# Patient Record
Sex: Male | Born: 1945 | Race: White | Hispanic: No | Marital: Married | Smoking: Former smoker
Health system: Southern US, Community
[De-identification: ages and names within clinical notes are randomized; demographics above are authoritative.]

## PROBLEM LIST (undated history)

## (undated) DIAGNOSIS — K219 Gastro-esophageal reflux disease without esophagitis: Secondary | ICD-10-CM

## (undated) DIAGNOSIS — M1990 Unspecified osteoarthritis, unspecified site: Secondary | ICD-10-CM

## (undated) DIAGNOSIS — A419 Sepsis, unspecified organism: Secondary | ICD-10-CM

## (undated) DIAGNOSIS — IMO0002 Reserved for concepts with insufficient information to code with codable children: Secondary | ICD-10-CM

## (undated) DIAGNOSIS — Z87442 Personal history of urinary calculi: Secondary | ICD-10-CM

## (undated) DIAGNOSIS — F0280 Dementia in other diseases classified elsewhere without behavioral disturbance: Secondary | ICD-10-CM

## (undated) DIAGNOSIS — I739 Peripheral vascular disease, unspecified: Secondary | ICD-10-CM

## (undated) DIAGNOSIS — I7300 Raynaud's syndrome without gangrene: Secondary | ICD-10-CM

## (undated) DIAGNOSIS — R7302 Impaired glucose tolerance (oral): Secondary | ICD-10-CM

## (undated) DIAGNOSIS — M6289 Other specified disorders of muscle: Secondary | ICD-10-CM

## (undated) DIAGNOSIS — G309 Alzheimer's disease, unspecified: Secondary | ICD-10-CM

## (undated) DIAGNOSIS — N390 Urinary tract infection, site not specified: Secondary | ICD-10-CM

## (undated) DIAGNOSIS — I639 Cerebral infarction, unspecified: Secondary | ICD-10-CM

## (undated) DIAGNOSIS — N183 Chronic kidney disease, stage 3 (moderate): Secondary | ICD-10-CM

## (undated) DIAGNOSIS — I10 Essential (primary) hypertension: Secondary | ICD-10-CM

## (undated) DIAGNOSIS — I719 Aortic aneurysm of unspecified site, without rupture: Secondary | ICD-10-CM

## (undated) DIAGNOSIS — C801 Malignant (primary) neoplasm, unspecified: Secondary | ICD-10-CM

## (undated) DIAGNOSIS — J189 Pneumonia, unspecified organism: Secondary | ICD-10-CM

## (undated) DIAGNOSIS — E7800 Pure hypercholesterolemia, unspecified: Secondary | ICD-10-CM

## (undated) DIAGNOSIS — I82432 Acute embolism and thrombosis of left popliteal vein: Secondary | ICD-10-CM

## (undated) HISTORY — DX: Aortic aneurysm of unspecified site, without rupture: I71.9

## (undated) HISTORY — DX: Essential (primary) hypertension: I10

## (undated) HISTORY — DX: Unspecified osteoarthritis, unspecified site: M19.90

## (undated) HISTORY — PX: TONSILLECTOMY: SUR1361

## (undated) HISTORY — PX: PERIPHERALLY INSERTED CENTRAL CATHETER INSERTION: SHX2221

## (undated) HISTORY — DX: Pure hypercholesterolemia, unspecified: E78.00

## (undated) HISTORY — DX: Pneumonia, unspecified organism: J18.9

## (undated) HISTORY — DX: Reserved for concepts with insufficient information to code with codable children: IMO0002

## (undated) HISTORY — PX: UMBILICAL HERNIA REPAIR: SHX196

## (undated) HISTORY — PX: HERNIA REPAIR: SHX51

## (undated) HISTORY — DX: Malignant (primary) neoplasm, unspecified: C80.1

## (undated) HISTORY — DX: Chronic kidney disease, stage 3 (moderate): N18.3

---

## 2004-09-15 ENCOUNTER — Ambulatory Visit: Payer: Self-pay | Admitting: Family Medicine

## 2009-09-26 HISTORY — PX: ABDOMINAL AORTIC ANEURYSM REPAIR: SUR1152

## 2010-08-26 ENCOUNTER — Inpatient Hospital Stay: Payer: Self-pay | Admitting: Vascular Surgery

## 2010-09-26 DIAGNOSIS — C801 Malignant (primary) neoplasm, unspecified: Secondary | ICD-10-CM

## 2010-09-26 HISTORY — DX: Malignant (primary) neoplasm, unspecified: C80.1

## 2011-01-06 IMAGING — CT CT CHEST-ABD-PELV W/ CM
1 of 2 series · 13 of 32 positions shown, 18 images · IV contrast (APPLIED)
Comparison: none

REASON FOR EXAM: (1) hypotension abd pain; (2) hypotension abd pain
COMMENTS:

[Series 10: soft tissue · axial · 0.90mm/px · z∈[+82,+706]mm · 13 of 236 slices shown, 18 images]
[im 14/236  soft-tissue]
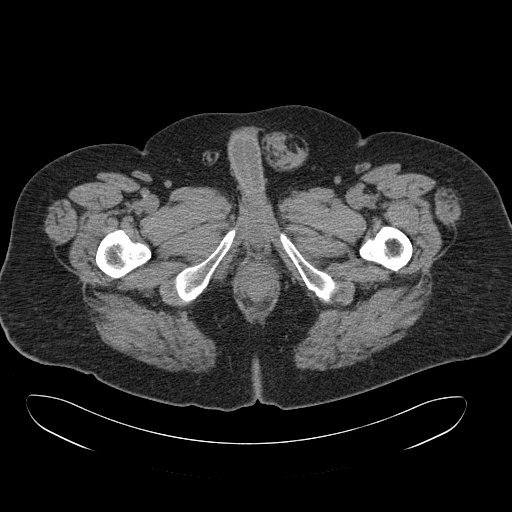
[im 14/236  bone]
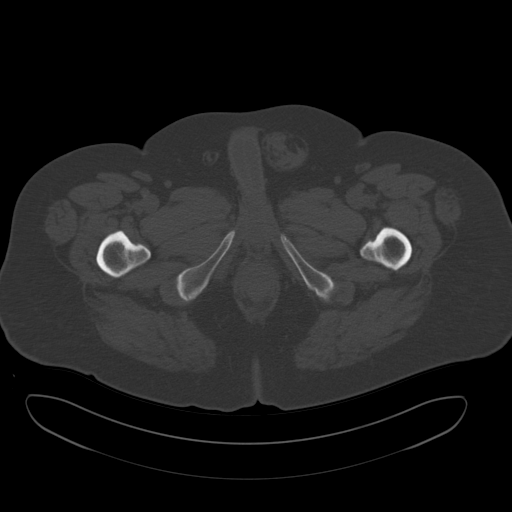
[im 40/236  soft-tissue]
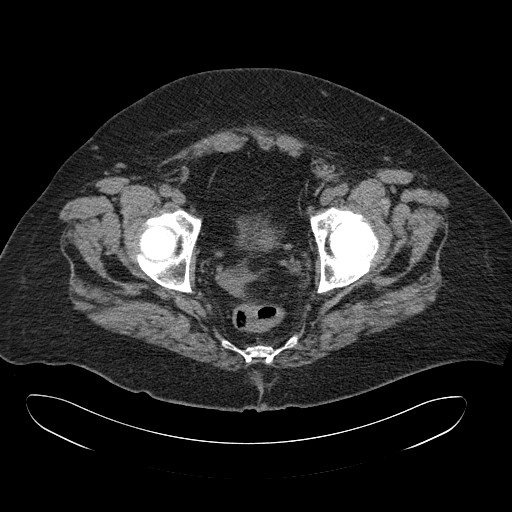
[im 53/236  soft-tissue]
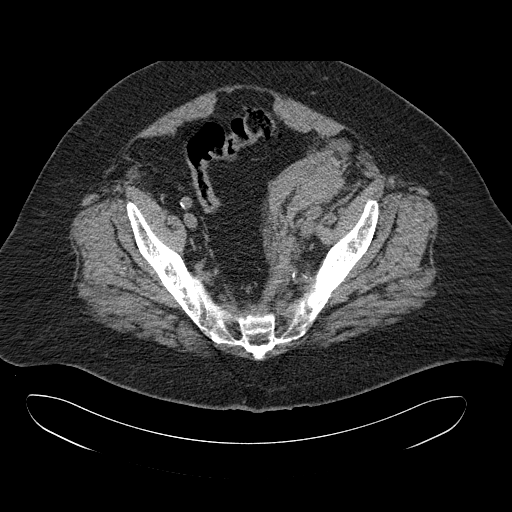
[im 66/236  soft-tissue]
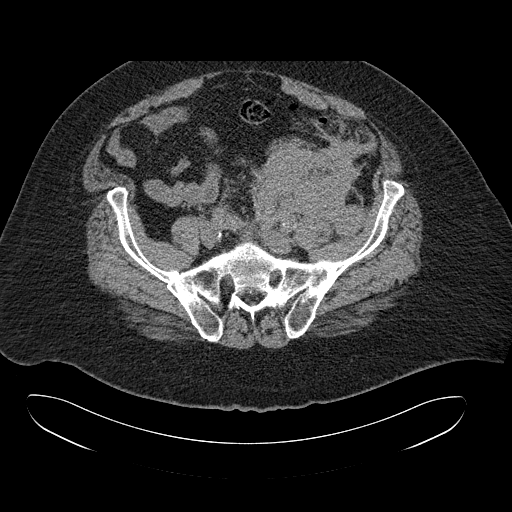
[im 92/236  soft-tissue]
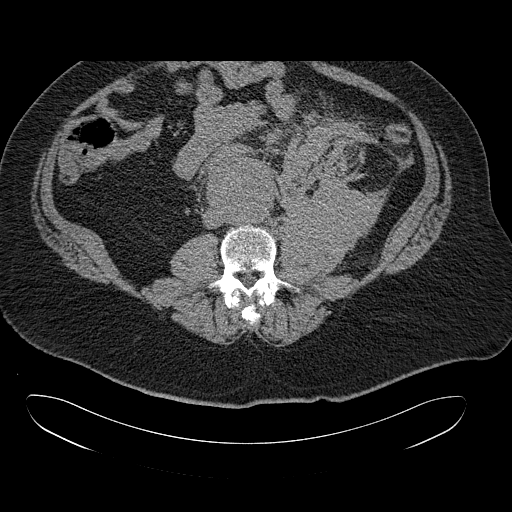
[im 105/236  soft-tissue]
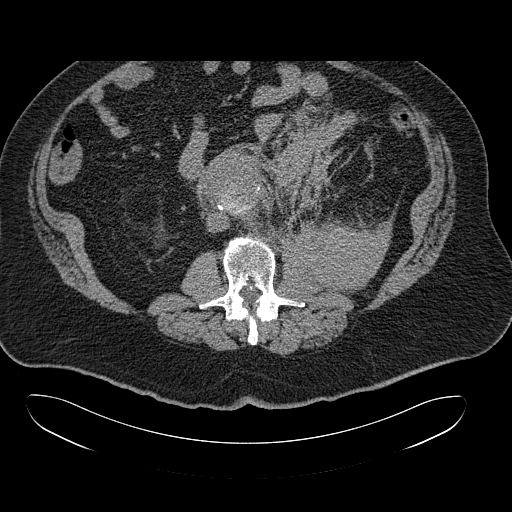
[im 131/236  soft-tissue]
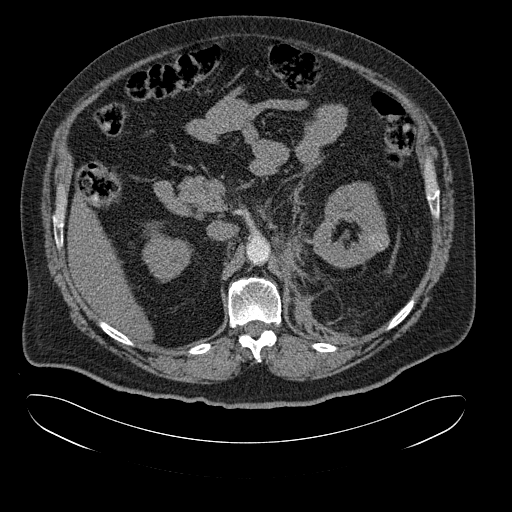
[im 144/236  soft-tissue]
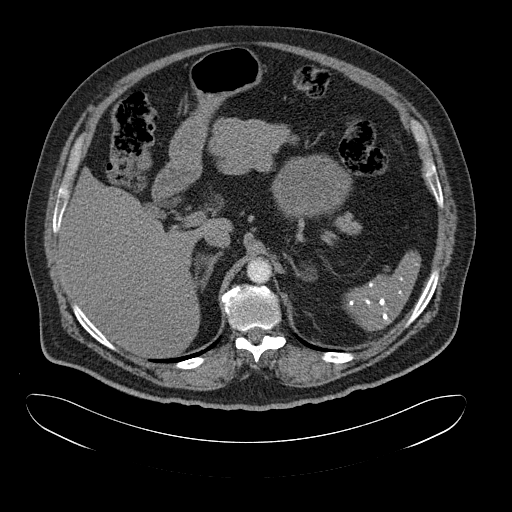
[im 170/236  soft-tissue]
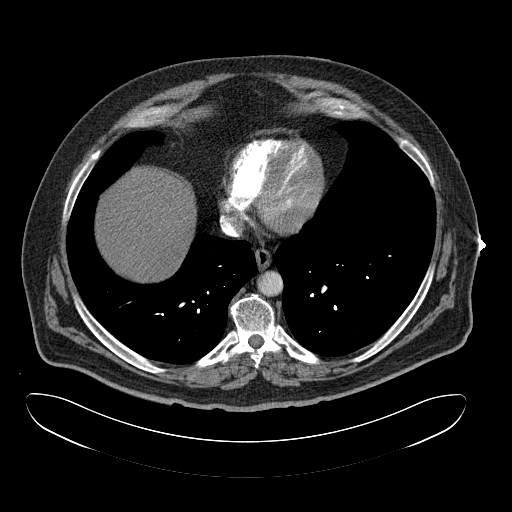
[im 170/236  bone]
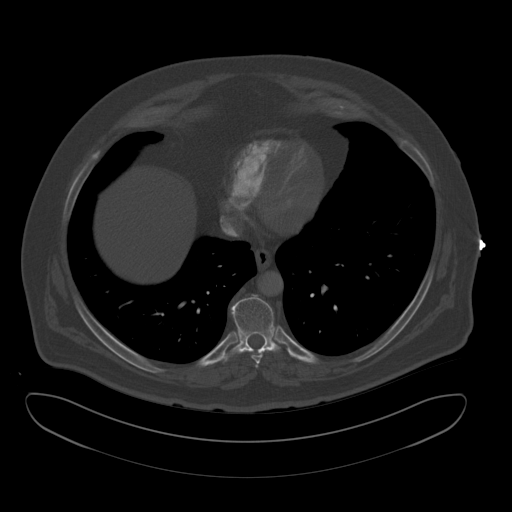
[im 183/236  soft-tissue]
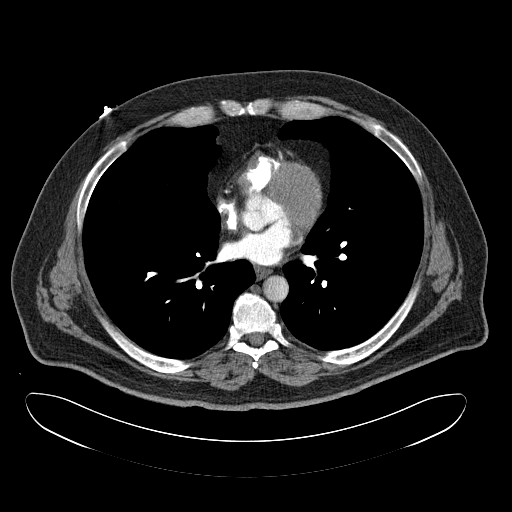
[im 183/236  lung]
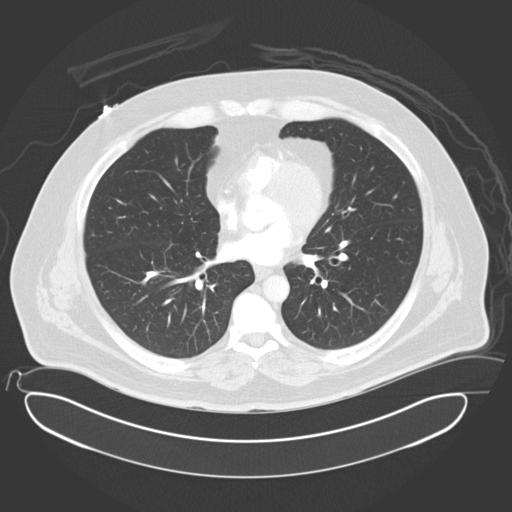
[im 196/236  soft-tissue]
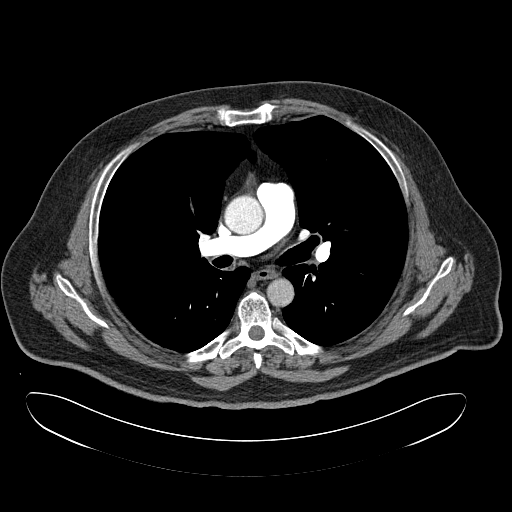
[im 196/236  lung]
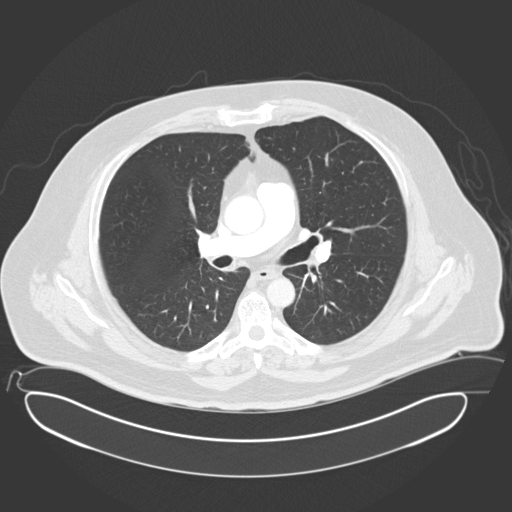
[im 209/236  lung]
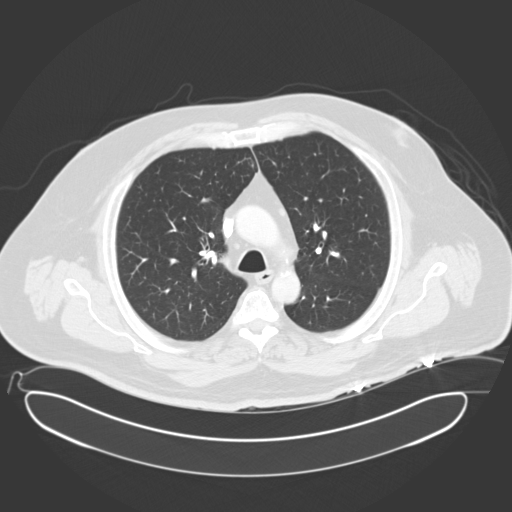
[im 222/236  soft-tissue]
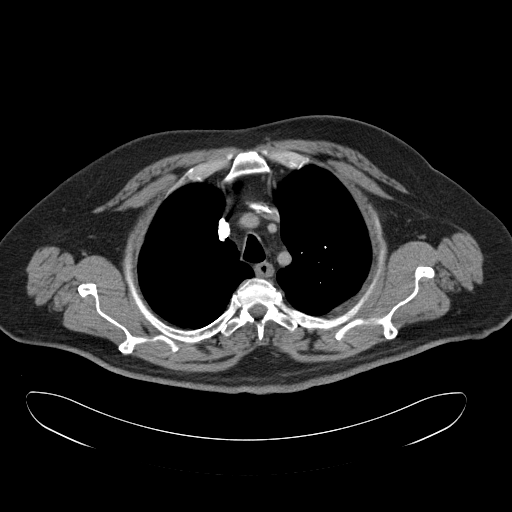
[im 222/236  lung]
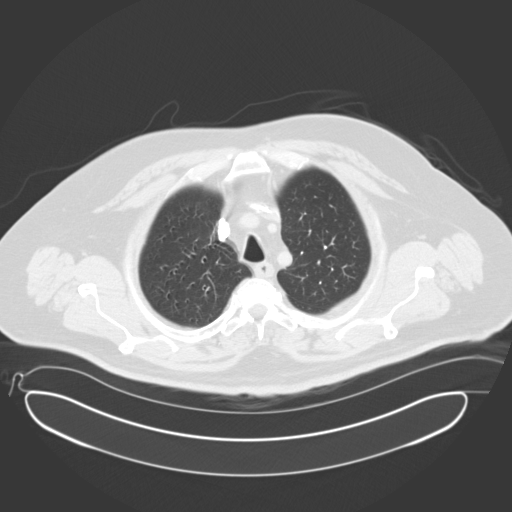

[13 of 32 positions shown; findings below may reference images not displayed]

PROCEDURE:     CT  - CT CHEST ABDOMEN AND PELVIS W  - August 26, 2010 [DATE]

RESULT:     Axial CT scanning was performed through the chest, abdomen, and
pelvis at 5 mm intervals and slice thicknesses following intravenous
administration of 100 cc of [id number]. Review of multiplanar reconstructed
images was performed separately on the VIA monitor.

CT scan of the chest: The caliber of the thoracic aorta is normal. No false
lumen is identified. The cardiac chambers are normal in size. There is no
pleural nor pericardial effusion. There is no mediastinal or hilar
lymphadenopathy. The pulmonary parenchyma exhibits no abnormal masses or
nodules or evidence of infiltrate. The thoracic vertebral bodies are
preserved in height.
CONCLUSION: I do not see acute abnormality within the thorax.

CT scan of the abdomen and pelvis: There is a 6 cm diameter leaking
infrarenal abdominal aortic aneurysm. There is a considerable amount of
noncontrasted blood within the retroperitoneum on the left. This lies
anterior to the psoas muscle and medial and posterior to the left kidney
displacing the left kidney slightly anteriorly. The blood extends toward the
right anterior to the aneurysm and upper abdominal aorta. The aneurysm
extends to the bifurcation and involves the very proximal portion of both
iliac arteries. More distally the iliac arteries are normal in caliber.

The liver exhibits no focal mass or ductal dilation. There is a tiny stone
in the gallbladder neck appear There are numerous calcifications within the
normal sized spleen. The stomach, pancreas, and adrenal glands are within
the limits of normal. No acute abnormality of the bowel is demonstrated. The
prostate gland and collapsed urinary bladder are grossly normal. There is a
small amount of fluid in the pelvis. There is a left inguinal hernia which
contains fluid which appears to have tracked from the retroperitoneum into
the hernia extending into the scrotum.

The kidneys still enhance well. The origin of the renal arteries is just
above the aneurysm. There is a hyperdense focus in the midpole of the cortex
of the left kidney with Hounsfield measurement of +58. On the right there
are hypodensities in the midpole one of which measures 2 cm in greatest
dimension and has Hounsfield measurement of +3.
IMPRESSION: 1. There is a leaking infrarenal abdominal aortic aneurysm measuring 6 cm in
diameter. Fluid is present in the retroperitoneum and extends into the
pelvis and into the right inguinal canal via a hernia.
2. The kidneys still enhance reasonably well. There is a hypodense 1.8 cm
diameter focus in the cortex of the left kidney. There are numerous
nonobstructing parenchymal or collecting system calcifications in the left
kidney. There is a tiny gallstone near the gallbladder neck.
3. I do not see acute abnormality elsewhere within the abdomen or pelvis.
4. I see no acute abnormality within the thorax.

A preliminary report was called by me directly to Dr. Jumper in the emergency
department at approximately [DATE] on 26 August, 2010.

## 2011-08-10 ENCOUNTER — Ambulatory Visit: Payer: Self-pay | Admitting: Urology

## 2011-08-24 ENCOUNTER — Ambulatory Visit: Payer: Self-pay | Admitting: Urology

## 2011-08-24 DIAGNOSIS — Z01810 Encounter for preprocedural cardiovascular examination: Secondary | ICD-10-CM

## 2011-08-29 ENCOUNTER — Ambulatory Visit (INDEPENDENT_AMBULATORY_CARE_PROVIDER_SITE_OTHER): Payer: Managed Care, Other (non HMO) | Admitting: Cardiovascular Disease

## 2011-08-29 ENCOUNTER — Encounter: Payer: Self-pay | Admitting: Cardiovascular Disease

## 2011-08-29 VITALS — BP 122/68 | HR 84 | Ht 71.0 in | Wt 254.5 lb

## 2011-08-29 DIAGNOSIS — I169 Hypertensive crisis, unspecified: Secondary | ICD-10-CM | POA: Insufficient documentation

## 2011-08-29 DIAGNOSIS — E785 Hyperlipidemia, unspecified: Secondary | ICD-10-CM

## 2011-08-29 DIAGNOSIS — Z01810 Encounter for preprocedural cardiovascular examination: Secondary | ICD-10-CM

## 2011-08-29 DIAGNOSIS — E7800 Pure hypercholesterolemia, unspecified: Secondary | ICD-10-CM | POA: Insufficient documentation

## 2011-08-29 DIAGNOSIS — R5381 Other malaise: Secondary | ICD-10-CM

## 2011-08-29 DIAGNOSIS — I739 Peripheral vascular disease, unspecified: Secondary | ICD-10-CM

## 2011-08-29 DIAGNOSIS — R9431 Abnormal electrocardiogram [ECG] [EKG]: Secondary | ICD-10-CM | POA: Insufficient documentation

## 2011-08-29 DIAGNOSIS — I10 Essential (primary) hypertension: Secondary | ICD-10-CM

## 2011-08-29 DIAGNOSIS — Z87891 Personal history of nicotine dependence: Secondary | ICD-10-CM

## 2011-08-29 NOTE — Progress Notes (Signed)
Patient ID: Nathan Rivera, male    DOB: Mar 25, 1946, 65 y.o.   MRN: [id number]  HPI Comments: Nathan Rivera is a very pleasant 65 year old gentleman with a history of peripheral vascular disease, aorto bifemoral endograft placement for AAA, history of hematuria and bladder infection, smoking for 45 years, chronic back and knee pain with no known coronary artery disease, history of renal dysfunction followed by Dr. Cherylann Ratel, who presents by referral for preoperative evaluation prior to cystoscopy.  He reports that he is very active at baseline, plays golf 3 days per week. He is able to do most of his chores. He is retired and his wife continues to work and he maintains his house, garden and mows 2 acres at a time. He denies any significant shortness of breath or chest pain. He does have a vague sense of malaise which he attributes to his kidneys, bladder, possibly a retained stone though he is unsure. He is hoping a cystoscopy we'll provide some clearance.   He reports having recent ABIs were within normal limits. This was performed by Dr. Lorretta Harp. He has not had a recent stress test.  EKG shows normal sinus rhythm with rate 84 beats per minute with no significant ST or T wave changes, poor R wave progression through the anterior precordial leads. EKG today is unchanged from prior EKGs performed in the hospital as well as last year in December 2011   Outpatient Encounter Prescriptions as of 08/29/2011  Medication Sig Dispense Refill  . amLODipine-olmesartan (AZOR) 5-40 MG per tablet Take 1 tablet by mouth daily.        . simvastatin (ZOCOR) 20 MG tablet Take 20 mg by mouth at bedtime.        Marland Kitchen DISCONTD: etodolac (LODINE) 500 MG tablet Take 500 mg by mouth 2 (two) times daily.           Review of Systems  Constitutional: Negative.   HENT: Negative.   Eyes: Negative.   Respiratory: Negative.   Cardiovascular: Negative.   Gastrointestinal: Negative.   Musculoskeletal: Positive for back pain and  arthralgias.  Skin: Negative.   Neurological: Negative.   Hematological: Negative.   Psychiatric/Behavioral: Negative.   All other systems reviewed and are negative.    BP 122/68  Pulse 84  Ht 5\' 11"  (1.803 m)  Wt 254 lb 8 oz (115.44 kg)  BMI 35.50 kg/m2   Physical Exam  Nursing note and vitals reviewed. Constitutional: He is oriented to person, place, and time. He appears well-developed and well-nourished.  HENT:  Head: Normocephalic.  Nose: Nose normal.  Mouth/Throat: Oropharynx is clear and moist.  Eyes: Conjunctivae are normal. Pupils are equal, round, and reactive to light.  Neck: Normal range of motion. Neck supple. No JVD present.  Cardiovascular: Normal rate, regular rhythm, S1 normal, S2 normal, normal heart sounds and intact distal pulses.  Exam reveals no gallop and no friction rub.   No murmur heard. Pulmonary/Chest: Effort normal and breath sounds normal. No respiratory distress. He has no wheezes. He has no rales. He exhibits no tenderness.  Abdominal: Soft. Bowel sounds are normal. He exhibits no distension. There is no tenderness.  Musculoskeletal: Normal range of motion. He exhibits no edema and no tenderness.  Lymphadenopathy:    He has no cervical adenopathy.  Neurological: He is alert and oriented to person, place, and time. Coordination normal.  Skin: Skin is warm and dry. No rash noted. No erythema.  Psychiatric: He has a normal mood and affect. His behavior  is normal. Judgment and thought content normal.           Assessment and Plan

## 2011-08-29 NOTE — Assessment & Plan Note (Signed)
EKG is relatively unchanged from prior studies. Poor R-wave progression through the precordial disease more likely than old MI.

## 2011-08-29 NOTE — Assessment & Plan Note (Signed)
History of aortobifem stenting per the patient. We have encouraged continued smoking cessation and cholesterol control.

## 2011-08-29 NOTE — Assessment & Plan Note (Signed)
Blood pressure is well controlled on today's visit. No changes made to the medications. 

## 2011-08-29 NOTE — Assessment & Plan Note (Signed)
Given his peripheral vascular disease, goal LDL should be less than 70.

## 2011-08-29 NOTE — Patient Instructions (Signed)
You are doing well. No medication changes were made.  Please call us if you have new issues that need to be addressed before your next appt.    

## 2011-08-29 NOTE — Assessment & Plan Note (Signed)
Given that he has a reasonable exercise tolerance with no symptoms, no further workup is needed at this time prior to his cystoscopy. He plays golf, maintains his property with no symptoms and would be expected to handle the stress of surgery without complications.

## 2011-09-07 ENCOUNTER — Ambulatory Visit: Payer: Self-pay | Admitting: Urology

## 2012-01-11 ENCOUNTER — Ambulatory Visit: Payer: Self-pay | Admitting: Urology

## 2012-01-18 ENCOUNTER — Ambulatory Visit: Payer: Self-pay | Admitting: Urology

## 2012-01-20 LAB — PATHOLOGY REPORT

## 2012-02-22 ENCOUNTER — Ambulatory Visit: Payer: Self-pay | Admitting: Family Medicine

## 2012-09-26 DIAGNOSIS — N183 Chronic kidney disease, stage 3 unspecified: Secondary | ICD-10-CM

## 2012-09-26 HISTORY — DX: Chronic kidney disease, stage 3 unspecified: N18.30

## 2013-02-04 ENCOUNTER — Ambulatory Visit: Payer: Self-pay | Admitting: Vascular Surgery

## 2013-07-27 DIAGNOSIS — A419 Sepsis, unspecified organism: Secondary | ICD-10-CM

## 2013-07-27 HISTORY — DX: Sepsis, unspecified organism: A41.9

## 2013-07-27 HISTORY — PX: NEPHROSTOMY: SHX1014

## 2013-08-17 ENCOUNTER — Inpatient Hospital Stay: Payer: Self-pay | Admitting: Student

## 2013-08-17 DIAGNOSIS — N390 Urinary tract infection, site not specified: Secondary | ICD-10-CM

## 2013-08-17 HISTORY — DX: Urinary tract infection, site not specified: N39.0

## 2013-08-17 LAB — COMPREHENSIVE METABOLIC PANEL
Albumin: 2 g/dL — ABNORMAL LOW (ref 3.4–5.0)
Alkaline Phosphatase: 495 U/L — ABNORMAL HIGH
Anion Gap: 13 (ref 7–16)
Calcium, Total: 8.7 mg/dL (ref 8.5–10.1)
EGFR (African American): 7 — ABNORMAL LOW
EGFR (Non-African Amer.): 6 — ABNORMAL LOW
Potassium: 5.6 mmol/L — ABNORMAL HIGH (ref 3.5–5.1)
Total Protein: 6.5 g/dL (ref 6.4–8.2)

## 2013-08-17 LAB — URINALYSIS, COMPLETE
Bilirubin,UR: NEGATIVE
Glucose,UR: NEGATIVE mg/dL (ref 0–75)
Ketone: NEGATIVE
Ph: 5 (ref 4.5–8.0)
Protein: 100
RBC,UR: 895 /HPF (ref 0–5)
Squamous Epithelial: NONE SEEN
WBC UR: 1471 /HPF (ref 0–5)

## 2013-08-17 LAB — CBC WITH DIFFERENTIAL/PLATELET
Basophil #: 0 10*3/uL (ref 0.0–0.1)
Basophil %: 0.1 %
HGB: 13 g/dL (ref 13.0–18.0)
Lymphocyte %: 4 %
Monocyte #: 0.1 x10 3/mm — ABNORMAL LOW (ref 0.2–1.0)
Monocyte %: 0.3 %
Neutrophil %: 95.5 %
Platelet: 182 10*3/uL (ref 150–440)
RDW: 16.7 % — ABNORMAL HIGH (ref 11.5–14.5)

## 2013-08-17 LAB — TROPONIN I: Troponin-I: <0.02 ng/mL

## 2013-08-18 ENCOUNTER — Inpatient Hospital Stay (HOSPITAL_COMMUNITY)
Admission: AD | Admit: 2013-08-18 | Discharge: 2013-09-04 | DRG: 853 | Disposition: A | Discharge status: Home Health | Payer: Medicare Other | Source: Other Acute Inpatient Hospital | Attending: Internal Medicine | Admitting: Internal Medicine

## 2013-08-18 ENCOUNTER — Ambulatory Visit: Payer: Self-pay | Admitting: Urology

## 2013-08-18 ENCOUNTER — Encounter (HOSPITAL_COMMUNITY): Payer: Self-pay | Admitting: *Deleted

## 2013-08-18 ENCOUNTER — Inpatient Hospital Stay (HOSPITAL_COMMUNITY): Payer: Medicare Other

## 2013-08-18 DIAGNOSIS — K429 Umbilical hernia without obstruction or gangrene: Secondary | ICD-10-CM | POA: Diagnosis present

## 2013-08-18 DIAGNOSIS — J811 Chronic pulmonary edema: Secondary | ICD-10-CM | POA: Diagnosis present

## 2013-08-18 DIAGNOSIS — N200 Calculus of kidney: Secondary | ICD-10-CM

## 2013-08-18 DIAGNOSIS — I959 Hypotension, unspecified: Secondary | ICD-10-CM | POA: Insufficient documentation

## 2013-08-18 DIAGNOSIS — E871 Hypo-osmolality and hyponatremia: Secondary | ICD-10-CM | POA: Diagnosis not present

## 2013-08-18 DIAGNOSIS — R000 Tachycardia, unspecified: Secondary | ICD-10-CM | POA: Diagnosis not present

## 2013-08-18 DIAGNOSIS — Z01810 Encounter for preprocedural cardiovascular examination: Secondary | ICD-10-CM

## 2013-08-18 DIAGNOSIS — N1330 Unspecified hydronephrosis: Secondary | ICD-10-CM | POA: Diagnosis present

## 2013-08-18 DIAGNOSIS — I129 Hypertensive chronic kidney disease with stage 1 through stage 4 chronic kidney disease, or unspecified chronic kidney disease: Secondary | ICD-10-CM | POA: Diagnosis present

## 2013-08-18 DIAGNOSIS — F17200 Nicotine dependence, unspecified, uncomplicated: Secondary | ICD-10-CM | POA: Diagnosis present

## 2013-08-18 DIAGNOSIS — N183 Chronic kidney disease, stage 3 unspecified: Secondary | ICD-10-CM | POA: Diagnosis present

## 2013-08-18 DIAGNOSIS — R5381 Other malaise: Secondary | ICD-10-CM

## 2013-08-18 DIAGNOSIS — R6520 Severe sepsis without septic shock: Secondary | ICD-10-CM

## 2013-08-18 DIAGNOSIS — K560 Paralytic ileus: Secondary | ICD-10-CM | POA: Diagnosis not present

## 2013-08-18 DIAGNOSIS — Z79899 Other long term (current) drug therapy: Secondary | ICD-10-CM

## 2013-08-18 DIAGNOSIS — E46 Unspecified protein-calorie malnutrition: Secondary | ICD-10-CM | POA: Diagnosis present

## 2013-08-18 DIAGNOSIS — M1990 Unspecified osteoarthritis, unspecified site: Secondary | ICD-10-CM | POA: Diagnosis present

## 2013-08-18 DIAGNOSIS — Z418 Encounter for other procedures for purposes other than remedying health state: Secondary | ICD-10-CM

## 2013-08-18 DIAGNOSIS — E785 Hyperlipidemia, unspecified: Secondary | ICD-10-CM

## 2013-08-18 DIAGNOSIS — E860 Dehydration: Secondary | ICD-10-CM

## 2013-08-18 DIAGNOSIS — E875 Hyperkalemia: Secondary | ICD-10-CM | POA: Diagnosis not present

## 2013-08-18 DIAGNOSIS — E872 Acidosis, unspecified: Secondary | ICD-10-CM | POA: Diagnosis present

## 2013-08-18 DIAGNOSIS — Z87442 Personal history of urinary calculi: Secondary | ICD-10-CM

## 2013-08-18 DIAGNOSIS — I10 Essential (primary) hypertension: Secondary | ICD-10-CM | POA: Diagnosis present

## 2013-08-18 DIAGNOSIS — K265 Chronic or unspecified duodenal ulcer with perforation: Secondary | ICD-10-CM

## 2013-08-18 DIAGNOSIS — N179 Acute kidney failure, unspecified: Secondary | ICD-10-CM | POA: Diagnosis present

## 2013-08-18 DIAGNOSIS — R7881 Bacteremia: Secondary | ICD-10-CM

## 2013-08-18 DIAGNOSIS — N201 Calculus of ureter: Secondary | ICD-10-CM | POA: Diagnosis present

## 2013-08-18 DIAGNOSIS — D72829 Elevated white blood cell count, unspecified: Secondary | ICD-10-CM

## 2013-08-18 DIAGNOSIS — N151 Renal and perinephric abscess: Secondary | ICD-10-CM

## 2013-08-18 DIAGNOSIS — N12 Tubulo-interstitial nephritis, not specified as acute or chronic: Secondary | ICD-10-CM | POA: Diagnosis present

## 2013-08-18 DIAGNOSIS — Z8551 Personal history of malignant neoplasm of bladder: Secondary | ICD-10-CM

## 2013-08-18 DIAGNOSIS — E8779 Other fluid overload: Secondary | ICD-10-CM | POA: Diagnosis not present

## 2013-08-18 DIAGNOSIS — J9601 Acute respiratory failure with hypoxia: Secondary | ICD-10-CM

## 2013-08-18 DIAGNOSIS — A419 Sepsis, unspecified organism: Secondary | ICD-10-CM

## 2013-08-18 DIAGNOSIS — E7800 Pure hypercholesterolemia, unspecified: Secondary | ICD-10-CM | POA: Diagnosis present

## 2013-08-18 DIAGNOSIS — E870 Hyperosmolality and hypernatremia: Secondary | ICD-10-CM | POA: Diagnosis not present

## 2013-08-18 DIAGNOSIS — Z113 Encounter for screening for infections with a predominantly sexual mode of transmission: Secondary | ICD-10-CM

## 2013-08-18 DIAGNOSIS — Z823 Family history of stroke: Secondary | ICD-10-CM

## 2013-08-18 DIAGNOSIS — I169 Hypertensive crisis, unspecified: Secondary | ICD-10-CM | POA: Diagnosis present

## 2013-08-18 DIAGNOSIS — R579 Shock, unspecified: Secondary | ICD-10-CM

## 2013-08-18 DIAGNOSIS — K9189 Other postprocedural complications and disorders of digestive system: Secondary | ICD-10-CM

## 2013-08-18 DIAGNOSIS — K567 Ileus, unspecified: Secondary | ICD-10-CM

## 2013-08-18 DIAGNOSIS — A499 Bacterial infection, unspecified: Secondary | ICD-10-CM

## 2013-08-18 DIAGNOSIS — B9620 Unspecified Escherichia coli [E. coli] as the cause of diseases classified elsewhere: Secondary | ICD-10-CM

## 2013-08-18 DIAGNOSIS — I739 Peripheral vascular disease, unspecified: Secondary | ICD-10-CM | POA: Diagnosis present

## 2013-08-18 DIAGNOSIS — Z2989 Encounter for other specified prophylactic measures: Secondary | ICD-10-CM

## 2013-08-18 DIAGNOSIS — R9431 Abnormal electrocardiogram [ECG] [EKG]: Secondary | ICD-10-CM

## 2013-08-18 DIAGNOSIS — J9600 Acute respiratory failure, unspecified whether with hypoxia or hypercapnia: Secondary | ICD-10-CM | POA: Diagnosis not present

## 2013-08-18 DIAGNOSIS — A4151 Sepsis due to Escherichia coli [E. coli]: Principal | ICD-10-CM | POA: Diagnosis present

## 2013-08-18 DIAGNOSIS — F29 Unspecified psychosis not due to a substance or known physiological condition: Secondary | ICD-10-CM | POA: Diagnosis present

## 2013-08-18 DIAGNOSIS — D62 Acute posthemorrhagic anemia: Secondary | ICD-10-CM | POA: Diagnosis not present

## 2013-08-18 LAB — URINALYSIS, ROUTINE W REFLEX MICROSCOPIC
Glucose, UA: NEGATIVE mg/dL
Ketones, ur: NEGATIVE mg/dL
Nitrite: POSITIVE — AB
Protein, ur: 30 mg/dL — AB
Urobilinogen, UA: 1 mg/dL (ref 0.0–1.0)
pH: 5.5 (ref 5.0–8.0)

## 2013-08-18 LAB — GLUCOSE, CAPILLARY
Glucose-Capillary: 63 mg/dL — ABNORMAL LOW (ref 70–99)
Glucose-Capillary: 76 mg/dL (ref 70–99)

## 2013-08-18 LAB — URINE MICROSCOPIC-ADD ON

## 2013-08-18 LAB — COMPREHENSIVE METABOLIC PANEL
Albumin: 1.4 g/dL — ABNORMAL LOW (ref 3.4–5.0)
Anion Gap: 9 (ref 7–16)
BUN: 127 mg/dL — ABNORMAL HIGH (ref 7–18)
Calcium, Total: 8.3 mg/dL — ABNORMAL LOW (ref 8.5–10.1)
Co2: 14 mmol/L — ABNORMAL LOW (ref 21–32)
Creatinine: 6.81 mg/dL — ABNORMAL HIGH (ref 0.60–1.30)
Osmolality: 312 (ref 275–301)
SGOT(AST): 71 U/L — ABNORMAL HIGH (ref 15–37)
SGPT (ALT): 57 U/L (ref 12–78)
Total Protein: 5.8 g/dL — ABNORMAL LOW (ref 6.4–8.2)

## 2013-08-18 LAB — CBC WITH DIFFERENTIAL/PLATELET
Basophil %: 0.1 %
Eosinophil #: 0.2 10*3/uL (ref 0.0–0.7)
HGB: 10.3 g/dL — ABNORMAL LOW (ref 13.0–18.0)
Lymphocyte #: 2.3 10*3/uL (ref 1.0–3.6)
MCHC: 33.5 g/dL (ref 32.0–36.0)
Monocyte #: 1.1 x10 3/mm — ABNORMAL HIGH (ref 0.2–1.0)
Monocyte %: 2.4 %
Neutrophil #: 40 10*3/uL — ABNORMAL HIGH (ref 1.4–6.5)
Neutrophil %: 91.6 %
Platelet: 161 10*3/uL (ref 150–440)
RBC: 3.59 10*6/uL — ABNORMAL LOW (ref 4.40–5.90)
RDW: 16 % — ABNORMAL HIGH (ref 11.5–14.5)
WBC: 43.6 10*3/uL — ABNORMAL HIGH (ref 3.8–10.6)

## 2013-08-18 LAB — TYPE AND SCREEN: Antibody Screen: NEGATIVE

## 2013-08-18 LAB — CBC
Hemoglobin: 10.9 g/dL — ABNORMAL LOW (ref 13.0–17.0)
MCH: 29.3 pg (ref 26.0–34.0)
MCHC: 34.4 g/dL (ref 30.0–36.0)
MCV: 85.2 fL (ref 78.0–100.0)
RBC: 3.72 MIL/uL — ABNORMAL LOW (ref 4.22–5.81)

## 2013-08-18 LAB — BASIC METABOLIC PANEL
CO2: 10 mEq/L — CL (ref 19–32)
Calcium: 8.1 mg/dL — ABNORMAL LOW (ref 8.4–10.5)
Chloride: 111 mEq/L (ref 96–112)
Creatinine, Ser: 5.23 mg/dL — ABNORMAL HIGH (ref 0.50–1.35)
GFR calc Af Amer: 12 mL/min — ABNORMAL LOW (ref >90)
Glucose, Bld: 81 mg/dL (ref 70–99)

## 2013-08-18 LAB — LACTIC ACID, PLASMA: Lactic Acid, Venous: 0.8 mmol/L (ref 0.5–2.2)

## 2013-08-18 LAB — FIBRINOGEN: Fibrinogen: 707 mg/dL — ABNORMAL HIGH (ref 204–475)

## 2013-08-18 LAB — PROTIME-INR
INR: 1.16 (ref 0.00–1.49)
Prothrombin Time: 14.6 seconds (ref 11.6–15.2)

## 2013-08-18 LAB — MRSA PCR SCREENING: MRSA by PCR: NEGATIVE

## 2013-08-18 LAB — CARBOXYHEMOGLOBIN
Methemoglobin: 1.5 % (ref 0.0–1.5)
O2 Saturation: 78.7 %
Total hemoglobin: 10.9 g/dL — ABNORMAL LOW (ref 13.5–18.0)

## 2013-08-18 MED ORDER — SODIUM BICARBONATE 8.4 % IV SOLN
50.0000 meq | Freq: Once | INTRAVENOUS | Status: AC
  Administered 2013-08-18: 50 meq via INTRAVENOUS
  Filled 2013-08-18: qty 50

## 2013-08-18 MED ORDER — SODIUM CHLORIDE 0.9 % IV BOLUS (SEPSIS)
1000.0000 mL | INTRAVENOUS | Status: DC | PRN
  Administered 2013-08-18: 500 mL via INTRAVENOUS
  Administered 2013-08-18: 1000 mL via INTRAVENOUS

## 2013-08-18 MED ORDER — SODIUM CHLORIDE 0.9 % IJ SOLN
3.0000 mL | Freq: Two times a day (BID) | INTRAMUSCULAR | Status: DC
  Administered 2013-08-18 – 2013-08-19 (×3): 3 mL via INTRAVENOUS

## 2013-08-18 MED ORDER — DEXTROSE 5 % IV SOLN: 1.0000 g | Freq: Two times a day (BID) | INTRAVENOUS | Status: DC

## 2013-08-18 MED ORDER — SODIUM CHLORIDE 0.9 % IV SOLN
INTRAVENOUS | Status: DC
  Administered 2013-08-18 – 2013-08-22 (×7): via INTRAVENOUS

## 2013-08-18 MED ORDER — SODIUM CHLORIDE 0.9 % IV BOLUS (SEPSIS): 1000.0000 mL | INTRAVENOUS | Status: DC | PRN

## 2013-08-18 MED ORDER — MEPERIDINE HCL 50 MG/ML IJ SOLN
INTRAMUSCULAR | Status: AC
  Filled 2013-08-18: qty 1

## 2013-08-18 MED ORDER — FENTANYL CITRATE 0.05 MG/ML IJ SOLN
INTRAMUSCULAR | Status: AC | PRN
  Administered 2013-08-18: 25 ug via INTRAVENOUS

## 2013-08-18 MED ORDER — ONDANSETRON HCL 4 MG/2ML IJ SOLN
4.0000 mg | Freq: Four times a day (QID) | INTRAMUSCULAR | Status: DC | PRN
  Administered 2013-08-19 – 2013-08-22 (×2): 4 mg via INTRAVENOUS
  Filled 2013-08-18: qty 2

## 2013-08-18 MED ORDER — ACETAMINOPHEN 650 MG RE SUPP: 650.0000 mg | Freq: Four times a day (QID) | RECTAL | Status: DC | PRN

## 2013-08-18 MED ORDER — DEXTROSE 5 % IV SOLN
1.0000 g | INTRAVENOUS | Status: DC
  Administered 2013-08-18 – 2013-08-19 (×2): 1 g via INTRAVENOUS
  Filled 2013-08-18 (×3): qty 1

## 2013-08-18 MED ORDER — DEXTROSE 50 % IV SOLN
12.5000 g | Freq: Once | INTRAVENOUS | Status: AC
  Administered 2013-08-18: 12.5 g via INTRAVENOUS

## 2013-08-18 MED ORDER — ONDANSETRON HCL 4 MG PO TABS: 4.0000 mg | ORAL_TABLET | Freq: Four times a day (QID) | ORAL | Status: DC | PRN

## 2013-08-18 MED ORDER — ACETAMINOPHEN 325 MG PO TABS: 650.0000 mg | ORAL_TABLET | Freq: Four times a day (QID) | ORAL | Status: DC | PRN

## 2013-08-18 MED ORDER — VASOPRESSIN 20 UNIT/ML IJ SOLN
0.0300 [IU]/min | INTRAVENOUS | Status: DC
  Administered 2013-08-18: 0.03 [IU]/min via INTRAVENOUS
  Filled 2013-08-18: qty 2.5

## 2013-08-18 MED ORDER — DEXTROSE 50 % IV SOLN
INTRAVENOUS | Status: AC
  Administered 2013-08-18: 12.5 g via INTRAVENOUS
  Filled 2013-08-18: qty 50

## 2013-08-18 MED ORDER — DEXTROSE 5 % IV SOLN
1.0000 g | INTRAVENOUS | Status: DC
  Filled 2013-08-18: qty 1

## 2013-08-18 MED ORDER — MIDAZOLAM HCL 2 MG/2ML IJ SOLN
INTRAMUSCULAR | Status: AC
  Filled 2013-08-18: qty 4

## 2013-08-18 MED ORDER — HYDROMORPHONE HCL PF 1 MG/ML IJ SOLN
1.0000 mg | INTRAMUSCULAR | Status: DC | PRN
  Administered 2013-08-18 – 2013-08-24 (×15): 1 mg via INTRAVENOUS
  Filled 2013-08-18 (×17): qty 1

## 2013-08-18 MED ORDER — FENTANYL CITRATE 0.05 MG/ML IJ SOLN
INTRAMUSCULAR | Status: AC
  Filled 2013-08-18: qty 4

## 2013-08-18 MED ORDER — MIDAZOLAM HCL 2 MG/2ML IJ SOLN
INTRAMUSCULAR | Status: AC | PRN
  Administered 2013-08-18: 1 mg via INTRAVENOUS

## 2013-08-18 MED ORDER — IOHEXOL 300 MG/ML  SOLN
50.0000 mL | Freq: Once | INTRAMUSCULAR | Status: AC | PRN
  Administered 2013-08-18: 1 mL

## 2013-08-18 MED ORDER — NOREPINEPHRINE BITARTRATE 1 MG/ML IJ SOLN
2.0000 ug/min | INTRAVENOUS | Status: DC
  Administered 2013-08-18: 20 ug/min via INTRAVENOUS
  Administered 2013-08-18: 7 ug/min via INTRAVENOUS
  Administered 2013-08-18: 12 ug/min via INTRAVENOUS
  Administered 2013-08-18: 19 ug/min via INTRAVENOUS
  Filled 2013-08-18 (×2): qty 4

## 2013-08-18 NOTE — ED Notes (Signed)
Second 250 bolus running. Called to floor to retreive vasoactive gtts

## 2013-08-18 NOTE — ED Notes (Signed)
Open fluid bolus of started

## 2013-08-18 NOTE — Procedures (Signed)
Procedure:  Left percutaneous nephrostomy Findings:  Left hydro.  Overt pus in collecting system.  Sample sent for culture. 10 Fr nephrostomy tube placed.  Attached to gravity bag.

## 2013-08-18 NOTE — Progress Notes (Signed)
Dr. Vassie Loll made aware of CVP 10, coox results, current Levo drip rate. Patient alert and oriented following commands on 4 L [place]. Will continue to monitor and maintain CVP goals according to orders.  Corliss Skains RN

## 2013-08-18 NOTE — Progress Notes (Signed)
Pt arrived to room [id number].  Denies pain. BP is 99/46 map 59 and pt is currently receiving 1L bolus.  No s/s of any acute distress at this time.  Paged Dr. Isidoro Donning who will be admitting patient.

## 2013-08-18 NOTE — Plan of Care (Signed)
Patient started on vasopressors for hypotension during the perc nephrostomy placement, severe acidosis. He has received about 4L IV fluids at [facility], another liter bolus here, then started at 125cc/h. d/w Dr Vassie Loll (PCCM), will assume care. TRH will sign off.     Bao Bazen M.D. [facility] 08/18/2013, 8:02 PM  Pager: [phone]

## 2013-08-18 NOTE — Consult Note (Signed)
PULMONARY  / CRITICAL CARE MEDICINE  Name: Nathan Rivera MRN: [id number] DOB: 1946/06/11    ADMISSION DATE:  08/18/2013 CONSULTATION DATE:  08/18/13  REFERRING MD :  Silvio Pate, MD PRIMARY SERVICE: PCCM  CHIEF COMPLAINT:   Left renal abscess post percutaneous nephrostomy and septic shock.  BRIEF PATIENT DESCRIPTION:  67 years old male with PMH relevant for HTN and CKD. Transferred from [place] with septic shock secondary to left renal abscess. Now status post left percutaneous nephrostomy.  SIGNIFICANT EVENTS / STUDIES:  - Post left percutaneous nephrostomy.  LINES / TUBES: - Left IJ CVC - Post left percutaneous nephrostomy. - Peripheral IV  CULTURES: Pending.  Gram negatives from [place]. No identification yet.   ANTIBIOTICS: Cefepime  HISTORY OF PRESENT ILLNESS:   67 years old male admitted on 08/17/13 to [place] with septic shock and UTI secondary to gram negatives. Found to have a left renal abscess. Transferred to [place] for percutaneous nephrostomy. Now post procedure hypotensive on Levophed. At the time of my exam the patient is lethargic but able to protect his airway. When awake is oriented but slightly confused. Does not have any specific symptoms. MAP's in the 80's on levophed.    PAST MEDICAL HISTORY :  Past Medical History  Diagnosis Date  . Arthritis   . Degenerative disc disease   . Degenerative joint disease   . Aortic aneurysm   . Pneumonia   . Hypercholesterolemia   . Hypertension   . CKD (chronic kidney disease), stage III    Past Surgical History  Procedure Laterality Date  . Abdominal aortic aneurysm repair  2011  . Hernia repair    . Tonsillectomy    . Umbilical hernia repair      50 yr ago   Prior to Admission medications   Medication Sig Start Date End Date Taking? Authorizing Provider  amLODipine (NORVASC) 5 MG tablet Take 5 mg by mouth daily.  06/28/13  Yes Historical Provider, MD  cholecalciferol (VITAMIN D) 1000 UNITS tablet Take  1,000 Units by mouth daily.   Yes Historical Provider, MD  etodolac (LODINE) 500 MG tablet Take 500 mg by mouth 2 (two) times daily.   Yes Historical Provider, MD  losartan (COZAAR) 100 MG tablet Take 100 mg by mouth daily.  06/28/13  Yes Historical Provider, MD  simvastatin (ZOCOR) 20 MG tablet Take 20 mg by mouth at bedtime.     Yes Historical Provider, MD   No Known Allergies  FAMILY HISTORY:  Family History  Problem Relation Age of Onset  . CVA Father   . Atrial fibrillation Sister    SOCIAL HISTORY:  reports that he has been smoking Cigarettes.  He has a 40 pack-year smoking history. He does not have any smokeless tobacco history on file. He reports that he drinks about 1.8 ounces of alcohol per week. He reports that he does not use illicit drugs.  REVIEW OF SYSTEMS:  All systems reviewed and found negative except for what I mentioned in the HPI.   SUBJECTIVE:   VITAL SIGNS: Temp:  [99.4 F (37.4 C)-100.8 F (38.2 C)] 99.4 F (37.4 C) (11/23 2000) Pulse Rate:  [100-108] 106 (11/23 2000) Resp:  [14-28] 17 (11/23 2000) BP: (75-118)/(41-70) 118/70 mmHg (11/23 2000) SpO2:  [92 %-96 %] 96 % (11/23 2000) Weight:  [242 lb 11.6 oz (110.1 kg)] 242 lb 11.6 oz (110.1 kg) (11/23 1612) HEMODYNAMICS:   VENTILATOR SETTINGS:   INTAKE / OUTPUT: Intake/Output     11/23 0701 -  11/24 0700   I.V. (mL/kg) 418.9 (3.8)   Total Intake(mL/kg) 418.9 (3.8)   Urine (mL/kg/hr) 740   Total Output 740   Net -321.1         PHYSICAL EXAMINATION: General: Ill appearance. Mild distress. Eyes: Anicteric sclerae. ENT: Dry mucous membranes. No thrush Lymph: No cervical, supraclavicular, or axillary lymphadenopathy. Heart: Normal S1, S2. No murmurs, rubs, or gallops appreciated. No bruits, equal pulses. Lungs: Normal excursion, no dullness to percussion. Good air movement bilaterally, without wheezes or crackles. Normal upper airway sounds without evidence of stridor. Abdomen: Abdomen soft,  non-tender and not distended, normoactive bowel sounds. No hepatosplenomegaly or masses. Left percutaneous nephrostomy. Musculoskeletal: No clubbing or synovitis. Skin: No rashes or lesions Neuro: No focal neurologic deficits. Slightly confused and lethargic.   LABS:  CBC  Recent Labs Lab 08/18/13 1657  WBC 11.1*  HGB 10.9*  HCT 31.7*  PLT 151   Coag's  Recent Labs Lab 08/18/13 1657  INR 1.16   BMET  Recent Labs Lab 08/18/13 1657  NA 135  K 5.1  CL 111  CO2 10*  BUN 117*  CREATININE 5.23*  GLUCOSE 81   Electrolytes  Recent Labs Lab 08/18/13 1657  CALCIUM 8.1*   Sepsis Markers No results found for this basename: LATICACIDVEN, PROCALCITON, O2SATVEN,  in the last 168 hours ABG No results found for this basename: PHART, PCO2ART, PO2ART,  in the last 168 hours Liver Enzymes No results found for this basename: AST, ALT, ALKPHOS, BILITOT, ALBUMIN,  in the last 168 hours Cardiac Enzymes No results found for this basename: TROPONINI, PROBNP,  in the last 168 hours Glucose  Recent Labs Lab 08/18/13 1606 08/18/13 1637 08/18/13 1950 08/18/13 2011  GLUCAP 63* 96 42* 76    Imaging Dg Chest Port 1 View  08/18/2013   CLINICAL DATA:  Central line placement  EXAM: PORTABLE CHEST - 1 VIEW  COMPARISON:  08/17/2013  FINDINGS: Left internal jugular central line has been placed with tip over the anticipated position of the junction of the brachiocephalic vein and superior vena cava. No pneumothorax. Heart size is normal. Lungs are clear.  IMPRESSION: Central line as described above with no pneumothorax   Electronically Signed   By: Esperanza Heir M.D.   On: 08/18/2013 20:58     CXR:  No parenchymal lung infiltrates.  ASSESSMENT / PLAN:  PULMONARY A: 1) Mild hypoxemia. Likely some pulmonary edema, sepsis P:   - Continue supplemental oxygen via [place]  CARDIOVASCULAR A:  1) Septic shock secondary to renal abscess. Post percutaneous nephrostomy. P:  - Sepsis  protocol initiated - IVF and pressors per sepsis protocol  RENAL A:   1) Left renal abscess 2) Acute on chronic renal failure 3) Post percutaneous nephrostomy P:   - IVF resuscitation per sepsis protocol - Continue cefepime  GASTROINTESTINAL A:   1) No issues P:     HEMATOLOGIC A:   1) Mild anemia, likely CKD, sepsis P:  Will follow CBC  INFECTIOUS A:   1) Left renal abscess secondary to gram negatives P:   - Will follow cultures - Continue cefepime  ENDOCRINE A:   1) No issues   NEUROLOGIC A:   1) Slightly confused and lethargic, non focal. Likely sepsis related     I have personally obtained a history, examined the patient, evaluated laboratory and imaging results, formulated the assessment and plan and placed orders. CRITICAL CARE: The patient is critically ill with multiple organ systems failure and requires high  complexity decision making for assessment and support, frequent evaluation and titration of therapies, application of advanced monitoring technologies and extensive interpretation of multiple databases. Critical Care Time devoted to patient care services described in this note is 60 minutes.   Overton Mam, MD Pulmonary and Critical Care Medicine [facility] Pager: [phone]  08/18/2013, 9:23 PM

## 2013-08-18 NOTE — Progress Notes (Signed)
ANTIBIOTIC CONSULT NOTE - INITIAL  Pharmacy Consult:  Cefepime Indication:  Gram negative sepsis  No Known Allergies  Patient Measurements: Height: 5\' 11"  (180.3 cm) Weight: 242 lb 11.6 oz (110.1 kg) IBW/kg (Calculated) : 75.3  Vital Signs: Temp: 100.8 F (38.2 C) (11/23 1612) Temp src: Rectal (11/23 1612) BP: 99/46 mmHg (11/23 1612) Pulse Rate: 107 (11/23 1612)  Labs: No results found for this basename: WBC, HGB, PLT, LABCREA, CREATININE,  in the last 72 hours CrCl is unknown because no creatinine reading has been taken. No results found for this basename: VANCOTROUGH, VANCOPEAK, VANCORANDOM, GENTTROUGH, GENTPEAK, GENTRANDOM, TOBRATROUGH, TOBRAPEAK, TOBRARND, AMIKACINPEAK, AMIKACINTROU, AMIKACIN,  in the last 72 hours   Microbiology: No results found for this or any previous visit (from the past 720 hour(s)).  Medical History: Past Medical History  Diagnosis Date  . Arthritis   . Degenerative disc disease   . Degenerative joint disease   . Aortic aneurysm   . Pneumonia   . Hypercholesterolemia   . Hypertension   . CKD (chronic kidney disease), stage III       Assessment: 87 YOM with hematuria and UTI treated outpatient with Cipro and Augmentin.  Patient presented to [place] with lethargy and hypotension, then transferred to [facility] on 08/18/13 for emergent nephrostomy tube placement for severe left-sided hydronephrosis.  He was started on Cefepime at [facility] and to continue here at [facility].  Based on [facility] from [place], patient last received Cefepime on 08/17/13 around 2000.  Patient has AKI with estimated CrCL ~11 ml/min.   Goal of Therapy:  Clearance of infection   Plan:  - Cefepime 1gm IV Q24H, start tonight - Monitor renal fxn, clinical course, micro data    Camron Monday D. Laney Potash, PharmD, BCPS Pager:  [phone] 08/18/2013, 5:27 PM

## 2013-08-18 NOTE — H&P (Signed)
History and Physical       Hospital Admission Note Date: 08/18/2013  Patient name: Nathan Rivera Medical record number: [id number] Date of birth: 09-30-1945 Age: 67 y.o. Gender: male PCP: [facility], DALE E, MD    Chief Complaint:  Transferred from [facility] for severe left-sided hydronephrosis and need for emergent nephrostomy tube  HPI: Patient is a 67 year old male with history of hypertension, anemia, history of bladder cancer , hyperlipidemia, tobacco abuse, nephrolithiasis was originally admitted on 08/17/13 at [facility] hospital. Patient was apparently having issues with hematuria and UTI as an outpatient and was being treated with ciprofloxacin and Augmentin. Patient was lethargic on the day of admission and hypotensive with BP of 74/44. He was admitted to the ICU with septic shock and was found to have gram-negative sepsis ( final blood cultures are still pending). Ultrasound of the abdomen did not show any evidence for obstruction in the gallbladder area but was noted to have 1 cm stone with hydronephrosis. CT of the abdomen done today showed minimal cholelithiasis, severe left hydronephrosis secondary to 15 x 7 MM calculus in the middle portion of the left ureter. Urology was consulted and patient was seen by Dr. Yevonne Aline at [facility] who felt given patient's severe sepsis picture, significant leukocytosis, elevated creatinine and large proximal ureteral stone, he needs a nephrostomy tube. Risk of stent would be inability to pass the stent or it might not fully drain his urinary tract and hence a nephrostomy tube was felt more appropriate. Patient was transferred to [facility] for emergentinterventional radiology consultation for nephrostomy tube.     Review of Systems:  Constitutional: + fever, chills, diaphoresis, poor appetite and fatigue.  HEENT: Denies photophobia, eye pain, redness, hearing loss, ear pain,  congestion, sore throat, rhinorrhea, sneezing, mouth sores, trouble swallowing, neck pain, neck stiffness and tinnitus.   Respiratory: Denies SOB, DOE, cough, chest tightness,  and wheezing.   Cardiovascular: Denies chest pain, palpitations and leg swelling.  Gastrointestinal: Denies nausea, vomiting, diarrhea, constipation, blood in stool and abdominal distention. +patient has left flank pain andtenderness Genitourinary: please see history of present illness, +hematuria Musculoskeletal: Denies myalgias, back pain, joint swelling, arthralgias and gait problem.  Skin: Denies pallor, rash and wound.  Neurological: Denies dizziness, seizures, syncope, weakness, light-headedness, numbness and headaches.  Hematological: Denies adenopathy. Easy bruising, personal or family bleeding history  Psychiatric/Behavioral: Denies suicidal ideation, mood changes, confusion, nervousness, sleep disturbance and agitation  Past Medical History: Past Medical History  Diagnosis Date  . Arthritis   . Degenerative disc disease   . Degenerative joint disease   . Aortic aneurysm   . Pneumonia   . Hypercholesterolemia   . Hypertension   . CKD (chronic kidney disease), stage III    Past Surgical History  Procedure Laterality Date  . Abdominal aortic aneurysm repair  2011  . Hernia repair    . Tonsillectomy    . Umbilical hernia repair      50 yr ago    Medications: Prior to Admission medications   Medication Sig Start Date End Date Taking? Authorizing Provider  amLODipine-olmesartan (AZOR) 5-40 MG per tablet Take 1 tablet by mouth daily.      Historical Provider, MD  simvastatin (ZOCOR) 20 MG tablet Take 20 mg by mouth at bedtime.      Historical Provider, MD    Allergies:  No Known Allergies  Social History:  reports that he has been smoking Cigarettes.  He has a 40 pack-year smoking history. He does not  have any smokeless tobacco history on file. He reports that he drinks about 1.8 ounces of alcohol  per week. He reports that he does not use illicit drugs.  Family History: Family History  Problem Relation Age of Onset  . CVA Father   . Atrial fibrillation Sister     Physical Exam: Blood pressure 99/46, pulse 107, temperature 100.8 F (38.2 C), temperature source Rectal, resp. rate 20, height 5\' 11"  (1.803 m), weight 110.1 kg (242 lb 11.6 oz), SpO2 94.00%. General: Alert, awake, oriented x3, in no acute distress, mildly uncomfortable due to pain. HEENT: normocephalic, atraumatic, anicteric sclera, pink conjunctiva, pupils equal and reactive to light and accomodation, oropharynx clear, dry mucosal membranes Neck: supple, no masses or lymphadenopathy, no goiter, no bruits  Heart: Regular rate and rhythm, without murmurs, rubs or gallops. Lungs: Clear to auscultation bilaterally, no wheezing, rales or rhonchi. Abdomen: Soft, left flank tenderness, left CVAT, non distended, normal bowel sounds Extremities: No clubbing, cyanosis or edema with positive pedal pulses. Neuro: Grossly intact, no focal neurological deficits, strength 5/5 upper and lower extremities bilaterally Psych: alert and oriented x 3, normal mood and affect Skin: no rashes or lesions, warm and dry   LABS on Admission:  Labs done at [facility] today showed sodium 135 potassium 5.2 chloride 112 bicarbonate 14, calcium 8.3 creatinine 6.81 BUN 127  CBC on November 22 WBC 27.8 hemoglobin 13 hematocrit 38.9 platelets 182 November 23 hemoglobin 10.3 hematocrit 30.9 platelets 161, WBCs 43.6  Blood cultures shows gram-negative rods  UA on admission was positive for UTI, urine cultures had shown gram-negative rods  Recent Labs Lab 08/18/13 1606 08/18/13 1637  GLUCAP 63* 96     Radiological Exams on Admission: Chest x-ray on November 22 had shown no active cardiopulmonary disease  CT abdomen and pelvis on 08/18/2013 showed minimal cholelithiasis, small periumbilical hernia containing loop of small bowel but not in  incarceration or obstruction severe left hydronephrosis secondary to 15 x 7 MM calculus in the middle portion of the left ureter  Assessment/Plan Principal Problem:   Severe sepsis due to gram-negative rods/ GNR UTI , hypotension - will obtain urine culture, blood cultures, continue IV fluid hydration, n.p.o. Status, IV cefepime - Will obtain final blood culture results and urine culture results from [facility] - PCCM is aware of the patient, discussed with Dr. Delton Coombes  Active Problems: Severe left hydronephrosis secondary to proximal ureteral obstruction/nephrolithiasis - I have contacted interventional radiology, discussed with Dr. Fredia Sorrow for nephrostomy tube - continue n.p.o. Status, will obtain stat labs for coag panel, CBC, BMET     Acute renal failure - patient appears to have chronic kidney disease, acute renal failure likely precipitated due to severe sepsis and ureteral obstruction from nephrolithiasis, hopefully will improve with the nephrostomy tube - Obtain BMET, continue IV fluid hydration, IV antibiotics    Hypotension due to severe sepsis - Secondary to #1, may need vasopressors due to severe sepsis and if no significant improvement with IV fluids. Currently has 4 peripheral lines, PCCM aware.  - hold PTA antihypertensives  DVT prophylaxis: bilateral SCDs, patient was having hematuria at [facility], currently pending interventional radiology procedure, no pharmacological DVT prophylaxis at this time   CODE STATUS:  Full code status   Family Communication: Admission, patients condition and plan of care including tests being ordered have been discussed with the patient and his wife who indicates understanding and agree with the plan and Code Status   Further plan will depend as patient's clinical  course evolves and further radiologic and laboratory data become available.   Time Spent on Admission: 1 hour  Antavion Bartoszek M.D. [facility] 08/18/2013, 4:57  PM Pager: [phone]  If 7PM-7AM, please contact night-coverage [email or web address] Password [email or web address]

## 2013-08-18 NOTE — Consult Note (Signed)
Central Venous Catheter Insertion Procedure Note Nathan Rivera [id number] Jul 26, 1946  Procedure: Insertion of Central Venous Catheter Indications: Assessment of intravascular volume, Drug and/or fluid administration and Frequent blood sampling  Procedure Details Consent: Risks of procedure as well as the alternatives and risks of each were explained to the (patient/caregiver).  Consent for procedure obtained. Time Out: Verified patient identification, verified procedure, site/side was marked, verified correct patient position, special equipment/implants available, medications/allergies/relevent history reviewed, required imaging and test results available.  Performed  Maximum sterile technique was used including antiseptics, cap, gloves, gown, hand hygiene, mask and sheet. Skin prep: Chlorhexidine; local anesthetic administered A antimicrobial bonded/coated triple lumen catheter was placed in the left internal jugular vein using the Seldinger technique.  Evaluation Blood flow good Complications: No apparent complications Patient did tolerate procedure well. Chest X-ray ordered to verify placement.  CXR: normal.  Overton Mam, M.D. Pulmonary and Critical Care Medicine Call E-link with questions [phone] 08/18/2013, 11:52 PM

## 2013-08-19 DIAGNOSIS — I959 Hypotension, unspecified: Secondary | ICD-10-CM

## 2013-08-19 LAB — COMPREHENSIVE METABOLIC PANEL
ALT: 47 U/L (ref 0–53)
AST: 53 U/L — ABNORMAL HIGH (ref 0–37)
Alkaline Phosphatase: 312 U/L — ABNORMAL HIGH (ref 39–117)
CO2: 12 mEq/L — ABNORMAL LOW (ref 19–32)
GFR calc Af Amer: 13 mL/min — ABNORMAL LOW (ref >90)
Glucose, Bld: 105 mg/dL — ABNORMAL HIGH (ref 70–99)
Potassium: 5.2 mEq/L — ABNORMAL HIGH (ref 3.5–5.1)
Sodium: 140 mEq/L (ref 135–145)
Total Protein: 5.7 g/dL — ABNORMAL LOW (ref 6.0–8.3)

## 2013-08-19 LAB — GLUCOSE, CAPILLARY
Glucose-Capillary: 105 mg/dL — ABNORMAL HIGH (ref 70–99)
Glucose-Capillary: 116 mg/dL — ABNORMAL HIGH (ref 70–99)
Glucose-Capillary: 99 mg/dL (ref 70–99)

## 2013-08-19 LAB — BASIC METABOLIC PANEL
BUN: 109 mg/dL — ABNORMAL HIGH (ref 6–23)
Calcium: 7.6 mg/dL — ABNORMAL LOW (ref 8.4–10.5)
Chloride: 112 mEq/L (ref 96–112)
Creatinine, Ser: 4.91 mg/dL — ABNORMAL HIGH (ref 0.50–1.35)
GFR calc Af Amer: 13 mL/min — ABNORMAL LOW (ref >90)
GFR calc non Af Amer: 11 mL/min — ABNORMAL LOW (ref >90)
Sodium: 138 mEq/L (ref 135–145)

## 2013-08-19 LAB — CORTISOL: Cortisol, Plasma: 14.8 ug/dL

## 2013-08-19 LAB — CBC
Hemoglobin: 9.7 g/dL — ABNORMAL LOW (ref 13.0–17.0)
Hemoglobin: 10.1 g/dL — ABNORMAL LOW (ref 13.0–17.0)
MCH: 29.3 pg (ref 26.0–34.0)
MCHC: 35.5 g/dL (ref 30.0–36.0)
MCHC: 34.2 g/dL (ref 30.0–36.0)
MCV: 85 fL (ref 78.0–100.0)
Platelets: 153 10*3/uL (ref 150–400)
RBC: 3.45 MIL/uL — ABNORMAL LOW (ref 4.22–5.81)
RDW: 17.6 % — ABNORMAL HIGH (ref 11.5–15.5)
WBC: 36.8 10*3/uL — ABNORMAL HIGH (ref 4.0–10.5)

## 2013-08-19 LAB — ABO/RH: ABO/RH(D): A POS

## 2013-08-19 MED ORDER — ONDANSETRON HCL 4 MG/2ML IJ SOLN
INTRAMUSCULAR | Status: AC
  Filled 2013-08-19: qty 2

## 2013-08-19 MED ORDER — ONDANSETRON HCL 4 MG/2ML IJ SOLN: 4.0000 mg | Freq: Four times a day (QID) | INTRAMUSCULAR | Status: DC | PRN

## 2013-08-19 MED ORDER — POTASSIUM CHLORIDE CRYS ER 20 MEQ PO TBCR: 20.0000 meq | EXTENDED_RELEASE_TABLET | ORAL | Status: DC

## 2013-08-19 NOTE — Progress Notes (Deleted)
[facility] ADULT ICU REPLACEMENT PROTOCOL FOR AM LAB REPLACEMENT ONLY  The patient does apply for the [facility] Adult ICU Electrolyte Replacment Protocol based on the criteria listed below:   1. Is GFR >/= 40 ml/min? yes  Patient's GFR today is 81 2. Is urine output >/= 0.5 ml/kg/hr for the last 6 hours? yes Patient's UOP is 0.9 ml/kg/hr 3. Is BUN < 60 mg/dL? yes  Patient's BUN today is 30 4. Abnormal electrolyte(s): K 3.5 5. Ordered repletion with: per protocol 6. If a panic level lab has been reported, has the CCM MD in charge been notified? no.   Physician:    Markus Daft A 08/19/2013 6:37 AM

## 2013-08-19 NOTE — Progress Notes (Signed)
Dr. Darrick Penna made aware of CBC and BMET drawn at 2150 on 08-18-13.  Corliss Skains RN

## 2013-08-19 NOTE — Progress Notes (Signed)
PULMONARY  / CRITICAL CARE MEDICINE  Name: Nathan Rivera MRN: [id number] DOB: Oct 14, 1945    ADMISSION DATE:  08/18/2013 CONSULTATION DATE:  08/18/13  REFERRING MD :  Silvio Pate, MD PRIMARY SERVICE: PCCM  CHIEF COMPLAINT:   Left renal abscess post percutaneous nephrostomy and septic shock.  BRIEF PATIENT DESCRIPTION:  67 years old male with PMH relevant for HTN and CKD. Transferred from [place] with septic shock secondary to left renal abscess. Now status post left percutaneous nephrostomy.  SIGNIFICANT EVENTS / STUDIES:  - 11/22 admitted to [place] with septic shock/UTI secondary to gram negatives.  - 11/22 found to have L renal abscess - 11/23 transferred to [facility] for perc nephrostomy - Post left percutaneous nephrostomy.  LINES / TUBES: - 11/24 Left IJ CVC - xxx Post left percutaneous nephrostomy. - 11/23? Peripheral IV  CULTURES: 11/23 l kidney abscess cx>>>mod gm neg rods x 2 from ala 11/23 blood culture>>> 11/23 aerobic culture>>> 11/23 urine culture>>>  ANTIBIOTICS: 11/23 Cefepime  SUBJECTIVE: Feels okay, tired.  VITAL SIGNS: Temp:  [97.5 F (36.4 C)-100.8 F (38.2 C)] 97.6 F (36.4 C) (11/24 0412) Pulse Rate:  [86-109] 91 (11/24 0800) Resp:  [13-28] 17 (11/24 0800) BP: (75-141)/(41-86) 110/86 mmHg (11/24 0800) SpO2:  [90 %-99 %] 96 % (11/24 0800) Weight:  [242 lb 11.6 oz (110.1 kg)] 242 lb 11.6 oz (110.1 kg) (11/23 1612) HEMODYNAMICS: CVP:  [7 mmHg-13 mmHg] 11 mmHg VENTILATOR SETTINGS:   INTAKE / OUTPUT: Intake/Output     11/23 0701 - 11/24 0700 11/24 0701 - 11/25 0700   I.V. (mL/kg) 2263.4 (20.6) 143.4 (1.3)   IV Piggyback 1550    Total Intake(mL/kg) 3813.4 (34.6) 143.4 (1.3)   Urine (mL/kg/hr) 1890    Total Output 1890     Net +1923.4 +143.4          PHYSICAL EXAMINATION:  General: Ill appearance. Lying in bed in NAD. Eyes: Anicteric sclerae. Heart: Normal S1, S2. No murmurs, rubs, or gallops appreciated. No bruits, equal pulses bilateral  DP. Lungs: Good air movement bilaterally, without wheezes or crackles.  Abdomen: Abdomen soft, non-tender and not distended, normoactive bowel sounds. No hepatosplenomegaly or masses. Left percutaneous nephrostomy. Musculoskeletal: No clubbing or synovitis. Skin: No rashes or lesions Neuro: No focal neurologic deficits. Follows commands. RASS -1 to 0. Awake. Normal speech.  LABS:  CBC  Recent Labs Lab 08/18/13 1657 08/18/13 2150 08/19/13 0500  WBC 11.1* 36.8* 30.0*  HGB 10.9* 9.7* 10.1*  HCT 31.7* 27.3* 29.5*  PLT 151 150 153   Coag's  Recent Labs Lab 08/18/13 1657 08/18/13 2150  APTT  --  27  INR 1.16  --    BMET  Recent Labs Lab 08/18/13 1657 08/18/13 2150 08/19/13 0500  NA 135 138 140  K 5.1 4.9 5.2*  CL 111 112 114*  CO2 10* 11* 12*  BUN 117* 109* 107*  CREATININE 5.23* 4.91* 4.91*  GLUCOSE 81 77 105*   Electrolytes  Recent Labs Lab 08/18/13 1657 08/18/13 2150 08/19/13 0500  CALCIUM 8.1* 7.6* 7.8*   Sepsis Markers  Recent Labs Lab 08/18/13 2100  LATICACIDVEN 0.8   ABG No results found for this basename: PHART, PCO2ART, PO2ART,  in the last 168 hours Liver Enzymes  Recent Labs Lab 08/19/13 0500  AST 53*  ALT 47  ALKPHOS 312*  BILITOT 1.1  ALBUMIN 1.5*   Cardiac Enzymes  Recent Labs Lab 08/18/13 2150  TROPONINI <0.30   Glucose  Recent Labs Lab 08/18/13 1637 08/18/13 1950 08/18/13 2011  08/18/13 2203 08/19/13 0010 08/19/13 0400  GLUCAP 96 42* 76 93 116* 105*    Imaging Dg Chest Port 1 View  08/18/2013   CLINICAL DATA:  Central line placement  EXAM: PORTABLE CHEST - 1 VIEW  COMPARISON:  08/17/2013  FINDINGS: Left internal jugular central line has been placed with tip over the anticipated position of the junction of the brachiocephalic vein and superior vena cava. No pneumothorax. Heart size is normal. Lungs are clear.  IMPRESSION: Central line as described above with no pneumothorax   Electronically Signed   By: Esperanza Heir M.D.   On: 08/18/2013 20:58     CXR: 11/23: Central line, no infiltrates  ASSESSMENT / PLAN:  PULMONARY A: 1) Mild hypoxemia. Likely some pulmonary edema, sepsis 2) Acidosis, ?respiratory vs lactic acidosis from sepsis P:   - Continue supplemental oxygen via [place]. - IS per RT protocol.  CARDIOVASCULAR A:  1) Septic shock secondary to renal abscess. Post percutaneous nephrostomy. P:  - Continue sepsis protocol. - IVF and pressors per sepsis protocol.  RENAL A:   1) Left renal abscess 2) Acute on chronic renal failure 3) Post percutaneous nephrostomy 4) Mild hyperkalemia P:   - IVF resuscitation per sepsis protocol. - Continue cefepime. - Recheck BMET in AM.  GASTROINTESTINAL A:   1) No issues P:   - NPO for now.  HEMATOLOGIC A:   1) Mild anemia, likely CKD, sepsis 2) Leukocytosis P:  - Will follow CBC.  INFECTIOUS A:   1) Left renal abscess secondary to gram negatives P:   - Will follow cultures. - Continue cefepime.  ENDOCRINE A:   1) No issues. P: - Stress-dose steroids if BP drops.  NEUROLOGIC A:   1) Does not appear lethargic or confused this AM and is non focal. Likely sepsis related previous lethargy/confusion. P:  - Monitor for airway protection.  Leona Singleton, MD Family Practice PGY2  08/19/2013, 8:31 AM  Patient has done well off pressors, will transfer to SDU and to [facility], PCCM will sign off, please call back if needed.  Patient seen and examined, agree with above note.  I dictated the care and orders written for this patient under my direction.  Alyson Reedy, MD [phone]

## 2013-08-20 DIAGNOSIS — E860 Dehydration: Secondary | ICD-10-CM | POA: Diagnosis present

## 2013-08-20 DIAGNOSIS — A4151 Sepsis due to Escherichia coli [E. coli]: Principal | ICD-10-CM

## 2013-08-20 DIAGNOSIS — N151 Renal and perinephric abscess: Secondary | ICD-10-CM

## 2013-08-20 DIAGNOSIS — A419 Sepsis, unspecified organism: Secondary | ICD-10-CM

## 2013-08-20 DIAGNOSIS — Z113 Encounter for screening for infections with a predominantly sexual mode of transmission: Secondary | ICD-10-CM

## 2013-08-20 DIAGNOSIS — R7881 Bacteremia: Secondary | ICD-10-CM | POA: Diagnosis present

## 2013-08-20 DIAGNOSIS — J9601 Acute respiratory failure with hypoxia: Secondary | ICD-10-CM | POA: Diagnosis present

## 2013-08-20 LAB — MAGNESIUM: Magnesium: 2.2 mg/dL (ref 1.5–2.5)

## 2013-08-20 LAB — CBC
HCT: 29.3 % — ABNORMAL LOW (ref 39.0–52.0)
MCHC: 34.8 g/dL (ref 30.0–36.0)
MCV: 85.2 fL (ref 78.0–100.0)
RDW: 17.5 % — ABNORMAL HIGH (ref 11.5–15.5)
WBC: 18.6 10*3/uL — ABNORMAL HIGH (ref 4.0–10.5)

## 2013-08-20 LAB — PHOSPHORUS: Phosphorus: 5.6 mg/dL — ABNORMAL HIGH (ref 2.3–4.6)

## 2013-08-20 LAB — BASIC METABOLIC PANEL
Calcium: 8.4 mg/dL (ref 8.4–10.5)
Chloride: 119 mEq/L — ABNORMAL HIGH (ref 96–112)
Creatinine, Ser: 4.08 mg/dL — ABNORMAL HIGH (ref 0.50–1.35)
GFR calc Af Amer: 16 mL/min — ABNORMAL LOW (ref >90)
GFR calc non Af Amer: 14 mL/min — ABNORMAL LOW (ref >90)
Potassium: 5.2 mEq/L — ABNORMAL HIGH (ref 3.5–5.1)

## 2013-08-20 MED ORDER — SODIUM CHLORIDE 0.9 % IJ SOLN
10.0000 mL | Freq: Two times a day (BID) | INTRAMUSCULAR | Status: DC
  Administered 2013-08-20: 10 mL via INTRAVENOUS
  Administered 2013-08-21: 3 mL via INTRAVENOUS
  Administered 2013-08-22 – 2013-08-23 (×3): 10 mL via INTRAVENOUS

## 2013-08-20 MED ORDER — SODIUM CHLORIDE 0.9 % IV SOLN
250.0000 mg | Freq: Four times a day (QID) | INTRAVENOUS | Status: DC
  Administered 2013-08-20 – 2013-08-24 (×14): 250 mg via INTRAVENOUS
  Filled 2013-08-20 (×18): qty 250

## 2013-08-20 NOTE — Consult Note (Signed)
[facility]    Date of Admission:  08/18/2013  Date of Consult:  08/20/2013  Reason for Consult: ESBL bacteremia renal abscess, septic shock Referring Physician: Dr. Butler Denmark   HPI: Nathan Rivera is an 67 y.o. male. , history of bladder cancer, tobacco abuse, nephrolithiasis who  was originally admitted on 08/17/13 at [facility]. Patient was apparently having issues with hematuria and UTI as an outpatient and was being treated with ciprofloxacin and Augmentin by his Urologist.  Patient was lethargic on the day of admission and hypotensive with BP of 74/44. He was admitted to the ICU with septic shock and was found to have gram-negative sepsis --finally today ID'd as ESBL Ultrasound of the abdomen did not show any evidence for obstruction in the gallbladder area but was noted to have 1 cm stone with hydronephrosis. CT of the abdomen done today showed minimal cholelithiasis, severe left hydronephrosis secondary to 15 x 7 MM calculus in the middle portion of the left ureter. Urology was consulted and patient was seen by Dr. Yevonne Aline at [facility] who felt given patient's severe sepsis picture, significant leukocytosis, elevated creatinine and large proximal ureteral stone, he needed a nephrostomy tube. Patient was transferred to [facility] for emergentinterventional radiology consultation for nephrostomy tube. When tube placed it yielded frank pus --looked like "milky mustard" per wife.   Pt has stablized and has been trasnferred out of the ICU and stepdown. He has been on cefepime which should not be active vs the ESBL E colibut is dramatically better at present.   Today he was changed to imipenem and we were consulted.     Past Medical History  Diagnosis Date  . Arthritis   . Degenerative disc disease   . Degenerative joint disease   . Aortic aneurysm   . Pneumonia   . Hypercholesterolemia   . Hypertension   . CKD (chronic kidney disease), stage III      Past Surgical History  Procedure Laterality Date  . Abdominal aortic aneurysm repair  2011  . Hernia repair    . Tonsillectomy    . Umbilical hernia repair      50 yr ago  ergies:   No Known Allergies   Medications: I have reviewed patients current medications as documented in Epic Anti-infectives   Start     Dose/Rate Route Frequency Ordered Stop   08/20/13 1200  imipenem-cilastatin (PRIMAXIN) 250 mg in sodium chloride 0.9 % 100 mL IVPB     250 mg 200 mL/hr over 30 Minutes Intravenous 4 times per day 08/20/13 1132     08/18/13 2200  ceFEPIme (MAXIPIME) 1 g in dextrose 5 % 50 mL IVPB  Status:  Discontinued     1 g 100 mL/hr over 30 Minutes Intravenous Every 12 hours 08/18/13 1846 08/18/13 1847   08/18/13 2000  ceFEPIme (MAXIPIME) 1 g in dextrose 5 % 50 mL IVPB  Status:  Discontinued     1 g 100 mL/hr over 30 Minutes Intravenous Every 24 hours 08/18/13 1728 08/18/13 1846   08/18/13 1900  ceFEPIme (MAXIPIME) 1 g in dextrose 5 % 50 mL IVPB  Status:  Discontinued     1 g 100 mL/hr over 30 Minutes Intravenous Every 24 hours 08/18/13 1847 08/20/13 1132      Social History:  reports that he has been smoking Cigarettes.  He has a 40 pack-year smoking history. He does not have any smokeless tobacco history on file. He reports that he  drinks about 1.8 ounces of alcohol per week. He reports that he does not use illicit drugs.  Family History  Problem Relation Age of Onset  . CVA Father   . Atrial fibrillation Sister     As in HPI and primary teams notes otherwise 12 point review of systems is negative  Blood pressure 109/67, pulse 101, temperature 97.5 F (36.4 C), temperature source Oral, resp. rate 17, height 5\' 11"  (1.803 m), weight 242 lb 11.6 oz (110.1 kg), SpO2 95.00%. General: Alert and awake, oriented x3, not in any acute distress. HEENT: anicteric sclera, pupils reactive to light and accommodation, EOMI, oropharynx clear and without exudate CVS regular rate, normal r,   no murmur rubs or gallops Chest: clear to auscultation bilaterally, no wheezing, rales or rhonchi Abdomen: soft  Diffusely tendernormal bowel sounds, neprhostomy tube with serosanguinous fluid Extremities: no  clubbing or edema noted bilaterally Skin: no rashes Neuro: nonfocal, strength and sensation intact   Results for orders placed during the hospital encounter of 08/18/13 (from the past 48 hour(s))  GLUCOSE, CAPILLARY     Status: None   Collection Time    08/18/13  4:37 PM      Result Value Range   Glucose-Capillary 96  70 - 99 mg/dL  CBC     Status: Abnormal   Collection Time    08/18/13  4:57 PM      Result Value Range   WBC 11.1 (*) 4.0 - 10.5 K/uL   RBC 3.72 (*) 4.22 - 5.81 MIL/uL   Hemoglobin 10.9 (*) 13.0 - 17.0 g/dL   HCT 16.1 (*) 09.6 - 04.5 %   MCV 85.2  78.0 - 100.0 fL   MCH 29.3  26.0 - 34.0 pg   MCHC 34.4  30.0 - 36.0 g/dL   RDW 40.9 (*) 81.1 - 91.4 %   Platelets 151  150 - 400 K/uL  BASIC METABOLIC PANEL     Status: Abnormal   Collection Time    08/18/13  4:57 PM      Result Value Range   Sodium 135  135 - 145 mEq/L   Potassium 5.1  3.5 - 5.1 mEq/L   Chloride 111  96 - 112 mEq/L   CO2 10 (*) 19 - 32 mEq/L   Comment: CRITICAL RESULT CALLED TO, READ BACK BY AND VERIFIED WITH:     M.WRIGHT,RN 1826 08/18/13 M.CAMPBELL   Glucose, Bld 81  70 - 99 mg/dL   BUN 782 (*) 6 - 23 mg/dL   Creatinine, Ser 9.56 (*) 0.50 - 1.35 mg/dL   Calcium 8.1 (*) 8.4 - 10.5 mg/dL   GFR calc non Af Amer 10 (*) >90 mL/min   GFR calc Af Amer 12 (*) >90 mL/min   Comment: (NOTE)     The eGFR has been calculated using the CKD EPI equation.     This calculation has not been validated in all clinical situations.     eGFR's persistently <90 mL/min signify possible Chronic Kidney     Disease.  PROTIME-INR     Status: None   Collection Time    08/18/13  4:57 PM      Result Value Range   Prothrombin Time 14.6  11.6 - 15.2 seconds   INR 1.16  0.00 - 1.49  URINE CULTURE     Status: None     Collection Time    08/18/13  5:13 PM      Result Value Range   Specimen Description URINE, CLEAN  CATCH     Special Requests NONE     Culture  Setup Time       Value: 08/19/2013 00:11     Performed at [facility] Count       Value: 70,000 COLONIES/ML     Performed at [facility]   Culture       Value: ESCHERICHIA COLI     Performed at [facility]   Report Status PENDING    URINALYSIS, ROUTINE W REFLEX MICROSCOPIC     Status: Abnormal   Collection Time    08/18/13  5:13 PM      Result Value Range   Color, Urine ORANGE (*) YELLOW   Comment: BIOCHEMICALS MAY BE AFFECTED BY COLOR   APPearance CLOUDY (*) CLEAR   Specific Gravity, Urine 1.012  1.005 - 1.030   pH 5.5  5.0 - 8.0   Glucose, UA NEGATIVE  NEGATIVE mg/dL   Hgb urine dipstick LARGE (*) NEGATIVE   Bilirubin Urine LARGE (*) NEGATIVE   Ketones, ur NEGATIVE  NEGATIVE mg/dL   Protein, ur 30 (*) NEGATIVE mg/dL   Urobilinogen, UA 1.0  0.0 - 1.0 mg/dL   Nitrite POSITIVE (*) NEGATIVE   Leukocytes, UA LARGE (*) NEGATIVE  URINE MICROSCOPIC-ADD ON     Status: Abnormal   Collection Time    08/18/13  5:13 PM      Result Value Range   Squamous Epithelial / LPF RARE  RARE   WBC, UA TOO NUMEROUS TO COUNT  <3 WBC/hpf   RBC / HPF TOO NUMEROUS TO COUNT  <3 RBC/hpf   Bacteria, UA FEW (*) RARE  CULTURE, ROUTINE-ABSCESS     Status: None   Collection Time    08/18/13  6:49 PM      Result Value Range   Specimen Description ABSCESS LEFT KIDNEY     Special Requests NONE     Gram Stain       Value: ABUNDANT WBC PRESENT,BOTH PMN AND MONONUCLEAR     NO SQUAMOUS EPITHELIAL CELLS SEEN     MODERATE GRAM NEGATIVE RODS     Performed at [facility]   Culture       Value: ABUNDANT GRAM NEGATIVE RODS     Performed at [facility]   Report Status PENDING    ANAEROBIC CULTURE     Status: None   Collection Time    08/18/13  6:54 PM      Result Value Range   Specimen Description ABSCESS LEFT  KIDNEY     Special Requests NONE     Gram Stain       Value: ABUNDANT WBC PRESENT,BOTH PMN AND MONONUCLEAR     NO SQUAMOUS EPITHELIAL CELLS SEEN     MODERATE GRAM NEGATIVE RODS     Performed at [facility]   Culture       Value: NO ANAEROBES ISOLATED; CULTURE IN PROGRESS FOR 5 DAYS     Performed at [facility]   Report Status PENDING    CULTURE, BLOOD (ROUTINE X 2)     Status: None   Collection Time    08/18/13  7:05 PM      Result Value Range   Specimen Description BLOOD RIGHT HAND     Special Requests BOTTLES DRAWN AEROBIC AND ANAEROBIC 10CC     Culture  Setup Time       Value: 08/19/2013 00:10     Performed at [facility]   Culture       Value:        BLOOD CULTURE RECEIVED NO GROWTH TO DATE CULTURE WILL BE HELD FOR 5 DAYS BEFORE ISSUING A FINAL NEGATIVE REPORT     Performed at [facility]   Report Status PENDING    GLUCOSE, CAPILLARY     Status: Abnormal   Collection Time    08/18/13  7:50 PM      Result Value Range   Glucose-Capillary 42 (*) 70 - 99 mg/dL   Comment 1 Notify RN    GLUCOSE, CAPILLARY     Status: None   Collection Time    08/18/13  8:11 PM      Result Value Range   Glucose-Capillary 76  70 - 99 mg/dL  LACTIC ACID, PLASMA     Status: None   Collection Time    08/18/13  9:00 PM      Result Value Range   Lactic Acid, Venous 0.8  0.5 - 2.2 mmol/L  CARBOXYHEMOGLOBIN     Status: Abnormal   Collection Time    08/18/13  9:23 PM      Result Value Range   Total hemoglobin 10.9 (*) 13.5 - 18.0 g/dL   O2 Saturation 81.1     Carboxyhemoglobin 1.5  0.5 - 1.5 %   Methemoglobin 1.5  0.0 - 1.5 %  TYPE AND SCREEN     Status: None   Collection Time    08/18/13  9:35 PM      Result Value Range   ABO/RH(D) A POS     Antibody Screen NEG     Sample Expiration 08/21/2013    ABO/RH     Status: None   Collection Time    08/18/13  9:35 PM      Result Value Range   ABO/RH(D) A POS    CULTURE, BLOOD (ROUTINE X 2)     Status: None     Collection Time    08/18/13  9:45 PM      Result Value Range   Specimen Description BLOOD LEFT HAND     Special Requests BOTTLES DRAWN AEROBIC AND ANAEROBIC 10CC EA     Culture  Setup Time       Value: 08/19/2013 02:54     Performed at [facility]   Culture       Value:        BLOOD CULTURE RECEIVED NO GROWTH TO DATE CULTURE WILL BE HELD FOR 5 DAYS BEFORE ISSUING A FINAL NEGATIVE REPORT     Performed at [facility]   Report Status PENDING    CORTISOL     Status: None   Collection Time    08/18/13  9:50 PM      Result Value Range   Cortisol, Plasma 14.8     Comment: (NOTE)     AM:  4.3 - 22.4 ug/dL     PM:  3.1 - 91.4 ug/dL     Performed at [facility]  TROPONIN I     Status: None   Collection Time    08/18/13  9:50 PM      Result Value Range   Troponin I <0.30  <0.30 ng/mL   Comment:            Due to the release kinetics of cTnI,     a negative result within the first hours     of the onset of symptoms does not  rule out     myocardial infarction with certainty.     If myocardial infarction is still suspected,     repeat the test at appropriate intervals.  APTT     Status: None   Collection Time    08/18/13  9:50 PM      Result Value Range   aPTT 27  24 - 37 seconds  FIBRINOGEN     Status: Abnormal   Collection Time    08/18/13  9:50 PM      Result Value Range   Fibrinogen 707 (*) 204 - 475 mg/dL  BASIC METABOLIC PANEL     Status: Abnormal   Collection Time    08/18/13  9:50 PM      Result Value Range   Sodium 138  135 - 145 mEq/L   Potassium 4.9  3.5 - 5.1 mEq/L   Chloride 112  96 - 112 mEq/L   CO2 11 (*) 19 - 32 mEq/L   Glucose, Bld 77  70 - 99 mg/dL   BUN 161 (*) 6 - 23 mg/dL   Creatinine, Ser 0.96 (*) 0.50 - 1.35 mg/dL   Calcium 7.6 (*) 8.4 - 10.5 mg/dL   GFR calc non Af Amer 11 (*) >90 mL/min   GFR calc Af Amer 13 (*) >90 mL/min   Comment: (NOTE)     The eGFR has been calculated using the CKD EPI equation.     This  calculation has not been validated in all clinical situations.     eGFR's persistently <90 mL/min signify possible Chronic Kidney     Disease.  CBC     Status: Abnormal   Collection Time    08/18/13  9:50 PM      Result Value Range   WBC 36.8 (*) 4.0 - 10.5 K/uL   RBC 3.21 (*) 4.22 - 5.81 MIL/uL   Hemoglobin 9.7 (*) 13.0 - 17.0 g/dL   HCT 04.5 (*) 40.9 - 81.1 %   MCV 85.0  78.0 - 100.0 fL   MCH 30.2  26.0 - 34.0 pg   MCHC 35.5  30.0 - 36.0 g/dL   RDW 91.4 (*) 78.2 - 95.6 %   Platelets 150  150 - 400 K/uL  GLUCOSE, CAPILLARY     Status: None   Collection Time    08/18/13 10:03 PM      Result Value Range   Glucose-Capillary 93  70 - 99 mg/dL  GLUCOSE, CAPILLARY     Status: Abnormal   Collection Time    08/19/13 12:10 AM      Result Value Range   Glucose-Capillary 116 (*) 70 - 99 mg/dL   Comment 1 Notify RN    GLUCOSE, CAPILLARY     Status: Abnormal   Collection Time    08/19/13  4:00 AM      Result Value Range   Glucose-Capillary 105 (*) 70 - 99 mg/dL  CBC     Status: Abnormal   Collection Time    08/19/13  5:00 AM      Result Value Range   WBC 30.0 (*) 4.0 - 10.5 K/uL   RBC 3.45 (*) 4.22 - 5.81 MIL/uL   Hemoglobin 10.1 (*) 13.0 - 17.0 g/dL   HCT 21.3 (*) 08.6 - 57.8 %   MCV 85.5  78.0 - 100.0 fL   MCH 29.3  26.0 - 34.0 pg   MCHC 34.2  30.0 - 36.0 g/dL   RDW 46.9 (*) 62.9 - 52.8 %  Platelets 153  150 - 400 K/uL  COMPREHENSIVE METABOLIC PANEL     Status: Abnormal   Collection Time    08/19/13  5:00 AM      Result Value Range   Sodium 140  135 - 145 mEq/L   Potassium 5.2 (*) 3.5 - 5.1 mEq/L   Chloride 114 (*) 96 - 112 mEq/L   CO2 12 (*) 19 - 32 mEq/L   Glucose, Bld 105 (*) 70 - 99 mg/dL   BUN 295 (*) 6 - 23 mg/dL   Creatinine, Ser 6.21 (*) 0.50 - 1.35 mg/dL   Calcium 7.8 (*) 8.4 - 10.5 mg/dL   Total Protein 5.7 (*) 6.0 - 8.3 g/dL   Albumin 1.5 (*) 3.5 - 5.2 g/dL   AST 53 (*) 0 - 37 U/L   ALT 47  0 - 53 U/L   Alkaline Phosphatase 312 (*) 39 - 117 U/L   Total  Bilirubin 1.1  0.3 - 1.2 mg/dL   GFR calc non Af Amer 11 (*) >90 mL/min   GFR calc Af Amer 13 (*) >90 mL/min   Comment: (NOTE)     The eGFR has been calculated using the CKD EPI equation.     This calculation has not been validated in all clinical situations.     eGFR's persistently <90 mL/min signify possible Chronic Kidney     Disease.  GLUCOSE, CAPILLARY     Status: None   Collection Time    08/19/13  8:17 AM      Result Value Range   Glucose-Capillary 99  70 - 99 mg/dL  GLUCOSE, CAPILLARY     Status: None   Collection Time    08/19/13 12:11 PM      Result Value Range   Glucose-Capillary 98  70 - 99 mg/dL   Comment 1 Documented in Chart     Comment 2 Notify RN    BASIC METABOLIC PANEL     Status: Abnormal   Collection Time    08/20/13  5:00 AM      Result Value Range   Sodium 144  135 - 145 mEq/L   Potassium 5.2 (*) 3.5 - 5.1 mEq/L   Chloride 119 (*) 96 - 112 mEq/L   CO2 13 (*) 19 - 32 mEq/L   Glucose, Bld 88  70 - 99 mg/dL   BUN 97 (*) 6 - 23 mg/dL   Creatinine, Ser 3.08 (*) 0.50 - 1.35 mg/dL   Calcium 8.4  8.4 - 65.7 mg/dL   GFR calc non Af Amer 14 (*) >90 mL/min   GFR calc Af Amer 16 (*) >90 mL/min   Comment: (NOTE)     The eGFR has been calculated using the CKD EPI equation.     This calculation has not been validated in all clinical situations.     eGFR's persistently <90 mL/min signify possible Chronic Kidney     Disease.  CBC     Status: Abnormal   Collection Time    08/20/13  5:00 AM      Result Value Range   WBC 18.6 (*) 4.0 - 10.5 K/uL   RBC 3.44 (*) 4.22 - 5.81 MIL/uL   Hemoglobin 10.2 (*) 13.0 - 17.0 g/dL   HCT 84.6 (*) 96.2 - 95.2 %   MCV 85.2  78.0 - 100.0 fL   MCH 29.7  26.0 - 34.0 pg   MCHC 34.8  30.0 - 36.0 g/dL   RDW 84.1 (*) 32.4 - 40.1 %  Platelets 163  150 - 400 K/uL  MAGNESIUM     Status: None   Collection Time    08/20/13  5:00 AM      Result Value Range   Magnesium 2.2  1.5 - 2.5 mg/dL  PHOSPHORUS     Status: Abnormal   Collection  Time    08/20/13  5:00 AM      Result Value Range   Phosphorus 5.6 (*) 2.3 - 4.6 mg/dL      Component Value Date/Time   SDES BLOOD LEFT HAND 08/18/2013 2145   SPECREQUEST BOTTLES DRAWN AEROBIC AND ANAEROBIC 10CC EA 08/18/2013 2145   CULT  Value:        BLOOD CULTURE RECEIVED NO GROWTH TO DATE CULTURE WILL BE HELD FOR 5 DAYS BEFORE ISSUING A FINAL NEGATIVE REPORT Performed at [facility] 08/18/2013 2145   REPTSTATUS PENDING 08/18/2013 2145   Ir Perc Nephrostomy Left  08/19/2013   CLINICAL DATA:  Septic shock, gram negative bacteremia and left hydronephrosis secondary to an obstructing ureteral calculus.  EXAM: 1. ULTRASOUND GUIDANCE FOR PUNCTURE OF THE LEFT RENAL COLLECTING SYSTEM. 2. LEFT PERCUTANEOUS NEPHROSTOMY TUBE PLACEMENT.  MEDICATIONS: No additional medications were administered.  ANESTHESIA/SEDATION: 1.0 mg IV Versed; 25 mcg IV Fentanyl.  Total Moderate Sedation Time  ONE 3 minutes  CONTRAST:  10 ml Omnipaque 300  COMPARISON:  None.  FLUOROSCOPY TIME:  30 seconds.  PROCEDURE: The procedure, risks, benefits, and alternatives were explained to the patient. Questions regarding the procedure were encouraged and answered. The patient understands and consents to the procedure.  The left flank region was prepped with Betadine in a sterile fashion, and a sterile drape was applied covering the operative field. A sterile gown and sterile gloves were used for the procedure. Local anesthesia was provided with 1% Lidocaine.  Ultrasound was used to localize the left kidney. Under direct ultrasound guidance, a 21 gauge needle was advanced into the renal collecting system. Ultrasound image documentation was performed. Aspiration of urine sample was performed followed by contrast injection. A sample of aspirated urine was sent for culture analysis.  A transitional dilator was advanced over a guidewire. Percutaneous tract dilatation was then performed over the guidewire. A 10 -French percutaneous  nephrostomy tube was then advanced and formed in the collecting system. Catheter position was confirmed by fluoroscopy after contrast injection.  The catheter was secured at the skin with a Prolene retention suture and Stat-Lock device. A gravity bag was placed.  COMPLICATIONS: None.  FINDINGS: Ultrasound confirms significant left hydronephrosis. After needle puncture, there was immediate return of overtly purulent fluid consistent with pyonephrosis. After placement of the 10 French nephrostomy tubes, there is rapid drainage of purulent fluid.  IMPRESSION: Left percutaneous nephrostomy tube placement. There was evidence of overt pyonephrosis. A fluid sample was sent for culture analysis. A 10 French nephrostomy tube was placed, formed in the renal pelvis and connected to gravity bag drainage.   Electronically Signed   By: Irish Lack M.D.   On: 08/19/2013 19:58   Ir US Guide Bx Asp/drain  08/19/2013   CLINICAL DATA:  Septic shock, gram negative bacteremia and left hydronephrosis secondary to an obstructing ureteral calculus.  EXAM: 1. ULTRASOUND GUIDANCE FOR PUNCTURE OF THE LEFT RENAL COLLECTING SYSTEM. 2. LEFT PERCUTANEOUS NEPHROSTOMY TUBE PLACEMENT.  MEDICATIONS: No additional medications were administered.  ANESTHESIA/SEDATION: 1.0 mg IV Versed; 25 mcg IV Fentanyl.  Total Moderate Sedation Time  ONE 3 minutes  CONTRAST:  10 ml Omnipaque 300  COMPARISON:  None.  FLUOROSCOPY TIME:  30 seconds.  PROCEDURE: The procedure, risks, benefits, and alternatives were explained to the patient. Questions regarding the procedure were encouraged and answered. The patient understands and consents to the procedure.  The left flank region was prepped with Betadine in a sterile fashion, and a sterile drape was applied covering the operative field. A sterile gown and sterile gloves were used for the procedure. Local anesthesia was provided with 1% Lidocaine.  Ultrasound was used to localize the left kidney. Under direct  ultrasound guidance, a 21 gauge needle was advanced into the renal collecting system. Ultrasound image documentation was performed. Aspiration of urine sample was performed followed by contrast injection. A sample of aspirated urine was sent for culture analysis.  A transitional dilator was advanced over a guidewire. Percutaneous tract dilatation was then performed over the guidewire. A 10 -French percutaneous nephrostomy tube was then advanced and formed in the collecting system. Catheter position was confirmed by fluoroscopy after contrast injection.  The catheter was secured at the skin with a Prolene retention suture and Stat-Lock device. A gravity bag was placed.  COMPLICATIONS: None.  FINDINGS: Ultrasound confirms significant left hydronephrosis. After needle puncture, there was immediate return of overtly purulent fluid consistent with pyonephrosis. After placement of the 10 French nephrostomy tubes, there is rapid drainage of purulent fluid.  IMPRESSION: Left percutaneous nephrostomy tube placement. There was evidence of overt pyonephrosis. A fluid sample was sent for culture analysis. A 10 French nephrostomy tube was placed, formed in the renal pelvis and connected to gravity bag drainage.   Electronically Signed   By: Irish Lack M.D.   On: 08/19/2013 19:58   Dg Chest Port 1 View  08/18/2013   CLINICAL DATA:  Central line placement  EXAM: PORTABLE CHEST - 1 VIEW  COMPARISON:  08/17/2013  FINDINGS: Left internal jugular central line has been placed with tip over the anticipated position of the junction of the brachiocephalic vein and superior vena cava. No pneumothorax. Heart size is normal. Lungs are clear.  IMPRESSION: Central line as described above with no pneumothorax   Electronically Signed   By: Esperanza Heir M.D.   On: 08/18/2013 20:58     Recent Results (from the past 720 hour(s))  MRSA PCR SCREENING     Status: None   Collection Time    08/18/13  4:18 PM      Result Value Range  Status   MRSA by PCR NEGATIVE  NEGATIVE Final   Comment:            The GeneXpert MRSA Assay (FDA     approved for NASAL specimens     only), is one component of a     comprehensive MRSA colonization     surveillance program. It is not     intended to diagnose MRSA     infection nor to guide or     monitor treatment for     MRSA infections.  URINE CULTURE     Status: None   Collection Time    08/18/13  5:13 PM      Result Value Range Status   Specimen Description URINE, CLEAN CATCH   Final   Special Requests NONE   Final   Culture  Setup Time     Final   Value: 08/19/2013 00:11     Performed at [facility] Count     Final   Value: 70,000 COLONIES/ML  Performed at [facility]     Final   Value: ESCHERICHIA COLI     Performed at [facility]   Report Status PENDING   Incomplete  CULTURE, ROUTINE-ABSCESS     Status: None   Collection Time    08/18/13  6:49 PM      Result Value Range Status   Specimen Description ABSCESS LEFT KIDNEY   Final   Special Requests NONE   Final   Gram Stain     Final   Value: ABUNDANT WBC PRESENT,BOTH PMN AND MONONUCLEAR     NO SQUAMOUS EPITHELIAL CELLS SEEN     MODERATE GRAM NEGATIVE RODS     Performed at [facility]   Culture     Final   Value: ABUNDANT GRAM NEGATIVE RODS     Performed at [facility]   Report Status PENDING   Incomplete  ANAEROBIC CULTURE     Status: None   Collection Time    08/18/13  6:54 PM      Result Value Range Status   Specimen Description ABSCESS LEFT KIDNEY   Final   Special Requests NONE   Final   Gram Stain     Final   Value: ABUNDANT WBC PRESENT,BOTH PMN AND MONONUCLEAR     NO SQUAMOUS EPITHELIAL CELLS SEEN     MODERATE GRAM NEGATIVE RODS     Performed at [facility]   Culture     Final   Value: NO ANAEROBES ISOLATED; CULTURE IN PROGRESS FOR 5 DAYS     Performed at [facility]   Report Status PENDING   Incomplete  CULTURE,  BLOOD (ROUTINE X 2)     Status: None   Collection Time    08/18/13  7:05 PM      Result Value Range Status   Specimen Description BLOOD RIGHT HAND   Final   Special Requests BOTTLES DRAWN AEROBIC AND ANAEROBIC 10CC   Final   Culture  Setup Time     Final   Value: 08/19/2013 00:10     Performed at [facility]   Culture     Final   Value:        BLOOD CULTURE RECEIVED NO GROWTH TO DATE CULTURE WILL BE HELD FOR 5 DAYS BEFORE ISSUING A FINAL NEGATIVE REPORT     Performed at [facility]   Report Status PENDING   Incomplete  CULTURE, BLOOD (ROUTINE X 2)     Status: None   Collection Time    08/18/13  9:45 PM      Result Value Range Status   Specimen Description BLOOD LEFT HAND   Final   Special Requests BOTTLES DRAWN AEROBIC AND ANAEROBIC 10CC EA   Final   Culture  Setup Time     Final   Value: 08/19/2013 02:54     Performed at [facility]   Culture     Final   Value:        BLOOD CULTURE RECEIVED NO GROWTH TO DATE CULTURE WILL BE HELD FOR 5 DAYS BEFORE ISSUING A FINAL NEGATIVE REPORT     Performed at [facility] Devices   Report Status PENDING   Incomplete     Impression/Recommendation  67 year old with complicated UTI, renal abscess and bacteremia with ESBL E coli  #1 ESBL UTI, renal abscess with bacteremia and septic shock  E coli was S to carbapenems and in vitro to  cefoxitin but otherwise R to ALL other abx tested  --continue imipenem for now --dc central line --give catheter holiday --he should receive IV carbapenem therapy for 2 weeks MINIMUM beyond date of first negative culture AND until resolution of his renal abscess, which may take quite a bit longer I spent greater than 60 minutes with the patient including greater than 50% of time in face to face counsel of the patient and in coordination of their care.     #2 Screening: check for HIV and Hepatitis   Thank you so much for this interesting consult  [facility] [phone] (pager) [phone] (office) 08/20/2013, 4:18 PM  Paulette Blanch Dam 08/20/2013, 4:18 PM

## 2013-08-20 NOTE — Progress Notes (Signed)
CRITICAL VALUE ALERT  Critical value received:  Blood culture positive   Date of notification:  08/20/13  Time of notification:   1100  Critical value read back:yes  Nurse who received alert:   Jahseh Lucchese, RN  MD notified (1st page):  RIZWAN & ALLISON, NP  Time of first page:  1110  MD notified (2nd page):  Time of second page:  Responding MD:    Time MD responded:

## 2013-08-20 NOTE — Progress Notes (Signed)
Utilization Review Completed.  

## 2013-08-20 NOTE — Progress Notes (Signed)
ANTIBIOTIC CONSULT NOTE - INITIAL  Pharmacy Consult for primaxin Indication: rule out sepsis  No Known Allergies  Patient Measurements: Height: 5\' 11"  (180.3 cm) Weight: 242 lb 11.6 oz (110.1 kg) IBW/kg (Calculated) : 75.3 Adjusted Body Weight:   Vital Signs: Temp: 98.1 F (36.7 C) (11/25 1235) Temp src: Oral (11/25 1235) BP: 111/66 mmHg (11/25 1235) Pulse Rate: 100 (11/25 1235) Intake/Output from previous day: 11/24 0701 - 11/25 0700 In: 3154.5 [P.O.:120; I.V.:3034.5] Out: 3775 [Urine:3775] Intake/Output from this shift: Total I/O In: 965 [P.O.:240; I.V.:625; IV Piggyback:100] Out: 1075 [Urine:1075]  Labs:  Recent Labs  08/18/13 2150 08/19/13 0500 08/20/13 0500  WBC 36.8* 30.0* 18.6*  HGB 9.7* 10.1* 10.2*  PLT 150 153 163  CREATININE 4.91* 4.91* 4.08*   Estimated Creatinine Clearance: 22.2 ml/min (by C-G formula based on Cr of 4.08). No results found for this basename: VANCOTROUGH, Leodis Binet, VANCORANDOM, GENTTROUGH, GENTPEAK, GENTRANDOM, TOBRATROUGH, TOBRAPEAK, TOBRARND, AMIKACINPEAK, AMIKACINTROU, AMIKACIN,  in the last 72 hours   Microbiology: Recent Results (from the past 720 hour(s))  MRSA PCR SCREENING     Status: None   Collection Time    08/18/13  4:18 PM      Result Value Range Status   MRSA by PCR NEGATIVE  NEGATIVE Final   Comment:            The GeneXpert MRSA Assay (FDA     approved for NASAL specimens     only), is one component of a     comprehensive MRSA colonization     surveillance program. It is not     intended to diagnose MRSA     infection nor to guide or     monitor treatment for     MRSA infections.  URINE CULTURE     Status: None   Collection Time    08/18/13  5:13 PM      Result Value Range Status   Specimen Description URINE, CLEAN CATCH   Final   Special Requests NONE   Final   Culture  Setup Time     Final   Value: 08/19/2013 00:11     Performed at [facility] Count     Final   Value: 70,000  COLONIES/ML     Performed at [facility]   Culture     Final   Value: ESCHERICHIA COLI     Performed at [facility]   Report Status PENDING   Incomplete  CULTURE, ROUTINE-ABSCESS     Status: None   Collection Time    08/18/13  6:49 PM      Result Value Range Status   Specimen Description ABSCESS LEFT KIDNEY   Final   Special Requests NONE   Final   Gram Stain     Final   Value: ABUNDANT WBC PRESENT,BOTH PMN AND MONONUCLEAR     NO SQUAMOUS EPITHELIAL CELLS SEEN     MODERATE GRAM NEGATIVE RODS     Performed at [facility]   Culture     Final   Value: ABUNDANT GRAM NEGATIVE RODS     Performed at [facility]   Report Status PENDING   Incomplete  ANAEROBIC CULTURE     Status: None   Collection Time    08/18/13  6:54 PM      Result Value Range Status   Specimen Description ABSCESS LEFT KIDNEY   Final   Special Requests NONE   Final   Gram Stain  Final   Value: ABUNDANT WBC PRESENT,BOTH PMN AND MONONUCLEAR     NO SQUAMOUS EPITHELIAL CELLS SEEN     MODERATE GRAM NEGATIVE RODS     Performed at [facility]   Culture     Final   Value: NO ANAEROBES ISOLATED; CULTURE IN PROGRESS FOR 5 DAYS     Performed at [facility]   Report Status PENDING   Incomplete  CULTURE, BLOOD (ROUTINE X 2)     Status: None   Collection Time    08/18/13  7:05 PM      Result Value Range Status   Specimen Description BLOOD RIGHT HAND   Final   Special Requests BOTTLES DRAWN AEROBIC AND ANAEROBIC 10CC   Final   Culture  Setup Time     Final   Value: 08/19/2013 00:10     Performed at [facility]   Culture     Final   Value:        BLOOD CULTURE RECEIVED NO GROWTH TO DATE CULTURE WILL BE HELD FOR 5 DAYS BEFORE ISSUING A FINAL NEGATIVE REPORT     Performed at [facility]   Report Status PENDING   Incomplete  CULTURE, BLOOD (ROUTINE X 2)     Status: None   Collection Time    08/18/13  9:45 PM      Result Value Range Status    Specimen Description BLOOD LEFT HAND   Final   Special Requests BOTTLES DRAWN AEROBIC AND ANAEROBIC 10CC EA   Final   Culture  Setup Time     Final   Value: 08/19/2013 02:54     Performed at [facility]   Culture     Final   Value:        BLOOD CULTURE RECEIVED NO GROWTH TO DATE CULTURE WILL BE HELD FOR 5 DAYS BEFORE ISSUING A FINAL NEGATIVE REPORT     Performed at [facility]   Report Status PENDING   Incomplete    Medications:  Anti-infectives   Start     Dose/Rate Route Frequency Ordered Stop   08/20/13 1200  imipenem-cilastatin (PRIMAXIN) 250 mg in sodium chloride 0.9 % 100 mL IVPB     250 mg 200 mL/hr over 30 Minutes Intravenous 4 times per day 08/20/13 1132     08/18/13 2200  ceFEPIme (MAXIPIME) 1 g in dextrose 5 % 50 mL IVPB  Status:  Discontinued     1 g 100 mL/hr over 30 Minutes Intravenous Every 12 hours 08/18/13 1846 08/18/13 1847   08/18/13 2000  ceFEPIme (MAXIPIME) 1 g in dextrose 5 % 50 mL IVPB  Status:  Discontinued     1 g 100 mL/hr over 30 Minutes Intravenous Every 24 hours 08/18/13 1728 08/18/13 1846   08/18/13 1900  ceFEPIme (MAXIPIME) 1 g in dextrose 5 % 50 mL IVPB  Status:  Discontinued     1 g 100 mL/hr over 30 Minutes Intravenous Every 24 hours 08/18/13 1847 08/20/13 1132     Assessment: 67 yom previously on cefepime for gram negative sepsis. Now changing to primaxin. Pt is afebrile and WBC is down to 18.6. Scr also slightly down to 4.08.   Cefepime 11/22 >>11/25 Vanc 11/22 >> 11/23 (1g received) Primaxin 11/25>>  11/23 L-Kidney abscess: GNR 11/23 Blood >>NGTD 11/23 Urine >>70K ecoli 11/23 MRSA PCR neg 11/22 Blood ([place]) >> GNR 11/22 Urine ([place]) >> 20K GNR  Goal of Therapy:  Eradication of infection  Plan:  1. Primaxin 250mg  IV Q6H 2. F/u renal fxn, C&S, clinical status  Lajuanna Pompa, Drake Leach 08/20/2013,1:40 PM

## 2013-08-20 NOTE — Progress Notes (Signed)
[facility] Progress Note [place] TEAM 1 - Stepdown ICU Team   Fernando Stoiber ZOX:[id number] DOB: 06/20/1946 DOA: 08/18/2013 PCP: [name], MD  Brief narrative: 67 year old male with history of hypertension, anemia, history of bladder cancer , hyperlipidemia, tobacco abuse, nephrolithiasis was originally admitted on 08/17/13 at [facility] hospital. Patient was apparently having issues with hematuria and UTI as an outpatient and was being treated with ciprofloxacin and Augmentin. Patient was lethargic on the day of admission and hypotensive with BP of 74/44. He was admitted to the ICU with septic shock and was found to have gram-negative sepsis ( final blood cultures are still pending). Ultrasound of the abdomen did not show any evidence for obstruction in the gallbladder area but was noted to have 1 cm stone with hydronephrosis. CT of the abdomen done today showed minimal cholelithiasis, severe left hydronephrosis secondary to 15 x 7 MM calculus in the middle portion of the left ureter. Urology was consulted and patient was seen by Dr. Yevonne Aline at [facility] who felt given patient's severe sepsis picture, significant leukocytosis, elevated creatinine and large proximal ureteral stone, he needs a nephrostomy tube. Risk of stent would be inability to pass the stent or it might not fully drain his urinary tract and hence a nephrostomy tube was felt more appropriate. Patient was transferred [facility] for emergentinterventional radiology consultation for nephrostomy tube.   After admission he developed refractory hypotension that required pressors so was transferred to ICU. Prior to this he had undergone successful placement of a percutaneous nephrostomy tube by IR with frank purulence noted.    Assessment/Plan:    Severe sepsis -BP soft with mild peristant tachycardia -source is urinary tract -cont supportive care -anbx's narrowed to ESBL focus so expect SBP to improve in next  48 hrs    Hydronephrosis, left/Renal abscess -post perc. nephro tube -? Needs Urology eval here or dc out with perc nephro tube to follow up with Urology at [place]    Bacteremia due to ESBL Escherichia coli -initially treated with Cefepime -fianl cx called 11/25 from [place] with E. Coli positive ESBL -d/w ID- recommend change to Imipenem, dc CL that was placed 11/23 and obtain 3rd set of blood cx's after CL out     Acute renal failure -baseline Scr unknown but suspect has some CKD -excellent uop/drain output -likely obstructive uropathy due to ? Renal stone causing hydronephrosis and infection    Acute respiratory failure with hypoxia -due to sepsis, ARF and volume overload from prior resuscitative efforts -now on RA    Dehydration -cont IVF hydration esp with soft BP -follow lytes    PVD (peripheral vascular disease)    Hypertension -hold home meds   DVT prophylaxis: SCDs Code Status: Full Family Communication: Patient Disposition Plan/Expected LOS: Transfer to telemetry Isolation: Contact isolation for ESBL positive Escherichia coli bacteremia Nutritional Status: Acute protein calorie malnutrition related to recent sepsis  Consultants: PCCM ID IR  Procedures: - 11/24 Left IJ CVC  - xxx Post left percutaneous nephrostomy.  - 11/23? Peripheral  CULTURES:  11/23 blood culture>>> ESBL positive E. Coli (2/2) from  11/23 aerobic culture>>>  11/23 urine culture>>>  Antibiotics: Cefepime 11/23 >>> 11/25 Primaxin 11/25 >>>  HPI/Subjective: Patient alert and up in chair. No specific complaints. Primarily endorses continued nausea and does not wish diet to be advanced.  Objective: Blood pressure 111/66, pulse 100, temperature 98.1 F (36.7 C), temperature source Oral, resp. rate 18, height 5\' 11"  (1.803 m), weight 242 lb  11.6 oz (110.1 kg), SpO2 93.00%.  Intake/Output Summary (Last 24 hours) at 08/20/13 1346 Last data filed at 08/20/13 1255  Gross  per 24 hour  Intake   3335 ml  Output   3425 ml  Net    -90 ml     Exam: General: No acute respiratory distress Lungs: Clear to auscultation bilaterally without wheezes or crackles, RA Cardiovascular: Regular rate and rhythm without murmur gallop or rub normal S1 and S2, no peripheral edema or JVD Abdomen: Nontender, nondistended, soft, bowel sounds positive, no rebound, no ascites, no appreciable mass Genitourinary; Left percutaneous nephrostomy tube patent with clear yellow urine in bag Musculoskeletal: No significant cyanosis, clubbing of bilateral lower extremities Neurological: Alert and oriented x 3, moves all extremities x 4 without focal neurological deficits, CN 2-12 intact  Scheduled Meds:  Scheduled Meds: . imipenem-cilastatin  250 mg Intravenous Q6H  . sodium chloride  10 mL Intravenous Q12H   Continuous Infusions: . sodium chloride 125 mL/hr at 08/20/13 0604  . norepinephrine (LEVOPHED) Adult infusion Stopped (08/19/13 6045)  . vasopressin (PITRESSIN) infusion - *FOR SHOCK* Stopped (08/19/13 1037)    **Reviewed in detail by the Attending Physician  Data Reviewed: Basic Metabolic Panel:  Recent Labs Lab 08/18/13 1657 08/18/13 2150 08/19/13 0500 08/20/13 0500  NA 135 138 140 144  K 5.1 4.9 5.2* 5.2*  CL 111 112 114* 119*  CO2 10* 11* 12* 13*  GLUCOSE 81 77 105* 88  BUN 117* 109* 107* 97*  CREATININE 5.23* 4.91* 4.91* 4.08*  CALCIUM 8.1* 7.6* 7.8* 8.4  MG  --   --   --  2.2  PHOS  --   --   --  5.6*   Liver Function Tests:  Recent Labs Lab 08/19/13 0500  AST 53*  ALT 47  ALKPHOS 312*  BILITOT 1.1  PROT 5.7*  ALBUMIN 1.5*   No results found for this basename: LIPASE, AMYLASE,  in the last 168 hours No results found for this basename: AMMONIA,  in the last 168 hours CBC:  Recent Labs Lab 08/18/13 1657 08/18/13 2150 08/19/13 0500 08/20/13 0500  WBC 11.1* 36.8* 30.0* 18.6*  HGB 10.9* 9.7* 10.1* 10.2*  HCT 31.7* 27.3* 29.5* 29.3*  MCV  85.2 85.0 85.5 85.2  PLT 151 150 153 163   Cardiac Enzymes:  Recent Labs Lab 08/18/13 2150  TROPONINI <0.30   BNP (last 3 results) No results found for this basename: PROBNP,  in the last 8760 hours CBG:  Recent Labs Lab 08/18/13 2203 08/19/13 0010 08/19/13 0400 08/19/13 0817 08/19/13 1211  GLUCAP 93 116* 105* 99 98    Recent Results (from the past 240 hour(s))  MRSA PCR SCREENING     Status: None   Collection Time    08/18/13  4:18 PM      Result Value Range Status   MRSA by PCR NEGATIVE  NEGATIVE Final   Comment:            The GeneXpert MRSA Assay (FDA     approved for NASAL specimens     only), is one component of a     comprehensive MRSA colonization     surveillance program. It is not     intended to diagnose MRSA     infection nor to guide or     monitor treatment for     MRSA infections.  URINE CULTURE     Status: None   Collection Time    08/18/13  5:13 PM  Result Value Range Status   Specimen Description URINE, CLEAN CATCH   Final   Special Requests NONE   Final   Culture  Setup Time     Final   Value: 08/19/2013 00:11     Performed at [facility] Count     Final   Value: 70,000 COLONIES/ML     Performed at [facility]   Culture     Final   Value: ESCHERICHIA COLI     Performed at [facility]   Report Status PENDING   Incomplete  CULTURE, ROUTINE-ABSCESS     Status: None   Collection Time    08/18/13  6:49 PM      Result Value Range Status   Specimen Description ABSCESS LEFT KIDNEY   Final   Special Requests NONE   Final   Gram Stain     Final   Value: ABUNDANT WBC PRESENT,BOTH PMN AND MONONUCLEAR     NO SQUAMOUS EPITHELIAL CELLS SEEN     MODERATE GRAM NEGATIVE RODS     Performed at [facility]   Culture     Final   Value: ABUNDANT GRAM NEGATIVE RODS     Performed at [facility]   Report Status PENDING   Incomplete  ANAEROBIC CULTURE     Status: None   Collection Time     08/18/13  6:54 PM      Result Value Range Status   Specimen Description ABSCESS LEFT KIDNEY   Final   Special Requests NONE   Final   Gram Stain     Final   Value: ABUNDANT WBC PRESENT,BOTH PMN AND MONONUCLEAR     NO SQUAMOUS EPITHELIAL CELLS SEEN     MODERATE GRAM NEGATIVE RODS     Performed at [facility]   Culture     Final   Value: NO ANAEROBES ISOLATED; CULTURE IN PROGRESS FOR 5 DAYS     Performed at [facility]   Report Status PENDING   Incomplete  CULTURE, BLOOD (ROUTINE X 2)     Status: None   Collection Time    08/18/13  7:05 PM      Result Value Range Status   Specimen Description BLOOD RIGHT HAND   Final   Special Requests BOTTLES DRAWN AEROBIC AND ANAEROBIC 10CC   Final   Culture  Setup Time     Final   Value: 08/19/2013 00:10     Performed at [facility]   Culture     Final   Value:        BLOOD CULTURE RECEIVED NO GROWTH TO DATE CULTURE WILL BE HELD FOR 5 DAYS BEFORE ISSUING A FINAL NEGATIVE REPORT     Performed at [facility]   Report Status PENDING   Incomplete  CULTURE, BLOOD (ROUTINE X 2)     Status: None   Collection Time    08/18/13  9:45 PM      Result Value Range Status   Specimen Description BLOOD LEFT HAND   Final   Special Requests BOTTLES DRAWN AEROBIC AND ANAEROBIC 10CC EA   Final   Culture  Setup Time     Final   Value: 08/19/2013 02:54     Performed at [facility]   Culture     Final   Value:        BLOOD CULTURE RECEIVED NO GROWTH TO DATE CULTURE WILL BE HELD FOR 5 DAYS  BEFORE ISSUING A FINAL NEGATIVE REPORT     Performed at [facility]   Report Status PENDING   Incomplete     Studies:  Recent x-ray studies have been reviewed in detail by the Attending Physician     Junious Silk, ANP [facility] Office  [phone] Pager [phone]  **If unable to reach the above provider after paging please contact the Flow Manager @ [phone]  On-Call/Text Page:      [email or web address]       password [email or web address]  If 7PM-7AM, please contact night-coverage [email or web address] Password [email or web address] 08/20/2013, 1:46 PM   LOS: 2 days   I have examined the patient, reviewed the chart and modified the above note which I agree with.   Laekyn Rayos,MD [phone] 08/20/2013, 6:55 PM

## 2013-08-20 NOTE — Progress Notes (Signed)
Unable to start a PIV. IV team is called.

## 2013-08-21 DIAGNOSIS — N390 Urinary tract infection, site not specified: Secondary | ICD-10-CM

## 2013-08-21 LAB — CULTURE, BLOOD (SINGLE)

## 2013-08-21 LAB — URINE CULTURE: Colony Count: 70000

## 2013-08-21 LAB — CULTURE, ROUTINE-ABSCESS

## 2013-08-21 MED ORDER — AMLODIPINE BESYLATE 5 MG PO TABS
5.0000 mg | ORAL_TABLET | Freq: Every day | ORAL | Status: DC
  Administered 2013-08-21 – 2013-08-23 (×3): 5 mg via ORAL
  Filled 2013-08-21 (×4): qty 1

## 2013-08-21 NOTE — Progress Notes (Signed)
Patient in accelerated junctional on telemetry, HR 93.  Donnamarie Poag, NP notified.  Will continue to monitor.

## 2013-08-21 NOTE — Progress Notes (Signed)
[facility] Progress Note    Nathan Rivera ZOX:[id number] DOB: 1945-12-02 DOA: 08/18/2013 PCP: [name], MD  Brief narrative: 67 year old male with history of hypertension, anemia, history of bladder cancer , hyperlipidemia, tobacco abuse, nephrolithiasis was originally admitted on 08/17/13 at [facility] hospital. Patient was apparently having issues with hematuria and UTI as an outpatient and was being treated with ciprofloxacin and Augmentin. Patient was lethargic on the day of admission and hypotensive with BP of 74/44. He was admitted to the ICU with septic shock and was found to have gram-negative sepsis ( final blood cultures are still pending). Ultrasound of the abdomen did not show any evidence for obstruction in the gallbladder area but was noted to have 1 cm stone with hydronephrosis. CT of the abdomen done today showed minimal cholelithiasis, severe left hydronephrosis secondary to 15 x 7 MM calculus in the middle portion of the left ureter. Urology was consulted and patient was seen by Dr. Yevonne Aline at [facility] who felt given patient's severe sepsis picture, significant leukocytosis, elevated creatinine and large proximal ureteral stone, he needs a nephrostomy tube. Risk of stent would be inability to pass the stent or it might not fully drain his urinary tract and hence a nephrostomy tube was felt more appropriate. Patient was transferred to [facility] for emergent interventional radiology consultation for nephrostomy tube.   After admission he developed refractory hypotension that required pressors so was transferred to ICU. Prior to this he had undergone successful placement of a percutaneous nephrostomy tube by IR with frank purulence noted.    Assessment/Plan:    Severe sepsis -resolved -source was urinary tract -cont supportive care -ID following- he should receive IV carbapenem therapy for 2 weeks MINIMUM beyond date of first negative culture AND until  resolution of his renal abscess, which may take quite a bit longer   Hydronephrosis, left/Renal abscess -post perc. nephro tube - dc out with perc nephro tube to follow up with Urology at [facility]- has been following for 1 year    Bacteremia due to ESBL Escherichia coli -initially treated with Cefepime -fianl cx called 11/25 from [place] with E. Coli positive ESBL -d/w ID- recommend change to Imipenem, d/c CL that was placed 11/23 and obtain 3rd set of blood cx's after CL out     Acute renal failure -baseline Scr unknown but suspect has some CKD -excellent uop/drain output -likely obstructive uropathy due to ? Renal stone causing hydronephrosis and infection    Acute respiratory failure with hypoxia -due to sepsis, ARF and volume overload from prior resuscitative efforts -now on RA    Dehydration -cont IVF hydration esp with soft BP -follow lytes    PVD (peripheral vascular disease)    Hypertension -resume home meds- one at a time   DVT prophylaxis: SCDs Code Status: Full Family Communication: Patient Isolation: Contact isolation for ESBL positive Escherichia coli bacteremia Nutritional Status: Acute protein calorie malnutrition related to recent sepsis  Consultants: PCCM ID IR  Procedures: - 11/24 Left IJ CVC - d/c 11/25 - xxx Post left percutaneous nephrostomy.    CULTURES:  11/23 blood culture>>> ESBL positive E. Coli (2/2) from [place] 11/23 aerobic culture>>>  11/23 urine culture>>>  Antibiotics: Cefepime 11/23 >>> 11/25 Primaxin 11/25 >>>  HPI/Subjective: Feeling tired, no SOB, no CP No appetite  Objective: Blood pressure 146/68, pulse 98, temperature 97.8 F (36.6 C), temperature source Oral, resp. rate 18, height 5\' 11"  (1.803 m), weight 110.859 kg (244 lb 6.4 oz), SpO2 98.00%.  Intake/Output  Summary (Last 24 hours) at 08/21/13 0901 Last data filed at 08/21/13 0442  Gross per 24 hour  Intake   1315 ml  Output   2700 ml  Net  -1385 ml      Exam: General: No acute respiratory distress Lungs: Clear to auscultation bilaterally without wheezes or crackles, RA Cardiovascular: Regular rate and rhythm without murmur gallop or rub normal S1 and S2, no peripheral edema or JVD Abdomen: mild tenderness, nondistended, soft, bowel sounds positive, no rebound, no ascites, no appreciable mass Genitourinary; Left percutaneous nephrostomy tube patent with clear yellow urine in bag Musculoskeletal: No significant cyanosis, clubbing of bilateral lower extremities Neurological: Alert and oriented x 3, moves all extremities x 4 without focal neurological deficits, CN 2-12 intact  Scheduled Meds:  Scheduled Meds: . imipenem-cilastatin  250 mg Intravenous Q6H  . sodium chloride  10 mL Intravenous Q12H   Continuous Infusions: . sodium chloride 125 mL/hr at 08/20/13 0604      Data Reviewed: Basic Metabolic Panel:  Recent Labs Lab 08/18/13 1657 08/18/13 2150 08/19/13 0500 08/20/13 0500  NA 135 138 140 144  K 5.1 4.9 5.2* 5.2*  CL 111 112 114* 119*  CO2 10* 11* 12* 13*  GLUCOSE 81 77 105* 88  BUN 117* 109* 107* 97*  CREATININE 5.23* 4.91* 4.91* 4.08*  CALCIUM 8.1* 7.6* 7.8* 8.4  MG  --   --   --  2.2  PHOS  --   --   --  5.6*   Liver Function Tests:  Recent Labs Lab 08/19/13 0500  AST 53*  ALT 47  ALKPHOS 312*  BILITOT 1.1  PROT 5.7*  ALBUMIN 1.5*   No results found for this basename: LIPASE, AMYLASE,  in the last 168 hours No results found for this basename: AMMONIA,  in the last 168 hours CBC:  Recent Labs Lab 08/18/13 1657 08/18/13 2150 08/19/13 0500 08/20/13 0500  WBC 11.1* 36.8* 30.0* 18.6*  HGB 10.9* 9.7* 10.1* 10.2*  HCT 31.7* 27.3* 29.5* 29.3*  MCV 85.2 85.0 85.5 85.2  PLT 151 150 153 163   Cardiac Enzymes:  Recent Labs Lab 08/18/13 2150  TROPONINI <0.30   BNP (last 3 results) No results found for this basename: PROBNP,  in the last 8760 hours CBG:  Recent Labs Lab 08/18/13 2203  08/19/13 0010 08/19/13 0400 08/19/13 0817 08/19/13 1211  GLUCAP 93 116* 105* 99 98    Recent Results (from the past 240 hour(s))  MRSA PCR SCREENING     Status: None   Collection Time    08/18/13  4:18 PM      Result Value Range Status   MRSA by PCR NEGATIVE  NEGATIVE Final   Comment:            The GeneXpert MRSA Assay (FDA     approved for NASAL specimens     only), is one component of a     comprehensive MRSA colonization     surveillance program. It is not     intended to diagnose MRSA     infection nor to guide or     monitor treatment for     MRSA infections.  URINE CULTURE     Status: None   Collection Time    08/18/13  5:13 PM      Result Value Range Status   Specimen Description URINE, CLEAN CATCH   Final   Special Requests NONE   Final   Culture  Setup Time  Final   Value: 08/19/2013 00:11     Performed at [facility] Count     Final   Value: 70,000 COLONIES/ML     Performed at [facility]   Culture     Final   Value: ESCHERICHIA COLI     Note: Confirmed Extended Spectrum Beta-Lactamase Producer (ESBL) CEFOXITIN SENSITIVE <=4     Performed at [facility]   Report Status 08/21/2013 FINAL   Final   Organism ID, Bacteria ESCHERICHIA COLI   Final  CULTURE, ROUTINE-ABSCESS     Status: None   Collection Time    08/18/13  6:49 PM      Result Value Range Status   Specimen Description ABSCESS LEFT KIDNEY   Final   Special Requests NONE   Final   Gram Stain     Final   Value: ABUNDANT WBC PRESENT,BOTH PMN AND MONONUCLEAR     NO SQUAMOUS EPITHELIAL CELLS SEEN     MODERATE GRAM NEGATIVE RODS     Performed at [facility]   Culture     Final   Value: ABUNDANT ESCHERICHIA COLI     Note: Confirmed Extended Spectrum Beta-Lactamase Producer (ESBL) CRITICAL RESULT CALLED TO, READ BACK BY AND VERIFIED WITH: ALBERT R 08/21/13 AT 810 BY [facility]     Performed at [facility]   Report Status 08/21/2013 FINAL   Final    Organism ID, Bacteria ESCHERICHIA COLI   Final  ANAEROBIC CULTURE     Status: None   Collection Time    08/18/13  6:54 PM      Result Value Range Status   Specimen Description ABSCESS LEFT KIDNEY   Final   Special Requests NONE   Final   Gram Stain     Final   Value: ABUNDANT WBC PRESENT,BOTH PMN AND MONONUCLEAR     NO SQUAMOUS EPITHELIAL CELLS SEEN     MODERATE GRAM NEGATIVE RODS     Performed at [facility]   Culture     Final   Value: NO ANAEROBES ISOLATED; CULTURE IN PROGRESS FOR 5 DAYS     Performed at [facility]   Report Status PENDING   Incomplete  CULTURE, BLOOD (ROUTINE X 2)     Status: None   Collection Time    08/18/13  7:05 PM      Result Value Range Status   Specimen Description BLOOD RIGHT HAND   Final   Special Requests BOTTLES DRAWN AEROBIC AND ANAEROBIC 10CC   Final   Culture  Setup Time     Final   Value: 08/19/2013 00:10     Performed at [facility]   Culture     Final   Value:        BLOOD CULTURE RECEIVED NO GROWTH TO DATE CULTURE WILL BE HELD FOR 5 DAYS BEFORE ISSUING A FINAL NEGATIVE REPORT     Performed at [facility]   Report Status PENDING   Incomplete  CULTURE, BLOOD (ROUTINE X 2)     Status: None   Collection Time    08/18/13  9:45 PM      Result Value Range Status   Specimen Description BLOOD LEFT HAND   Final   Special Requests BOTTLES DRAWN AEROBIC AND ANAEROBIC 10CC EA   Final   Culture  Setup Time     Final   Value: 08/19/2013 02:54     Performed at [facility]  Culture     Final   Value:        BLOOD CULTURE RECEIVED NO GROWTH TO DATE CULTURE WILL BE HELD FOR 5 DAYS BEFORE ISSUING A FINAL NEGATIVE REPORT     Performed at [facility]   Report Status PENDING   Incomplete     Studies:  Recent x-ray studies have been reviewed in detail by the Attending Physician     Marlin Canary [facility] Pager [phone]   If 7PM-7AM, please contact  night-coverage [email or web address] Password [email or web address] 08/21/2013, 9:01 AM   LOS: 3 days

## 2013-08-21 NOTE — Evaluation (Signed)
Physical Therapy Evaluation Patient Details Name: Nathan Rivera MRN: [id number] DOB: 1945/11/29 Today's Date: 08/21/2013 Time: 4098-1191 PT Time Calculation (min): 19 min  PT Assessment / Plan / Recommendation History of Present Illness   pt is a 67 y.o. Independent male adm secondary to having issues with hematuria and UTI. Pt found to be lethargic and hypotensive on day of admission. Pt adm secondary to septic shock and was found to have gram-negative sepsis.   Clinical Impression  Pt adm secondary to above. Pt presents with decreased independence with mobility secondary to deficits below. Would benefit from skilled PT to address deficits below and help return to PLOF prior to returning home with wife. Pt encouraged to amb with RW and nursing while in acute setting. Pt deferred doing steps with therapy today; will attempt next session and possible sign off from mobility standpoint.     PT Assessment  Patient needs continued PT services    Follow Up Recommendations  No PT follow up;Supervision - Intermittent;Supervision for mobility/OOB    Does the patient have the potential to tolerate intense rehabilitation      Barriers to Discharge   no barriers     Equipment Recommendations  Rolling walker with 5" wheels;Other (comment) (pt may have one; family is checking)    Recommendations for Other Services     Frequency Min 3X/week    Precautions / Restrictions Precautions Precautions: None Restrictions Weight Bearing Restrictions: No   Pertinent Vitals/Pain Did not rate pain; c/o pain in back at surgical site.       Mobility  Bed Mobility Bed Mobility: Not assessed (pt sitting EOB) Details for Bed Mobility Assistance: pt sitting EOB with family present; waiting for nurse Transfers Transfers: Sit to Stand;Stand to Sit Sit to Stand: 5: Supervision;From chair/3-in-1;With armrests Stand to Sit: 5: Supervision;To bed Details for Transfer Assistance: supervision for  safety Ambulation/Gait Ambulation/Gait Assistance: 5: Supervision Ambulation Distance (Feet): 20 Feet Assistive device: Rolling walker Ambulation/Gait Assistance Details: cues for management of RW and for safe sequencing; pt impusive and continud to pick up RW to advance it  Gait Pattern: Within Functional Limits Gait velocity: encouraged to decrease to safe speed at this time  Stairs: No Wheelchair Mobility Wheelchair Mobility: No         PT Diagnosis: Acute pain;Difficulty walking  PT Problem List: Decreased mobility;Decreased knowledge of use of DME;Pain;Decreased safety awareness;Decreased activity tolerance PT Treatment Interventions: DME instruction;Gait training;Stair training;Functional mobility training;Therapeutic activities;Therapeutic exercise;Balance training;Neuromuscular re-education;Patient/family education     PT Goals(Current goals can be found in the care plan section) Acute Rehab PT Goals Patient Stated Goal: to go home  PT Goal Formulation: With patient Time For Goal Achievement: 08/28/13 Potential to Achieve Goals: Good  Visit Information  Last PT Received On: 08/21/13 Assistance Needed: +1       Prior Functioning  Home Living Family/patient expects to be discharged to:: Private residence Living Arrangements: Spouse/significant other Available Help at Discharge: Family;Available 24 hours/day Type of Home: House Home Access: Stairs to enter [employer] of Steps: 3 Entrance Stairs-Rails: None Home Layout: One level Home Equipment: Walker - 2 wheels;Other (comment);Cane - single point;Shower seat (possibly has RW) Additional Comments: pt has walk in shower and handicap height toilet seats Prior Function Level of Independence: Independent Comments: pt drives and is independent  Communication Communication: No difficulties    Cognition  Cognition Arousal/Alertness: Awake/alert Behavior During Therapy: WFL for tasks  assessed/performed Overall Cognitive Status: Within Functional Limits for tasks assessed  Extremity/Trunk Assessment Upper Extremity Assessment Upper Extremity Assessment: Overall WFL for tasks assessed Lower Extremity Assessment Lower Extremity Assessment: Overall WFL for tasks assessed Cervical / Trunk Assessment Cervical / Trunk Assessment: Normal   Balance Balance Balance Assessed: Yes Static Sitting Balance Static Sitting - Balance Support: Feet supported;No upper extremity supported Static Sitting - Level of Assistance: 7: Independent Static Sitting - Comment/# of Minutes: pt sitting EOB ~4 min; left sitting EOB with nursing present   End of Session PT - End of Session Equipment Utilized During Treatment: Gait belt Activity Tolerance: Patient tolerated treatment well Patient left: in bed;with call bell/phone within reach;with family/visitor present Nurse Communication: Mobility status  GP     Donell Sievert, [place] [phone] 08/21/2013, 2:27 PM

## 2013-08-21 NOTE — Progress Notes (Signed)
[facility]  Day # 2 imipenem  Subjective: No new complaints   Antibiotics:  Anti-infectives   Start     Dose/Rate Route Frequency Ordered Stop   08/20/13 1200  imipenem-cilastatin (PRIMAXIN) 250 mg in sodium chloride 0.9 % 100 mL IVPB     250 mg 200 mL/hr over 30 Minutes Intravenous 4 times per day 08/20/13 1132     08/18/13 2200  ceFEPIme (MAXIPIME) 1 g in dextrose 5 % 50 mL IVPB  Status:  Discontinued     1 g 100 mL/hr over 30 Minutes Intravenous Every 12 hours 08/18/13 1846 08/18/13 1847   08/18/13 2000  ceFEPIme (MAXIPIME) 1 g in dextrose 5 % 50 mL IVPB  Status:  Discontinued     1 g 100 mL/hr over 30 Minutes Intravenous Every 24 hours 08/18/13 1728 08/18/13 1846   08/18/13 1900  ceFEPIme (MAXIPIME) 1 g in dextrose 5 % 50 mL IVPB  Status:  Discontinued     1 g 100 mL/hr over 30 Minutes Intravenous Every 24 hours 08/18/13 1847 08/20/13 1132      Medications: Scheduled Meds: . amLODipine  5 mg Oral Daily  . imipenem-cilastatin  250 mg Intravenous Q6H  . sodium chloride  10 mL Intravenous Q12H   Continuous Infusions: . sodium chloride 125 mL/hr at 08/20/13 0604   PRN Meds:.acetaminophen, acetaminophen, HYDROmorphone (DILAUDID) injection, ondansetron (ZOFRAN) IV, ondansetron, sodium chloride, sodium chloride, sodium chloride   Objective: Weight change:   Intake/Output Summary (Last 24 hours) at 08/21/13 1553 Last data filed at 08/21/13 1532  Gross per 24 hour  Intake    240 ml  Output   3775 ml  Net  -3535 ml   Blood pressure 130/71, pulse 96, temperature 97.8 F (36.6 C), temperature source Oral, resp. rate 18, height 5\' 11"  (1.803 m), weight 244 lb 6.4 oz (110.859 kg), SpO2 97.00%. Temp:  [97.8 F (36.6 C)-98.1 F (36.7 C)] 97.8 F (36.6 C) (11/26 1359) Pulse Rate:  [96-104] 96 (11/26 1359) Resp:  [18-20] 18 (11/26 1359) BP: (129-146)/(68-71) 130/71 mmHg (11/26 1359) SpO2:  [95 %-98 %] 97 % (11/26 1359) Weight:  [244 lb 6.4  oz (110.859 kg)] 244 lb 6.4 oz (110.859 kg) (11/25 1955)  Physical Exam: General: Alert and awake, oriented x3, not in any acute distress.  HEENT: anicteric sclera, pupils reactive to light and accommodation, EOMI, oropharynx clear and without exudate  CVS regular rate, normal r, no murmur rubs or gallops  Chest: clear to auscultation bilaterally, no wheezing, rales or rhonchi  Abdomen: soft Diffusely tendernormal bowel sounds, neprhostomy tube with amber fluid  Extremities: no clubbing or edema noted bilaterally  Skin: no rashes  Neuro: nonfocal, strength and sensation intact   Lab Results:  Recent Labs  08/19/13 0500 08/20/13 0500  WBC 30.0* 18.6*  HGB 10.1* 10.2*  HCT 29.5* 29.3*  PLT 153 163    BMET  Recent Labs  08/19/13 0500 08/20/13 0500  NA 140 144  K 5.2* 5.2*  CL 114* 119*  CO2 12* 13*  GLUCOSE 105* 88  BUN 107* 97*  CREATININE 4.91* 4.08*  CALCIUM 7.8* 8.4    Micro Results: Recent Results (from the past 240 hour(s))  MRSA PCR SCREENING     Status: None   Collection Time    08/18/13  4:18 PM      Result Value Range Status   MRSA by PCR NEGATIVE  NEGATIVE Final   Comment:  The GeneXpert MRSA Assay (FDA     approved for NASAL specimens     only), is one component of a     comprehensive MRSA colonization     surveillance program. It is not     intended to diagnose MRSA     infection nor to guide or     monitor treatment for     MRSA infections.  URINE CULTURE     Status: None   Collection Time    08/18/13  5:13 PM      Result Value Range Status   Specimen Description URINE, CLEAN CATCH   Final   Special Requests NONE   Final   Culture  Setup Time     Final   Value: 08/19/2013 00:11     Performed at [facility] Count     Final   Value: 70,000 COLONIES/ML     Performed at [facility]   Culture     Final   Value: ESCHERICHIA COLI     Note: Confirmed Extended Spectrum Beta-Lactamase Producer (ESBL)  CEFOXITIN SENSITIVE <=4     Performed at [facility]   Report Status 08/21/2013 FINAL   Final   Organism ID, Bacteria ESCHERICHIA COLI   Final  CULTURE, ROUTINE-ABSCESS     Status: None   Collection Time    08/18/13  6:49 PM      Result Value Range Status   Specimen Description ABSCESS LEFT KIDNEY   Final   Special Requests NONE   Final   Gram Stain     Final   Value: ABUNDANT WBC PRESENT,BOTH PMN AND MONONUCLEAR     NO SQUAMOUS EPITHELIAL CELLS SEEN     MODERATE GRAM NEGATIVE RODS     Performed at [facility]   Culture     Final   Value: ABUNDANT ESCHERICHIA COLI     Note: Confirmed Extended Spectrum Beta-Lactamase Producer (ESBL) CRITICAL RESULT CALLED TO, READ BACK BY AND VERIFIED WITH: ALBERT R 08/21/13 AT 810 BY [facility]     Performed at [facility]   Report Status 08/21/2013 FINAL   Final   Organism ID, Bacteria ESCHERICHIA COLI   Final  ANAEROBIC CULTURE     Status: None   Collection Time    08/18/13  6:54 PM      Result Value Range Status   Specimen Description ABSCESS LEFT KIDNEY   Final   Special Requests NONE   Final   Gram Stain     Final   Value: ABUNDANT WBC PRESENT,BOTH PMN AND MONONUCLEAR     NO SQUAMOUS EPITHELIAL CELLS SEEN     MODERATE GRAM NEGATIVE RODS     Performed at [facility]   Culture     Final   Value: NO ANAEROBES ISOLATED; CULTURE IN PROGRESS FOR 5 DAYS     Performed at [facility]   Report Status PENDING   Incomplete  CULTURE, BLOOD (ROUTINE X 2)     Status: None   Collection Time    08/18/13  7:05 PM      Result Value Range Status   Specimen Description BLOOD RIGHT HAND   Final   Special Requests BOTTLES DRAWN AEROBIC AND ANAEROBIC 10CC   Final   Culture  Setup Time     Final   Value: 08/19/2013 00:10     Performed at [facility]   Culture     Final   Value:  BLOOD CULTURE RECEIVED NO GROWTH TO DATE CULTURE WILL BE HELD FOR 5 DAYS BEFORE ISSUING A FINAL NEGATIVE REPORT     Performed  at [facility]   Report Status PENDING   Incomplete  CULTURE, BLOOD (ROUTINE X 2)     Status: None   Collection Time    08/18/13  9:45 PM      Result Value Range Status   Specimen Description BLOOD LEFT HAND   Final   Special Requests BOTTLES DRAWN AEROBIC AND ANAEROBIC 10CC EA   Final   Culture  Setup Time     Final   Value: 08/19/2013 02:54     Performed at [facility]   Culture     Final   Value:        BLOOD CULTURE RECEIVED NO GROWTH TO DATE CULTURE WILL BE HELD FOR 5 DAYS BEFORE ISSUING A FINAL NEGATIVE REPORT     Performed at [facility]   Report Status PENDING   Incomplete    Studies/Results: No results found.    Assessment/Plan: Nathan Rivera is a 67 y.o. male  with complicated UTI, renal abscess and bacteremia with ESBL E coli   #1 ESBL UTI, renal abscess with bacteremia and septic shock  E coli was S to carbapenems and in vitro to cefoxitin but otherwise R to ALL other abx tested, central line now out   --continue imipenem for now  --recheck blood cultures  --give catheter holiday   --he should receive IV carbapenem therapy for 2 weeks MINIMUM beyond date of first negative culture AND until resolution of his renal abscess, which may take quite a bit longer. ie 4-6 weeks WITH repeat CT to assure it has resolved  I spent greater than 45 minutes with the patient including greater than 50% of time in face to face counsel of the patient and in coordination of their care. Pts case also discussed with my friend and former colleague Dr. Lina Sayre who had been contacted by friend of family on behalf of the pt.   #2 Screening: check for HIV and Hepatitis  Dr. Drue Second will be available for questions 08/22/13 thru 08/25/13 and Dr. Ninetta Lights taking over ID consults at [facility] in December     LOS: 3 days   Acey Lav 08/21/2013, 3:53 PM

## 2013-08-22 ENCOUNTER — Inpatient Hospital Stay (HOSPITAL_COMMUNITY): Payer: Medicare Other

## 2013-08-22 LAB — CBC
MCV: 84.8 fL (ref 78.0–100.0)
Platelets: 244 10*3/uL (ref 150–400)
RBC: 3.42 MIL/uL — ABNORMAL LOW (ref 4.22–5.81)
RDW: 17.7 % — ABNORMAL HIGH (ref 11.5–15.5)
WBC: 15.5 10*3/uL — ABNORMAL HIGH (ref 4.0–10.5)

## 2013-08-22 LAB — BASIC METABOLIC PANEL
CO2: 14 mEq/L — ABNORMAL LOW (ref 19–32)
Chloride: 125 mEq/L — ABNORMAL HIGH (ref 96–112)
Creatinine, Ser: 2.35 mg/dL — ABNORMAL HIGH (ref 0.50–1.35)
GFR calc Af Amer: 31 mL/min — ABNORMAL LOW (ref >90)
Potassium: 4.2 mEq/L (ref 3.5–5.1)
Sodium: 149 mEq/L — ABNORMAL HIGH (ref 135–145)

## 2013-08-22 MED ORDER — BISACODYL 10 MG RE SUPP
10.0000 mg | Freq: Every day | RECTAL | Status: DC
  Administered 2013-08-22: 10 mg via RECTAL
  Filled 2013-08-22: qty 1

## 2013-08-22 MED ORDER — BISACODYL 10 MG RE SUPP
10.0000 mg | Freq: Once | RECTAL | Status: AC
  Administered 2013-08-22: 10 mg via RECTAL
  Filled 2013-08-22: qty 1

## 2013-08-22 MED ORDER — POLYETHYLENE GLYCOL 3350 17 G PO PACK
17.0000 g | PACK | Freq: Every day | ORAL | Status: DC
  Administered 2013-08-22: 17 g via ORAL
  Filled 2013-08-22 (×3): qty 1

## 2013-08-22 MED ORDER — SODIUM CHLORIDE 0.45 % IV SOLN
INTRAVENOUS | Status: DC
Start: 2013-08-22 — End: 2013-08-24
  Administered 2013-08-22 (×2): via INTRAVENOUS

## 2013-08-22 NOTE — Progress Notes (Signed)
X ray shows ?ileus so will give suppository and change to clears.  Marlin Canary DO

## 2013-08-22 NOTE — Progress Notes (Signed)
Attempted to give pt scheduled dulcolax suppository but pt states it's too painful for him to roll over onto his side at this time. Pt could not tolerate it. Will reschedule for an hour later.  Kathlene November, [facility] [place]

## 2013-08-22 NOTE — Progress Notes (Signed)
[facility] Progress Note    Adoni Greenough ZOX:[id number] DOB: 1945/10/26 DOA: 08/18/2013 PCP: [name], MD  Brief narrative: 67 year old male with history of hypertension, anemia, history of bladder cancer , hyperlipidemia, tobacco abuse, nephrolithiasis was originally admitted on 08/17/13 at [facility] hospital. Patient was apparently having issues with hematuria and UTI as an outpatient and was being treated with ciprofloxacin and Augmentin. Patient was lethargic on the day of admission and hypotensive with BP of 74/44. He was admitted to the ICU with septic shock and was found to have gram-negative sepsis ( final blood cultures are still pending). Ultrasound of the abdomen did not show any evidence for obstruction in the gallbladder area but was noted to have 1 cm stone with hydronephrosis. CT of the abdomen done today showed minimal cholelithiasis, severe left hydronephrosis secondary to 15 x 7 MM calculus in the middle portion of the left ureter. Urology was consulted and patient was seen by Dr. Yevonne Aline at [facility] who felt given patient's severe sepsis picture, significant leukocytosis, elevated creatinine and large proximal ureteral stone, he needs a nephrostomy tube. Risk of stent would be inability to pass the stent or it might not fully drain his urinary tract and hence a nephrostomy tube was felt more appropriate. Patient was transferred to [facility] for emergent interventional radiology consultation for nephrostomy tube.   After admission he developed refractory hypotension that required pressors so was transferred to ICU. Prior to this he had undergone successful placement of a percutaneous nephrostomy tube by IR with frank purulence noted.    Assessment/Plan: abd pain -x ray and suppository -per patient, no BM since admission    Severe sepsis -resolved -source was urinary tract -cont supportive care -ID following- he should receive IV carbapenem therapy for 2  weeks MINIMUM beyond date of first negative culture AND until resolution of his renal abscess, which may take quite a bit longer. ie 4-6 weeks WITH repeat CT to assure it has resolved   Hydronephrosis, left/Renal abscess -post perc. nephro tube - dc out with perc nephro tube to follow up with Urology here as family declines to go back to [place]    Bacteremia due to ESBL Escherichia coli -initially treated with Cefepime -fianl cx called 11/25 from [place] with E. Coli positive ESBL -d/w ID- recommend change to Imipenem, d/c CL that was placed 11/23 and obtain 3rd set of blood cx's after CL out     Acute renal failure -baseline Scr unknown but suspect has some CKD- wife states baseline about 2.5 -excellent uop/drain output -likely obstructive uropathy due to ? Renal stone causing hydronephrosis and infection    Acute respiratory failure with hypoxia -due to sepsis, ARF and volume overload from prior resuscitative efforts -now on RA    Dehydration -cont IVF hydration esp with soft BP -follow lytes    PVD (peripheral vascular disease)    Hypertension -resume home meds- one at a time   DVT prophylaxis: SCDs Code Status: Full Family Communication: Patient Isolation: Contact isolation for ESBL positive Escherichia coli bacteremia Nutritional Status: Acute protein calorie malnutrition related to recent sepsis  Consultants: PCCM ID IR  Procedures: - 11/24 Left IJ CVC - d/c 11/25 - xxx Post left percutaneous nephrostomy.    CULTURES:  11/23 blood culture>>> ESBL positive E. Coli (2/2) from [place] 11/23 aerobic culture>>>  11/23 urine culture>>>  Antibiotics: Cefepime 11/23 >>> 11/25 Primaxin 11/25 >>>  HPI/Subjective: C/o lower abd pain  Objective: Blood pressure 136/73, pulse 89, temperature 98.7  F (37.1 C), temperature source Oral, resp. rate 18, height 5\' 11"  (1.803 m), weight 110.859 kg (244 lb 6.4 oz), SpO2 96.00%.  Intake/Output Summary (Last 24 hours)  at 08/22/13 0954 Last data filed at 08/22/13 0700  Gross per 24 hour  Intake   2060 ml  Output   4350 ml  Net  -2290 ml     Exam: General: No acute respiratory distress Lungs: Clear to auscultation bilaterally without wheezes or crackles, RA Cardiovascular: Regular rate and rhythm without murmur gallop or rub normal S1 and S2, no peripheral edema or JVD Abdomen: mild tenderness, nondistended, soft, bowel sounds positive, no rebound, no ascites, no appreciable mass Genitourinary; Left percutaneous nephrostomy tube patent with clear yellow urine in bag Musculoskeletal: No significant cyanosis, clubbing of bilateral lower extremities Neurological: Alert and oriented x 3, moves all extremities x 4 without focal neurological deficits, CN 2-12 intact  Scheduled Meds:  Scheduled Meds: . amLODipine  5 mg Oral Daily  . imipenem-cilastatin  250 mg Intravenous Q6H  . sodium chloride  10 mL Intravenous Q12H   Continuous Infusions: . sodium chloride 75 mL/hr at 08/22/13 0855      Data Reviewed: Basic Metabolic Panel:  Recent Labs Lab 08/18/13 1657 08/18/13 2150 08/19/13 0500 08/20/13 0500 08/22/13 0558  NA 135 138 140 144 149*  K 5.1 4.9 5.2* 5.2* 4.2  CL 111 112 114* 119* 125*  CO2 10* 11* 12* 13* 14*  GLUCOSE 81 77 105* 88 102*  BUN 117* 109* 107* 97* 67*  CREATININE 5.23* 4.91* 4.91* 4.08* 2.35*  CALCIUM 8.1* 7.6* 7.8* 8.4 8.0*  MG  --   --   --  2.2  --   PHOS  --   --   --  5.6*  --    Liver Function Tests:  Recent Labs Lab 08/19/13 0500  AST 53*  ALT 47  ALKPHOS 312*  BILITOT 1.1  PROT 5.7*  ALBUMIN 1.5*   No results found for this basename: LIPASE, AMYLASE,  in the last 168 hours No results found for this basename: AMMONIA,  in the last 168 hours CBC:  Recent Labs Lab 08/18/13 1657 08/18/13 2150 08/19/13 0500 08/20/13 0500  WBC 11.1* 36.8* 30.0* 18.6*  HGB 10.9* 9.7* 10.1* 10.2*  HCT 31.7* 27.3* 29.5* 29.3*  MCV 85.2 85.0 85.5 85.2  PLT 151 150  153 163   Cardiac Enzymes:  Recent Labs Lab 08/18/13 2150  TROPONINI <0.30   BNP (last 3 results) No results found for this basename: PROBNP,  in the last 8760 hours CBG:  Recent Labs Lab 08/18/13 2203 08/19/13 0010 08/19/13 0400 08/19/13 0817 08/19/13 1211  GLUCAP 93 116* 105* 99 98    Recent Results (from the past 240 hour(s))  MRSA PCR SCREENING     Status: None   Collection Time    08/18/13  4:18 PM      Result Value Range Status   MRSA by PCR NEGATIVE  NEGATIVE Final   Comment:            The GeneXpert MRSA Assay (FDA     approved for NASAL specimens     only), is one component of a     comprehensive MRSA colonization     surveillance program. It is not     intended to diagnose MRSA     infection nor to guide or     monitor treatment for     MRSA infections.  URINE CULTURE  Status: None   Collection Time    08/18/13  5:13 PM      Result Value Range Status   Specimen Description URINE, CLEAN CATCH   Final   Special Requests NONE   Final   Culture  Setup Time     Final   Value: 08/19/2013 00:11     Performed at [facility] Count     Final   Value: 70,000 COLONIES/ML     Performed at [facility]   Culture     Final   Value: ESCHERICHIA COLI     Note: Confirmed Extended Spectrum Beta-Lactamase Producer (ESBL) CEFOXITIN SENSITIVE <=4     Performed at [facility]   Report Status 08/21/2013 FINAL   Final   Organism ID, Bacteria ESCHERICHIA COLI   Final  CULTURE, ROUTINE-ABSCESS     Status: None   Collection Time    08/18/13  6:49 PM      Result Value Range Status   Specimen Description ABSCESS LEFT KIDNEY   Final   Special Requests NONE   Final   Gram Stain     Final   Value: ABUNDANT WBC PRESENT,BOTH PMN AND MONONUCLEAR     NO SQUAMOUS EPITHELIAL CELLS SEEN     MODERATE GRAM NEGATIVE RODS     Performed at [facility]   Culture     Final   Value: ABUNDANT ESCHERICHIA COLI     Note: Confirmed Extended  Spectrum Beta-Lactamase Producer (ESBL) CRITICAL RESULT CALLED TO, READ BACK BY AND VERIFIED WITH: ALBERT R 08/21/13 AT 810 BY [facility]     Performed at [facility]   Report Status 08/21/2013 FINAL   Final   Organism ID, Bacteria ESCHERICHIA COLI   Final  ANAEROBIC CULTURE     Status: None   Collection Time    08/18/13  6:54 PM      Result Value Range Status   Specimen Description ABSCESS LEFT KIDNEY   Final   Special Requests NONE   Final   Gram Stain     Final   Value: ABUNDANT WBC PRESENT,BOTH PMN AND MONONUCLEAR     NO SQUAMOUS EPITHELIAL CELLS SEEN     MODERATE GRAM NEGATIVE RODS     Performed at [facility]   Culture     Final   Value: NO ANAEROBES ISOLATED; CULTURE IN PROGRESS FOR 5 DAYS     Performed at [facility]   Report Status PENDING   Incomplete  CULTURE, BLOOD (ROUTINE X 2)     Status: None   Collection Time    08/18/13  7:05 PM      Result Value Range Status   Specimen Description BLOOD RIGHT HAND   Final   Special Requests BOTTLES DRAWN AEROBIC AND ANAEROBIC 10CC   Final   Culture  Setup Time     Final   Value: 08/19/2013 00:10     Performed at [facility]   Culture     Final   Value:        BLOOD CULTURE RECEIVED NO GROWTH TO DATE CULTURE WILL BE HELD FOR 5 DAYS BEFORE ISSUING A FINAL NEGATIVE REPORT     Performed at [facility]   Report Status PENDING   Incomplete  CULTURE, BLOOD (ROUTINE X 2)     Status: None   Collection Time    08/18/13  9:45 PM      Result Value Range Status  Specimen Description BLOOD LEFT HAND   Final   Special Requests BOTTLES DRAWN AEROBIC AND ANAEROBIC 10CC EA   Final   Culture  Setup Time     Final   Value: 08/19/2013 02:54     Performed at [facility]   Culture     Final   Value:        BLOOD CULTURE RECEIVED NO GROWTH TO DATE CULTURE WILL BE HELD FOR 5 DAYS BEFORE ISSUING A FINAL NEGATIVE REPORT     Performed at [facility]   Report Status PENDING   Incomplete           Marlin Canary [facility] Pager [phone]   If 7PM-7AM, please contact night-coverage [email or web address] Password [email or web address] 08/22/2013, 9:54 AM   LOS: 4 days

## 2013-08-23 DIAGNOSIS — D72829 Elevated white blood cell count, unspecified: Secondary | ICD-10-CM

## 2013-08-23 DIAGNOSIS — K560 Paralytic ileus: Secondary | ICD-10-CM

## 2013-08-23 DIAGNOSIS — K929 Disease of digestive system, unspecified: Secondary | ICD-10-CM

## 2013-08-23 LAB — ANAEROBIC CULTURE

## 2013-08-23 LAB — CBC
HCT: 36 % — ABNORMAL LOW (ref 39.0–52.0)
HCT: 34.1 % — ABNORMAL LOW (ref 39.0–52.0)
Hemoglobin: 12.2 g/dL — ABNORMAL LOW (ref 13.0–17.0)
Hemoglobin: 11.8 g/dL — ABNORMAL LOW (ref 13.0–17.0)
MCH: 29.2 pg (ref 26.0–34.0)
MCHC: 34.6 g/dL (ref 30.0–36.0)
MCV: 86.1 fL (ref 78.0–100.0)
MCV: 85.7 fL (ref 78.0–100.0)
Platelets: 328 10*3/uL (ref 150–400)
RBC: 4.18 MIL/uL — ABNORMAL LOW (ref 4.22–5.81)
RDW: 17.9 % — ABNORMAL HIGH (ref 11.5–15.5)
WBC: 44 10*3/uL — ABNORMAL HIGH (ref 4.0–10.5)

## 2013-08-23 LAB — PH, GASTRIC FLUID (GASTROCCULT CARD): pH, Gastric: POSITIVE

## 2013-08-23 LAB — BASIC METABOLIC PANEL
CO2: 14 mEq/L — ABNORMAL LOW (ref 19–32)
Chloride: 119 mEq/L — ABNORMAL HIGH (ref 96–112)
GFR calc non Af Amer: 22 mL/min — ABNORMAL LOW (ref >90)
Glucose, Bld: 122 mg/dL — ABNORMAL HIGH (ref 70–99)
Potassium: 4.8 mEq/L (ref 3.5–5.1)
Sodium: 147 mEq/L — ABNORMAL HIGH (ref 135–145)

## 2013-08-23 MED ORDER — PANTOPRAZOLE SODIUM 40 MG IV SOLR
40.0000 mg | Freq: Two times a day (BID) | INTRAVENOUS | Status: DC
  Administered 2013-08-23 – 2013-08-31 (×17): 40 mg via INTRAVENOUS
  Filled 2013-08-23 (×26): qty 40

## 2013-08-23 NOTE — Progress Notes (Signed)
PT Cancellation Note  Patient Details Name: Nathan Rivera MRN: [id number] DOB: [birth year]   Cancelled Treatment:    Reason Eval/Treat Not Completed: Medical issues which prohibited therapy. Spoke with RN, pt planned for NG tube today. RN states that pt is in increased amount of pain and would like [place] to hold. Will re-attempt to see pt at next available time.   Donnamarie Poag [place], [place] [phone] 08/23/2013, 10:04 AM

## 2013-08-23 NOTE — Progress Notes (Signed)
[facility] Progress Note    Nathan Rivera ZOX:[id number] DOB: 27-Jan-1946 DOA: 08/18/2013 PCP: [name], MD  Brief narrative: 67 year old male with history of hypertension, anemia, history of bladder cancer , hyperlipidemia, tobacco abuse, nephrolithiasis was originally admitted on 08/17/13 at [facility] hospital. Patient was apparently having issues with hematuria and UTI as an outpatient and was being treated with ciprofloxacin and Augmentin. Patient was lethargic on the day of admission and hypotensive with BP of 74/44. He was admitted to the ICU with septic shock and was found to have gram-negative sepsis ( final blood cultures are still pending). Ultrasound of the abdomen did not show any evidence for obstruction in the gallbladder area but was noted to have 1 cm stone with hydronephrosis. CT of the abdomen done today showed minimal cholelithiasis, severe left hydronephrosis secondary to 15 x 7 MM calculus in the middle portion of the left ureter. Urology was consulted and patient was seen by Dr. Yevonne Aline at [facility] who felt given patient's severe sepsis picture, significant leukocytosis, elevated creatinine and large proximal ureteral stone, he needs a nephrostomy tube. Risk of stent would be inability to pass the stent or it might not fully drain his urinary tract and hence a nephrostomy tube was felt more appropriate. Patient was transferred to [facility] for emergent interventional radiology consultation for nephrostomy tube.   After admission he developed refractory hypotension that required pressors so was transferred to ICU. Prior to this he had undergone successful placement of a percutaneous nephrostomy tube by IR with frank purulence noted.    Assessment/Plan: abd pain -x ray shows mild ileus- no BS but patient claims to be passing gas -+nausea -add NG tube -consider surgery consult if not better    Severe sepsis -resolved -source was urinary tract -cont  supportive care -ID following- he should receive IV carbapenem therapy for 2 weeks MINIMUM beyond date of first negative culture AND until resolution of his renal abscess, which may take quite a bit longer. ie 4-6 weeks WITH repeat CT to assure it has resolved   Hydronephrosis, left/Renal abscess -post perc. nephro tube - dc out with perc nephro tube to follow up with Urology here as family declines to go back to [place]    Bacteremia due to ESBL Escherichia coli -initially treated with Cefepime -fianl cx called 11/25 from [place] with E. Coli positive ESBL -d/w ID- recommend change to Imipenem, d/c CL that was placed 11/23 and obtain 3rd set of blood cx's after CL out     Acute renal failure -baseline Scr unknown but suspect has some CKD- wife states baseline about 2.5 -excellent uop/drain output -likely obstructive uropathy due to ? Renal stone causing hydronephrosis and infection    Acute respiratory failure with hypoxia -due to sepsis, ARF and volume overload from prior resuscitative efforts -now on RA    Dehydration -resolved    PVD (peripheral vascular disease)    Hypertension -resume home meds- one at a time   DVT prophylaxis: SCDs Code Status: Full Family Communication: Patient Isolation: Contact isolation for ESBL positive Escherichia coli bacteremia Nutritional Status: Acute protein calorie malnutrition related to recent sepsis  Consultants: PCCM ID IR  Procedures: - 11/24 Left IJ CVC - d/c 11/25 - xxx Post left percutaneous nephrostomy.    CULTURES:  11/23 blood culture>>> ESBL positive E. Coli (2/2) from [place] 11/23 aerobic culture>>>  11/23 urine culture>>>  Antibiotics: Cefepime 11/23 >>> 11/25 Primaxin 11/25 >>>  HPI/Subjective: Small BM yest Passing gas Still with  abd pain- all over now  Objective: Blood pressure 113/57, pulse 118, temperature 98.2 F (36.8 C), temperature source Oral, resp. rate 18, height 5\' 11"  (1.803 m), weight  110.859 kg (244 lb 6.4 oz), SpO2 92.00%.  Intake/Output Summary (Last 24 hours) at 08/23/13 0901 Last data filed at 08/23/13 7829  Gross per 24 hour  Intake 2253.75 ml  Output   1150 ml  Net 1103.75 ml     Exam: General: No acute respiratory distress Lungs: Clear to auscultation bilaterally without wheezes or crackles, RA Cardiovascular: Regular rate and rhythm without murmur gallop or rub normal S1 and S2, no peripheral edema or JVD Abdomen: mild tenderness, nondistended, soft, bowel sounds positive, no rebound, no ascites, no appreciable mass Genitourinary; Left percutaneous nephrostomy tube patent with clear yellow urine in bag Musculoskeletal: No significant cyanosis, clubbing of bilateral lower extremities Neurological: Alert and oriented x 3, moves all extremities x 4 without focal neurological deficits, CN 2-12 intact  Scheduled Meds:  Scheduled Meds: . amLODipine  5 mg Oral Daily  . bisacodyl  10 mg Rectal Daily  . imipenem-cilastatin  250 mg Intravenous Q6H  . polyethylene glycol  17 g Oral Daily  . sodium chloride  10 mL Intravenous Q12H   Continuous Infusions: . sodium chloride 75 mL/hr at 08/22/13 2128      Data Reviewed: Basic Metabolic Panel:  Recent Labs Lab 08/18/13 1657 08/18/13 2150 08/19/13 0500 08/20/13 0500 08/22/13 0558  NA 135 138 140 144 149*  K 5.1 4.9 5.2* 5.2* 4.2  CL 111 112 114* 119* 125*  CO2 10* 11* 12* 13* 14*  GLUCOSE 81 77 105* 88 102*  BUN 117* 109* 107* 97* 67*  CREATININE 5.23* 4.91* 4.91* 4.08* 2.35*  CALCIUM 8.1* 7.6* 7.8* 8.4 8.0*  MG  --   --   --  2.2  --   PHOS  --   --   --  5.6*  --    Liver Function Tests:  Recent Labs Lab 08/19/13 0500  AST 53*  ALT 47  ALKPHOS 312*  BILITOT 1.1  PROT 5.7*  ALBUMIN 1.5*   No results found for this basename: LIPASE, AMYLASE,  in the last 168 hours No results found for this basename: AMMONIA,  in the last 168 hours CBC:  Recent Labs Lab 08/18/13 1657 08/18/13 2150  08/19/13 0500 08/20/13 0500 08/22/13 0558  WBC 11.1* 36.8* 30.0* 18.6* 15.5*  HGB 10.9* 9.7* 10.1* 10.2* 10.0*  HCT 31.7* 27.3* 29.5* 29.3* 29.0*  MCV 85.2 85.0 85.5 85.2 84.8  PLT 151 150 153 163 244   Cardiac Enzymes:  Recent Labs Lab 08/18/13 2150  TROPONINI <0.30   BNP (last 3 results) No results found for this basename: PROBNP,  in the last 8760 hours CBG:  Recent Labs Lab 08/18/13 2203 08/19/13 0010 08/19/13 0400 08/19/13 0817 08/19/13 1211  GLUCAP 93 116* 105* 99 98    Recent Results (from the past 240 hour(s))  MRSA PCR SCREENING     Status: None   Collection Time    08/18/13  4:18 PM      Result Value Range Status   MRSA by PCR NEGATIVE  NEGATIVE Final   Comment:            The GeneXpert MRSA Assay (FDA     approved for NASAL specimens     only), is one component of a     comprehensive MRSA colonization     surveillance program. It is not  intended to diagnose MRSA     infection nor to guide or     monitor treatment for     MRSA infections.  URINE CULTURE     Status: None   Collection Time    08/18/13  5:13 PM      Result Value Range Status   Specimen Description URINE, CLEAN CATCH   Final   Special Requests NONE   Final   Culture  Setup Time     Final   Value: 08/19/2013 00:11     Performed at [facility] Count     Final   Value: 70,000 COLONIES/ML     Performed at [facility]   Culture     Final   Value: ESCHERICHIA COLI     Note: Confirmed Extended Spectrum Beta-Lactamase Producer (ESBL) CEFOXITIN SENSITIVE <=4     Performed at [facility]   Report Status 08/21/2013 FINAL   Final   Organism ID, Bacteria ESCHERICHIA COLI   Final  CULTURE, ROUTINE-ABSCESS     Status: None   Collection Time    08/18/13  6:49 PM      Result Value Range Status   Specimen Description ABSCESS LEFT KIDNEY   Final   Special Requests NONE   Final   Gram Stain     Final   Value: ABUNDANT WBC PRESENT,BOTH PMN AND  MONONUCLEAR     NO SQUAMOUS EPITHELIAL CELLS SEEN     MODERATE GRAM NEGATIVE RODS     Performed at [facility]   Culture     Final   Value: ABUNDANT ESCHERICHIA COLI     Note: Confirmed Extended Spectrum Beta-Lactamase Producer (ESBL) CRITICAL RESULT CALLED TO, READ BACK BY AND VERIFIED WITH: ALBERT R 08/21/13 AT 810 BY [facility]     Performed at [facility]   Report Status 08/21/2013 FINAL   Final   Organism ID, Bacteria ESCHERICHIA COLI   Final  ANAEROBIC CULTURE     Status: None   Collection Time    08/18/13  6:54 PM      Result Value Range Status   Specimen Description ABSCESS LEFT KIDNEY   Final   Special Requests NONE   Final   Gram Stain     Final   Value: ABUNDANT WBC PRESENT,BOTH PMN AND MONONUCLEAR     NO SQUAMOUS EPITHELIAL CELLS SEEN     MODERATE GRAM NEGATIVE RODS     Performed at [facility]   Culture     Final   Value: NO ANAEROBES ISOLATED; CULTURE IN PROGRESS FOR 5 DAYS     Performed at [facility]   Report Status PENDING   Incomplete  CULTURE, BLOOD (ROUTINE X 2)     Status: None   Collection Time    08/18/13  7:05 PM      Result Value Range Status   Specimen Description BLOOD RIGHT HAND   Final   Special Requests BOTTLES DRAWN AEROBIC AND ANAEROBIC 10CC   Final   Culture  Setup Time     Final   Value: 08/19/2013 00:10     Performed at [facility]   Culture     Final   Value:        BLOOD CULTURE RECEIVED NO GROWTH TO DATE CULTURE WILL BE HELD FOR 5 DAYS BEFORE ISSUING A FINAL NEGATIVE REPORT     Performed at [facility]   Report Status PENDING  Incomplete  CULTURE, BLOOD (ROUTINE X 2)     Status: None   Collection Time    08/18/13  9:45 PM      Result Value Range Status   Specimen Description BLOOD LEFT HAND   Final   Special Requests BOTTLES DRAWN AEROBIC AND ANAEROBIC 10CC EA   Final   Culture  Setup Time     Final   Value: 08/19/2013 02:54     Performed at [facility]   Culture     Final    Value:        BLOOD CULTURE RECEIVED NO GROWTH TO DATE CULTURE WILL BE HELD FOR 5 DAYS BEFORE ISSUING A FINAL NEGATIVE REPORT     Performed at [facility]   Report Status PENDING   Incomplete  CULTURE, BLOOD (ROUTINE X 2)     Status: None   Collection Time    08/20/13  4:40 PM      Result Value Range Status   Specimen Description BLOOD LEFT ARM   Final   Special Requests BOTTLES DRAWN AEROBIC ONLY 3CC   Final   Culture  Setup Time     Final   Value: 08/21/2013 02:13     Performed at [facility]   Culture     Final   Value:        BLOOD CULTURE RECEIVED NO GROWTH TO DATE CULTURE WILL BE HELD FOR 5 DAYS BEFORE ISSUING A FINAL NEGATIVE REPORT     Performed at [facility]   Report Status PENDING   Incomplete  CULTURE, BLOOD (ROUTINE X 2)     Status: None   Collection Time    08/20/13  4:55 PM      Result Value Range Status   Specimen Description BLOOD RIGHT ARM   Final   Special Requests BOTTLES DRAWN AEROBIC ONLY 10CC   Final   Culture  Setup Time     Final   Value: 08/21/2013 02:13     Performed at [facility]   Culture     Final   Value:        BLOOD CULTURE RECEIVED NO GROWTH TO DATE CULTURE WILL BE HELD FOR 5 DAYS BEFORE ISSUING A FINAL NEGATIVE REPORT     Performed at [facility]   Report Status PENDING   Incomplete          Marlin Canary [facility] Pager [phone]   If 7PM-7AM, please contact night-coverage [email or web address] Password [email or web address] 08/23/2013, 9:01 AM   LOS: 5 days

## 2013-08-23 NOTE — Progress Notes (Signed)
ANTIBIOTIC CONSULT NOTE - FOLLOW UP  Pharmacy Consult for Primaxin Indication: L Kidney Abscess  No Known Allergies  Patient Measurements: Height: 5\' 11"  (180.3 cm) Weight: 244 lb 6.4 oz (110.859 kg) IBW/kg (Calculated) : 75.3 Adjusted Body Weight:   Vital Signs: Temp: 98.2 F (36.8 C) (11/28 0500) Temp src: Oral (11/28 0500) BP: 113/57 mmHg (11/28 0500) Pulse Rate: 118 (11/28 0500) Intake/Output from previous day: 11/27 0701 - 11/28 0700 In: 2253.8 [P.O.:240; I.V.:1613.8; IV Piggyback:400] Out: 1150 [Urine:1150] Intake/Output from this shift:    Labs:  Recent Labs  08/22/13 0558  WBC 15.5*  HGB 10.0*  PLT 244  CREATININE 2.35*   Estimated Creatinine Clearance: 38.6 ml/min (by C-G formula based on Cr of 2.35). No results found for this basename: VANCOTROUGH, Leodis Binet, VANCORANDOM, GENTTROUGH, GENTPEAK, GENTRANDOM, TOBRATROUGH, TOBRAPEAK, TOBRARND, AMIKACINPEAK, AMIKACINTROU, AMIKACIN,  in the last 72 hours   Microbiology: Recent Results (from the past 720 hour(s))  MRSA PCR SCREENING     Status: None   Collection Time    08/18/13  4:18 PM      Result Value Range Status   MRSA by PCR NEGATIVE  NEGATIVE Final   Comment:            The GeneXpert MRSA Assay (FDA     approved for NASAL specimens     only), is one component of a     comprehensive MRSA colonization     surveillance program. It is not     intended to diagnose MRSA     infection nor to guide or     monitor treatment for     MRSA infections.  URINE CULTURE     Status: None   Collection Time    08/18/13  5:13 PM      Result Value Range Status   Specimen Description URINE, CLEAN CATCH   Final   Special Requests NONE   Final   Culture  Setup Time     Final   Value: 08/19/2013 00:11     Performed at [facility] Count     Final   Value: 70,000 COLONIES/ML     Performed at [facility]   Culture     Final   Value: ESCHERICHIA COLI     Note: Confirmed Extended Spectrum  Beta-Lactamase Producer (ESBL) CEFOXITIN SENSITIVE <=4     Performed at [facility]   Report Status 08/21/2013 FINAL   Final   Organism ID, Bacteria ESCHERICHIA COLI   Final  CULTURE, ROUTINE-ABSCESS     Status: None   Collection Time    08/18/13  6:49 PM      Result Value Range Status   Specimen Description ABSCESS LEFT KIDNEY   Final   Special Requests NONE   Final   Gram Stain     Final   Value: ABUNDANT WBC PRESENT,BOTH PMN AND MONONUCLEAR     NO SQUAMOUS EPITHELIAL CELLS SEEN     MODERATE GRAM NEGATIVE RODS     Performed at [facility]   Culture     Final   Value: ABUNDANT ESCHERICHIA COLI     Note: Confirmed Extended Spectrum Beta-Lactamase Producer (ESBL) CRITICAL RESULT CALLED TO, READ BACK BY AND VERIFIED WITH: ALBERT R 08/21/13 AT 810 BY [facility]     Performed at [facility]   Report Status 08/21/2013 FINAL   Final   Organism ID, Bacteria ESCHERICHIA COLI   Final  ANAEROBIC CULTURE  Status: None   Collection Time    08/18/13  6:54 PM      Result Value Range Status   Specimen Description ABSCESS LEFT KIDNEY   Final   Special Requests NONE   Final   Gram Stain     Final   Value: ABUNDANT WBC PRESENT,BOTH PMN AND MONONUCLEAR     NO SQUAMOUS EPITHELIAL CELLS SEEN     MODERATE GRAM NEGATIVE RODS     Performed at [facility]   Culture     Final   Value: NO ANAEROBES ISOLATED; CULTURE IN PROGRESS FOR 5 DAYS     Performed at [facility]   Report Status PENDING   Incomplete  CULTURE, BLOOD (ROUTINE X 2)     Status: None   Collection Time    08/18/13  7:05 PM      Result Value Range Status   Specimen Description BLOOD RIGHT HAND   Final   Special Requests BOTTLES DRAWN AEROBIC AND ANAEROBIC 10CC   Final   Culture  Setup Time     Final   Value: 08/19/2013 00:10     Performed at [facility]   Culture     Final   Value:        BLOOD CULTURE RECEIVED NO GROWTH TO DATE CULTURE WILL BE HELD FOR 5 DAYS BEFORE ISSUING A FINAL  NEGATIVE REPORT     Performed at [facility]   Report Status PENDING   Incomplete  CULTURE, BLOOD (ROUTINE X 2)     Status: None   Collection Time    08/18/13  9:45 PM      Result Value Range Status   Specimen Description BLOOD LEFT HAND   Final   Special Requests BOTTLES DRAWN AEROBIC AND ANAEROBIC 10CC EA   Final   Culture  Setup Time     Final   Value: 08/19/2013 02:54     Performed at [facility]   Culture     Final   Value:        BLOOD CULTURE RECEIVED NO GROWTH TO DATE CULTURE WILL BE HELD FOR 5 DAYS BEFORE ISSUING A FINAL NEGATIVE REPORT     Performed at [facility]   Report Status PENDING   Incomplete  CULTURE, BLOOD (ROUTINE X 2)     Status: None   Collection Time    08/20/13  4:40 PM      Result Value Range Status   Specimen Description BLOOD LEFT ARM   Final   Special Requests BOTTLES DRAWN AEROBIC ONLY 3CC   Final   Culture  Setup Time     Final   Value: 08/21/2013 02:13     Performed at [facility]   Culture     Final   Value:        BLOOD CULTURE RECEIVED NO GROWTH TO DATE CULTURE WILL BE HELD FOR 5 DAYS BEFORE ISSUING A FINAL NEGATIVE REPORT     Performed at [facility]   Report Status PENDING   Incomplete  CULTURE, BLOOD (ROUTINE X 2)     Status: None   Collection Time    08/20/13  4:55 PM      Result Value Range Status   Specimen Description BLOOD RIGHT ARM   Final   Special Requests BOTTLES DRAWN AEROBIC ONLY 10CC   Final   Culture  Setup Time     Final   Value: 08/21/2013 02:13  Performed at [facility]     Final   Value:        BLOOD CULTURE RECEIVED NO GROWTH TO DATE CULTURE WILL BE HELD FOR 5 DAYS BEFORE ISSUING A FINAL NEGATIVE REPORT     Performed at [facility]   Report Status PENDING   Incomplete    Anti-infectives   Start     Dose/Rate Route Frequency Ordered Stop   08/20/13 1200  imipenem-cilastatin (PRIMAXIN) 250 mg in sodium chloride 0.9 % 100 mL IVPB     250  mg 200 mL/hr over 30 Minutes Intravenous 4 times per day 08/20/13 1132     08/18/13 2200  ceFEPIme (MAXIPIME) 1 g in dextrose 5 % 50 mL IVPB  Status:  Discontinued     1 g 100 mL/hr over 30 Minutes Intravenous Every 12 hours 08/18/13 1846 08/18/13 1847   08/18/13 2000  ceFEPIme (MAXIPIME) 1 g in dextrose 5 % 50 mL IVPB  Status:  Discontinued     1 g 100 mL/hr over 30 Minutes Intravenous Every 24 hours 08/18/13 1728 08/18/13 1846   08/18/13 1900  ceFEPIme (MAXIPIME) 1 g in dextrose 5 % 50 mL IVPB  Status:  Discontinued     1 g 100 mL/hr over 30 Minutes Intravenous Every 24 hours 08/18/13 1847 08/20/13 1132      Assessment: 67 YOM with hematuria and UTI (txed outpt w/Cipro and Augmentin) with lethargy and hypotension, tranfx to [facility] 11/23 for emergent nephrostomy tube placement for severe left-sided hydronephrosis.   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 Coli (S: cefoxitin, imipenem, zosyn; R: all other abx) 11/23 Blood >>NGTD 11/23 Urine >> ESBL E.Coli (S: imipenem, nitrofurantoin; R: to all other abx) 11/23 MRSA PCR neg 11/22 Blood ([place]) >> GNR 11/22 Urine ([place]) >> 20K GNR  CV: Hx HTN, HLD - 113/57, HR 112-133 - Norvasc 5mg  (PTA losartan, simva)  Endo: No hx - Glucose 102, no insulin  GI/Nutr: LFTs trending down, Tbili WNL. Mild ileus, passing gas but +nausea. Plan NG tube.  Neuro: Hx OA, DJD - A&O  Renal: Hx CKD, bladder ca - Scr 4.08 > 2.35 , K 4.2, Phos 5.6, Mg WNL - s/p perc nephrostomy. UOP 0.4. I/O +2104.  Pulm: 92% RA, h/o tobacco  Heme/Onc: Hx bladder ca - HGB 10.0 PLT 244  Best Practices: SCDs   Goal of Therapy:  Coverage for ESBL Ecoli  Plan:  - Continue Primaxin 250mg  IV Q6H - F/u  renal fxn, C&S, clinical status and LOT  Charlesetta Milliron S. Merilynn Finland, PharmD, BCPS Clinical Staff Pharmacist Pager [phone]  Misty Stanley Stillinger 08/23/2013,9:59 AM

## 2013-08-23 NOTE — Progress Notes (Addendum)
[facility]  Day # 4 imipenem  Subjective: Afebrile. Had ileus yesterday, has NG now decompression.still has abdominal pain  But WBC increased to 40.8 from 15.5 over 24hr.   Antibiotics:  Anti-infectives   Start     Dose/Rate Route Frequency Ordered Stop   08/20/13 1200  imipenem-cilastatin (PRIMAXIN) 250 mg in sodium chloride 0.9 % 100 mL IVPB     250 mg 200 mL/hr over 30 Minutes Intravenous 4 times per day 08/20/13 1132     08/18/13 2200  ceFEPIme (MAXIPIME) 1 g in dextrose 5 % 50 mL IVPB  Status:  Discontinued     1 g 100 mL/hr over 30 Minutes Intravenous Every 12 hours 08/18/13 1846 08/18/13 1847   08/18/13 2000  ceFEPIme (MAXIPIME) 1 g in dextrose 5 % 50 mL IVPB  Status:  Discontinued     1 g 100 mL/hr over 30 Minutes Intravenous Every 24 hours 08/18/13 1728 08/18/13 1846   08/18/13 1900  ceFEPIme (MAXIPIME) 1 g in dextrose 5 % 50 mL IVPB  Status:  Discontinued     1 g 100 mL/hr over 30 Minutes Intravenous Every 24 hours 08/18/13 1847 08/20/13 1132      Medications: Scheduled Meds: . amLODipine  5 mg Oral Daily  . bisacodyl  10 mg Rectal Daily  . imipenem-cilastatin  250 mg Intravenous Q6H  . polyethylene glycol  17 g Oral Daily  . sodium chloride  10 mL Intravenous Q12H    Objective: Weight change:   Intake/Output Summary (Last 24 hours) at 08/23/13 1243 Last data filed at 08/23/13 0900  Gross per 24 hour  Intake 2253.75 ml  Output   1150 ml  Net 1103.75 ml   Blood pressure 107/75, pulse 113, temperature 98.1 F (36.7 C), temperature source Oral, resp. rate 20, height 5\' 11"  (1.803 m), weight 244 lb 6.4 oz (110.859 kg), SpO2 96.00%. Temp:  [97.2 F (36.2 C)-98.2 F (36.8 C)] 98.1 F (36.7 C) (11/28 1000) Pulse Rate:  [112-133] 113 (11/28 1000) Resp:  [18-20] 20 (11/28 1000) BP: (107-148)/(57-90) 107/75 mmHg (11/28 1000) SpO2:  [92 %-97 %] 96 % (11/28 1000)  Physical Exam: General: Alert and awake, oriented x3, not in any  acute distress.  HEENT: anicteric sclera, pupils reactive to light and accommodation, EOMI, oropharynx clear and without exudate  CVS regular rate, normal r, no murmur rubs or gallops  Chest: clear to auscultation bilaterally, no wheezing, rales or rhonchi  Abdomen: soft Diffusely tendernormal bowel sounds, neprhostomy tube with amber fluid  Extremities: no clubbing or edema noted bilaterally  Skin: no rashes  Neuro: nonfocal, strength and sensation intact   Lab Results:  Recent Labs  09-04-2013 0558 08/23/13 0930  WBC 15.5* 40.8*  HGB 10.0* 12.2*  HCT 29.0* 36.0*  PLT 244 309    BMET  Recent Labs  2013-09-04 0558 08/23/13 0930  NA 149* 147*  K 4.2 4.8  CL 125* 119*  CO2 14* 14*  GLUCOSE 102* 122*  BUN 67* 73*  CREATININE 2.35* 2.80*  CALCIUM 8.0* 8.6    Micro Results: 11/25 blood cx x 2: NGTD 11/23 blood cx NGTD 11/23 renal abscess fluid ; esbl ecoli 11/23 urine cx: 70,000cfu: esbl ecoli  Studies/Results: Dg Abd 2 Views  09/04/2013   CLINICAL DATA:  Generalized abdominal pain  EXAM: ABDOMEN - 2 VIEW  COMPARISON:  08/18/2013  FINDINGS: There is nonobstructive bowel gas pattern. 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 Left nephrostomy  catheter. No free abdominal air. Mild gaseous distended small bowel loops mid abdomen probable mild ileus.  IMPRESSION: Mild gaseous distended small bowel loops mid abdomen probable mild ileus. No air-fluid levels. Left nephrostomy catheter. No free abdominal air.   Electronically Signed   By: Natasha Mead M.D.   On: 08/22/2013 12:18      Assessment/Plan: Nathan Rivera is a 67 y.o. 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 ie 4-6 weeks WITH repeat CT to assure it has resolved  2) aki = on labs today, has slightly worsening Cr. Recommend repeat BMP, assess if need to give further IVF  3) marked leukocytosis = concern that he it is a prelude to other infection, such as c.difficile vs. Reactive to his ileus. Recommend to check cdiff pcr if the pateint starts to have diarrhea. Will add differential. If he has fever, please repeat blood cultures.   LOS: 5 days   Judyann Munson 08/23/2013, 12:43 PM

## 2013-08-23 NOTE — Progress Notes (Addendum)
Patient had NG tube placed with at least 1 cannister of "coffee-ground" fluid removed, gastrocult positive.  Hgb Stable, will check CBC again and decide on GI consult. Abd less distended, patient sleeping  Marlin Canary DO  5:52 PM update -patient's abd feeling better but still tender gastrocult + (after NG tube) -still awaiting CBC (ordered for 3 PM)- ? GI bleed but Hgb higher than yest- await repeat

## 2013-08-24 ENCOUNTER — Inpatient Hospital Stay (HOSPITAL_COMMUNITY): Payer: Medicare Other

## 2013-08-24 ENCOUNTER — Encounter (HOSPITAL_COMMUNITY): Payer: Medicare Other | Admitting: Anesthesiology

## 2013-08-24 ENCOUNTER — Encounter (HOSPITAL_COMMUNITY): Payer: Self-pay | Admitting: General Surgery

## 2013-08-24 ENCOUNTER — Inpatient Hospital Stay (HOSPITAL_COMMUNITY): Payer: Medicare Other | Admitting: Anesthesiology

## 2013-08-24 ENCOUNTER — Encounter (HOSPITAL_COMMUNITY)
Admission: AD | Disposition: A | Discharge status: Home Health | Payer: Self-pay | Source: Other Acute Inpatient Hospital | Attending: Internal Medicine

## 2013-08-24 DIAGNOSIS — J9600 Acute respiratory failure, unspecified whether with hypoxia or hypercapnia: Secondary | ICD-10-CM

## 2013-08-24 DIAGNOSIS — K261 Acute duodenal ulcer with perforation: Secondary | ICD-10-CM

## 2013-08-24 HISTORY — PX: LAPAROTOMY: SHX154

## 2013-08-24 LAB — POCT I-STAT 3, ART BLOOD GAS (G3+)
Acid-base deficit: 12 mmol/L — ABNORMAL HIGH (ref 0.0–2.0)
O2 Saturation: 95 %
Patient temperature: 97.8
TCO2: 14 mmol/L (ref 0–100)
pO2, Arterial: 84 mmHg (ref 80.0–100.0)

## 2013-08-24 LAB — BLOOD GAS, ARTERIAL
Acid-base deficit: 13.1 mmol/L — ABNORMAL HIGH (ref 0.0–2.0)
Bicarbonate: 13.8 mEq/L — ABNORMAL LOW (ref 20.0–24.0)
Drawn by: 36527
FIO2: 1 %
MECHVT: 600 mL
O2 Saturation: 92.9 %
Patient temperature: 98.6
RATE: 16 resp/min
TCO2: 15.1 mmol/L (ref 0–100)
pCO2 arterial: 40.1 mmHg (ref 35.0–45.0)
pH, Arterial: 7.164 — CL (ref 7.350–7.450)

## 2013-08-24 LAB — CBC
Hemoglobin: 11.4 g/dL — ABNORMAL LOW (ref 13.0–17.0)
MCH: 29.5 pg (ref 26.0–34.0)
MCHC: 34.7 g/dL (ref 30.0–36.0)
MCV: 85.2 fL (ref 78.0–100.0)
RBC: 3.86 MIL/uL — ABNORMAL LOW (ref 4.22–5.81)
RDW: 18 % — ABNORMAL HIGH (ref 11.5–15.5)

## 2013-08-24 LAB — DIFFERENTIAL
Basophils Relative: 0 % (ref 0–1)
Eosinophils Absolute: 0 10*3/uL (ref 0.0–0.7)
Lymphs Abs: 2.8 10*3/uL (ref 0.7–4.0)
Monocytes Absolute: 0.8 10*3/uL (ref 0.1–1.0)
Neutro Abs: 36.9 10*3/uL — ABNORMAL HIGH (ref 1.7–7.7)

## 2013-08-24 LAB — BASIC METABOLIC PANEL
CO2: 17 mEq/L — ABNORMAL LOW (ref 19–32)
Calcium: 8.5 mg/dL (ref 8.4–10.5)
Creatinine, Ser: 4.44 mg/dL — ABNORMAL HIGH (ref 0.50–1.35)
Glucose, Bld: 96 mg/dL (ref 70–99)

## 2013-08-24 LAB — MRSA PCR SCREENING: MRSA by PCR: NEGATIVE

## 2013-08-24 LAB — CULTURE, BLOOD (ROUTINE X 2)

## 2013-08-24 SURGERY — LAPAROTOMY, EXPLORATORY
Anesthesia: General | Site: Abdomen | Wound class: Dirty or Infected

## 2013-08-24 MED ORDER — 0.9 % SODIUM CHLORIDE (POUR BTL) OPTIME
TOPICAL | Status: DC | PRN
  Administered 2013-08-24 (×13): 1000 mL

## 2013-08-24 MED ORDER — MIDAZOLAM HCL 2 MG/2ML IJ SOLN: 1.0000 mg | INTRAMUSCULAR | Status: DC | PRN

## 2013-08-24 MED ORDER — IMIPENEM-CILASTATIN 250 MG IV SOLR
250.0000 mg | Freq: Two times a day (BID) | INTRAVENOUS | Status: DC
  Administered 2013-08-24 – 2013-08-26 (×5): 250 mg via INTRAVENOUS
  Filled 2013-08-24 (×7): qty 250

## 2013-08-24 MED ORDER — ALBUMIN HUMAN 5 % IV SOLN
INTRAVENOUS | Status: DC | PRN
  Administered 2013-08-24: 16:00:00 via INTRAVENOUS

## 2013-08-24 MED ORDER — HYDROMORPHONE HCL PF 1 MG/ML IJ SOLN
INTRAMUSCULAR | Status: AC
  Administered 2013-08-24: 0.25 mg via INTRAVENOUS
  Filled 2013-08-24: qty 1

## 2013-08-24 MED ORDER — OXYCODONE HCL 5 MG PO TABS: 5.0000 mg | ORAL_TABLET | Freq: Once | ORAL | Status: DC | PRN

## 2013-08-24 MED ORDER — FENTANYL CITRATE 0.05 MG/ML IJ SOLN
INTRAMUSCULAR | Status: DC | PRN
  Administered 2013-08-24 (×3): 50 ug via INTRAVENOUS
  Administered 2013-08-24: 100 ug via INTRAVENOUS
  Administered 2013-08-24: 50 ug via INTRAVENOUS

## 2013-08-24 MED ORDER — CHLORHEXIDINE GLUCONATE 0.12 % MT SOLN
15.0000 mL | Freq: Two times a day (BID) | OROMUCOSAL | Status: DC
  Administered 2013-08-25 – 2013-08-26 (×4): 15 mL via OROMUCOSAL
  Filled 2013-08-24 (×4): qty 15

## 2013-08-24 MED ORDER — MIDAZOLAM HCL 5 MG/5ML IJ SOLN
INTRAMUSCULAR | Status: DC | PRN
  Administered 2013-08-24: 2 mg via INTRAVENOUS

## 2013-08-24 MED ORDER — SODIUM CHLORIDE 0.9 % IV SOLN
INTRAVENOUS | Status: AC
  Administered 2013-08-24: 1000 mL via INTRAVENOUS

## 2013-08-24 MED ORDER — ALBUTEROL SULFATE HFA 108 (90 BASE) MCG/ACT IN AERS
4.0000 | INHALATION_SPRAY | RESPIRATORY_TRACT | Status: DC
  Administered 2013-08-24 – 2013-08-26 (×8): 4 via RESPIRATORY_TRACT
  Filled 2013-08-24: qty 6.7

## 2013-08-24 MED ORDER — SUCCINYLCHOLINE CHLORIDE 20 MG/ML IJ SOLN
INTRAMUSCULAR | Status: DC | PRN
  Administered 2013-08-24: 120 mg via INTRAVENOUS

## 2013-08-24 MED ORDER — DEXTROSE-NACL 5-0.9 % IV SOLN
INTRAVENOUS | Status: DC
  Administered 2013-08-24: 1000 mL via INTRAVENOUS
  Administered 2013-08-26: 02:00:00 via INTRAVENOUS

## 2013-08-24 MED ORDER — OXYCODONE HCL 5 MG/5ML PO SOLN: 5.0000 mg | Freq: Once | ORAL | Status: DC | PRN

## 2013-08-24 MED ORDER — FENTANYL BOLUS VIA INFUSION
25.0000 ug | INTRAVENOUS | Status: DC | PRN
  Filled 2013-08-24: qty 50

## 2013-08-24 MED ORDER — FENTANYL CITRATE 0.05 MG/ML IJ SOLN: 100.0000 ug | INTRAMUSCULAR | Status: DC | PRN

## 2013-08-24 MED ORDER — PHENYLEPHRINE HCL 10 MG/ML IJ SOLN
10.0000 mg | INTRAVENOUS | Status: DC | PRN
  Administered 2013-08-24: 25 ug/min via INTRAVENOUS

## 2013-08-24 MED ORDER — FENTANYL CITRATE 0.05 MG/ML IJ SOLN
50.0000 ug | Freq: Once | INTRAMUSCULAR | Status: AC
  Administered 2013-08-25: 50 ug via INTRAVENOUS
  Filled 2013-08-24: qty 2

## 2013-08-24 MED ORDER — PROMETHAZINE HCL 25 MG/ML IJ SOLN
INTRAMUSCULAR | Status: AC
  Administered 2013-08-24: 6.25 mg via INTRAVENOUS
  Filled 2013-08-24: qty 1

## 2013-08-24 MED ORDER — GLYCOPYRROLATE 0.2 MG/ML IJ SOLN
INTRAMUSCULAR | Status: DC | PRN
  Administered 2013-08-24: .8 mg via INTRAVENOUS

## 2013-08-24 MED ORDER — PANTOPRAZOLE SODIUM 40 MG IV SOLR: 40.0000 mg | Freq: Every day | INTRAVENOUS | Status: DC

## 2013-08-24 MED ORDER — HYDROMORPHONE HCL PF 1 MG/ML IJ SOLN
0.2500 mg | INTRAMUSCULAR | Status: DC | PRN
  Administered 2013-08-24: 0.5 mg via INTRAVENOUS
  Administered 2013-08-24: 0.25 mg via INTRAVENOUS
  Administered 2013-08-24: 0.5 mg via INTRAVENOUS
  Administered 2013-08-24: 0.25 mg via INTRAVENOUS
  Administered 2013-08-24: 0.5 mg via INTRAVENOUS

## 2013-08-24 MED ORDER — NEOSTIGMINE METHYLSULFATE 1 MG/ML IJ SOLN
INTRAMUSCULAR | Status: DC | PRN
  Administered 2013-08-24: 4 mg via INTRAVENOUS

## 2013-08-24 MED ORDER — BIOTENE DRY MOUTH MT LIQD
15.0000 mL | Freq: Four times a day (QID) | OROMUCOSAL | Status: DC
  Administered 2013-08-25 – 2013-09-04 (×34): 15 mL via OROMUCOSAL

## 2013-08-24 MED ORDER — ONDANSETRON HCL 4 MG/2ML IJ SOLN
INTRAMUSCULAR | Status: DC | PRN
  Administered 2013-08-24: 4 mg via INTRAVENOUS

## 2013-08-24 MED ORDER — FLUCONAZOLE IN SODIUM CHLORIDE 200-0.9 MG/100ML-% IV SOLN
200.0000 mg | INTRAVENOUS | Status: DC
Start: 2013-08-24 — End: 2013-09-02
  Administered 2013-08-25 – 2013-09-01 (×9): 200 mg via INTRAVENOUS
  Filled 2013-08-24 (×11): qty 100

## 2013-08-24 MED ORDER — PHENYLEPHRINE HCL 10 MG/ML IJ SOLN
INTRAMUSCULAR | Status: DC | PRN
  Administered 2013-08-24: 80 ug via INTRAVENOUS

## 2013-08-24 MED ORDER — PROMETHAZINE HCL 25 MG/ML IJ SOLN
6.2500 mg | INTRAMUSCULAR | Status: DC | PRN
  Administered 2013-08-24: 6.25 mg via INTRAVENOUS

## 2013-08-24 MED ORDER — ALBUTEROL SULFATE HFA 108 (90 BASE) MCG/ACT IN AERS
1.0000 | INHALATION_SPRAY | RESPIRATORY_TRACT | Status: DC | PRN
Start: 2013-08-24 — End: 2013-08-26
  Filled 2013-08-24: qty 6.7

## 2013-08-24 MED ORDER — SODIUM CHLORIDE 0.9 % IV SOLN
0.0000 ug/h | INTRAVENOUS | Status: DC
  Filled 2013-08-24: qty 50

## 2013-08-24 MED ORDER — NALOXONE HCL 0.4 MG/ML IJ SOLN
INTRAMUSCULAR | Status: DC | PRN
  Administered 2013-08-24 (×2): .1 ug via INTRAVENOUS

## 2013-08-24 MED ORDER — ROCURONIUM BROMIDE 100 MG/10ML IV SOLN
INTRAVENOUS | Status: DC | PRN
  Administered 2013-08-24: 30 mg via INTRAVENOUS

## 2013-08-24 MED ORDER — SODIUM CHLORIDE 0.9 % IV BOLUS (SEPSIS)
750.0000 mL | Freq: Once | INTRAVENOUS | Status: AC
  Administered 2013-08-24: via INTRAVENOUS

## 2013-08-24 MED ORDER — VANCOMYCIN HCL 10 G IV SOLR: 1500.0000 mg | INTRAVENOUS | Status: DC

## 2013-08-24 MED ORDER — SODIUM CHLORIDE 0.9 % IV BOLUS (SEPSIS)
1000.0000 mL | Freq: Once | INTRAVENOUS | Status: AC
  Administered 2013-08-24: 1000 mL via INTRAVENOUS

## 2013-08-24 MED ORDER — SODIUM CHLORIDE 0.9 % IV SOLN
50.0000 ug/h | INTRAVENOUS | Status: DC
  Filled 2013-08-24: qty 50

## 2013-08-24 MED ORDER — VANCOMYCIN HCL 10 G IV SOLR
2250.0000 mg | Freq: Once | INTRAVENOUS | Status: DC
  Filled 2013-08-24: qty 2250

## 2013-08-24 MED ORDER — PROPOFOL 10 MG/ML IV BOLUS
INTRAVENOUS | Status: DC | PRN
  Administered 2013-08-24: 150 mg via INTRAVENOUS

## 2013-08-24 MED ORDER — FENTANYL CITRATE 0.05 MG/ML IJ SOLN: 50.0000 ug | Freq: Once | INTRAMUSCULAR | Status: DC

## 2013-08-24 MED ORDER — LIDOCAINE HCL (CARDIAC) 20 MG/ML IV SOLN
INTRAVENOUS | Status: DC | PRN
  Administered 2013-08-24: 75 mg via INTRAVENOUS

## 2013-08-24 MED ORDER — SODIUM CHLORIDE 0.9 % IV SOLN
INTRAVENOUS | Status: DC | PRN
  Administered 2013-08-24 (×2): via INTRAVENOUS

## 2013-08-24 SURGICAL SUPPLY — 45 items
BANDAGE GAUZE ELAST BULKY 4 IN (GAUZE/BANDAGES/DRESSINGS) ×3 IMPLANT
BLADE SURG ROTATE 9660 (MISCELLANEOUS) ×3 IMPLANT
CANISTER SUCTION 2500CC (MISCELLANEOUS) ×9 IMPLANT
CHLORAPREP W/TINT 26ML (MISCELLANEOUS) ×3 IMPLANT
COVER MAYO STAND STRL (DRAPES) ×3 IMPLANT
COVER SURGICAL LIGHT HANDLE (MISCELLANEOUS) ×3 IMPLANT
DRAIN CHANNEL 19F RND (DRAIN) ×6 IMPLANT
DRAPE LAPAROSCOPIC ABDOMINAL (DRAPES) ×3 IMPLANT
DRAPE PROXIMA HALF (DRAPES) IMPLANT
DRAPE UTILITY 15X26 W/TAPE STR (DRAPE) ×9 IMPLANT
DRAPE WARM FLUID 44X44 (DRAPE) ×3 IMPLANT
DRSG OPSITE POSTOP 4X10 (GAUZE/BANDAGES/DRESSINGS) IMPLANT
DRSG OPSITE POSTOP 4X8 (GAUZE/BANDAGES/DRESSINGS) IMPLANT
DRSG PAD ABDOMINAL 8X10 ST (GAUZE/BANDAGES/DRESSINGS) ×6 IMPLANT
ELECT BLADE 6.5 EXT (BLADE) ×3 IMPLANT
ELECT CAUTERY BLADE 6.4 (BLADE) ×3 IMPLANT
ELECT REM PT RETURN 9FT ADLT (ELECTROSURGICAL) ×3
ELECTRODE REM PT RTRN 9FT ADLT (ELECTROSURGICAL) ×2 IMPLANT
EVACUATOR SILICONE 100CC (DRAIN) ×6 IMPLANT
GLOVE BIO SURGEON STRL SZ7.5 (GLOVE) ×3 IMPLANT
GOWN STRL NON-REIN LRG LVL3 (GOWN DISPOSABLE) ×9 IMPLANT
KIT BASIN OR (CUSTOM PROCEDURE TRAY) ×3 IMPLANT
KIT ROOM TURNOVER OR (KITS) ×3 IMPLANT
LIGASURE IMPACT 36 18CM CVD LR (INSTRUMENTS) ×3 IMPLANT
NS IRRIG 1000ML POUR BTL (IV SOLUTION) ×39 IMPLANT
PACK GENERAL/GYN (CUSTOM PROCEDURE TRAY) ×3 IMPLANT
PAD ARMBOARD 7.5X6 YLW CONV (MISCELLANEOUS) ×6 IMPLANT
PENCIL BUTTON HOLSTER BLD 10FT (ELECTRODE) IMPLANT
SPECIMEN JAR LARGE (MISCELLANEOUS) IMPLANT
SPONGE GAUZE 4X4 12PLY (GAUZE/BANDAGES/DRESSINGS) ×3 IMPLANT
SPONGE LAP 18X18 X RAY DECT (DISPOSABLE) ×6 IMPLANT
STAPLER VISISTAT 35W (STAPLE) ×3 IMPLANT
SUCTION POOLE TIP (SUCTIONS) ×3 IMPLANT
SUT ETHILON 2 0 FS 18 (SUTURE) ×6 IMPLANT
SUT PDS AB 1 TP1 96 (SUTURE) ×6 IMPLANT
SUT SILK 2 0 SH CR/8 (SUTURE) ×6 IMPLANT
SUT SILK 2 0 TIES 10X30 (SUTURE) ×3 IMPLANT
SUT SILK 3 0 SH CR/8 (SUTURE) ×3 IMPLANT
SUT SILK 3 0 TIES 10X30 (SUTURE) ×3 IMPLANT
SUT VIC AB 3-0 SH 18 (SUTURE) IMPLANT
TAPE CLOTH SURG 6X10 WHT LF (GAUZE/BANDAGES/DRESSINGS) ×3 IMPLANT
TOWEL OR 17X26 10 PK STRL BLUE (TOWEL DISPOSABLE) ×3 IMPLANT
TRAY FOLEY CATH 16FRSI W/METER (SET/KITS/TRAYS/PACK) ×3 IMPLANT
TUBE CONNECTING 12X1/4 (SUCTIONS) IMPLANT
YANKAUER SUCT BULB TIP NO VENT (SUCTIONS) ×3 IMPLANT

## 2013-08-24 NOTE — Progress Notes (Signed)
ANTIBIOTIC CONSULT NOTE - INITIAL  Pharmacy Consult for Fluconazole  Indication: Intra-abdominal infection, bowel perf  No Known Allergies  Patient Measurements: Height: 5\' 11"  (180.3 cm) Weight: 239 lb (108.41 kg) IBW/kg (Calculated) : 75.3  Vital Signs: Temp: 97.8 F (36.6 C) (11/29 1950) Temp src: Axillary (11/29 2030) BP: 97/46 mmHg (11/29 2300) Pulse Rate: 104 (11/29 2300) Intake/Output from previous day: 11/28 0701 - 11/29 0700 In: 2240 [I.V.:1840; IV Piggyback:400] Out: 2500 [Urine:500; Emesis/NG output:2000] Intake/Output from this shift:    Labs:  Recent Labs  08/22/13 0558 08/23/13 0930 08/23/13 1818 08/24/13 0701  WBC 15.5* 40.8* 44.0* 40.5*  HGB 10.0* 12.2* 11.8* 11.4*  PLT 244 309 328 331  CREATININE 2.35* 2.80*  --  4.44*   Estimated Creatinine Clearance: 20.2 ml/min (by C-G formula based on Cr of 4.44). No results found for this basename: VANCOTROUGH, Leodis Binet, VANCORANDOM, GENTTROUGH, GENTPEAK, GENTRANDOM, TOBRATROUGH, TOBRAPEAK, TOBRARND, AMIKACINPEAK, AMIKACINTROU, AMIKACIN,  in the last 72 hours   Microbiology: Recent Results (from the past 720 hour(s))  MRSA PCR SCREENING     Status: None   Collection Time    08/18/13  4:18 PM      Result Value Range Status   MRSA by PCR NEGATIVE  NEGATIVE Final   Comment:            The GeneXpert MRSA Assay (FDA     approved for NASAL specimens     only), is one component of a     comprehensive MRSA colonization     surveillance program. It is not     intended to diagnose MRSA     infection nor to guide or     monitor treatment for     MRSA infections.  URINE CULTURE     Status: None   Collection Time    08/18/13  5:13 PM      Result Value Range Status   Specimen Description URINE, CLEAN CATCH   Final   Special Requests NONE   Final   Culture  Setup Time     Final   Value: 08/19/2013 00:11     Performed at [facility] Count     Final   Value: 70,000 COLONIES/ML     Performed  at [facility]   Culture     Final   Value: ESCHERICHIA COLI     Note: Confirmed Extended Spectrum Beta-Lactamase Producer (ESBL) CEFOXITIN SENSITIVE <=4     Performed at [facility]   Report Status 08/21/2013 FINAL   Final   Organism ID, Bacteria ESCHERICHIA COLI   Final  CULTURE, ROUTINE-ABSCESS     Status: None   Collection Time    08/18/13  6:49 PM      Result Value Range Status   Specimen Description ABSCESS LEFT KIDNEY   Final   Special Requests NONE   Final   Gram Stain     Final   Value: ABUNDANT WBC PRESENT,BOTH PMN AND MONONUCLEAR     NO SQUAMOUS EPITHELIAL CELLS SEEN     MODERATE GRAM NEGATIVE RODS     Performed at [facility]   Culture     Final   Value: ABUNDANT ESCHERICHIA COLI     Note: Confirmed Extended Spectrum Beta-Lactamase Producer (ESBL) CRITICAL RESULT CALLED TO, READ BACK BY AND VERIFIED WITH: ALBERT R 08/21/13 AT 810 BY [facility]     Performed at [facility]   Report Status 08/21/2013 FINAL   Final  Organism ID, Bacteria ESCHERICHIA COLI   Final  ANAEROBIC CULTURE     Status: None   Collection Time    08/18/13  6:54 PM      Result Value Range Status   Specimen Description ABSCESS LEFT KIDNEY   Final   Special Requests NONE   Final   Gram Stain     Final   Value: ABUNDANT WBC PRESENT,BOTH PMN AND MONONUCLEAR     NO SQUAMOUS EPITHELIAL CELLS SEEN     MODERATE GRAM NEGATIVE RODS     Performed at [facility]   Culture     Final   Value: NO ANAEROBES ISOLATED     Performed at [facility]   Report Status 08/23/2013 FINAL   Final  CULTURE, BLOOD (ROUTINE X 2)     Status: None   Collection Time    08/18/13  7:05 PM      Result Value Range Status   Specimen Description BLOOD RIGHT HAND   Final   Special Requests BOTTLES DRAWN AEROBIC AND ANAEROBIC 10CC   Final   Culture  Setup Time     Final   Value: 08/19/2013 00:10     Performed at [facility]   Culture     Final   Value: NO GROWTH 5 DAYS      Performed at [facility]   Report Status 08/24/2013 FINAL   Final  CULTURE, BLOOD (ROUTINE X 2)     Status: None   Collection Time    08/18/13  9:45 PM      Result Value Range Status   Specimen Description BLOOD LEFT HAND   Final   Special Requests BOTTLES DRAWN AEROBIC AND ANAEROBIC 10CC EA   Final   Culture  Setup Time     Final   Value: 08/19/2013 02:54     Performed at [facility]   Culture     Final   Value:        BLOOD CULTURE RECEIVED NO GROWTH TO DATE CULTURE WILL BE HELD FOR 5 DAYS BEFORE ISSUING A FINAL NEGATIVE REPORT     Performed at [facility]   Report Status PENDING   Incomplete  CULTURE, BLOOD (ROUTINE X 2)     Status: None   Collection Time    08/20/13  4:40 PM      Result Value Range Status   Specimen Description BLOOD LEFT ARM   Final   Special Requests BOTTLES DRAWN AEROBIC ONLY 3CC   Final   Culture  Setup Time     Final   Value: 08/21/2013 02:13     Performed at [facility]   Culture     Final   Value:        BLOOD CULTURE RECEIVED NO GROWTH TO DATE CULTURE WILL BE HELD FOR 5 DAYS BEFORE ISSUING A FINAL NEGATIVE REPORT     Performed at [facility]   Report Status PENDING   Incomplete  CULTURE, BLOOD (ROUTINE X 2)     Status: None   Collection Time    08/20/13  4:55 PM      Result Value Range Status   Specimen Description BLOOD RIGHT ARM   Final   Special Requests BOTTLES DRAWN AEROBIC ONLY 10CC   Final   Culture  Setup Time     Final   Value: 08/21/2013 02:13     Performed at [facility]  Final   Value:        BLOOD CULTURE RECEIVED NO GROWTH TO DATE CULTURE WILL BE HELD FOR 5 DAYS BEFORE ISSUING A FINAL NEGATIVE REPORT     Performed at [facility]   Report Status PENDING   Incomplete  MRSA PCR SCREENING     Status: None   Collection Time    08/24/13  8:07 PM      Result Value Range Status   MRSA by PCR NEGATIVE  NEGATIVE Final   Comment:            The GeneXpert MRSA  Assay (FDA     approved for NASAL specimens     only), is one component of a     comprehensive MRSA colonization     surveillance program. It is not     intended to diagnose MRSA     infection nor to guide or     monitor treatment for     MRSA infections.    Medical History: Past Medical History  Diagnosis Date  . Arthritis   . Degenerative disc disease   . Degenerative joint disease   . Aortic aneurysm   . Pneumonia   . Hypercholesterolemia   . Hypertension   . CKD (chronic kidney disease), stage III    Assessment: 67 y/o M already on broad spectrum antibiotics to add fluconazole given results of CT today showing moderate free air. Labs as above.   Goal of Therapy:  Clinical resolution   Plan:  -Start fluconazole 200 mg IV q24h -Trend WBC, temp, renal function  -Adjust dose PRN based on renal function   Thank you for allowing me to take part in this patient's care,  Abran Duke, PharmD Clinical Pharmacist Phone: [phone] Pager: [phone] 08/24/2013 11:32 PM

## 2013-08-24 NOTE — Progress Notes (Signed)
[facility]  Day # 4 imipenem  Subjective: Afebrile. NG decompressing ileus.   But WBC peaked to 44. Now down to  40 over 24hr. Started protonix due to concern for UGI   Antibiotics:  Anti-infectives   Start     Dose/Rate Route Frequency Ordered Stop   08/20/13 1200  imipenem-cilastatin (PRIMAXIN) 250 mg in sodium chloride 0.9 % 100 mL IVPB     250 mg 200 mL/hr over 30 Minutes Intravenous 4 times per day 08/20/13 1132     08/18/13 2200  ceFEPIme (MAXIPIME) 1 g in dextrose 5 % 50 mL IVPB  Status:  Discontinued     1 g 100 mL/hr over 30 Minutes Intravenous Every 12 hours 08/18/13 1846 08/18/13 1847   08/18/13 2000  ceFEPIme (MAXIPIME) 1 g in dextrose 5 % 50 mL IVPB  Status:  Discontinued     1 g 100 mL/hr over 30 Minutes Intravenous Every 24 hours 08/18/13 1728 08/18/13 1846   08/18/13 1900  ceFEPIme (MAXIPIME) 1 g in dextrose 5 % 50 mL IVPB  Status:  Discontinued     1 g 100 mL/hr over 30 Minutes Intravenous Every 24 hours 08/18/13 1847 08/20/13 1132      Medications: Scheduled Meds: . imipenem-cilastatin  250 mg Intravenous Q6H  . pantoprazole (PROTONIX) IV  40 mg Intravenous Q12H  . polyethylene glycol  17 g Oral Daily  . sodium chloride  10 mL Intravenous Q12H    Objective: Weight change:   Intake/Output Summary (Last 24 hours) at 08/24/13 1113 Last data filed at 08/24/13 4540  Gross per 24 hour  Intake   2240 ml  Output   2525 ml  Net   -285 ml   Blood pressure 105/69, pulse 109, temperature 98.5 F (36.9 C), temperature source Oral, resp. rate 18, height 5\' 11"  (1.803 m), weight 244 lb 6.4 oz (110.859 kg), SpO2 89.00%. Temp:  [97.8 F (36.6 C)-98.6 F (37 C)] 98.5 F (36.9 C) (11/29 0936) Pulse Rate:  [109-118] 109 (11/29 0936) Resp:  [18-20] 18 (11/29 0936) BP: (100-132)/(60-78) 105/69 mmHg (11/29 0936) SpO2:  [89 %-98 %] 89 % (11/29 0936)  Physical Exam: General: Alert and awake, oriented x3, not in any acute distress.    HEENT: anicteric sclera, pupils reactive to light and accommodation, EOMI, oropharynx clear and without exudate  CVS regular rate, normal r, no murmur rubs or gallops  Chest: clear to auscultation bilaterally, no wheezing, rales or rhonchi  Abdomen: soft Diffusely tendernormal bowel sounds, neprhostomy tube with amber fluid  Extremities: no clubbing or edema noted bilaterally  Skin: no rashes  Neuro: nonfocal, strength and sensation intact   Lab Results:  Recent Labs  08/23/13 1818 08/24/13 0701  WBC 44.0* 40.5*  HGB 11.8* 11.4*  HCT 34.1* 32.9*  PLT 328 331    BMET  Recent Labs  08/23/13 0930 08/24/13 0701  NA 147* 151*  K 4.8 4.9  CL 119* 120*  CO2 14* 17*  GLUCOSE 122* 96  BUN 73* 98*  CREATININE 2.80* 4.44*  CALCIUM 8.6 8.5    Micro Results: 11/25 blood cx x 2: NGTD 11/23 blood cx NGTD 11/23 renal abscess fluid ; esbl ecoli 11/23 urine cx: 70,000cfu: esbl ecoli  Studies/Results: Dg Abd 2 Views  09/17/13   CLINICAL DATA:  Generalized abdominal pain  EXAM: ABDOMEN - 2 VIEW  COMPARISON:  08/18/2013  FINDINGS: There is nonobstructive bowel gas pattern. Aortic and bilateral iliac artery graft stent is  noted. Left nephrostomy catheter. No free abdominal air. Mild gaseous distended small bowel loops mid abdomen probable mild ileus.  IMPRESSION: Mild gaseous distended small bowel loops mid abdomen probable mild ileus. No air-fluid levels. Left nephrostomy catheter. No free abdominal air.   Electronically Signed   By: Nathan Rivera M.D.   On: 08/22/2013 12:18      Assessment/Plan: Nathan Rivera is a 67 y.o. 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 ie 4-6 weeks WITH  repeat CT to assure it has resolved  2) aki = marked worsening Cr today in comparison to yesterday. Appears still pre-renal .would bolus IVF to see if improvement  3) marked leukocytosis = appears related to ileus, but not significantly improved since decompression. concern that he it is a prelude to other infection, such as c.difficile vs. Reactive to his ileus.  If he has fever, please repeat blood cultures. He is on carbapenem that has good anaerobic coverage. Addendum = free air found on imaging concerning for perforation. Addition of vanco discussed with dr. Benjamine Mola   LOS: 6 days   Nathan Rivera 08/24/2013, 11:13 AM

## 2013-08-24 NOTE — Consult Note (Signed)
Nathan Rivera [birth year]  [id number].    Requesting MD: Dr. Benjamine Mola Chief Complaint/Reason for Consult: Perforated viscus  HPI:  67 y/o male with PMH HTN, anemia, h/o bladder CA, HLD, tobacco use, nephrolithiasis who was admitted on 08/17/13 to [facility] hospital with hematuria and UTI.  Treated with Cipro and Augmentin but did not improve as an outpatient.  He was found to have septic shock secondary to severe left hydronephrosis secondary to a large calculus in the middle of the left ureter.  Urology at [facility] transferred the patient to [facility] for perc nephrostomy tube placement.  He was found to have frank purulent drainage.  He has complained of severe lower abdominal pain since admission to the hospital.  He also c/o nausea, chills, abdominal distension, vomiting.  The pain started to increase in severity over the last few days.  No radiation of pain.  No alleviating factors, and, any movement or touching his abdomen makes the pain worse.  He was thought to have an ileus s/p perc nephrostomy tube placement, but due to unresolved pain and NG was placed.  This am a CT was obtained which showed interval development of moderate free intraperitoneal gas suggestive of a perforated viscus with unknown etiology.  He notes drinking some alcohol, but not excessively.  He smokes 1ppd, and has been taking some NSAIDs recently for aches/pains.  Normal BM's over the last few weeks and denies any constipation/diarrhea/melena.  Notes bloody vomitus in the hospital.  Prior abdominal surgeries include AAA repair, Right groin hernia repair, umbilical hernia repair.  C/o recurrent hernia in the umbilicus.     ROS: All systems reviewed and otherwise negative except for as above  Family History  Problem Relation Age of Onset  . CVA Father   . Atrial fibrillation Sister     Past Medical History  Diagnosis Date  . Arthritis   . Degenerative disc disease   . Degenerative joint disease   . Aortic aneurysm   . Pneumonia    . Hypercholesterolemia   . Hypertension   . CKD (chronic kidney disease), stage III     Past Surgical History  Procedure Laterality Date  . Abdominal aortic aneurysm repair  2011  . Hernia repair    . Tonsillectomy    . Umbilical hernia repair      50 yr ago    Social History:  reports that he has been smoking Cigarettes.  He has a 40 pack-year smoking history. He does not have any smokeless tobacco history on file. He reports that he drinks about 1.8 ounces of alcohol per week. He reports that he does not use illicit drugs.  Allergies: No Known Allergies  Medications Prior to Admission  Medication Sig Dispense Refill  . amLODipine (NORVASC) 5 MG tablet Take 5 mg by mouth daily.       . cholecalciferol (VITAMIN D) 1000 UNITS tablet Take 1,000 Units by mouth daily.      Marland Kitchen etodolac (LODINE) 500 MG tablet Take 500 mg by mouth 2 (two) times daily.      Marland Kitchen losartan (COZAAR) 100 MG tablet Take 100 mg by mouth daily.       . simvastatin (ZOCOR) 20 MG tablet Take 20 mg by mouth at bedtime.          Blood pressure 105/69, pulse 109, temperature 98.5 F (36.9 C), temperature source Oral, resp. rate 18, height 5\' 11"  (1.803 m), weight 244 lb 6.4 oz (110.859 kg), SpO2 89.00%. Physical Exam: General: pleasant, WD/WN  white male who is laying in bed in NAD HEENT: head is normocephalic, atraumatic.  Sclera are noninjected.  PERRL.  Ears and nose without any masses or lesions.  Mouth is pink and moist Heart: regular, rate, and rhythm.  No obvious murmurs, gallops, or rubs noted.  Palpable pedal pulses bilaterally Lungs: CTAB, no wheezes, rhonchi, or rales noted.  Respiratory effort nonlabored Abd: Obese, distended, tympanic, firm, peritoneal signs, +guarding, +BS, umbilical hernia noted, lower abdominal scars noted, no midline abdominal scars MS: all 4 extremities are symmetrical with no cyanosis, clubbing, or edema. Skin: warm and dry with no masses, lesions, or rashes Psych: A&Ox3 with an  appropriate affect.   Results for orders placed during the hospital encounter of 08/18/13 (from the past 48 hour(s))  BASIC METABOLIC PANEL     Status: Abnormal   Collection Time    08/23/13  9:30 AM      Result Value Range   Sodium 147 (*) 135 - 145 mEq/L   Potassium 4.8  3.5 - 5.1 mEq/L   Chloride 119 (*) 96 - 112 mEq/L   CO2 14 (*) 19 - 32 mEq/L   Glucose, Bld 122 (*) 70 - 99 mg/dL   BUN 73 (*) 6 - 23 mg/dL   Creatinine, Ser 0.86 (*) 0.50 - 1.35 mg/dL   Calcium 8.6  8.4 - 57.8 mg/dL   GFR calc non Af Amer 22 (*) >90 mL/min   GFR calc Af Amer 25 (*) >90 mL/min   Comment: (NOTE)     The eGFR has been calculated using the CKD EPI equation.     This calculation has not been validated in all clinical situations.     eGFR's persistently <90 mL/min signify possible Chronic Kidney     Disease.  CBC     Status: Abnormal   Collection Time    08/23/13  9:30 AM      Result Value Range   WBC 40.8 (*) 4.0 - 10.5 K/uL   RBC 4.18 (*) 4.22 - 5.81 MIL/uL   Hemoglobin 12.2 (*) 13.0 - 17.0 g/dL   Comment: RESULTS VERIFIED VIA RECOLLECT   HCT 36.0 (*) 39.0 - 52.0 %   MCV 86.1  78.0 - 100.0 fL   MCH 29.2  26.0 - 34.0 pg   MCHC 33.9  30.0 - 36.0 g/dL   RDW 46.9 (*) 62.9 - 52.8 %   Platelets 309  150 - 400 K/uL  PH, GASTRIC FLUID (GASTROCCULT CARD)     Status: None   Collection Time    08/23/13 11:59 AM      Result Value Range   pH, Gastric POSITIVE    CBC     Status: Abnormal   Collection Time    08/23/13  6:18 PM      Result Value Range   WBC 44.0 (*) 4.0 - 10.5 K/uL   RBC 3.98 (*) 4.22 - 5.81 MIL/uL   Hemoglobin 11.8 (*) 13.0 - 17.0 g/dL   HCT 41.3 (*) 24.4 - 01.0 %   MCV 85.7  78.0 - 100.0 fL   MCH 29.6  26.0 - 34.0 pg   MCHC 34.6  30.0 - 36.0 g/dL   RDW 27.2 (*) 53.6 - 64.4 %   Platelets 328  150 - 400 K/uL  CBC     Status: Abnormal   Collection Time    08/24/13  7:01 AM      Result Value Range   WBC 40.5 (*) 4.0 - 10.5 K/uL  Comment: CONSISTENT WITH PREVIOUS RESULT    RBC 3.86 (*) 4.22 - 5.81 MIL/uL   Hemoglobin 11.4 (*) 13.0 - 17.0 g/dL   HCT 45.4 (*) 09.8 - 11.9 %   MCV 85.2  78.0 - 100.0 fL   MCH 29.5  26.0 - 34.0 pg   MCHC 34.7  30.0 - 36.0 g/dL   RDW 14.7 (*) 82.9 - 56.2 %   Platelets 331  150 - 400 K/uL  BASIC METABOLIC PANEL     Status: Abnormal   Collection Time    08/24/13  7:01 AM      Result Value Range   Sodium 151 (*) 135 - 145 mEq/L   Potassium 4.9  3.5 - 5.1 mEq/L   Chloride 120 (*) 96 - 112 mEq/L   CO2 17 (*) 19 - 32 mEq/L   Glucose, Bld 96  70 - 99 mg/dL   BUN 98 (*) 6 - 23 mg/dL   Creatinine, Ser 1.30 (*) 0.50 - 1.35 mg/dL   Comment: DELTA CHECK NOTED   Calcium 8.5  8.4 - 10.5 mg/dL   GFR calc non Af Amer 13 (*) >90 mL/min   GFR calc Af Amer 14 (*) >90 mL/min   Comment: (NOTE)     The eGFR has been calculated using the CKD EPI equation.     This calculation has not been validated in all clinical situations.     eGFR's persistently <90 mL/min signify possible Chronic Kidney     Disease.  DIFFERENTIAL     Status: Abnormal   Collection Time    08/24/13  7:01 AM      Result Value Range   Neutrophils Relative % 91 (*) 43 - 77 %   Lymphocytes Relative 7 (*) 12 - 46 %   Monocytes Relative 2 (*) 3 - 12 %   Eosinophils Relative 0  0 - 5 %   Basophils Relative 0  0 - 1 %   Neutro Abs 36.9 (*) 1.7 - 7.7 K/uL   Lymphs Abs 2.8  0.7 - 4.0 K/uL   Monocytes Absolute 0.8  0.1 - 1.0 K/uL   Eosinophils Absolute 0.0  0.0 - 0.7 K/uL   Basophils Absolute 0.0  0.0 - 0.1 K/uL   RBC Morphology POLYCHROMASIA PRESENT     WBC Morphology TOXIC GRANULATION     Ct Abdomen Pelvis Wo Contrast  08/24/2013   CLINICAL DATA:  Abdominal pain, sepsis, leukocytosis. "Coffee-ground" fluid draining from NG tube.  EXAM: CT ABDOMEN AND PELVIS WITHOUT CONTRAST  TECHNIQUE: Multidetector CT imaging of the abdomen and pelvis was performed following the standard protocol without intravenous contrast.  COMPARISON:  [facility] CT abdomen pelvis dated 08/18/2013  FINDINGS:  Motion degraded images.  Small right pleural effusion. Associated lower lobe atelectasis bilaterally.  Unenhanced liver, pancreas, and adrenal glands are within normal limits.  Numerous splenic granulomata.  Interval placement of a left percutaneous nephrostomy catheter with decompression of the prior left hydronephrosis. Bilateral renal cysts, poorly evaluated in the absence of intravenous contrast. Stable 10 mm proximal left ureteral calculus (series 2/ image 64).  Atherosclerotic calcifications of the abdominal aorta and branch vessels. Aorto bi-iliac stent graft.  Moderate abdominopelvic ascites, new.  Interval development of moderate free intraperitoneal gas. This would not be accounted for by the nephrostomy placement, which was retroperitoneal. In the absence of interval intraperitoneal procedure, this is worrisome for a perforated hollow viscus. Given the presence of inflammatory stranding (series 2/image 36) and localized gas near the gastric  antrum (series 2/image 32), a perforated gastric ulcer is considered.  No suspicious abdominopelvic lymphadenopathy.  Prostate is unremarkable.  Mildly thick-walled bladder (Series 2/ image 99).  Mild degenerative changes of the visualized thoracolumbar spine.  IMPRESSION: Interval development of moderate free intraperitoneal gas, suggesting a perforated hollow viscus. Although the exact etiology is unclear, a perforated gastric ulcer is considered.  Moderate abdominopelvic ascites, new.  Interval placement of a left percutaneous nephrostomy catheter with decompression of prior left hydronephrosis. Stable 10 mm proximal left ureteral calculus.  Mildly thick-walled bladder, correlate for cystitis.  Critical value/emergent results were called by telephone at the time of interpretation on 08/24/2013 at 12:40 PM to Dr.JESSICA [facility] , who verbally acknowledged these results.   Electronically Signed   By: Charline Bills M.D.   On: 08/24/2013 12:44   Dg Chest 1  View  08/24/2013   CLINICAL DATA:  Decreasing oxygen saturation, abdominal pain  EXAM: CHEST - 1 VIEW  COMPARISON:  08/18/2013; 08/17/2013; CT abdomen pelvis -08/24/2013  FINDINGS: Grossly unchanged borderline enlarged cardiac silhouette and mediastinal contours. Interval placement of enteric tube tip and side port projecting below the left hemidiaphragm. Interval removal of left jugular approach central venous catheter. Overall improved aeration of the lungs. There is persistent mild elevation of the right hemidiaphragm and minimal bibasilar opacities, right greater than left. No new focal airspace opacities. Trace right-sided effusion is not excluded. No pneumothorax. Unchanged bones.  IMPRESSION: 1. Overall improved aeration of the lungs suggests resolving edema and/or atelectasis. 2. Persistent mild elevation of the right hemidiaphragm with bibasilar opacities, right greater than left, likely atelectasis. No new focal airspace opacities. 3. Enteric tube tip and side port projects below the left hemidiaphragm.   Electronically Signed   By: Simonne Come M.D.   On: 08/24/2013 12:50       Assessment/Plan Perforated viscus with free intraperitoneal gas ?perforated gastric ulcer Hydronephrosis (left)/renal abscess s/p perc nephro tube Bacteremia (ESBL ecoli) ARF  Plan: 1.  Unknown cause of perforated viscus with moderate amount of free air- OR emergently for ex lap, possible repair vs resection of perforation and possible ostomy 2.  NPO, IVF, pain control, antiemetics, antibiotics 3.  Patient, wife, and daughter have agreed to surgery and understands the risks and benefits.   DORT, Kameren Baade 08/24/2013, 1:40 PM Pager: [phone]

## 2013-08-24 NOTE — Consult Note (Signed)
PULMONARY  / CRITICAL CARE MEDICINE  Name: Nathan Rivera MRN: [id number] DOB: 12-08-1945    ADMISSION DATE:  08/18/2013 CONSULTATION DATE: 08/24/2013  REFERRING MD :  SICU PRIMARY SERVICE:  SICU  CHIEF COMPLAINT:  Post Laparotomy   BRIEF PATIENT DESCRIPTION: Post Laparotomy for free air and sepsis , vent support, got called to help manage that   SIGNIFICANT EVENTS / STUDIES:    LINES / TUBES: ETT  CULTURES: ESBL on the urine from the 23 rd   ANTIBIOTICS: Imipenem   HISTORY OF PRESENT ILLNESS:   67 y/o male with PMH HTN, anemia, h/o bladder CA, HLD, tobacco use, nephrolithiasis who was admitted on 08/17/13 to [facility] hospital with hematuria and UTI. Treated with Cipro and Augmentin but did not improve as an outpatient. He was found to have septic shock secondary to severe left hydronephrosis secondary to a large calculus in the middle of the left ureter. Urology at [facility] transferred the patient to [facility] for perc nephrostomy tube placement. He was found to have frank purulent drainage. He has complained of severe lower abdominal pain since admission to the hospital. He also c/o nausea, chills, abdominal distension, vomiting. The pain started to increase in severity over the last few days. No radiation of pain. No alleviating factors, and, any movement or touching his abdomen makes the pain worse. He was thought to have an ileus s/p perc nephrostomy tube placement, but due to unresolved pain and NG was placed. This am a CT was obtained which showed interval development of moderate free intraperitoneal gas suggestive of a perforated viscus with unknown etiology. He notes drinking some alcohol, but not excessively. He smokes 1ppd, and has been taking some NSAIDs recently for aches/pains. Normal BM's over the last few weeks and denies any constipation/diarrhea/melena. Notes bloody vomitus in the hospital. Prior abdominal surgeries include AAA repair, Right groin hernia repair, umbilical hernia  repair. C/o recurrent hernia in the umbilicus.    PAST MEDICAL HISTORY :  Past Medical History  Diagnosis Date  . Arthritis   . Degenerative disc disease   . Degenerative joint disease   . Aortic aneurysm   . Pneumonia   . Hypercholesterolemia   . Hypertension   . CKD (chronic kidney disease), stage III    Past Surgical History  Procedure Laterality Date  . Abdominal aortic aneurysm repair  2011  . Hernia repair      33mo old  . Tonsillectomy    . Umbilical hernia repair      50 yr ago   Prior to Admission medications   Medication Sig Start Date End Date Taking? Authorizing Provider  amLODipine (NORVASC) 5 MG tablet Take 5 mg by mouth daily.  06/28/13  Yes Historical Provider, MD  cholecalciferol (VITAMIN D) 1000 UNITS tablet Take 1,000 Units by mouth daily.   Yes Historical Provider, MD  etodolac (LODINE) 500 MG tablet Take 500 mg by mouth 2 (two) times daily.   Yes Historical Provider, MD  losartan (COZAAR) 100 MG tablet Take 100 mg by mouth daily.  06/28/13  Yes Historical Provider, MD  simvastatin (ZOCOR) 20 MG tablet Take 20 mg by mouth at bedtime.     Yes Historical Provider, MD   No Known Allergies  FAMILY HISTORY:  Family History  Problem Relation Age of Onset  . CVA Father   . Atrial fibrillation Sister    SOCIAL HISTORY:  reports that he has been smoking Cigarettes.  He has a 40 pack-year smoking history. He does not  have any smokeless tobacco history on file. He reports that he drinks about 1.8 ounces of alcohol per week. He reports that he does not use illicit drugs.  REVIEW OF SYSTEMS:    Unable to obtain   SUBJECTIVE:   VITAL SIGNS: Temp:  [97.4 F (36.3 C)-98.6 F (37 C)] 97.8 F (36.6 C) (11/29 1950) Pulse Rate:  [100-121] 103 (11/29 2125) Resp:  [13-22] 13 (11/29 2125) BP: (77-131)/(39-69) 87/42 mmHg (11/29 2125) SpO2:  [87 %-100 %] 100 % (11/29 2214) FiO2 (%):  [60 %-100 %] 60 % (11/29 2214) Weight:  [108.41 kg (239 lb)] 108.41 kg (239 lb)  (11/29 2030)  PHYSICAL EXAMINATION:  General: pleasant, WD/WN white male who is laying in bed in intubated   HEENT: head is normocephalic, atraumatic. Sclera are noninjected. PERRL. Ears and nose without any masses or lesions. Mouth is pink and moist  Heart: regular, rate, and rhythm. No obvious murmurs, gallops, or rubs noted. Palpable pedal pulses bilaterally  Lungs: CTAB, no wheezes, rhonchi, or rales noted. Respiratory effort nonlabored  Abd: Obese, Laparotomy scar with dressing in place  MS: all 4 extremities are symmetrical with no cyanosis, clubbing, or edema.  Skin: warm and dry with no masses, lesions, or rashes  Psych: sedated intubated .   Recent Labs Lab 08/22/13 0558 08/23/13 0930 08/24/13 0701  NA 149* 147* 151*  K 4.2 4.8 4.9  CL 125* 119* 120*  CO2 14* 14* 17*  BUN 67* 73* 98*  CREATININE 2.35* 2.80* 4.44*  GLUCOSE 102* 122* 96    Recent Labs Lab 08/23/13 0930 08/23/13 1818 08/24/13 0701  HGB 12.2* 11.8* 11.4*  HCT 36.0* 34.1* 32.9*  WBC 40.8* 44.0* 40.5*  PLT 309 328 331   Ct Abdomen Pelvis Wo Contrast  08/24/2013   CLINICAL DATA:  Abdominal pain, sepsis, leukocytosis. "Coffee-ground" fluid draining from NG tube.  EXAM: CT ABDOMEN AND PELVIS WITHOUT CONTRAST  TECHNIQUE: Multidetector CT imaging of the abdomen and pelvis was performed following the standard protocol without intravenous contrast.  COMPARISON:  [facility] CT abdomen pelvis dated 08/18/2013  FINDINGS: Motion degraded images.  Small right pleural effusion. Associated lower lobe atelectasis bilaterally.  Unenhanced liver, pancreas, and adrenal glands are within normal limits.  Numerous splenic granulomata.  Interval placement of a left percutaneous nephrostomy catheter with decompression of the prior left hydronephrosis. Bilateral renal cysts, poorly evaluated in the absence of intravenous contrast. Stable 10 mm proximal left ureteral calculus (series 2/ image 64).  Atherosclerotic calcifications of the  abdominal aorta and branch vessels. Aorto bi-iliac stent graft.  Moderate abdominopelvic ascites, new.  Interval development of moderate free intraperitoneal gas. This would not be accounted for by the nephrostomy placement, which was retroperitoneal. In the absence of interval intraperitoneal procedure, this is worrisome for a perforated hollow viscus. Given the presence of inflammatory stranding (series 2/image 36) and localized gas near the gastric antrum (series 2/image 32), a perforated gastric ulcer is considered.  No suspicious abdominopelvic lymphadenopathy.  Prostate is unremarkable.  Mildly thick-walled bladder (Series 2/ image 99).  Mild degenerative changes of the visualized thoracolumbar spine.  IMPRESSION: Interval development of moderate free intraperitoneal gas, suggesting a perforated hollow viscus. Although the exact etiology is unclear, a perforated gastric ulcer is considered.  Moderate abdominopelvic ascites, new.  Interval placement of a left percutaneous nephrostomy catheter with decompression of prior left hydronephrosis. Stable 10 mm proximal left ureteral calculus.  Mildly thick-walled bladder, correlate for cystitis.  Critical value/emergent results were called by telephone at  the time of interpretation on 08/24/2013 at 12:40 PM to Dr.JESSICA [facility] , who verbally acknowledged these results.   Electronically Signed   By: Charline Bills M.D.   On: 08/24/2013 12:44   Dg Chest 1 View  08/24/2013   CLINICAL DATA:  Decreasing oxygen saturation, abdominal pain  EXAM: CHEST - 1 VIEW  COMPARISON:  08/18/2013; 08/17/2013; CT abdomen pelvis -08/24/2013  FINDINGS: Grossly unchanged borderline enlarged cardiac silhouette and mediastinal contours. Interval placement of enteric tube tip and side port projecting below the left hemidiaphragm. Interval removal of left jugular approach central venous catheter. Overall improved aeration of the lungs. There is persistent mild elevation of the right  hemidiaphragm and minimal bibasilar opacities, right greater than left. No new focal airspace opacities. Trace right-sided effusion is not excluded. No pneumothorax. Unchanged bones.  IMPRESSION: 1. Overall improved aeration of the lungs suggests resolving edema and/or atelectasis. 2. Persistent mild elevation of the right hemidiaphragm with bibasilar opacities, right greater than left, likely atelectasis. No new focal airspace opacities. 3. Enteric tube tip and side port projects below the left hemidiaphragm.   Electronically Signed   By: Simonne Come M.D.   On: 08/24/2013 12:50   Portable Chest Xray  08/24/2013   CLINICAL DATA:  Endotracheal tube placement.  EXAM: PORTABLE CHEST - 1 VIEW  COMPARISON:  08/24/2013 at 1143 hr wound.  FINDINGS: Endotracheal tube is been placed with tip measuring 4.7 cm above the carinal. Enteric tube remains in place without evidence of change. The tip is off the field of view. There is shallow inspiration with linear atelectasis developing in both lung bases. Probable small right pleural effusion. Normal heart size and pulmonary vascularity. No pneumothorax.  IMPRESSION: Endotracheal tube tip measures about 4.7 cm above the carina. Increasing atelectasis in the lung bases. Probable small right pleural effusion.   Electronically Signed   By: Burman Nieves M.D.   On: 08/24/2013 22:21    ASSESSMENT / PLAN:  Patient is 67 yrs old Smoker with Free air , S/P laparotomy , ESBL in the urine complicated by Severe Hydronephrosis requiring nephrostomy tube , now on the Vent , got called to help with the Vent .  - Vent support with ABG showing M ans R Acidosis , Increased rate to 20 on PRVC  - Will need to be on the vent till he is full awake from the anaesthesia and paralytics from the OR , for now will keep on fentanyl Drip for Pain after the OR . Need to be weaned off in the AM for possible extubation  -C/W Abx Imipenem for now , Add Fluconazole for the Perf Bowel . Rest of  management is done by the SICU team    Pulmonary and Critical Care Medicine [facility] Pager: [phone]  08/24/2013, 10:39 PM

## 2013-08-24 NOTE — Progress Notes (Signed)
[facility] Progress Note    Nathan Rivera ZOX:[id number] DOB: 1946/08/15 DOA: 08/18/2013 PCP: [name], MD  Brief narrative: 67 year old male with history of hypertension, anemia, history of bladder cancer , hyperlipidemia, tobacco abuse, nephrolithiasis was originally admitted on 08/17/13 at [facility]. Patient was apparently having issues with hematuria and UTI as an outpatient and was being treated with ciprofloxacin and Augmentin. Patient was lethargic on the day of admission and hypotensive with BP of 74/44. He was admitted to the ICU with septic shock and was found to have gram-negative sepsis ( final blood cultures are still pending). Ultrasound of the abdomen did not show any evidence for obstruction in the gallbladder area but was noted to have 1 cm stone with hydronephrosis. CT of the abdomen done today showed minimal cholelithiasis, severe left hydronephrosis secondary to 15 x 7 MM calculus in the middle portion of the left ureter. Urology was consulted and patient was seen by Dr. Yevonne Aline at [facility] who felt given patient's severe sepsis picture, significant leukocytosis, elevated creatinine and large proximal ureteral stone, he needs a nephrostomy tube. Risk of stent would be inability to pass the stent or it might not fully drain his urinary tract and hence a nephrostomy tube was felt more appropriate. Patient was transferred to [facility] for emergent interventional radiology consultation for nephrostomy tube.   After admission he developed refractory hypotension that required pressors so was transferred to ICU. Prior to this he had undergone successful placement of a percutaneous nephrostomy tube by IR with frank purulence noted.    Assessment/Plan: abd pain- still having even with NG tube -x ray on 11/27 showed mild ileus- still no bowel sounds - surgery consult since not better -CT scan of abd as WBC count up and pain still prominent -added protonix-  as dark output- not appearing to be blood as Hgb stable    Severe sepsis -resolved -source was urinary tract -cont supportive care -ID following- he should receive IV carbapenem therapy for 2 weeks MINIMUM beyond date of 11/ 24 AND until resolution of his renal abscess, which may take quite a bit longer. ie 4-6 weeks WITH repeat CT to assure it has resolved   Hydronephrosis, left/Renal abscess -post perc. nephro tube - dc out with perc nephro tube to follow up with Urology here as family declines to go back to [place]    Bacteremia due to ESBL Escherichia coli -initially treated with Cefepime -fianl cx called 11/25 from [place] with E. Coli positive ESBL -d/w ID- recommend change to Imipenem, d/c CL that was placed 11/23 and obtain 3rd set of blood cx's after CL out     Acute renal failure -baseline Scr unknown but suspect has some CKD- wife states baseline about 2.5 -bladder scan to be sure emptying -likely obstructive uropathy due to ? Renal stone causing hydronephrosis and infection    Acute respiratory failure with hypoxia -due to sepsis, ARF and volume overload from prior resuscitative efforts -now on RA    Dehydration -resolved    PVD (peripheral vascular disease)    Hypertension -resume home meds- one at a time  Hypernatremia -suspect dehydration -increase 1/2 NS  If any vital sign appear unstable, will transfer back to SDU  DVT prophylaxis: SCDs Code Status: Full Family Communication: Patient Isolation: Contact isolation for ESBL positive Escherichia coli bacteremia Nutritional Status: Acute protein calorie malnutrition related to recent sepsis  Consultants: PCCM ID IR  Procedures: - 11/24 Left IJ CVC - d/c 11/25 - xxx  Post left percutaneous nephrostomy.    CULTURES:  11/23 blood culture>>> ESBL positive E. Coli (2/2) from [place] 11/23 aerobic culture>>>  11/23 urine culture>>>  Antibiotics: Cefepime 11/23 >>> 11/25 Primaxin 11/25  >>>  HPI/Subjective: Still with abd pain, denies fever  Objective: Blood pressure 105/69, pulse 109, temperature 98.5 F (36.9 C), temperature source Oral, resp. rate 18, height 5\' 11"  (1.803 m), weight 110.859 kg (244 lb 6.4 oz), SpO2 89.00%.  Intake/Output Summary (Last 24 hours) at 08/24/13 1019 Last data filed at 08/24/13 4540  Gross per 24 hour  Intake   2240 ml  Output   2525 ml  Net   -285 ml     Exam: General: No acute respiratory distress, appear uncomfortable Lungs: Clear to auscultation bilaterally without wheezes or crackles, RA Cardiovascular: Regular rate and rhythm without murmur gallop or rub normal S1 and S2, no peripheral edema or JVD Abdomen:  Tenderness throughout, distended, soft, no bowel sounds Genitourinary; Left percutaneous nephrostomy tube - urine darker Musculoskeletal: No significant cyanosis, clubbing of bilateral lower extremities Neurological: Alert and oriented x 3, moves all extremities x 4 without focal neurological deficits, CN 2-12 intact  Scheduled Meds:  Scheduled Meds: . imipenem-cilastatin  250 mg Intravenous Q6H  . pantoprazole (PROTONIX) IV  40 mg Intravenous Q12H  . polyethylene glycol  17 g Oral Daily  . sodium chloride  10 mL Intravenous Q12H   Continuous Infusions: . sodium chloride 75 mL/hr at 08/22/13 2128      Data Reviewed: Basic Metabolic Panel:  Recent Labs Lab 08/19/13 0500 08/20/13 0500 08/22/13 0558 08/23/13 0930 08/24/13 0701  NA 140 144 149* 147* 151*  K 5.2* 5.2* 4.2 4.8 4.9  CL 114* 119* 125* 119* 120*  CO2 12* 13* 14* 14* 17*  GLUCOSE 105* 88 102* 122* 96  BUN 107* 97* 67* 73* 98*  CREATININE 4.91* 4.08* 2.35* 2.80* 4.44*  CALCIUM 7.8* 8.4 8.0* 8.6 8.5  MG  --  2.2  --   --   --   PHOS  --  5.6*  --   --   --    Liver Function Tests:  Recent Labs Lab 08/19/13 0500  AST 53*  ALT 47  ALKPHOS 312*  BILITOT 1.1  PROT 5.7*  ALBUMIN 1.5*   No results found for this basename: LIPASE,  AMYLASE,  in the last 168 hours No results found for this basename: AMMONIA,  in the last 168 hours CBC:  Recent Labs Lab 08/20/13 0500 08/22/13 0558 08/23/13 0930 08/23/13 1818 08/24/13 0701  WBC 18.6* 15.5* 40.8* 44.0* 40.5*  NEUTROABS  --   --   --   --  36.9*  HGB 10.2* 10.0* 12.2* 11.8* 11.4*  HCT 29.3* 29.0* 36.0* 34.1* 32.9*  MCV 85.2 84.8 86.1 85.7 85.2  PLT 163 244 309 328 331   Cardiac Enzymes:  Recent Labs Lab 08/18/13 2150  TROPONINI <0.30   BNP (last 3 results) No results found for this basename: PROBNP,  in the last 8760 hours CBG:  Recent Labs Lab 08/18/13 2203 08/19/13 0010 08/19/13 0400 08/19/13 0817 08/19/13 1211  GLUCAP 93 116* 105* 99 98    Recent Results (from the past 240 hour(s))  MRSA PCR SCREENING     Status: None   Collection Time    08/18/13  4:18 PM      Result Value Range Status   MRSA by PCR NEGATIVE  NEGATIVE Final   Comment:  The GeneXpert MRSA Assay (FDA     approved for NASAL specimens     only), is one component of a     comprehensive MRSA colonization     surveillance program. It is not     intended to diagnose MRSA     infection nor to guide or     monitor treatment for     MRSA infections.  URINE CULTURE     Status: None   Collection Time    08/18/13  5:13 PM      Result Value Range Status   Specimen Description URINE, CLEAN CATCH   Final   Special Requests NONE   Final   Culture  Setup Time     Final   Value: 08/19/2013 00:11     Performed at [facility] Count     Final   Value: 70,000 COLONIES/ML     Performed at [facility]   Culture     Final   Value: ESCHERICHIA COLI     Note: Confirmed Extended Spectrum Beta-Lactamase Producer (ESBL) CEFOXITIN SENSITIVE <=4     Performed at [facility]   Report Status 08/21/2013 FINAL   Final   Organism ID, Bacteria ESCHERICHIA COLI   Final  CULTURE, ROUTINE-ABSCESS     Status: None   Collection Time    08/18/13  6:49 PM       Result Value Range Status   Specimen Description ABSCESS LEFT KIDNEY   Final   Special Requests NONE   Final   Gram Stain     Final   Value: ABUNDANT WBC PRESENT,BOTH PMN AND MONONUCLEAR     NO SQUAMOUS EPITHELIAL CELLS SEEN     MODERATE GRAM NEGATIVE RODS     Performed at [facility]   Culture     Final   Value: ABUNDANT ESCHERICHIA COLI     Note: Confirmed Extended Spectrum Beta-Lactamase Producer (ESBL) CRITICAL RESULT CALLED TO, READ BACK BY AND VERIFIED WITH: ALBERT R 08/21/13 AT 810 BY [facility]     Performed at [facility]   Report Status 08/21/2013 FINAL   Final   Organism ID, Bacteria ESCHERICHIA COLI   Final  ANAEROBIC CULTURE     Status: None   Collection Time    08/18/13  6:54 PM      Result Value Range Status   Specimen Description ABSCESS LEFT KIDNEY   Final   Special Requests NONE   Final   Gram Stain     Final   Value: ABUNDANT WBC PRESENT,BOTH PMN AND MONONUCLEAR     NO SQUAMOUS EPITHELIAL CELLS SEEN     MODERATE GRAM NEGATIVE RODS     Performed at [facility]   Culture     Final   Value: NO ANAEROBES ISOLATED     Performed at [facility]   Report Status 08/23/2013 FINAL   Final  CULTURE, BLOOD (ROUTINE X 2)     Status: None   Collection Time    08/18/13  7:05 PM      Result Value Range Status   Specimen Description BLOOD RIGHT HAND   Final   Special Requests BOTTLES DRAWN AEROBIC AND ANAEROBIC 10CC   Final   Culture  Setup Time     Final   Value: 08/19/2013 00:10     Performed at [facility]   Culture     Final   Value:  BLOOD CULTURE RECEIVED NO GROWTH TO DATE CULTURE WILL BE HELD FOR 5 DAYS BEFORE ISSUING A FINAL NEGATIVE REPORT     Performed at [facility]   Report Status PENDING   Incomplete  CULTURE, BLOOD (ROUTINE X 2)     Status: None   Collection Time    08/18/13  9:45 PM      Result Value Range Status   Specimen Description BLOOD LEFT HAND   Final   Special Requests BOTTLES DRAWN  AEROBIC AND ANAEROBIC 10CC EA   Final   Culture  Setup Time     Final   Value: 08/19/2013 02:54     Performed at [facility]   Culture     Final   Value:        BLOOD CULTURE RECEIVED NO GROWTH TO DATE CULTURE WILL BE HELD FOR 5 DAYS BEFORE ISSUING A FINAL NEGATIVE REPORT     Performed at [facility]   Report Status PENDING   Incomplete  CULTURE, BLOOD (ROUTINE X 2)     Status: None   Collection Time    08/20/13  4:40 PM      Result Value Range Status   Specimen Description BLOOD LEFT ARM   Final   Special Requests BOTTLES DRAWN AEROBIC ONLY 3CC   Final   Culture  Setup Time     Final   Value: 08/21/2013 02:13     Performed at [facility]   Culture     Final   Value:        BLOOD CULTURE RECEIVED NO GROWTH TO DATE CULTURE WILL BE HELD FOR 5 DAYS BEFORE ISSUING A FINAL NEGATIVE REPORT     Performed at [facility]   Report Status PENDING   Incomplete  CULTURE, BLOOD (ROUTINE X 2)     Status: None   Collection Time    08/20/13  4:55 PM      Result Value Range Status   Specimen Description BLOOD RIGHT ARM   Final   Special Requests BOTTLES DRAWN AEROBIC ONLY 10CC   Final   Culture  Setup Time     Final   Value: 08/21/2013 02:13     Performed at [facility]   Culture     Final   Value:        BLOOD CULTURE RECEIVED NO GROWTH TO DATE CULTURE WILL BE HELD FOR 5 DAYS BEFORE ISSUING A FINAL NEGATIVE REPORT     Performed at [facility]   Report Status PENDING   Incomplete          Marlin Canary [facility] Pager [phone]   If 7PM-7AM, please contact night-coverage [email or web address] Password [email or web address] 08/24/2013, 10:19 AM   LOS: 6 days

## 2013-08-24 NOTE — Progress Notes (Signed)
eLink Physician-Brief Progress Note Patient Name: Nathan Rivera DOB: 1946-07-22 MRN: [id number]  Date of Service  08/24/2013   HPI/Events of Note  Pt remains hypotensive    eICU Interventions  750cc NS bolus given   Intervention Category Major Interventions: Hypotension - evaluation and management  Shan Levans 08/24/2013, 11:20 PM

## 2013-08-24 NOTE — Anesthesia Postprocedure Evaluation (Signed)
  Anesthesia Post-op Note  Patient: Nathan Rivera  Procedure(s) Performed: Procedure(s) with comments: EXPLORATORY LAPAROTOMY (N/A) - Gram patch closure  Patient Location: PACU  Anesthesia Type:General  Level of Consciousness: Patient remains intubated per anesthesia plan  Airway and Oxygen Therapy: Patient remains intubated per anesthesia plan and Patient placed on Ventilator (see vital sign flow sheet for setting)  Post-op Pain: none  Post-op Assessment: Post-op Vital signs reviewed, Patient's Cardiovascular Status Stable, Respiratory Function Stable, Patent Airway, No signs of Nausea or vomiting and Pain level controlled  Post-op Vital Signs: Reviewed and stable  Complications: No apparent anesthesia complications

## 2013-08-24 NOTE — Transfer of Care (Signed)
Immediate Anesthesia Transfer of Care Note  Patient: Nathan Rivera  Procedure(s) Performed: Procedure(s) with comments: EXPLORATORY LAPAROTOMY (N/A) - Gram patch closure  Patient Location: PACU  Anesthesia Type:General  Level of Consciousness: awake and pateint uncooperative  Airway & Oxygen Therapy: Patient remains intubated per anesthesia plan and Patient placed on Ventilator (see vital sign flow sheet for setting)  Post-op Assessment: Report given to PACU RN  Post vital signs: Reviewed and stable  Complications: No apparent anesthesia complications

## 2013-08-24 NOTE — Progress Notes (Signed)
Pt intubated in OR. Placed on vent in PACU.

## 2013-08-24 NOTE — Consult Note (Signed)
Risks and benefits of surgery discussed with pt and she understands and wishes to proceed. This includes possible bowel resection and/or ostomy 

## 2013-08-24 NOTE — H&P (View-Only) (Signed)
Nathan Rivera 01/10/1946  [id number].    Requesting MD: Dr. Vann Chief Complaint/Reason for Consult: Perforated viscus  HPI:  67 y/o male with PMH HTN, anemia, h/o bladder CA, HLD, tobacco use, nephrolithiasis who was admitted on 08/17/13 to [facility] with hematuria and UTI.  Treated with Cipro and Augmentin but did not improve as an outpatient.  He was found to have septic shock secondary to severe left hydronephrosis secondary to a large calculus in the middle of the left ureter.  Urology at [place] [facility] transferred the patient to [facility] for perc nephrostomy tube placement.  He was found to have frank purulent drainage.  He has complained of severe lower abdominal pain since admission to the hospital.  He also c/o nausea, chills, abdominal distension, vomiting.  The pain started to increase in severity over the last few days.  No radiation of pain.  No alleviating factors, and, any movement or touching his abdomen makes the pain worse.  He was thought to have an ileus s/p perc nephrostomy tube placement, but due to unresolved pain and NG was placed.  This am a CT was obtained which showed interval development of moderate free intraperitoneal gas suggestive of a perforated viscus with unknown etiology.  He notes drinking some alcohol, but not excessively.  He smokes 1ppd, and has been taking some NSAIDs recently for aches/pains.  Normal BM's over the last few weeks and denies any constipation/diarrhea/melena.  Notes bloody vomitus in the hospital.  Prior abdominal surgeries include AAA repair, Right groin hernia repair, umbilical hernia repair.  C/o recurrent hernia in the umbilicus.     ROS: All systems reviewed and otherwise negative except for as above  Family History  Problem Relation Age of Onset  . CVA Father   . Atrial fibrillation Sister     Past Medical History  Diagnosis Date  . Arthritis   . Degenerative disc disease   . Degenerative joint disease   . Aortic aneurysm   . Pneumonia    . Hypercholesterolemia   . Hypertension   . CKD (chronic kidney disease), stage III     Past Surgical History  Procedure Laterality Date  . Abdominal aortic aneurysm repair  2011  . Hernia repair    . Tonsillectomy    . Umbilical hernia repair      50 yr ago    Social History:  reports that he has been smoking Cigarettes.  He has a 40 pack-year smoking history. He does not have any smokeless tobacco history on file. He reports that he drinks about 1.8 ounces of alcohol per week. He reports that he does not use illicit drugs.  Allergies: No Known Allergies  Medications Prior to Admission  Medication Sig Dispense Refill  . amLODipine (NORVASC) 5 MG tablet Take 5 mg by mouth daily.       . cholecalciferol (VITAMIN D) 1000 UNITS tablet Take 1,000 Units by mouth daily.      . etodolac (LODINE) 500 MG tablet Take 500 mg by mouth 2 (two) times daily.      . losartan (COZAAR) 100 MG tablet Take 100 mg by mouth daily.       . simvastatin (ZOCOR) 20 MG tablet Take 20 mg by mouth at bedtime.          Blood pressure 105/69, pulse 109, temperature 98.5 F (36.9 C), temperature source Oral, resp. rate 18, height 5' 11" (1.803 m), weight 244 lb 6.4 oz (110.859 kg), SpO2 89.00%. Physical Exam: General: pleasant, WD/WN   white male who is laying in bed in NAD HEENT: head is normocephalic, atraumatic.  Sclera are noninjected.  PERRL.  Ears and nose without any masses or lesions.  Mouth is pink and moist Heart: regular, rate, and rhythm.  No obvious murmurs, gallops, or rubs noted.  Palpable pedal pulses bilaterally Lungs: CTAB, no wheezes, rhonchi, or rales noted.  Respiratory effort nonlabored Abd: Obese, distended, tympanic, firm, peritoneal signs, +guarding, +BS, umbilical hernia noted, lower abdominal scars noted, no midline abdominal scars MS: all 4 extremities are symmetrical with no cyanosis, clubbing, or edema. Skin: warm and dry with no masses, lesions, or rashes Psych: A&Ox3 with an  appropriate affect.   Results for orders placed during the hospital encounter of 08/18/13 (from the past 48 hour(s))  BASIC METABOLIC PANEL     Status: Abnormal   Collection Time    08/23/13  9:30 AM      Result Value Range   Sodium 147 (*) 135 - 145 mEq/L   Potassium 4.8  3.5 - 5.1 mEq/L   Chloride 119 (*) 96 - 112 mEq/L   CO2 14 (*) 19 - 32 mEq/L   Glucose, Bld 122 (*) 70 - 99 mg/dL   BUN 73 (*) 6 - 23 mg/dL   Creatinine, Ser 2.80 (*) 0.50 - 1.35 mg/dL   Calcium 8.6  8.4 - 10.5 mg/dL   GFR calc non Af Amer 22 (*) >90 mL/min   GFR calc Af Amer 25 (*) >90 mL/min   Comment: (NOTE)     The eGFR has been calculated using the CKD EPI equation.     This calculation has not been validated in all clinical situations.     eGFR's persistently <90 mL/min signify possible Chronic Kidney     Disease.  CBC     Status: Abnormal   Collection Time    08/23/13  9:30 AM      Result Value Range   WBC 40.8 (*) 4.0 - 10.5 K/uL   RBC 4.18 (*) 4.22 - 5.81 MIL/uL   Hemoglobin 12.2 (*) 13.0 - 17.0 g/dL   Comment: RESULTS VERIFIED VIA RECOLLECT   HCT 36.0 (*) 39.0 - 52.0 %   MCV 86.1  78.0 - 100.0 fL   MCH 29.2  26.0 - 34.0 pg   MCHC 33.9  30.0 - 36.0 g/dL   RDW 17.9 (*) 11.5 - 15.5 %   Platelets 309  150 - 400 K/uL  PH, GASTRIC FLUID (GASTROCCULT CARD)     Status: None   Collection Time    08/23/13 11:59 AM      Result Value Range   pH, Gastric POSITIVE    CBC     Status: Abnormal   Collection Time    08/23/13  6:18 PM      Result Value Range   WBC 44.0 (*) 4.0 - 10.5 K/uL   RBC 3.98 (*) 4.22 - 5.81 MIL/uL   Hemoglobin 11.8 (*) 13.0 - 17.0 g/dL   HCT 34.1 (*) 39.0 - 52.0 %   MCV 85.7  78.0 - 100.0 fL   MCH 29.6  26.0 - 34.0 pg   MCHC 34.6  30.0 - 36.0 g/dL   RDW 17.7 (*) 11.5 - 15.5 %   Platelets 328  150 - 400 K/uL  CBC     Status: Abnormal   Collection Time    08/24/13  7:01 AM      Result Value Range   WBC 40.5 (*) 4.0 - 10.5 K/uL     Comment: CONSISTENT WITH PREVIOUS RESULT    RBC 3.86 (*) 4.22 - 5.81 MIL/uL   Hemoglobin 11.4 (*) 13.0 - 17.0 g/dL   HCT 32.9 (*) 39.0 - 52.0 %   MCV 85.2  78.0 - 100.0 fL   MCH 29.5  26.0 - 34.0 pg   MCHC 34.7  30.0 - 36.0 g/dL   RDW 18.0 (*) 11.5 - 15.5 %   Platelets 331  150 - 400 K/uL  BASIC METABOLIC PANEL     Status: Abnormal   Collection Time    08/24/13  7:01 AM      Result Value Range   Sodium 151 (*) 135 - 145 mEq/L   Potassium 4.9  3.5 - 5.1 mEq/L   Chloride 120 (*) 96 - 112 mEq/L   CO2 17 (*) 19 - 32 mEq/L   Glucose, Bld 96  70 - 99 mg/dL   BUN 98 (*) 6 - 23 mg/dL   Creatinine, Ser 4.44 (*) 0.50 - 1.35 mg/dL   Comment: DELTA CHECK NOTED   Calcium 8.5  8.4 - 10.5 mg/dL   GFR calc non Af Amer 13 (*) >90 mL/min   GFR calc Af Amer 14 (*) >90 mL/min   Comment: (NOTE)     The eGFR has been calculated using the CKD EPI equation.     This calculation has not been validated in all clinical situations.     eGFR's persistently <90 mL/min signify possible Chronic Kidney     Disease.  DIFFERENTIAL     Status: Abnormal   Collection Time    08/24/13  7:01 AM      Result Value Range   Neutrophils Relative % 91 (*) 43 - 77 %   Lymphocytes Relative 7 (*) 12 - 46 %   Monocytes Relative 2 (*) 3 - 12 %   Eosinophils Relative 0  0 - 5 %   Basophils Relative 0  0 - 1 %   Neutro Abs 36.9 (*) 1.7 - 7.7 K/uL   Lymphs Abs 2.8  0.7 - 4.0 K/uL   Monocytes Absolute 0.8  0.1 - 1.0 K/uL   Eosinophils Absolute 0.0  0.0 - 0.7 K/uL   Basophils Absolute 0.0  0.0 - 0.1 K/uL   RBC Morphology POLYCHROMASIA PRESENT     WBC Morphology TOXIC GRANULATION     Ct Abdomen Pelvis Wo Contrast  08/24/2013   CLINICAL DATA:  Abdominal pain, sepsis, leukocytosis. "Coffee-ground" fluid draining from NG tube.  EXAM: CT ABDOMEN AND PELVIS WITHOUT CONTRAST  TECHNIQUE: Multidetector CT imaging of the abdomen and pelvis was performed following the standard protocol without intravenous contrast.  COMPARISON:  [facility] CT abdomen pelvis dated 08/18/2013  FINDINGS:  Motion degraded images.  Small right pleural effusion. Associated lower lobe atelectasis bilaterally.  Unenhanced liver, pancreas, and adrenal glands are within normal limits.  Numerous splenic granulomata.  Interval placement of a left percutaneous nephrostomy catheter with decompression of the prior left hydronephrosis. Bilateral renal cysts, poorly evaluated in the absence of intravenous contrast. Stable 10 mm proximal left ureteral calculus (series 2/ image 64).  Atherosclerotic calcifications of the abdominal aorta and branch vessels. Aorto bi-iliac stent graft.  Moderate abdominopelvic ascites, new.  Interval development of moderate free intraperitoneal gas. This would not be accounted for by the nephrostomy placement, which was retroperitoneal. In the absence of interval intraperitoneal procedure, this is worrisome for a perforated hollow viscus. Given the presence of inflammatory stranding (series 2/image 36) and localized gas near the gastric   antrum (series 2/image 32), a perforated gastric ulcer is considered.  No suspicious abdominopelvic lymphadenopathy.  Prostate is unremarkable.  Mildly thick-walled bladder (Series 2/ image 99).  Mild degenerative changes of the visualized thoracolumbar spine.  IMPRESSION: Interval development of moderate free intraperitoneal gas, suggesting a perforated hollow viscus. Although the exact etiology is unclear, a perforated gastric ulcer is considered.  Moderate abdominopelvic ascites, new.  Interval placement of a left percutaneous nephrostomy catheter with decompression of prior left hydronephrosis. Stable 10 mm proximal left ureteral calculus.  Mildly thick-walled bladder, correlate for cystitis.  Critical value/emergent results were called by telephone at the time of interpretation on 08/24/2013 at 12:40 PM to Dr.JESSICA VANN , who verbally acknowledged these results.   Electronically Signed   By: Sriyesh  Krishnan M.D.   On: 08/24/2013 12:44   Dg Chest 1  View  08/24/2013   CLINICAL DATA:  Decreasing oxygen saturation, abdominal pain  EXAM: CHEST - 1 VIEW  COMPARISON:  08/18/2013; 08/17/2013; CT abdomen pelvis -08/24/2013  FINDINGS: Grossly unchanged borderline enlarged cardiac silhouette and mediastinal contours. Interval placement of enteric tube tip and side port projecting below the left hemidiaphragm. Interval removal of left jugular approach central venous catheter. Overall improved aeration of the lungs. There is persistent mild elevation of the right hemidiaphragm and minimal bibasilar opacities, right greater than left. No new focal airspace opacities. Trace right-sided effusion is not excluded. No pneumothorax. Unchanged bones.  IMPRESSION: 1. Overall improved aeration of the lungs suggests resolving edema and/or atelectasis. 2. Persistent mild elevation of the right hemidiaphragm with bibasilar opacities, right greater than left, likely atelectasis. No new focal airspace opacities. 3. Enteric tube tip and side port projects below the left hemidiaphragm.   Electronically Signed   By: Victorio  Watts M.D.   On: 08/24/2013 12:50       Assessment/Plan Perforated viscus with free intraperitoneal gas ?perforated gastric ulcer Hydronephrosis (left)/renal abscess s/p perc nephro tube Bacteremia (ESBL ecoli) ARF  Plan: 1.  Unknown cause of perforated viscus with moderate amount of free air- OR emergently for ex lap, possible repair vs resection of perforation and possible ostomy 2.  NPO, IVF, pain control, antiemetics, antibiotics 3.  Patient, wife, and daughter have agreed to surgery and understands the risks and benefits.   Rivera, Nathan Laible 08/24/2013, 1:40 PM Pager: [phone]  

## 2013-08-24 NOTE — Preoperative (Signed)
Beta Blockers   Reason not to administer Beta Blockers:Not Applicable 

## 2013-08-24 NOTE — Anesthesia Preprocedure Evaluation (Addendum)
Anesthesia Evaluation  Patient identified by MRN, date of birth, ID band Patient awake    Reviewed: Allergy & Precautions, H&P , NPO status , Patient's Chart, lab work & pertinent test results  Airway Mallampati: II TM Distance: >3 FB Neck ROM: Full    Dental  (+) Teeth Intact and Dental Advisory Given   Pulmonary shortness of breath, pneumonia -, unresolved, COPDCurrent Smoker,  + rhonchi   + wheezing      Cardiovascular hypertension, Pt. on medications + Peripheral Vascular Disease Rhythm:Regular Rate:Tachycardia     Neuro/Psych    GI/Hepatic Free air   Endo/Other    Renal/GU ARFRenal diseasenephrolithiasis     Musculoskeletal   Abdominal (+) + obese,   Peds  Hematology   Anesthesia Other Findings sepsis  Reproductive/Obstetrics                         Anesthesia Physical Anesthesia Plan  ASA: III and emergent  Anesthesia Plan: General   Post-op Pain Management:    Induction: Intravenous  Airway Management Planned: Oral ETT  Additional Equipment:   Intra-op Plan:   Post-operative Plan: Possible Post-op intubation/ventilation  Informed Consent: I have reviewed the patients History and Physical, chart, labs and discussed the procedure including the risks, benefits and alternatives for the proposed anesthesia with the patient or authorized representative who has indicated his/her understanding and acceptance.     Plan Discussed with: CRNA and Surgeon  Anesthesia Plan Comments:         Anesthesia Quick Evaluation

## 2013-08-24 NOTE — Progress Notes (Signed)
Anesthesia at bedside. Orders to leave pt intubated and transfer to 2300. Family is aware

## 2013-08-24 NOTE — Op Note (Signed)
08/18/2013 - 08/24/2013  5:07 PM  PATIENT:  Nathan Rivera  67 y.o. male  PRE-OPERATIVE DIAGNOSIS:  free air  POST-OPERATIVE DIAGNOSIS:  Perforated Duodenal Ulcer  PROCEDURE:  Procedure(s) with comments: EXPLORATORY LAPAROTOMY (N/A) - Gram patch closure Omental patch closure perforated duodenal ulcer Primary repair of umbilical hernia  SURGEON:  Surgeon(s) and Role:    * Caleen Essex III, MD - Primary  PHYSICIAN ASSISTANT:   ASSISTANTS: none   ANESTHESIA:   general  EBL:  Total I/O In: 750 [I.V.:500; IV Piggyback:250] Out: 675 [Urine:175; Emesis/NG output:500]  BLOOD ADMINISTERED:none  DRAINS: (2) Jackson-Pratt drain(s) with closed bulb suction in the space around the stomach and duodenum   LOCAL MEDICATIONS USED:  NONE  SPECIMEN:  No Specimen  DISPOSITION OF SPECIMEN:  N/A  COUNTS:  YES  TOURNIQUET:  * No tourniquets in log *  DICTATION: .Dragon Dictation After informed consent was obtained the patient was brought to the operating room and placed in the supine position on the operating room table. After adequate induction of general anesthesia the patient's abdomen was prepped with ChloraPrep, allowed to dry, and draped in usual sterile manner. An upper midline incision was made with the 10 blade knife. This incision was carried through the skin and subcutaneous tissue sharply with electrocautery until the linea alba was identified. The linea alba was also incised with the electrocautery. The preperitoneal space was probed bluntly with a finger into the peritoneum was opened and access was gained to the abdominal cavity. The rest of the incision was opened under direct vision with the electrocautery. A large amount of bilious fluid was identified. The stomach and duodenum were examined and there was a perforation Of the anterior wall of the duodenum just past the pylorus. A tongue of omentum was mobilized with the LigaSure. The perforation was then closed with interrupted 2-0  silk stitches incorporating the omental tongue into the closure. Once this was accomplished the edges of the omentum were then tacked to the anterior surface of the duodenum and stomach. The abdomen was then irrigated with copious amounts of saline until the effluent was clear. The small bowel was run from the ligament of Treitz to the ileocecal valve and no other abnormalities were noted. A stab incision was then made with a 15 blade knife on either side of the abdominal wall. A tonsil clamp was placed through each of these incisions and used to bring a 19 [place] round Blake drain into the abdominal cavity on each side. Both drains were placed around the duodenal repair. The drains were anchored to the skin with 2-0 nylon stitches. The patient had a moderate-sized umbilical hernia as well and the fascial opening Was opened through the hernia sac. The hernia sac was excised sharply with the electrocautery. The fascial edges appeared healthy. At this point the abdominal wall was closed with 2 #1 double-stranded looped PDS sutures which also primarily repaired the hernia. The subcutaneous tissue was packed with moistened Kerlix gauze and sterile dressings were applied. The drains were placed to bulb suction and there was a good seal. The patient tolerated the procedure well. At the end of the case all needle sponge and instrument counts were correct. The patient was then awakened and taken to recovery in stable condition.  PLAN OF CARE: Admit to inpatient   PATIENT DISPOSITION:  ICU - extubated and stable.   Delay start of Pharmacological VTE agent (>24hrs) due to surgical blood loss or risk of bleeding: yes

## 2013-08-24 NOTE — Progress Notes (Signed)
CT scan back with mod amt of free air- surgery updated, appreciate their assistance, and family updated.  Added vanc to abx.   Marlin Canary DO

## 2013-08-24 NOTE — H&P (View-Only) (Signed)
Risks and benefits of surgery discussed with pt and she understands and wishes to proceed. This includes possible bowel resection and/or ostomy

## 2013-08-24 NOTE — Progress Notes (Addendum)
ANTIBIOTIC CONSULT NOTE - FOLLOW UP  Pharmacy Consult for Primaxin/Vanco Indication: Sepsis and kidney abscess + perforated ulcer  No Known Allergies  Patient Measurements: Height: 5\' 11"  (180.3 cm) Weight: 244 lb 6.4 oz (110.859 kg) IBW/kg (Calculated) : 75.3 Adjusted Body Weight:    Vital Signs: Temp: 98.5 F (36.9 C) (11/29 0936) Temp src: Oral (11/29 0704) BP: 105/69 mmHg (11/29 0936) Pulse Rate: 109 (11/29 0936) Intake/Output from previous day: 11/28 0701 - 11/29 0700 In: 2240 [I.V.:1840; IV Piggyback:400] Out: 2500 [Urine:500; Emesis/NG output:2000] Intake/Output from this shift: Total I/O In: 0  Out: 175 [Urine:175]  Labs:  Recent Labs  08/22/13 0558 08/23/13 0930 08/23/13 1818 08/24/13 0701  WBC 15.5* 40.8* 44.0* 40.5*  HGB 10.0* 12.2* 11.8* 11.4*  PLT 244 309 328 331  CREATININE 2.35* 2.80*  --  4.44*   Estimated Creatinine Clearance: 20.4 ml/min (by C-G formula based on Cr of 4.44). No results found for this basename: VANCOTROUGH, Leodis Binet, VANCORANDOM, GENTTROUGH, GENTPEAK, GENTRANDOM, TOBRATROUGH, TOBRAPEAK, TOBRARND, AMIKACINPEAK, AMIKACINTROU, AMIKACIN,  in the last 72 hours   Microbiology: Recent Results (from the past 720 hour(s))  MRSA PCR SCREENING     Status: None   Collection Time    08/18/13  4:18 PM      Result Value Range Status   MRSA by PCR NEGATIVE  NEGATIVE Final   Comment:            The GeneXpert MRSA Assay (FDA     approved for NASAL specimens     only), is one component of a     comprehensive MRSA colonization     surveillance program. It is not     intended to diagnose MRSA     infection nor to guide or     monitor treatment for     MRSA infections.  URINE CULTURE     Status: None   Collection Time    08/18/13  5:13 PM      Result Value Range Status   Specimen Description URINE, CLEAN CATCH   Final   Special Requests NONE   Final   Culture  Setup Time     Final   Value: 08/19/2013 00:11     Performed at [facility] Count     Final   Value: 70,000 COLONIES/ML     Performed at [facility]   Culture     Final   Value: ESCHERICHIA COLI     Note: Confirmed Extended Spectrum Beta-Lactamase Producer (ESBL) CEFOXITIN SENSITIVE <=4     Performed at [facility]   Report Status 08/21/2013 FINAL   Final   Organism ID, Bacteria ESCHERICHIA COLI   Final  CULTURE, ROUTINE-ABSCESS     Status: None   Collection Time    08/18/13  6:49 PM      Result Value Range Status   Specimen Description ABSCESS LEFT KIDNEY   Final   Special Requests NONE   Final   Gram Stain     Final   Value: ABUNDANT WBC PRESENT,BOTH PMN AND MONONUCLEAR     NO SQUAMOUS EPITHELIAL CELLS SEEN     MODERATE GRAM NEGATIVE RODS     Performed at [facility]   Culture     Final   Value: ABUNDANT ESCHERICHIA COLI     Note: Confirmed Extended Spectrum Beta-Lactamase Producer (ESBL) CRITICAL RESULT CALLED TO, READ BACK BY AND VERIFIED WITH: ALBERT R 08/21/13 AT 810 BY [facility]  Performed at [facility]   Report Status 08/21/2013 FINAL   Final   Organism ID, Bacteria ESCHERICHIA COLI   Final  ANAEROBIC CULTURE     Status: None   Collection Time    08/18/13  6:54 PM      Result Value Range Status   Specimen Description ABSCESS LEFT KIDNEY   Final   Special Requests NONE   Final   Gram Stain     Final   Value: ABUNDANT WBC PRESENT,BOTH PMN AND MONONUCLEAR     NO SQUAMOUS EPITHELIAL CELLS SEEN     MODERATE GRAM NEGATIVE RODS     Performed at [facility]   Culture     Final   Value: NO ANAEROBES ISOLATED     Performed at [facility]   Report Status 08/23/2013 FINAL   Final  CULTURE, BLOOD (ROUTINE X 2)     Status: None   Collection Time    08/18/13  7:05 PM      Result Value Range Status   Specimen Description BLOOD RIGHT HAND   Final   Special Requests BOTTLES DRAWN AEROBIC AND ANAEROBIC 10CC   Final   Culture  Setup Time     Final   Value: 08/19/2013 00:10      Performed at [facility]   Culture     Final   Value: NO GROWTH 5 DAYS     Performed at [facility]   Report Status 08/24/2013 FINAL   Final  CULTURE, BLOOD (ROUTINE X 2)     Status: None   Collection Time    08/18/13  9:45 PM      Result Value Range Status   Specimen Description BLOOD LEFT HAND   Final   Special Requests BOTTLES DRAWN AEROBIC AND ANAEROBIC 10CC EA   Final   Culture  Setup Time     Final   Value: 08/19/2013 02:54     Performed at [facility]   Culture     Final   Value:        BLOOD CULTURE RECEIVED NO GROWTH TO DATE CULTURE WILL BE HELD FOR 5 DAYS BEFORE ISSUING A FINAL NEGATIVE REPORT     Performed at [facility]   Report Status PENDING   Incomplete  CULTURE, BLOOD (ROUTINE X 2)     Status: None   Collection Time    08/20/13  4:40 PM      Result Value Range Status   Specimen Description BLOOD LEFT ARM   Final   Special Requests BOTTLES DRAWN AEROBIC ONLY 3CC   Final   Culture  Setup Time     Final   Value: 08/21/2013 02:13     Performed at [facility]   Culture     Final   Value:        BLOOD CULTURE RECEIVED NO GROWTH TO DATE CULTURE WILL BE HELD FOR 5 DAYS BEFORE ISSUING A FINAL NEGATIVE REPORT     Performed at [facility]   Report Status PENDING   Incomplete  CULTURE, BLOOD (ROUTINE X 2)     Status: None   Collection Time    08/20/13  4:55 PM      Result Value Range Status   Specimen Description BLOOD RIGHT ARM   Final   Special Requests BOTTLES DRAWN AEROBIC ONLY 10CC   Final   Culture  Setup Time     Final   Value: 08/21/2013  02:13     Performed at [facility]     Final   Value:        BLOOD CULTURE RECEIVED NO GROWTH TO DATE CULTURE WILL BE HELD FOR 5 DAYS BEFORE ISSUING A FINAL NEGATIVE REPORT     Performed at [facility]   Report Status PENDING   Incomplete    Anti-infectives   Start     Dose/Rate Route Frequency Ordered Stop   08/20/13 1200  imipenem-cilastatin  (PRIMAXIN) 250 mg in sodium chloride 0.9 % 100 mL IVPB     250 mg 200 mL/hr over 30 Minutes Intravenous 4 times per day 08/20/13 1132     08/18/13 2200  ceFEPIme (MAXIPIME) 1 g in dextrose 5 % 50 mL IVPB  Status:  Discontinued     1 g 100 mL/hr over 30 Minutes Intravenous Every 12 hours 08/18/13 1846 08/18/13 1847   08/18/13 2000  ceFEPIme (MAXIPIME) 1 g in dextrose 5 % 50 mL IVPB  Status:  Discontinued     1 g 100 mL/hr over 30 Minutes Intravenous Every 24 hours 08/18/13 1728 08/18/13 1846   08/18/13 1900  ceFEPIme (MAXIPIME) 1 g in dextrose 5 % 50 mL IVPB  Status:  Discontinued     1 g 100 mL/hr over 30 Minutes Intravenous Every 24 hours 08/18/13 1847 08/20/13 1132      Assessment: Assessment: 67 YOM with hematuria and UTI (txed outpt w/Cipro and Augmentin) with lethargy and hypotension, tranfx to [facility] 11/23 for emergent nephrostomy tube placement for severe left-sided hydronephrosis.   PMH: OA, DJD, aortic aneurysm, HLD, HTN, CKD  AC: None PTA, SCDs for VTE proph.  ID: Primaxin D#5/14 for GN sepsis/L kidney abscess - WBC 15.5 911/28) upon recheck 40.8, 44, 40.5 , Afebrile, Scr back up to 4.44. UOP only 0.2. --ID: IV carbapenem therapy for 2 weeks MINIMUM beyond date of 11/ 24 AND until resolution of his renal abscess, which may take quite a bit longer. ie 4-6 weeks WITH repeat CT to assure it has resolved  Cefepime 11/22 >>11/25 Vanc 11/22 >> 11/23 (1g received), 11/29>> Primaxin 11/25>>  11/25: BC x 2 11/23 L-Kidney abscess: ESBL E. Coli (S: cefoxitin, imipenem, zosyn; R: all other abx) 11/23 Blood >>NGTD 11/23 Urine >> ESBL E.Coli (S: imipenem, nitrofurantoin; R: to all other abx) 11/23 MRSA PCR neg 11/22 Blood ([place]) >> GNR 11/22 Urine ([place]) >> 20K GNR  CV: Hx HTN, HLD - 105/69, HR 110-118  Endo: No hx - Glucose 96, no insulin  GI/Nutr: LFTs trending down, Tbili WNL. Mild ileus, passing gas but +nausea. NG tube with 2L out overnight. IV PPI. Surgery  consulted  Neuro: Hx OA, DJD - A&O  Renal: Hx CKD, bladder ca - Scr 4.08 > 2.35> 4.44 , K 4.9, Phos 5.6, Mg WNL - s/p perc nephrostomy. UOP 0.2. Na up to 151  Pulm: 89% RA, h/o tobacco  Heme/Onc: Hx bladder ca -  Best Practices: SCDs   Goal of Therapy:  Coverage for ESBL Ecoli  Plan:  - Change Primaxin 250mg  IV Q12 for renal function - F/u renal fxn, C&S, clinical status and LOT - Vanco 2250mg  load then, 1500mg /48h   Jonhatan Hearty S. Merilynn Finland, PharmD, BCPS Clinical Staff Pharmacist Pager [phone]  Misty Stanley Stillinger 08/24/2013,12:44 PM

## 2013-08-24 NOTE — Interval H&P Note (Signed)
History and Physical Interval Note:  08/24/2013 2:47 PM  Nathan Rivera  has presented today for surgery, with the diagnosis of free air  The various methods of treatment have been discussed with the patient and family. After consideration of risks, benefits and other options for treatment, the patient has consented to  Procedure(s): EXPLORATORY LAPAROTOMY (N/A) Possible SMALL BOWEL RESECTION (N/A) Possible OSTOMY (N/A) as a surgical intervention .  The patient's history has been reviewed, patient examined, no change in status, stable for surgery.  I have reviewed the patient's chart and labs.  Questions were answered to the patient's satisfaction.     TOTH III,Labrina Lines S

## 2013-08-25 LAB — BASIC METABOLIC PANEL
CO2: 14 mEq/L — ABNORMAL LOW (ref 19–32)
Calcium: 7.4 mg/dL — ABNORMAL LOW (ref 8.4–10.5)
GFR calc Af Amer: 14 mL/min — ABNORMAL LOW (ref >90)
GFR calc non Af Amer: 12 mL/min — ABNORMAL LOW (ref >90)
Glucose, Bld: 99 mg/dL (ref 70–99)
Potassium: 4.9 mEq/L (ref 3.5–5.1)
Sodium: 148 mEq/L — ABNORMAL HIGH (ref 135–145)

## 2013-08-25 LAB — CULTURE, BLOOD (ROUTINE X 2): Culture: NO GROWTH

## 2013-08-25 LAB — CBC
Hemoglobin: 9.4 g/dL — ABNORMAL LOW (ref 13.0–17.0)
MCH: 29.7 pg (ref 26.0–34.0)
MCHC: 34.2 g/dL (ref 30.0–36.0)
Platelets: 321 10*3/uL (ref 150–400)
RDW: 18.3 % — ABNORMAL HIGH (ref 11.5–15.5)

## 2013-08-25 LAB — LACTIC ACID, PLASMA: Lactic Acid, Venous: 1.1 mmol/L (ref 0.5–2.2)

## 2013-08-25 MED ORDER — FENTANYL CITRATE 0.05 MG/ML IJ SOLN
12.5000 ug | INTRAMUSCULAR | Status: DC | PRN
  Administered 2013-08-25 – 2013-08-26 (×7): 25 ug via INTRAVENOUS
  Filled 2013-08-25 (×6): qty 2

## 2013-08-25 NOTE — Procedures (Signed)
Extubation Procedure Note  Patient Details:   Name: Nathan Rivera DOB: Jun 11, 1946 MRN: [id number]   Airway Documentation:  Airway 7.5 mm (Active)  Secured at (cm) 24 cm 08/25/2013  7:55 AM  Measured From Lips 08/25/2013  7:55 AM  Secured Location Center 08/25/2013  7:55 AM  Secured By [employer] 08/25/2013  7:55 AM  Tube Holder Repositioned Yes 08/25/2013  7:55 AM  Cuff Pressure (cm H2O) 28 cm H2O 08/25/2013  4:11 AM  Site Condition Dry 08/25/2013  7:55 AM    Evaluation  O2 sats: stable throughout Complications: No apparent complications Patient did tolerate procedure well. Bilateral Breath Sounds: Clear;Diminished Suctioning: Airway Yes  Pt placed on [place] 4L and tolerating well.   Devra Dopp D 08/25/2013, 9:06 AM

## 2013-08-25 NOTE — Progress Notes (Signed)
[facility]  Day # 6 imipenem Day #2 fluconazole  Subjective: Afebrile. Feeling "rough", still poorly. Uncomfortable sitting in chair  24hr event: POD#1 from laparotomy and graham patch for duodenal ulcer perforation. WBC now down to 26.6.   Antibiotics:  Anti-infectives   Start     Dose/Rate Route Frequency Ordered Stop   08/26/13 1400  vancomycin (VANCOCIN) 1,500 mg in sodium chloride 0.9 % 500 mL IVPB  Status:  Discontinued     1,500 mg 250 mL/hr over 120 Minutes Intravenous Every 48 hours 08/24/13 1301 08/24/13 2004   08/25/13 1800  imipenem-cilastatin (PRIMAXIN) 250 mg in sodium chloride 0.9 % 100 mL IVPB     250 mg 200 mL/hr over 30 Minutes Intravenous Every 12 hours 08/24/13 1245     08/24/13 2345  fluconazole (DIFLUCAN) IVPB 200 mg     200 mg 100 mL/hr over 60 Minutes Intravenous Every 24 hours 08/24/13 2332     08/24/13 1315  vancomycin (VANCOCIN) 2,250 mg in sodium chloride 0.9 % 500 mL IVPB  Status:  Discontinued     2,250 mg 250 mL/hr over 120 Minutes Intravenous  Once 08/24/13 1301 08/24/13 2004   08/20/13 1200  imipenem-cilastatin (PRIMAXIN) 250 mg in sodium chloride 0.9 % 100 mL IVPB  Status:  Discontinued     250 mg 200 mL/hr over 30 Minutes Intravenous 4 times per day 08/20/13 1132 08/24/13 1245   08/18/13 2200  ceFEPIme (MAXIPIME) 1 g in dextrose 5 % 50 mL IVPB  Status:  Discontinued     1 g 100 mL/hr over 30 Minutes Intravenous Every 12 hours 08/18/13 1846 08/18/13 1847   08/18/13 2000  ceFEPIme (MAXIPIME) 1 g in dextrose 5 % 50 mL IVPB  Status:  Discontinued     1 g 100 mL/hr over 30 Minutes Intravenous Every 24 hours 08/18/13 1728 08/18/13 1846   08/18/13 1900  ceFEPIme (MAXIPIME) 1 g in dextrose 5 % 50 mL IVPB  Status:  Discontinued     1 g 100 mL/hr over 30 Minutes Intravenous Every 24 hours 08/18/13 1847 08/20/13 1132      Medications: Scheduled Meds: . albuterol  4 puff Inhalation Q4H  . antiseptic oral rinse  15 mL  Mouth Rinse QID  . chlorhexidine  15 mL Mouth Rinse BID  . fluconazole (DIFLUCAN) IV  200 mg Intravenous Q24H  . imipenem-cilastatin  250 mg Intravenous Q12H  . pantoprazole (PROTONIX) IV  40 mg Intravenous Q12H    Objective: Weight change:   Intake/Output Summary (Last 24 hours) at 08/25/13 1206 Last data filed at 08/25/13 1100  Gross per 24 hour  Intake 3711.25 ml  Output    790 ml  Net 2921.25 ml   Blood pressure 111/52, pulse 105, temperature 99.6 F (37.6 C), temperature source Oral, resp. rate 17, height 5\' 11"  (1.803 m), weight 239 lb (108.41 kg), SpO2 98.00%. Temp:  [97.4 F (36.3 C)-99.6 F (37.6 C)] 99.6 F (37.6 C) (11/30 0814) Pulse Rate:  [97-121] 105 (11/30 1100) Resp:  [13-24] 17 (11/30 1100) BP: (77-131)/(37-63) 111/52 mmHg (11/30 1100) SpO2:  [87 %-100 %] 98 % (11/30 1100) FiO2 (%):  [40 %-100 %] 40 % (11/30 0800) Weight:  [239 lb (108.41 kg)] 239 lb (108.41 kg) (11/29 2030)  Physical Exam: General: Alert and awake, oriented x3, not in any acute distress. Sitting up in chair, ng in place HEENT: anicteric sclera, pupils reactive to light and accommodation, EOMI, oropharynx clear and without exudate  CVS regular rate, normal r, no murmur rubs or gallops  Chest: clear to auscultation bilaterally, no wheezing, rales or rhonchi  Abdomen: mildly distended, abdomen wrapped. Soft, 2 jp drain has serosanginous, neprhostomy tube with amber fluid  Extremities: no clubbing or edema noted bilaterally  Skin: no rashes  Neuro: nonfocal, strength and sensation intact   Lab Results:  Recent Labs  08/24/13 0701 08/25/13 0404  WBC 40.5* 26.6*  HGB 11.4* 9.4*  HCT 32.9* 27.5*  PLT 331 321    BMET  Recent Labs  08/24/13 0701 08/25/13 0404  NA 151* 148*  K 4.9 4.9  CL 120* 119*  CO2 17* 14*  GLUCOSE 96 99  BUN 98* 102*  CREATININE 4.44* 4.59*  CALCIUM 8.5 7.4*    Micro Results: 11/25 blood cx x 2: NGTD 11/23 blood cx NGTD 11/23 renal abscess fluid ;  esbl ecoli 11/23 urine cx: 70,000cfu: esbl ecoli  Studies/Results: Ct Abdomen Pelvis Wo Contrast  08/24/2013   CLINICAL DATA:  Abdominal pain, sepsis, leukocytosis. "Coffee-ground" fluid draining from NG tube.  EXAM: CT ABDOMEN AND PELVIS WITHOUT CONTRAST  TECHNIQUE: Multidetector CT imaging of the abdomen and pelvis was performed following the standard protocol without intravenous contrast.  COMPARISON:  [facility] CT abdomen pelvis dated 08/18/2013  FINDINGS: Motion degraded images.  Small right pleural effusion. Associated lower lobe atelectasis bilaterally.  Unenhanced liver, pancreas, and adrenal glands are within normal limits.  Numerous splenic granulomata.  Interval placement of a left percutaneous nephrostomy catheter with decompression of the prior left hydronephrosis. Bilateral renal cysts, poorly evaluated in the absence of intravenous contrast. Stable 10 mm proximal left ureteral calculus (series 2/ image 64).  Atherosclerotic calcifications of the abdominal aorta and branch vessels. Aorto bi-iliac stent graft.  Moderate abdominopelvic ascites, new.  Interval development of moderate free intraperitoneal gas. This would not be accounted for by the nephrostomy placement, which was retroperitoneal. In the absence of interval intraperitoneal procedure, this is worrisome for a perforated hollow viscus. Given the presence of inflammatory stranding (series 2/image 36) and localized gas near the gastric antrum (series 2/image 32), a perforated gastric ulcer is considered.  No suspicious abdominopelvic lymphadenopathy.  Prostate is unremarkable.  Mildly thick-walled bladder (Series 2/ image 99).  Mild degenerative changes of the visualized thoracolumbar spine.  IMPRESSION: Interval development of moderate free intraperitoneal gas, suggesting a perforated hollow viscus. Although the exact etiology is unclear, a perforated gastric ulcer is considered.  Moderate abdominopelvic ascites, new.  Interval placement of a  left percutaneous nephrostomy catheter with decompression of prior left hydronephrosis. Stable 10 mm proximal left ureteral calculus.  Mildly thick-walled bladder, correlate for cystitis.  Critical value/emergent results were called by telephone at the time of interpretation on 08/24/2013 at 12:40 PM to Dr.JESSICA [facility] , who verbally acknowledged these results.   Electronically Signed   By: Charline Bills M.D.   On: 08/24/2013 12:44   Dg Chest 1 View  08/24/2013   CLINICAL DATA:  Decreasing oxygen saturation, abdominal pain  EXAM: CHEST - 1 VIEW  COMPARISON:  08/18/2013; 08/17/2013; CT abdomen pelvis -08/24/2013  FINDINGS: Grossly unchanged borderline enlarged cardiac silhouette and mediastinal contours. Interval placement of enteric tube tip and side port projecting below the left hemidiaphragm. Interval removal of left jugular approach central venous catheter. Overall improved aeration of the lungs. There is persistent mild elevation of the right hemidiaphragm and minimal bibasilar opacities, right greater than left. No new focal airspace opacities. Trace right-sided effusion is not excluded. No pneumothorax. Unchanged  bones.  IMPRESSION: 1. Overall improved aeration of the lungs suggests resolving edema and/or atelectasis. 2. Persistent mild elevation of the right hemidiaphragm with bibasilar opacities, right greater than left, likely atelectasis. No new focal airspace opacities. 3. Enteric tube tip and side port projects below the left hemidiaphragm.   Electronically Signed   By: Simonne Come M.D.   On: 08/24/2013 12:50   Portable Chest Xray  08/24/2013   CLINICAL DATA:  Endotracheal tube placement.  EXAM: PORTABLE CHEST - 1 VIEW  COMPARISON:  08/24/2013 at 1143 hr wound.  FINDINGS: Endotracheal tube is been placed with tip measuring 4.7 cm above the carinal. Enteric tube remains in place without evidence of change. The tip is off the field of view. There is shallow inspiration with linear atelectasis  developing in both lung bases. Probable small right pleural effusion. Normal heart size and pulmonary vascularity. No pneumothorax.  IMPRESSION: Endotracheal tube tip measures about 4.7 cm above the carina. Increasing atelectasis in the lung bases. Probable small right pleural effusion.   Electronically Signed   By: Burman Nieves M.D.   On: 08/24/2013 22:21      Assessment/Plan: Nathan Rivera is a 67 y.o. 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 ie 4-6 weeks WITH repeat CT to assure it has resolved  2) perforated duodenal ulcer = causing marked leukocytosis/gi bleed. Currently on imipenem plus fluconazole, renally dosed, which was added empirically.  - will get specimen from jp to send for culture to see if evidence of fungal infection  3) aki = cr still elevated. Appears still pre-renal .would bolus IVF to see if improvement  4) marked leukocytosis = improving since surgery.   LOS: 7 days   Brittin Janik 08/25/2013, 12:06 PM

## 2013-08-25 NOTE — Progress Notes (Signed)
1 Day Post-Op  Subjective: Alert. He is medically stable. Extubated. Wife in room Pain control adequate but still sore.  NG tube functioning. J-P drain serosanguineous, odorless. Adequate urine output.  Objective: Vital signs in last 24 hours: Temp:  [97.4 F (36.3 C)-99.6 F (37.6 C)] 99.6 F (37.6 C) (11/30 0814) Pulse Rate:  [97-121] 110 (11/30 0700) Resp:  [13-24] 19 (11/30 0700) BP: (77-131)/(37-63) 106/53 mmHg (11/30 0700) SpO2:  [87 %-100 %] 98 % (11/30 1017) FiO2 (%):  [40 %-100 %] 40 % (11/30 0755) Weight:  [239 lb (108.41 kg)] 239 lb (108.41 kg) (11/29 2030) Last BM Date: 08/22/13  Intake/Output from previous day: 11/29 0701 - 11/30 0700 In: 3181.3 [I.V.:2331.3; NG/GT:60; IV Piggyback:500] Out: 1300 [Urine:510; Emesis/NG output:700; Drains:90] Intake/Output this shift:    General appearance: Mental status normal. Mild to moderate distress. Does not look toxic. Color good. Resp: clear to auscultation bilaterally GI: large abdomen. Reasonably soft. Wound clean and intact. Appropriately tender. JP drain functioning.  Lab Results:  Results for orders placed during the hospital encounter of 08/18/13 (from the past 24 hour(s))  GLUCOSE, CAPILLARY     Status: None   Collection Time    08/24/13  2:03 PM      Result Value Range   Glucose-Capillary 83  70 - 99 mg/dL  GLUCOSE, CAPILLARY     Status: Abnormal   Collection Time    08/24/13  6:29 PM      Result Value Range   Glucose-Capillary 111 (*) 70 - 99 mg/dL  BLOOD GAS, ARTERIAL     Status: Abnormal   Collection Time    08/24/13  6:50 PM      Result Value Range   FIO2 1.00     Delivery systems VENTILATOR     Mode PRESSURE REGULATED VOLUME CONTROL     VT 600     Rate 16     Peep/cpap 5.0     pH, Arterial 7.164 (*) 7.350 - 7.450   pCO2 arterial 40.1  35.0 - 45.0 mmHg   pO2, Arterial 84.5  80.0 - 100.0 mmHg   Bicarbonate 13.8 (*) 20.0 - 24.0 mEq/L   TCO2 15.1  0 - 100 mmol/L   Acid-base deficit 13.1 (*) 0.0 -  2.0 mmol/L   O2 Saturation 92.9     Patient temperature 98.6     Collection site RIGHT RADIAL     Drawn by [phone]     Sample type ARTERIAL DRAW     Allens test (pass/fail) PASS  PASS  MRSA PCR SCREENING     Status: None   Collection Time    08/24/13  8:07 PM      Result Value Range   MRSA by PCR NEGATIVE  NEGATIVE  POCT I-STAT 3, BLOOD GAS (G3+)     Status: Abnormal   Collection Time    08/24/13 11:37 PM      Result Value Range   pH, Arterial 7.273 (*) 7.350 - 7.450   pCO2 arterial 28.9 (*) 35.0 - 45.0 mmHg   pO2, Arterial 84.0  80.0 - 100.0 mmHg   Bicarbonate 13.4 (*) 20.0 - 24.0 mEq/L   TCO2 14  0 - 100 mmol/L   O2 Saturation 95.0     Acid-base deficit 12.0 (*) 0.0 - 2.0 mmol/L   Patient temperature 97.8 F     Collection site RADIAL, ALLEN'S TEST ACCEPTABLE     Drawn by RT     Sample type ARTERIAL  LACTIC ACID, PLASMA     Status: None   Collection Time    08/25/13 12:50 AM      Result Value Range   Lactic Acid, Venous 1.1  0.5 - 2.2 mmol/L  CBC     Status: Abnormal   Collection Time    08/25/13  4:04 AM      Result Value Range   WBC 26.6 (*) 4.0 - 10.5 K/uL   RBC 3.17 (*) 4.22 - 5.81 MIL/uL   Hemoglobin 9.4 (*) 13.0 - 17.0 g/dL   HCT 16.1 (*) 09.6 - 04.5 %   MCV 86.8  78.0 - 100.0 fL   MCH 29.7  26.0 - 34.0 pg   MCHC 34.2  30.0 - 36.0 g/dL   RDW 40.9 (*) 81.1 - 91.4 %   Platelets 321  150 - 400 K/uL  BASIC METABOLIC PANEL     Status: Abnormal   Collection Time    08/25/13  4:04 AM      Result Value Range   Sodium 148 (*) 135 - 145 mEq/L   Potassium 4.9  3.5 - 5.1 mEq/L   Chloride 119 (*) 96 - 112 mEq/L   CO2 14 (*) 19 - 32 mEq/L   Glucose, Bld 99  70 - 99 mg/dL   BUN 782 (*) 6 - 23 mg/dL   Creatinine, Ser 9.56 (*) 0.50 - 1.35 mg/dL   Calcium 7.4 (*) 8.4 - 10.5 mg/dL   GFR calc non Af Amer 12 (*) >90 mL/min   GFR calc Af Amer 14 (*) >90 mL/min     Studies/Results: @RISRSLT24 @  . albuterol  4 puff Inhalation Q4H  . antiseptic oral rinse  15 mL Mouth  Rinse QID  . chlorhexidine  15 mL Mouth Rinse BID  . fluconazole (DIFLUCAN) IV  200 mg Intravenous Q24H  . imipenem-cilastatin  250 mg Intravenous Q12H  . pantoprazole (PROTONIX) IV  40 mg Intravenous Q12H     Assessment/Plan: s/p Procedure(s): EXPLORATORY LAPAROTOMY  POD #1. Laparotomy and Cheree Ditto patch closure of perforated duodenal ulcer and placement of drains. Continue imipenem and Diflucan Continue NG suction and Foley catheter Mobilize out of bed to chair Continue double dose proton pump inhibitors  Recent episode of severe sepsis secondary to urinary tract infection.  Escherichia coli bacteremia  Hydronephrosis, left renal abscess, status post percutaneous nephrostomy tube.  Acute on chronic renal failure.  Peripheral vascular disease  Hypertension  @PROBHOSP @  LOS: 7 days    Sophronia Varney M 08/25/2013  . .prob

## 2013-08-25 NOTE — Progress Notes (Signed)
Physical Therapy Discharge Patient Details Name: Nathan Rivera MRN: [id number] DOB: [birth year] Today's Date: 08/25/2013 Time:  -     Patient discharged from PT services secondary to medical decline - will need to re-order PT to resume therapy services.  Please see latest therapy progress note for current level of functioning and progress toward goals.      GP     [name] 08/25/2013, 8:12 AM   [facility] PT [phone]

## 2013-08-25 NOTE — Progress Notes (Signed)
PULMONARY  / CRITICAL CARE MEDICINE  Name: Nathan Rivera MRN: [id number] DOB: 10/07/1945    ADMISSION DATE:  08/18/2013 CONSULTATION DATE: 08/24/2013  REFERRING MD :  SICU PRIMARY SERVICE:  SICU  CHIEF COMPLAINT:  Post Laparotomy   BRIEF PATIENT DESCRIPTION: 67 y/o admitted for ESBL E-Coli Sepsis, hydro s/p perc neph.  Found to have free air / bowel perf s/p laparotomy.  Returned to ICU on vent.    SIGNIFICANT EVENTS / STUDIES:  11/23 - Post left percutaneous nephrostomy.  11/24 - Left IJ CVC,  d/c 11/25    LINES / TUBES: ETT 11/29>>>  CULTURES: 11/23 blood culture>>> ESBL positive E. Coli (2/2) from [place]  11/23 aerobic culture>>>  11/23 urine culture>>>E-Coli>>>sens imipenem, nitrofurantoin   ANTIBIOTICS: Cefepime 11/23 >>> 11/25  Primaxin 11/25 >>>    SUBJECTIVE: RT reports pt weaning on PSV, no distress  VITAL SIGNS: Temp:  [97.4 F (36.3 C)-98.5 F (36.9 C)] 98.5 F (36.9 C) (11/30 0404) Pulse Rate:  [97-121] 110 (11/30 0700) Resp:  [13-24] 19 (11/30 0700) BP: (77-131)/(37-69) 106/53 mmHg (11/30 0700) SpO2:  [87 %-100 %] 97 % (11/30 0700) FiO2 (%):  [40 %-100 %] 40 % (11/30 0755) Weight:  [239 lb (108.41 kg)] 239 lb (108.41 kg) (11/29 2030)  PHYSICAL EXAMINATION:  General: pleasant, WD/WN white male in NAD HEENT: head is normocephalic, atraumatic. Sclera are noninjected. PERRL.  Mouth is pink and moist  Heart: regular, rate, and rhythm. No obvious murmurs, gallops, or rubs noted. Palpable pedal pulses bilaterally  Lungs: CTAB, no wheezes, rhonchi, or rales noted. Respiratory effort nonlabored  Abd: Obese, midline dressing in place  MS: all 4 extremities are symmetrical with no cyanosis, clubbing, or edema.  Skin: warm and dry with no masses, lesions, or rashes  Psych: sedated intubated .   Recent Labs Lab 08/23/13 0930 08/24/13 0701 08/25/13 0404  NA 147* 151* 148*  K 4.8 4.9 4.9  CL 119* 120* 119*  CO2 14* 17* 14*  BUN 73* 98* 102*   CREATININE 2.80* 4.44* 4.59*  GLUCOSE 122* 96 99    Recent Labs Lab 08/23/13 1818 08/24/13 0701 08/25/13 0404  HGB 11.8* 11.4* 9.4*  HCT 34.1* 32.9* 27.5*  WBC 44.0* 40.5* 26.6*  PLT 328 331 321   Ct Abdomen Pelvis Wo Contrast  08/24/2013   CLINICAL DATA:  Abdominal pain, sepsis, leukocytosis. "Coffee-ground" fluid draining from NG tube.  EXAM: CT ABDOMEN AND PELVIS WITHOUT CONTRAST  TECHNIQUE: Multidetector CT imaging of the abdomen and pelvis was performed following the standard protocol without intravenous contrast.  COMPARISON:  [facility] CT abdomen pelvis dated 08/18/2013  FINDINGS: Motion degraded images.  Small right pleural effusion. Associated lower lobe atelectasis bilaterally.  Unenhanced liver, pancreas, and adrenal glands are within normal limits.  Numerous splenic granulomata.  Interval placement of a left percutaneous nephrostomy catheter with decompression of the prior left hydronephrosis. Bilateral renal cysts, poorly evaluated in the absence of intravenous contrast. Stable 10 mm proximal left ureteral calculus (series 2/ image 64).  Atherosclerotic calcifications of the abdominal aorta and branch vessels. Aorto bi-iliac stent graft.  Moderate abdominopelvic ascites, new.  Interval development of moderate free intraperitoneal gas. This would not be accounted for by the nephrostomy placement, which was retroperitoneal. In the absence of interval intraperitoneal procedure, this is worrisome for a perforated hollow viscus. Given the presence of inflammatory stranding (series 2/image 36) and localized gas near the gastric antrum (series 2/image 32), a perforated gastric ulcer is considered.  No suspicious abdominopelvic lymphadenopathy.  Prostate is unremarkable.  Mildly thick-walled bladder (Series 2/ image 99).  Mild degenerative changes of the visualized thoracolumbar spine.  IMPRESSION: Interval development of moderate free intraperitoneal gas, suggesting a perforated hollow viscus.  Although the exact etiology is unclear, a perforated gastric ulcer is considered.  Moderate abdominopelvic ascites, new.  Interval placement of a left percutaneous nephrostomy catheter with decompression of prior left hydronephrosis. Stable 10 mm proximal left ureteral calculus.  Mildly thick-walled bladder, correlate for cystitis.  Critical value/emergent results were called by telephone at the time of interpretation on 08/24/2013 at 12:40 PM to Dr.JESSICA [facility] , who verbally acknowledged these results.   Electronically Signed   By: Charline Bills M.D.   On: 08/24/2013 12:44   Dg Chest 1 View  08/24/2013   CLINICAL DATA:  Decreasing oxygen saturation, abdominal pain  EXAM: CHEST - 1 VIEW  COMPARISON:  08/18/2013; 08/17/2013; CT abdomen pelvis -08/24/2013  FINDINGS: Grossly unchanged borderline enlarged cardiac silhouette and mediastinal contours. Interval placement of enteric tube tip and side port projecting below the left hemidiaphragm. Interval removal of left jugular approach central venous catheter. Overall improved aeration of the lungs. There is persistent mild elevation of the right hemidiaphragm and minimal bibasilar opacities, right greater than left. No new focal airspace opacities. Trace right-sided effusion is not excluded. No pneumothorax. Unchanged bones.  IMPRESSION: 1. Overall improved aeration of the lungs suggests resolving edema and/or atelectasis. 2. Persistent mild elevation of the right hemidiaphragm with bibasilar opacities, right greater than left, likely atelectasis. No new focal airspace opacities. 3. Enteric tube tip and side port projects below the left hemidiaphragm.   Electronically Signed   By: Simonne Come M.D.   On: 08/24/2013 12:50   Portable Chest Xray  08/24/2013   CLINICAL DATA:  Endotracheal tube placement.  EXAM: PORTABLE CHEST - 1 VIEW  COMPARISON:  08/24/2013 at 1143 hr wound.  FINDINGS: Endotracheal tube is been placed with tip measuring 4.7 cm above the carinal.  Enteric tube remains in place without evidence of change. The tip is off the field of view. There is shallow inspiration with linear atelectasis developing in both lung bases. Probable small right pleural effusion. Normal heart size and pulmonary vascularity. No pneumothorax.  IMPRESSION: Endotracheal tube tip measures about 4.7 cm above the carina. Increasing atelectasis in the lung bases. Probable small right pleural effusion.   Electronically Signed   By: Burman Nieves M.D.   On: 08/24/2013 22:21    ASSESSMENT / PLAN:  67 yr old Smoker with free air, S/P laparotomy, ESBL in the urine complicated by Severe Hydronephrosis requiring nephrostomy tube , now on the Vent  Acute Respiratory Failure - s/p laparotomy  Plan: -full vent support with SBT with ABG after 30 minutes -f/u CXR  -PRN albuterol  ABD Pain Perforated Bowel s/p Laparotomy  Plan: -pain control with fentanyl -Rec's per CCS -PPI  ESBL UTI ESBL E-Coli Bacteremia Severe Sepsis - resolved.   Plan: -continue with imipenem -fluconazole for antifungal coverage -ID following- he should receive IV carbapenem therapy for 2 weeks MINIMUM beyond date of 11/24 AND until resolution of his renal abscess, which may take quite a bit longer. ie 4-6 weeks WITH repeat CT to assure it has resolved   Severe Hydronephrosis s/p Neprostomy Renal Calculi Renal Abscess Acute Renal Failure Hypernatremia Hyperchloremia  Plan: -See above -continue per drain -will d/c with perc neph drain & follow up with Urology in [place].  Family declines to return to [place]  -KVO NS -trend  BMP / Sr Cr, rising trend but K+ ok  Hypertension   Plan: -hold anti-hypertensive regimen post op  Anemia   Plan: -monitor H/H -SCD's for DVT proph  Canary Brim, NP-C [facility] Pgr: [phone] or [phone]  Extubate today.  Intubated post laparotomy, appears well.    CC time 35 min.  Patient seen and examined, agree with  above note.  I dictated the care and orders written for this patient under my direction.  Alyson Reedy, MD [phone]  08/25/2013, 8:10 AM

## 2013-08-26 ENCOUNTER — Inpatient Hospital Stay (HOSPITAL_COMMUNITY): Payer: Medicare Other

## 2013-08-26 DIAGNOSIS — N200 Calculus of kidney: Secondary | ICD-10-CM

## 2013-08-26 HISTORY — PX: OTHER SURGICAL HISTORY: SHX169

## 2013-08-26 LAB — BASIC METABOLIC PANEL
BUN: 95 mg/dL — ABNORMAL HIGH (ref 6–23)
Calcium: 7.7 mg/dL — ABNORMAL LOW (ref 8.4–10.5)
Chloride: 126 mEq/L — ABNORMAL HIGH (ref 96–112)
Creatinine, Ser: 4.07 mg/dL — ABNORMAL HIGH (ref 0.50–1.35)
GFR calc non Af Amer: 14 mL/min — ABNORMAL LOW (ref >90)
Glucose, Bld: 112 mg/dL — ABNORMAL HIGH (ref 70–99)
Sodium: 154 mEq/L — ABNORMAL HIGH (ref 135–145)

## 2013-08-26 LAB — CBC
HCT: 27.4 % — ABNORMAL LOW (ref 39.0–52.0)
Hemoglobin: 9.3 g/dL — ABNORMAL LOW (ref 13.0–17.0)
MCH: 29.5 pg (ref 26.0–34.0)
MCHC: 33.9 g/dL (ref 30.0–36.0)
MCV: 87 fL (ref 78.0–100.0)
Platelets: 328 10*3/uL (ref 150–400)
RDW: 18.4 % — ABNORMAL HIGH (ref 11.5–15.5)

## 2013-08-26 MED ORDER — FENTANYL CITRATE 0.05 MG/ML IJ SOLN
12.5000 ug | INTRAMUSCULAR | Status: DC | PRN
  Administered 2013-08-26 – 2013-08-31 (×7): 25 ug via INTRAVENOUS
  Administered 2013-09-01: 12.5 ug via INTRAVENOUS
  Filled 2013-08-26 (×8): qty 2

## 2013-08-26 MED ORDER — KCL IN DEXTROSE-NACL 20-5-0.45 MEQ/L-%-% IV SOLN
INTRAVENOUS | Status: DC
  Administered 2013-08-26: 10:00:00 via INTRAVENOUS
  Filled 2013-08-26 (×3): qty 1000

## 2013-08-26 MED ORDER — SODIUM CHLORIDE 0.9 % IJ SOLN: 3.0000 mL | INTRAMUSCULAR | Status: DC | PRN

## 2013-08-26 MED ORDER — DEXTROSE 5 % IV SOLN
INTRAVENOUS | Status: DC
  Administered 2013-08-26: 22:00:00 via INTRAVENOUS
  Administered 2013-08-26: 100 mL via INTRAVENOUS
  Administered 2013-08-27 – 2013-08-29 (×5): via INTRAVENOUS

## 2013-08-26 MED ORDER — ALBUTEROL SULFATE (5 MG/ML) 0.5% IN NEBU: 2.5000 mg | INHALATION_SOLUTION | RESPIRATORY_TRACT | Status: DC | PRN

## 2013-08-26 MED ORDER — ENOXAPARIN SODIUM 30 MG/0.3ML ~~LOC~~ SOLN
30.0000 mg | SUBCUTANEOUS | Status: DC
  Administered 2013-08-26 – 2013-08-28 (×3): 30 mg via SUBCUTANEOUS
  Filled 2013-08-26 (×6): qty 0.3

## 2013-08-26 MED ORDER — SODIUM CHLORIDE 0.9 % IJ SOLN
10.0000 mL | INTRAMUSCULAR | Status: DC | PRN
  Administered 2013-08-26: 10 mL via INTRAVENOUS

## 2013-08-26 NOTE — Progress Notes (Signed)
2 Days Post-Op  Subjective: Pt sore, but pain well controlled.  No N/V.  Very thirsty.  No bowel function yet.  No flatus or BM's.  Mobilized to the chair, but hasn't been through the halls yet.    Objective: Vital signs in last 24 hours: Temp:  [97.4 F (36.3 C)-99.6 F (37.6 C)] 98.2 F (36.8 C) (12/01 0437) Pulse Rate:  [99-115] 103 (12/01 0700) Resp:  [12-26] 18 (12/01 0700) BP: (84-130)/(44-68) 121/66 mmHg (12/01 0700) SpO2:  [92 %-99 %] 97 % (12/01 0700) FiO2 (%):  [40 %] 40 % (11/30 0800) Last BM Date: 08/22/13  Intake/Output from previous day: 11/30 0701 - 12/01 0700 In: 3805 [I.V.:3000; NG/GT:30; IV Piggyback:600] Out: 3725 [Urine:2730; Emesis/NG output:500; Drains:495] Intake/Output this shift:    PE: Gen:  Alert, NAD, pleasant Abd: Soft, mild distension, incision is TTP, diminished BS, no HSM, midline skin wound clean, drains with serosanguinous drainage   Lab Results:   Recent Labs  08/25/13 0404 08/26/13 0421  WBC 26.6* 15.6*  HGB 9.4* 9.3*  HCT 27.5* 27.4*  PLT 321 328   BMET  Recent Labs  08/25/13 0404 08/26/13 0421  NA 148* 154*  K 4.9 4.5  CL 119* 126*  CO2 14* 14*  GLUCOSE 99 112*  BUN 102* 95*  CREATININE 4.59* 4.07*  CALCIUM 7.4* 7.7*   PT/INR No results found for this basename: LABPROT, INR,  in the last 72 hours CMP     Component Value Date/Time   NA 154* 08/26/2013 0421   K 4.5 08/26/2013 0421   CL 126* 08/26/2013 0421   CO2 14* 08/26/2013 0421   GLUCOSE 112* 08/26/2013 0421   BUN 95* 08/26/2013 0421   CREATININE 4.07* 08/26/2013 0421   CALCIUM 7.7* 08/26/2013 0421   PROT 5.7* 08/19/2013 0500   ALBUMIN 1.5* 08/19/2013 0500   AST 53* 08/19/2013 0500   ALT 47 08/19/2013 0500   ALKPHOS 312* 08/19/2013 0500   BILITOT 1.1 08/19/2013 0500   GFRNONAA 14* 08/26/2013 0421   GFRAA 16* 08/26/2013 0421   Lipase  No results found for this basename: lipase       Studies/Results: Ct Abdomen Pelvis Wo Contrast  08/24/2013   CLINICAL  DATA:  Abdominal pain, sepsis, leukocytosis. "Coffee-ground" fluid draining from NG tube.  EXAM: CT ABDOMEN AND PELVIS WITHOUT CONTRAST  TECHNIQUE: Multidetector CT imaging of the abdomen and pelvis was performed following the standard protocol without intravenous contrast.  COMPARISON:  [facility] CT abdomen pelvis dated 08/18/2013  FINDINGS: Motion degraded images.  Small right pleural effusion. Associated lower lobe atelectasis bilaterally.  Unenhanced liver, pancreas, and adrenal glands are within normal limits.  Numerous splenic granulomata.  Interval placement of a left percutaneous nephrostomy catheter with decompression of the prior left hydronephrosis. Bilateral renal cysts, poorly evaluated in the absence of intravenous contrast. Stable 10 mm proximal left ureteral calculus (series 2/ image 64).  Atherosclerotic calcifications of the abdominal aorta and branch vessels. Aorto bi-iliac stent graft.  Moderate abdominopelvic ascites, new.  Interval development of moderate free intraperitoneal gas. This would not be accounted for by the nephrostomy placement, which was retroperitoneal. In the absence of interval intraperitoneal procedure, this is worrisome for a perforated hollow viscus. Given the presence of inflammatory stranding (series 2/image 36) and localized gas near the gastric antrum (series 2/image 32), a perforated gastric ulcer is considered.  No suspicious abdominopelvic lymphadenopathy.  Prostate is unremarkable.  Mildly thick-walled bladder (Series 2/ image 99).  Mild degenerative changes of the visualized  thoracolumbar spine.  IMPRESSION: Interval development of moderate free intraperitoneal gas, suggesting a perforated hollow viscus. Although the exact etiology is unclear, a perforated gastric ulcer is considered.  Moderate abdominopelvic ascites, new.  Interval placement of a left percutaneous nephrostomy catheter with decompression of prior left hydronephrosis. Stable 10 mm proximal left ureteral  calculus.  Mildly thick-walled bladder, correlate for cystitis.  Critical value/emergent results were called by telephone at the time of interpretation on 08/24/2013 at 12:40 PM to Dr.JESSICA [facility] , who verbally acknowledged these results.   Electronically Signed   By: Charline Bills M.D.   On: 08/24/2013 12:44   Dg Chest 1 View  08/24/2013   CLINICAL DATA:  Decreasing oxygen saturation, abdominal pain  EXAM: CHEST - 1 VIEW  COMPARISON:  08/18/2013; 08/17/2013; CT abdomen pelvis -08/24/2013  FINDINGS: Grossly unchanged borderline enlarged cardiac silhouette and mediastinal contours. Interval placement of enteric tube tip and side port projecting below the left hemidiaphragm. Interval removal of left jugular approach central venous catheter. Overall improved aeration of the lungs. There is persistent mild elevation of the right hemidiaphragm and minimal bibasilar opacities, right greater than left. No new focal airspace opacities. Trace right-sided effusion is not excluded. No pneumothorax. Unchanged bones.  IMPRESSION: 1. Overall improved aeration of the lungs suggests resolving edema and/or atelectasis. 2. Persistent mild elevation of the right hemidiaphragm with bibasilar opacities, right greater than left, likely atelectasis. No new focal airspace opacities. 3. Enteric tube tip and side port projects below the left hemidiaphragm.   Electronically Signed   By: Simonne Come M.D.   On: 08/24/2013 12:50   Portable Chest Xray  08/24/2013   CLINICAL DATA:  Endotracheal tube placement.  EXAM: PORTABLE CHEST - 1 VIEW  COMPARISON:  08/24/2013 at 1143 hr wound.  FINDINGS: Endotracheal tube is been placed with tip measuring 4.7 cm above the carinal. Enteric tube remains in place without evidence of change. The tip is off the field of view. There is shallow inspiration with linear atelectasis developing in both lung bases. Probable small right pleural effusion. Normal heart size and pulmonary vascularity. No  pneumothorax.  IMPRESSION: Endotracheal tube tip measures about 4.7 cm above the carina. Increasing atelectasis in the lung bases. Probable small right pleural effusion.   Electronically Signed   By: Burman Nieves M.D.   On: 08/24/2013 22:21    Anti-infectives: Anti-infectives   Start     Dose/Rate Route Frequency Ordered Stop   08/26/13 1400  vancomycin (VANCOCIN) 1,500 mg in sodium chloride 0.9 % 500 mL IVPB  Status:  Discontinued     1,500 mg 250 mL/hr over 120 Minutes Intravenous Every 48 hours 08/24/13 1301 08/24/13 2004   08/25/13 1800  imipenem-cilastatin (PRIMAXIN) 250 mg in sodium chloride 0.9 % 100 mL IVPB     250 mg 200 mL/hr over 30 Minutes Intravenous Every 12 hours 08/24/13 1245     08/24/13 2345  fluconazole (DIFLUCAN) IVPB 200 mg     200 mg 100 mL/hr over 60 Minutes Intravenous Every 24 hours 08/24/13 2332     08/24/13 1315  vancomycin (VANCOCIN) 2,250 mg in sodium chloride 0.9 % 500 mL IVPB  Status:  Discontinued     2,250 mg 250 mL/hr over 120 Minutes Intravenous  Once 08/24/13 1301 08/24/13 2004   08/20/13 1200  imipenem-cilastatin (PRIMAXIN) 250 mg in sodium chloride 0.9 % 100 mL IVPB  Status:  Discontinued     250 mg 200 mL/hr over 30 Minutes Intravenous 4 times per day  08/20/13 1132 08/24/13 1245   08/18/13 2200  ceFEPIme (MAXIPIME) 1 g in dextrose 5 % 50 mL IVPB  Status:  Discontinued     1 g 100 mL/hr over 30 Minutes Intravenous Every 12 hours 08/18/13 1846 08/18/13 1847   08/18/13 2000  ceFEPIme (MAXIPIME) 1 g in dextrose 5 % 50 mL IVPB  Status:  Discontinued     1 g 100 mL/hr over 30 Minutes Intravenous Every 24 hours 08/18/13 1728 08/18/13 1846   08/18/13 1900  ceFEPIme (MAXIPIME) 1 g in dextrose 5 % 50 mL IVPB  Status:  Discontinued     1 g 100 mL/hr over 30 Minutes Intravenous Every 24 hours 08/18/13 1847 08/20/13 1132       Assessment/Plan Perforated duodenal ulcerwith free air - POD #2 s/p laparotomy and graham patch closure of perforated duodenal  ulcer and placement of JP drains Hydronephrosis (left)/renal abscess s/p perc nephro tube  Bacteremia (ESBL ecoli) and severe sepsis for UTI ARF on chronic renal failure PVD HTN Leukocytosis - significantly improved to 15.6 ABL Anemia - stable  Plan: 1.  Cont invanx and diflucan 2.  Cont NG suction, no bowel function yet, d/c foley catheter 3.  Ambulate OOB and IS 4.  SCD's, start lovenox, and cont PPI 5.  Recommend urology to be consulted 6.  Prior to removing NG and advancing diet will need UGI to check for patch leak - maybe tomorrow? 7.  Disp - Okay to transfer to the floor (preference would be 6N) from surgical perspective when medically stable    LOS: 8 days    DORT, Lawson Mahone 08/26/2013, 7:30 AM Pager: [phone]

## 2013-08-26 NOTE — Progress Notes (Signed)
PULMONARY  / CRITICAL CARE MEDICINE  Name: Nathan Rivera MRN: [id number] DOB: November 29, 1945    ADMISSION DATE:  08/18/2013 CONSULTATION DATE: 08/24/2013  REFERRING MD :  SICU PRIMARY SERVICE:  SICU  CHIEF COMPLAINT:  Post Laparotomy   BRIEF PATIENT DESCRIPTION: 67 y/o admitted for ESBL E-Coli Sepsis, hydro s/p perc neph.  Found to have free air / bowel perf s/p laparotomy.  Returned to ICU on vent.    SIGNIFICANT EVENTS / STUDIES:  11/23 - Post left percutaneous nephrostomy.  11/24 - Left IJ CVC,  d/c 11/25    LINES / TUBES: ETT 11/29>> 11/30  CULTURES: 11/23 blood culture>>> ESBL positive E. Coli (2/2) from   11/23 urine culture>>>E-Coli>>>sens imipenem, nitrofurantoin   ANTIBIOTICS: Cefepime 11/23 >>> 11/25  Primaxin 11/25 >>>    SUBJECTIVE:  No distress. No complaints. Cognition intact  VITAL SIGNS: Temp:  [97.4 F (36.3 C)-98.6 F (37 C)] 98.2 F (36.8 C) (12/01 1230) Pulse Rate:  [99-107] 102 (12/01 1230) Resp:  [12-20] 18 (12/01 1230) BP: (101-133)/(45-74) 133/74 mmHg (12/01 1230) SpO2:  [92 %-100 %] 99 % (12/01 1230)  PHYSICAL EXAMINATION:  General: NAD HEENT: NGT noted, otherwise WNL Heart: RRR s M Lungs: CTA  Abd: soft, NT, diminished BS Ext: warm, without edema    I have reviewed all of today's lab results. Relevant abnormalities are discussed in the A/P section  CXR: bilateral atx  ASSESSMENT / PLAN: Acute Respiratory Failure, resolved Smoker, no prior hx of COPD Perforated Bowel s/p Laparotomy L Hydronephrosis due to obstructing stone  s/p Nephrostomy ESBL UTI ESBL E-Coli Bacteremia Severe Sepsis - resolved.  Acute Renal Failure, nonoliguric. Cr improving Hypernatremia H/O hypertension  Anemia without acute blood loss  Counseled re: this opportunity to quit smoking Post op mgmt per CCS Mgmt of L nephrostomy tube per IR Mgmt of abx per ID Monitor renal function and correct electrolytes   IVFs changed to D5W 12/1 Holding  anti-hypertensive meds  monitor CBC intermittently Transfer to med-surg floor TRH to resume care as of 12/2 and PCCM to sign off  Discussed with CCS PA.  Discussed with Dr Nichola Sizer, MD ; [facility] [phone].  After 5:30 PM or weekends, call [phone]

## 2013-08-26 NOTE — Progress Notes (Signed)
Agree with A&P of MD,PA. He appears comfortable. Na+ is 154 so will change IVF to D5 1/2 NS with KCl

## 2013-08-26 NOTE — Plan of Care (Signed)
Problem: Phase I Progression Outcomes Goal: Sutures/staples intact Outcome: Not Applicable Date Met:  08/26/13 Skin open-packing wet/dry dressing. Goal: Initial discharge plan identified Outcome: Completed/Met Date Met:  08/26/13 To be discharge home with wife.  Will likely need HHN followup for discharge. Goal: Voiding-avoid urinary catheter unless indicated Outcome: Progressing Foley removed @ 1015 08/26/13

## 2013-08-26 NOTE — Progress Notes (Signed)
INFECTIOUS DISEASE PROGRESS NOTE  ID: Nathan Rivera is a 67 y.o. male with  Principal Problem:   Severe sepsis Active Problems:   PVD (peripheral vascular disease)   Hypertension   Acute renal failure   Hydronephrosis, left   Renal abscess   Acute respiratory failure with hypoxia   Dehydration   Bacteremia due to ESBL Escherichia coli  Subjective: Without complaints, would like water.   Abtx:  Anti-infectives   Start     Dose/Rate Route Frequency Ordered Stop   08/26/13 1400  vancomycin (VANCOCIN) 1,500 mg in sodium chloride 0.9 % 500 mL IVPB  Status:  Discontinued     1,500 mg 250 mL/hr over 120 Minutes Intravenous Every 48 hours 08/24/13 1301 08/24/13 2004   08/25/13 1800  imipenem-cilastatin (PRIMAXIN) 250 mg in sodium chloride 0.9 % 100 mL IVPB     250 mg 200 mL/hr over 30 Minutes Intravenous Every 12 hours 08/24/13 1245     08/24/13 2345  fluconazole (DIFLUCAN) IVPB 200 mg     200 mg 100 mL/hr over 60 Minutes Intravenous Every 24 hours 08/24/13 2332     08/24/13 1315  vancomycin (VANCOCIN) 2,250 mg in sodium chloride 0.9 % 500 mL IVPB  Status:  Discontinued     2,250 mg 250 mL/hr over 120 Minutes Intravenous  Once 08/24/13 1301 08/24/13 2004   08/20/13 1200  imipenem-cilastatin (PRIMAXIN) 250 mg in sodium chloride 0.9 % 100 mL IVPB  Status:  Discontinued     250 mg 200 mL/hr over 30 Minutes Intravenous 4 times per day 08/20/13 1132 08/24/13 1245   08/18/13 2200  ceFEPIme (MAXIPIME) 1 g in dextrose 5 % 50 mL IVPB  Status:  Discontinued     1 g 100 mL/hr over 30 Minutes Intravenous Every 12 hours 08/18/13 1846 08/18/13 1847   08/18/13 2000  ceFEPIme (MAXIPIME) 1 g in dextrose 5 % 50 mL IVPB  Status:  Discontinued     1 g 100 mL/hr over 30 Minutes Intravenous Every 24 hours 08/18/13 1728 08/18/13 1846   08/18/13 1900  ceFEPIme (MAXIPIME) 1 g in dextrose 5 % 50 mL IVPB  Status:  Discontinued     1 g 100 mL/hr over 30 Minutes Intravenous Every 24 hours 08/18/13 1847  08/20/13 1132      Medications:  Scheduled: . antiseptic oral rinse  15 mL Mouth Rinse QID  . enoxaparin (LOVENOX) injection  30 mg Subcutaneous Q24H  . fluconazole (DIFLUCAN) IV  200 mg Intravenous Q24H  . imipenem-cilastatin  250 mg Intravenous Q12H  . pantoprazole (PROTONIX) IV  40 mg Intravenous Q12H    Objective: Vital signs in last 24 hours: Temp:  [97.4 F (36.3 C)-99.1 F (37.3 C)] 97.4 F (36.3 C) (12/01 0821) Pulse Rate:  [99-115] 100 (12/01 1000) Resp:  [12-26] 18 (12/01 1000) BP: (101-130)/(45-74) 129/64 mmHg (12/01 1000) SpO2:  [92 %-100 %] 99 % (12/01 1000)   General appearance: alert, cooperative and no distress Resp: clear to auscultation bilaterally Cardio: regular rate and rhythm GI: abnormal findings:  absent bowel sounds and clean dressings, 2 drains in place. non-tender.   Lab Results  Recent Labs  08/25/13 0404 08/26/13 0421  WBC 26.6* 15.6*  HGB 9.4* 9.3*  HCT 27.5* 27.4*  NA 148* 154*  K 4.9 4.5  CL 119* 126*  CO2 14* 14*  BUN 102* 95*  CREATININE 4.59* 4.07*   Liver Panel No results found for this basename: PROT, ALBUMIN, AST, ALT, ALKPHOS, BILITOT, BILIDIR, IBILI,  in the last 72 hours Sedimentation Rate No results found for this basename: ESRSEDRATE,  in the last 72 hours C-Reactive Protein No results found for this basename: CRP,  in the last 72 hours  Microbiology: Recent Results (from the past 240 hour(s))  MRSA PCR SCREENING     Status: None   Collection Time    08/18/13  4:18 PM      Result Value Range Status   MRSA by PCR NEGATIVE  NEGATIVE Final   Comment:            The GeneXpert MRSA Assay (FDA     approved for NASAL specimens     only), is one component of a     comprehensive MRSA colonization     surveillance program. It is not     intended to diagnose MRSA     infection nor to guide or     monitor treatment for     MRSA infections.  URINE CULTURE     Status: None   Collection Time    08/18/13  5:13 PM       Result Value Range Status   Specimen Description URINE, CLEAN CATCH   Final   Special Requests NONE   Final   Culture  Setup Time     Final   Value: 08/19/2013 00:11     Performed at [facility] Count     Final   Value: 70,000 COLONIES/ML     Performed at [facility]   Culture     Final   Value: ESCHERICHIA COLI     Note: Confirmed Extended Spectrum Beta-Lactamase Producer (ESBL) CEFOXITIN SENSITIVE <=4     Performed at [facility]   Report Status 08/21/2013 FINAL   Final   Organism ID, Bacteria ESCHERICHIA COLI   Final  CULTURE, ROUTINE-ABSCESS     Status: None   Collection Time    08/18/13  6:49 PM      Result Value Range Status   Specimen Description ABSCESS LEFT KIDNEY   Final   Special Requests NONE   Final   Gram Stain     Final   Value: ABUNDANT WBC PRESENT,BOTH PMN AND MONONUCLEAR     NO SQUAMOUS EPITHELIAL CELLS SEEN     MODERATE GRAM NEGATIVE RODS     Performed at [facility]   Culture     Final   Value: ABUNDANT ESCHERICHIA COLI     Note: Confirmed Extended Spectrum Beta-Lactamase Producer (ESBL) CRITICAL RESULT CALLED TO, READ BACK BY AND VERIFIED WITH: ALBERT R 08/21/13 AT 810 BY [facility]     Performed at [facility]   Report Status 08/21/2013 FINAL   Final   Organism ID, Bacteria ESCHERICHIA COLI   Final  ANAEROBIC CULTURE     Status: None   Collection Time    08/18/13  6:54 PM      Result Value Range Status   Specimen Description ABSCESS LEFT KIDNEY   Final   Special Requests NONE   Final   Gram Stain     Final   Value: ABUNDANT WBC PRESENT,BOTH PMN AND MONONUCLEAR     NO SQUAMOUS EPITHELIAL CELLS SEEN     MODERATE GRAM NEGATIVE RODS     Performed at [facility]   Culture     Final   Value: NO ANAEROBES ISOLATED     Performed at [facility]   Report Status 08/23/2013 FINAL  Final  CULTURE, BLOOD (ROUTINE X 2)     Status: None   Collection Time    08/18/13  7:05 PM      Result Value  Range Status   Specimen Description BLOOD RIGHT HAND   Final   Special Requests BOTTLES DRAWN AEROBIC AND ANAEROBIC 10CC   Final   Culture  Setup Time     Final   Value: 08/19/2013 00:10     Performed at [facility]   Culture     Final   Value: NO GROWTH 5 DAYS     Performed at [facility]   Report Status 08/24/2013 FINAL   Final  CULTURE, BLOOD (ROUTINE X 2)     Status: None   Collection Time    08/18/13  9:45 PM      Result Value Range Status   Specimen Description BLOOD LEFT HAND   Final   Special Requests BOTTLES DRAWN AEROBIC AND ANAEROBIC 10CC EA   Final   Culture  Setup Time     Final   Value: 08/19/2013 02:54     Performed at [facility]   Culture     Final   Value: NO GROWTH 5 DAYS     Performed at [facility]   Report Status 08/25/2013 FINAL   Final  CULTURE, BLOOD (ROUTINE X 2)     Status: None   Collection Time    08/20/13  4:40 PM      Result Value Range Status   Specimen Description BLOOD LEFT ARM   Final   Special Requests BOTTLES DRAWN AEROBIC ONLY 3CC   Final   Culture  Setup Time     Final   Value: 08/21/2013 02:13     Performed at [facility]   Culture     Final   Value:        BLOOD CULTURE RECEIVED NO GROWTH TO DATE CULTURE WILL BE HELD FOR 5 DAYS BEFORE ISSUING A FINAL NEGATIVE REPORT     Performed at [facility]   Report Status PENDING   Incomplete  CULTURE, BLOOD (ROUTINE X 2)     Status: None   Collection Time    08/20/13  4:55 PM      Result Value Range Status   Specimen Description BLOOD RIGHT ARM   Final   Special Requests BOTTLES DRAWN AEROBIC ONLY 10CC   Final   Culture  Setup Time     Final   Value: 08/21/2013 02:13     Performed at [facility]   Culture     Final   Value:        BLOOD CULTURE RECEIVED NO GROWTH TO DATE CULTURE WILL BE HELD FOR 5 DAYS BEFORE ISSUING A FINAL NEGATIVE REPORT     Performed at [facility]   Report Status PENDING   Incomplete  MRSA PCR  SCREENING     Status: None   Collection Time    08/24/13  8:07 PM      Result Value Range Status   MRSA by PCR NEGATIVE  NEGATIVE Final   Comment:            The GeneXpert MRSA Assay (FDA     approved for NASAL specimens     only), is one component of a     comprehensive MRSA colonization     surveillance program. It is not     intended to diagnose MRSA  infection nor to guide or     monitor treatment for     MRSA infections.  BODY FLUID CULTURE     Status: None   Collection Time    08/25/13  3:36 PM      Result Value Range Status   Specimen Description PERITONEAL CAVITY   Final   Special Requests Normal   Final   Gram Stain     Final   Value: ABUNDANT WBC PRESENT,BOTH PMN AND MONONUCLEAR     NO ORGANISMS SEEN     Performed at [facility]   Culture     Final   Value: NO GROWTH     Performed at [facility]   Report Status PENDING   Incomplete    Studies/Results: Ct Abdomen Pelvis Wo Contrast  08/24/2013   CLINICAL DATA:  Abdominal pain, sepsis, leukocytosis. "Coffee-ground" fluid draining from NG tube.  EXAM: CT ABDOMEN AND PELVIS WITHOUT CONTRAST  TECHNIQUE: Multidetector CT imaging of the abdomen and pelvis was performed following the standard protocol without intravenous contrast.  COMPARISON:  [facility] CT abdomen pelvis dated 08/18/2013  FINDINGS: Motion degraded images.  Small right pleural effusion. Associated lower lobe atelectasis bilaterally.  Unenhanced liver, pancreas, and adrenal glands are within normal limits.  Numerous splenic granulomata.  Interval placement of a left percutaneous nephrostomy catheter with decompression of the prior left hydronephrosis. Bilateral renal cysts, poorly evaluated in the absence of intravenous contrast. Stable 10 mm proximal left ureteral calculus (series 2/ image 64).  Atherosclerotic calcifications of the abdominal aorta and branch vessels. Aorto bi-iliac stent graft.  Moderate abdominopelvic ascites, new.  Interval  development of moderate free intraperitoneal gas. This would not be accounted for by the nephrostomy placement, which was retroperitoneal. In the absence of interval intraperitoneal procedure, this is worrisome for a perforated hollow viscus. Given the presence of inflammatory stranding (series 2/image 36) and localized gas near the gastric antrum (series 2/image 32), a perforated gastric ulcer is considered.  No suspicious abdominopelvic lymphadenopathy.  Prostate is unremarkable.  Mildly thick-walled bladder (Series 2/ image 99).  Mild degenerative changes of the visualized thoracolumbar spine.  IMPRESSION: Interval development of moderate free intraperitoneal gas, suggesting a perforated hollow viscus. Although the exact etiology is unclear, a perforated gastric ulcer is considered.  Moderate abdominopelvic ascites, new.  Interval placement of a left percutaneous nephrostomy catheter with decompression of prior left hydronephrosis. Stable 10 mm proximal left ureteral calculus.  Mildly thick-walled bladder, correlate for cystitis.  Critical value/emergent results were called by telephone at the time of interpretation on 08/24/2013 at 12:40 PM to Dr.JESSICA [facility] , who verbally acknowledged these results.   Electronically Signed   By: Charline Bills M.D.   On: 08/24/2013 12:44   Dg Chest 1 View  08/24/2013   CLINICAL DATA:  Decreasing oxygen saturation, abdominal pain  EXAM: CHEST - 1 VIEW  COMPARISON:  08/18/2013; 08/17/2013; CT abdomen pelvis -08/24/2013  FINDINGS: Grossly unchanged borderline enlarged cardiac silhouette and mediastinal contours. Interval placement of enteric tube tip and side port projecting below the left hemidiaphragm. Interval removal of left jugular approach central venous catheter. Overall improved aeration of the lungs. There is persistent mild elevation of the right hemidiaphragm and minimal bibasilar opacities, right greater than left. No new focal airspace opacities. Trace  right-sided effusion is not excluded. No pneumothorax. Unchanged bones.  IMPRESSION: 1. Overall improved aeration of the lungs suggests resolving edema and/or atelectasis. 2. Persistent mild elevation of the right hemidiaphragm with bibasilar opacities,  right greater than left, likely atelectasis. No new focal airspace opacities. 3. Enteric tube tip and side port projects below the left hemidiaphragm.   Electronically Signed   By: Simonne Come M.D.   On: 08/24/2013 12:50   Dg Chest Port 1 View  08/26/2013   CLINICAL DATA:  Endotracheal tube removal  EXAM: PORTABLE CHEST - 1 VIEW  COMPARISON:  08/24/2013, 08/18/2013  FINDINGS: There has been interval removal of the endotracheal tube. There is a nasogastric tube in unchanged position. There is elevation of the right diaphragm. There is left basilar atelectasis versus scarring. There is no focal consolidation, pleural effusion or pneumothorax. Normal cardiomediastinal silhouette. The osseous structures are unremarkable.  IMPRESSION: Interval removal of the endotracheal tube. Otherwise no significant interval change.   Electronically Signed   By: Elige Ko   On: 08/26/2013 08:15   Portable Chest Xray  08/24/2013   CLINICAL DATA:  Endotracheal tube placement.  EXAM: PORTABLE CHEST - 1 VIEW  COMPARISON:  08/24/2013 at 1143 hr wound.  FINDINGS: Endotracheal tube is been placed with tip measuring 4.7 cm above the carinal. Enteric tube remains in place without evidence of change. The tip is off the field of view. There is shallow inspiration with linear atelectasis developing in both lung bases. Probable small right pleural effusion. Normal heart size and pulmonary vascularity. No pneumothorax.  IMPRESSION: Endotracheal tube tip measures about 4.7 cm above the carina. Increasing atelectasis in the lung bases. Probable small right pleural effusion.   Electronically Signed   By: Burman Nieves M.D.   On: 08/24/2013 22:21     Assessment/Plan: Perforated duodenal  ulcer Sepsis Renal Abscess, ESBL E coli Renal stone  Peritoneal fluid Cx pending Leukocytosis improving, afebrile  No change in anbx for now.  Suggested to wife that he may need to wait for stone extraction.   Total days of antibiotics: 9 (imipenem/diflucan day 3)         Johny Sax Infectious Diseases (pager) [phone] [email or web address] 08/26/2013, 10:57 AM  LOS: 8 days

## 2013-08-26 NOTE — Progress Notes (Signed)
UR Completed.  Nathan Rivera Jane [phone] 10/16/2012  

## 2013-08-27 ENCOUNTER — Encounter (HOSPITAL_COMMUNITY): Payer: Self-pay | Admitting: General Surgery

## 2013-08-27 DIAGNOSIS — A419 Sepsis, unspecified organism: Secondary | ICD-10-CM

## 2013-08-27 DIAGNOSIS — N179 Acute kidney failure, unspecified: Secondary | ICD-10-CM

## 2013-08-27 DIAGNOSIS — E860 Dehydration: Secondary | ICD-10-CM

## 2013-08-27 DIAGNOSIS — B9689 Other specified bacterial agents as the cause of diseases classified elsewhere: Secondary | ICD-10-CM

## 2013-08-27 DIAGNOSIS — R7881 Bacteremia: Secondary | ICD-10-CM

## 2013-08-27 DIAGNOSIS — N1330 Unspecified hydronephrosis: Secondary | ICD-10-CM

## 2013-08-27 DIAGNOSIS — N201 Calculus of ureter: Secondary | ICD-10-CM

## 2013-08-27 DIAGNOSIS — Z1619 Resistance to other specified beta lactam antibiotics: Secondary | ICD-10-CM

## 2013-08-27 DIAGNOSIS — J9600 Acute respiratory failure, unspecified whether with hypoxia or hypercapnia: Secondary | ICD-10-CM

## 2013-08-27 DIAGNOSIS — R188 Other ascites: Secondary | ICD-10-CM

## 2013-08-27 DIAGNOSIS — K265 Chronic or unspecified duodenal ulcer with perforation: Secondary | ICD-10-CM

## 2013-08-27 DIAGNOSIS — A498 Other bacterial infections of unspecified site: Secondary | ICD-10-CM

## 2013-08-27 LAB — CULTURE, BLOOD (ROUTINE X 2): Culture: NO GROWTH

## 2013-08-27 MED ORDER — IMIPENEM-CILASTATIN 250 MG IV SOLR
250.0000 mg | Freq: Four times a day (QID) | INTRAVENOUS | Status: DC
  Administered 2013-08-27 – 2013-08-29 (×9): 250 mg via INTRAVENOUS
  Filled 2013-08-27 (×15): qty 250

## 2013-08-27 NOTE — Progress Notes (Signed)
3 Days Post-Op  Subjective: Pt passing flatus.  Pain with cough or sneezing.  No bm yet.    Objective: Vital signs in last 24 hours: Temp:  [98.1 F (36.7 C)-99.3 F (37.4 C)] 98.1 F (36.7 C) (12/02 0500) Pulse Rate:  [91-102] 95 (12/02 0500) Resp:  [16-20] 20 (12/02 0500) BP: (129-145)/(64-77) 145/74 mmHg (12/02 0500) SpO2:  [93 %-99 %] 97 % (12/02 0500) Last BM Date: 08/18/13  Intake/Output from previous day: 12/01 0701 - 12/02 0700 In: 2710 [I.V.:2410; IV Piggyback:300] Out: 5750 [Urine:4040; Emesis/NG output:1250; Drains:460] Intake/Output this shift: Total I/O In: -  Out: 270 [Urine:200; Drains:70]   PE:  Gen: Alert, NAD, pleasant  Abd: Soft, non distended, incision is TTP, bowel sounds are present, no HSM, midline skin wound clean, RLQ drain with serosanguinous drainage.  LLQ drain with serous output    Lab Results:   Recent Labs  08/25/13 0404 08/26/13 0421  WBC 26.6* 15.6*  HGB 9.4* 9.3*  HCT 27.5* 27.4*  PLT 321 328   BMET  Recent Labs  08/25/13 0404 08/26/13 0421  NA 148* 154*  K 4.9 4.5  CL 119* 126*  CO2 14* 14*  GLUCOSE 99 112*  BUN 102* 95*  CREATININE 4.59* 4.07*  CALCIUM 7.4* 7.7*   PT/INR No results found for this basename: LABPROT, INR,  in the last 72 hours ABG  Recent Labs  08/24/13 1850 08/24/13 2337  PHART 7.164* 7.273*  HCO3 13.8* 13.4*    Studies/Results: Dg Chest Port 1 View  08/26/2013   CLINICAL DATA:  Endotracheal tube removal  EXAM: PORTABLE CHEST - 1 VIEW  COMPARISON:  08/24/2013, 08/18/2013  FINDINGS: There has been interval removal of the endotracheal tube. There is a nasogastric tube in unchanged position. There is elevation of the right diaphragm. There is left basilar atelectasis versus scarring. There is no focal consolidation, pleural effusion or pneumothorax. Normal cardiomediastinal silhouette. The osseous structures are unremarkable.  IMPRESSION: Interval removal of the endotracheal tube. Otherwise no  significant interval change.   Electronically Signed   By: Elige Ko   On: 08/26/2013 08:15    Anti-infectives: Anti-infectives   Start     Dose/Rate Route Frequency Ordered Stop   08/27/13 0200  imipenem-cilastatin (PRIMAXIN) 250 mg in sodium chloride 0.9 % 100 mL IVPB     250 mg 200 mL/hr over 30 Minutes Intravenous Every 6 hours 08/27/13 0051     08/26/13 1400  vancomycin (VANCOCIN) 1,500 mg in sodium chloride 0.9 % 500 mL IVPB  Status:  Discontinued     1,500 mg 250 mL/hr over 120 Minutes Intravenous Every 48 hours 08/24/13 1301 08/24/13 2004   08/25/13 1800  imipenem-cilastatin (PRIMAXIN) 250 mg in sodium chloride 0.9 % 100 mL IVPB  Status:  Discontinued     250 mg 200 mL/hr over 30 Minutes Intravenous Every 12 hours 08/24/13 1245 08/27/13 0051   08/24/13 2345  fluconazole (DIFLUCAN) IVPB 200 mg     200 mg 100 mL/hr over 60 Minutes Intravenous Every 24 hours 08/24/13 2332     08/24/13 1315  vancomycin (VANCOCIN) 2,250 mg in sodium chloride 0.9 % 500 mL IVPB  Status:  Discontinued     2,250 mg 250 mL/hr over 120 Minutes Intravenous  Once 08/24/13 1301 08/24/13 2004   08/20/13 1200  imipenem-cilastatin (PRIMAXIN) 250 mg in sodium chloride 0.9 % 100 mL IVPB  Status:  Discontinued     250 mg 200 mL/hr over 30 Minutes Intravenous 4 times per day  08/20/13 1132 08/24/13 1245   08/18/13 2200  ceFEPIme (MAXIPIME) 1 g in dextrose 5 % 50 mL IVPB  Status:  Discontinued     1 g 100 mL/hr over 30 Minutes Intravenous Every 12 hours 08/18/13 1846 08/18/13 1847   08/18/13 2000  ceFEPIme (MAXIPIME) 1 g in dextrose 5 % 50 mL IVPB  Status:  Discontinued     1 g 100 mL/hr over 30 Minutes Intravenous Every 24 hours 08/18/13 1728 08/18/13 1846   08/18/13 1900  ceFEPIme (MAXIPIME) 1 g in dextrose 5 % 50 mL IVPB  Status:  Discontinued     1 g 100 mL/hr over 30 Minutes Intravenous Every 24 hours 08/18/13 1847 08/20/13 1132      Assessment/Plan: Perforated duodenal ulcerwith free air - POD #3 s/p  laparotomy and graham patch closure of perforated duodenal ulcer and placement of JP drains  Hydronephrosis (left)/renal abscess s/p perc nephro tube  Bacteremia (ESBL ecoli) and severe sepsis for UTI  ARF on chronic renal failure  PVD  HTN  Leukocytosis ABL Anemia - stable  Plan:  1. Cont primaxin and diflucan, recommend 7 days of therapy 2. Cont NGT to suction given output(1220ml/24h), seems clear 3. Ambulate OOB and IS  4. SCD's, start lovenox, and cont PPI  5. Obtain UGI series in AM, if okay remove NGT and start liquids 6. Continue drain, monitor output 7.  BID wet to dry dressing changes    LOS: 9 days    Ryliee Figge  ANP-BC  08/27/2013 9:54 AM

## 2013-08-27 NOTE — Progress Notes (Addendum)
INFECTIOUS DISEASE PROGRESS NOTE  ID: Nathan Rivera is a 67 y.o. male with  Principal Problem:   Severe sepsis Active Problems:   PVD (peripheral vascular disease)   Hypertension   Acute renal failure   Hydronephrosis, left   Renal abscess   Acute respiratory failure with hypoxia   Dehydration   Bacteremia due to ESBL Escherichia coli  Subjective: Without complaints.   Abtx:  Anti-infectives   Start     Dose/Rate Route Frequency Ordered Stop   08/27/13 0200  imipenem-cilastatin (PRIMAXIN) 250 mg in sodium chloride 0.9 % 100 mL IVPB     250 mg 200 mL/hr over 30 Minutes Intravenous Every 6 hours 08/27/13 0051     08/26/13 1400  vancomycin (VANCOCIN) 1,500 mg in sodium chloride 0.9 % 500 mL IVPB  Status:  Discontinued     1,500 mg 250 mL/hr over 120 Minutes Intravenous Every 48 hours 08/24/13 1301 08/24/13 2004   08/25/13 1800  imipenem-cilastatin (PRIMAXIN) 250 mg in sodium chloride 0.9 % 100 mL IVPB  Status:  Discontinued     250 mg 200 mL/hr over 30 Minutes Intravenous Every 12 hours 08/24/13 1245 08/27/13 0051   08/24/13 2345  fluconazole (DIFLUCAN) IVPB 200 mg     200 mg 100 mL/hr over 60 Minutes Intravenous Every 24 hours 08/24/13 2332     08/24/13 1315  vancomycin (VANCOCIN) 2,250 mg in sodium chloride 0.9 % 500 mL IVPB  Status:  Discontinued     2,250 mg 250 mL/hr over 120 Minutes Intravenous  Once 08/24/13 1301 08/24/13 2004   08/20/13 1200  imipenem-cilastatin (PRIMAXIN) 250 mg in sodium chloride 0.9 % 100 mL IVPB  Status:  Discontinued     250 mg 200 mL/hr over 30 Minutes Intravenous 4 times per day 08/20/13 1132 08/24/13 1245   08/18/13 2200  ceFEPIme (MAXIPIME) 1 g in dextrose 5 % 50 mL IVPB  Status:  Discontinued     1 g 100 mL/hr over 30 Minutes Intravenous Every 12 hours 08/18/13 1846 08/18/13 1847   08/18/13 2000  ceFEPIme (MAXIPIME) 1 g in dextrose 5 % 50 mL IVPB  Status:  Discontinued     1 g 100 mL/hr over 30 Minutes Intravenous Every 24 hours 08/18/13  1728 08/18/13 1846   08/18/13 1900  ceFEPIme (MAXIPIME) 1 g in dextrose 5 % 50 mL IVPB  Status:  Discontinued     1 g 100 mL/hr over 30 Minutes Intravenous Every 24 hours 08/18/13 1847 08/20/13 1132      Medications:  Scheduled: . antiseptic oral rinse  15 mL Mouth Rinse QID  . enoxaparin (LOVENOX) injection  30 mg Subcutaneous Q24H  . fluconazole (DIFLUCAN) IV  200 mg Intravenous Q24H  . imipenem-cilastatin  250 mg Intravenous Q6H  . pantoprazole (PROTONIX) IV  40 mg Intravenous Q12H    Objective: Vital signs in last 24 hours: Temp:  [98.1 F (36.7 C)-99.3 F (37.4 C)] 98.1 F (36.7 C) (12/02 0500) Pulse Rate:  [91-102] 95 (12/02 0500) Resp:  [16-20] 20 (12/02 0500) BP: (133-145)/(70-77) 145/74 mmHg (12/02 0500) SpO2:  [93 %-99 %] 97 % (12/02 0500)   General appearance: alert, cooperative and no distress Resp: clear to auscultation bilaterally Cardio: regular rate and rhythm GI: normal findings: bowel sounds normal, soft, non-tender and 2 abd drains, 1 L sided nephrosotomy Extremities: edema none  Lab Results  Recent Labs  08/25/13 0404 08/26/13 0421  WBC 26.6* 15.6*  HGB 9.4* 9.3*  HCT 27.5* 27.4*  NA  148* 154*  K 4.9 4.5  CL 119* 126*  CO2 14* 14*  BUN 102* 95*  CREATININE 4.59* 4.07*   Liver Panel No results found for this basename: PROT, ALBUMIN, AST, ALT, ALKPHOS, BILITOT, BILIDIR, IBILI,  in the last 72 hours Sedimentation Rate No results found for this basename: ESRSEDRATE,  in the last 72 hours C-Reactive Protein No results found for this basename: CRP,  in the last 72 hours  Microbiology: Recent Results (from the past 240 hour(s))  MRSA PCR SCREENING     Status: None   Collection Time    08/18/13  4:18 PM      Result Value Range Status   MRSA by PCR NEGATIVE  NEGATIVE Final   Comment:            The GeneXpert MRSA Assay (FDA     approved for NASAL specimens     only), is one component of a     comprehensive MRSA colonization      surveillance program. It is not     intended to diagnose MRSA     infection nor to guide or     monitor treatment for     MRSA infections.  URINE CULTURE     Status: None   Collection Time    08/18/13  5:13 PM      Result Value Range Status   Specimen Description URINE, CLEAN CATCH   Final   Special Requests NONE   Final   Culture  Setup Time     Final   Value: 08/19/2013 00:11     Performed at [facility] Count     Final   Value: 70,000 COLONIES/ML     Performed at [facility]   Culture     Final   Value: ESCHERICHIA COLI     Note: Confirmed Extended Spectrum Beta-Lactamase Producer (ESBL) CEFOXITIN SENSITIVE <=4     Performed at [facility]   Report Status 08/21/2013 FINAL   Final   Organism ID, Bacteria ESCHERICHIA COLI   Final  CULTURE, ROUTINE-ABSCESS     Status: None   Collection Time    08/18/13  6:49 PM      Result Value Range Status   Specimen Description ABSCESS LEFT KIDNEY   Final   Special Requests NONE   Final   Gram Stain     Final   Value: ABUNDANT WBC PRESENT,BOTH PMN AND MONONUCLEAR     NO SQUAMOUS EPITHELIAL CELLS SEEN     MODERATE GRAM NEGATIVE RODS     Performed at [facility]   Culture     Final   Value: ABUNDANT ESCHERICHIA COLI     Note: Confirmed Extended Spectrum Beta-Lactamase Producer (ESBL) CRITICAL RESULT CALLED TO, READ BACK BY AND VERIFIED WITH: ALBERT R 08/21/13 AT 810 BY [facility]     Performed at [facility]   Report Status 08/21/2013 FINAL   Final   Organism ID, Bacteria ESCHERICHIA COLI   Final  ANAEROBIC CULTURE     Status: None   Collection Time    08/18/13  6:54 PM      Result Value Range Status   Specimen Description ABSCESS LEFT KIDNEY   Final   Special Requests NONE   Final   Gram Stain     Final   Value: ABUNDANT WBC PRESENT,BOTH PMN AND MONONUCLEAR     NO SQUAMOUS EPITHELIAL CELLS SEEN     MODERATE GRAM NEGATIVE  RODS     Performed at [facility]   Culture     Final    Value: NO ANAEROBES ISOLATED     Performed at [facility]   Report Status 08/23/2013 FINAL   Final  CULTURE, BLOOD (ROUTINE X 2)     Status: None   Collection Time    08/18/13  7:05 PM      Result Value Range Status   Specimen Description BLOOD RIGHT HAND   Final   Special Requests BOTTLES DRAWN AEROBIC AND ANAEROBIC 10CC   Final   Culture  Setup Time     Final   Value: 08/19/2013 00:10     Performed at [facility]   Culture     Final   Value: NO GROWTH 5 DAYS     Performed at [facility]   Report Status 08/24/2013 FINAL   Final  CULTURE, BLOOD (ROUTINE X 2)     Status: None   Collection Time    08/18/13  9:45 PM      Result Value Range Status   Specimen Description BLOOD LEFT HAND   Final   Special Requests BOTTLES DRAWN AEROBIC AND ANAEROBIC 10CC EA   Final   Culture  Setup Time     Final   Value: 08/19/2013 02:54     Performed at [facility]   Culture     Final   Value: NO GROWTH 5 DAYS     Performed at [facility]   Report Status 08/25/2013 FINAL   Final  CULTURE, BLOOD (ROUTINE X 2)     Status: None   Collection Time    08/20/13  4:40 PM      Result Value Range Status   Specimen Description BLOOD LEFT ARM   Final   Special Requests BOTTLES DRAWN AEROBIC ONLY 3CC   Final   Culture  Setup Time     Final   Value: 08/21/2013 02:13     Performed at [facility]   Culture     Final   Value: NO GROWTH 5 DAYS     Performed at [facility]   Report Status 08/27/2013 FINAL   Final  CULTURE, BLOOD (ROUTINE X 2)     Status: None   Collection Time    08/20/13  4:55 PM      Result Value Range Status   Specimen Description BLOOD RIGHT ARM   Final   Special Requests BOTTLES DRAWN AEROBIC ONLY 10CC   Final   Culture  Setup Time     Final   Value: 08/21/2013 02:13     Performed at [facility]   Culture     Final   Value: NO GROWTH 5 DAYS     Performed at [facility]   Report Status 08/27/2013  FINAL   Final  MRSA PCR SCREENING     Status: None   Collection Time    08/24/13  8:07 PM      Result Value Range Status   MRSA by PCR NEGATIVE  NEGATIVE Final   Comment:            The GeneXpert MRSA Assay (FDA     approved for NASAL specimens     only), is one component of a     comprehensive MRSA colonization     surveillance program. It is not     intended to diagnose MRSA     infection  nor to guide or     monitor treatment for     MRSA infections.  BODY FLUID CULTURE     Status: None   Collection Time    08/25/13  3:36 PM      Result Value Range Status   Specimen Description PERITONEAL CAVITY   Final   Special Requests Normal   Final   Gram Stain     Final   Value: ABUNDANT WBC PRESENT,BOTH PMN AND MONONUCLEAR     NO ORGANISMS SEEN     Performed at [facility]   Culture     Final   Value: NO GROWTH     Performed at [facility]   Report Status PENDING   Incomplete    Studies/Results: Dg Chest Port 1 View  08/26/2013   CLINICAL DATA:  Endotracheal tube removal  EXAM: PORTABLE CHEST - 1 VIEW  COMPARISON:  08/24/2013, 08/18/2013  FINDINGS: There has been interval removal of the endotracheal tube. There is a nasogastric tube in unchanged position. There is elevation of the right diaphragm. There is left basilar atelectasis versus scarring. There is no focal consolidation, pleural effusion or pneumothorax. Normal cardiomediastinal silhouette. The osseous structures are unremarkable.  IMPRESSION: Interval removal of the endotracheal tube. Otherwise no significant interval change.   Electronically Signed   By: Elige Ko   On: 08/26/2013 08:15     Assessment/Plan: Perforated duodenal ulcer (11-29) Sepsis  Renal Abscess, ESBL E coli (11-23) 1 cm L ureteral calculus Peritoneal fluid Cx NGTD   No new labs, afebrile  No change in anbx for now.  Total days of antibiotics: 10 (imipenem/diflucan day 4) Would aim for 21 days of anbx from his renal drain on  11-23.  Plan for repeat CT abd (to plan staging of stone extraction) prior to d/c (pt and wife believe this will be around 12-8/9).  hold on PIC with his poor CrCl  Will defer to surgery re: length of anbx post perforated viscus repair.           Johny Sax Infectious Diseases (pager) [phone] [email or web address] 08/27/2013, 10:37 AM  LOS: 9 days

## 2013-08-27 NOTE — Progress Notes (Signed)
[facility] Progress Note    Nathan Rivera YNW:[id number] DOB: Nov 06, 1945 DOA: 08/18/2013 PCP: [name], MD  Brief narrative: 67 year old male with history of hypertension, anemia, history of bladder cancer , hyperlipidemia, tobacco abuse, nephrolithiasis was originally admitted on 08/17/13 at [facility] hospital. Patient was apparently having issues with hematuria and UTI as an outpatient and was being treated with ciprofloxacin and Augmentin. Patient was lethargic on the day of admission and hypotensive with BP of 74/44. He was admitted to the ICU with septic shock and was found to have gram-negative sepsis ( final blood cultures are still pending). Ultrasound of the abdomen did not show any evidence for obstruction in the gallbladder area but was noted to have 1 cm stone with hydronephrosis. CT of the abdomen done today showed minimal cholelithiasis, severe left hydronephrosis secondary to 15 x 7 MM calculus in the middle portion of the left ureter. Urology was consulted and patient was seen by Dr. Yevonne Aline at [facility] who felt given patient's severe sepsis picture, significant leukocytosis, elevated creatinine and large proximal ureteral stone, he needs a nephrostomy tube. Risk of stent would be inability to pass the stent or it might not fully drain his urinary tract and hence a nephrostomy tube was felt more appropriate. Patient was transferred to [facility] for emergent interventional radiology consultation for nephrostomy tube.   After admission he developed refractory hypotension that required pressors so was transferred to ICU. Prior to this he had undergone successful placement of a percutaneous nephrostomy tube by IR with frank purulence noted.   Subsequently was transferred to the step down unit where he stabilized and therefore transferred up to the regular floor. Over a period of several days the patient began to develop abdominal pain with distention and inability to  pass flatus. Initial x-rays were concerning for an ileus but due to increasing pain CT abdomen was obtained and revealed free air. Patient was subsequently taken to the operating room on 08/24/2013 where he underwent exploratory laparotomy for repair of perforated duodenal ulcer. In the immediate postop period he was sent back to the ICU and remained intubated for a total of 24 hours. He stabilized and was transferred out to a regular floor on 08/26/2013.   Assessment/Plan: Perforated duodenal ulcer -x ray on 11/27 showed mild ileus- but CT abd/pelvis 11/29 revealed free air - underwent EL/graham patch closure of perf DU -still with NGT-bowel sounds present but primary post op concerns are for possible patch leak therefore will need UGIS before dc NG or begin diet -cont anbx's    Severe sepsis -resolved -source was urinary tract -cont supportive care -ID following:   Total days of antibiotics: 10 (imipenem/diflucan day 4)    Would aim for 21 days of anbx from initial placement his renal drain on 11-23.    Hold on PICC with his poor CrCl    Will defer to surgery re: length of anbx post perforated viscus repair.   Hypernatremia/ Dehydration -cont Dextrose IVF but increase rate   Hydronephrosis, left/Renal abscess/1 cm ureteral calculus -post perc. nephro tube - dc out with perc nephro tube to follow up with Urology here as family declines to go back to [facility] -Plan for repeat CT abd (to plan staging of stone extraction) prior to d/c (pt and wife believe this will be around 12-8/9).  -foley dc'd 12/01    Bacteremia due to ESBL Escherichia coli -initially treated with Cefepime -fianl cx called 11/25 from [place] with E. 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 Renal stone causing hydronephrosis and infection -Brief recurrence in the immediate postoperative period but now has stabilized    Acute respiratory failure with hypoxia -due to sepsis, ARF and volume overload from prior resuscitative efforts -stable on RA    PVD (peripheral vascular disease)    Hypertension -hold home meds for now   DVT prophylaxis: SCDs Code Status: Full Family Communication: Patient Isolation: Contact isolation for ESBL positive Escherichia coli bacteremia Nutritional Status: Acute protein calorie malnutrition related to recent sepsis and acute perforated duodenal ulcer requiring operative intervention  Consultants: PCCM ID IR CCS  Procedures: - 11/24 Left IJ CVC - d/c 11/25 - xxx Post left percutaneous nephrostomy. -EL with graham patch closure/omental patch perforated DU with primary repair UH 11/29    CULTURES:  11/23 blood culture>>> ESBL positive E. Coli (2/2) from [place] 11/23 aerobic culture>>>  11/23 urine culture>>>  Antibiotics: Cefepime 11/23 >>> 11/25 Primaxin 11/25 >>> Diflucan 11/29 >>>  HPI/Subjective: In good spirits, has peri incisional pain, Denies SOB or CP, nausea.  Objective: Blood pressure 145/74, pulse 95, temperature 98.1 F (36.7 C), temperature source Axillary, resp. rate 20, height 5\' 11"  (1.803 m), weight 239 lb (108.41 kg), SpO2 97.00%.  Intake/Output Summary (Last 24 hours) at 08/27/13 1300 Last data filed at 08/27/13 4098  Gross per 24 hour  Intake   2100 ml  Output   5070 ml  Net  -2970 ml     Exam: General: No acute respiratory distress, appear uncomfortable Lungs: Clear to auscultation bilaterally without wheezes or crackles, RA Cardiovascular: Regular rate and rhythm without murmur gallop or rub normal S1 and S2, no peripheral edema or JVD Abdomen:  Soft, non distended, NGT to LWS, normoactive bowel sounds, insicion covered by dressing Genitourinary; Left percutaneous nephrostomy  tube - urine darker Musculoskeletal: No significant cyanosis, clubbing of bilateral lower extremities Neurological: Alert and oriented x 3, moves all extremities x 4 without focal neurological deficits, CN 2-12 intact  Scheduled Meds:  Scheduled Meds: . antiseptic oral rinse  15 mL Mouth Rinse QID  . enoxaparin (LOVENOX) injection  30 mg Subcutaneous Q24H  . fluconazole (DIFLUCAN) IV  200 mg Intravenous Q24H  . imipenem-cilastatin  250 mg Intravenous Q6H  . pantoprazole (PROTONIX) IV  40 mg Intravenous Q12H   Continuous Infusions: . dextrose 100 mL/hr at 08/27/13 1117      Data Reviewed: Basic Metabolic Panel:  Recent Labs Lab 08/22/13 0558 08/23/13 0930 08/24/13 0701 08/25/13 0404 08/26/13 0421  NA 149* 147* 151* 148* 154*  K 4.2 4.8 4.9 4.9 4.5  CL 125* 119* 120* 119* 126*  CO2 14* 14* 17* 14* 14*  GLUCOSE 102* 122* 96 99 112*  BUN 67* 73* 98* 102* 95*  CREATININE 2.35* 2.80* 4.44* 4.59* 4.07*  CALCIUM 8.0* 8.6 8.5 7.4* 7.7*   Liver Function Tests: No results found for this basename: AST, ALT, ALKPHOS, BILITOT, PROT, ALBUMIN,  in the last 168 hours No results found for this basename: LIPASE, AMYLASE,  in the last 168 hours No results found for this basename: AMMONIA,  in the last 168 hours CBC:  Recent Labs Lab 08/23/13 0930 08/23/13 1818 08/24/13 0701 08/25/13 0404 08/26/13 0421  WBC 40.8* 44.0* 40.5* 26.6* 15.6*  NEUTROABS  --   --  36.9*  --   --   HGB 12.2* 11.8* 11.4* 9.4* 9.3*  HCT 36.0* 34.1* 32.9* 27.5* 27.4*  MCV 86.1 85.7 85.2 86.8 87.0  PLT 309 328 331 321 328   Cardiac Enzymes: No results found for this basename: CKTOTAL, CKMB, CKMBINDEX, TROPONINI,  in the last 168 hours BNP (last 3 results) No results found for this basename: PROBNP,  in the last 8760 hours CBG:  Recent Labs Lab 08/24/13 1403 08/24/13 1829  GLUCAP 83 111*    Recent Results (from the past 240 hour(s))  MRSA PCR SCREENING     Status: None   Collection Time     08/18/13  4:18 PM      Result Value Range Status   MRSA by PCR NEGATIVE  NEGATIVE Final   Comment:            The GeneXpert MRSA Assay (FDA     approved for NASAL specimens     only), is one component of a     comprehensive MRSA colonization     surveillance program. It is not     intended to diagnose MRSA     infection nor to guide or     monitor treatment for     MRSA infections.  URINE CULTURE     Status: None   Collection Time    08/18/13  5:13 PM      Result Value Range Status   Specimen Description URINE, CLEAN CATCH   Final   Special Requests NONE   Final   Culture  Setup Time     Final   Value: 08/19/2013 00:11     Performed at [facility] Count     Final   Value: 70,000 COLONIES/ML     Performed at [facility]   Culture     Final   Value: ESCHERICHIA COLI     Note: Confirmed Extended Spectrum Beta-Lactamase Producer (ESBL) CEFOXITIN SENSITIVE <=4     Performed at [facility]   Report Status 08/21/2013 FINAL   Final   Organism ID, Bacteria ESCHERICHIA COLI   Final  CULTURE, ROUTINE-ABSCESS     Status: None   Collection Time    08/18/13  6:49 PM      Result Value Range Status   Specimen Description ABSCESS LEFT KIDNEY   Final   Special Requests NONE   Final   Gram Stain     Final   Value: ABUNDANT WBC PRESENT,BOTH PMN AND MONONUCLEAR     NO SQUAMOUS EPITHELIAL CELLS SEEN     MODERATE GRAM NEGATIVE RODS     Performed at [facility]   Culture     Final   Value: ABUNDANT ESCHERICHIA COLI     Note: Confirmed Extended Spectrum Beta-Lactamase Producer (ESBL) CRITICAL RESULT CALLED TO, READ BACK BY AND VERIFIED WITH: ALBERT R 08/21/13 AT 810 BY [facility]     Performed at [facility]   Report Status 08/21/2013 FINAL   Final   Organism ID, Bacteria ESCHERICHIA COLI   Final  ANAEROBIC CULTURE     Status: None   Collection Time    08/18/13  6:54 PM      Result Value Range Status   Specimen Description ABSCESS LEFT  KIDNEY   Final   Special Requests NONE   Final   Gram Stain     Final   Value: ABUNDANT WBC PRESENT,BOTH PMN  AND MONONUCLEAR     NO SQUAMOUS EPITHELIAL CELLS SEEN     MODERATE GRAM NEGATIVE RODS     Performed at [facility]   Culture     Final   Value: NO ANAEROBES ISOLATED     Performed at [facility]   Report Status 08/23/2013 FINAL   Final  CULTURE, BLOOD (ROUTINE X 2)     Status: None   Collection Time    08/18/13  7:05 PM      Result Value Range Status   Specimen Description BLOOD RIGHT HAND   Final   Special Requests BOTTLES DRAWN AEROBIC AND ANAEROBIC 10CC   Final   Culture  Setup Time     Final   Value: 08/19/2013 00:10     Performed at [facility]   Culture     Final   Value: NO GROWTH 5 DAYS     Performed at [facility]   Report Status 08/24/2013 FINAL   Final  CULTURE, BLOOD (ROUTINE X 2)     Status: None   Collection Time    08/18/13  9:45 PM      Result Value Range Status   Specimen Description BLOOD LEFT HAND   Final   Special Requests BOTTLES DRAWN AEROBIC AND ANAEROBIC 10CC EA   Final   Culture  Setup Time     Final   Value: 08/19/2013 02:54     Performed at [facility]   Culture     Final   Value: NO GROWTH 5 DAYS     Performed at [facility]   Report Status 08/25/2013 FINAL   Final  CULTURE, BLOOD (ROUTINE X 2)     Status: None   Collection Time    08/20/13  4:40 PM      Result Value Range Status   Specimen Description BLOOD LEFT ARM   Final   Special Requests BOTTLES DRAWN AEROBIC ONLY 3CC   Final   Culture  Setup Time     Final   Value: 08/21/2013 02:13     Performed at [facility]   Culture     Final   Value: NO GROWTH 5 DAYS     Performed at [facility]   Report Status 08/27/2013 FINAL   Final  CULTURE, BLOOD (ROUTINE X 2)     Status: None   Collection Time    08/20/13  4:55 PM      Result Value Range Status   Specimen Description BLOOD RIGHT ARM   Final   Special  Requests BOTTLES DRAWN AEROBIC ONLY 10CC   Final   Culture  Setup Time     Final   Value: 08/21/2013 02:13     Performed at [facility]   Culture     Final   Value: NO GROWTH 5 DAYS     Performed at [facility]   Report Status 08/27/2013 FINAL   Final  MRSA PCR SCREENING     Status: None   Collection Time    08/24/13  8:07 PM      Result Value Range Status   MRSA by PCR NEGATIVE  NEGATIVE Final   Comment:            The GeneXpert MRSA Assay (FDA     approved for NASAL specimens     only), is one component of a     comprehensive MRSA colonization     surveillance  program. It is not     intended to diagnose MRSA     infection nor to guide or     monitor treatment for     MRSA infections.  BODY FLUID CULTURE     Status: None   Collection Time    08/25/13  3:36 PM      Result Value Range Status   Specimen Description PERITONEAL CAVITY   Final   Special Requests Normal   Final   Gram Stain     Final   Value: ABUNDANT WBC PRESENT,BOTH PMN AND MONONUCLEAR     NO ORGANISMS SEEN     Performed at [facility]   Culture     Final   Value: NO GROWTH 1 DAY     Performed at [facility]   Report Status PENDING   Incomplete          Junious Silk, ANP [facility] Pager [phone]   If 7PM-7AM, please contact night-coverage [email or web address] Password [email or web address] 08/27/2013, 1:00 PM   LOS: 9 days   I have personally examined the patient and reviewed the entire database. Agree with the above note and plan as outlined, any necessary changes made.  RAI,RIPUDEEP M.D. [facility] 08/27/2013, 4:48 PM Pager: [phone]  If 7PM-7AM, please contact night-coverage [email or web address] Password [email or web address]

## 2013-08-27 NOTE — Progress Notes (Signed)
ANTIBIOTIC CONSULT NOTE -Follow Up  Pharmacy Consult for Fluconazole, primaxin Indication: Intra-abdominal infection, bowel perf, ecoli sepsis  No Known Allergies  Patient Measurements: Height: 5\' 11"  (180.3 cm) Weight: 239 lb (108.41 kg) IBW/kg (Calculated) : 75.3  Vital Signs: Temp: 98.1 F (36.7 C) (12/02 0500) Temp src: Axillary (12/02 0500) BP: 145/74 mmHg (12/02 0500) Pulse Rate: 95 (12/02 0500) Intake/Output from previous day: 12/01 0701 - 12/02 0700 In: 2710 [I.V.:2410; IV Piggyback:300] Out: 5750 [Urine:4040; Emesis/NG output:1250; Drains:460] Intake/Output from this shift:    Labs:  Recent Labs  08/25/13 0404 08/26/13 0421  WBC 26.6* 15.6*  HGB 9.4* 9.3*  PLT 321 328  CREATININE 4.59* 4.07*   Estimated Creatinine Clearance: 22 ml/min (by C-G formula based on Cr of 4.07). No results found for this basename: VANCOTROUGH, Leodis Binet, VANCORANDOM, GENTTROUGH, GENTPEAK, GENTRANDOM, TOBRATROUGH, TOBRAPEAK, TOBRARND, AMIKACINPEAK, AMIKACINTROU, AMIKACIN,  in the last 72 hours   Microbiology: Recent Results (from the past 720 hour(s))  MRSA PCR SCREENING     Status: None   Collection Time    08/18/13  4:18 PM      Result Value Range Status   MRSA by PCR NEGATIVE  NEGATIVE Final   Comment:            The GeneXpert MRSA Assay (FDA     approved for NASAL specimens     only), is one component of a     comprehensive MRSA colonization     surveillance program. It is not     intended to diagnose MRSA     infection nor to guide or     monitor treatment for     MRSA infections.  URINE CULTURE     Status: None   Collection Time    08/18/13  5:13 PM      Result Value Range Status   Specimen Description URINE, CLEAN CATCH   Final   Special Requests NONE   Final   Culture  Setup Time     Final   Value: 08/19/2013 00:11     Performed at [facility] Count     Final   Value: 70,000 COLONIES/ML     Performed at [facility]   Culture      Final   Value: ESCHERICHIA COLI     Note: Confirmed Extended Spectrum Beta-Lactamase Producer (ESBL) CEFOXITIN SENSITIVE <=4     Performed at [facility]   Report Status 08/21/2013 FINAL   Final   Organism ID, Bacteria ESCHERICHIA COLI   Final  CULTURE, ROUTINE-ABSCESS     Status: None   Collection Time    08/18/13  6:49 PM      Result Value Range Status   Specimen Description ABSCESS LEFT KIDNEY   Final   Special Requests NONE   Final   Gram Stain     Final   Value: ABUNDANT WBC PRESENT,BOTH PMN AND MONONUCLEAR     NO SQUAMOUS EPITHELIAL CELLS SEEN     MODERATE GRAM NEGATIVE RODS     Performed at [facility]   Culture     Final   Value: ABUNDANT ESCHERICHIA COLI     Note: Confirmed Extended Spectrum Beta-Lactamase Producer (ESBL) CRITICAL RESULT CALLED TO, READ BACK BY AND VERIFIED WITH: ALBERT R 08/21/13 AT 810 BY [facility]     Performed at [facility]   Report Status 08/21/2013 FINAL   Final   Organism ID, Bacteria ESCHERICHIA COLI   Final  ANAEROBIC CULTURE     Status: None   Collection Time    08/18/13  6:54 PM      Result Value Range Status   Specimen Description ABSCESS LEFT KIDNEY   Final   Special Requests NONE   Final   Gram Stain     Final   Value: ABUNDANT WBC PRESENT,BOTH PMN AND MONONUCLEAR     NO SQUAMOUS EPITHELIAL CELLS SEEN     MODERATE GRAM NEGATIVE RODS     Performed at [facility]   Culture     Final   Value: NO ANAEROBES ISOLATED     Performed at [facility]   Report Status 08/23/2013 FINAL   Final  CULTURE, BLOOD (ROUTINE X 2)     Status: None   Collection Time    08/18/13  7:05 PM      Result Value Range Status   Specimen Description BLOOD RIGHT HAND   Final   Special Requests BOTTLES DRAWN AEROBIC AND ANAEROBIC 10CC   Final   Culture  Setup Time     Final   Value: 08/19/2013 00:10     Performed at [facility]   Culture     Final   Value: NO GROWTH 5 DAYS     Performed at [facility]    Report Status 08/24/2013 FINAL   Final  CULTURE, BLOOD (ROUTINE X 2)     Status: None   Collection Time    08/18/13  9:45 PM      Result Value Range Status   Specimen Description BLOOD LEFT HAND   Final   Special Requests BOTTLES DRAWN AEROBIC AND ANAEROBIC 10CC EA   Final   Culture  Setup Time     Final   Value: 08/19/2013 02:54     Performed at [facility]   Culture     Final   Value: NO GROWTH 5 DAYS     Performed at [facility]   Report Status 08/25/2013 FINAL   Final  CULTURE, BLOOD (ROUTINE X 2)     Status: None   Collection Time    08/20/13  4:40 PM      Result Value Range Status   Specimen Description BLOOD LEFT ARM   Final   Special Requests BOTTLES DRAWN AEROBIC ONLY 3CC   Final   Culture  Setup Time     Final   Value: 08/21/2013 02:13     Performed at [facility]   Culture     Final   Value:        BLOOD CULTURE RECEIVED NO GROWTH TO DATE CULTURE WILL BE HELD FOR 5 DAYS BEFORE ISSUING A FINAL NEGATIVE REPORT     Performed at [facility]   Report Status PENDING   Incomplete  CULTURE, BLOOD (ROUTINE X 2)     Status: None   Collection Time    08/20/13  4:55 PM      Result Value Range Status   Specimen Description BLOOD RIGHT ARM   Final   Special Requests BOTTLES DRAWN AEROBIC ONLY 10CC   Final   Culture  Setup Time     Final   Value: 08/21/2013 02:13     Performed at [facility]   Culture     Final   Value:        BLOOD CULTURE RECEIVED NO GROWTH TO DATE CULTURE WILL BE HELD FOR 5 DAYS BEFORE ISSUING A FINAL NEGATIVE  REPORT     Performed at [facility]   Report Status PENDING   Incomplete  MRSA PCR SCREENING     Status: None   Collection Time    08/24/13  8:07 PM      Result Value Range Status   MRSA by PCR NEGATIVE  NEGATIVE Final   Comment:            The GeneXpert MRSA Assay (FDA     approved for NASAL specimens     only), is one component of a     comprehensive MRSA colonization     surveillance  program. It is not     intended to diagnose MRSA     infection nor to guide or     monitor treatment for     MRSA infections.  BODY FLUID CULTURE     Status: None   Collection Time    08/25/13  3:36 PM      Result Value Range Status   Specimen Description PERITONEAL CAVITY   Final   Special Requests Normal   Final   Gram Stain     Final   Value: ABUNDANT WBC PRESENT,BOTH PMN AND MONONUCLEAR     NO ORGANISMS SEEN     Performed at [facility]   Culture     Final   Value: NO GROWTH     Performed at [facility]   Report Status PENDING   Incomplete    Medical History: Past Medical History  Diagnosis Date  . Arthritis   . Degenerative disc disease   . Degenerative joint disease   . Aortic aneurysm   . Pneumonia   . Hypercholesterolemia   . Hypertension   . CKD (chronic kidney disease), stage III    Assessment: 67 y/o M to continue on broad spectrum antibiotics.  Renal function improving.   Goal of Therapy:  Clinical resolution   Plan:  -Cont fluconazole 200 mg IV q24h -Primaxin changed to 250 mg IV q6 hours  Thank you for allowing me to take part in this patient's care,  Talbert Cage, PharmD Clinical Pharmacist Phone: [phone] 08/27/2013 8:09 AM

## 2013-08-27 NOTE — Progress Notes (Signed)
PT Cancellation Note  Patient Details Name: Nathan Rivera MRN: [id number] DOB: 06/17/1946   Cancelled Treatment:    Reason Eval/Treat Not Completed: Pain limiting ability to participate;Fatigue/lethargy limiting ability to participate; patient reports up to bathroom earlier and now in too much pain to participate.  RN offered pain meds, pt continued to decline reporting wants to wait until tomorrow.  Will attempt tomorrow for PT evaluation/re-evaluation.   WYNN,CYNDI 08/27/2013, 3:00 PM

## 2013-08-27 NOTE — Progress Notes (Signed)
Patient seen and examined. Still feels weak, still with abd pain. His abd seems soft. The JP's were not charged, ? Bile stained.   Will need gastrograffin study in am.

## 2013-08-28 ENCOUNTER — Inpatient Hospital Stay (HOSPITAL_COMMUNITY): Payer: Medicare Other

## 2013-08-28 DIAGNOSIS — I10 Essential (primary) hypertension: Secondary | ICD-10-CM

## 2013-08-28 DIAGNOSIS — I739 Peripheral vascular disease, unspecified: Secondary | ICD-10-CM

## 2013-08-28 LAB — BASIC METABOLIC PANEL
BUN: 58 mg/dL — ABNORMAL HIGH (ref 6–23)
Calcium: 8.1 mg/dL — ABNORMAL LOW (ref 8.4–10.5)
Creatinine, Ser: 2.42 mg/dL — ABNORMAL HIGH (ref 0.50–1.35)
GFR calc Af Amer: 30 mL/min — ABNORMAL LOW (ref >90)
GFR calc non Af Amer: 26 mL/min — ABNORMAL LOW (ref >90)
Glucose, Bld: 106 mg/dL — ABNORMAL HIGH (ref 70–99)
Potassium: 4.3 mEq/L (ref 3.5–5.1)
Sodium: 153 mEq/L — ABNORMAL HIGH (ref 135–145)

## 2013-08-28 LAB — CBC
Hemoglobin: 9.1 g/dL — ABNORMAL LOW (ref 13.0–17.0)
MCH: 28.9 pg (ref 26.0–34.0)
MCHC: 32.3 g/dL (ref 30.0–36.0)
Platelets: 285 10*3/uL (ref 150–400)
RDW: 18.9 % — ABNORMAL HIGH (ref 11.5–15.5)

## 2013-08-28 MED ORDER — IOHEXOL 300 MG/ML  SOLN
150.0000 mL | Freq: Once | INTRAMUSCULAR | Status: AC | PRN
  Administered 2013-08-28: 150 mL via ORAL

## 2013-08-28 NOTE — Progress Notes (Signed)
[facility]                                                                                Patient Demographics  Nathan Rivera, is a 67 y.o. male, DOB - 1946-06-16, ZOX:[id number]  Admit date - 08/18/2013   Admitting Physician Costin Otelia Sergeant, MD  Outpatient Primary MD for the patient is [facility], Madaline Guthrie, MD  LOS - 10   No chief complaint on file.       Assessment & Plan  Principal Problem:   Severe sepsis Active Problems:   PVD (peripheral vascular disease)   Hypertension   Acute renal failure   Hydronephrosis, left   Renal abscess   Acute respiratory failure with hypoxia   Dehydration   Bacteremia due to ESBL Escherichia coli  Perforated duodenal ulcer  -x ray on 11/27 showed mild ileus- but CT abd/pelvis 11/29 revealed free air  - underwent EL/graham patch closure of perf DU  -still with NGT-bowel sounds present but primary post op concerns are for possible patch leak therefore will need UGIS before dc NG or begin diet  -cont anbx's   Severe sepsis  -resolved, leukocytosis trending downward- 10 -source was urinary tract  -Infectious disease following and recommends 10 days total of antibiotics (Imipenem and Diflucan), currently on day 5; as well as 21 days of anbx from initial placement his renal drain on 11-23.  -Will defer to surgery re: length of anbx post perforated viscus repair.   Hypernatremia/ Dehydration  -cont Dextrose IVF but increase rate  -Na 153, will continue to monitor  Hydronephrosis, left/Renal abscess/1 cm ureteral calculus  -post perc. nephro tube  - dc out with perc nephro tube to follow up with Urology here as family declines to go back to [facility]  -Plan for repeat CT abd (to plan staging of stone extraction) prior to d/c (pt and wife believe this will be around 12-8/9).  -foley dc'd 12/01   Bacteremia due to ESBL Escherichia coli  -initially treated with Cefepime  -final culture called 11/25 from [place] with E. Coli positive  ESBL  -d/w ID- recommend change to Imipenem, d/c CL that was placed 11/23 and obtained 3rd set of blood cx's after CL removed   Acute renal failure  -Creatinine trending downward 2.42, will continue to monitor -Likely secondary to obstructive uropathy due to renal stone with hydronephrosis and infection -Will contact Urology for further management.  Acute respiratory failure with hypoxia  -due to sepsis, ARF and volume overload from prior resuscitative efforts  -stable on RA   PVD (peripheral vascular disease)  -Stable  Hypertension  -hold home meds for now  Code Status: Full  Family Communication: Wife at bedside.  Disposition Plan: Patient currently admitted. Will likely need home health on discharge. Currently being transitioned from NG tube 2 sips of clear liquids as dictated by surgery.  Procedures  -Status post left percutaneous nephrostomy -Exploratory laparotomy with patch closure/omental patch perforated duodenal ulcer with primary repair  Consults   PC CM ID IR CCS  DVT Prophylaxis  Lovenox   Lab Results  Component Value Date   PLT 285 08/28/2013    Medications  Scheduled Meds: . antiseptic  oral rinse  15 mL Mouth Rinse QID  . enoxaparin (LOVENOX) injection  30 mg Subcutaneous Q24H  . fluconazole (DIFLUCAN) IV  200 mg Intravenous Q24H  . imipenem-cilastatin  250 mg Intravenous Q6H  . pantoprazole (PROTONIX) IV  40 mg Intravenous Q12H   Continuous Infusions: . dextrose 125 mL/hr at 08/28/13 0637   PRN Meds:.albuterol, fentaNYL, ondansetron (ZOFRAN) IV, ondansetron, sodium chloride, sodium chloride  Antibiotics    Anti-infectives   Start     Dose/Rate Route Frequency Ordered Stop   08/27/13 0200  imipenem-cilastatin (PRIMAXIN) 250 mg in sodium chloride 0.9 % 100 mL IVPB     250 mg 200 mL/hr over 30 Minutes Intravenous Every 6 hours 08/27/13 0051     08/26/13 1400  vancomycin (VANCOCIN) 1,500 mg in sodium chloride 0.9 % 500 mL IVPB  Status:   Discontinued     1,500 mg 250 mL/hr over 120 Minutes Intravenous Every 48 hours 08/24/13 1301 08/24/13 2004   08/25/13 1800  imipenem-cilastatin (PRIMAXIN) 250 mg in sodium chloride 0.9 % 100 mL IVPB  Status:  Discontinued     250 mg 200 mL/hr over 30 Minutes Intravenous Every 12 hours 08/24/13 1245 08/27/13 0051   08/24/13 2345  fluconazole (DIFLUCAN) IVPB 200 mg     200 mg 100 mL/hr over 60 Minutes Intravenous Every 24 hours 08/24/13 2332     08/24/13 1315  vancomycin (VANCOCIN) 2,250 mg in sodium chloride 0.9 % 500 mL IVPB  Status:  Discontinued     2,250 mg 250 mL/hr over 120 Minutes Intravenous  Once 08/24/13 1301 08/24/13 2004   08/20/13 1200  imipenem-cilastatin (PRIMAXIN) 250 mg in sodium chloride 0.9 % 100 mL IVPB  Status:  Discontinued     250 mg 200 mL/hr over 30 Minutes Intravenous 4 times per day 08/20/13 1132 08/24/13 1245   08/18/13 2200  ceFEPIme (MAXIPIME) 1 g in dextrose 5 % 50 mL IVPB  Status:  Discontinued     1 g 100 mL/hr over 30 Minutes Intravenous Every 12 hours 08/18/13 1846 08/18/13 1847   08/18/13 2000  ceFEPIme (MAXIPIME) 1 g in dextrose 5 % 50 mL IVPB  Status:  Discontinued     1 g 100 mL/hr over 30 Minutes Intravenous Every 24 hours 08/18/13 1728 08/18/13 1846   08/18/13 1900  ceFEPIme (MAXIPIME) 1 g in dextrose 5 % 50 mL IVPB  Status:  Discontinued     1 g 100 mL/hr over 30 Minutes Intravenous Every 24 hours 08/18/13 1847 08/20/13 1132       Time Spent in minutes   30 minutes   Laresa Oshiro D.O. on 08/28/2013 at 12:00 PM  Between 7am to 7pm - Pager - [phone]  After 7pm go to [email or web address] - password [email or web address]  And look for the night coverage person covering for me after hours  [facility] Office  [phone]    Subjective:   Nizar Cutler seen and examined today.  Patient wishes eat and drink as he states he is very hungry.  He has no other complaints at this time. Patient denies dizziness, chest pain, shortness of breath,  abdominal pain, N/V/D/C, new weakness, numbess, tingling.    Objective:   Filed Vitals:   08/27/13 0500 08/27/13 1411 08/27/13 2131 08/28/13 0647  BP: 145/74 143/79 136/76 122/76  Pulse: 95 89 85 77  Temp: 98.1 F (36.7 C) 97.5 F (36.4 C) 97.7 F (36.5 C) 98.2 F (36.8 C)  TempSrc: Axillary Axillary Oral   Resp: 20  16 17 16   Height:      Weight:      SpO2: 97% 100% 95% 97%    Wt Readings from Last 3 Encounters:  08/24/13 108.41 kg (239 lb)  08/24/13 108.41 kg (239 lb)  08/29/11 115.44 kg (254 lb 8 oz)     Intake/Output Summary (Last 24 hours) at 08/28/13 1200 Last data filed at 08/28/13 1100  Gross per 24 hour  Intake 3313.75 ml  Output   5055 ml  Net -1741.25 ml    Exam  General: Well developed, well nourished, NAD, appears stated age  HEENT: NCAT, PERRLA, EOMI, Anicteic Sclera, mucous membranes moist. No pharyngeal erythema or exudates  Neck: Supple, no JVD, no masses  Cardiovascular: S1 S2 auscultated, no rubs, murmurs or gallops. Regular rate and rhythm.  Respiratory: Clear to auscultation bilaterally with equal chest rise  Abdomen: Soft, nontender, nondistended, + bowel sounds, Drains noted and are clean, drains also noted left and right abdomen.  Extremities: warm dry without cyanosis clubbing or edema  Neuro: AAOx3, cranial nerves grossly intact. Skin: Without rashes exudates or nodules  Psych: Normal affect and demeanor with intact judgement and insight  Data Review   Micro Results Recent Results (from the past 240 hour(s))  MRSA PCR SCREENING     Status: None   Collection Time    08/18/13  4:18 PM      Result Value Range Status   MRSA by PCR NEGATIVE  NEGATIVE Final   Comment:            The GeneXpert MRSA Assay (FDA     approved for NASAL specimens     only), is one component of a     comprehensive MRSA colonization     surveillance program. It is not     intended to diagnose MRSA     infection nor to guide or     monitor treatment  for     MRSA infections.  URINE CULTURE     Status: None   Collection Time    08/18/13  5:13 PM      Result Value Range Status   Specimen Description URINE, CLEAN CATCH   Final   Special Requests NONE   Final   Culture  Setup Time     Final   Value: 08/19/2013 00:11     Performed at [facility] Count     Final   Value: 70,000 COLONIES/ML     Performed at [facility]   Culture     Final   Value: ESCHERICHIA COLI     Note: Confirmed Extended Spectrum Beta-Lactamase Producer (ESBL) CEFOXITIN SENSITIVE <=4     Performed at [facility]   Report Status 08/21/2013 FINAL   Final   Organism ID, Bacteria ESCHERICHIA COLI   Final  CULTURE, ROUTINE-ABSCESS     Status: None   Collection Time    08/18/13  6:49 PM      Result Value Range Status   Specimen Description ABSCESS LEFT KIDNEY   Final   Special Requests NONE   Final   Gram Stain     Final   Value: ABUNDANT WBC PRESENT,BOTH PMN AND MONONUCLEAR     NO SQUAMOUS EPITHELIAL CELLS SEEN     MODERATE GRAM NEGATIVE RODS     Performed at [facility]   Culture     Final   Value: ABUNDANT ESCHERICHIA COLI     Note: Confirmed Extended Spectrum  Beta-Lactamase Producer (ESBL) CRITICAL RESULT CALLED TO, READ BACK BY AND VERIFIED WITH: ALBERT R 08/21/13 AT 810 BY [facility]     Performed at [facility]   Report Status 08/21/2013 FINAL   Final   Organism ID, Bacteria ESCHERICHIA COLI   Final  ANAEROBIC CULTURE     Status: None   Collection Time    08/18/13  6:54 PM      Result Value Range Status   Specimen Description ABSCESS LEFT KIDNEY   Final   Special Requests NONE   Final   Gram Stain     Final   Value: ABUNDANT WBC PRESENT,BOTH PMN AND MONONUCLEAR     NO SQUAMOUS EPITHELIAL CELLS SEEN     MODERATE GRAM NEGATIVE RODS     Performed at [facility]   Culture     Final   Value: NO ANAEROBES ISOLATED     Performed at [facility]   Report Status 08/23/2013 FINAL   Final   CULTURE, BLOOD (ROUTINE X 2)     Status: None   Collection Time    08/18/13  7:05 PM      Result Value Range Status   Specimen Description BLOOD RIGHT HAND   Final   Special Requests BOTTLES DRAWN AEROBIC AND ANAEROBIC 10CC   Final   Culture  Setup Time     Final   Value: 08/19/2013 00:10     Performed at [facility]   Culture     Final   Value: NO GROWTH 5 DAYS     Performed at [facility]   Report Status 08/24/2013 FINAL   Final  CULTURE, BLOOD (ROUTINE X 2)     Status: None   Collection Time    08/18/13  9:45 PM      Result Value Range Status   Specimen Description BLOOD LEFT HAND   Final   Special Requests BOTTLES DRAWN AEROBIC AND ANAEROBIC 10CC EA   Final   Culture  Setup Time     Final   Value: 08/19/2013 02:54     Performed at [facility]   Culture     Final   Value: NO GROWTH 5 DAYS     Performed at [facility]   Report Status 08/25/2013 FINAL   Final  CULTURE, BLOOD (ROUTINE X 2)     Status: None   Collection Time    08/20/13  4:40 PM      Result Value Range Status   Specimen Description BLOOD LEFT ARM   Final   Special Requests BOTTLES DRAWN AEROBIC ONLY 3CC   Final   Culture  Setup Time     Final   Value: 08/21/2013 02:13     Performed at [facility]   Culture     Final   Value: NO GROWTH 5 DAYS     Performed at [facility]   Report Status 08/27/2013 FINAL   Final  CULTURE, BLOOD (ROUTINE X 2)     Status: None   Collection Time    08/20/13  4:55 PM      Result Value Range Status   Specimen Description BLOOD RIGHT ARM   Final   Special Requests BOTTLES DRAWN AEROBIC ONLY 10CC   Final   Culture  Setup Time     Final   Value: 08/21/2013 02:13     Performed at [facility]   Culture     Final  Value: NO GROWTH 5 DAYS     Performed at [facility]   Report Status 08/27/2013 FINAL   Final  MRSA PCR SCREENING     Status: None   Collection Time    08/24/13  8:07 PM      Result Value  Range Status   MRSA by PCR NEGATIVE  NEGATIVE Final   Comment:            The GeneXpert MRSA Assay (FDA     approved for NASAL specimens     only), is one component of a     comprehensive MRSA colonization     surveillance program. It is not     intended to diagnose MRSA     infection nor to guide or     monitor treatment for     MRSA infections.  BODY FLUID CULTURE     Status: None   Collection Time    08/25/13  3:36 PM      Result Value Range Status   Specimen Description PERITONEAL CAVITY   Final   Special Requests Normal   Final   Gram Stain     Final   Value: ABUNDANT WBC PRESENT,BOTH PMN AND MONONUCLEAR     NO ORGANISMS SEEN     Performed at [facility]   Culture     Final   Value: NO GROWTH 2 DAYS     Performed at [facility]   Report Status PENDING   Incomplete    Radiology Reports Ct Abdomen Pelvis Wo Contrast  08/24/2013   CLINICAL DATA:  Abdominal pain, sepsis, leukocytosis. "Coffee-ground" fluid draining from NG tube.  EXAM: CT ABDOMEN AND PELVIS WITHOUT CONTRAST  TECHNIQUE: Multidetector CT imaging of the abdomen and pelvis was performed following the standard protocol without intravenous contrast.  COMPARISON:  [facility] CT abdomen pelvis dated 08/18/2013  FINDINGS: Motion degraded images.  Small right pleural effusion. Associated lower lobe atelectasis bilaterally.  Unenhanced liver, pancreas, and adrenal glands are within normal limits.  Numerous splenic granulomata.  Interval placement of a left percutaneous nephrostomy catheter with decompression of the prior left hydronephrosis. Bilateral renal cysts, poorly evaluated in the absence of intravenous contrast. Stable 10 mm proximal left ureteral calculus (series 2/ image 64).  Atherosclerotic calcifications of the abdominal aorta and branch vessels. Aorto bi-iliac stent graft.  Moderate abdominopelvic ascites, new.  Interval development of moderate free intraperitoneal gas. This would not be accounted for  by the nephrostomy placement, which was retroperitoneal. In the absence of interval intraperitoneal procedure, this is worrisome for a perforated hollow viscus. Given the presence of inflammatory stranding (series 2/image 36) and localized gas near the gastric antrum (series 2/image 32), a perforated gastric ulcer is considered.  No suspicious abdominopelvic lymphadenopathy.  Prostate is unremarkable.  Mildly thick-walled bladder (Series 2/ image 99).  Mild degenerative changes of the visualized thoracolumbar spine.  IMPRESSION: Interval development of moderate free intraperitoneal gas, suggesting a perforated hollow viscus. Although the exact etiology is unclear, a perforated gastric ulcer is considered.  Moderate abdominopelvic ascites, new.  Interval placement of a left percutaneous nephrostomy catheter with decompression of prior left hydronephrosis. Stable 10 mm proximal left ureteral calculus.  Mildly thick-walled bladder, correlate for cystitis.  Critical value/emergent results were called by telephone at the time of interpretation on 08/24/2013 at 12:40 PM to Dr.JESSICA [facility] , who verbally acknowledged these results.   Electronically Signed   By: Charline Bills M.D.   On: 08/24/2013 12:44  Dg Chest 1 View  08/24/2013   CLINICAL DATA:  Decreasing oxygen saturation, abdominal pain  EXAM: CHEST - 1 VIEW  COMPARISON:  08/18/2013; 08/17/2013; CT abdomen pelvis -08/24/2013  FINDINGS: Grossly unchanged borderline enlarged cardiac silhouette and mediastinal contours. Interval placement of enteric tube tip and side port projecting below the left hemidiaphragm. Interval removal of left jugular approach central venous catheter. Overall improved aeration of the lungs. There is persistent mild elevation of the right hemidiaphragm and minimal bibasilar opacities, right greater than left. No new focal airspace opacities. Trace right-sided effusion is not excluded. No pneumothorax. Unchanged bones.  IMPRESSION: 1.  Overall improved aeration of the lungs suggests resolving edema and/or atelectasis. 2. Persistent mild elevation of the right hemidiaphragm with bibasilar opacities, right greater than left, likely atelectasis. No new focal airspace opacities. 3. Enteric tube tip and side port projects below the left hemidiaphragm.   Electronically Signed   By: Simonne Come M.D.   On: 08/24/2013 12:50   Ir Perc Nephrostomy Left  08/19/2013   CLINICAL DATA:  Septic shock, gram negative bacteremia and left hydronephrosis secondary to an obstructing ureteral calculus.  EXAM: 1. ULTRASOUND GUIDANCE FOR PUNCTURE OF THE LEFT RENAL COLLECTING SYSTEM. 2. LEFT PERCUTANEOUS NEPHROSTOMY TUBE PLACEMENT.  MEDICATIONS: No additional medications were administered.  ANESTHESIA/SEDATION: 1.0 mg IV Versed; 25 mcg IV Fentanyl.  Total Moderate Sedation Time  ONE 3 minutes  CONTRAST:  10 ml Omnipaque 300  COMPARISON:  None.  FLUOROSCOPY TIME:  30 seconds.  PROCEDURE: The procedure, risks, benefits, and alternatives were explained to the patient. Questions regarding the procedure were encouraged and answered. The patient understands and consents to the procedure.  The left flank region was prepped with Betadine in a sterile fashion, and a sterile drape was applied covering the operative field. A sterile gown and sterile gloves were used for the procedure. Local anesthesia was provided with 1% Lidocaine.  Ultrasound was used to localize the left kidney. Under direct ultrasound guidance, a 21 gauge needle was advanced into the renal collecting system. Ultrasound image documentation was performed. Aspiration of urine sample was performed followed by contrast injection. A sample of aspirated urine was sent for culture analysis.  A transitional dilator was advanced over a guidewire. Percutaneous tract dilatation was then performed over the guidewire. A 10 -French percutaneous nephrostomy tube was then advanced and formed in the collecting system. Catheter  position was confirmed by fluoroscopy after contrast injection.  The catheter was secured at the skin with a Prolene retention suture and Stat-Lock device. A gravity bag was placed.  COMPLICATIONS: None.  FINDINGS: Ultrasound confirms significant left hydronephrosis. After needle puncture, there was immediate return of overtly purulent fluid consistent with pyonephrosis. After placement of the 10 French nephrostomy tubes, there is rapid drainage of purulent fluid.  IMPRESSION: Left percutaneous nephrostomy tube placement. There was evidence of overt pyonephrosis. A fluid sample was sent for culture analysis. A 10 French nephrostomy tube was placed, formed in the renal pelvis and connected to gravity bag drainage.   Electronically Signed   By: Irish Lack M.D.   On: 08/19/2013 19:58   Ir US Guide Bx Asp/drain  08/19/2013   CLINICAL DATA:  Septic shock, gram negative bacteremia and left hydronephrosis secondary to an obstructing ureteral calculus.  EXAM: 1. ULTRASOUND GUIDANCE FOR PUNCTURE OF THE LEFT RENAL COLLECTING SYSTEM. 2. LEFT PERCUTANEOUS NEPHROSTOMY TUBE PLACEMENT.  MEDICATIONS: No additional medications were administered.  ANESTHESIA/SEDATION: 1.0 mg IV Versed; 25 mcg IV Fentanyl.  Total  Moderate Sedation Time  ONE 3 minutes  CONTRAST:  10 ml Omnipaque 300  COMPARISON:  None.  FLUOROSCOPY TIME:  30 seconds.  PROCEDURE: The procedure, risks, benefits, and alternatives were explained to the patient. Questions regarding the procedure were encouraged and answered. The patient understands and consents to the procedure.  The left flank region was prepped with Betadine in a sterile fashion, and a sterile drape was applied covering the operative field. A sterile gown and sterile gloves were used for the procedure. Local anesthesia was provided with 1% Lidocaine.  Ultrasound was used to localize the left kidney. Under direct ultrasound guidance, a 21 gauge needle was advanced into the renal collecting system.  Ultrasound image documentation was performed. Aspiration of urine sample was performed followed by contrast injection. A sample of aspirated urine was sent for culture analysis.  A transitional dilator was advanced over a guidewire. Percutaneous tract dilatation was then performed over the guidewire. A 10 -French percutaneous nephrostomy tube was then advanced and formed in the collecting system. Catheter position was confirmed by fluoroscopy after contrast injection.  The catheter was secured at the skin with a Prolene retention suture and Stat-Lock device. A gravity bag was placed.  COMPLICATIONS: None.  FINDINGS: Ultrasound confirms significant left hydronephrosis. After needle puncture, there was immediate return of overtly purulent fluid consistent with pyonephrosis. After placement of the 10 French nephrostomy tubes, there is rapid drainage of purulent fluid.  IMPRESSION: Left percutaneous nephrostomy tube placement. There was evidence of overt pyonephrosis. A fluid sample was sent for culture analysis. A 10 French nephrostomy tube was placed, formed in the renal pelvis and connected to gravity bag drainage.   Electronically Signed   By: Irish Lack M.D.   On: 08/19/2013 19:58   Dg Chest Port 1 View  08/26/2013   CLINICAL DATA:  Endotracheal tube removal  EXAM: PORTABLE CHEST - 1 VIEW  COMPARISON:  08/24/2013, 08/18/2013  FINDINGS: There has been interval removal of the endotracheal tube. There is a nasogastric tube in unchanged position. There is elevation of the right diaphragm. There is left basilar atelectasis versus scarring. There is no focal consolidation, pleural effusion or pneumothorax. Normal cardiomediastinal silhouette. The osseous structures are unremarkable.  IMPRESSION: Interval removal of the endotracheal tube. Otherwise no significant interval change.   Electronically Signed   By: Elige Ko   On: 08/26/2013 08:15   Portable Chest Xray  08/24/2013   CLINICAL DATA:  Endotracheal  tube placement.  EXAM: PORTABLE CHEST - 1 VIEW  COMPARISON:  08/24/2013 at 1143 hr wound.  FINDINGS: Endotracheal tube is been placed with tip measuring 4.7 cm above the carinal. Enteric tube remains in place without evidence of change. The tip is off the field of view. There is shallow inspiration with linear atelectasis developing in both lung bases. Probable small right pleural effusion. Normal heart size and pulmonary vascularity. No pneumothorax.  IMPRESSION: Endotracheal tube tip measures about 4.7 cm above the carina. Increasing atelectasis in the lung bases. Probable small right pleural effusion.   Electronically Signed   By: Burman Nieves M.D.   On: 08/24/2013 22:21   Dg Chest Port 1 View  08/18/2013   CLINICAL DATA:  Central line placement  EXAM: PORTABLE CHEST - 1 VIEW  COMPARISON:  08/17/2013  FINDINGS: Left internal jugular central line has been placed with tip over the anticipated position of the junction of the brachiocephalic vein and superior vena cava. No pneumothorax. Heart size is normal. Lungs are clear.  IMPRESSION: Central line as described above with no pneumothorax   Electronically Signed   By: Esperanza Heir M.D.   On: 08/18/2013 20:58   Dg Abd 2 Views  08/22/2013   CLINICAL DATA:  Generalized abdominal pain  EXAM: ABDOMEN - 2 VIEW  COMPARISON:  08/18/2013  FINDINGS: There is nonobstructive bowel gas pattern. 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 Left nephrostomy catheter. No free abdominal air. Mild gaseous distended small bowel loops mid abdomen probable mild ileus.  IMPRESSION: Mild gaseous distended small bowel loops mid abdomen probable mild ileus. No air-fluid levels. Left nephrostomy catheter. No free abdominal air.   Electronically Signed   By: Natasha Mead M.D.   On: 08/22/2013 12:18   Dg Kayleen Memos W/water Sol Cm  08/28/2013   CLINICAL DATA:  Evaluate duodenal leak following Cheree Ditto patch placement.  EXAM: WATER SOLUBLE UPPER GI SERIES  TECHNIQUE:  Single-column upper GI series was performed using water soluble contrast.  CONTRAST:  150 mL Omnipaque 300  COMPARISON:  None.  FLUOROSCOPY TIME:  52 seconds  FINDINGS: A preprocedural KUB was performed demonstrating the nasogastric tube with the tip projecting over the stomach, but the proximal port is proximal to the esophagogastric junction. Recommend advancing the nasogastric tube 10 cm.  150 mL Omnipaque 300 was hand injected through an existing nasogastric tube. Following injection of contrast the stomach and duodenum was evaluated under realtime fluoroscopy. The patient was placed in the supine and right and left decubitus positions. Contrast is observed in the gastric antrum traversing the duodenum and into the proximal jejunum. There is no extravasation of contrast. Following the fluoroscopic evaluation a postprocedural KUB was performed demonstrating no extraluminal contrast.  There is reflux of contrast into the esophagus.  IMPRESSION: No extraluminal contrast identified to suggest a gastric or duodenal leak.  Nasogastric tube with the tip projecting over the stomach, but the proximal port is proximal to the esophagogastric junction. Recommend advancing the nasogastric tube 10 cm.   Electronically Signed   By: Elige Ko   On: 08/28/2013 09:09    CBC  Recent Labs Lab 08/23/13 1818 08/24/13 0701 08/25/13 0404 08/26/13 0421 08/28/13 0500  WBC 44.0* 40.5* 26.6* 15.6* 10.2  HGB 11.8* 11.4* 9.4* 9.3* 9.1*  HCT 34.1* 32.9* 27.5* 27.4* 28.2*  PLT 328 331 321 328 285  MCV 85.7 85.2 86.8 87.0 89.5  MCH 29.6 29.5 29.7 29.5 28.9  MCHC 34.6 34.7 34.2 33.9 32.3  RDW 17.7* 18.0* 18.3* 18.4* 18.9*  LYMPHSABS  --  2.8  --   --   --   MONOABS  --  0.8  --   --   --   EOSABS  --  0.0  --   --   --   BASOSABS  --  0.0  --   --   --     Chemistries   Recent Labs Lab 08/23/13 0930 08/24/13 0701 08/25/13 0404 08/26/13 0421 08/28/13 0858  NA 147* 151* 148* 154* 153*  K 4.8 4.9 4.9 4.5 4.3   CL 119* 120* 119* 126* 125*  CO2 14* 17* 14* 14* 18*  GLUCOSE 122* 96 99 112* 106*  BUN 73* 98* 102* 95* 58*  CREATININE 2.80* 4.44* 4.59* 4.07* 2.42*  CALCIUM 8.6 8.5 7.4* 7.7* 8.1*   ------------------------------------------------------------------------------------------------------------------ estimated creatinine clearance is 37.1 ml/min (by C-G formula based on Cr of 2.42). ------------------------------------------------------------------------------------------------------------------ No results found for this basename: HGBA1C,  in the last 72 hours ------------------------------------------------------------------------------------------------------------------ No results found  for this basename: CHOL, HDL, LDLCALC, TRIG, CHOLHDL, LDLDIRECT,  in the last 72 hours ------------------------------------------------------------------------------------------------------------------ No results found for this basename: TSH, T4TOTAL, FREET3, T3FREE, THYROIDAB,  in the last 72 hours ------------------------------------------------------------------------------------------------------------------ No results found for this basename: VITAMINB12, FOLATE, FERRITIN, TIBC, IRON, RETICCTPCT,  in the last 72 hours  Coagulation profile No results found for this basename: INR, PROTIME,  in the last 168 hours  No results found for this basename: DDIMER,  in the last 72 hours  Cardiac Enzymes No results found for this basename: CK, CKMB, TROPONINI, MYOGLOBIN,  in the last 168 hours ------------------------------------------------------------------------------------------------------------------ No components found with this basename: POCBNP,

## 2013-08-28 NOTE — Progress Notes (Signed)
Pt tolerating sips of clears. Denies nausea or discomfort

## 2013-08-28 NOTE — Progress Notes (Signed)
INITIAL NUTRITION ASSESSMENT  DOCUMENTATION CODES Per approved criteria  -Not Applicable   INTERVENTION: 1.  Modify diet; resume PO diet once medically appropriate per MD discretion. Consider nutrition support if pt unable to advance diet within 24-48 hrs.   NUTRITION DIAGNOSIS: Inadequate oral intake related to omission of energy dense foods as evidenced by NPO/CL.   Monitor:  1.  Food/Beverage; pt meeting >/=90% estimated needs with tolerance. 2.  Wt/wt change; monitor trends  Reason for Assessment: NPO/clear liquids x7 days  67 y.o. male  Admitting Dx: Severe sepsis  ASSESSMENT: Pt transferred [facility] hospital with hematuria and UTI with severe left-sided hydronephrosis and need for nephrostomy tube.  Pt developed ESBL bacteremia renal abscess, septic shock.  Now with perforated visus s/p emergent ex lap for perforated duodenal ulcer with free air.  NGT removed today and diet advanced to sips/clears. Pt has largely been NPO since admission.   Height: Ht Readings from Last 1 Encounters:  08/24/13 5\' 11"  (1.803 m)    Weight: Wt Readings from Last 1 Encounters:  08/24/13 239 lb (108.41 kg)    Ideal Body Weight: 78.1 kg  % Ideal Body Weight: 138%  Wt Readings from Last 10 Encounters:  08/24/13 239 lb (108.41 kg)  08/24/13 239 lb (108.41 kg)  08/29/11 254 lb 8 oz (115.44 kg)    Usual Body Weight: 240 lbs  % Usual Body Weight: 100%  BMI:  Body mass index is 33.35 kg/(m^2).  Estimated Nutritional Needs: Kcal: 1950-2180 Protein: 110-125g Fluid: >2.0 L/day  Skin: non-pitting edema  Diet Order: NPO  EDUCATION NEEDS: -No education needs identified at this time   Intake/Output Summary (Last 24 hours) at 08/28/13 1321 Last data filed at 08/28/13 1300  Gross per 24 hour  Intake 3863.75 ml  Output   5355 ml  Net -1491.25 ml    Last BM: 11/23  Labs:   Recent Labs Lab 08/25/13 0404 08/26/13 0421 08/28/13 0858  NA 148* 154* 153*  K 4.9 4.5  4.3  CL 119* 126* 125*  CO2 14* 14* 18*  BUN 102* 95* 58*  CREATININE 4.59* 4.07* 2.42*  CALCIUM 7.4* 7.7* 8.1*  GLUCOSE 99 112* 106*    CBG (last 3)  No results found for this basename: GLUCAP,  in the last 72 hours  Scheduled Meds: . antiseptic oral rinse  15 mL Mouth Rinse QID  . enoxaparin (LOVENOX) injection  30 mg Subcutaneous Q24H  . fluconazole (DIFLUCAN) IV  200 mg Intravenous Q24H  . imipenem-cilastatin  250 mg Intravenous Q6H  . pantoprazole (PROTONIX) IV  40 mg Intravenous Q12H    Continuous Infusions: . dextrose 125 mL/hr at 08/28/13 1000    Past Medical History  Diagnosis Date  . Arthritis   . Degenerative disc disease   . Degenerative joint disease   . Aortic aneurysm   . Pneumonia   . Hypercholesterolemia   . Hypertension   . CKD (chronic kidney disease), stage III     Past Surgical History  Procedure Laterality Date  . Abdominal aortic aneurysm repair  2011  . Hernia repair      51mo old  . Tonsillectomy    . Umbilical hernia repair      50 yr ago  . Laparotomy N/A 08/24/2013    Procedure: EXPLORATORY LAPAROTOMY;  Surgeon: Robyne Askew, MD;  Location: [facility] OR;  Service: General;  Laterality: N/A;  Gram patch closure    Loyce Dys, MS RD LDN Clinical Inpatient Dietitian Pager:  [phone] Weekend/After hours pager: [phone]

## 2013-08-28 NOTE — Evaluation (Signed)
Physical Therapy Evaluation Patient Details Name: Nathan Rivera MRN: [id number] DOB: 06-18-1946 Today's Date: 08/28/2013 Time: 4098-1191 PT Time Calculation (min): 17 min  PT Assessment / Plan / Recommendation History of Present Illness  pt presents with perforated duodenal ulcer, sepsis, renal abscess, and hydronephrosis.    Clinical Impression  Pt generally weak and deconditioned, but agreeable to OOB with PT.  Encouraged pt to amb multiple times per day with Nsg staff to increase overall activity.  Will continue to follow.      PT Assessment  Patient needs continued PT services    Follow Up Recommendations  Home health PT;Supervision/Assistance - 24 hour    Does the patient have the potential to tolerate intense rehabilitation      Barriers to Discharge        Equipment Recommendations  Rolling walker with 5" wheels (family to check if they have one.  )    Recommendations for Other Services     Frequency Min 3X/week    Precautions / Restrictions Precautions Precautions: Fall Restrictions Weight Bearing Restrictions: No   Pertinent Vitals/Pain Incisional pin per pt.  Denied need for pain meds.        Mobility  Bed Mobility Bed Mobility: Rolling Right;Right Sidelying to Sit;Sitting - Scoot to Edge of Bed Rolling Right: 5: Set up;With rail Right Sidelying to Sit: 4: Min guard;With rails Sitting - Scoot to Edge of Bed: 4: Min guard Details for Bed Mobility Assistance: cues for log roll to minimize strain on abdomen.   Transfers Transfers: Sit to Stand;Stand to Sit Sit to Stand: 4: Min guard;With upper extremity assist;From bed Stand to Sit: 4: Min guard;With upper extremity assist;To chair/3-in-1 Details for Transfer Assistance: cues for UE use and controlling descent to chair.   Ambulation/Gait Ambulation/Gait Assistance: 4: Min guard Ambulation Distance (Feet): 80 Feet Assistive device: Rolling walker Ambulation/Gait Assistance Details: cues for upright posture  and encouragement.   Gait Pattern: Step-through pattern;Decreased stride length;Trunk flexed Stairs: No Wheelchair Mobility Wheelchair Mobility: No    Exercises     PT Diagnosis: Difficulty walking;Acute pain  PT Problem List: Decreased strength;Decreased activity tolerance;Decreased balance;Decreased mobility;Decreased knowledge of use of DME;Pain PT Treatment Interventions: DME instruction;Gait training;Stair training;Functional mobility training;Therapeutic activities;Therapeutic exercise;Balance training;Neuromuscular re-education;Patient/family education     PT Goals(Current goals can be found in the care plan section) Acute Rehab PT Goals Patient Stated Goal: to go home  PT Goal Formulation: With patient Time For Goal Achievement: 09/11/13 Potential to Achieve Goals: Good  Visit Information  Last PT Received On: 08/28/13 Assistance Needed: +1 History of Present Illness: pt presents with perforated duodenal ulcer, sepsis, renal abscess, and hydronephrosis.         Prior Functioning  Home Living Family/patient expects to be discharged to:: Private residence Living Arrangements: Spouse/significant other Available Help at Discharge: Family;Available 24 hours/day Type of Home: House Home Access: Stairs to enter [employer] of Steps: 3 Entrance Stairs-Rails: None Home Layout: One level Home Equipment: Walker - 2 wheels;Other (comment);Cane - single point;Shower seat Additional Comments: pt has walk in shower and handicap height toilet seats Prior Function Level of Independence: Independent Comments: pt drives and is independent  Communication Communication: No difficulties    Cognition  Cognition Arousal/Alertness: Awake/alert Behavior During Therapy: WFL for tasks assessed/performed Overall Cognitive Status: Within Functional Limits for tasks assessed    Extremity/Trunk Assessment Upper Extremity Assessment Upper Extremity Assessment: Generalized  weakness Lower Extremity Assessment Lower Extremity Assessment: Generalized weakness Cervical / Trunk Assessment Cervical / Trunk  Assessment: Normal   Balance Balance Balance Assessed: No  End of Session PT - End of Session Equipment Utilized During Treatment: Gait belt Activity Tolerance: Patient tolerated treatment well Patient left: in chair;with call bell/phone within reach Nurse Communication: Mobility status  GP     Sunny Schlein, [place] [phone] 08/28/2013, 11:20 AM

## 2013-08-28 NOTE — Progress Notes (Signed)
Agree with A&P of ER,NP. He is feeling better, I reviwed todays UGI and there is no evidence of leak and good emptyinig of stomach with no evidence of narrowing at site of perforation. Will get NG out today

## 2013-08-28 NOTE — Progress Notes (Signed)
4 Days Post-Op  Subjective: Denies n/v.  Some abdominal pain, overall better.  +flatus, no BM.   Objective: Vital signs in last 24 hours: Temp:  [97.5 F (36.4 C)-98.2 F (36.8 C)] 98.2 F (36.8 C) (12/03 0647) Pulse Rate:  [77-89] 77 (12/03 0647) Resp:  [16-17] 16 (12/03 0647) BP: (122-143)/(76-79) 122/76 mmHg (12/03 0647) SpO2:  [95 %-100 %] 97 % (12/03 0647) Last BM Date: 08/18/13  Intake/Output from previous day: 12/02 0701 - 12/03 0700 In: 3313.8 [I.V.:2813.8; IV Piggyback:500] Out: 4865 [Urine:2925; Emesis/NG output:1600; Drains:340] Intake/Output this shift: Total I/O In: -  Out: 460 [Urine:200; Emesis/NG output:200; Drains:60]  PE:  Gen: Alert, NAD, pleasant  Abd: Soft, non distended, incision is TTP, bowel sounds are present, no HSM, midline skin wound clean, RLQ drain with serous drainage. LLQ drain with serous output    Lab Results:   Recent Labs  08/26/13 0421 08/28/13 0500  WBC 15.6* 10.2  HGB 9.3* 9.1*  HCT 27.4* 28.2*  PLT 328 285   BMET  Recent Labs  08/26/13 0421  NA 154*  K 4.5  CL 126*  CO2 14*  GLUCOSE 112*  BUN 95*  CREATININE 4.07*  CALCIUM 7.7*   PT/INR No results found for this basename: LABPROT, INR,  in the last 72 hours ABG No results found for this basename: PHART, PCO2, PO2, HCO3,  in the last 72 hours  Studies/Results: Dg Ugi W/water Sol Cm  08/28/2013   CLINICAL DATA:  Evaluate duodenal leak following Cheree Ditto patch placement.  EXAM: WATER SOLUBLE UPPER GI SERIES  TECHNIQUE: Single-column upper GI series was performed using water soluble contrast.  CONTRAST:  150 mL Omnipaque 300  COMPARISON:  None.  FLUOROSCOPY TIME:  52 seconds  FINDINGS: A preprocedural KUB was performed demonstrating the nasogastric tube with the tip projecting over the stomach, but the proximal port is proximal to the esophagogastric junction. Recommend advancing the nasogastric tube 10 cm.  150 mL Omnipaque 300 was hand injected through an existing  nasogastric tube. Following injection of contrast the stomach and duodenum was evaluated under realtime fluoroscopy. The patient was placed in the supine and right and left decubitus positions. Contrast is observed in the gastric antrum traversing the duodenum and into the proximal jejunum. There is no extravasation of contrast. Following the fluoroscopic evaluation a postprocedural KUB was performed demonstrating no extraluminal contrast.  There is reflux of contrast into the esophagus.  IMPRESSION: No extraluminal contrast identified to suggest a gastric or duodenal leak.  Nasogastric tube with the tip projecting over the stomach, but the proximal port is proximal to the esophagogastric junction. Recommend advancing the nasogastric tube 10 cm.   Electronically Signed   By: Elige Ko   On: 08/28/2013 09:09    Anti-infectives: Anti-infectives   Start     Dose/Rate Route Frequency Ordered Stop   08/27/13 0200  imipenem-cilastatin (PRIMAXIN) 250 mg in sodium chloride 0.9 % 100 mL IVPB     250 mg 200 mL/hr over 30 Minutes Intravenous Every 6 hours 08/27/13 0051     08/26/13 1400  vancomycin (VANCOCIN) 1,500 mg in sodium chloride 0.9 % 500 mL IVPB  Status:  Discontinued     1,500 mg 250 mL/hr over 120 Minutes Intravenous Every 48 hours 08/24/13 1301 08/24/13 2004   08/25/13 1800  imipenem-cilastatin (PRIMAXIN) 250 mg in sodium chloride 0.9 % 100 mL IVPB  Status:  Discontinued     250 mg 200 mL/hr over 30 Minutes Intravenous Every 12 hours 08/24/13  1245 08/27/13 0051   08/24/13 2345  fluconazole (DIFLUCAN) IVPB 200 mg     200 mg 100 mL/hr over 60 Minutes Intravenous Every 24 hours 08/24/13 2332     08/24/13 1315  vancomycin (VANCOCIN) 2,250 mg in sodium chloride 0.9 % 500 mL IVPB  Status:  Discontinued     2,250 mg 250 mL/hr over 120 Minutes Intravenous  Once 08/24/13 1301 08/24/13 2004   08/20/13 1200  imipenem-cilastatin (PRIMAXIN) 250 mg in sodium chloride 0.9 % 100 mL IVPB  Status:   Discontinued     250 mg 200 mL/hr over 30 Minutes Intravenous 4 times per day 08/20/13 1132 08/24/13 1245   08/18/13 2200  ceFEPIme (MAXIPIME) 1 g in dextrose 5 % 50 mL IVPB  Status:  Discontinued     1 g 100 mL/hr over 30 Minutes Intravenous Every 12 hours 08/18/13 1846 08/18/13 1847   08/18/13 2000  ceFEPIme (MAXIPIME) 1 g in dextrose 5 % 50 mL IVPB  Status:  Discontinued     1 g 100 mL/hr over 30 Minutes Intravenous Every 24 hours 08/18/13 1728 08/18/13 1846   08/18/13 1900  ceFEPIme (MAXIPIME) 1 g in dextrose 5 % 50 mL IVPB  Status:  Discontinued     1 g 100 mL/hr over 30 Minutes Intravenous Every 24 hours 08/18/13 1847 08/20/13 1132      Assessment/Plan: Perforated duodenal ulcerwith free air - POD #3 s/p laparotomy and graham patch closure of perforated duodenal ulcer and placement of JP drains  Hydronephrosis (left)/renal abscess s/p perc nephro tube  Bacteremia (ESBL ecoli) and severe sepsis for UTI  ARF on chronic renal failure  PVD  HTN  Leukocytosis  ABL Anemia - stable  Plan:  1. Cont primaxin and diflucan, recommend 7 days of therapy  2. UGI negative for a leak.  NG output is likely due to water/ice chips and placement in the duodenum.  DC NGT, may have sips of clears.  3. Ambulate OOB and IS  4. SCD's, start lovenox, and cont PPI  5. Continue drain, monitor output  6. BID wet to dry dressing changes    LOS: 10 days    Virdell Hoiland  ANP-BC  08/28/2013 10:02 AM

## 2013-08-29 DIAGNOSIS — E785 Hyperlipidemia, unspecified: Secondary | ICD-10-CM

## 2013-08-29 LAB — BASIC METABOLIC PANEL
Chloride: 119 mEq/L — ABNORMAL HIGH (ref 96–112)
Creatinine, Ser: 1.96 mg/dL — ABNORMAL HIGH (ref 0.50–1.35)
GFR calc Af Amer: 39 mL/min — ABNORMAL LOW (ref >90)
Potassium: 3.8 mEq/L (ref 3.5–5.1)
Sodium: 147 mEq/L — ABNORMAL HIGH (ref 135–145)

## 2013-08-29 LAB — BODY FLUID CULTURE
Culture: NO GROWTH
Special Requests: NORMAL

## 2013-08-29 MED ORDER — KCL IN DEXTROSE-NACL 20-5-0.45 MEQ/L-%-% IV SOLN
INTRAVENOUS | Status: DC
  Administered 2013-08-29: 11:00:00 via INTRAVENOUS
  Administered 2013-08-30: 75 mL/h via INTRAVENOUS
  Administered 2013-08-30: 19:00:00 via INTRAVENOUS
  Administered 2013-08-31: 75 mL/h via INTRAVENOUS
  Administered 2013-09-01 – 2013-09-03 (×4): via INTRAVENOUS
  Filled 2013-08-29 (×13): qty 1000

## 2013-08-29 MED ORDER — ENOXAPARIN SODIUM 40 MG/0.4ML ~~LOC~~ SOLN
40.0000 mg | Freq: Every day | SUBCUTANEOUS | Status: DC
  Administered 2013-08-29 – 2013-09-04 (×6): 40 mg via SUBCUTANEOUS
  Filled 2013-08-29 (×7): qty 0.4

## 2013-08-29 MED ORDER — SODIUM CHLORIDE 0.9 % IV SOLN
500.0000 mg | Freq: Three times a day (TID) | INTRAVENOUS | Status: DC
  Administered 2013-08-29 – 2013-09-04 (×19): 500 mg via INTRAVENOUS
  Filled 2013-08-29 (×21): qty 500

## 2013-08-29 NOTE — Progress Notes (Signed)
5 Days Post-Op   Assessment: s/p Procedure(s): EXPLORATORY LAPAROTOMY Patient Active Problem List   Diagnosis Date Noted  . Renal abscess 08/20/2013  . Acute respiratory failure with hypoxia 08/20/2013  . Dehydration 08/20/2013  . Bacteremia due to ESBL Escherichia coli 08/20/2013  . Severe sepsis 08/18/2013  . Acute renal failure 08/18/2013  . Hypotension, unspecified 08/18/2013  . Hydronephrosis, left 08/18/2013  . Nephrolithiasis 08/18/2013  . PVD (peripheral vascular disease) 08/29/2011  . Hypertension 08/29/2011  . Abnormal EKG 08/29/2011  . Preoperative cardiovascular examination 08/29/2011  . Hyperlipidemia 08/29/2011  . Malaise 08/29/2011    Improving slowly  Plan: Advance diet Remove left JP today Slow IVF  Subjective: Feels OK, just tired. Didn't sleep well due to beeping from IV pump  Objective: Vital signs in last 24 hours: Temp:  [97.5 F (36.4 C)-98.2 F (36.8 C)] 98 F (36.7 C) (12/04 0508) Pulse Rate:  [83-89] 83 (12/04 0508) Resp:  [18] 18 (12/04 0508) BP: (121-136)/(71-78) 136/75 mmHg (12/04 0508) SpO2:  [98 %-99 %] 98 % (12/04 0508)   Intake/Output from previous day: 12/03 0701 - 12/04 0700 In: 3440 [P.O.:240; I.V.:3000; IV Piggyback:200] Out: 4295 [Urine:3525; Emesis/NG output:500; Drains:270]  General appearance: alert, cooperative and no distress Resp: clear to auscultation bilaterally GI: Abd soft and not tender. BS+, JP's thin and slowing down  Incision: healing well, Being repacked daily  Lab Results:   Recent Labs  08/28/13 0500  WBC 10.2  HGB 9.1*  HCT 28.2*  PLT 285   BMET  Recent Labs  08/28/13 0858 08/29/13 0534  NA 153* 147*  K 4.3 3.8  CL 125* 119*  CO2 18* 18*  GLUCOSE 106* 96  BUN 58* 43*  CREATININE 2.42* 1.96*  CALCIUM 8.1* 7.4*    MEDS, Scheduled . antiseptic oral rinse  15 mL Mouth Rinse QID  . enoxaparin (LOVENOX) injection  40 mg Subcutaneous Daily  . fluconazole (DIFLUCAN) IV  200 mg  Intravenous Q24H  . imipenem-cilastatin  500 mg Intravenous Q8H  . pantoprazole (PROTONIX) IV  40 mg Intravenous Q12H    Studies/Results: Dg Ugi W/water Sol Cm  08/28/2013   CLINICAL DATA:  Evaluate duodenal leak following Nathan Rivera.  EXAM: WATER SOLUBLE UPPER GI SERIES  TECHNIQUE: Single-column upper GI series was performed using water soluble contrast.  CONTRAST:  150 mL Omnipaque 300  COMPARISON:  None.  FLUOROSCOPY TIME:  52 seconds  FINDINGS: A preprocedural KUB was performed demonstrating the nasogastric tube with the tip projecting over the stomach, but the proximal port is proximal to the esophagogastric junction. Recommend advancing the nasogastric tube 10 cm.  150 mL Omnipaque 300 was hand injected through an existing nasogastric tube. Following injection of contrast the stomach and duodenum was evaluated under realtime fluoroscopy. The patient was placed in the supine and right and left decubitus positions. Contrast is observed in the gastric antrum traversing the duodenum and into the proximal jejunum. There is no extravasation of contrast. Following the fluoroscopic evaluation a postprocedural KUB was performed demonstrating no extraluminal contrast.  There is reflux of contrast into the esophagus.  IMPRESSION: No extraluminal contrast identified to suggest a gastric or duodenal leak.  Nasogastric tube with the tip projecting over the stomach, but the proximal port is proximal to the esophagogastric junction. Recommend advancing the nasogastric tube 10 cm.   Electronically Signed   By: Elige Ko   On: 08/28/2013 09:09      LOS: 11 days     Nathan Rivera J.  Jamey Ripa, MD, [facility], [place] [phone]   08/29/2013 9:02 AM

## 2013-08-29 NOTE — Progress Notes (Signed)
INFECTIOUS DISEASE PROGRESS NOTE  ID: Nathan Rivera is a 67 y.o. male with  Principal Problem:   Severe sepsis Active Problems:   PVD (peripheral vascular disease)   Hypertension   Acute renal failure   Hydronephrosis, left   Renal abscess   Acute respiratory failure with hypoxia   Dehydration   Bacteremia due to ESBL Escherichia coli  Subjective: Tired, up last night with IV beeping.  Abtx:  Anti-infectives   Start     Dose/Rate Route Frequency Ordered Stop   08/29/13 1230  imipenem-cilastatin (PRIMAXIN) 500 mg in sodium chloride 0.9 % 100 mL IVPB     500 mg 200 mL/hr over 30 Minutes Intravenous Every 8 hours 08/29/13 0802     08/27/13 0200  imipenem-cilastatin (PRIMAXIN) 250 mg in sodium chloride 0.9 % 100 mL IVPB  Status:  Discontinued     250 mg 200 mL/hr over 30 Minutes Intravenous Every 6 hours 08/27/13 0051 08/29/13 0802   08/26/13 1400  vancomycin (VANCOCIN) 1,500 mg in sodium chloride 0.9 % 500 mL IVPB  Status:  Discontinued     1,500 mg 250 mL/hr over 120 Minutes Intravenous Every 48 hours 08/24/13 1301 08/24/13 2004   08/25/13 1800  imipenem-cilastatin (PRIMAXIN) 250 mg in sodium chloride 0.9 % 100 mL IVPB  Status:  Discontinued     250 mg 200 mL/hr over 30 Minutes Intravenous Every 12 hours 08/24/13 1245 08/27/13 0051   08/24/13 2345  fluconazole (DIFLUCAN) IVPB 200 mg     200 mg 100 mL/hr over 60 Minutes Intravenous Every 24 hours 08/24/13 2332     08/24/13 1315  vancomycin (VANCOCIN) 2,250 mg in sodium chloride 0.9 % 500 mL IVPB  Status:  Discontinued     2,250 mg 250 mL/hr over 120 Minutes Intravenous  Once 08/24/13 1301 08/24/13 2004   08/20/13 1200  imipenem-cilastatin (PRIMAXIN) 250 mg in sodium chloride 0.9 % 100 mL IVPB  Status:  Discontinued     250 mg 200 mL/hr over 30 Minutes Intravenous 4 times per day 08/20/13 1132 08/24/13 1245   08/18/13 2200  ceFEPIme (MAXIPIME) 1 g in dextrose 5 % 50 mL IVPB  Status:  Discontinued     1 g 100 mL/hr over 30  Minutes Intravenous Every 12 hours 08/18/13 1846 08/18/13 1847   08/18/13 2000  ceFEPIme (MAXIPIME) 1 g in dextrose 5 % 50 mL IVPB  Status:  Discontinued     1 g 100 mL/hr over 30 Minutes Intravenous Every 24 hours 08/18/13 1728 08/18/13 1846   08/18/13 1900  ceFEPIme (MAXIPIME) 1 g in dextrose 5 % 50 mL IVPB  Status:  Discontinued     1 g 100 mL/hr over 30 Minutes Intravenous Every 24 hours 08/18/13 1847 08/20/13 1132      Medications:  Scheduled: . antiseptic oral rinse  15 mL Mouth Rinse QID  . enoxaparin (LOVENOX) injection  40 mg Subcutaneous Daily  . fluconazole (DIFLUCAN) IV  200 mg Intravenous Q24H  . imipenem-cilastatin  500 mg Intravenous Q8H  . pantoprazole (PROTONIX) IV  40 mg Intravenous Q12H    Objective: Vital signs in last 24 hours: Temp:  [97.5 F (36.4 C)-98.2 F (36.8 C)] 98 F (36.7 C) (12/04 0508) Pulse Rate:  [83-89] 83 (12/04 0508) Resp:  [18] 18 (12/04 0508) BP: (121-136)/(71-78) 136/75 mmHg (12/04 0508) SpO2:  [98 %-99 %] 98 % (12/04 0508)   General appearance: alert, cooperative and fatigued Resp: clear to auscultation bilaterally Cardio: regular rate and rhythm GI:  normal findings: bowel sounds normal and soft, non-tender  Lab Results  Recent Labs  08/28/13 0500 08/28/13 0858 08/29/13 0534  WBC 10.2  --   --   HGB 9.1*  --   --   HCT 28.2*  --   --   NA  --  153* 147*  K  --  4.3 3.8  CL  --  125* 119*  CO2  --  18* 18*  BUN  --  58* 43*  CREATININE  --  2.42* 1.96*   Liver Panel No results found for this basename: PROT, ALBUMIN, AST, ALT, ALKPHOS, BILITOT, BILIDIR, IBILI,  in the last 72 hours Sedimentation Rate No results found for this basename: ESRSEDRATE,  in the last 72 hours C-Reactive Protein No results found for this basename: CRP,  in the last 72 hours  Microbiology: Recent Results (from the past 240 hour(s))  CULTURE, BLOOD (ROUTINE X 2)     Status: None   Collection Time    08/20/13  4:40 PM      Result Value  Range Status   Specimen Description BLOOD LEFT ARM   Final   Special Requests BOTTLES DRAWN AEROBIC ONLY 3CC   Final   Culture  Setup Time     Final   Value: 08/21/2013 02:13     Performed at [facility]   Culture     Final   Value: NO GROWTH 5 DAYS     Performed at [facility]   Report Status 08/27/2013 FINAL   Final  CULTURE, BLOOD (ROUTINE X 2)     Status: None   Collection Time    08/20/13  4:55 PM      Result Value Range Status   Specimen Description BLOOD RIGHT ARM   Final   Special Requests BOTTLES DRAWN AEROBIC ONLY 10CC   Final   Culture  Setup Time     Final   Value: 08/21/2013 02:13     Performed at [facility]   Culture     Final   Value: NO GROWTH 5 DAYS     Performed at [facility]   Report Status 08/27/2013 FINAL   Final  MRSA PCR SCREENING     Status: None   Collection Time    08/24/13  8:07 PM      Result Value Range Status   MRSA by PCR NEGATIVE  NEGATIVE Final   Comment:            The GeneXpert MRSA Assay (FDA     approved for NASAL specimens     only), is one component of a     comprehensive MRSA colonization     surveillance program. It is not     intended to diagnose MRSA     infection nor to guide or     monitor treatment for     MRSA infections.  BODY FLUID CULTURE     Status: None   Collection Time    08/25/13  3:36 PM      Result Value Range Status   Specimen Description PERITONEAL CAVITY   Final   Special Requests Normal   Final   Gram Stain     Final   Value: ABUNDANT WBC PRESENT,BOTH PMN AND MONONUCLEAR     NO ORGANISMS SEEN     Performed at [facility]   Culture     Final   Value: NO GROWTH 3 DAYS  Performed at [facility]   Report Status 08/29/2013 FINAL   Final    Studies/Results: Dg Ugi W/water Sol Cm  08/28/2013   CLINICAL DATA:  Evaluate duodenal leak following Cheree Ditto patch placement.  EXAM: WATER SOLUBLE UPPER GI SERIES  TECHNIQUE: Single-column upper GI series was  performed using water soluble contrast.  CONTRAST:  150 mL Omnipaque 300  COMPARISON:  None.  FLUOROSCOPY TIME:  52 seconds  FINDINGS: A preprocedural KUB was performed demonstrating the nasogastric tube with the tip projecting over the stomach, but the proximal port is proximal to the esophagogastric junction. Recommend advancing the nasogastric tube 10 cm.  150 mL Omnipaque 300 was hand injected through an existing nasogastric tube. Following injection of contrast the stomach and duodenum was evaluated under realtime fluoroscopy. The patient was placed in the supine and right and left decubitus positions. Contrast is observed in the gastric antrum traversing the duodenum and into the proximal jejunum. There is no extravasation of contrast. Following the fluoroscopic evaluation a postprocedural KUB was performed demonstrating no extraluminal contrast.  There is reflux of contrast into the esophagus.  IMPRESSION: No extraluminal contrast identified to suggest a gastric or duodenal leak.  Nasogastric tube with the tip projecting over the stomach, but the proximal port is proximal to the esophagogastric junction. Recommend advancing the nasogastric tube 10 cm.   Electronically Signed   By: Elige Ko   On: 08/28/2013 09:09     Assessment/Plan: Perforated duodenal ulcer (11-29)  Sepsis  Renal Abscess, ESBL E coli (11-23)  1 cm L ureteral calculus  ARF- improving  WBC normal, afebrile  No change in anbx for now.  Total days of antibiotics: 12 (imipenem/diflucan day 6)  Would aim for 21 days of anbx from his renal drain on 11-23 (end date 12-14).  Plan for repeat CT abd (to plan staging of stone extraction) prior to d/c (pt and wife believe this will be around 12-8/9).           Johny Sax Infectious Diseases (pager) [phone] [email or web address] 08/29/2013, 11:29 AM  LOS: 11 days

## 2013-08-29 NOTE — Progress Notes (Signed)
[facility]                                                                                Patient Demographics  Nathan Rivera, is a 67 y.o. male, DOB - 1945/10/15, ZOX:[id number]  Admit date - 08/18/2013   Admitting Physician Costin Otelia Sergeant, MD  Outpatient Primary MD for the patient is [facility], Madaline Guthrie, MD  LOS - 11   No chief complaint on file.       Assessment & Plan  Principal Problem:   Severe sepsis Active Problems:   PVD (peripheral vascular disease)   Hypertension   Acute renal failure   Hydronephrosis, left   Renal abscess   Acute respiratory failure with hypoxia   Dehydration   Bacteremia due to ESBL Escherichia coli  Perforated duodenal ulcer  -x ray on 11/27 showed mild ileus- but CT abd/pelvis 11/29 revealed free air  - underwent EL/graham patch closure of perf DU  -NGTube discontinued, patient started on sips by sugery -continue antibiotics  Severe sepsis  -resolved, leukocytosis trending downward- 10 -likely secondary to urinary tract involvement vs perf duodenal ulcer -Infectious disease following and recommends 10 days total of antibiotics (Imipenem and Diflucan), currently on day 6; as well as 21 days of anbx from initial placement his renal drain on 11-23.  -Will defer to surgery re: length of anbx post perforated viscus repair.   Hypernatremia/ Dehydration  -continue Dextrose IVF  -Na 147, trending downward, will continue to monitor  Hydronephrosis, left/Renal abscess/1 cm ureteral calculus  -post percutaneous nephrostomy tube  -Spoke with urology, Dr. Vernie Ammons, who recommended internalizing with stent placement done by interventional radiology -Will speak to interventional radiology regarding recommendations. -Plan for repeat CT abd (to plan staging of stone extraction) prior to d/c (pt and wife believe this will be around 12-8/9).   Bacteremia due to ESBL Escherichia coli  -initially treated with Cefepime  -final culture called 11/25  from [facility] with E. Coli positive ESBL  -ID recommend change to Imipenem, -Repeat blood cultures have remained negative  Acute renal failure  -Creatinine trending downward 1.96, will continue to monitor -Likely secondary to obstructive uropathy due to renal stone with hydronephrosis and infection  Acute respiratory failure with hypoxia  -due to sepsis, ARF and volume overload from prior resuscitative efforts  -stable on room air  PVD (peripheral vascular disease)  -Stable  Hypertension  -hold home meds for now  Code Status: Full  Family Communication: Wife at bedside.  Disposition Plan: Patient currently admitted. Will likely need home health on discharge. Currently being transitioned from NG tube to sips of clear liquids as dictated by surgery.  Procedures  -Status post left percutaneous nephrostomy -Exploratory laparotomy with patch closure/omental patch perforated duodenal ulcer with primary repair  Consults   P[facility] ID IR [facility] Urology, Dr. Vernie Ammons, via phone  DVT Prophylaxis  Lovenox   Lab Results  Component Value Date   PLT 285 08/28/2013    Medications  Scheduled Meds: . antiseptic oral rinse  15 mL Mouth Rinse QID  . enoxaparin (LOVENOX) injection  40 mg Subcutaneous Daily  . fluconazole (DIFLUCAN) IV  200 mg Intravenous Q24H  . imipenem-cilastatin  500  mg Intravenous Q8H  . pantoprazole (PROTONIX) IV  40 mg Intravenous Q12H   Continuous Infusions: . dextrose 5 % and 0.45 % NaCl with KCl 20 mEq/L     PRN Meds:.albuterol, fentaNYL, ondansetron (ZOFRAN) IV, ondansetron, sodium chloride, sodium chloride  Antibiotics    Anti-infectives   Start     Dose/Rate Route Frequency Ordered Stop   08/29/13 1230  imipenem-cilastatin (PRIMAXIN) 500 mg in sodium chloride 0.9 % 100 mL IVPB     500 mg 200 mL/hr over 30 Minutes Intravenous Every 8 hours 08/29/13 0802     08/27/13 0200  imipenem-cilastatin (PRIMAXIN) 250 mg in sodium chloride 0.9 % 100 mL IVPB  Status:   Discontinued     250 mg 200 mL/hr over 30 Minutes Intravenous Every 6 hours 08/27/13 0051 08/29/13 0802   08/26/13 1400  vancomycin (VANCOCIN) 1,500 mg in sodium chloride 0.9 % 500 mL IVPB  Status:  Discontinued     1,500 mg 250 mL/hr over 120 Minutes Intravenous Every 48 hours 08/24/13 1301 08/24/13 2004   08/25/13 1800  imipenem-cilastatin (PRIMAXIN) 250 mg in sodium chloride 0.9 % 100 mL IVPB  Status:  Discontinued     250 mg 200 mL/hr over 30 Minutes Intravenous Every 12 hours 08/24/13 1245 08/27/13 0051   08/24/13 2345  fluconazole (DIFLUCAN) IVPB 200 mg     200 mg 100 mL/hr over 60 Minutes Intravenous Every 24 hours 08/24/13 2332     08/24/13 1315  vancomycin (VANCOCIN) 2,250 mg in sodium chloride 0.9 % 500 mL IVPB  Status:  Discontinued     2,250 mg 250 mL/hr over 120 Minutes Intravenous  Once 08/24/13 1301 08/24/13 2004   08/20/13 1200  imipenem-cilastatin (PRIMAXIN) 250 mg in sodium chloride 0.9 % 100 mL IVPB  Status:  Discontinued     250 mg 200 mL/hr over 30 Minutes Intravenous 4 times per day 08/20/13 1132 08/24/13 1245   08/18/13 2200  ceFEPIme (MAXIPIME) 1 g in dextrose 5 % 50 mL IVPB  Status:  Discontinued     1 g 100 mL/hr over 30 Minutes Intravenous Every 12 hours 08/18/13 1846 08/18/13 1847   08/18/13 2000  ceFEPIme (MAXIPIME) 1 g in dextrose 5 % 50 mL IVPB  Status:  Discontinued     1 g 100 mL/hr over 30 Minutes Intravenous Every 24 hours 08/18/13 1728 08/18/13 1846   08/18/13 1900  ceFEPIme (MAXIPIME) 1 g in dextrose 5 % 50 mL IVPB  Status:  Discontinued     1 g 100 mL/hr over 30 Minutes Intravenous Every 24 hours 08/18/13 1847 08/20/13 1132       Time Spent in minutes   25 minutes   Ah Bott D.O. on 08/29/2013 at 10:41 AM  Between 7am to 7pm - Pager - [phone]  After 7pm go to [email or web address] - password [email or web address]  And look for the night coverage person covering for me after hours  [facility] Office  [phone]    Subjective:    [name] seen and examined today.  Patient wishes eat and drink as he states he is very hungry.  He has no other complaints at this time. Patient denies dizziness, chest pain, shortness of breath, abdominal pain, N/V/D/C, new weakness, numbess, tingling.    Objective:   Filed Vitals:   08/28/13 0647 08/28/13 1300 08/28/13 2158 08/29/13 0508  BP: 122/76 121/78 131/71 136/75  Pulse: 77 89 85 83  Temp: 98.2 F (36.8 C) 98.2 F (36.8 C) 97.5 F (36.4  C) 98 F (36.7 C)  TempSrc:  Oral Oral Oral  Resp: 16 18 18 18   Height:      Weight:      SpO2: 97% 98% 99% 98%    Wt Readings from Last 3 Encounters:  08/24/13 108.41 kg (239 lb)  08/24/13 108.41 kg (239 lb)  08/29/11 115.44 kg (254 lb 8 oz)     Intake/Output Summary (Last 24 hours) at 08/29/13 1041 Last data filed at 08/29/13 0646  Gross per 24 hour  Intake   2940 ml  Output   3735 ml  Net   -795 ml    Exam  General: Well developed, well nourished, NAD, appears stated age  HEENT: NCAT, PERRLA, EOMI, Anicteic Sclera, mucous membranes moist. No pharyngeal erythema or exudates  Neck: Supple, no JVD, no masses  Cardiovascular: S1 S2 auscultated, no rubs, murmurs or gallops. Regular rate and rhythm.  Respiratory: Clear to auscultation bilaterally with equal chest rise  Abdomen: Soft, nontender, nondistended, + bowel sounds, Drains noted and are clean, drains also noted left and right abdomen.  Extremities: warm dry without cyanosis clubbing or edema  Neuro: AAOx3, cranial nerves grossly intact.   Skin: Without rashes exudates or nodules  Psych: Normal affect and demeanor with intact judgement and insight  Data Review   Micro Results Recent Results (from the past 240 hour(s))  CULTURE, BLOOD (ROUTINE X 2)     Status: None   Collection Time    08/20/13  4:40 PM      Result Value Range Status   Specimen Description BLOOD LEFT ARM   Final   Special Requests BOTTLES DRAWN AEROBIC ONLY 3CC   Final   Culture   Setup Time     Final   Value: 08/21/2013 02:13     Performed at [facility]   Culture     Final   Value: NO GROWTH 5 DAYS     Performed at [facility]   Report Status 08/27/2013 FINAL   Final  CULTURE, BLOOD (ROUTINE X 2)     Status: None   Collection Time    08/20/13  4:55 PM      Result Value Range Status   Specimen Description BLOOD RIGHT ARM   Final   Special Requests BOTTLES DRAWN AEROBIC ONLY 10CC   Final   Culture  Setup Time     Final   Value: 08/21/2013 02:13     Performed at [facility]   Culture     Final   Value: NO GROWTH 5 DAYS     Performed at [facility]   Report Status 08/27/2013 FINAL   Final  MRSA PCR SCREENING     Status: None   Collection Time    08/24/13  8:07 PM      Result Value Range Status   MRSA by PCR NEGATIVE  NEGATIVE Final   Comment:            The GeneXpert MRSA Assay (FDA     approved for NASAL specimens     only), is one component of a     comprehensive MRSA colonization     surveillance program. It is not     intended to diagnose MRSA     infection nor to guide or     monitor treatment for     MRSA infections.  BODY FLUID CULTURE     Status: None   Collection Time    08/25/13  3:36 PM      Result Value Range Status   Specimen Description PERITONEAL CAVITY   Final   Special Requests Normal   Final   Gram Stain     Final   Value: ABUNDANT WBC PRESENT,BOTH PMN AND MONONUCLEAR     NO ORGANISMS SEEN     Performed at [facility]   Culture     Final   Value: NO GROWTH 3 DAYS     Performed at [facility]   Report Status 08/29/2013 FINAL   Final    Radiology Reports Ct Abdomen Pelvis Wo Contrast  08/24/2013   CLINICAL DATA:  Abdominal pain, sepsis, leukocytosis. "Coffee-ground" fluid draining from NG tube.  EXAM: CT ABDOMEN AND PELVIS WITHOUT CONTRAST  TECHNIQUE: Multidetector CT imaging of the abdomen and pelvis was performed following the standard protocol without intravenous  contrast.  COMPARISON:  [facility] CT abdomen pelvis dated 08/18/2013  FINDINGS: Motion degraded images.  Small right pleural effusion. Associated lower lobe atelectasis bilaterally.  Unenhanced liver, pancreas, and adrenal glands are within normal limits.  Numerous splenic granulomata.  Interval placement of a left percutaneous nephrostomy catheter with decompression of the prior left hydronephrosis. Bilateral renal cysts, poorly evaluated in the absence of intravenous contrast. Stable 10 mm proximal left ureteral calculus (series 2/ image 64).  Atherosclerotic calcifications of the abdominal aorta and branch vessels. Aorto bi-iliac stent graft.  Moderate abdominopelvic ascites, new.  Interval development of moderate free intraperitoneal gas. This would not be accounted for by the nephrostomy placement, which was retroperitoneal. In the absence of interval intraperitoneal procedure, this is worrisome for a perforated hollow viscus. Given the presence of inflammatory stranding (series 2/image 36) and localized gas near the gastric antrum (series 2/image 32), a perforated gastric ulcer is considered.  No suspicious abdominopelvic lymphadenopathy.  Prostate is unremarkable.  Mildly thick-walled bladder (Series 2/ image 99).  Mild degenerative changes of the visualized thoracolumbar spine.  IMPRESSION: Interval development of moderate free intraperitoneal gas, suggesting a perforated hollow viscus. Although the exact etiology is unclear, a perforated gastric ulcer is considered.  Moderate abdominopelvic ascites, new.  Interval placement of a left percutaneous nephrostomy catheter with decompression of prior left hydronephrosis. Stable 10 mm proximal left ureteral calculus.  Mildly thick-walled bladder, correlate for cystitis.  Critical value/emergent results were called by telephone at the time of interpretation on 08/24/2013 at 12:40 PM to Dr.JESSICA [facility] , who verbally acknowledged these results.   Electronically Signed    By: Charline Bills M.D.   On: 08/24/2013 12:44   Dg Chest 1 View  08/24/2013   CLINICAL DATA:  Decreasing oxygen saturation, abdominal pain  EXAM: CHEST - 1 VIEW  COMPARISON:  08/18/2013; 08/17/2013; CT abdomen pelvis -08/24/2013  FINDINGS: Grossly unchanged borderline enlarged cardiac silhouette and mediastinal contours. Interval placement of enteric tube tip and side port projecting below the left hemidiaphragm. Interval removal of left jugular approach central venous catheter. Overall improved aeration of the lungs. There is persistent mild elevation of the right hemidiaphragm and minimal bibasilar opacities, right greater than left. No new focal airspace opacities. Trace right-sided effusion is not excluded. No pneumothorax. Unchanged bones.  IMPRESSION: 1. Overall improved aeration of the lungs suggests resolving edema and/or atelectasis. 2. Persistent mild elevation of the right hemidiaphragm with bibasilar opacities, right greater than left, likely atelectasis. No new focal airspace opacities. 3. Enteric tube tip and side port projects below the left hemidiaphragm.   Electronically Signed   By: Simonne Come  M.D.   On: 08/24/2013 12:50   Ir Perc Nephrostomy Left  08/19/2013   CLINICAL DATA:  Septic shock, gram negative bacteremia and left hydronephrosis secondary to an obstructing ureteral calculus.  EXAM: 1. ULTRASOUND GUIDANCE FOR PUNCTURE OF THE LEFT RENAL COLLECTING SYSTEM. 2. LEFT PERCUTANEOUS NEPHROSTOMY TUBE PLACEMENT.  MEDICATIONS: No additional medications were administered.  ANESTHESIA/SEDATION: 1.0 mg IV Versed; 25 mcg IV Fentanyl.  Total Moderate Sedation Time  ONE 3 minutes  CONTRAST:  10 ml Omnipaque 300  COMPARISON:  None.  FLUOROSCOPY TIME:  30 seconds.  PROCEDURE: The procedure, risks, benefits, and alternatives were explained to the patient. Questions regarding the procedure were encouraged and answered. The patient understands and consents to the procedure.  The left flank region  was prepped with Betadine in a sterile fashion, and a sterile drape was applied covering the operative field. A sterile gown and sterile gloves were used for the procedure. Local anesthesia was provided with 1% Lidocaine.  Ultrasound was used to localize the left kidney. Under direct ultrasound guidance, a 21 gauge needle was advanced into the renal collecting system. Ultrasound image documentation was performed. Aspiration of urine sample was performed followed by contrast injection. A sample of aspirated urine was sent for culture analysis.  A transitional dilator was advanced over a guidewire. Percutaneous tract dilatation was then performed over the guidewire. A 10 -French percutaneous nephrostomy tube was then advanced and formed in the collecting system. Catheter position was confirmed by fluoroscopy after contrast injection.  The catheter was secured at the skin with a Prolene retention suture and Stat-Lock device. A gravity bag was placed.  COMPLICATIONS: None.  FINDINGS: Ultrasound confirms significant left hydronephrosis. After needle puncture, there was immediate return of overtly purulent fluid consistent with pyonephrosis. After placement of the 10 French nephrostomy tubes, there is rapid drainage of purulent fluid.  IMPRESSION: Left percutaneous nephrostomy tube placement. There was evidence of overt pyonephrosis. A fluid sample was sent for culture analysis. A 10 French nephrostomy tube was placed, formed in the renal pelvis and connected to gravity bag drainage.   Electronically Signed   By: Irish Lack M.D.   On: 08/19/2013 19:58   Ir US Guide Bx Asp/drain  08/19/2013   CLINICAL DATA:  Septic shock, gram negative bacteremia and left hydronephrosis secondary to an obstructing ureteral calculus.  EXAM: 1. ULTRASOUND GUIDANCE FOR PUNCTURE OF THE LEFT RENAL COLLECTING SYSTEM. 2. LEFT PERCUTANEOUS NEPHROSTOMY TUBE PLACEMENT.  MEDICATIONS: No additional medications were administered.   ANESTHESIA/SEDATION: 1.0 mg IV Versed; 25 mcg IV Fentanyl.  Total Moderate Sedation Time  ONE 3 minutes  CONTRAST:  10 ml Omnipaque 300  COMPARISON:  None.  FLUOROSCOPY TIME:  30 seconds.  PROCEDURE: The procedure, risks, benefits, and alternatives were explained to the patient. Questions regarding the procedure were encouraged and answered. The patient understands and consents to the procedure.  The left flank region was prepped with Betadine in a sterile fashion, and a sterile drape was applied covering the operative field. A sterile gown and sterile gloves were used for the procedure. Local anesthesia was provided with 1% Lidocaine.  Ultrasound was used to localize the left kidney. Under direct ultrasound guidance, a 21 gauge needle was advanced into the renal collecting system. Ultrasound image documentation was performed. Aspiration of urine sample was performed followed by contrast injection. A sample of aspirated urine was sent for culture analysis.  A transitional dilator was advanced over a guidewire. Percutaneous tract dilatation was then performed over the guidewire.  A 10 -French percutaneous nephrostomy tube was then advanced and formed in the collecting system. Catheter position was confirmed by fluoroscopy after contrast injection.  The catheter was secured at the skin with a Prolene retention suture and Stat-Lock device. A gravity bag was placed.  COMPLICATIONS: None.  FINDINGS: Ultrasound confirms significant left hydronephrosis. After needle puncture, there was immediate return of overtly purulent fluid consistent with pyonephrosis. After placement of the 10 French nephrostomy tubes, there is rapid drainage of purulent fluid.  IMPRESSION: Left percutaneous nephrostomy tube placement. There was evidence of overt pyonephrosis. A fluid sample was sent for culture analysis. A 10 French nephrostomy tube was placed, formed in the renal pelvis and connected to gravity bag drainage.   Electronically Signed    By: Irish Lack M.D.   On: 08/19/2013 19:58   Dg Chest Port 1 View  08/26/2013   CLINICAL DATA:  Endotracheal tube removal  EXAM: PORTABLE CHEST - 1 VIEW  COMPARISON:  08/24/2013, 08/18/2013  FINDINGS: There has been interval removal of the endotracheal tube. There is a nasogastric tube in unchanged position. There is elevation of the right diaphragm. There is left basilar atelectasis versus scarring. There is no focal consolidation, pleural effusion or pneumothorax. Normal cardiomediastinal silhouette. The osseous structures are unremarkable.  IMPRESSION: Interval removal of the endotracheal tube. Otherwise no significant interval change.   Electronically Signed   By: Elige Ko   On: 08/26/2013 08:15   Portable Chest Xray  08/24/2013   CLINICAL DATA:  Endotracheal tube placement.  EXAM: PORTABLE CHEST - 1 VIEW  COMPARISON:  08/24/2013 at 1143 hr wound.  FINDINGS: Endotracheal tube is been placed with tip measuring 4.7 cm above the carinal. Enteric tube remains in place without evidence of change. The tip is off the field of view. There is shallow inspiration with linear atelectasis developing in both lung bases. Probable small right pleural effusion. Normal heart size and pulmonary vascularity. No pneumothorax.  IMPRESSION: Endotracheal tube tip measures about 4.7 cm above the carina. Increasing atelectasis in the lung bases. Probable small right pleural effusion.   Electronically Signed   By: Burman Nieves M.D.   On: 08/24/2013 22:21   Dg Chest Port 1 View  08/18/2013   CLINICAL DATA:  Central line placement  EXAM: PORTABLE CHEST - 1 VIEW  COMPARISON:  08/17/2013  FINDINGS: Left internal jugular central line has been placed with tip over the anticipated position of the junction of the brachiocephalic vein and superior vena cava. No pneumothorax. Heart size is normal. Lungs are clear.  IMPRESSION: Central line as described above with no pneumothorax   Electronically Signed   By: Esperanza Heir M.D.   On: 08/18/2013 20:58   Dg Abd 2 Views  08/22/2013   CLINICAL DATA:  Generalized abdominal pain  EXAM: ABDOMEN - 2 VIEW  COMPARISON:  08/18/2013  FINDINGS: There is nonobstructive bowel gas pattern. 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 Left nephrostomy catheter. No free abdominal air. Mild gaseous distended small bowel loops mid abdomen probable mild ileus.  IMPRESSION: Mild gaseous distended small bowel loops mid abdomen probable mild ileus. No air-fluid levels. Left nephrostomy catheter. No free abdominal air.   Electronically Signed   By: Natasha Mead M.D.   On: 08/22/2013 12:18   Dg Kayleen Memos W/water Sol Cm  08/28/2013   CLINICAL DATA:  Evaluate duodenal leak following Cheree Ditto patch placement.  EXAM: WATER SOLUBLE UPPER GI SERIES  TECHNIQUE: Single-column upper GI series was performed  using water soluble contrast.  CONTRAST:  150 mL Omnipaque 300  COMPARISON:  None.  FLUOROSCOPY TIME:  52 seconds  FINDINGS: A preprocedural KUB was performed demonstrating the nasogastric tube with the tip projecting over the stomach, but the proximal port is proximal to the esophagogastric junction. Recommend advancing the nasogastric tube 10 cm.  150 mL Omnipaque 300 was hand injected through an existing nasogastric tube. Following injection of contrast the stomach and duodenum was evaluated under realtime fluoroscopy. The patient was placed in the supine and right and left decubitus positions. Contrast is observed in the gastric antrum traversing the duodenum and into the proximal jejunum. There is no extravasation of contrast. Following the fluoroscopic evaluation a postprocedural KUB was performed demonstrating no extraluminal contrast.  There is reflux of contrast into the esophagus.  IMPRESSION: No extraluminal contrast identified to suggest a gastric or duodenal leak.  Nasogastric tube with the tip projecting over the stomach, but the proximal port is proximal to the esophagogastric junction.  Recommend advancing the nasogastric tube 10 cm.   Electronically Signed   By: Elige Ko   On: 08/28/2013 09:09    CBC  Recent Labs Lab 08/23/13 1818 08/24/13 0701 08/25/13 0404 08/26/13 0421 08/28/13 0500  WBC 44.0* 40.5* 26.6* 15.6* 10.2  HGB 11.8* 11.4* 9.4* 9.3* 9.1*  HCT 34.1* 32.9* 27.5* 27.4* 28.2*  PLT 328 331 321 328 285  MCV 85.7 85.2 86.8 87.0 89.5  MCH 29.6 29.5 29.7 29.5 28.9  MCHC 34.6 34.7 34.2 33.9 32.3  RDW 17.7* 18.0* 18.3* 18.4* 18.9*  LYMPHSABS  --  2.8  --   --   --   MONOABS  --  0.8  --   --   --   EOSABS  --  0.0  --   --   --   BASOSABS  --  0.0  --   --   --     Chemistries   Recent Labs Lab 08/24/13 0701 08/25/13 0404 08/26/13 0421 08/28/13 0858 08/29/13 0534  NA 151* 148* 154* 153* 147*  K 4.9 4.9 4.5 4.3 3.8  CL 120* 119* 126* 125* 119*  CO2 17* 14* 14* 18* 18*  GLUCOSE 96 99 112* 106* 96  BUN 98* 102* 95* 58* 43*  CREATININE 4.44* 4.59* 4.07* 2.42* 1.96*  CALCIUM 8.5 7.4* 7.7* 8.1* 7.4*   ------------------------------------------------------------------------------------------------------------------ estimated creatinine clearance is 45.8 ml/min (by C-G formula based on Cr of 1.96). ------------------------------------------------------------------------------------------------------------------ No results found for this basename: HGBA1C,  in the last 72 hours ------------------------------------------------------------------------------------------------------------------ No results found for this basename: CHOL, HDL, LDLCALC, TRIG, CHOLHDL, LDLDIRECT,  in the last 72 hours ------------------------------------------------------------------------------------------------------------------ No results found for this basename: TSH, T4TOTAL, FREET3, T3FREE, THYROIDAB,  in the last 72 hours ------------------------------------------------------------------------------------------------------------------ No results found for this  basename: VITAMINB12, FOLATE, FERRITIN, TIBC, IRON, RETICCTPCT,  in the last 72 hours  Coagulation profile No results found for this basename: INR, PROTIME,  in the last 168 hours  No results found for this basename: DDIMER,  in the last 72 hours  Cardiac Enzymes No results found for this basename: CK, CKMB, TROPONINI, MYOGLOBIN,  in the last 168 hours ------------------------------------------------------------------------------------------------------------------ No components found with this basename: POCBNP,

## 2013-08-29 NOTE — Progress Notes (Signed)
Left sided JP drain removed.  Tolerated well.  No bleeding.  4x4 applied.

## 2013-08-30 LAB — CBC
HCT: 29.3 % — ABNORMAL LOW (ref 39.0–52.0)
MCH: 28.9 pg (ref 26.0–34.0)
RDW: 18.9 % — ABNORMAL HIGH (ref 11.5–15.5)
WBC: 9.4 10*3/uL (ref 4.0–10.5)

## 2013-08-30 LAB — BASIC METABOLIC PANEL
BUN: 31 mg/dL — ABNORMAL HIGH (ref 6–23)
CO2: 13 mEq/L — ABNORMAL LOW (ref 19–32)
Chloride: 116 mEq/L — ABNORMAL HIGH (ref 96–112)
Creatinine, Ser: 1.63 mg/dL — ABNORMAL HIGH (ref 0.50–1.35)
GFR calc Af Amer: 49 mL/min — ABNORMAL LOW (ref >90)
Potassium: 4.3 mEq/L (ref 3.5–5.1)

## 2013-08-30 NOTE — Progress Notes (Signed)
6 Days Post-Op  Subjective: Pt doing well.  No complaints this AM. Tol some liquids last night Abd less sore.  Objective: Vital signs in last 24 hours: Temp:  [97.7 F (36.5 C)-98.6 F (37 C)] 98.6 F (37 C) (12/05 0651) Pulse Rate:  [78-85] 78 (12/05 0651) Resp:  [16-18] 16 (12/05 0651) BP: (116-126)/(72-75) 116/74 mmHg (12/05 0651) SpO2:  [98 %] 98 % (12/05 0651) Last BM Date: 08/18/13  Intake/Output from previous day: 12/04 0701 - 12/05 0700 In: 1600 [P.O.:100; I.V.:1290; IV Piggyback:200] Out: 2325 [Urine:2275; Drains:50] Intake/Output this shift: Total I/O In: -  Out: 105 [Urine:100; Drains:5]  General appearance: alert and cooperative Cardio: regular rate and rhythm, S1, S2 normal, no murmur, click, rub or gallop GI: sof, NTTP, ND active BS, JP serous  Lab Results:   Recent Labs  08/28/13 0500  WBC 10.2  HGB 9.1*  HCT 28.2*  PLT 285   BMET  Recent Labs  08/28/13 0858 08/29/13 0534  NA 153* 147*  K 4.3 3.8  CL 125* 119*  CO2 18* 18*  GLUCOSE 106* 96  BUN 58* 43*  CREATININE 2.42* 1.96*  CALCIUM 8.1* 7.4*   PT/INR No results found for this basename: LABPROT, INR,  in the last 72 hours ABG No results found for this basename: PHART, PCO2, PO2, HCO3,  in the last 72 hours  Studies/Results: Dg Ugi W/water Sol Cm  08/28/2013   CLINICAL DATA:  Evaluate duodenal leak following Cheree Ditto patch placement.  EXAM: WATER SOLUBLE UPPER GI SERIES  TECHNIQUE: Single-column upper GI series was performed using water soluble contrast.  CONTRAST:  150 mL Omnipaque 300  COMPARISON:  None.  FLUOROSCOPY TIME:  52 seconds  FINDINGS: A preprocedural KUB was performed demonstrating the nasogastric tube with the tip projecting over the stomach, but the proximal port is proximal to the esophagogastric junction. Recommend advancing the nasogastric tube 10 cm.  150 mL Omnipaque 300 was hand injected through an existing nasogastric tube. Following injection of contrast the stomach  and duodenum was evaluated under realtime fluoroscopy. The patient was placed in the supine and right and left decubitus positions. Contrast is observed in the gastric antrum traversing the duodenum and into the proximal jejunum. There is no extravasation of contrast. Following the fluoroscopic evaluation a postprocedural KUB was performed demonstrating no extraluminal contrast.  There is reflux of contrast into the esophagus.  IMPRESSION: No extraluminal contrast identified to suggest a gastric or duodenal leak.  Nasogastric tube with the tip projecting over the stomach, but the proximal port is proximal to the esophagogastric junction. Recommend advancing the nasogastric tube 10 cm.   Electronically Signed   By: Elige Ko   On: 08/28/2013 09:09    Anti-infectives: Anti-infectives   Start     Dose/Rate Route Frequency Ordered Stop   08/29/13 1230  imipenem-cilastatin (PRIMAXIN) 500 mg in sodium chloride 0.9 % 100 mL IVPB     500 mg 200 mL/hr over 30 Minutes Intravenous Every 8 hours 08/29/13 0802     08/27/13 0200  imipenem-cilastatin (PRIMAXIN) 250 mg in sodium chloride 0.9 % 100 mL IVPB  Status:  Discontinued     250 mg 200 mL/hr over 30 Minutes Intravenous Every 6 hours 08/27/13 0051 08/29/13 0802   08/26/13 1400  vancomycin (VANCOCIN) 1,500 mg in sodium chloride 0.9 % 500 mL IVPB  Status:  Discontinued     1,500 mg 250 mL/hr over 120 Minutes Intravenous Every 48 hours 08/24/13 1301 08/24/13 2004   08/25/13  1800  imipenem-cilastatin (PRIMAXIN) 250 mg in sodium chloride 0.9 % 100 mL IVPB  Status:  Discontinued     250 mg 200 mL/hr over 30 Minutes Intravenous Every 12 hours 08/24/13 1245 08/27/13 0051   08/24/13 2345  fluconazole (DIFLUCAN) IVPB 200 mg     200 mg 100 mL/hr over 60 Minutes Intravenous Every 24 hours 08/24/13 2332     08/24/13 1315  vancomycin (VANCOCIN) 2,250 mg in sodium chloride 0.9 % 500 mL IVPB  Status:  Discontinued     2,250 mg 250 mL/hr over 120 Minutes Intravenous   Once 08/24/13 1301 08/24/13 2004   08/20/13 1200  imipenem-cilastatin (PRIMAXIN) 250 mg in sodium chloride 0.9 % 100 mL IVPB  Status:  Discontinued     250 mg 200 mL/hr over 30 Minutes Intravenous 4 times per day 08/20/13 1132 08/24/13 1245   08/18/13 2200  ceFEPIme (MAXIPIME) 1 g in dextrose 5 % 50 mL IVPB  Status:  Discontinued     1 g 100 mL/hr over 30 Minutes Intravenous Every 12 hours 08/18/13 1846 08/18/13 1847   08/18/13 2000  ceFEPIme (MAXIPIME) 1 g in dextrose 5 % 50 mL IVPB  Status:  Discontinued     1 g 100 mL/hr over 30 Minutes Intravenous Every 24 hours 08/18/13 1728 08/18/13 1846   08/18/13 1900  ceFEPIme (MAXIPIME) 1 g in dextrose 5 % 50 mL IVPB  Status:  Discontinued     1 g 100 mL/hr over 30 Minutes Intravenous Every 24 hours 08/18/13 1847 08/20/13 1132      Assessment/Plan: s/p Procedure(s) with comments: EXPLORATORY LAPAROTOMY (N/A) - Gram patch closure Will order Clear Liq diet Encourage ambulatin OK to Shower If JP still with min serous output after PO started, OK to pull likely Saturday Cont' abx as per ID- appreciate assistance   LOS: 12 days    Marigene Ehlers., Jed Limerick 08/30/2013

## 2013-08-30 NOTE — Progress Notes (Signed)
INFECTIOUS DISEASE PROGRESS NOTE  ID: Nathan Rivera is a 67 y.o. male with  Principal Problem:   Severe sepsis Active Problems:   PVD (peripheral vascular disease)   Hypertension   Acute renal failure   Hydronephrosis, left   Renal abscess   Acute respiratory failure with hypoxia   Dehydration   Bacteremia due to ESBL Escherichia coli  Subjective: Without complaints.  No BM.   Abtx:  Anti-infectives   Start     Dose/Rate Route Frequency Ordered Stop   08/29/13 1230  imipenem-cilastatin (PRIMAXIN) 500 mg in sodium chloride 0.9 % 100 mL IVPB     500 mg 200 mL/hr over 30 Minutes Intravenous Every 8 hours 08/29/13 0802     08/27/13 0200  imipenem-cilastatin (PRIMAXIN) 250 mg in sodium chloride 0.9 % 100 mL IVPB  Status:  Discontinued     250 mg 200 mL/hr over 30 Minutes Intravenous Every 6 hours 08/27/13 0051 08/29/13 0802   08/26/13 1400  vancomycin (VANCOCIN) 1,500 mg in sodium chloride 0.9 % 500 mL IVPB  Status:  Discontinued     1,500 mg 250 mL/hr over 120 Minutes Intravenous Every 48 hours 08/24/13 1301 08/24/13 2004   08/25/13 1800  imipenem-cilastatin (PRIMAXIN) 250 mg in sodium chloride 0.9 % 100 mL IVPB  Status:  Discontinued     250 mg 200 mL/hr over 30 Minutes Intravenous Every 12 hours 08/24/13 1245 08/27/13 0051   08/24/13 2345  fluconazole (DIFLUCAN) IVPB 200 mg     200 mg 100 mL/hr over 60 Minutes Intravenous Every 24 hours 08/24/13 2332     08/24/13 1315  vancomycin (VANCOCIN) 2,250 mg in sodium chloride 0.9 % 500 mL IVPB  Status:  Discontinued     2,250 mg 250 mL/hr over 120 Minutes Intravenous  Once 08/24/13 1301 08/24/13 2004   08/20/13 1200  imipenem-cilastatin (PRIMAXIN) 250 mg in sodium chloride 0.9 % 100 mL IVPB  Status:  Discontinued     250 mg 200 mL/hr over 30 Minutes Intravenous 4 times per day 08/20/13 1132 08/24/13 1245   08/18/13 2200  ceFEPIme (MAXIPIME) 1 g in dextrose 5 % 50 mL IVPB  Status:  Discontinued     1 g 100 mL/hr over 30 Minutes  Intravenous Every 12 hours 08/18/13 1846 08/18/13 1847   08/18/13 2000  ceFEPIme (MAXIPIME) 1 g in dextrose 5 % 50 mL IVPB  Status:  Discontinued     1 g 100 mL/hr over 30 Minutes Intravenous Every 24 hours 08/18/13 1728 08/18/13 1846   08/18/13 1900  ceFEPIme (MAXIPIME) 1 g in dextrose 5 % 50 mL IVPB  Status:  Discontinued     1 g 100 mL/hr over 30 Minutes Intravenous Every 24 hours 08/18/13 1847 08/20/13 1132      Medications:  Scheduled: . antiseptic oral rinse  15 mL Mouth Rinse QID  . enoxaparin (LOVENOX) injection  40 mg Subcutaneous Daily  . fluconazole (DIFLUCAN) IV  200 mg Intravenous Q24H  . imipenem-cilastatin  500 mg Intravenous Q8H  . pantoprazole (PROTONIX) IV  40 mg Intravenous Q12H    Objective: Vital signs in last 24 hours: Temp:  [97.7 F (36.5 C)-98.6 F (37 C)] 98.6 F (37 C) (12/05 0651) Pulse Rate:  [78-85] 78 (12/05 0651) Resp:  [16-18] 16 (12/05 0651) BP: (116-126)/(72-75) 116/74 mmHg (12/05 0651) SpO2:  [98 %] 98 % (12/05 0651)   General appearance: alert, cooperative and no distress Resp: clear to auscultation bilaterally Cardio: regular rate and rhythm GI: normal  findings: bowel sounds normal and soft, non-tender  Lab Results  Recent Labs  08/28/13 0500  08/29/13 0534 08/30/13 0820  WBC 10.2  --   --  9.4  HGB 9.1*  --   --  9.3*  HCT 28.2*  --   --  29.3*  NA  --   < > 147* 140  K  --   < > 3.8 4.3  CL  --   < > 119* 116*  CO2  --   < > 18* 13*  BUN  --   < > 43* 31*  CREATININE  --   < > 1.96* 1.63*  < > = values in this interval not displayed. Liver Panel No results found for this basename: PROT, ALBUMIN, AST, ALT, ALKPHOS, BILITOT, BILIDIR, IBILI,  in the last 72 hours Sedimentation Rate No results found for this basename: ESRSEDRATE,  in the last 72 hours C-Reactive Protein No results found for this basename: CRP,  in the last 72 hours  Microbiology: Recent Results (from the past 240 hour(s))  CULTURE, BLOOD (ROUTINE X 2)      Status: None   Collection Time    08/20/13  4:40 PM      Result Value Range Status   Specimen Description BLOOD LEFT ARM   Final   Special Requests BOTTLES DRAWN AEROBIC ONLY 3CC   Final   Culture  Setup Time     Final   Value: 08/21/2013 02:13     Performed at [facility]   Culture     Final   Value: NO GROWTH 5 DAYS     Performed at [facility]   Report Status 08/27/2013 FINAL   Final  CULTURE, BLOOD (ROUTINE X 2)     Status: None   Collection Time    08/20/13  4:55 PM      Result Value Range Status   Specimen Description BLOOD RIGHT ARM   Final   Special Requests BOTTLES DRAWN AEROBIC ONLY 10CC   Final   Culture  Setup Time     Final   Value: 08/21/2013 02:13     Performed at [facility]   Culture     Final   Value: NO GROWTH 5 DAYS     Performed at [facility]   Report Status 08/27/2013 FINAL   Final  MRSA PCR SCREENING     Status: None   Collection Time    08/24/13  8:07 PM      Result Value Range Status   MRSA by PCR NEGATIVE  NEGATIVE Final   Comment:            The GeneXpert MRSA Assay (FDA     approved for NASAL specimens     only), is one component of a     comprehensive MRSA colonization     surveillance program. It is not     intended to diagnose MRSA     infection nor to guide or     monitor treatment for     MRSA infections.  BODY FLUID CULTURE     Status: None   Collection Time    08/25/13  3:36 PM      Result Value Range Status   Specimen Description PERITONEAL CAVITY   Final   Special Requests Normal   Final   Gram Stain     Final   Value: ABUNDANT WBC PRESENT,BOTH PMN AND MONONUCLEAR     NO ORGANISMS  SEEN     Performed at [facility]   Culture     Final   Value: NO GROWTH 3 DAYS     Performed at [facility]   Report Status 08/29/2013 FINAL   Final    Studies/Results: No results found.   Assessment/Plan: Perforated duodenal ulcer (11-29)  Sepsis  Renal Abscess, ESBL E coli (11-23)    1 cm L ureteral calculus  ARF- improving   Total days of antibiotics: 13 (imipenem/diflucan day 7)  WBC normal, afebrile  Cr continues to improve No change in anbx for now.  Would aim for 21 days of anbx from his renal drain on 11-23 (end date 12-14).  Plan for repeat CT abd (to plan staging of stone extraction) prior to d/c (pt and wife believe this will be around 12-8/9).  Dr Daiva Eves available if questions over weekend.           Johny Sax Infectious Diseases (pager) [phone] [email or web address] 08/30/2013, 11:00 AM  LOS: 12 days

## 2013-08-30 NOTE — Progress Notes (Signed)
[facility]                                                                                Patient Demographics  Nathan Rivera, is a 67 y.o. male, DOB - 1945/10/27, ZOX:[id number]  Admit date - 08/18/2013   Admitting Physician Costin Otelia Sergeant, MD  Outpatient Primary MD for the patient is [facility], Madaline Guthrie, MD  LOS - 12   No chief complaint on file.       Assessment & Plan  Principal Problem:   Severe sepsis Active Problems:   PVD (peripheral vascular disease)   Hypertension   Acute renal failure   Hydronephrosis, left   Renal abscess   Acute respiratory failure with hypoxia   Dehydration   Bacteremia due to ESBL Escherichia coli  Perforated duodenal ulcer  -x ray on 11/27 showed mild ileus- but CT abd/pelvis 11/29 revealed free air  - underwent EL/graham patch closure of perf DU  -NGTube discontinued, patient started on sips by sugery -continue antibiotics  Severe sepsis  -resolved, leukocytosis trending downward-9.3 -likely secondary to urinary tract involvement vs perf duodenal ulcer -Infectious disease following and recommends 10 days total of antibiotics (Imipenem and Diflucan),as well as 21 days of anbx from initial placement his renal drain on 11-23.  -Will defer to surgery re: length of antibiotics post perforated viscus repair.   Hypernatremia/ Dehydration  -continue Dextrose IVF  -Na 147, trending downward, will continue to monitor  Hydronephrosis, left/Renal abscess/1 cm ureteral calculus  -post percutaneous nephrostomy tube  -Spoke with urology, Dr. Vernie Ammons, who recommended internalizing with stent placement done by interventional radiology -Interventional radiology may internalize tube.   -Plan for repeat CT abd (to plan staging of stone extraction) prior to d/c (pt and wife believe this will be around 12-8/9).   Bacteremia due to ESBL Escherichia coli  -initially treated with Cefepime  -final culture called 11/25 from [place] with E. Coli  positive ESBL  -ID recommend change to Imipenem -Repeat blood cultures have remained negative  Acute renal failure  -Creatinine trending downward 1.63, will continue to monitor -Likely secondary to obstructive uropathy due to renal stone with hydronephrosis and infection  Acute respiratory failure with hypoxia  -due to sepsis, ARF and volume overload from prior resuscitative efforts  -stable on room air  PVD (peripheral vascular disease)  -Stable  Hypertension  -hold home meds for now  Code Status: Full  Family Communication: Wife at bedside.  Disposition Plan: Patient currently admitted. Will likely need home health on discharge. Currently being transitioned from NG tube to sips of clear liquids as dictated by surgery.  Procedures  -Status post left percutaneous nephrostomy -Exploratory laparotomy with patch closure/omental patch perforated duodenal ulcer with primary repair  Consults   PCCM ID IR [facility] Urology, Dr. Vernie Ammons, via phone  DVT Prophylaxis  Lovenox   Lab Results  Component Value Date   PLT 252 08/30/2013    Medications  Scheduled Meds: . antiseptic oral rinse  15 mL Mouth Rinse QID  . enoxaparin (LOVENOX) injection  40 mg Subcutaneous Daily  . fluconazole (DIFLUCAN) IV  200 mg Intravenous Q24H  . imipenem-cilastatin  500 mg Intravenous Q8H  . pantoprazole (  PROTONIX) IV  40 mg Intravenous Q12H   Continuous Infusions: . dextrose 5 % and 0.45 % NaCl with KCl 20 mEq/L 75 mL/hr (08/30/13 0423)   PRN Meds:.albuterol, fentaNYL, ondansetron (ZOFRAN) IV, ondansetron, sodium chloride, sodium chloride  Antibiotics    Anti-infectives   Start     Dose/Rate Route Frequency Ordered Stop   08/29/13 1230  imipenem-cilastatin (PRIMAXIN) 500 mg in sodium chloride 0.9 % 100 mL IVPB     500 mg 200 mL/hr over 30 Minutes Intravenous Every 8 hours 08/29/13 0802     08/27/13 0200  imipenem-cilastatin (PRIMAXIN) 250 mg in sodium chloride 0.9 % 100 mL IVPB  Status:   Discontinued     250 mg 200 mL/hr over 30 Minutes Intravenous Every 6 hours 08/27/13 0051 08/29/13 0802   08/26/13 1400  vancomycin (VANCOCIN) 1,500 mg in sodium chloride 0.9 % 500 mL IVPB  Status:  Discontinued     1,500 mg 250 mL/hr over 120 Minutes Intravenous Every 48 hours 08/24/13 1301 08/24/13 2004   08/25/13 1800  imipenem-cilastatin (PRIMAXIN) 250 mg in sodium chloride 0.9 % 100 mL IVPB  Status:  Discontinued     250 mg 200 mL/hr over 30 Minutes Intravenous Every 12 hours 08/24/13 1245 08/27/13 0051   08/24/13 2345  fluconazole (DIFLUCAN) IVPB 200 mg     200 mg 100 mL/hr over 60 Minutes Intravenous Every 24 hours 08/24/13 2332     08/24/13 1315  vancomycin (VANCOCIN) 2,250 mg in sodium chloride 0.9 % 500 mL IVPB  Status:  Discontinued     2,250 mg 250 mL/hr over 120 Minutes Intravenous  Once 08/24/13 1301 08/24/13 2004   08/20/13 1200  imipenem-cilastatin (PRIMAXIN) 250 mg in sodium chloride 0.9 % 100 mL IVPB  Status:  Discontinued     250 mg 200 mL/hr over 30 Minutes Intravenous 4 times per day 08/20/13 1132 08/24/13 1245   08/18/13 2200  ceFEPIme (MAXIPIME) 1 g in dextrose 5 % 50 mL IVPB  Status:  Discontinued     1 g 100 mL/hr over 30 Minutes Intravenous Every 12 hours 08/18/13 1846 08/18/13 1847   08/18/13 2000  ceFEPIme (MAXIPIME) 1 g in dextrose 5 % 50 mL IVPB  Status:  Discontinued     1 g 100 mL/hr over 30 Minutes Intravenous Every 24 hours 08/18/13 1728 08/18/13 1846   08/18/13 1900  ceFEPIme (MAXIPIME) 1 g in dextrose 5 % 50 mL IVPB  Status:  Discontinued     1 g 100 mL/hr over 30 Minutes Intravenous Every 24 hours 08/18/13 1847 08/20/13 1132       Time Spent in minutes   25 minutes   Wladyslawa Disbro D.O. on 08/30/2013 at 11:58 AM  Between 7am to 7pm - Pager - [phone]  After 7pm go to [email or web address] - password [email or web address]  And look for the night coverage person covering for me after hours  [facility] Office  [phone]    Subjective:    Kenwood Rosiak seen and examined today.   Patient has no complaints at this time.  Patient denies dizziness, chest pain, shortness of breath, abdominal pain, N/V/D/C, new weakness, numbess, tingling.    Objective:   Filed Vitals:   08/29/13 0508 08/29/13 1422 08/29/13 2254 08/30/13 0651  BP: 136/75 121/75 126/72 116/74  Pulse: 83 85 82 78  Temp: 98 F (36.7 C) 97.7 F (36.5 C) 98.3 F (36.8 C) 98.6 F (37 C)  TempSrc: Oral Oral Oral Oral  Resp: 18 18 16  16  Height:      Weight:      SpO2: 98% 98% 98% 98%    Wt Readings from Last 3 Encounters:  08/24/13 108.41 kg (239 lb)  08/24/13 108.41 kg (239 lb)  08/29/11 115.44 kg (254 lb 8 oz)     Intake/Output Summary (Last 24 hours) at 08/30/13 1158 Last data filed at 08/30/13 0745  Gross per 24 hour  Intake   1600 ml  Output   2430 ml  Net   -830 ml    Exam  General: Well developed, well nourished, NAD, appears stated age  HEENT: NCAT, PERRLA, EOMI, Anicteic Sclera, mucous membranes moist. No pharyngeal erythema or exudates  Neck: Supple, no JVD, no masses  Cardiovascular: S1 S2 auscultated, no rubs, murmurs or gallops. Regular rate and rhythm.  Respiratory: Clear to auscultation bilaterally with equal chest rise  Abdomen: Soft, nontender, nondistended, + bowel sounds, Drains noted and are clean, drains also noted left and right abdomen.  Extremities: warm dry without cyanosis clubbing or edema  Neuro: AAOx3, cranial nerves grossly intact.   Skin: Without rashes exudates or nodules  Psych: Normal affect and demeanor with intact judgement and insight  Data Review   Micro Results Recent Results (from the past 240 hour(s))  CULTURE, BLOOD (ROUTINE X 2)     Status: None   Collection Time    08/20/13  4:40 PM      Result Value Range Status   Specimen Description BLOOD LEFT ARM   Final   Special Requests BOTTLES DRAWN AEROBIC ONLY 3CC   Final   Culture  Setup Time     Final   Value: 08/21/2013 02:13      Performed at [facility]   Culture     Final   Value: NO GROWTH 5 DAYS     Performed at [facility]   Report Status 08/27/2013 FINAL   Final  CULTURE, BLOOD (ROUTINE X 2)     Status: None   Collection Time    08/20/13  4:55 PM      Result Value Range Status   Specimen Description BLOOD RIGHT ARM   Final   Special Requests BOTTLES DRAWN AEROBIC ONLY 10CC   Final   Culture  Setup Time     Final   Value: 08/21/2013 02:13     Performed at [facility]   Culture     Final   Value: NO GROWTH 5 DAYS     Performed at [facility]   Report Status 08/27/2013 FINAL   Final  MRSA PCR SCREENING     Status: None   Collection Time    08/24/13  8:07 PM      Result Value Range Status   MRSA by PCR NEGATIVE  NEGATIVE Final   Comment:            The GeneXpert MRSA Assay (FDA     approved for NASAL specimens     only), is one component of a     comprehensive MRSA colonization     surveillance program. It is not     intended to diagnose MRSA     infection nor to guide or     monitor treatment for     MRSA infections.  BODY FLUID CULTURE     Status: None   Collection Time    08/25/13  3:36 PM      Result Value Range Status   Specimen Description PERITONEAL  CAVITY   Final   Special Requests Normal   Final   Gram Stain     Final   Value: ABUNDANT WBC PRESENT,BOTH PMN AND MONONUCLEAR     NO ORGANISMS SEEN     Performed at [facility]   Culture     Final   Value: NO GROWTH 3 DAYS     Performed at [facility]   Report Status 08/29/2013 FINAL   Final    Radiology Reports Ct Abdomen Pelvis Wo Contrast  08/24/2013   CLINICAL DATA:  Abdominal pain, sepsis, leukocytosis. "Coffee-ground" fluid draining from NG tube.  EXAM: CT ABDOMEN AND PELVIS WITHOUT CONTRAST  TECHNIQUE: Multidetector CT imaging of the abdomen and pelvis was performed following the standard protocol without intravenous contrast.  COMPARISON:  [facility] CT abdomen pelvis dated  08/18/2013  FINDINGS: Motion degraded images.  Small right pleural effusion. Associated lower lobe atelectasis bilaterally.  Unenhanced liver, pancreas, and adrenal glands are within normal limits.  Numerous splenic granulomata.  Interval placement of a left percutaneous nephrostomy catheter with decompression of the prior left hydronephrosis. Bilateral renal cysts, poorly evaluated in the absence of intravenous contrast. Stable 10 mm proximal left ureteral calculus (series 2/ image 64).  Atherosclerotic calcifications of the abdominal aorta and branch vessels. Aorto bi-iliac stent graft.  Moderate abdominopelvic ascites, new.  Interval development of moderate free intraperitoneal gas. This would not be accounted for by the nephrostomy placement, which was retroperitoneal. In the absence of interval intraperitoneal procedure, this is worrisome for a perforated hollow viscus. Given the presence of inflammatory stranding (series 2/image 36) and localized gas near the gastric antrum (series 2/image 32), a perforated gastric ulcer is considered.  No suspicious abdominopelvic lymphadenopathy.  Prostate is unremarkable.  Mildly thick-walled bladder (Series 2/ image 99).  Mild degenerative changes of the visualized thoracolumbar spine.  IMPRESSION: Interval development of moderate free intraperitoneal gas, suggesting a perforated hollow viscus. Although the exact etiology is unclear, a perforated gastric ulcer is considered.  Moderate abdominopelvic ascites, new.  Interval placement of a left percutaneous nephrostomy catheter with decompression of prior left hydronephrosis. Stable 10 mm proximal left ureteral calculus.  Mildly thick-walled bladder, correlate for cystitis.  Critical value/emergent results were called by telephone at the time of interpretation on 08/24/2013 at 12:40 PM to Dr.JESSICA [facility] , who verbally acknowledged these results.   Electronically Signed   By: Charline Bills M.D.   On: 08/24/2013 12:44    Dg Chest 1 View  08/24/2013   CLINICAL DATA:  Decreasing oxygen saturation, abdominal pain  EXAM: CHEST - 1 VIEW  COMPARISON:  08/18/2013; 08/17/2013; CT abdomen pelvis -08/24/2013  FINDINGS: Grossly unchanged borderline enlarged cardiac silhouette and mediastinal contours. Interval placement of enteric tube tip and side port projecting below the left hemidiaphragm. Interval removal of left jugular approach central venous catheter. Overall improved aeration of the lungs. There is persistent mild elevation of the right hemidiaphragm and minimal bibasilar opacities, right greater than left. No new focal airspace opacities. Trace right-sided effusion is not excluded. No pneumothorax. Unchanged bones.  IMPRESSION: 1. Overall improved aeration of the lungs suggests resolving edema and/or atelectasis. 2. Persistent mild elevation of the right hemidiaphragm with bibasilar opacities, right greater than left, likely atelectasis. No new focal airspace opacities. 3. Enteric tube tip and side port projects below the left hemidiaphragm.   Electronically Signed   By: Simonne Come M.D.   On: 08/24/2013 12:50   Ir Perc Nephrostomy Left  08/19/2013  CLINICAL DATA:  Septic shock, gram negative bacteremia and left hydronephrosis secondary to an obstructing ureteral calculus.  EXAM: 1. ULTRASOUND GUIDANCE FOR PUNCTURE OF THE LEFT RENAL COLLECTING SYSTEM. 2. LEFT PERCUTANEOUS NEPHROSTOMY TUBE PLACEMENT.  MEDICATIONS: No additional medications were administered.  ANESTHESIA/SEDATION: 1.0 mg IV Versed; 25 mcg IV Fentanyl.  Total Moderate Sedation Time  ONE 3 minutes  CONTRAST:  10 ml Omnipaque 300  COMPARISON:  None.  FLUOROSCOPY TIME:  30 seconds.  PROCEDURE: The procedure, risks, benefits, and alternatives were explained to the patient. Questions regarding the procedure were encouraged and answered. The patient understands and consents to the procedure.  The left flank region was prepped with Betadine in a sterile fashion, and  a sterile drape was applied covering the operative field. A sterile gown and sterile gloves were used for the procedure. Local anesthesia was provided with 1% Lidocaine.  Ultrasound was used to localize the left kidney. Under direct ultrasound guidance, a 21 gauge needle was advanced into the renal collecting system. Ultrasound image documentation was performed. Aspiration of urine sample was performed followed by contrast injection. A sample of aspirated urine was sent for culture analysis.  A transitional dilator was advanced over a guidewire. Percutaneous tract dilatation was then performed over the guidewire. A 10 -French percutaneous nephrostomy tube was then advanced and formed in the collecting system. Catheter position was confirmed by fluoroscopy after contrast injection.  The catheter was secured at the skin with a Prolene retention suture and Stat-Lock device. A gravity bag was placed.  COMPLICATIONS: None.  FINDINGS: Ultrasound confirms significant left hydronephrosis. After needle puncture, there was immediate return of overtly purulent fluid consistent with pyonephrosis. After placement of the 10 French nephrostomy tubes, there is rapid drainage of purulent fluid.  IMPRESSION: Left percutaneous nephrostomy tube placement. There was evidence of overt pyonephrosis. A fluid sample was sent for culture analysis. A 10 French nephrostomy tube was placed, formed in the renal pelvis and connected to gravity bag drainage.   Electronically Signed   By: Irish Lack M.D.   On: 08/19/2013 19:58   Ir US Guide Bx Asp/drain  08/19/2013   CLINICAL DATA:  Septic shock, gram negative bacteremia and left hydronephrosis secondary to an obstructing ureteral calculus.  EXAM: 1. ULTRASOUND GUIDANCE FOR PUNCTURE OF THE LEFT RENAL COLLECTING SYSTEM. 2. LEFT PERCUTANEOUS NEPHROSTOMY TUBE PLACEMENT.  MEDICATIONS: No additional medications were administered.  ANESTHESIA/SEDATION: 1.0 mg IV Versed; 25 mcg IV Fentanyl.   Total Moderate Sedation Time  ONE 3 minutes  CONTRAST:  10 ml Omnipaque 300  COMPARISON:  None.  FLUOROSCOPY TIME:  30 seconds.  PROCEDURE: The procedure, risks, benefits, and alternatives were explained to the patient. Questions regarding the procedure were encouraged and answered. The patient understands and consents to the procedure.  The left flank region was prepped with Betadine in a sterile fashion, and a sterile drape was applied covering the operative field. A sterile gown and sterile gloves were used for the procedure. Local anesthesia was provided with 1% Lidocaine.  Ultrasound was used to localize the left kidney. Under direct ultrasound guidance, a 21 gauge needle was advanced into the renal collecting system. Ultrasound image documentation was performed. Aspiration of urine sample was performed followed by contrast injection. A sample of aspirated urine was sent for culture analysis.  A transitional dilator was advanced over a guidewire. Percutaneous tract dilatation was then performed over the guidewire. A 10 -French percutaneous nephrostomy tube was then advanced and formed in the collecting system. Catheter  position was confirmed by fluoroscopy after contrast injection.  The catheter was secured at the skin with a Prolene retention suture and Stat-Lock device. A gravity bag was placed.  COMPLICATIONS: None.  FINDINGS: Ultrasound confirms significant left hydronephrosis. After needle puncture, there was immediate return of overtly purulent fluid consistent with pyonephrosis. After placement of the 10 French nephrostomy tubes, there is rapid drainage of purulent fluid.  IMPRESSION: Left percutaneous nephrostomy tube placement. There was evidence of overt pyonephrosis. A fluid sample was sent for culture analysis. A 10 French nephrostomy tube was placed, formed in the renal pelvis and connected to gravity bag drainage.   Electronically Signed   By: Irish Lack M.D.   On: 08/19/2013 19:58   Dg  Chest Port 1 View  08/26/2013   CLINICAL DATA:  Endotracheal tube removal  EXAM: PORTABLE CHEST - 1 VIEW  COMPARISON:  08/24/2013, 08/18/2013  FINDINGS: There has been interval removal of the endotracheal tube. There is a nasogastric tube in unchanged position. There is elevation of the right diaphragm. There is left basilar atelectasis versus scarring. There is no focal consolidation, pleural effusion or pneumothorax. Normal cardiomediastinal silhouette. The osseous structures are unremarkable.  IMPRESSION: Interval removal of the endotracheal tube. Otherwise no significant interval change.   Electronically Signed   By: Elige Ko   On: 08/26/2013 08:15   Portable Chest Xray  08/24/2013   CLINICAL DATA:  Endotracheal tube placement.  EXAM: PORTABLE CHEST - 1 VIEW  COMPARISON:  08/24/2013 at 1143 hr wound.  FINDINGS: Endotracheal tube is been placed with tip measuring 4.7 cm above the carinal. Enteric tube remains in place without evidence of change. The tip is off the field of view. There is shallow inspiration with linear atelectasis developing in both lung bases. Probable small right pleural effusion. Normal heart size and pulmonary vascularity. No pneumothorax.  IMPRESSION: Endotracheal tube tip measures about 4.7 cm above the carina. Increasing atelectasis in the lung bases. Probable small right pleural effusion.   Electronically Signed   By: Burman Nieves M.D.   On: 08/24/2013 22:21   Dg Chest Port 1 View  08/18/2013   CLINICAL DATA:  Central line placement  EXAM: PORTABLE CHEST - 1 VIEW  COMPARISON:  08/17/2013  FINDINGS: Left internal jugular central line has been placed with tip over the anticipated position of the junction of the brachiocephalic vein and superior vena cava. No pneumothorax. Heart size is normal. Lungs are clear.  IMPRESSION: Central line as described above with no pneumothorax   Electronically Signed   By: Esperanza Heir M.D.   On: 08/18/2013 20:58   Dg Abd 2  Views  08/22/2013   CLINICAL DATA:  Generalized abdominal pain  EXAM: ABDOMEN - 2 VIEW  COMPARISON:  08/18/2013  FINDINGS: There is nonobstructive bowel gas pattern. 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 Left nephrostomy catheter. No free abdominal air. Mild gaseous distended small bowel loops mid abdomen probable mild ileus.  IMPRESSION: Mild gaseous distended small bowel loops mid abdomen probable mild ileus. No air-fluid levels. Left nephrostomy catheter. No free abdominal air.   Electronically Signed   By: Natasha Mead M.D.   On: 08/22/2013 12:18   Dg Kayleen Memos W/water Sol Cm  08/28/2013   CLINICAL DATA:  Evaluate duodenal leak following Cheree Ditto patch placement.  EXAM: WATER SOLUBLE UPPER GI SERIES  TECHNIQUE: Single-column upper GI series was performed using water soluble contrast.  CONTRAST:  150 mL Omnipaque 300  COMPARISON:  None.  FLUOROSCOPY TIME:  52 seconds  FINDINGS: A preprocedural KUB was performed demonstrating the nasogastric tube with the tip projecting over the stomach, but the proximal port is proximal to the esophagogastric junction. Recommend advancing the nasogastric tube 10 cm.  150 mL Omnipaque 300 was hand injected through an existing nasogastric tube. Following injection of contrast the stomach and duodenum was evaluated under realtime fluoroscopy. The patient was placed in the supine and right and left decubitus positions. Contrast is observed in the gastric antrum traversing the duodenum and into the proximal jejunum. There is no extravasation of contrast. Following the fluoroscopic evaluation a postprocedural KUB was performed demonstrating no extraluminal contrast.  There is reflux of contrast into the esophagus.  IMPRESSION: No extraluminal contrast identified to suggest a gastric or duodenal leak.  Nasogastric tube with the tip projecting over the stomach, but the proximal port is proximal to the esophagogastric junction. Recommend advancing the nasogastric tube 10 cm.    Electronically Signed   By: Elige Ko   On: 08/28/2013 09:09    CBC  Recent Labs Lab 08/23/13 1818 08/24/13 0701 08/25/13 0404 08/26/13 0421 08/28/13 0500 08/30/13 0820  WBC 44.0* 40.5* 26.6* 15.6* 10.2 9.4  HGB 11.8* 11.4* 9.4* 9.3* 9.1* 9.3*  HCT 34.1* 32.9* 27.5* 27.4* 28.2* 29.3*  PLT 328 331 321 328 285 252  MCV 85.7 85.2 86.8 87.0 89.5 91.0  MCH 29.6 29.5 29.7 29.5 28.9 28.9  MCHC 34.6 34.7 34.2 33.9 32.3 31.7  RDW 17.7* 18.0* 18.3* 18.4* 18.9* 18.9*  LYMPHSABS  --  2.8  --   --   --   --   MONOABS  --  0.8  --   --   --   --   EOSABS  --  0.0  --   --   --   --   BASOSABS  --  0.0  --   --   --   --     Chemistries   Recent Labs Lab 08/25/13 0404 08/26/13 0421 08/28/13 0858 08/29/13 0534 08/30/13 0820  NA 148* 154* 153* 147* 140  K 4.9 4.5 4.3 3.8 4.3  CL 119* 126* 125* 119* 116*  CO2 14* 14* 18* 18* 13*  GLUCOSE 99 112* 106* 96 92  BUN 102* 95* 58* 43* 31*  CREATININE 4.59* 4.07* 2.42* 1.96* 1.63*  CALCIUM 7.4* 7.7* 8.1* 7.4* 7.5*   ------------------------------------------------------------------------------------------------------------------ estimated creatinine clearance is 55 ml/min (by C-G formula based on Cr of 1.63). ------------------------------------------------------------------------------------------------------------------ No results found for this basename: HGBA1C,  in the last 72 hours ------------------------------------------------------------------------------------------------------------------ No results found for this basename: CHOL, HDL, LDLCALC, TRIG, CHOLHDL, LDLDIRECT,  in the last 72 hours ------------------------------------------------------------------------------------------------------------------ No results found for this basename: TSH, T4TOTAL, FREET3, T3FREE, THYROIDAB,  in the last 72 hours ------------------------------------------------------------------------------------------------------------------ No results  found for this basename: VITAMINB12, FOLATE, FERRITIN, TIBC, IRON, RETICCTPCT,  in the last 72 hours  Coagulation profile No results found for this basename: INR, PROTIME,  in the last 168 hours  No results found for this basename: DDIMER,  in the last 72 hours  Cardiac Enzymes No results found for this basename: CK, CKMB, TROPONINI, MYOGLOBIN,  in the last 168 hours ------------------------------------------------------------------------------------------------------------------ No components found with this basename: POCBNP,

## 2013-08-30 NOTE — Progress Notes (Signed)
ANTIBIOTIC CONSULT NOTE -Follow Up  Pharmacy Consult for Fluconazole, primaxin Indication: Intra-abdominal infection, bowel perf, ecoli sepsis  No Known Allergies  Patient Measurements: Height: 5\' 11"  (180.3 cm) Weight: 239 lb (108.41 kg) IBW/kg (Calculated) : 75.3  Vital Signs: Temp: 98.6 F (37 C) (12/05 0651) Temp src: Oral (12/05 0651) BP: 116/74 mmHg (12/05 0651) Pulse Rate: 78 (12/05 0651) Intake/Output from previous day: 12/04 0701 - 12/05 0700 In: 1600 [P.O.:100; I.V.:1290; IV Piggyback:200] Out: 2325 [Urine:2275; Drains:50] Intake/Output from this shift: Total I/O In: -  Out: 105 [Urine:100; Drains:5]  Labs:  Recent Labs  08/28/13 0500 08/28/13 0858 08/29/13 0534  WBC 10.2  --   --   HGB 9.1*  --   --   PLT 285  --   --   CREATININE  --  2.42* 1.96*   Estimated Creatinine Clearance: 45.8 ml/min (by C-G formula based on Cr of 1.96). No results found for this basename: VANCOTROUGH, Leodis Binet, VANCORANDOM, GENTTROUGH, GENTPEAK, GENTRANDOM, TOBRATROUGH, TOBRAPEAK, TOBRARND, AMIKACINPEAK, AMIKACINTROU, AMIKACIN,  in the last 72 hours   Microbiology: Recent Results (from the past 720 hour(s))  MRSA PCR SCREENING     Status: None   Collection Time    08/18/13  4:18 PM      Result Value Range Status   MRSA by PCR NEGATIVE  NEGATIVE Final   Comment:            The GeneXpert MRSA Assay (FDA     approved for NASAL specimens     only), is one component of a     comprehensive MRSA colonization     surveillance program. It is not     intended to diagnose MRSA     infection nor to guide or     monitor treatment for     MRSA infections.  URINE CULTURE     Status: None   Collection Time    08/18/13  5:13 PM      Result Value Range Status   Specimen Description URINE, CLEAN CATCH   Final   Special Requests NONE   Final   Culture  Setup Time     Final   Value: 08/19/2013 00:11     Performed at [facility] Count     Final   Value: 70,000  COLONIES/ML     Performed at [facility]   Culture     Final   Value: ESCHERICHIA COLI     Note: Confirmed Extended Spectrum Beta-Lactamase Producer (ESBL) CEFOXITIN SENSITIVE <=4     Performed at [facility]   Report Status 08/21/2013 FINAL   Final   Organism ID, Bacteria ESCHERICHIA COLI   Final  CULTURE, ROUTINE-ABSCESS     Status: None   Collection Time    08/18/13  6:49 PM      Result Value Range Status   Specimen Description ABSCESS LEFT KIDNEY   Final   Special Requests NONE   Final   Gram Stain     Final   Value: ABUNDANT WBC PRESENT,BOTH PMN AND MONONUCLEAR     NO SQUAMOUS EPITHELIAL CELLS SEEN     MODERATE GRAM NEGATIVE RODS     Performed at [facility]   Culture     Final   Value: ABUNDANT ESCHERICHIA COLI     Note: Confirmed Extended Spectrum Beta-Lactamase Producer (ESBL) CRITICAL RESULT CALLED TO, READ BACK BY AND VERIFIED WITH: ALBERT R 08/21/13 AT 810 BY [facility]  Performed at [facility]   Report Status 08/21/2013 FINAL   Final   Organism ID, Bacteria ESCHERICHIA COLI   Final  ANAEROBIC CULTURE     Status: None   Collection Time    08/18/13  6:54 PM      Result Value Range Status   Specimen Description ABSCESS LEFT KIDNEY   Final   Special Requests NONE   Final   Gram Stain     Final   Value: ABUNDANT WBC PRESENT,BOTH PMN AND MONONUCLEAR     NO SQUAMOUS EPITHELIAL CELLS SEEN     MODERATE GRAM NEGATIVE RODS     Performed at [facility]   Culture     Final   Value: NO ANAEROBES ISOLATED     Performed at [facility]   Report Status 08/23/2013 FINAL   Final  CULTURE, BLOOD (ROUTINE X 2)     Status: None   Collection Time    08/18/13  7:05 PM      Result Value Range Status   Specimen Description BLOOD RIGHT HAND   Final   Special Requests BOTTLES DRAWN AEROBIC AND ANAEROBIC 10CC   Final   Culture  Setup Time     Final   Value: 08/19/2013 00:10     Performed at [facility]   Culture     Final    Value: NO GROWTH 5 DAYS     Performed at [facility]   Report Status 08/24/2013 FINAL   Final  CULTURE, BLOOD (ROUTINE X 2)     Status: None   Collection Time    08/18/13  9:45 PM      Result Value Range Status   Specimen Description BLOOD LEFT HAND   Final   Special Requests BOTTLES DRAWN AEROBIC AND ANAEROBIC 10CC EA   Final   Culture  Setup Time     Final   Value: 08/19/2013 02:54     Performed at [facility]   Culture     Final   Value: NO GROWTH 5 DAYS     Performed at [facility]   Report Status 08/25/2013 FINAL   Final  CULTURE, BLOOD (ROUTINE X 2)     Status: None   Collection Time    08/20/13  4:40 PM      Result Value Range Status   Specimen Description BLOOD LEFT ARM   Final   Special Requests BOTTLES DRAWN AEROBIC ONLY 3CC   Final   Culture  Setup Time     Final   Value: 08/21/2013 02:13     Performed at [facility]   Culture     Final   Value: NO GROWTH 5 DAYS     Performed at [facility]   Report Status 08/27/2013 FINAL   Final  CULTURE, BLOOD (ROUTINE X 2)     Status: None   Collection Time    08/20/13  4:55 PM      Result Value Range Status   Specimen Description BLOOD RIGHT ARM   Final   Special Requests BOTTLES DRAWN AEROBIC ONLY 10CC   Final   Culture  Setup Time     Final   Value: 08/21/2013 02:13     Performed at [facility]   Culture     Final   Value: NO GROWTH 5 DAYS     Performed at [facility]   Report Status 08/27/2013 FINAL   Final  MRSA PCR SCREENING     Status: None   Collection Time    08/24/13  8:07 PM      Result Value Range Status   MRSA by PCR NEGATIVE  NEGATIVE Final   Comment:            The GeneXpert MRSA Assay (FDA     approved for NASAL specimens     only), is one component of a     comprehensive MRSA colonization     surveillance program. It is not     intended to diagnose MRSA     infection nor to guide or     monitor treatment for     MRSA infections.   BODY FLUID CULTURE     Status: None   Collection Time    08/25/13  3:36 PM      Result Value Range Status   Specimen Description PERITONEAL CAVITY   Final   Special Requests Normal   Final   Gram Stain     Final   Value: ABUNDANT WBC PRESENT,BOTH PMN AND MONONUCLEAR     NO ORGANISMS SEEN     Performed at [facility]   Culture     Final   Value: NO GROWTH 3 DAYS     Performed at [facility]   Report Status 08/29/2013 FINAL   Final    Medical History: Past Medical History  Diagnosis Date  . Arthritis   . Degenerative disc disease   . Degenerative joint disease   . Aortic aneurysm   . Pneumonia   . Hypercholesterolemia   . Hypertension   . CKD (chronic kidney disease), stage III    Assessment: 67 y/o M to continue on broad spectrum antibiotics.  Renal function improving.   Goal of Therapy:  Clinical resolution   Plan:  -Cont fluconazole 200 mg IV q24h -Primaxin changed to 500 mg IV q8 hours  Thank you for allowing me to take part in this patient's care,  Talbert Cage, PharmD Clinical Pharmacist Phone: [phone] 08/30/2013 9:06 AM

## 2013-08-30 NOTE — Progress Notes (Signed)
Physical Therapy Treatment Patient Details Name: Nathan Rivera MRN: [id number] DOB: 02-18-1946 Today's Date: 08/30/2013 Time: 3244-0102 PT Time Calculation (min): 15 min  PT Assessment / Plan / Recommendation  History of Present Illness pt presents with perforated duodenal ulcer, sepsis, renal abscess, and hydronephrosis.     PT Comments   Patient making slow gains with mobility and gait.  Follow Up Recommendations  Home health PT;Supervision/Assistance - 24 hour     Does the patient have the potential to tolerate intense rehabilitation     Barriers to Discharge        Equipment Recommendations  Rolling walker with 5" wheels    Recommendations for Other Services    Frequency Min 3X/week   Progress towards PT Goals Progress towards PT goals: Progressing toward goals  Plan Current plan remains appropriate    Precautions / Restrictions Precautions Precautions: Fall Precaution Comments: Drain on Lt side of back Restrictions Weight Bearing Restrictions: No   Pertinent Vitals/Pain     Mobility  Bed Mobility Bed Mobility: Sitting - Scoot to Edge of Bed;Left Sidelying to Sit;Rolling Left Rolling Left: 4: Min guard;With rail Left Sidelying to Sit: 4: Min guard;With rails;HOB elevated Sitting - Scoot to Edge of Bed: 4: Min guard Details for Bed Mobility Assistance: cues for log roll to minimize strain on abdomen.   Transfers Transfers: Sit to Stand;Stand to Sit Sit to Stand: 4: Min guard;With upper extremity assist;From bed Stand to Sit: 4: Min guard;With upper extremity assist;With armrests;To chair/3-in-1 Details for Transfer Assistance: Cues for hand placement and safety Ambulation/Gait Ambulation/Gait Assistance: 4: Min guard Ambulation Distance (Feet): 96 Feet Assistive device: Rolling walker Ambulation/Gait Assistance Details: Cues to stand upright during gait.  Patient demonstrates safe use of RW. Gait Pattern: Step-through pattern;Decreased stride length;Trunk flexed     Exercises General Exercises - Lower Extremity Ankle Circles/Pumps: AROM;Both;10 reps;Seated Long Arc Quad: AROM;Both;5 reps;Seated Hip Flexion/Marching: AROM;Both;5 reps;Seated     PT Goals (current goals can now be found in the care plan section)    Visit Information  Last PT Received On: 08/30/13 Assistance Needed: +1 History of Present Illness: pt presents with perforated duodenal ulcer, sepsis, renal abscess, and hydronephrosis.      Subjective Data  Subjective: "My legs are just so weak"  "It's hard to get started (walking)"   Cognition  Cognition Arousal/Alertness: Awake/alert Behavior During Therapy: WFL for tasks assessed/performed Overall Cognitive Status: Within Functional Limits for tasks assessed    Balance     End of Session PT - End of Session Equipment Utilized During Treatment: Gait belt Activity Tolerance: Patient limited by fatigue Patient left: in chair;with call bell/phone within reach Nurse Communication: Mobility status   GP     Vena Austria 08/30/2013, 4:21 PM Durenda Hurt. Renaldo Fiddler, [facility] Acute Rehab Services Pager [phone]

## 2013-08-31 LAB — BASIC METABOLIC PANEL
CO2: 19 mEq/L (ref 19–32)
Chloride: 113 mEq/L — ABNORMAL HIGH (ref 96–112)
GFR calc Af Amer: 49 mL/min — ABNORMAL LOW (ref >90)
GFR calc non Af Amer: 42 mL/min — ABNORMAL LOW (ref >90)
Sodium: 141 mEq/L (ref 135–145)

## 2013-08-31 NOTE — Progress Notes (Signed)
[facility]                                                                                Patient Demographics  Nathan Rivera, is a 67 y.o. male, DOB - [birth year], ZOX:[id number]  Admit date - 08/18/2013   Admitting Physician Nathan Otelia Sergeant, MD  Outpatient Primary MD for the patient is [facility], Nathan Guthrie, MD  LOS - 13   No chief complaint on file.       Assessment & Plan  Principal Problem:   Severe sepsis Active Problems:   PVD (peripheral vascular disease)   Hypertension   Acute renal failure   Hydronephrosis, left   Renal abscess   Acute respiratory failure with hypoxia   Dehydration   Bacteremia due to ESBL Escherichia coli  Perforated duodenal ulcer  -x ray on 11/27 showed mild ileus- but CT abd/pelvis 11/29 revealed free air  - underwent EL/graham patch closure of perf DU  -On full liquid diet and tolerating -continue antibiotics  Severe sepsis  -resolved, leukocytosis trending downward-9.3 -likely secondary to urinary tract involvement vs perf duodenal ulcer -Infectious disease following and recommends 10 days total of antibiotics (Imipenem and Diflucan),as well as 21 days of anbx from initial placement his renal drain on 11/23.  End antibiotics on 09/08/2013 -Will defer to surgery re: length of antibiotics post perforated viscus repair.   Hypernatremia/ Dehydration  -continue Dextrose IVF  -Na 140, trending downward, will continue to monitor  Hydronephrosis, left/Renal abscess/1 cm ureteral calculus  -post percutaneous nephrostomy tube  -Spoke with urology, Dr. Vernie Rivera, who recommended internalizing with stent placement done by interventional radiology -Interventional radiology may internalize tube.   -Plan for repeat CT abd (to plan staging of stone extraction) prior to d/c (pt and wife believe this will be around 12-8/9).   Bacteremia due to ESBL Escherichia coli  -initially treated with Cefepime  -final culture called 11/25 from Nathan Rivera with  E. Coli positive ESBL  -ID recommend change to Imipenem -Repeat blood cultures have remained negative  Acute renal failure  -Creatinine trending downward 1.63, will continue to monitor -Likely secondary to obstructive uropathy due to renal stone with hydronephrosis and infection  Acute respiratory failure with hypoxia  -due to sepsis, ARF and volume overload from prior resuscitative efforts  -stable on room air  PVD (peripheral vascular disease)  -Stable  Hypertension  -hold home meds for now  Code Status: Full  Family Communication: Wife at bedside.  Disposition Plan: Patient currently admitted. Will likely need home health on discharge. Currently being transitioned from NG tube to sips of clear liquids as dictated by surgery.  Procedures  -Status post left percutaneous nephrostomy -Exploratory laparotomy with patch closure/omental patch perforated duodenal ulcer with primary repair  Consults   PCCM ID IR [facility] Urology, Dr. Vernie Rivera, via phone  DVT Prophylaxis  Lovenox   Lab Results  Component Value Date   PLT 252 08/30/2013    Medications  Scheduled Meds: . antiseptic oral rinse  15 mL Mouth Rinse QID  . enoxaparin (LOVENOX) injection  40 mg Subcutaneous Daily  . fluconazole (DIFLUCAN) IV  200 mg Intravenous Q24H  . imipenem-cilastatin  500 mg Intravenous Q8H  .  pantoprazole (PROTONIX) IV  40 mg Intravenous Q12H   Continuous Infusions: . dextrose 5 % and 0.45 % NaCl with KCl 20 mEq/L 75 mL/hr at 08/30/13 1833   PRN Meds:.albuterol, fentaNYL, ondansetron (ZOFRAN) IV, ondansetron, sodium chloride, sodium chloride  Antibiotics    Anti-infectives   Start     Dose/Rate Route Frequency Ordered Stop   08/29/13 1230  imipenem-cilastatin (PRIMAXIN) 500 mg in sodium chloride 0.9 % 100 mL IVPB     500 mg 200 mL/hr over 30 Minutes Intravenous Every 8 hours 08/29/13 0802     08/27/13 0200  imipenem-cilastatin (PRIMAXIN) 250 mg in sodium chloride 0.9 % 100 mL IVPB   Status:  Discontinued     250 mg 200 mL/hr over 30 Minutes Intravenous Every 6 hours 08/27/13 0051 08/29/13 0802   08/26/13 1400  vancomycin (VANCOCIN) 1,500 mg in sodium chloride 0.9 % 500 mL IVPB  Status:  Discontinued     1,500 mg 250 mL/hr over 120 Minutes Intravenous Every 48 hours 08/24/13 1301 08/24/13 2004   08/25/13 1800  imipenem-cilastatin (PRIMAXIN) 250 mg in sodium chloride 0.9 % 100 mL IVPB  Status:  Discontinued     250 mg 200 mL/hr over 30 Minutes Intravenous Every 12 hours 08/24/13 1245 08/27/13 0051   08/24/13 2345  fluconazole (DIFLUCAN) IVPB 200 mg     200 mg 100 mL/hr over 60 Minutes Intravenous Every 24 hours 08/24/13 2332     08/24/13 1315  vancomycin (VANCOCIN) 2,250 mg in sodium chloride 0.9 % 500 mL IVPB  Status:  Discontinued     2,250 mg 250 mL/hr over 120 Minutes Intravenous  Once 08/24/13 1301 08/24/13 2004   08/20/13 1200  imipenem-cilastatin (PRIMAXIN) 250 mg in sodium chloride 0.9 % 100 mL IVPB  Status:  Discontinued     250 mg 200 mL/hr over 30 Minutes Intravenous 4 times per day 08/20/13 1132 08/24/13 1245   08/18/13 2200  ceFEPIme (MAXIPIME) 1 g in dextrose 5 % 50 mL IVPB  Status:  Discontinued     1 g 100 mL/hr over 30 Minutes Intravenous Every 12 hours 08/18/13 1846 08/18/13 1847   08/18/13 2000  ceFEPIme (MAXIPIME) 1 g in dextrose 5 % 50 mL IVPB  Status:  Discontinued     1 g 100 mL/hr over 30 Minutes Intravenous Every 24 hours 08/18/13 1728 08/18/13 1846   08/18/13 1900  ceFEPIme (MAXIPIME) 1 g in dextrose 5 % 50 mL IVPB  Status:  Discontinued     1 g 100 mL/hr over 30 Minutes Intravenous Every 24 hours 08/18/13 1847 08/20/13 1132       Time Spent in minutes   25 minutes   Nathan Rivera D.O. on 08/31/2013 at 11:07 AM  Between 7am to 7pm - Pager - [phone]  After 7pm go to [email or web address] - password [email or web address]  And look for the night coverage person covering for me after hours  [facility] Office  [phone]     Subjective:   Nathan Rivera seen and examined today.   Patient has no complaints at this time, wishes he could have a normal diet.  Patient denies dizziness, chest pain, shortness of breath, abdominal pain, N/V/D/C, new weakness, numbess, tingling.    Objective:   Filed Vitals:   08/30/13 0651 08/30/13 1522 08/30/13 2124 08/31/13 0534  BP: 116/74 108/74 120/77 102/65  Pulse: 78 87 82 75  Temp: 98.6 F (37 C) 97.9 F (36.6 C)  98.1 F (36.7 C)  TempSrc: Oral Oral  Oral  Resp: 16 17 20 18   Height:      Weight:      SpO2: 98% 97% 97% 96%    Wt Readings from Last 3 Encounters:  08/24/13 108.41 kg (239 lb)  08/24/13 108.41 kg (239 lb)  08/29/11 115.44 kg (254 lb 8 oz)     Intake/Output Summary (Last 24 hours) at 08/31/13 1107 Last data filed at 08/31/13 0957  Gross per 24 hour  Intake 2036.25 ml  Output   2470 ml  Net -433.75 ml    Exam  General: Well developed, well nourished, NAD, appears stated age  HEENT: NCAT, PERRLA, EOMI, Anicteic Sclera, mucous membranes moist. No pharyngeal erythema or exudates  Neck: Supple, no JVD, no masses  Cardiovascular: S1 S2 auscultated, no rubs, murmurs or gallops. Regular rate and rhythm.  Respiratory: Clear to auscultation bilaterally with equal chest rise  Abdomen: Soft, nontender, nondistended, + bowel sounds, right drain in place  Extremities: warm dry without cyanosis clubbing or edema  Neuro: AAOx3, cranial nerves grossly intact.   Skin: Without rashes exudates or nodules  Psych: Normal affect and demeanor with intact judgement and insight  Data Review   Micro Results Recent Results (from the past 240 hour(s))  MRSA PCR SCREENING     Status: None   Collection Time    08/24/13  8:07 PM      Result Value Range Status   MRSA by PCR NEGATIVE  NEGATIVE Final   Comment:            The GeneXpert MRSA Assay (FDA     approved for NASAL specimens     only), is one component of a     comprehensive MRSA colonization      surveillance program. It is not     intended to diagnose MRSA     infection nor to guide or     monitor treatment for     MRSA infections.  BODY FLUID CULTURE     Status: None   Collection Time    08/25/13  3:36 PM      Result Value Range Status   Specimen Description PERITONEAL CAVITY   Final   Special Requests Normal   Final   Gram Stain     Final   Value: ABUNDANT WBC PRESENT,BOTH PMN AND MONONUCLEAR     NO ORGANISMS SEEN     Performed at [facility]   Culture     Final   Value: NO GROWTH 3 DAYS     Performed at [facility]   Report Status 08/29/2013 FINAL   Final    Radiology Reports Ct Abdomen Pelvis Wo Contrast  08/24/2013   CLINICAL DATA:  Abdominal pain, sepsis, leukocytosis. "Coffee-ground" fluid draining from NG tube.  EXAM: CT ABDOMEN AND PELVIS WITHOUT CONTRAST  TECHNIQUE: Multidetector CT imaging of the abdomen and pelvis was performed following the standard protocol without intravenous contrast.  COMPARISON:  [facility] CT abdomen pelvis dated 08/18/2013  FINDINGS: Motion degraded images.  Small right pleural effusion. Associated lower lobe atelectasis bilaterally.  Unenhanced liver, pancreas, and adrenal glands are within normal limits.  Numerous splenic granulomata.  Interval placement of a left percutaneous nephrostomy catheter with decompression of the prior left hydronephrosis. Bilateral renal cysts, poorly evaluated in the absence of intravenous contrast. Stable 10 mm proximal left ureteral calculus (series 2/ image 64).  Atherosclerotic calcifications of the abdominal aorta and branch vessels. Aorto bi-iliac stent graft.  Moderate abdominopelvic ascites, new.  Interval development of moderate free intraperitoneal gas. This would not be accounted for by the nephrostomy placement, which was retroperitoneal. In the absence of interval intraperitoneal procedure, this is worrisome for a perforated hollow viscus. Given the presence of inflammatory stranding  (series 2/image 36) and localized gas near the gastric antrum (series 2/image 32), a perforated gastric ulcer is considered.  No suspicious abdominopelvic lymphadenopathy.  Prostate is unremarkable.  Mildly thick-walled bladder (Series 2/ image 99).  Mild degenerative changes of the visualized thoracolumbar spine.  IMPRESSION: Interval development of moderate free intraperitoneal gas, suggesting a perforated hollow viscus. Although the exact etiology is unclear, a perforated gastric ulcer is considered.  Moderate abdominopelvic ascites, new.  Interval placement of a left percutaneous nephrostomy catheter with decompression of prior left hydronephrosis. Stable 10 mm proximal left ureteral calculus.  Mildly thick-walled bladder, correlate for cystitis.  Critical value/emergent results were called by telephone at the time of interpretation on 08/24/2013 at 12:40 PM to Dr.JESSICA [facility] , who verbally acknowledged these results.   Electronically Signed   By: Charline Bills M.D.   On: 08/24/2013 12:44   Dg Chest 1 View  08/24/2013   CLINICAL DATA:  Decreasing oxygen saturation, abdominal pain  EXAM: CHEST - 1 VIEW  COMPARISON:  08/18/2013; 08/17/2013; CT abdomen pelvis -08/24/2013  FINDINGS: Grossly unchanged borderline enlarged cardiac silhouette and mediastinal contours. Interval placement of enteric tube tip and side port projecting below the left hemidiaphragm. Interval removal of left jugular approach central venous catheter. Overall improved aeration of the lungs. There is persistent mild elevation of the right hemidiaphragm and minimal bibasilar opacities, right greater than left. No new focal airspace opacities. Trace right-sided effusion is not excluded. No pneumothorax. Unchanged bones.  IMPRESSION: 1. Overall improved aeration of the lungs suggests resolving edema and/or atelectasis. 2. Persistent mild elevation of the right hemidiaphragm with bibasilar opacities, right greater than left, likely  atelectasis. No new focal airspace opacities. 3. Enteric tube tip and side port projects below the left hemidiaphragm.   Electronically Signed   By: Simonne Come M.D.   On: 08/24/2013 12:50   Ir Perc Nephrostomy Left  08/19/2013   CLINICAL DATA:  Septic shock, gram negative bacteremia and left hydronephrosis secondary to an obstructing ureteral calculus.  EXAM: 1. ULTRASOUND GUIDANCE FOR PUNCTURE OF THE LEFT RENAL COLLECTING SYSTEM. 2. LEFT PERCUTANEOUS NEPHROSTOMY TUBE PLACEMENT.  MEDICATIONS: No additional medications were administered.  ANESTHESIA/SEDATION: 1.0 mg IV Versed; 25 mcg IV Fentanyl.  Total Moderate Sedation Time  ONE 3 minutes  CONTRAST:  10 ml Omnipaque 300  COMPARISON:  None.  FLUOROSCOPY TIME:  30 seconds.  PROCEDURE: The procedure, risks, benefits, and alternatives were explained to the patient. Questions regarding the procedure were encouraged and answered. The patient understands and consents to the procedure.  The left flank region was prepped with Betadine in a sterile fashion, and a sterile drape was applied covering the operative field. A sterile gown and sterile gloves were used for the procedure. Local anesthesia was provided with 1% Lidocaine.  Ultrasound was used to localize the left kidney. Under direct ultrasound guidance, a 21 gauge needle was advanced into the renal collecting system. Ultrasound image documentation was performed. Aspiration of urine sample was performed followed by contrast injection. A sample of aspirated urine was sent for culture analysis.  A transitional dilator was advanced over a guidewire. Percutaneous tract dilatation was then performed over the guidewire. A 10 -French percutaneous nephrostomy tube was then advanced and formed in the collecting  system. Catheter position was confirmed by fluoroscopy after contrast injection.  The catheter was secured at the skin with a Prolene retention suture and Stat-Lock device. A gravity bag was placed.  COMPLICATIONS:  None.  FINDINGS: Ultrasound confirms significant left hydronephrosis. After needle puncture, there was immediate return of overtly purulent fluid consistent with pyonephrosis. After placement of the 10 French nephrostomy tubes, there is rapid drainage of purulent fluid.  IMPRESSION: Left percutaneous nephrostomy tube placement. There was evidence of overt pyonephrosis. A fluid sample was sent for culture analysis. A 10 French nephrostomy tube was placed, formed in the renal pelvis and connected to gravity bag drainage.   Electronically Signed   By: Irish Lack M.D.   On: 08/19/2013 19:58   Ir US Guide Bx Asp/drain  08/19/2013   CLINICAL DATA:  Septic shock, gram negative bacteremia and left hydronephrosis secondary to an obstructing ureteral calculus.  EXAM: 1. ULTRASOUND GUIDANCE FOR PUNCTURE OF THE LEFT RENAL COLLECTING SYSTEM. 2. LEFT PERCUTANEOUS NEPHROSTOMY TUBE PLACEMENT.  MEDICATIONS: No additional medications were administered.  ANESTHESIA/SEDATION: 1.0 mg IV Versed; 25 mcg IV Fentanyl.  Total Moderate Sedation Time  ONE 3 minutes  CONTRAST:  10 ml Omnipaque 300  COMPARISON:  None.  FLUOROSCOPY TIME:  30 seconds.  PROCEDURE: The procedure, risks, benefits, and alternatives were explained to the patient. Questions regarding the procedure were encouraged and answered. The patient understands and consents to the procedure.  The left flank region was prepped with Betadine in a sterile fashion, and a sterile drape was applied covering the operative field. A sterile gown and sterile gloves were used for the procedure. Local anesthesia was provided with 1% Lidocaine.  Ultrasound was used to localize the left kidney. Under direct ultrasound guidance, a 21 gauge needle was advanced into the renal collecting system. Ultrasound image documentation was performed. Aspiration of urine sample was performed followed by contrast injection. A sample of aspirated urine was sent for culture analysis.  A transitional  dilator was advanced over a guidewire. Percutaneous tract dilatation was then performed over the guidewire. A 10 -French percutaneous nephrostomy tube was then advanced and formed in the collecting system. Catheter position was confirmed by fluoroscopy after contrast injection.  The catheter was secured at the skin with a Prolene retention suture and Stat-Lock device. A gravity bag was placed.  COMPLICATIONS: None.  FINDINGS: Ultrasound confirms significant left hydronephrosis. After needle puncture, there was immediate return of overtly purulent fluid consistent with pyonephrosis. After placement of the 10 French nephrostomy tubes, there is rapid drainage of purulent fluid.  IMPRESSION: Left percutaneous nephrostomy tube placement. There was evidence of overt pyonephrosis. A fluid sample was sent for culture analysis. A 10 French nephrostomy tube was placed, formed in the renal pelvis and connected to gravity bag drainage.   Electronically Signed   By: Irish Lack M.D.   On: 08/19/2013 19:58   Dg Chest Port 1 View  08/26/2013   CLINICAL DATA:  Endotracheal tube removal  EXAM: PORTABLE CHEST - 1 VIEW  COMPARISON:  08/24/2013, 08/18/2013  FINDINGS: There has been interval removal of the endotracheal tube. There is a nasogastric tube in unchanged position. There is elevation of the right diaphragm. There is left basilar atelectasis versus scarring. There is no focal consolidation, pleural effusion or pneumothorax. Normal cardiomediastinal silhouette. The osseous structures are unremarkable.  IMPRESSION: Interval removal of the endotracheal tube. Otherwise no significant interval change.   Electronically Signed   By: Elige Ko   On: 08/26/2013 08:15  Portable Chest Xray  08/24/2013   CLINICAL DATA:  Endotracheal tube placement.  EXAM: PORTABLE CHEST - 1 VIEW  COMPARISON:  08/24/2013 at 1143 hr wound.  FINDINGS: Endotracheal tube is been placed with tip measuring 4.7 cm above the carinal. Enteric tube  remains in place without evidence of change. The tip is off the field of view. There is shallow inspiration with linear atelectasis developing in both lung bases. Probable small right pleural effusion. Normal heart size and pulmonary vascularity. No pneumothorax.  IMPRESSION: Endotracheal tube tip measures about 4.7 cm above the carina. Increasing atelectasis in the lung bases. Probable small right pleural effusion.   Electronically Signed   By: Burman Nieves M.D.   On: 08/24/2013 22:21   Dg Chest Port 1 View  08/18/2013   CLINICAL DATA:  Central line placement  EXAM: PORTABLE CHEST - 1 VIEW  COMPARISON:  08/17/2013  FINDINGS: Left internal jugular central line has been placed with tip over the anticipated position of the junction of the brachiocephalic vein and superior vena cava. No pneumothorax. Heart size is normal. Lungs are clear.  IMPRESSION: Central line as described above with no pneumothorax   Electronically Signed   By: Esperanza Heir M.D.   On: 08/18/2013 20:58   Dg Abd 2 Views  08/22/2013   CLINICAL DATA:  Generalized abdominal pain  EXAM: ABDOMEN - 2 VIEW  COMPARISON:  08/18/2013  FINDINGS: There is nonobstructive bowel gas pattern. 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 Left nephrostomy catheter. No free abdominal air. Mild gaseous distended small bowel loops mid abdomen probable mild ileus.  IMPRESSION: Mild gaseous distended small bowel loops mid abdomen probable mild ileus. No air-fluid levels. Left nephrostomy catheter. No free abdominal air.   Electronically Signed   By: Natasha Mead M.D.   On: 08/22/2013 12:18   Dg Kayleen Memos W/water Sol Cm  08/28/2013   CLINICAL DATA:  Evaluate duodenal leak following Cheree Ditto patch placement.  EXAM: WATER SOLUBLE UPPER GI SERIES  TECHNIQUE: Single-column upper GI series was performed using water soluble contrast.  CONTRAST:  150 mL Omnipaque 300  COMPARISON:  None.  FLUOROSCOPY TIME:  52 seconds  FINDINGS: A preprocedural KUB was performed  demonstrating the nasogastric tube with the tip projecting over the stomach, but the proximal port is proximal to the esophagogastric junction. Recommend advancing the nasogastric tube 10 cm.  150 mL Omnipaque 300 was hand injected through an existing nasogastric tube. Following injection of contrast the stomach and duodenum was evaluated under realtime fluoroscopy. The patient was placed in the supine and right and left decubitus positions. Contrast is observed in the gastric antrum traversing the duodenum and into the proximal jejunum. There is no extravasation of contrast. Following the fluoroscopic evaluation a postprocedural KUB was performed demonstrating no extraluminal contrast.  There is reflux of contrast into the esophagus.  IMPRESSION: No extraluminal contrast identified to suggest a gastric or duodenal leak.  Nasogastric tube with the tip projecting over the stomach, but the proximal port is proximal to the esophagogastric junction. Recommend advancing the nasogastric tube 10 cm.   Electronically Signed   By: Elige Ko   On: 08/28/2013 09:09    CBC  Recent Labs Lab 08/25/13 0404 08/26/13 0421 08/28/13 0500 08/30/13 0820  WBC 26.6* 15.6* 10.2 9.4  HGB 9.4* 9.3* 9.1* 9.3*  HCT 27.5* 27.4* 28.2* 29.3*  PLT 321 328 285 252  MCV 86.8 87.0 89.5 91.0  MCH 29.7 29.5 28.9 28.9  MCHC 34.2  33.9 32.3 31.7  RDW 18.3* 18.4* 18.9* 18.9*    Chemistries   Recent Labs Lab 08/25/13 0404 08/26/13 0421 08/28/13 0858 08/29/13 0534 08/30/13 0820  NA 148* 154* 153* 147* 140  K 4.9 4.5 4.3 3.8 4.3  CL 119* 126* 125* 119* 116*  CO2 14* 14* 18* 18* 13*  GLUCOSE 99 112* 106* 96 92  BUN 102* 95* 58* 43* 31*  CREATININE 4.59* 4.07* 2.42* 1.96* 1.63*  CALCIUM 7.4* 7.7* 8.1* 7.4* 7.5*   ------------------------------------------------------------------------------------------------------------------ estimated creatinine clearance is 55 ml/min (by C-G formula based on Cr of  1.63). ------------------------------------------------------------------------------------------------------------------ No results found for this basename: HGBA1C,  in the last 72 hours ------------------------------------------------------------------------------------------------------------------ No results found for this basename: CHOL, HDL, LDLCALC, TRIG, CHOLHDL, LDLDIRECT,  in the last 72 hours ------------------------------------------------------------------------------------------------------------------ No results found for this basename: TSH, T4TOTAL, FREET3, T3FREE, THYROIDAB,  in the last 72 hours ------------------------------------------------------------------------------------------------------------------ No results found for this basename: VITAMINB12, FOLATE, FERRITIN, TIBC, IRON, RETICCTPCT,  in the last 72 hours  Coagulation profile No results found for this basename: INR, PROTIME,  in the last 168 hours  No results found for this basename: DDIMER,  in the last 72 hours  Cardiac Enzymes No results found for this basename: CK, CKMB, TROPONINI, MYOGLOBIN,  in the last 168 hours ------------------------------------------------------------------------------------------------------------------ No components found with this basename: POCBNP,

## 2013-08-31 NOTE — Progress Notes (Addendum)
7 Days Post-Op  Subjective: Not much energy but tolerating full liquids  Objective: Vital signs in last 24 hours: Temp:  [97.9 F (36.6 C)-98.1 F (36.7 C)] 98.1 F (36.7 C) (12/06 0534) Pulse Rate:  [75-87] 75 (12/06 0534) Resp:  [17-20] 18 (12/06 0534) BP: (102-120)/(65-77) 102/65 mmHg (12/06 0534) SpO2:  [96 %-97 %] 96 % (12/06 0534) Last BM Date: 08/18/13  Intake/Output from previous day: 12/05 0701 - 12/06 0700 In: 1796.3 [I.V.:1396.3; IV Piggyback:400] Out: 2015 [Urine:1925; Drains:90] Intake/Output this shift: Total I/O In: 240 [P.O.:240] Out: 560 [Urine:550; Drains:10]  General appearance: alert, cooperative and no distress GI: soft, mild tenderness, ND, wound packed, no sign of infection, JP serous  Lab Results:   Recent Labs  08/30/13 0820  WBC 9.4  HGB 9.3*  HCT 29.3*  PLT 252   BMET  Recent Labs  08/30/13 0820 08/31/13 1014  NA 140 141  K 4.3 4.7  CL 116* 113*  CO2 13* 19  GLUCOSE 92 110*  BUN 31* 23  CREATININE 1.63* 1.63*  CALCIUM 7.5* 7.7*   PT/INR No results found for this basename: LABPROT, INR,  in the last 72 hours ABG No results found for this basename: PHART, PCO2, PO2, HCO3,  in the last 72 hours  Studies/Results: No results found.  Anti-infectives: Anti-infectives   Start     Dose/Rate Route Frequency Ordered Stop   08/29/13 1230  imipenem-cilastatin (PRIMAXIN) 500 mg in sodium chloride 0.9 % 100 mL IVPB     500 mg 200 mL/hr over 30 Minutes Intravenous Every 8 hours 08/29/13 0802     08/27/13 0200  imipenem-cilastatin (PRIMAXIN) 250 mg in sodium chloride 0.9 % 100 mL IVPB  Status:  Discontinued     250 mg 200 mL/hr over 30 Minutes Intravenous Every 6 hours 08/27/13 0051 08/29/13 0802   08/26/13 1400  vancomycin (VANCOCIN) 1,500 mg in sodium chloride 0.9 % 500 mL IVPB  Status:  Discontinued     1,500 mg 250 mL/hr over 120 Minutes Intravenous Every 48 hours 08/24/13 1301 08/24/13 2004   08/25/13 1800  imipenem-cilastatin  (PRIMAXIN) 250 mg in sodium chloride 0.9 % 100 mL IVPB  Status:  Discontinued     250 mg 200 mL/hr over 30 Minutes Intravenous Every 12 hours 08/24/13 1245 08/27/13 0051   08/24/13 2345  fluconazole (DIFLUCAN) IVPB 200 mg     200 mg 100 mL/hr over 60 Minutes Intravenous Every 24 hours 08/24/13 2332     08/24/13 1315  vancomycin (VANCOCIN) 2,250 mg in sodium chloride 0.9 % 500 mL IVPB  Status:  Discontinued     2,250 mg 250 mL/hr over 120 Minutes Intravenous  Once 08/24/13 1301 08/24/13 2004   08/20/13 1200  imipenem-cilastatin (PRIMAXIN) 250 mg in sodium chloride 0.9 % 100 mL IVPB  Status:  Discontinued     250 mg 200 mL/hr over 30 Minutes Intravenous 4 times per day 08/20/13 1132 08/24/13 1245   08/18/13 2200  ceFEPIme (MAXIPIME) 1 g in dextrose 5 % 50 mL IVPB  Status:  Discontinued     1 g 100 mL/hr over 30 Minutes Intravenous Every 12 hours 08/18/13 1846 08/18/13 1847   08/18/13 2000  ceFEPIme (MAXIPIME) 1 g in dextrose 5 % 50 mL IVPB  Status:  Discontinued     1 g 100 mL/hr over 30 Minutes Intravenous Every 24 hours 08/18/13 1728 08/18/13 1846   08/18/13 1900  ceFEPIme (MAXIPIME) 1 g in dextrose 5 % 50 mL IVPB  Status:  Discontinued     1 g 100 mL/hr over 30 Minutes Intravenous Every 24 hours 08/18/13 1847 08/20/13 1132      Assessment/Plan: s/p Procedure(s) with comments: EXPLORATORY LAPAROTOMY (N/A) - Gram patch closure Advance diet remove drain if still serous output and tolerating diet.  LOS: 13 days    Nathan Rivera 08/31/2013  Called back to the room for rectal bleeding.  He has a very slight (barely visible) amount of blood on the pad where he was sitting.  Rectal with some hemorrhoids but no sign of rectal bleeding.  HD stable and HGB as been okay.  Will follow this for now.

## 2013-09-01 ENCOUNTER — Inpatient Hospital Stay (HOSPITAL_COMMUNITY): Payer: Medicare Other

## 2013-09-01 LAB — CBC
HCT: 29.7 % — ABNORMAL LOW (ref 39.0–52.0)
Hemoglobin: 9.4 g/dL — ABNORMAL LOW (ref 13.0–17.0)
MCV: 92.2 fL (ref 78.0–100.0)
RBC: 3.22 MIL/uL — ABNORMAL LOW (ref 4.22–5.81)
WBC: 11.3 10*3/uL — ABNORMAL HIGH (ref 4.0–10.5)

## 2013-09-01 LAB — BASIC METABOLIC PANEL
BUN: 19 mg/dL (ref 6–23)
CO2: 19 mEq/L (ref 19–32)
Chloride: 111 mEq/L (ref 96–112)
Creatinine, Ser: 1.56 mg/dL — ABNORMAL HIGH (ref 0.50–1.35)
Sodium: 138 mEq/L (ref 135–145)

## 2013-09-01 MED ORDER — PANTOPRAZOLE SODIUM 40 MG PO TBEC
40.0000 mg | DELAYED_RELEASE_TABLET | Freq: Two times a day (BID) | ORAL | Status: DC
  Administered 2013-09-01 – 2013-09-04 (×7): 40 mg via ORAL
  Filled 2013-09-01 (×7): qty 1

## 2013-09-01 NOTE — Progress Notes (Signed)
[facility]                                                                                Patient Demographics  Nathan Rivera, is a 67 y.o. male, DOB - [birth year], ZOX:[id number]  Admit date - 08/18/2013   Admitting Physician Nathan Otelia Sergeant, MD  Outpatient Primary MD for the patient is [facility], Nathan Guthrie, MD  LOS - 14   No chief complaint on file.       Assessment & Plan  Principal Problem:   Severe sepsis Active Problems:   PVD (peripheral vascular disease)   Hypertension   Acute renal failure   Hydronephrosis, left   Renal abscess   Acute respiratory failure with hypoxia   Dehydration   Bacteremia due to ESBL Escherichia coli  Perforated duodenal ulcer  -x ray on 11/27 showed mild ileus- but CT abd/pelvis 11/29 revealed free air  - underwent EL/graham patch closure of perf DU  -Patient currently on regular diet, tolerating small amounts well. -continue antibiotics  Severe sepsis  -resolved, leukocytosis trending downward-9.3 -likely secondary to urinary tract involvement vs perf duodenal ulcer -Infectious disease following and recommends 10 days total of antibiotics (Imipenem and Diflucan),as well as 21 days of anbx from initial placement his renal drain on 11/23.  End antibiotics on 09/08/2013 -Will defer to surgery re: length of antibiotics post perforated viscus repair.   Hypernatremia/ Dehydration  -Na 138, trending downward, will continue to monitor -Will discontinue IV fluids  Hydronephrosis, left/Renal abscess/1 cm ureteral calculus  -post percutaneous nephrostomy tube  -Spoke with urology, Dr. Vernie Rivera, who recommended internalizing with stent placement done by interventional radiology -Interventional radiology may internalize tube.   -Plan for repeat CT abd (to plan staging of stone extraction) prior to d/c (pt and wife believe this will be around 12-8/9).   Bacteremia due to ESBL Escherichia coli  -initially treated with Cefepime  -final  culture called 11/25 from Nathan Rivera with E. Coli positive ESBL  -ID recommend change to Imipenem -Repeat blood cultures have remained negative  Acute renal failure  -Creatinine trending downward 1.56, will continue to monitor -Likely secondary to obstructive uropathy due to renal stone with hydronephrosis and infection  Acute respiratory failure with hypoxia  -due to sepsis, ARF and volume overload from prior resuscitative efforts  -stable on room air  PVD (peripheral vascular disease)  -Stable  Hypertension  -hold home meds for now  Anemia -Likely secondary to surgery -H/H 9.4/29.7, currently stable.  Will continue to monitor. -No signs of active bleeding.   Code Status: Full  Family Communication: Wife at bedside.  Disposition Plan: Patient currently admitted. Will likely need home health on discharge. Currently being transitioned from NG tube to sips of clear liquids as dictated by surgery.  Procedures  -Status post left percutaneous nephrostomy -Exploratory laparotomy with patch closure/omental patch perforated duodenal ulcer with primary repair  Consults   P[facility]M ID [facility], Dr. Vernie Rivera, via phone  DVT Prophylaxis  Lovenox   Lab Results  Component Value Date   PLT 268 09/01/2013    Medications  Scheduled Meds: . antiseptic oral rinse  15 mL Mouth Rinse QID  . enoxaparin (LOVENOX) injection  40 mg Subcutaneous Daily  . fluconazole (DIFLUCAN) IV  200 mg Intravenous Q24H  . imipenem-cilastatin  500 mg Intravenous Q8H  . pantoprazole  40 mg Oral Q12H   Continuous Infusions: . dextrose 5 % and 0.45 % NaCl with KCl 20 mEq/L 75 mL/hr (08/31/13 2358)   PRN Meds:.albuterol, fentaNYL, ondansetron (ZOFRAN) IV, ondansetron, sodium chloride, sodium chloride  Antibiotics    Anti-infectives   Start     Dose/Rate Route Frequency Ordered Stop   08/29/13 1230  imipenem-cilastatin (PRIMAXIN) 500 mg in sodium chloride 0.9 % 100 mL IVPB     500 mg 200 mL/hr over  30 Minutes Intravenous Every 8 hours 08/29/13 0802     08/27/13 0200  imipenem-cilastatin (PRIMAXIN) 250 mg in sodium chloride 0.9 % 100 mL IVPB  Status:  Discontinued     250 mg 200 mL/hr over 30 Minutes Intravenous Every 6 hours 08/27/13 0051 08/29/13 0802   08/26/13 1400  vancomycin (VANCOCIN) 1,500 mg in sodium chloride 0.9 % 500 mL IVPB  Status:  Discontinued     1,500 mg 250 mL/hr over 120 Minutes Intravenous Every 48 hours 08/24/13 1301 08/24/13 2004   08/25/13 1800  imipenem-cilastatin (PRIMAXIN) 250 mg in sodium chloride 0.9 % 100 mL IVPB  Status:  Discontinued     250 mg 200 mL/hr over 30 Minutes Intravenous Every 12 hours 08/24/13 1245 08/27/13 0051   08/24/13 2345  fluconazole (DIFLUCAN) IVPB 200 mg     200 mg 100 mL/hr over 60 Minutes Intravenous Every 24 hours 08/24/13 2332     08/24/13 1315  vancomycin (VANCOCIN) 2,250 mg in sodium chloride 0.9 % 500 mL IVPB  Status:  Discontinued     2,250 mg 250 mL/hr over 120 Minutes Intravenous  Once 08/24/13 1301 08/24/13 2004   08/20/13 1200  imipenem-cilastatin (PRIMAXIN) 250 mg in sodium chloride 0.9 % 100 mL IVPB  Status:  Discontinued     250 mg 200 mL/hr over 30 Minutes Intravenous 4 times per day 08/20/13 1132 08/24/13 1245   08/18/13 2200  ceFEPIme (MAXIPIME) 1 g in dextrose 5 % 50 mL IVPB  Status:  Discontinued     1 g 100 mL/hr over 30 Minutes Intravenous Every 12 hours 08/18/13 1846 08/18/13 1847   08/18/13 2000  ceFEPIme (MAXIPIME) 1 g in dextrose 5 % 50 mL IVPB  Status:  Discontinued     1 g 100 mL/hr over 30 Minutes Intravenous Every 24 hours 08/18/13 1728 08/18/13 1846   08/18/13 1900  ceFEPIme (MAXIPIME) 1 g in dextrose 5 % 50 mL IVPB  Status:  Discontinued     1 g 100 mL/hr over 30 Minutes Intravenous Every 24 hours 08/18/13 1847 08/20/13 1132       Time Spent in minutes   25 minutes   Nathan Rivera D.O. on 09/01/2013 at 11:25 AM  Between 7am to 7pm - Pager - [phone]  After 7pm go to [email or web address] -  password [email or web address]  And look for the night coverage person covering for me after hours  [facility] Office  [phone]    Subjective:   Nathan Rivera seen and examined today.   Patient has no complaints at this time.  States he was able to tolerate small amounts of food.  Wishes to go home. Patient denies dizziness, chest pain, shortness of breath, abdominal pain, N/V/D/C, new weakness, numbess, tingling.    Objective:   Filed Vitals:   08/31/13 0534 08/31/13 1429 08/31/13 2206 09/01/13 0625  BP: 102/65  112/72 100/54 106/67  Pulse: 75 81 89 84  Temp: 98.1 F (36.7 C) 98.4 F (36.9 C) 98.2 F (36.8 C) 98.2 F (36.8 C)  TempSrc: Oral Oral Oral Oral  Resp: 18 18 18 18   Height:      Weight:      SpO2: 96% 98% 96% 96%    Wt Readings from Last 3 Encounters:  08/24/13 108.41 kg (239 lb)  08/24/13 108.41 kg (239 lb)  08/29/11 115.44 kg (254 lb 8 oz)     Intake/Output Summary (Last 24 hours) at 09/01/13 1125 Last data filed at 09/01/13 1056  Gross per 24 hour  Intake 1707.5 ml  Output   1150 ml  Net  557.5 ml    Exam  General: Well developed, well nourished, NAD, appears stated age  HEENT: NCAT, PERRLA, EOMI, Anicteic Sclera, mucous membranes moist. No pharyngeal erythema or exudates  Neck: Supple, no JVD, no masses  Cardiovascular: S1 S2 auscultated, no rubs, murmurs or gallops. Regular rate and rhythm.  Respiratory: Clear to auscultation bilaterally with equal chest rise  Abdomen: Soft, nontender, nondistended, + bowel sounds, right drain in place  Extremities: warm dry without cyanosis clubbing or edema  Neuro: AAOx3, cranial nerves grossly intact.   Skin: Without rashes exudates or nodules  Psych: Normal affect and demeanor with intact judgement and insight  Data Review   Micro Results Recent Results (from the past 240 hour(s))  MRSA PCR SCREENING     Status: None   Collection Time    08/24/13  8:07 PM      Result Value Range Status   MRSA  by PCR NEGATIVE  NEGATIVE Final   Comment:            The GeneXpert MRSA Assay (FDA     approved for NASAL specimens     only), is one component of a     comprehensive MRSA colonization     surveillance program. It is not     intended to diagnose MRSA     infection nor to guide or     monitor treatment for     MRSA infections.  BODY FLUID CULTURE     Status: None   Collection Time    08/25/13  3:36 PM      Result Value Range Status   Specimen Description PERITONEAL CAVITY   Final   Special Requests Normal   Final   Gram Stain     Final   Value: ABUNDANT WBC PRESENT,BOTH PMN AND MONONUCLEAR     NO ORGANISMS SEEN     Performed at [facility]   Culture     Final   Value: NO GROWTH 3 DAYS     Performed at [facility]   Report Status 08/29/2013 FINAL   Final    Radiology Reports Ct Abdomen Pelvis Wo Contrast  08/24/2013   CLINICAL DATA:  Abdominal pain, sepsis, leukocytosis. "Coffee-ground" fluid draining from NG tube.  EXAM: CT ABDOMEN AND PELVIS WITHOUT CONTRAST  TECHNIQUE: Multidetector CT imaging of the abdomen and pelvis was performed following the standard protocol without intravenous contrast.  COMPARISON:  [facility] CT abdomen pelvis dated 08/18/2013  FINDINGS: Motion degraded images.  Small right pleural effusion. Associated lower lobe atelectasis bilaterally.  Unenhanced liver, pancreas, and adrenal glands are within normal limits.  Numerous splenic granulomata.  Interval placement of a left percutaneous nephrostomy catheter with decompression of the prior left hydronephrosis. Bilateral renal cysts, poorly evaluated in the absence of  intravenous contrast. Stable 10 mm proximal left ureteral calculus (series 2/ image 64).  Atherosclerotic calcifications of the abdominal aorta and branch vessels. Aorto bi-iliac stent graft.  Moderate abdominopelvic ascites, new.  Interval development of moderate free intraperitoneal gas. This would not be accounted for by the nephrostomy  placement, which was retroperitoneal. In the absence of interval intraperitoneal procedure, this is worrisome for a perforated hollow viscus. Given the presence of inflammatory stranding (series 2/image 36) and localized gas near the gastric antrum (series 2/image 32), a perforated gastric ulcer is considered.  No suspicious abdominopelvic lymphadenopathy.  Prostate is unremarkable.  Mildly thick-walled bladder (Series 2/ image 99).  Mild degenerative changes of the visualized thoracolumbar spine.  IMPRESSION: Interval development of moderate free intraperitoneal gas, suggesting a perforated hollow viscus. Although the exact etiology is unclear, a perforated gastric ulcer is considered.  Moderate abdominopelvic ascites, new.  Interval placement of a left percutaneous nephrostomy catheter with decompression of prior left hydronephrosis. Stable 10 mm proximal left ureteral calculus.  Mildly thick-walled bladder, correlate for cystitis.  Critical value/emergent results were called by telephone at the time of interpretation on 08/24/2013 at 12:40 PM to Dr.JESSICA [facility] , who verbally acknowledged these results.   Electronically Signed   By: Charline Bills M.D.   On: 08/24/2013 12:44   Dg Chest 1 View  08/24/2013   CLINICAL DATA:  Decreasing oxygen saturation, abdominal pain  EXAM: CHEST - 1 VIEW  COMPARISON:  08/18/2013; 08/17/2013; CT abdomen pelvis -08/24/2013  FINDINGS: Grossly unchanged borderline enlarged cardiac silhouette and mediastinal contours. Interval placement of enteric tube tip and side port projecting below the left hemidiaphragm. Interval removal of left jugular approach central venous catheter. Overall improved aeration of the lungs. There is persistent mild elevation of the right hemidiaphragm and minimal bibasilar opacities, right greater than left. No new focal airspace opacities. Trace right-sided effusion is not excluded. No pneumothorax. Unchanged bones.  IMPRESSION: 1. Overall improved  aeration of the lungs suggests resolving edema and/or atelectasis. 2. Persistent mild elevation of the right hemidiaphragm with bibasilar opacities, right greater than left, likely atelectasis. No new focal airspace opacities. 3. Enteric tube tip and side port projects below the left hemidiaphragm.   Electronically Signed   By: Simonne Come M.D.   On: 08/24/2013 12:50   Ir Perc Nephrostomy Left  08/19/2013   CLINICAL DATA:  Septic shock, gram negative bacteremia and left hydronephrosis secondary to an obstructing ureteral calculus.  EXAM: 1. ULTRASOUND GUIDANCE FOR PUNCTURE OF THE LEFT RENAL COLLECTING SYSTEM. 2. LEFT PERCUTANEOUS NEPHROSTOMY TUBE PLACEMENT.  MEDICATIONS: No additional medications were administered.  ANESTHESIA/SEDATION: 1.0 mg IV Versed; 25 mcg IV Fentanyl.  Total Moderate Sedation Time  ONE 3 minutes  CONTRAST:  10 ml Omnipaque 300  COMPARISON:  None.  FLUOROSCOPY TIME:  30 seconds.  PROCEDURE: The procedure, risks, benefits, and alternatives were explained to the patient. Questions regarding the procedure were encouraged and answered. The patient understands and consents to the procedure.  The left flank region was prepped with Betadine in a sterile fashion, and a sterile drape was applied covering the operative field. A sterile gown and sterile gloves were used for the procedure. Local anesthesia was provided with 1% Lidocaine.  Ultrasound was used to localize the left kidney. Under direct ultrasound guidance, a 21 gauge needle was advanced into the renal collecting system. Ultrasound image documentation was performed. Aspiration of urine sample was performed followed by contrast injection. A sample of aspirated urine was sent for culture  analysis.  A transitional dilator was advanced over a guidewire. Percutaneous tract dilatation was then performed over the guidewire. A 10 -French percutaneous nephrostomy tube was then advanced and formed in the collecting system. Catheter position was  confirmed by fluoroscopy after contrast injection.  The catheter was secured at the skin with a Prolene retention suture and Stat-Lock device. A gravity bag was placed.  COMPLICATIONS: None.  FINDINGS: Ultrasound confirms significant left hydronephrosis. After needle puncture, there was immediate return of overtly purulent fluid consistent with pyonephrosis. After placement of the 10 French nephrostomy tubes, there is rapid drainage of purulent fluid.  IMPRESSION: Left percutaneous nephrostomy tube placement. There was evidence of overt pyonephrosis. A fluid sample was sent for culture analysis. A 10 French nephrostomy tube was placed, formed in the renal pelvis and connected to gravity bag drainage.   Electronically Signed   By: Irish Lack M.D.   On: 08/19/2013 19:58   Ir US Guide Bx Asp/drain  08/19/2013   CLINICAL DATA:  Septic shock, gram negative bacteremia and left hydronephrosis secondary to an obstructing ureteral calculus.  EXAM: 1. ULTRASOUND GUIDANCE FOR PUNCTURE OF THE LEFT RENAL COLLECTING SYSTEM. 2. LEFT PERCUTANEOUS NEPHROSTOMY TUBE PLACEMENT.  MEDICATIONS: No additional medications were administered.  ANESTHESIA/SEDATION: 1.0 mg IV Versed; 25 mcg IV Fentanyl.  Total Moderate Sedation Time  ONE 3 minutes  CONTRAST:  10 ml Omnipaque 300  COMPARISON:  None.  FLUOROSCOPY TIME:  30 seconds.  PROCEDURE: The procedure, risks, benefits, and alternatives were explained to the patient. Questions regarding the procedure were encouraged and answered. The patient understands and consents to the procedure.  The left flank region was prepped with Betadine in a sterile fashion, and a sterile drape was applied covering the operative field. A sterile gown and sterile gloves were used for the procedure. Local anesthesia was provided with 1% Lidocaine.  Ultrasound was used to localize the left kidney. Under direct ultrasound guidance, a 21 gauge needle was advanced into the renal collecting system. Ultrasound  image documentation was performed. Aspiration of urine sample was performed followed by contrast injection. A sample of aspirated urine was sent for culture analysis.  A transitional dilator was advanced over a guidewire. Percutaneous tract dilatation was then performed over the guidewire. A 10 -French percutaneous nephrostomy tube was then advanced and formed in the collecting system. Catheter position was confirmed by fluoroscopy after contrast injection.  The catheter was secured at the skin with a Prolene retention suture and Stat-Lock device. A gravity bag was placed.  COMPLICATIONS: None.  FINDINGS: Ultrasound confirms significant left hydronephrosis. After needle puncture, there was immediate return of overtly purulent fluid consistent with pyonephrosis. After placement of the 10 French nephrostomy tubes, there is rapid drainage of purulent fluid.  IMPRESSION: Left percutaneous nephrostomy tube placement. There was evidence of overt pyonephrosis. A fluid sample was sent for culture analysis. A 10 French nephrostomy tube was placed, formed in the renal pelvis and connected to gravity bag drainage.   Electronically Signed   By: Irish Lack M.D.   On: 08/19/2013 19:58   Dg Chest Port 1 View  08/26/2013   CLINICAL DATA:  Endotracheal tube removal  EXAM: PORTABLE CHEST - 1 VIEW  COMPARISON:  08/24/2013, 08/18/2013  FINDINGS: There has been interval removal of the endotracheal tube. There is a nasogastric tube in unchanged position. There is elevation of the right diaphragm. There is left basilar atelectasis versus scarring. There is no focal consolidation, pleural effusion or pneumothorax. Normal cardiomediastinal silhouette.  The osseous structures are unremarkable.  IMPRESSION: Interval removal of the endotracheal tube. Otherwise no significant interval change.   Electronically Signed   By: Elige Ko   On: 08/26/2013 08:15   Portable Chest Xray  08/24/2013   CLINICAL DATA:  Endotracheal tube  placement.  EXAM: PORTABLE CHEST - 1 VIEW  COMPARISON:  08/24/2013 at 1143 hr wound.  FINDINGS: Endotracheal tube is been placed with tip measuring 4.7 cm above the carinal. Enteric tube remains in place without evidence of change. The tip is off the field of view. There is shallow inspiration with linear atelectasis developing in both lung bases. Probable small right pleural effusion. Normal heart size and pulmonary vascularity. No pneumothorax.  IMPRESSION: Endotracheal tube tip measures about 4.7 cm above the carina. Increasing atelectasis in the lung bases. Probable small right pleural effusion.   Electronically Signed   By: Burman Nieves M.D.   On: 08/24/2013 22:21   Dg Chest Port 1 View  08/18/2013   CLINICAL DATA:  Central line placement  EXAM: PORTABLE CHEST - 1 VIEW  COMPARISON:  08/17/2013  FINDINGS: Left internal jugular central line has been placed with tip over the anticipated position of the junction of the brachiocephalic vein and superior vena cava. No pneumothorax. Heart size is normal. Lungs are clear.  IMPRESSION: Central line as described above with no pneumothorax   Electronically Signed   By: Esperanza Heir M.D.   On: 08/18/2013 20:58   Dg Abd 2 Views  08/22/2013   CLINICAL DATA:  Generalized abdominal pain  EXAM: ABDOMEN - 2 VIEW  COMPARISON:  08/18/2013  FINDINGS: There is nonobstructive bowel gas pattern. 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 Left nephrostomy catheter. No free abdominal air. Mild gaseous distended small bowel loops mid abdomen probable mild ileus.  IMPRESSION: Mild gaseous distended small bowel loops mid abdomen probable mild ileus. No air-fluid levels. Left nephrostomy catheter. No free abdominal air.   Electronically Signed   By: Natasha Mead M.D.   On: 08/22/2013 12:18   Dg Kayleen Memos W/water Sol Cm  08/28/2013   CLINICAL DATA:  Evaluate duodenal leak following Cheree Ditto patch placement.  EXAM: WATER SOLUBLE UPPER GI SERIES  TECHNIQUE: Single-column  upper GI series was performed using water soluble contrast.  CONTRAST:  150 mL Omnipaque 300  COMPARISON:  None.  FLUOROSCOPY TIME:  52 seconds  FINDINGS: A preprocedural KUB was performed demonstrating the nasogastric tube with the tip projecting over the stomach, but the proximal port is proximal to the esophagogastric junction. Recommend advancing the nasogastric tube 10 cm.  150 mL Omnipaque 300 was hand injected through an existing nasogastric tube. Following injection of contrast the stomach and duodenum was evaluated under realtime fluoroscopy. The patient was placed in the supine and right and left decubitus positions. Contrast is observed in the gastric antrum traversing the duodenum and into the proximal jejunum. There is no extravasation of contrast. Following the fluoroscopic evaluation a postprocedural KUB was performed demonstrating no extraluminal contrast.  There is reflux of contrast into the esophagus.  IMPRESSION: No extraluminal contrast identified to suggest a gastric or duodenal leak.  Nasogastric tube with the tip projecting over the stomach, but the proximal port is proximal to the esophagogastric junction. Recommend advancing the nasogastric tube 10 cm.   Electronically Signed   By: Elige Ko   On: 08/28/2013 09:09    CBC  Recent Labs Lab 08/26/13 0421 08/28/13 0500 08/30/13 0820 09/01/13 0550  WBC 15.6* 10.2  9.4 11.3*  HGB 9.3* 9.1* 9.3* 9.4*  HCT 27.4* 28.2* 29.3* 29.7*  PLT 328 285 252 268  MCV 87.0 89.5 91.0 92.2  MCH 29.5 28.9 28.9 29.2  MCHC 33.9 32.3 31.7 31.6  RDW 18.4* 18.9* 18.9* 19.0*    Chemistries   Recent Labs Lab 08/28/13 0858 08/29/13 0534 08/30/13 0820 08/31/13 1014 09/01/13 0550  NA 153* 147* 140 141 138  K 4.3 3.8 4.3 4.7 4.3  CL 125* 119* 116* 113* 111  CO2 18* 18* 13* 19 19  GLUCOSE 106* 96 92 110* 107*  BUN 58* 43* 31* 23 19  CREATININE 2.42* 1.96* 1.63* 1.63* 1.56*  CALCIUM 8.1* 7.4* 7.5* 7.7* 7.6*    ------------------------------------------------------------------------------------------------------------------ estimated creatinine clearance is 57.5 ml/min (by C-G formula based on Cr of 1.56). ------------------------------------------------------------------------------------------------------------------ No results found for this basename: HGBA1C,  in the last 72 hours ------------------------------------------------------------------------------------------------------------------ No results found for this basename: CHOL, HDL, LDLCALC, TRIG, CHOLHDL, LDLDIRECT,  in the last 72 hours ------------------------------------------------------------------------------------------------------------------ No results found for this basename: TSH, T4TOTAL, FREET3, T3FREE, THYROIDAB,  in the last 72 hours ------------------------------------------------------------------------------------------------------------------ No results found for this basename: VITAMINB12, FOLATE, FERRITIN, TIBC, IRON, RETICCTPCT,  in the last 72 hours  Coagulation profile No results found for this basename: INR, PROTIME,  in the last 168 hours  No results found for this basename: DDIMER,  in the last 72 hours  Cardiac Enzymes No results found for this basename: CK, CKMB, TROPONINI, MYOGLOBIN,  in the last 168 hours ------------------------------------------------------------------------------------------------------------------ No components found with this basename: POCBNP,

## 2013-09-01 NOTE — Progress Notes (Signed)
8 Days Post-Op  Subjective: Tolerated small amount of regular food.  No increased pain or nausea.  Still not much appetite  Objective: Vital signs in last 24 hours: Temp:  [98.2 F (36.8 C)-98.4 F (36.9 C)] 98.2 F (36.8 C) (12/07 0625) Pulse Rate:  [81-89] 84 (12/07 0625) Resp:  [18] 18 (12/07 0625) BP: (100-112)/(54-72) 106/67 mmHg (12/07 0625) SpO2:  [96 %-98 %] 96 % (12/07 0625) Last BM Date: 08/18/13  Intake/Output from previous day: 12/06 0701 - 12/07 0700 In: 1937.5 [P.O.:480; I.V.:1157.5; IV Piggyback:300] Out: 1710 [Urine:1675; Drains:35] Intake/Output this shift:    General appearance: alert, cooperative and no distress GI: soft, appropriate tenderness, ND, wound open, JP serous  Lab Results:   Recent Labs  08/30/13 0820 09/01/13 0550  WBC 9.4 11.3*  HGB 9.3* 9.4*  HCT 29.3* 29.7*  PLT 252 268   BMET  Recent Labs  08/31/13 1014 09/01/13 0550  NA 141 138  K 4.7 4.3  CL 113* 111  CO2 19 19  GLUCOSE 110* 107*  BUN 23 19  CREATININE 1.63* 1.56*  CALCIUM 7.7* 7.6*   PT/INR No results found for this basename: LABPROT, INR,  in the last 72 hours ABG No results found for this basename: PHART, PCO2, PO2, HCO3,  in the last 72 hours  Studies/Results: No results found.  Anti-infectives: Anti-infectives   Start     Dose/Rate Route Frequency Ordered Stop   08/29/13 1230  imipenem-cilastatin (PRIMAXIN) 500 mg in sodium chloride 0.9 % 100 mL IVPB     500 mg 200 mL/hr over 30 Minutes Intravenous Every 8 hours 08/29/13 0802     08/27/13 0200  imipenem-cilastatin (PRIMAXIN) 250 mg in sodium chloride 0.9 % 100 mL IVPB  Status:  Discontinued     250 mg 200 mL/hr over 30 Minutes Intravenous Every 6 hours 08/27/13 0051 08/29/13 0802   08/26/13 1400  vancomycin (VANCOCIN) 1,500 mg in sodium chloride 0.9 % 500 mL IVPB  Status:  Discontinued     1,500 mg 250 mL/hr over 120 Minutes Intravenous Every 48 hours 08/24/13 1301 08/24/13 2004   08/25/13 1800   imipenem-cilastatin (PRIMAXIN) 250 mg in sodium chloride 0.9 % 100 mL IVPB  Status:  Discontinued     250 mg 200 mL/hr over 30 Minutes Intravenous Every 12 hours 08/24/13 1245 08/27/13 0051   08/24/13 2345  fluconazole (DIFLUCAN) IVPB 200 mg     200 mg 100 mL/hr over 60 Minutes Intravenous Every 24 hours 08/24/13 2332     08/24/13 1315  vancomycin (VANCOCIN) 2,250 mg in sodium chloride 0.9 % 500 mL IVPB  Status:  Discontinued     2,250 mg 250 mL/hr over 120 Minutes Intravenous  Once 08/24/13 1301 08/24/13 2004   08/20/13 1200  imipenem-cilastatin (PRIMAXIN) 250 mg in sodium chloride 0.9 % 100 mL IVPB  Status:  Discontinued     250 mg 200 mL/hr over 30 Minutes Intravenous 4 times per day 08/20/13 1132 08/24/13 1245   08/18/13 2200  ceFEPIme (MAXIPIME) 1 g in dextrose 5 % 50 mL IVPB  Status:  Discontinued     1 g 100 mL/hr over 30 Minutes Intravenous Every 12 hours 08/18/13 1846 08/18/13 1847   08/18/13 2000  ceFEPIme (MAXIPIME) 1 g in dextrose 5 % 50 mL IVPB  Status:  Discontinued     1 g 100 mL/hr over 30 Minutes Intravenous Every 24 hours 08/18/13 1728 08/18/13 1846   08/18/13 1900  ceFEPIme (MAXIPIME) 1 g in dextrose 5 %  50 mL IVPB  Status:  Discontinued     1 g 100 mL/hr over 30 Minutes Intravenous Every 24 hours 08/18/13 1847 08/20/13 1132      Assessment/Plan: s/p Procedure(s) with comments: EXPLORATORY LAPAROTOMY (N/A) - Gram patch closure continue regular diet.  tolerating this okay.  JP can come out at anytime and should not need this to go home.  continue PPI's and wound care. Dispo planning for wound care.  Change to oral protonix  LOS: 14 days    Lodema Pilot DAVID 09/01/2013

## 2013-09-02 ENCOUNTER — Encounter (HOSPITAL_COMMUNITY): Payer: Self-pay | Admitting: Radiology

## 2013-09-02 LAB — CBC
HCT: 27.3 % — ABNORMAL LOW (ref 39.0–52.0)
Hemoglobin: 9 g/dL — ABNORMAL LOW (ref 13.0–17.0)
MCH: 29.8 pg (ref 26.0–34.0)
MCHC: 33 g/dL (ref 30.0–36.0)
MCV: 90.4 fL (ref 78.0–100.0)
Platelets: 256 10*3/uL (ref 150–400)
RBC: 3.02 MIL/uL — ABNORMAL LOW (ref 4.22–5.81)
RDW: 18.7 % — ABNORMAL HIGH (ref 11.5–15.5)

## 2013-09-02 LAB — BASIC METABOLIC PANEL
BUN: 17 mg/dL (ref 6–23)
CO2: 21 mEq/L (ref 19–32)
Calcium: 7.7 mg/dL — ABNORMAL LOW (ref 8.4–10.5)
Creatinine, Ser: 1.59 mg/dL — ABNORMAL HIGH (ref 0.50–1.35)
GFR calc Af Amer: 50 mL/min — ABNORMAL LOW (ref >90)
GFR calc non Af Amer: 43 mL/min — ABNORMAL LOW (ref >90)
Glucose, Bld: 102 mg/dL — ABNORMAL HIGH (ref 70–99)
Sodium: 137 mEq/L (ref 135–145)

## 2013-09-02 MED ORDER — ACETAMINOPHEN 325 MG PO TABS: 650.0000 mg | ORAL_TABLET | Freq: Four times a day (QID) | ORAL | Status: DC | PRN

## 2013-09-02 MED ORDER — TRAMADOL HCL 50 MG PO TABS
50.0000 mg | ORAL_TABLET | Freq: Four times a day (QID) | ORAL | Status: DC | PRN
  Administered 2013-09-02: 50 mg via ORAL
  Filled 2013-09-02: qty 1

## 2013-09-02 MED ORDER — SODIUM CHLORIDE 0.9 % IJ SOLN: 10.0000 mL | INTRAMUSCULAR | Status: DC | PRN

## 2013-09-02 MED ORDER — BISACODYL 10 MG RE SUPP
10.0000 mg | Freq: Once | RECTAL | Status: AC
  Administered 2013-09-02: 10 mg via RECTAL
  Filled 2013-09-02: qty 1

## 2013-09-02 MED ORDER — CIPROFLOXACIN IN D5W 400 MG/200ML IV SOLN
400.0000 mg | INTRAVENOUS | Status: AC
  Administered 2013-09-03: 400 mg via INTRAVENOUS
  Filled 2013-09-02: qty 200

## 2013-09-02 NOTE — Progress Notes (Signed)
ANTIBIOTIC CONSULT NOTE -Follow Up  Pharmacy Consult for Fluconazole, primaxin Indication: Intra-abdominal infection, bowel perf,ecoli sepsis   No Known Allergies  Patient Measurements: Height: 5\' 11"  (180.3 cm) Weight: 239 lb (108.41 kg) IBW/kg (Calculated) : 75.3  Vital Signs: Temp: 97.8 F (36.6 C) (12/08 0450) Temp src: Oral (12/08 0450) BP: 113/69 mmHg (12/08 0450) Pulse Rate: 85 (12/08 0450) Intake/Output from previous day: 12/07 0701 - 12/08 0700 In: 490 [P.O.:480] Out: 2025 [Urine:2000; Drains:25] Intake/Output from this shift: Total I/O In: 10 [Other:10] Out: -   Labs:  Recent Labs  08/31/13 1014 09/01/13 0550 09/02/13 0710  WBC  --  11.3* 9.4  HGB  --  9.4* 9.0*  PLT  --  268 256  CREATININE 1.63* 1.56* 1.59*   Estimated Creatinine Clearance: 56.4 ml/min (by C-G formula based on Cr of 1.59).   Microbiology: Recent Results (from the past 720 hour(s))  MRSA PCR SCREENING     Status: None   Collection Time    08/18/13  4:18 PM      Result Value Range Status   MRSA by PCR NEGATIVE  NEGATIVE Final   Comment:            The GeneXpert MRSA Assay (FDA     approved for NASAL specimens     only), is one component of a     comprehensive MRSA colonization     surveillance program. It is not     intended to diagnose MRSA     infection nor to guide or     monitor treatment for     MRSA infections.  URINE CULTURE     Status: None   Collection Time    08/18/13  5:13 PM      Result Value Range Status   Specimen Description URINE, CLEAN CATCH   Final   Special Requests NONE   Final   Culture  Setup Time     Final   Value: 08/19/2013 00:11     Performed at [facility] Count     Final   Value: 70,000 COLONIES/ML     Performed at [facility]   Culture     Final   Value: ESCHERICHIA COLI     Note: Confirmed Extended Spectrum Beta-Lactamase Producer (ESBL) CEFOXITIN SENSITIVE <=4     Performed at [facility]   Report  Status 08/21/2013 FINAL   Final   Organism ID, Bacteria ESCHERICHIA COLI   Final  CULTURE, ROUTINE-ABSCESS     Status: None   Collection Time    08/18/13  6:49 PM      Result Value Range Status   Specimen Description ABSCESS LEFT KIDNEY   Final   Special Requests NONE   Final   Gram Stain     Final   Value: ABUNDANT WBC PRESENT,BOTH PMN AND MONONUCLEAR     NO SQUAMOUS EPITHELIAL CELLS SEEN     MODERATE GRAM NEGATIVE RODS     Performed at [facility]   Culture     Final   Value: ABUNDANT ESCHERICHIA COLI     Note: Confirmed Extended Spectrum Beta-Lactamase Producer (ESBL) CRITICAL RESULT CALLED TO, READ BACK BY AND VERIFIED WITH: ALBERT R 08/21/13 AT 810 BY [facility]     Performed at [facility]   Report Status 08/21/2013 FINAL   Final   Organism ID, Bacteria ESCHERICHIA COLI   Final  ANAEROBIC CULTURE     Status: None  Collection Time    08/18/13  6:54 PM      Result Value Range Status   Specimen Description ABSCESS LEFT KIDNEY   Final   Special Requests NONE   Final   Gram Stain     Final   Value: ABUNDANT WBC PRESENT,BOTH PMN AND MONONUCLEAR     NO SQUAMOUS EPITHELIAL CELLS SEEN     MODERATE GRAM NEGATIVE RODS     Performed at [facility]   Culture     Final   Value: NO ANAEROBES ISOLATED     Performed at [facility]   Report Status 08/23/2013 FINAL   Final  CULTURE, BLOOD (ROUTINE X 2)     Status: None   Collection Time    08/18/13  7:05 PM      Result Value Range Status   Specimen Description BLOOD RIGHT HAND   Final   Special Requests BOTTLES DRAWN AEROBIC AND ANAEROBIC 10CC   Final   Culture  Setup Time     Final   Value: 08/19/2013 00:10     Performed at [facility]   Culture     Final   Value: NO GROWTH 5 DAYS     Performed at [facility]   Report Status 08/24/2013 FINAL   Final  CULTURE, BLOOD (ROUTINE X 2)     Status: None   Collection Time    08/18/13  9:45 PM      Result Value Range Status   Specimen  Description BLOOD LEFT HAND   Final   Special Requests BOTTLES DRAWN AEROBIC AND ANAEROBIC 10CC EA   Final   Culture  Setup Time     Final   Value: 08/19/2013 02:54     Performed at [facility]   Culture     Final   Value: NO GROWTH 5 DAYS     Performed at [facility]   Report Status 08/25/2013 FINAL   Final  CULTURE, BLOOD (ROUTINE X 2)     Status: None   Collection Time    08/20/13  4:40 PM      Result Value Range Status   Specimen Description BLOOD LEFT ARM   Final   Special Requests BOTTLES DRAWN AEROBIC ONLY 3CC   Final   Culture  Setup Time     Final   Value: 08/21/2013 02:13     Performed at [facility]   Culture     Final   Value: NO GROWTH 5 DAYS     Performed at [facility]   Report Status 08/27/2013 FINAL   Final  CULTURE, BLOOD (ROUTINE X 2)     Status: None   Collection Time    08/20/13  4:55 PM      Result Value Range Status   Specimen Description BLOOD RIGHT ARM   Final   Special Requests BOTTLES DRAWN AEROBIC ONLY 10CC   Final   Culture  Setup Time     Final   Value: 08/21/2013 02:13     Performed at [facility]   Culture     Final   Value: NO GROWTH 5 DAYS     Performed at [facility]   Report Status 08/27/2013 FINAL   Final  MRSA PCR SCREENING     Status: None   Collection Time    08/24/13  8:07 PM      Result Value Range Status   MRSA by PCR NEGATIVE  NEGATIVE Final   Comment:            The GeneXpert MRSA Assay (FDA     approved for NASAL specimens     only), is one component of a     comprehensive MRSA colonization     surveillance program. It is not     intended to diagnose MRSA     infection nor to guide or     monitor treatment for     MRSA infections.  BODY FLUID CULTURE     Status: None   Collection Time    08/25/13  3:36 PM      Result Value Range Status   Specimen Description PERITONEAL CAVITY   Final   Special Requests Normal   Final   Gram Stain     Final   Value: ABUNDANT WBC  PRESENT,BOTH PMN AND MONONUCLEAR     NO ORGANISMS SEEN     Performed at [facility]   Culture     Final   Value: NO GROWTH 3 DAYS     Performed at [facility]   Report Status 08/29/2013 FINAL   Final    Medical History: Past Medical History  Diagnosis Date  . Arthritis   . Degenerative disc disease   . Degenerative joint disease   . Aortic aneurysm   . Pneumonia   . Hypercholesterolemia   . Hypertension   . CKD (chronic kidney disease), stage III    Assessment: 31 YOM who continues on broad spectrum antibiotics.  Renal function has been improving- stable over the past few days. SCr 1.59 and CrCl ~94mL/min this morning. To have ureteral stent placement tomorrow. Antibiotics need to continue until there is resolution of renal abscess- will need a repeat CT to confirm this. Per ID, plan is for antibiotics to be d/c'd 12/14.  Goal of Therapy:  Clinical resolution   Plan:  -Fluconazole 200 mg IV q24h -Primaxin 500 mg IV q8 hours -f/u stent placement, renal function, clinical progression, LOT  Sarah-Jane Nazario D. Benney [place], PharmD, BCPS Clinical Pharmacist Pager: [phone] 09/02/2013 11:37 AM

## 2013-09-02 NOTE — Progress Notes (Signed)
9 Days Post-Op  Subjective: Pt states he has not had a BM yet, passing flatus, tolerating diet.    Objective: Vital signs in last 24 hours: Temp:  [97.8 F (36.6 C)-98.3 F (36.8 C)] 97.8 F (36.6 C) (12/08 0450) Pulse Rate:  [85-87] 85 (12/08 0450) Resp:  [18] 18 (12/08 0450) BP: (102-119)/(57-70) 113/69 mmHg (12/08 0450) SpO2:  [96 %-97 %] 96 % (12/08 0450) Last BM Date: 08/30/13  Intake/Output from previous day: 12/07 0701 - 12/08 0700 In: 490 [P.O.:480] Out: 2025 [Urine:2000; Drains:25] Intake/Output this shift:   PE:  Gen: Alert, NAD, pleasant  Abd: Soft, non distended, incision is TTP, bowel sounds are present, no HSM,  RLQ drain with serous output(45ml/24h)  Lab Results:   Recent Labs  09/01/13 0550 09/02/13 0710  WBC 11.3* 9.4  HGB 9.4* 9.0*  HCT 29.7* 27.3*  PLT 268 256   BMET  Recent Labs  09/01/13 0550 09/02/13 0710  NA 138 137  K 4.3 4.1  CL 111 110  CO2 19 21  GLUCOSE 107* 102*  BUN 19 17  CREATININE 1.56* 1.59*  CALCIUM 7.6* 7.7*   PT/INR No results found for this basename: LABPROT, INR,  in the last 72 hours ABG No results found for this basename: PHART, PCO2, PO2, HCO3,  in the last 72 hours  Studies/Results: Ct Abdomen Pelvis Wo Contrast  09/01/2013   CLINICAL DATA:  Kidney infection.  Perforated duodenal ulcer.  EXAM: CT ABDOMEN AND PELVIS WITHOUT CONTRAST  TECHNIQUE: Multidetector CT imaging of the abdomen and pelvis was performed following the standard protocol without intravenous contrast.  COMPARISON:  CT scan dated 08/24/2013  FINDINGS: There is a persistent small right pleural effusion with persistent atelectasis of much of the right lower lobe. Atelectasis at the left base has almost completely resolved.  The patient has undergone repair of a perforated duodenal ulcer. A drain is in place at that site. There are multiple small air bubbles in the soft tissues just above the duodenal bulb a but there is no pus collection.  There are  few small stones in the neck of the gallbladder. No bile duct dilatation.  Ascites has almost resolved. There is residual fluid loculated along the inferior medial aspect of the right lobe of the liver.  Numerous calcified granulomas in the otherwise normal-appearing spleen. Pancreas and adrenal glands are normal. Pigtail catheter is seen in the left renal pelvis. There is no hydronephrosis. There is a 15 mm stone in the proximal left ureter, unchanged.  There are multiple low-density lesions in both kidneys, unchanged since multiple prior studies.  There are numerous diverticula in the nondistended colon. Aorto bi-iliac stent graft is in place. There is a large amount of stool and barium in the rectum.  IMPRESSION: 1. There is small amount of residual in ascites loculated around the liver but most of the ascites has resolved. 2. There is no abscess around the site of the previous duodenal perforation. There are multiple small air bubbles just above the duodenum bulb. 3. Percutaneous stent remains in the proximal left renal collecting system and the 15 mm stone remains in the proximal left ureter. No residual left hydronephrosis.   Electronically Signed   By: Geanie Cooley M.D.   On: 09/01/2013 15:38    Anti-infectives: Anti-infectives   Start     Dose/Rate Route Frequency Ordered Stop   08/29/13 1230  imipenem-cilastatin (PRIMAXIN) 500 mg in sodium chloride 0.9 % 100 mL IVPB     500  mg 200 mL/hr over 30 Minutes Intravenous Every 8 hours 08/29/13 0802     08/27/13 0200  imipenem-cilastatin (PRIMAXIN) 250 mg in sodium chloride 0.9 % 100 mL IVPB  Status:  Discontinued     250 mg 200 mL/hr over 30 Minutes Intravenous Every 6 hours 08/27/13 0051 08/29/13 0802   08/26/13 1400  vancomycin (VANCOCIN) 1,500 mg in sodium chloride 0.9 % 500 mL IVPB  Status:  Discontinued     1,500 mg 250 mL/hr over 120 Minutes Intravenous Every 48 hours 08/24/13 1301 08/24/13 2004   08/25/13 1800  imipenem-cilastatin (PRIMAXIN)  250 mg in sodium chloride 0.9 % 100 mL IVPB  Status:  Discontinued     250 mg 200 mL/hr over 30 Minutes Intravenous Every 12 hours 08/24/13 1245 08/27/13 0051   08/24/13 2345  fluconazole (DIFLUCAN) IVPB 200 mg     200 mg 100 mL/hr over 60 Minutes Intravenous Every 24 hours 08/24/13 2332     08/24/13 1315  vancomycin (VANCOCIN) 2,250 mg in sodium chloride 0.9 % 500 mL IVPB  Status:  Discontinued     2,250 mg 250 mL/hr over 120 Minutes Intravenous  Once 08/24/13 1301 08/24/13 2004   08/20/13 1200  imipenem-cilastatin (PRIMAXIN) 250 mg in sodium chloride 0.9 % 100 mL IVPB  Status:  Discontinued     250 mg 200 mL/hr over 30 Minutes Intravenous 4 times per day 08/20/13 1132 08/24/13 1245   08/18/13 2200  ceFEPIme (MAXIPIME) 1 g in dextrose 5 % 50 mL IVPB  Status:  Discontinued     1 g 100 mL/hr over 30 Minutes Intravenous Every 12 hours 08/18/13 1846 08/18/13 1847   08/18/13 2000  ceFEPIme (MAXIPIME) 1 g in dextrose 5 % 50 mL IVPB  Status:  Discontinued     1 g 100 mL/hr over 30 Minutes Intravenous Every 24 hours 08/18/13 1728 08/18/13 1846   08/18/13 1900  ceFEPIme (MAXIPIME) 1 g in dextrose 5 % 50 mL IVPB  Status:  Discontinued     1 g 100 mL/hr over 30 Minutes Intravenous Every 24 hours 08/18/13 1847 08/20/13 1132      Assessment/Plan: Perforated duodenal ulcerwith free air - POD #9 s/p laparotomy and graham patch closure of perforated duodenal ulcer and placement of JP drains  Hydronephrosis (left)/renal abscess s/p perc nephro tube  Bacteremia (ESBL ecoli) and severe sepsis for UTI  ARF on chronic renal failure  PVD  HTN  Leukocytosis  ABL Anemia   Plan:  -JP drain removed -Continue BID wet to dry dressing changes -Add oral pain meds -Ambulate -IS -Antibiotic therapy per ID, recommends 21 day therapy for renal abscess -Continue PPI -Give dulcolax x1 now   LOS: 15 days    Wiatt Mahabir ANP-BC 09/02/2013 9:31 AM

## 2013-09-02 NOTE — Consult Note (Signed)
HPI: Nathan Rivera is an 67 y.o. male with recent perf duodenal ulcer requiring ex lap with graham patch. He is recovering well from that. He also developed hydronephrosis and ARF from an obstructing left renal stone. He underwent (L)PCN on 11/24 and has good output. Urology has now recommended attempting internalizing (L)PCN to stent. Chart, PMHx, and meds reviewed.  Past Medical History:  Past Medical History  Diagnosis Date  . Arthritis   . Degenerative disc disease   . Degenerative joint disease   . Aortic aneurysm   . Pneumonia   . Hypercholesterolemia   . Hypertension   . CKD (chronic kidney disease), stage III     Past Surgical History:  Past Surgical History  Procedure Laterality Date  . Abdominal aortic aneurysm repair  2011  . Hernia repair      54mo old  . Tonsillectomy    . Umbilical hernia repair      50 yr ago  . Laparotomy N/A 08/24/2013    Procedure: EXPLORATORY LAPAROTOMY;  Surgeon: Robyne Askew, MD;  Location: [facility] OR;  Service: General;  Laterality: N/A;  Gram patch closure    Family History:  Family History  Problem Relation Age of Onset  . CVA Father   . Atrial fibrillation Sister     Social History:  reports that he has been smoking Cigarettes.  He has a 40 pack-year smoking history. He does not have any smokeless tobacco history on file. He reports that he drinks about 1.8 ounces of alcohol per week. He reports that he does not use illicit drugs.  Allergies: No Known Allergies  Medications:   Medication List    ASK your doctor about these medications       amLODipine 5 MG tablet  Commonly known as:  NORVASC  Take 5 mg by mouth daily.     cholecalciferol 1000 UNITS tablet  Commonly known as:  VITAMIN D  Take 1,000 Units by mouth daily.     etodolac 500 MG tablet  Commonly known as:  LODINE  Take 500 mg by mouth 2 (two) times daily.     losartan 100 MG tablet  Commonly known as:  COZAAR  Take 100 mg by mouth daily.     simvastatin  20 MG tablet  Commonly known as:  ZOCOR  Take 20 mg by mouth at bedtime.        Please HPI for pertinent positives, otherwise complete 10 system ROS negative.  Physical Exam: BP 113/69  Pulse 85  Temp(Src) 97.8 F (36.6 C) (Oral)  Resp 18  Ht 5\' 11"  (1.803 m)  Wt 239 lb (108.41 kg)  BMI 33.35 kg/m2  SpO2 96% Body mass index is 33.35 kg/(m^2).   General Appearance:  Alert, cooperative, no distress, appears stated age  Head:  Normocephalic, without obvious abnormality, atraumatic  ENT: Unremarkable  Neck: Supple, symmetrical, trachea midline  Lungs:   Clear to auscultation bilaterally, no w/r/r,  Chest Wall:  No tenderness or deformity  Heart:  Regular rate and rhythm, S1, S2 normal, no murmur, rub or gallop.  Abdomen:   Soft, non-tender, non distended. (L)PCN intact, site clean. Good clear UOP  Neurologic: Normal affect, no gross deficits.   Results for orders placed during the hospital encounter of 08/18/13 (from the past 48 hour(s))  CBC     Status: Abnormal   Collection Time    09/01/13  5:50 AM      Result Value Range   WBC 11.3 (*)  4.0 - 10.5 K/uL   RBC 3.22 (*) 4.22 - 5.81 MIL/uL   Hemoglobin 9.4 (*) 13.0 - 17.0 g/dL   HCT 19.1 (*) 47.8 - 29.5 %   MCV 92.2  78.0 - 100.0 fL   MCH 29.2  26.0 - 34.0 pg   MCHC 31.6  30.0 - 36.0 g/dL   RDW 62.1 (*) 30.8 - 65.7 %   Platelets 268  150 - 400 K/uL  BASIC METABOLIC PANEL     Status: Abnormal   Collection Time    09/01/13  5:50 AM      Result Value Range   Sodium 138  135 - 145 mEq/L   Potassium 4.3  3.5 - 5.1 mEq/L   Chloride 111  96 - 112 mEq/L   CO2 19  19 - 32 mEq/L   Glucose, Bld 107 (*) 70 - 99 mg/dL   BUN 19  6 - 23 mg/dL   Creatinine, Ser 8.46 (*) 0.50 - 1.35 mg/dL   Calcium 7.6 (*) 8.4 - 10.5 mg/dL   GFR calc non Af Amer 44 (*) >90 mL/min   GFR calc Af Amer 51 (*) >90 mL/min   Comment: (NOTE)     The eGFR has been calculated using the CKD EPI equation.     This calculation has not been validated in all  clinical situations.     eGFR's persistently <90 mL/min signify possible Chronic Kidney     Disease.  CBC     Status: Abnormal   Collection Time    09/02/13  7:10 AM      Result Value Range   WBC 9.4  4.0 - 10.5 K/uL   RBC 3.02 (*) 4.22 - 5.81 MIL/uL   Hemoglobin 9.0 (*) 13.0 - 17.0 g/dL   HCT 96.2 (*) 95.2 - 84.1 %   MCV 90.4  78.0 - 100.0 fL   MCH 29.8  26.0 - 34.0 pg   MCHC 33.0  30.0 - 36.0 g/dL   RDW 32.4 (*) 40.1 - 02.7 %   Platelets 256  150 - 400 K/uL  BASIC METABOLIC PANEL     Status: Abnormal   Collection Time    09/02/13  7:10 AM      Result Value Range   Sodium 137  135 - 145 mEq/L   Potassium 4.1  3.5 - 5.1 mEq/L   Chloride 110  96 - 112 mEq/L   CO2 21  19 - 32 mEq/L   Glucose, Bld 102 (*) 70 - 99 mg/dL   BUN 17  6 - 23 mg/dL   Creatinine, Ser 2.53 (*) 0.50 - 1.35 mg/dL   Calcium 7.7 (*) 8.4 - 10.5 mg/dL   GFR calc non Af Amer 43 (*) >90 mL/min   GFR calc Af Amer 50 (*) >90 mL/min   Comment: (NOTE)     The eGFR has been calculated using the CKD EPI equation.     This calculation has not been validated in all clinical situations.     eGFR's persistently <90 mL/min signify possible Chronic Kidney     Disease.   Ct Abdomen Pelvis Wo Contrast  09/01/2013   CLINICAL DATA:  Kidney infection.  Perforated duodenal ulcer.  EXAM: CT ABDOMEN AND PELVIS WITHOUT CONTRAST  TECHNIQUE: Multidetector CT imaging of the abdomen and pelvis was performed following the standard protocol without intravenous contrast.  COMPARISON:  CT scan dated 08/24/2013  FINDINGS: There is a persistent small right pleural effusion with persistent atelectasis of much  of the right lower lobe. Atelectasis at the left base has almost completely resolved.  The patient has undergone repair of a perforated duodenal ulcer. A drain is in place at that site. There are multiple small air bubbles in the soft tissues just above the duodenal bulb a but there is no pus collection.  There are few small stones in the  neck of the gallbladder. No bile duct dilatation.  Ascites has almost resolved. There is residual fluid loculated along the inferior medial aspect of the right lobe of the liver.  Numerous calcified granulomas in the otherwise normal-appearing spleen. Pancreas and adrenal glands are normal. Pigtail catheter is seen in the left renal pelvis. There is no hydronephrosis. There is a 15 mm stone in the proximal left ureter, unchanged.  There are multiple low-density lesions in both kidneys, unchanged since multiple prior studies.  There are numerous diverticula in the nondistended colon. Aorto bi-iliac stent graft is in place. There is a large amount of stool and barium in the rectum.  IMPRESSION: 1. There is small amount of residual in ascites loculated around the liver but most of the ascites has resolved. 2. There is no abscess around the site of the previous duodenal perforation. There are multiple small air bubbles just above the duodenum bulb. 3. Percutaneous stent remains in the proximal left renal collecting system and the 15 mm stone remains in the proximal left ureter. No residual left hydronephrosis.   Electronically Signed   By: Geanie Cooley M.D.   On: 09/01/2013 15:38    Assessment/Plan (L)hydronephrosis from renal stone. S/p (L)PCN 11/24 Discussed plan for attempt at (L)ureteral stent tomorrow. NPO p MN Explained procedure, risks, complications, use of sedation. Labs reviewed. Pt understands (L)PCN may be left/capped as safety valve if concern for stent failure. Consent signed in chart  Brayton El PA-C 09/02/2013, 10:58 AM

## 2013-09-02 NOTE — Progress Notes (Signed)
General Surgery [facility]  Patient seen and examined.  Wife at bedside.  Discussed clinical course and plans for discharge.  IR plans stent placement tomorrow.  Will tentatively begin plans for discharge on Wednesday 12/10 if patient continues to progress.  Case Manager to see and assist with HHN, Home PT, and DME needs.  Velora Heckler, MD, [facility] Office: [phone]

## 2013-09-02 NOTE — Progress Notes (Signed)
Large amount of serous fluid noted on bedpad when flushing nephrostomy tube.  No overt signs of leaking noted from JP drain dressing or nephrostomy. Serous fluid on bedpad consistent with drainage noted in JP drain.  Pt. JP drain noted to need to be charged q1hour.  Dr. Luisa Hart notified.  Will continue to monitor.

## 2013-09-02 NOTE — Progress Notes (Signed)
Peripherally Inserted Central Catheter/Midline Placement  The IV Nurse has discussed with the patient and/or persons authorized to consent for the patient, the purpose of this procedure and the potential benefits and risks involved with this procedure.  The benefits include less needle sticks, lab draws from the catheter and patient may be discharged home with the catheter.  Risks include, but not limited to, infection, bleeding, blood clot (thrombus formation), and puncture of an artery; nerve damage and irregular heat beat.  Alternatives to this procedure were also discussed.  PICC/Midline Placement Documentation        Nathan Rivera 09/02/2013, 3:34 PM

## 2013-09-02 NOTE — Progress Notes (Signed)
[facility]                                                                                Patient Demographics  Nathan Rivera, is a 67 y.o. male, DOB - January 13, 1946, ZOX:[id number]  Admit date - 08/18/2013   Admitting Physician Costin Otelia Sergeant, MD  Outpatient Primary MD for the patient is [facility], Madaline Guthrie, MD  LOS - 15   No chief complaint on file.       Assessment & Plan  Principal Problem:   Severe sepsis Active Problems:   PVD (peripheral vascular disease)   Hypertension   Acute renal failure   Hydronephrosis, left   Renal abscess   Acute respiratory failure with hypoxia   Dehydration   Bacteremia due to ESBL Escherichia coli  Perforated duodenal ulcer  -x ray on 11/27 showed mild ileus- but CT abd/pelvis 11/29 revealed free air  - underwent EL/graham patch closure of perf DU  -Patient currently on regular diet, tolerating small amounts well. -continue antibiotics  Severe sepsis  -resolved, leukocytosis trending downward-9.3 -likely secondary to urinary tract involvement vs perf duodenal ulcer -Infectious disease following and recommends 10 days total of antibiotics (Imipenem and Diflucan),as well as 21 days of anbx from initial placement his renal drain on 11/23.  End antibiotics on 09/08/2013 -Will defer to surgery re: length of antibiotics post perforated viscus repair.   Hypernatremia/ Dehydration  -Resolved. Na 137  Hydronephrosis, left/Renal abscess/1 cm ureteral calculus  -post percutaneous nephrostomy tube  -Spoke with urology, Dr. Vernie Ammons, who recommended internalizing with stent placement done by interventional radiology -Repeat CT scan shows 15mm stone left proximal ureter -Consulting IR for internalization of nephrostomy tube  Bacteremia due to ESBL Escherichia coli  -initially treated with Cefepime  -final culture called 11/25 from [place] with E. Coli positive ESBL  -ID recommend change to Imipenem -Repeat blood cultures have remained  negative  Acute renal failure  -Creatinine trending downward 1.59, will continue to monitor -Likely secondary to obstructive uropathy due to renal stone with hydronephrosis and infection  Acute respiratory failure with hypoxia  -due to sepsis, ARF and volume overload from prior resuscitative efforts  -stable on room air  PVD (peripheral vascular disease)  -Stable  Hypertension  -hold home meds for now  Anemia -Likely secondary to surgery -H/H 9.0/27.3 currently stable.  Will continue to monitor. -No signs of active bleeding.   Code Status: Full  Family Communication: Wife at bedside.  Disposition Plan: Patient currently admitted. Will likely need home health on discharge.  Currently tolerating diet.  Will consult IR for nephrostomy tube.  Likely discharge 12/9  Procedures  -Status post left percutaneous nephrostomy -Exploratory laparotomy with patch closure/omental patch perforated duodenal ulcer with primary repair  Consults   [facility] ID IR [facility], Dr. Vernie Ammons, via phone  DVT Prophylaxis  Lovenox   Lab Results  Component Value Date   PLT 256 09/02/2013    Medications  Scheduled Meds: . antiseptic oral rinse  15 mL Mouth Rinse QID  . bisacodyl  10 mg Rectal Once  . enoxaparin (LOVENOX) injection  40 mg Subcutaneous Daily  . fluconazole (DIFLUCAN) IV  200 mg Intravenous Q24H  .  imipenem-cilastatin  500 mg Intravenous Q8H  . pantoprazole  40 mg Oral Q12H   Continuous Infusions: . dextrose 5 % and 0.45 % NaCl with KCl 20 mEq/L 75 mL/hr at 09/02/13 0435   PRN Meds:.acetaminophen, albuterol, fentaNYL, ondansetron (ZOFRAN) IV, ondansetron, sodium chloride, sodium chloride, traMADol  Antibiotics    Anti-infectives   Start     Dose/Rate Route Frequency Ordered Stop   08/29/13 1230  imipenem-cilastatin (PRIMAXIN) 500 mg in sodium chloride 0.9 % 100 mL IVPB     500 mg 200 mL/hr over 30 Minutes Intravenous Every 8 hours 08/29/13 0802     08/27/13 0200   imipenem-cilastatin (PRIMAXIN) 250 mg in sodium chloride 0.9 % 100 mL IVPB  Status:  Discontinued     250 mg 200 mL/hr over 30 Minutes Intravenous Every 6 hours 08/27/13 0051 08/29/13 0802   08/26/13 1400  vancomycin (VANCOCIN) 1,500 mg in sodium chloride 0.9 % 500 mL IVPB  Status:  Discontinued     1,500 mg 250 mL/hr over 120 Minutes Intravenous Every 48 hours 08/24/13 1301 08/24/13 2004   08/25/13 1800  imipenem-cilastatin (PRIMAXIN) 250 mg in sodium chloride 0.9 % 100 mL IVPB  Status:  Discontinued     250 mg 200 mL/hr over 30 Minutes Intravenous Every 12 hours 08/24/13 1245 08/27/13 0051   08/24/13 2345  fluconazole (DIFLUCAN) IVPB 200 mg     200 mg 100 mL/hr over 60 Minutes Intravenous Every 24 hours 08/24/13 2332     08/24/13 1315  vancomycin (VANCOCIN) 2,250 mg in sodium chloride 0.9 % 500 mL IVPB  Status:  Discontinued     2,250 mg 250 mL/hr over 120 Minutes Intravenous  Once 08/24/13 1301 08/24/13 2004   08/20/13 1200  imipenem-cilastatin (PRIMAXIN) 250 mg in sodium chloride 0.9 % 100 mL IVPB  Status:  Discontinued     250 mg 200 mL/hr over 30 Minutes Intravenous 4 times per day 08/20/13 1132 08/24/13 1245   08/18/13 2200  ceFEPIme (MAXIPIME) 1 g in dextrose 5 % 50 mL IVPB  Status:  Discontinued     1 g 100 mL/hr over 30 Minutes Intravenous Every 12 hours 08/18/13 1846 08/18/13 1847   08/18/13 2000  ceFEPIme (MAXIPIME) 1 g in dextrose 5 % 50 mL IVPB  Status:  Discontinued     1 g 100 mL/hr over 30 Minutes Intravenous Every 24 hours 08/18/13 1728 08/18/13 1846   08/18/13 1900  ceFEPIme (MAXIPIME) 1 g in dextrose 5 % 50 mL IVPB  Status:  Discontinued     1 g 100 mL/hr over 30 Minutes Intravenous Every 24 hours 08/18/13 1847 08/20/13 1132       Time Spent in minutes   25 minutes   Yaret Hush D.O. on 09/02/2013 at 10:28 AM  Between 7am to 7pm - Pager - [phone]  After 7pm go to [email or web address] - password [email or web address]  And look for the night coverage person covering for me  after hours  [facility] Office  [phone]    Subjective:   Haylen Shelnutt seen and examined today.   Patient has no complaints at this time.  States he was able to tolerate small amounts of food.  Wishes to go home. Patient denies dizziness, chest pain, shortness of breath, abdominal pain, N/V/D/C, new weakness, numbess, tingling.    Objective:   Filed Vitals:   09/01/13 0625 09/01/13 1353 09/01/13 2212 09/02/13 0450  BP: 106/67 102/57 119/70 113/69  Pulse: 84  87 85  Temp: 98.2 F (  36.8 C) 98 F (36.7 C) 98.3 F (36.8 C) 97.8 F (36.6 C)  TempSrc: Oral Oral Oral Oral  Resp: 18 18 18 18   Height:      Weight:      SpO2: 96% 97% 96% 96%    Wt Readings from Last 3 Encounters:  08/24/13 108.41 kg (239 lb)  08/24/13 108.41 kg (239 lb)  08/29/11 115.44 kg (254 lb 8 oz)     Intake/Output Summary (Last 24 hours) at 09/02/13 1028 Last data filed at 09/02/13 0450  Gross per 24 hour  Intake    490 ml  Output   2025 ml  Net  -1535 ml    Exam  General: Well developed, well nourished, NAD, appears stated age  HEENT: NCAT, PERRLA, EOMI, Anicteic Sclera, mucous membranes moist. No pharyngeal erythema or exudates  Neck: Supple, no JVD, no masses  Cardiovascular: S1 S2 auscultated, no rubs, murmurs or gallops. Regular rate and rhythm.  Respiratory: Clear to auscultation bilaterally with equal chest rise  Abdomen: Soft, nontender, nondistended, + bowel sounds, right drain in place  Extremities: warm dry without cyanosis clubbing or edema  Neuro: AAOx3, cranial nerves grossly intact.   Skin: Without rashes exudates or nodules  Psych: Normal affect and demeanor with intact judgement and insight  Data Review   Micro Results Recent Results (from the past 240 hour(s))  MRSA PCR SCREENING     Status: None   Collection Time    08/24/13  8:07 PM      Result Value Range Status   MRSA by PCR NEGATIVE  NEGATIVE Final   Comment:            The GeneXpert MRSA  Assay (FDA     approved for NASAL specimens     only), is one component of a     comprehensive MRSA colonization     surveillance program. It is not     intended to diagnose MRSA     infection nor to guide or     monitor treatment for     MRSA infections.  BODY FLUID CULTURE     Status: None   Collection Time    08/25/13  3:36 PM      Result Value Range Status   Specimen Description PERITONEAL CAVITY   Final   Special Requests Normal   Final   Gram Stain     Final   Value: ABUNDANT WBC PRESENT,BOTH PMN AND MONONUCLEAR     NO ORGANISMS SEEN     Performed at [facility]   Culture     Final   Value: NO GROWTH 3 DAYS     Performed at [facility]   Report Status 08/29/2013 FINAL   Final    Radiology Reports Ct Abdomen Pelvis Wo Contrast  08/24/2013   CLINICAL DATA:  Abdominal pain, sepsis, leukocytosis. "Coffee-ground" fluid draining from NG tube.  EXAM: CT ABDOMEN AND PELVIS WITHOUT CONTRAST  TECHNIQUE: Multidetector CT imaging of the abdomen and pelvis was performed following the standard protocol without intravenous contrast.  COMPARISON:  [facility] CT abdomen pelvis dated 08/18/2013  FINDINGS: Motion degraded images.  Small right pleural effusion. Associated lower lobe atelectasis bilaterally.  Unenhanced liver, pancreas, and adrenal glands are within normal limits.  Numerous splenic granulomata.  Interval placement of a left percutaneous nephrostomy catheter with decompression of the prior left hydronephrosis. Bilateral renal cysts, poorly evaluated in the absence of intravenous contrast. Stable 10 mm proximal left ureteral calculus (series  2/ image 64).  Atherosclerotic calcifications of the abdominal aorta and branch vessels. Aorto bi-iliac stent graft.  Moderate abdominopelvic ascites, new.  Interval development of moderate free intraperitoneal gas. This would not be accounted for by the nephrostomy placement, which was retroperitoneal. In the absence of interval  intraperitoneal procedure, this is worrisome for a perforated hollow viscus. Given the presence of inflammatory stranding (series 2/image 36) and localized gas near the gastric antrum (series 2/image 32), a perforated gastric ulcer is considered.  No suspicious abdominopelvic lymphadenopathy.  Prostate is unremarkable.  Mildly thick-walled bladder (Series 2/ image 99).  Mild degenerative changes of the visualized thoracolumbar spine.  IMPRESSION: Interval development of moderate free intraperitoneal gas, suggesting a perforated hollow viscus. Although the exact etiology is unclear, a perforated gastric ulcer is considered.  Moderate abdominopelvic ascites, new.  Interval placement of a left percutaneous nephrostomy catheter with decompression of prior left hydronephrosis. Stable 10 mm proximal left ureteral calculus.  Mildly thick-walled bladder, correlate for cystitis.  Critical value/emergent results were called by telephone at the time of interpretation on 08/24/2013 at 12:40 PM to Dr.JESSICA [facility] [place]aks , who verbally acknowledged these results.   Electronically Signed   By: Charline Bills M.D.   On: 08/24/2013 12:44   Dg Chest 1 View  08/24/2013   CLINICAL DATA:  Decreasing oxygen saturation, abdominal pain  EXAM: CHEST - 1 VIEW  COMPARISON:  08/18/2013; 08/17/2013; CT abdomen pelvis -08/24/2013  FINDINGS: Grossly unchanged borderline enlarged cardiac silhouette and mediastinal contours. Interval placement of enteric tube tip and side port projecting below the left hemidiaphragm. Interval removal of left jugular approach central venous catheter. Overall improved aeration of the lungs. There is persistent mild elevation of the right hemidiaphragm and minimal bibasilar opacities, right greater than left. No new focal airspace opacities. Trace right-sided effusion is not excluded. No pneumothorax. Unchanged bones.  IMPRESSION: 1. Overall improved aeration of the lungs suggests resolving edema and/or atelectasis.  2. Persistent mild elevation of the right hemidiaphragm with bibasilar opacities, right greater than left, likely atelectasis. No new focal airspace opacities. 3. Enteric tube tip and side port projects below the left hemidiaphragm.   Electronically Signed   By: Simonne Come M.D.   On: 08/24/2013 12:50   Ir Perc Nephrostomy Left  08/19/2013   CLINICAL DATA:  Septic shock, gram negative bacteremia and left hydronephrosis secondary to an obstructing ureteral calculus.  EXAM: 1. ULTRASOUND GUIDANCE FOR PUNCTURE OF THE LEFT RENAL COLLECTING SYSTEM. 2. LEFT PERCUTANEOUS NEPHROSTOMY TUBE PLACEMENT.  MEDICATIONS: No additional medications were administered.  ANESTHESIA/SEDATION: 1.0 mg IV Versed; 25 mcg IV Fentanyl.  Total Moderate Sedation Time  ONE 3 minutes  CONTRAST:  10 ml Omnipaque 300  COMPARISON:  None.  FLUOROSCOPY TIME:  30 seconds.  PROCEDURE: The procedure, risks, benefits, and alternatives were explained to the patient. Questions regarding the procedure were encouraged and answered. The patient understands and consents to the procedure.  The left flank region was prepped with Betadine in a sterile fashion, and a sterile drape was applied covering the operative field. A sterile gown and sterile gloves were used for the procedure. Local anesthesia was provided with 1% Lidocaine.  Ultrasound was used to localize the left kidney. Under direct ultrasound guidance, a 21 gauge needle was advanced into the renal collecting system. Ultrasound image documentation was performed. Aspiration of urine sample was performed followed by contrast injection. A sample of aspirated urine was sent for culture analysis.  A transitional dilator was advanced over a guidewire.  Percutaneous tract dilatation was then performed over the guidewire. A 10 -French percutaneous nephrostomy tube was then advanced and formed in the collecting system. Catheter position was confirmed by fluoroscopy after contrast injection.  The catheter was  secured at the skin with a Prolene retention suture and Stat-Lock device. A gravity bag was placed.  COMPLICATIONS: None.  FINDINGS: Ultrasound confirms significant left hydronephrosis. After needle puncture, there was immediate return of overtly purulent fluid consistent with pyonephrosis. After placement of the 10 French nephrostomy tubes, there is rapid drainage of purulent fluid.  IMPRESSION: Left percutaneous nephrostomy tube placement. There was evidence of overt pyonephrosis. A fluid sample was sent for culture analysis. A 10 French nephrostomy tube was placed, formed in the renal pelvis and connected to gravity bag drainage.   Electronically Signed   By: Irish Lack M.D.   On: 08/19/2013 19:58   Ir US Guide Bx Asp/drain  08/19/2013   CLINICAL DATA:  Septic shock, gram negative bacteremia and left hydronephrosis secondary to an obstructing ureteral calculus.  EXAM: 1. ULTRASOUND GUIDANCE FOR PUNCTURE OF THE LEFT RENAL COLLECTING SYSTEM. 2. LEFT PERCUTANEOUS NEPHROSTOMY TUBE PLACEMENT.  MEDICATIONS: No additional medications were administered.  ANESTHESIA/SEDATION: 1.0 mg IV Versed; 25 mcg IV Fentanyl.  Total Moderate Sedation Time  ONE 3 minutes  CONTRAST:  10 ml Omnipaque 300  COMPARISON:  None.  FLUOROSCOPY TIME:  30 seconds.  PROCEDURE: The procedure, risks, benefits, and alternatives were explained to the patient. Questions regarding the procedure were encouraged and answered. The patient understands and consents to the procedure.  The left flank region was prepped with Betadine in a sterile fashion, and a sterile drape was applied covering the operative field. A sterile gown and sterile gloves were used for the procedure. Local anesthesia was provided with 1% Lidocaine.  Ultrasound was used to localize the left kidney. Under direct ultrasound guidance, a 21 gauge needle was advanced into the renal collecting system. Ultrasound image documentation was performed. Aspiration of urine sample was  performed followed by contrast injection. A sample of aspirated urine was sent for culture analysis.  A transitional dilator was advanced over a guidewire. Percutaneous tract dilatation was then performed over the guidewire. A 10 -French percutaneous nephrostomy tube was then advanced and formed in the collecting system. Catheter position was confirmed by fluoroscopy after contrast injection.  The catheter was secured at the skin with a Prolene retention suture and Stat-Lock device. A gravity bag was placed.  COMPLICATIONS: None.  FINDINGS: Ultrasound confirms significant left hydronephrosis. After needle puncture, there was immediate return of overtly purulent fluid consistent with pyonephrosis. After placement of the 10 French nephrostomy tubes, there is rapid drainage of purulent fluid.  IMPRESSION: Left percutaneous nephrostomy tube placement. There was evidence of overt pyonephrosis. A fluid sample was sent for culture analysis. A 10 French nephrostomy tube was placed, formed in the renal pelvis and connected to gravity bag drainage.   Electronically Signed   By: Irish Lack M.D.   On: 08/19/2013 19:58   Dg Chest Port 1 View  08/26/2013   CLINICAL DATA:  Endotracheal tube removal  EXAM: PORTABLE CHEST - 1 VIEW  COMPARISON:  08/24/2013, 08/18/2013  FINDINGS: There has been interval removal of the endotracheal tube. There is a nasogastric tube in unchanged position. There is elevation of the right diaphragm. There is left basilar atelectasis versus scarring. There is no focal consolidation, pleural effusion or pneumothorax. Normal cardiomediastinal silhouette. The osseous structures are unremarkable.  IMPRESSION: Interval removal of  the endotracheal tube. Otherwise no significant interval change.   Electronically Signed   By: Elige Ko   On: 08/26/2013 08:15   Portable Chest Xray  08/24/2013   CLINICAL DATA:  Endotracheal tube placement.  EXAM: PORTABLE CHEST - 1 VIEW  COMPARISON:  08/24/2013 at  1143 hr wound.  FINDINGS: Endotracheal tube is been placed with tip measuring 4.7 cm above the carinal. Enteric tube remains in place without evidence of change. The tip is off the field of view. There is shallow inspiration with linear atelectasis developing in both lung bases. Probable small right pleural effusion. Normal heart size and pulmonary vascularity. No pneumothorax.  IMPRESSION: Endotracheal tube tip measures about 4.7 cm above the carina. Increasing atelectasis in the lung bases. Probable small right pleural effusion.   Electronically Signed   By: Burman Nieves M.D.   On: 08/24/2013 22:21   Dg Chest Port 1 View  08/18/2013   CLINICAL DATA:  Central line placement  EXAM: PORTABLE CHEST - 1 VIEW  COMPARISON:  08/17/2013  FINDINGS: Left internal jugular central line has been placed with tip over the anticipated position of the junction of the brachiocephalic vein and superior vena cava. No pneumothorax. Heart size is normal. Lungs are clear.  IMPRESSION: Central line as described above with no pneumothorax   Electronically Signed   By: Esperanza Heir M.D.   On: 08/18/2013 20:58   Dg Abd 2 Views  08/22/2013   CLINICAL DATA:  Generalized abdominal pain  EXAM: ABDOMEN - 2 VIEW  COMPARISON:  08/18/2013  FINDINGS: There is nonobstructive bowel gas pattern. 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 Left nephrostomy catheter. No free abdominal air. Mild gaseous distended small bowel loops mid abdomen probable mild ileus.  IMPRESSION: Mild gaseous distended small bowel loops mid abdomen probable mild ileus. No air-fluid levels. Left nephrostomy catheter. No free abdominal air.   Electronically Signed   By: Natasha Mead M.D.   On: 08/22/2013 12:18   Dg Kayleen Memos W/water Sol Cm  08/28/2013   CLINICAL DATA:  Evaluate duodenal leak following Cheree Ditto patch placement.  EXAM: WATER SOLUBLE UPPER GI SERIES  TECHNIQUE: Single-column upper GI series was performed using water soluble contrast.  CONTRAST:   150 mL Omnipaque 300  COMPARISON:  None.  FLUOROSCOPY TIME:  52 seconds  FINDINGS: A preprocedural KUB was performed demonstrating the nasogastric tube with the tip projecting over the stomach, but the proximal port is proximal to the esophagogastric junction. Recommend advancing the nasogastric tube 10 cm.  150 mL Omnipaque 300 was hand injected through an existing nasogastric tube. Following injection of contrast the stomach and duodenum was evaluated under realtime fluoroscopy. The patient was placed in the supine and right and left decubitus positions. Contrast is observed in the gastric antrum traversing the duodenum and into the proximal jejunum. There is no extravasation of contrast. Following the fluoroscopic evaluation a postprocedural KUB was performed demonstrating no extraluminal contrast.  There is reflux of contrast into the esophagus.  IMPRESSION: No extraluminal contrast identified to suggest a gastric or duodenal leak.  Nasogastric tube with the tip projecting over the stomach, but the proximal port is proximal to the esophagogastric junction. Recommend advancing the nasogastric tube 10 cm.   Electronically Signed   By: Elige Ko   On: 08/28/2013 09:09    CBC  Recent Labs Lab 08/28/13 0500 08/30/13 0820 09/01/13 0550 09/02/13 0710  WBC 10.2 9.4 11.3* 9.4  HGB 9.1* 9.3* 9.4* 9.0*  HCT  28.2* 29.3* 29.7* 27.3*  PLT 285 252 268 256  MCV 89.5 91.0 92.2 90.4  MCH 28.9 28.9 29.2 29.8  MCHC 32.3 31.7 31.6 33.0  RDW 18.9* 18.9* 19.0* 18.7*    Chemistries   Recent Labs Lab 08/29/13 0534 08/30/13 0820 08/31/13 1014 09/01/13 0550 09/02/13 0710  NA 147* 140 141 138 137  K 3.8 4.3 4.7 4.3 4.1  CL 119* 116* 113* 111 110  CO2 18* 13* 19 19 21   GLUCOSE 96 92 110* 107* 102*  BUN 43* 31* 23 19 17   CREATININE 1.96* 1.63* 1.63* 1.56* 1.59*  CALCIUM 7.4* 7.5* 7.7* 7.6* 7.7*    ------------------------------------------------------------------------------------------------------------------ estimated creatinine clearance is 56.4 ml/min (by C-G formula based on Cr of 1.59). ------------------------------------------------------------------------------------------------------------------ No results found for this basename: HGBA1C,  in the last 72 hours ------------------------------------------------------------------------------------------------------------------ No results found for this basename: CHOL, HDL, LDLCALC, TRIG, CHOLHDL, LDLDIRECT,  in the last 72 hours ------------------------------------------------------------------------------------------------------------------ No results found for this basename: TSH, T4TOTAL, FREET3, T3FREE, THYROIDAB,  in the last 72 hours ------------------------------------------------------------------------------------------------------------------ No results found for this basename: VITAMINB12, FOLATE, FERRITIN, TIBC, IRON, RETICCTPCT,  in the last 72 hours  Coagulation profile No results found for this basename: INR, PROTIME,  in the last 168 hours  No results found for this basename: DDIMER,  in the last 72 hours  Cardiac Enzymes No results found for this basename: CK, CKMB, TROPONINI, MYOGLOBIN,  in the last 168 hours ------------------------------------------------------------------------------------------------------------------ No components found with this basename: POCBNP,

## 2013-09-02 NOTE — Progress Notes (Signed)
INFECTIOUS DISEASE PROGRESS NOTE  ID: Nathan Rivera is a 67 y.o. male with  Principal Problem:   Severe sepsis Active Problems:   PVD (peripheral vascular disease)   Hypertension   Acute renal failure   Hydronephrosis, left   Renal abscess   Acute respiratory failure with hypoxia   Dehydration   Bacteremia due to ESBL Escherichia coli  Subjective: Tired after getting PIC  Abtx:  Anti-infectives   Start     Dose/Rate Route Frequency Ordered Stop   09/03/13 0600  ciprofloxacin (CIPRO) IVPB 400 mg    Comments:  Begin on call to IR   400 mg 200 mL/hr over 60 Minutes Intravenous On call 09/02/13 1105 09/04/13 0600   08/29/13 1230  imipenem-cilastatin (PRIMAXIN) 500 mg in sodium chloride 0.9 % 100 mL IVPB     500 mg 200 mL/hr over 30 Minutes Intravenous Every 8 hours 08/29/13 0802     08/27/13 0200  imipenem-cilastatin (PRIMAXIN) 250 mg in sodium chloride 0.9 % 100 mL IVPB  Status:  Discontinued     250 mg 200 mL/hr over 30 Minutes Intravenous Every 6 hours 08/27/13 0051 08/29/13 0802   08/26/13 1400  vancomycin (VANCOCIN) 1,500 mg in sodium chloride 0.9 % 500 mL IVPB  Status:  Discontinued     1,500 mg 250 mL/hr over 120 Minutes Intravenous Every 48 hours 08/24/13 1301 08/24/13 2004   08/25/13 1800  imipenem-cilastatin (PRIMAXIN) 250 mg in sodium chloride 0.9 % 100 mL IVPB  Status:  Discontinued     250 mg 200 mL/hr over 30 Minutes Intravenous Every 12 hours 08/24/13 1245 08/27/13 0051   08/24/13 2345  fluconazole (DIFLUCAN) IVPB 200 mg     200 mg 100 mL/hr over 60 Minutes Intravenous Every 24 hours 08/24/13 2332     08/24/13 1315  vancomycin (VANCOCIN) 2,250 mg in sodium chloride 0.9 % 500 mL IVPB  Status:  Discontinued     2,250 mg 250 mL/hr over 120 Minutes Intravenous  Once 08/24/13 1301 08/24/13 2004   08/20/13 1200  imipenem-cilastatin (PRIMAXIN) 250 mg in sodium chloride 0.9 % 100 mL IVPB  Status:  Discontinued     250 mg 200 mL/hr over 30 Minutes Intravenous 4 times  per day 08/20/13 1132 08/24/13 1245   08/18/13 2200  ceFEPIme (MAXIPIME) 1 g in dextrose 5 % 50 mL IVPB  Status:  Discontinued     1 g 100 mL/hr over 30 Minutes Intravenous Every 12 hours 08/18/13 1846 08/18/13 1847   08/18/13 2000  ceFEPIme (MAXIPIME) 1 g in dextrose 5 % 50 mL IVPB  Status:  Discontinued     1 g 100 mL/hr over 30 Minutes Intravenous Every 24 hours 08/18/13 1728 08/18/13 1846   08/18/13 1900  ceFEPIme (MAXIPIME) 1 g in dextrose 5 % 50 mL IVPB  Status:  Discontinued     1 g 100 mL/hr over 30 Minutes Intravenous Every 24 hours 08/18/13 1847 08/20/13 1132      Medications:  Scheduled: . antiseptic oral rinse  15 mL Mouth Rinse QID  . [START ON 09/03/2013] ciprofloxacin  400 mg Intravenous On Call  . enoxaparin (LOVENOX) injection  40 mg Subcutaneous Daily  . fluconazole (DIFLUCAN) IV  200 mg Intravenous Q24H  . imipenem-cilastatin  500 mg Intravenous Q8H  . pantoprazole  40 mg Oral Q12H    Objective: Vital signs in last 24 hours: Temp:  [97.3 F (36.3 C)-98.3 F (36.8 C)] 97.3 F (36.3 C) (12/08 1355) Pulse Rate:  [85-105]  105 (12/08 1355) Resp:  [18-20] 20 (12/08 1355) BP: (100-119)/(69-74) 100/74 mmHg (12/08 1355) SpO2:  [94 %-96 %] 94 % (12/08 1355)   General appearance: alert, cooperative, no distress and pale Resp: clear to auscultation bilaterally Cardio: regular rate and rhythm GI: normal findings: bowel sounds normal and soft, non-tender  Lab Results  Recent Labs  09/01/13 0550 09/02/13 0710  WBC 11.3* 9.4  HGB 9.4* 9.0*  HCT 29.7* 27.3*  NA 138 137  K 4.3 4.1  CL 111 110  CO2 19 21  BUN 19 17  CREATININE 1.56* 1.59*   Liver Panel No results found for this basename: PROT, ALBUMIN, AST, ALT, ALKPHOS, BILITOT, BILIDIR, IBILI,  in the last 72 hours Sedimentation Rate No results found for this basename: ESRSEDRATE,  in the last 72 hours C-Reactive Protein No results found for this basename: CRP,  in the last 72  hours  Microbiology: Recent Results (from the past 240 hour(s))  MRSA PCR SCREENING     Status: None   Collection Time    08/24/13  8:07 PM      Result Value Range Status   MRSA by PCR NEGATIVE  NEGATIVE Final   Comment:            The GeneXpert MRSA Assay (FDA     approved for NASAL specimens     only), is one component of a     comprehensive MRSA colonization     surveillance program. It is not     intended to diagnose MRSA     infection nor to guide or     monitor treatment for     MRSA infections.  BODY FLUID CULTURE     Status: None   Collection Time    08/25/13  3:36 PM      Result Value Range Status   Specimen Description PERITONEAL CAVITY   Final   Special Requests Normal   Final   Gram Stain     Final   Value: ABUNDANT WBC PRESENT,BOTH PMN AND MONONUCLEAR     NO ORGANISMS SEEN     Performed at [facility]   Culture     Final   Value: NO GROWTH 3 DAYS     Performed at [facility]   Report Status 08/29/2013 FINAL   Final    Studies/Results: Ct Abdomen Pelvis Wo Contrast  09/01/2013   CLINICAL DATA:  Kidney infection.  Perforated duodenal ulcer.  EXAM: CT ABDOMEN AND PELVIS WITHOUT CONTRAST  TECHNIQUE: Multidetector CT imaging of the abdomen and pelvis was performed following the standard protocol without intravenous contrast.  COMPARISON:  CT scan dated 08/24/2013  FINDINGS: There is a persistent small right pleural effusion with persistent atelectasis of much of the right lower lobe. Atelectasis at the left base has almost completely resolved.  The patient has undergone repair of a perforated duodenal ulcer. A drain is in place at that site. There are multiple small air bubbles in the soft tissues just above the duodenal bulb a but there is no pus collection.  There are few small stones in the neck of the gallbladder. No bile duct dilatation.  Ascites has almost resolved. There is residual fluid loculated along the inferior medial aspect of the right  lobe of the liver.  Numerous calcified granulomas in the otherwise normal-appearing spleen. Pancreas and adrenal glands are normal. Pigtail catheter is seen in the left renal pelvis. There is no hydronephrosis. There is a 15 mm stone in  the proximal left ureter, unchanged.  There are multiple low-density lesions in both kidneys, unchanged since multiple prior studies.  There are numerous diverticula in the nondistended colon. Aorto bi-iliac stent graft is in place. There is a large amount of stool and barium in the rectum.  IMPRESSION: 1. There is small amount of residual in ascites loculated around the liver but most of the ascites has resolved. 2. There is no abscess around the site of the previous duodenal perforation. There are multiple small air bubbles just above the duodenum bulb. 3. Percutaneous stent remains in the proximal left renal collecting system and the 15 mm stone remains in the proximal left ureter. No residual left hydronephrosis.   Electronically Signed   By: Geanie Cooley M.D.   On: 09/01/2013 15:38     Assessment/Plan: Perforated duodenal ulcer (11-29)  Sepsis  Renal Abscess, ESBL E coli (11-23)  1 cm L ureteral calculus  ARF- improving  Total days of antibiotics: 16 (imipenem/diflucan day 10)   WBC normal, afebrile  Cr continues to improve (although slightly up today from yesterday) Can change his imipenem to invanz at d/c.  Will stop diflucan.  Would aim for 21 days of anbx from his renal drain on 11-23 (end date 12-14).  Repeat CT abd: no abscess.  For perc-nephrostomy tomorrow. Probably home on Wednesday.  available if questions         Johny Sax Infectious Diseases (pager) [phone] [email or web address] 09/02/2013, 4:04 PM  LOS: 15 days

## 2013-09-02 NOTE — Progress Notes (Signed)
Physical Therapy Treatment Patient Details Name: Nathan Rivera MRN: [id number] DOB: 02-19-1946 Today's Date: 09/02/2013 Time: 4098-1191 PT Time Calculation (min): 32 min  PT Assessment / Plan / Recommendation  History of Present Illness pt presents with perforated duodenal ulcer, sepsis, renal abscess, and hydronephrosis.     PT Comments   Pt progressing with mobility with max motivation required to participate. Pt educated on importance of ambulation and importance of slowly increasing activity. Recommended to attempt stair training prior to d/c home. Pt will benefit from HHPT to maximize I at home and increase mobility.    Follow Up Recommendations  Home health PT;Supervision/Assistance - 24 hour     Does the patient have the potential to tolerate intense rehabilitation     Barriers to Discharge        Equipment Recommendations  Rolling walker with 5" wheels    Recommendations for Other Services    Frequency Min 3X/week   Progress towards PT Goals Progress towards PT goals: Progressing toward goals  Plan Current plan remains appropriate    Precautions / Restrictions Precautions Precautions: Fall Precaution Comments: Drain on Lt side of back Restrictions Weight Bearing Restrictions: No   Pertinent Vitals/Pain 8-10/10 with coughing; pt denied request to ask RN for medications; pt positioned for comfort post tx    Mobility  Bed Mobility Bed Mobility: Rolling Left;Left Sidelying to Sit;Sitting - Scoot to [place] of Bed Rolling Left: 4: Min assist Left Sidelying to Sit: 4: Min assist Sitting - Scoot to Edge of Bed: 4: Min guard Details for Bed Mobility Assistance: cues for log roll technique; min assist required without rails and HOB flat to simulate home environment.  Transfers Transfers: Sit to Stand;Stand to Sit Sit to Stand: 4: Min guard;From bed;From chair/3-in-1 Stand to Sit: 4: Min guard;To chair/3-in-1 Details for Transfer Assistance: Cues for hand placement and  safety Ambulation/Gait Ambulation/Gait Assistance: 4: Min guard Ambulation Distance (Feet): 100 Feet Assistive device: Rolling walker Ambulation/Gait Assistance Details: 50', 100' with seated rest break between due to abdominal pain after coughing. motivation required to continue with ambulation. cuesfor upright posture Gait Pattern: Step-through pattern;Trunk flexed;Decreased stride length    Exercises General Exercises - Lower Extremity Ankle Circles/Pumps: AROM;Both;10 reps;Seated   PT Diagnosis:    PT Problem List:   PT Treatment Interventions:     PT Goals (current goals can now be found in the care plan section)    Visit Information  Last PT Received On: 09/02/13 Assistance Needed: +1 History of Present Illness: pt presents with perforated duodenal ulcer, sepsis, renal abscess, and hydronephrosis.      Subjective Data      Cognition  Cognition Arousal/Alertness: Awake/alert Behavior During Therapy: WFL for tasks assessed/performed Overall Cognitive Status: Within Functional Limits for tasks assessed    Balance     End of Session PT - End of Session Equipment Utilized During Treatment: Gait belt Activity Tolerance: Patient limited by fatigue Patient left: in chair;with call bell/phone within reach;with family/visitor present Nurse Communication: Mobility status   GP     Ernestina Columbia, SPTA 09/02/2013, 1:03 PM

## 2013-09-02 NOTE — Progress Notes (Signed)
NUTRITION FOLLOW UP  Intervention:   Pt tolerating regular diet and denied need for nutritional supplements.  RD will continue to monitor intake.  Nutrition Dx:   Inadequate oral intake related to omission of energy dense foods as evidenced by NPO/CL; resolved  Goal:   Pt to meet >/= 90% of their estimated nutrition needs; improving  Monitor:   Wt, po intake, labs, I/O's  Assessment:   Pt transferred from [facility] hospital with hematuria and UTI with severe left-sided hydronephrosis and need for nephrostomy tube. Pt developed ESBL bacteremia renal abscess, septic shock. Now with perforated visus s/p emergent ex lap for perforated duodenal ulcer with free air. NGT removed today and diet advanced to sips/clears. Pt has largely been NPO since admission.   12/6- diet advanced to regular diet  Pt reports that he is tolerating his diet and is completing about 50% of each meal. He says that he does not feel a need for nutritional supplements at this time.   Height: Ht Readings from Last 1 Encounters:  08/24/13 5\' 11"  (1.803 m)    Weight Status:   Wt Readings from Last 1 Encounters:  08/24/13 239 lb (108.41 kg)    Re-estimated needs:  Kcal: 9147-8295 Protein: 100-110 g Fluid: 2.4-2.7 L  Skin: non-pitting edema  Diet Order: General   Intake/Output Summary (Last 24 hours) at 09/02/13 1057 Last data filed at 09/02/13 0450  Gross per 24 hour  Intake    480 ml  Output   2025 ml  Net  -1545 ml    Last BM: none recorded   Labs:   Recent Labs Lab 08/31/13 1014 09/01/13 0550 09/02/13 0710  NA 141 138 137  K 4.7 4.3 4.1  CL 113* 111 110  CO2 19 19 21   BUN 23 19 17   CREATININE 1.63* 1.56* 1.59*  CALCIUM 7.7* 7.6* 7.7*  GLUCOSE 110* 107* 102*    CBG (last 3)  No results found for this basename: GLUCAP,  in the last 72 hours  Scheduled Meds: . antiseptic oral rinse  15 mL Mouth Rinse QID  . enoxaparin (LOVENOX) injection  40 mg Subcutaneous Daily  . fluconazole  (DIFLUCAN) IV  200 mg Intravenous Q24H  . imipenem-cilastatin  500 mg Intravenous Q8H  . pantoprazole  40 mg Oral Q12H    Continuous Infusions: . dextrose 5 % and 0.45 % NaCl with KCl 20 mEq/L 75 mL/hr at 09/02/13 0435    Ebbie Latus RD, LDN

## 2013-09-02 NOTE — Progress Notes (Signed)
[facility]  Patient Status: New pt to [facility] this admission  [facility] is providing the following services: HH RN and [facility] for home IV ABX.  [facility] hospital coordinator team will follow pt this admission to support DC home when deemed appropriate by MD.  If patient discharges after hours, please call [phone].   Nathan Rivera 09/02/2013, 2:16 PM

## 2013-09-02 NOTE — Progress Notes (Signed)
Pt. Refusing to ambulate.  Pt. States "I don't feel up to it right now.  I need a down day.  I will walk tomorrow."  Educated pt on importance of ambulating.  Pt. Agreeable to ambulating early in AM.

## 2013-09-02 NOTE — Consult Note (Signed)
Agree 

## 2013-09-02 NOTE — Progress Notes (Signed)
Seen and agreed 10/23/2012 Robinette, Julia Elizabeth PTA [phone] pager [phone] office    

## 2013-09-03 ENCOUNTER — Inpatient Hospital Stay (HOSPITAL_COMMUNITY): Payer: Medicare Other

## 2013-09-03 LAB — CBC
HCT: 29.3 % — ABNORMAL LOW (ref 39.0–52.0)
Hemoglobin: 9.2 g/dL — ABNORMAL LOW (ref 13.0–17.0)
MCH: 28.9 pg (ref 26.0–34.0)
MCHC: 31.4 g/dL (ref 30.0–36.0)
MCV: 92.1 fL (ref 78.0–100.0)
Platelets: 290 10*3/uL (ref 150–400)
RBC: 3.18 MIL/uL — ABNORMAL LOW (ref 4.22–5.81)

## 2013-09-03 LAB — BASIC METABOLIC PANEL
BUN: 15 mg/dL (ref 6–23)
CO2: 22 mEq/L (ref 19–32)
Calcium: 8 mg/dL — ABNORMAL LOW (ref 8.4–10.5)
Creatinine, Ser: 1.62 mg/dL — ABNORMAL HIGH (ref 0.50–1.35)
GFR calc non Af Amer: 42 mL/min — ABNORMAL LOW (ref >90)
Glucose, Bld: 97 mg/dL (ref 70–99)
Sodium: 136 mEq/L (ref 135–145)

## 2013-09-03 MED ORDER — TRAMADOL HCL 50 MG PO TABS: 50.0000 mg | ORAL_TABLET | Freq: Four times a day (QID) | ORAL | Status: DC | PRN

## 2013-09-03 MED ORDER — FENTANYL CITRATE 0.05 MG/ML IJ SOLN
INTRAMUSCULAR | Status: AC
  Filled 2013-09-03: qty 4

## 2013-09-03 MED ORDER — IOHEXOL 300 MG/ML  SOLN
50.0000 mL | Freq: Once | INTRAMUSCULAR | Status: AC | PRN
  Administered 2013-09-03: 25 mL

## 2013-09-03 MED ORDER — PANTOPRAZOLE SODIUM 40 MG PO TBEC: 40.0000 mg | DELAYED_RELEASE_TABLET | Freq: Two times a day (BID) | ORAL | Status: DC

## 2013-09-03 MED ORDER — FENTANYL CITRATE 0.05 MG/ML IJ SOLN
INTRAMUSCULAR | Status: AC | PRN
  Administered 2013-09-03 (×2): 50 ug via INTRAVENOUS

## 2013-09-03 MED ORDER — MIDAZOLAM HCL 2 MG/2ML IJ SOLN
INTRAMUSCULAR | Status: AC
  Filled 2013-09-03: qty 4

## 2013-09-03 MED ORDER — SODIUM CHLORIDE 0.9 % IV SOLN: 1.0000 g | INTRAVENOUS | Status: DC

## 2013-09-03 MED ORDER — MIDAZOLAM HCL 2 MG/2ML IJ SOLN
INTRAMUSCULAR | Status: AC | PRN
  Administered 2013-09-03 (×2): 1 mg via INTRAVENOUS

## 2013-09-03 NOTE — Discharge Summary (Addendum)
Physician Discharge Summary  Nathan Rivera ZOX:[id number] DOB: [birth year] DOA: 08/18/2013  PCP: Nathan Goodell, MD  Admit date: 08/18/2013 Discharge date: 09/04/2013  Time spent: 35 minutes  Recommendations for Outpatient Follow-up:  Patient will be discharged to home. He'll also have home health. He will need continue his antibiotic therapy for an additional 4 days to his PICC line. He will need to follow his primary care physician as well as Dr. Ninetta Lights, infectious disease within one week of discharge. He will also to followup with the urologist at time of discharge. Patient should also follow up with [facility] within one to 2 weeks of discharge. Patient will need to discuss with his primary care physician his antihypertensive medications. This is all discussed with the patient he does understand agree.   Discharge Diagnoses:  Principal Problem:   Severe sepsis Active Problems:   PVD (peripheral vascular disease)   Hypertension   Acute renal failure   Hydronephrosis, left   Renal abscess   Acute respiratory failure with hypoxia   Dehydration   Bacteremia due to ESBL Escherichia coli   Discharge Condition: Stable  Diet recommendation: Heart Healthy  Filed Weights   08/18/13 1612 08/20/13 1955 08/24/13 2030  Weight: 110.1 kg (242 lb 11.6 oz) 110.859 kg (244 lb 6.4 oz) 108.41 kg (239 lb)    History of present illness:  Patient is a 67 year old male with history of hypertension, anemia, history of bladder cancer , hyperlipidemia, tobacco abuse, nephrolithiasis was originally admitted on 08/17/13 at [facility] hospital. Patient was apparently having issues with hematuria and UTI as an outpatient and was being treated with ciprofloxacin and Augmentin. Patient was lethargic on the day of admission and hypotensive with BP of 74/44. He was admitted to the ICU with septic shock and was found to have gram-negative sepsis ( final blood cultures are still  pending). Ultrasound of the abdomen did not show any evidence for obstruction in the gallbladder area but was noted to have 1 cm stone with hydronephrosis. CT of the abdomen done today showed minimal cholelithiasis, severe left hydronephrosis secondary to 15 x 7 MM calculus in the middle portion of the left ureter. Urology was consulted and patient was seen by Dr. Yevonne Rivera at [facility] who felt given patient's severe sepsis picture, significant leukocytosis, elevated creatinine and large proximal ureteral stone, he needs a nephrostomy tube. Risk of stent would be inability to pass the stent or it might not fully drain his urinary tract and hence a nephrostomy tube was felt more appropriate. Patient was transferred to [facility] for emergentinterventional radiology consultation for nephrostomy tube.   Hospital Course:  This is a 67 year old male with a history of hypertension, anemia, history of bladder cancer, hyperlipidemia, nephrolithiasis that was originally admitted on 08/17/2013 [place] hospital. Patient was transferred to [facility] for emergent intervention radiology consultation and nephrostomy tube placement. After mission patient developed refractory hypotension that required the use of pressors and patient was transferred to the ICU. He had undergone successful placement of a percutaneous nephrostomy tube by interventional radiology and was also noted to have frank purulence. Patient was then transferred to step down unit where he was stabilized and transferred to regular floor. We are. Several days patient began to develop abdominal pain with distention and inability to pass flatus. Initial x-rays were concerning for ileus but due to increasing pain CT abdomen was obtained and free air was revealed. Patient was suddenly taken to the operating room on 08/24/2013 at which  point he underwent exploratory laparotomy with repair of a perforated duodenal ulcer. Patient was then sent back to  the ICU immediately after postop and remained intubated for a total 24 hours. After being stabilized, patient was then transferred to regular medical floor on 08/26/2013. Patient was eventually advanced to a regular diet was able to tolerate small amounts well. He was continued on IV antibiotics as well. Patient's severe sepsis with leukocytosis was improving. This was thought to be secondary to urinary tract involvement versus a perforated duodenal ulcer. Infectious diseases also been following the patient and recommended antibiotics to be continued for his initial drain placement until 09/08/2013. Patient will be discharged to home with a PICC line and will receive Invanz additional 4 doses. Patient was also noted to have hyponatremia which is likely secondary to dehydration. This did resolve his sodium did remain stable. As the patient's hydronephrosis as well as left renal abscess and ureteral stone, percutaneous nephrostomy tube was in place however interventional radiology had been reconstituted for internalizing the tube with stent placement. A repeat CT scan did show 50 mm stone in the left proximal ureter. Patient is to follow up with urology as an outpatient. Upon admission patient was noted to have bacteremia which did show ESBL E coli. This was initially treated with cefepime however infectious disease did recommend starting imipenem.  Repeat blood cultures have remained negative. She was also noted to have acute renal failure this has a resolving patient is currently at his baseline approximately 1.5-1.6. This was thought to be secondary to obstructive uropathy due to his hydronephrosis and stone.  Patient did have acute respiratory failure with hypoxia during his initial hospital course however this is likely secondary to sepsis as well as acute renal failure and volume overload. This did resolve during his hospital course. Patient does have a history of hypertension however his home medications are  currently being held as patient's blood pressure has been normotensive as well as on the softer side. Restarting his antihypertensive medication should be discussed with his primary care physician. Patient was also noted to have anemia which is likely secondary to surgery. However his hemoglobin and hematocrit were followed and monitored which did remain stable. Patient did not have any active signs of bleeding. He should continue having his CBC monitored by his primary care physician within one week of discharge.  Again patient will be discharged to home with home health. He has had PICC line placed. He will receive an additional 4 doses of Invanz. Patient should follow up with his primary care physician as well as Dr. Ninetta Lights and neurologist as well as [facility]. This was discussed with the patient he does understand agree.  Procedures: -Status post left percutaneous nephrostomy  -Exploratory laparotomy with patch closure/omental patch perforated duodenal ulcer with primary repair -Nephrostomy tube internalization with stent by IR  Consultations: [facility] Infectious disease Interventional radiology [facility], Dr. Vernie Ammons via phone  Discharge Exam: Filed Vitals:   09/03/13 0550  BP: 115/65  Pulse: 88  Temp: 98.8 F (37.1 C)  Resp: 18   Exam  General: Well developed, well nourished, NAD, appears stated age  HEENT: NCAT, PERRLA, EOMI, Anicteic Sclera, mucous membranes moist.  Neck: Supple, no JVD, no masses  Cardiovascular: S1 S2 auscultated, no rubs, murmurs or gallops. Regular rate and rhythm.  Respiratory: Clear to auscultation bilaterally with equal chest rise  Abdomen: Soft, nontender, nondistended, + bowel sounds, right drain in place  Extremities: warm dry  without cyanosis clubbing or edema  Neuro: AAOx3, cranial nerves grossly intact.  Skin: Without rashes exudates or nodules  Psych: Normal affect and demeanor with intact  judgement and insight  Discharge Instructions  Discharge Orders   Future Orders Complete By Expires   Diet - low sodium heart healthy  As directed    Discharge instructions  As directed    Comments:     Patient will be discharged to home. He'll also have home health. He will need continue his antibiotic therapy for an additional 4 days to his PICC line. He will need to follow his primary care physician as well as Dr. Ninetta Lights, infectious disease within one week of discharge. He will also to followup with the urologist at time of discharge. Patient should also follow up with [facility] within one to 2 weeks of discharge. Patient will need to discuss with his primary care physician his antihypertensive medications. This is all discussed with the patient he does understand agree.   Increase activity slowly  As directed        Medication List    STOP taking these medications       amLODipine 5 MG tablet  Commonly known as:  NORVASC     etodolac 500 MG tablet  Commonly known as:  LODINE     losartan 100 MG tablet  Commonly known as:  COZAAR      TAKE these medications       cholecalciferol 1000 UNITS tablet  Commonly known as:  VITAMIN D  Take 1,000 Units by mouth daily.     pantoprazole 40 MG tablet  Commonly known as:  PROTONIX  Take 1 tablet (40 mg total) by mouth every 12 (twelve) hours.     simvastatin 20 MG tablet  Commonly known as:  ZOCOR  Take 20 mg by mouth at bedtime.     sodium chloride 0.9 % SOLN 50 mL with ertapenem 1 G SOLR 1 g  Inject 1 g into the vein daily.     traMADol 50 MG tablet  Commonly known as:  ULTRAM  Take 1 tablet (50 mg total) by mouth every 6 (six) hours as needed for moderate pain or severe pain.       No Known Allergies     Follow-up Information   Follow up with Crist Fat, MD. (call for appointment as soon as d/c'd)    Specialty:  Urology   Contact information:   [address] [place]  [place] [address] [phone]       Follow up with [facility]. Schedule an appointment as soon as possible for a visit in 2 weeks.   Contact information:   [address] [place] [address]       Follow up with [facility], DALE E, MD. Schedule an appointment as soon as possible for a visit in 1 week.   Specialty:  Family Medicine   Contact information:   [address] [place] [address] [phone]       Follow up with Johny Sax, MD. Schedule an appointment as soon as possible for a visit in 1 week.   Specialty:  Infectious Diseases   Contact information:   [address]  [address] [place] [address] [phone]        The results of significant diagnostics from this hospitalization (including imaging, microbiology, ancillary and laboratory) are listed below  for reference.    Significant Diagnostic Studies: Ct Abdomen Pelvis Wo Contrast  09/01/2013   CLINICAL DATA:  Kidney infection.  Perforated duodenal ulcer.  EXAM: CT ABDOMEN AND PELVIS WITHOUT CONTRAST  TECHNIQUE: Multidetector CT imaging of the abdomen and pelvis was performed following the standard protocol without intravenous contrast.  COMPARISON:  CT scan dated 08/24/2013  FINDINGS: There is a persistent small right pleural effusion with persistent atelectasis of much of the right lower lobe. Atelectasis at the left base has almost completely resolved.  The patient has undergone repair of a perforated duodenal ulcer. A drain is in place at that site. There are multiple small air bubbles in the soft tissues just above the duodenal bulb a but there is no pus collection.  There are few small stones in the neck of the gallbladder. No bile duct dilatation.  Ascites has almost resolved. There is residual fluid loculated along the inferior medial aspect of the right lobe of the liver.  Numerous calcified granulomas in the otherwise  normal-appearing spleen. Pancreas and adrenal glands are normal. Pigtail catheter is seen in the left renal pelvis. There is no hydronephrosis. There is a 15 mm stone in the proximal left ureter, unchanged.  There are multiple low-density lesions in both kidneys, unchanged since multiple prior studies.  There are numerous diverticula in the nondistended colon. Aorto bi-iliac stent graft is in place. There is a large amount of stool and barium in the rectum.  IMPRESSION: 1. There is small amount of residual in ascites loculated around the liver but most of the ascites has resolved. 2. There is no abscess around the site of the previous duodenal perforation. There are multiple small air bubbles just above the duodenum bulb. 3. Percutaneous stent remains in the proximal left renal collecting system and the 15 mm stone remains in the proximal left ureter. No residual left hydronephrosis.   Electronically Signed   By: Geanie Cooley M.D.   On: 09/01/2013 15:38   Ct Abdomen Pelvis Wo Contrast  08/24/2013   CLINICAL DATA:  Abdominal pain, sepsis, leukocytosis. "Coffee-ground" fluid draining from NG tube.  EXAM: CT ABDOMEN AND PELVIS WITHOUT CONTRAST  TECHNIQUE: Multidetector CT imaging of the abdomen and pelvis was performed following the standard protocol without intravenous contrast.  COMPARISON:  [facility] CT abdomen pelvis dated 08/18/2013  FINDINGS: Motion degraded images.  Small right pleural effusion. Associated lower lobe atelectasis bilaterally.  Unenhanced liver, pancreas, and adrenal glands are within normal limits.  Numerous splenic granulomata.  Interval placement of a left percutaneous nephrostomy catheter with decompression of the prior left hydronephrosis. Bilateral renal cysts, poorly evaluated in the absence of intravenous contrast. Stable 10 mm proximal left ureteral calculus (series 2/ image 64).  Atherosclerotic calcifications of the abdominal aorta and branch vessels. Aorto bi-iliac stent graft.   Moderate abdominopelvic ascites, new.  Interval development of moderate free intraperitoneal gas. This would not be accounted for by the nephrostomy placement, which was retroperitoneal. In the absence of interval intraperitoneal procedure, this is worrisome for a perforated hollow viscus. Given the presence of inflammatory stranding (series 2/image 36) and localized gas near the gastric antrum (series 2/image 32), a perforated gastric ulcer is considered.  No suspicious abdominopelvic lymphadenopathy.  Prostate is unremarkable.  Mildly thick-walled bladder (Series 2/ image 99).  Mild degenerative changes of the visualized thoracolumbar spine.  IMPRESSION: Interval development of moderate free intraperitoneal gas, suggesting a perforated hollow viscus. Although the exact etiology is unclear, a perforated gastric ulcer is considered.  Moderate abdominopelvic ascites, new.  Interval placement of a left percutaneous nephrostomy catheter with decompression of prior left hydronephrosis. Stable 10 mm proximal left ureteral calculus.  Mildly thick-walled bladder, correlate for cystitis.  Critical value/emergent results were called by telephone at the time of interpretation on 08/24/2013 at 12:40 PM to Dr.[facility] , who verbally acknowledged these results.   Electronically Signed   By: Charline Bills M.D.   On: 08/24/2013 12:44   Dg Chest 1 View  08/24/2013   CLINICAL DATA:  Decreasing oxygen saturation, abdominal pain  EXAM: CHEST - 1 VIEW  COMPARISON:  08/18/2013; 08/17/2013; CT abdomen pelvis -08/24/2013  FINDINGS: Grossly unchanged borderline enlarged cardiac silhouette and mediastinal contours. Interval placement of enteric tube tip and side port projecting below the left hemidiaphragm. Interval removal of left jugular approach central venous catheter. Overall improved aeration of the lungs. There is persistent mild elevation of the right hemidiaphragm and minimal bibasilar opacities, right greater than  left. No new focal airspace opacities. Trace right-sided effusion is not excluded. No pneumothorax. Unchanged bones.  IMPRESSION: 1. Overall improved aeration of the lungs suggests resolving edema and/or atelectasis. 2. Persistent mild elevation of the right hemidiaphragm with bibasilar opacities, right greater than left, likely atelectasis. No new focal airspace opacities. 3. Enteric tube tip and side port projects below the left hemidiaphragm.   Electronically Signed   By: Simonne Come M.D.   On: 08/24/2013 12:50   Ir Perc Nephrostomy Left  08/19/2013   CLINICAL DATA:  Septic shock, gram negative bacteremia and left hydronephrosis secondary to an obstructing ureteral calculus.  EXAM: 1. ULTRASOUND GUIDANCE FOR PUNCTURE OF THE LEFT RENAL COLLECTING SYSTEM. 2. LEFT PERCUTANEOUS NEPHROSTOMY TUBE PLACEMENT.  MEDICATIONS: No additional medications were administered.  ANESTHESIA/SEDATION: 1.0 mg IV Versed; 25 mcg IV Fentanyl.  Total Moderate Sedation Time  ONE 3 minutes  CONTRAST:  10 ml Omnipaque 300  COMPARISON:  None.  FLUOROSCOPY TIME:  30 seconds.  PROCEDURE: The procedure, risks, benefits, and alternatives were explained to the patient. Questions regarding the procedure were encouraged and answered. The patient understands and consents to the procedure.  The left flank region was prepped with Betadine in a sterile fashion, and a sterile drape was applied covering the operative field. A sterile gown and sterile gloves were used for the procedure. Local anesthesia was provided with 1% Lidocaine.  Ultrasound was used to localize the left kidney. Under direct ultrasound guidance, a 21 gauge needle was advanced into the renal collecting system. Ultrasound image documentation was performed. Aspiration of urine sample was performed followed by contrast injection. A sample of aspirated urine was sent for culture analysis.  A transitional dilator was advanced over a guidewire. Percutaneous tract dilatation was then  performed over the guidewire. A 10 -French percutaneous nephrostomy tube was then advanced and formed in the collecting system. Catheter position was confirmed by fluoroscopy after contrast injection.  The catheter was secured at the skin with a Prolene retention suture and Stat-Lock device. A gravity bag was placed.  COMPLICATIONS: None.  FINDINGS: Ultrasound confirms significant left hydronephrosis. After needle puncture, there was immediate return of overtly purulent fluid consistent with pyonephrosis. After placement of the 10 French nephrostomy tubes, there is rapid drainage of purulent fluid.  IMPRESSION: Left percutaneous nephrostomy tube placement. There was evidence of overt pyonephrosis. A fluid sample was sent for culture analysis. A 10 French nephrostomy tube was placed, formed in the renal pelvis and connected to gravity bag drainage.   Electronically Signed  By: Irish Lack M.D.   On: 08/19/2013 19:58   Ir US Guide Bx Asp/drain  08/19/2013   CLINICAL DATA:  Septic shock, gram negative bacteremia and left hydronephrosis secondary to an obstructing ureteral calculus.  EXAM: 1. ULTRASOUND GUIDANCE FOR PUNCTURE OF THE LEFT RENAL COLLECTING SYSTEM. 2. LEFT PERCUTANEOUS NEPHROSTOMY TUBE PLACEMENT.  MEDICATIONS: No additional medications were administered.  ANESTHESIA/SEDATION: 1.0 mg IV Versed; 25 mcg IV Fentanyl.  Total Moderate Sedation Time  ONE 3 minutes  CONTRAST:  10 ml Omnipaque 300  COMPARISON:  None.  FLUOROSCOPY TIME:  30 seconds.  PROCEDURE: The procedure, risks, benefits, and alternatives were explained to the patient. Questions regarding the procedure were encouraged and answered. The patient understands and consents to the procedure.  The left flank region was prepped with Betadine in a sterile fashion, and a sterile drape was applied covering the operative field. A sterile gown and sterile gloves were used for the procedure. Local anesthesia was provided with 1% Lidocaine.  Ultrasound  was used to localize the left kidney. Under direct ultrasound guidance, a 21 gauge needle was advanced into the renal collecting system. Ultrasound image documentation was performed. Aspiration of urine sample was performed followed by contrast injection. A sample of aspirated urine was sent for culture analysis.  A transitional dilator was advanced over a guidewire. Percutaneous tract dilatation was then performed over the guidewire. A 10 -French percutaneous nephrostomy tube was then advanced and formed in the collecting system. Catheter position was confirmed by fluoroscopy after contrast injection.  The catheter was secured at the skin with a Prolene retention suture and Stat-Lock device. A gravity bag was placed.  COMPLICATIONS: None.  FINDINGS: Ultrasound confirms significant left hydronephrosis. After needle puncture, there was immediate return of overtly purulent fluid consistent with pyonephrosis. After placement of the 10 French nephrostomy tubes, there is rapid drainage of purulent fluid.  IMPRESSION: Left percutaneous nephrostomy tube placement. There was evidence of overt pyonephrosis. A fluid sample was sent for culture analysis. A 10 French nephrostomy tube was placed, formed in the renal pelvis and connected to gravity bag drainage.   Electronically Signed   By: Irish Lack M.D.   On: 08/19/2013 19:58   Dg Chest Port 1 View  08/26/2013   CLINICAL DATA:  Endotracheal tube removal  EXAM: PORTABLE CHEST - 1 VIEW  COMPARISON:  08/24/2013, 08/18/2013  FINDINGS: There has been interval removal of the endotracheal tube. There is a nasogastric tube in unchanged position. There is elevation of the right diaphragm. There is left basilar atelectasis versus scarring. There is no focal consolidation, pleural effusion or pneumothorax. Normal cardiomediastinal silhouette. The osseous structures are unremarkable.  IMPRESSION: Interval removal of the endotracheal tube. Otherwise no significant interval change.    Electronically Signed   By: Elige Ko   On: 08/26/2013 08:15   Portable Chest Xray  08/24/2013   CLINICAL DATA:  Endotracheal tube placement.  EXAM: PORTABLE CHEST - 1 VIEW  COMPARISON:  08/24/2013 at 1143 hr wound.  FINDINGS: Endotracheal tube is been placed with tip measuring 4.7 cm above the carinal. Enteric tube remains in place without evidence of change. The tip is off the field of view. There is shallow inspiration with linear atelectasis developing in both lung bases. Probable small right pleural effusion. Normal heart size and pulmonary vascularity. No pneumothorax.  IMPRESSION: Endotracheal tube tip measures about 4.7 cm above the carina. Increasing atelectasis in the lung bases. Probable small right pleural effusion.   Electronically Signed  By: Burman Nieves M.D.   On: 08/24/2013 22:21   Dg Chest Port 1 View  08/18/2013   CLINICAL DATA:  Central line placement  EXAM: PORTABLE CHEST - 1 VIEW  COMPARISON:  08/17/2013  FINDINGS: Left internal jugular central line has been placed with tip over the anticipated position of the junction of the brachiocephalic vein and superior vena cava. No pneumothorax. Heart size is normal. Lungs are clear.  IMPRESSION: Central line as described above with no pneumothorax   Electronically Signed   By: Esperanza Heir M.D.   On: 08/18/2013 20:58   Dg Abd 2 Views  08/22/2013   CLINICAL DATA:  Generalized abdominal pain  EXAM: ABDOMEN - 2 VIEW  COMPARISON:  08/18/2013  FINDINGS: There is nonobstructive bowel gas pattern. 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 Left nephrostomy catheter. No free abdominal air. Mild gaseous distended small bowel loops mid abdomen probable mild ileus.  IMPRESSION: Mild gaseous distended small bowel loops mid abdomen probable mild ileus. No air-fluid levels. Left nephrostomy catheter. No free abdominal air.   Electronically Signed   By: Natasha Mead M.D.   On: 08/22/2013 12:18   Dg Kayleen Memos W/water Sol Cm  08/28/2013    CLINICAL DATA:  Evaluate duodenal leak following Cheree Ditto patch placement.  EXAM: WATER SOLUBLE UPPER GI SERIES  TECHNIQUE: Single-column upper GI series was performed using water soluble contrast.  CONTRAST:  150 mL Omnipaque 300  COMPARISON:  None.  FLUOROSCOPY TIME:  52 seconds  FINDINGS: A preprocedural KUB was performed demonstrating the nasogastric tube with the tip projecting over the stomach, but the proximal port is proximal to the esophagogastric junction. Recommend advancing the nasogastric tube 10 cm.  150 mL Omnipaque 300 was hand injected through an existing nasogastric tube. Following injection of contrast the stomach and duodenum was evaluated under realtime fluoroscopy. The patient was placed in the supine and right and left decubitus positions. Contrast is observed in the gastric antrum traversing the duodenum and into the proximal jejunum. There is no extravasation of contrast. Following the fluoroscopic evaluation a postprocedural KUB was performed demonstrating no extraluminal contrast.  There is reflux of contrast into the esophagus.  IMPRESSION: No extraluminal contrast identified to suggest a gastric or duodenal leak.  Nasogastric tube with the tip projecting over the stomach, but the proximal port is proximal to the esophagogastric junction. Recommend advancing the nasogastric tube 10 cm.   Electronically Signed   By: Elige Ko   On: 08/28/2013 09:09    Microbiology: Recent Results (from the past 240 hour(s))  MRSA PCR SCREENING     Status: None   Collection Time    08/24/13  8:07 PM      Result Value Range Status   MRSA by PCR NEGATIVE  NEGATIVE Final   Comment:            The GeneXpert MRSA Assay (FDA     approved for NASAL specimens     only), is one component of a     comprehensive MRSA colonization     surveillance program. It is not     intended to diagnose MRSA     infection nor to guide or     monitor treatment for     MRSA infections.  BODY FLUID CULTURE      Status: None   Collection Time    08/25/13  3:36 PM      Result Value Range Status   Specimen Description PERITONEAL CAVITY  Final   Special Requests Normal   Final   Gram Stain     Final   Value: ABUNDANT WBC PRESENT,BOTH PMN AND MONONUCLEAR     NO ORGANISMS SEEN     Performed at [facility]   Culture     Final   Value: NO GROWTH 3 DAYS     Performed at [facility]   Report Status 08/29/2013 FINAL   Final     Labs: Basic Metabolic Panel:  Recent Labs Lab 08/30/13 0820 08/31/13 1014 09/01/13 0550 09/02/13 0710 09/03/13 0800  NA 140 141 138 137 136  K 4.3 4.7 4.3 4.1 4.5  CL 116* 113* 111 110 107  CO2 13* 19 19 21 22   GLUCOSE 92 110* 107* 102* 97  BUN 31* 23 19 17 15   CREATININE 1.63* 1.63* 1.56* 1.59* 1.62*  CALCIUM 7.5* 7.7* 7.6* 7.7* 8.0*   Liver Function Tests: No results found for this basename: AST, ALT, ALKPHOS, BILITOT, PROT, ALBUMIN,  in the last 168 hours No results found for this basename: LIPASE, AMYLASE,  in the last 168 hours No results found for this basename: AMMONIA,  in the last 168 hours CBC:  Recent Labs Lab 08/28/13 0500 08/30/13 0820 09/01/13 0550 09/02/13 0710 09/03/13 0800  WBC 10.2 9.4 11.3* 9.4 14.0*  HGB 9.1* 9.3* 9.4* 9.0* 9.2*  HCT 28.2* 29.3* 29.7* 27.3* 29.3*  MCV 89.5 91.0 92.2 90.4 92.1  PLT 285 252 268 256 290   Cardiac Enzymes: No results found for this basename: CKTOTAL, CKMB, CKMBINDEX, TROPONINI,  in the last 168 hours BNP: BNP (last 3 results) No results found for this basename: PROBNP,  in the last 8760 hours CBG: No results found for this basename: GLUCAP,  in the last 168 hours      Signed:  Edsel Petrin  [facility] 09/03/2013, 12:24 PM

## 2013-09-03 NOTE — Procedures (Signed)
Successful placement of left sided 28 cm double J ureteral stent. Successful left sided PCN exchange to maintain temporary nephrostomy access prior to future potential removal.   Left sided PCN capped. No immediate complications.

## 2013-09-03 NOTE — Progress Notes (Signed)
Physical Therapy Treatment Patient Details Name: Nathan Rivera MRN: [id number] DOB: 1945-11-20 Today's Date: 09/03/2013 Time: 4098-1191 PT Time Calculation (min): 42 min  PT Assessment / Plan / Recommendation  History of Present Illness pt presents with perforated duodenal ulcer, sepsis, renal abscess, and hydronephrosis.     PT Comments   Pt progressing towards physical therapy goals. Pt was able to negotiate 4 stairs total today using 1 hand rail. Wife states there will be a handle to pull up on when pt returns home. Overall, good rehab effort today but pt fatigues quickly with OOB activity - especially stairs. Discussed home safety with pt and wife, and wife appears to have a good system in place to care for him at d/c.   Follow Up Recommendations  Home health PT;Supervision/Assistance - 24 hour     Does the patient have the potential to tolerate intense rehabilitation     Barriers to Discharge        Equipment Recommendations  Rolling walker with 5" wheels    Recommendations for Other Services    Frequency Min 3X/week   Progress towards PT Goals Progress towards PT goals: Progressing toward goals  Plan Current plan remains appropriate    Precautions / Restrictions Precautions Precautions: Fall Precaution Comments: Drain on Lt side of back Restrictions Weight Bearing Restrictions: No   Pertinent Vitals/Pain Pt reports minimal pain throughout session in abdomen and from drain site.    Mobility  Bed Mobility Bed Mobility: Supine to Sit;Sitting - Scoot to Edge of Bed Supine to Sit: 5: Supervision;HOB flat;With rails Sitting - Scoot to Edge of Bed: 5: Supervision Details for Bed Mobility Assistance: Pt required increased time to transition to EOB with heavy use of bed rails, however did not require any physical assist. Transfers Transfers: Sit to Stand;Stand to Sit Sit to Stand: 4: Min guard;From bed;From toilet;With upper extremity assist Stand to Sit: 4: Min guard;To  chair/3-in-1;To toilet Details for Transfer Assistance: VC's for hand placement and for controlled descent to chair. No assistance necessary. Ambulation/Gait Ambulation/Gait Assistance: 4: Min guard Ambulation Distance (Feet): 125 Feet Assistive device: Rolling walker Ambulation/Gait Assistance Details: With chair follow, seated rest breaks due to fatigue. VC's to take it slow and not rush, for energy conservation. Gait Pattern: Step-through pattern;Decreased stride length;Trunk flexed Gait velocity: Decreased Stairs: Yes Stairs Assistance: 4: Min guard Stairs Assistance Details (indicate cue type and reason): Pt does not have railings at home, however states he will have a grab bar next to the door to pull up on. Refused backwards stair training with walker due to decreased comfort level.  Stair Management Technique: One rail Right;Forwards Number of Stairs: 4 (2 steps x2)    Exercises     PT Diagnosis:    PT Problem List:   PT Treatment Interventions:     PT Goals (current goals can now be found in the care plan section) Acute Rehab PT Goals Patient Stated Goal: to go home  PT Goal Formulation: With patient Time For Goal Achievement: 09/11/13 Potential to Achieve Goals: Good  Visit Information  Last PT Received On: 09/03/13 Assistance Needed: +1 History of Present Illness: pt presents with perforated duodenal ulcer, sepsis, renal abscess, and hydronephrosis.      Subjective Data  Subjective: "My stamina is shot. I give out so quickly." Patient Stated Goal: to go home    Cognition  Cognition Arousal/Alertness: Awake/alert Behavior During Therapy: WFL for tasks assessed/performed Overall Cognitive Status: Within Functional Limits for tasks assessed  Balance     End of Session PT - End of Session Equipment Utilized During Treatment: Gait belt Activity Tolerance: Patient limited by fatigue Patient left: in chair;with call bell/phone within reach;with family/visitor  present Nurse Communication: Mobility status   GP     Ruthann Cancer 09/03/2013, 12:48 PM  Ruthann Cancer, PT, DPT [phone]

## 2013-09-03 NOTE — Progress Notes (Signed)
10 Days Post-Op  Subjective: Tolerating po Had BM yesterday comfortable  Objective: Vital signs in last 24 hours: Temp:  [97.3 F (36.3 C)-98.8 F (37.1 C)] 98.8 F (37.1 C) (12/09 0550) Pulse Rate:  [75-105] 88 (12/09 0550) Resp:  [18-20] 18 (12/09 0550) BP: (100-115)/(62-74) 115/65 mmHg (12/09 0550) SpO2:  [94 %-97 %] 97 % (12/09 0550) Last BM Date: 09/02/13  Intake/Output from previous day: 12/08 0701 - 12/09 0700 In: 500 [P.O.:480] Out: 1925 [Urine:1925] Intake/Output this shift:    Abdomen soft, open wound clean  Lab Results:   Recent Labs  09/01/13 0550 09/02/13 0710  WBC 11.3* 9.4  HGB 9.4* 9.0*  HCT 29.7* 27.3*  PLT 268 256   BMET  Recent Labs  09/01/13 0550 09/02/13 0710  NA 138 137  K 4.3 4.1  CL 111 110  CO2 19 21  GLUCOSE 107* 102*  BUN 19 17  CREATININE 1.56* 1.59*  CALCIUM 7.6* 7.7*   PT/INR No results found for this basename: LABPROT, INR,  in the last 72 hours ABG No results found for this basename: PHART, PCO2, PO2, HCO3,  in the last 72 hours  Studies/Results: Ct Abdomen Pelvis Wo Contrast  09/01/2013   CLINICAL DATA:  Kidney infection.  Perforated duodenal ulcer.  EXAM: CT ABDOMEN AND PELVIS WITHOUT CONTRAST  TECHNIQUE: Multidetector CT imaging of the abdomen and pelvis was performed following the standard protocol without intravenous contrast.  COMPARISON:  CT scan dated 08/24/2013  FINDINGS: There is a persistent small right pleural effusion with persistent atelectasis of much of the right lower lobe. Atelectasis at the left base has almost completely resolved.  The patient has undergone repair of a perforated duodenal ulcer. A drain is in place at that site. There are multiple small air bubbles in the soft tissues just above the duodenal bulb a but there is no pus collection.  There are few small stones in the neck of the gallbladder. No bile duct dilatation.  Ascites has almost resolved. There is residual fluid loculated along the  inferior medial aspect of the right lobe of the liver.  Numerous calcified granulomas in the otherwise normal-appearing spleen. Pancreas and adrenal glands are normal. Pigtail catheter is seen in the left renal pelvis. There is no hydronephrosis. There is a 15 mm stone in the proximal left ureter, unchanged.  There are multiple low-density lesions in both kidneys, unchanged since multiple prior studies.  There are numerous diverticula in the nondistended colon. Aorto bi-iliac stent graft is in place. There is a large amount of stool and barium in the rectum.  IMPRESSION: 1. There is small amount of residual in ascites loculated around the liver but most of the ascites has resolved. 2. There is no abscess around the site of the previous duodenal perforation. There are multiple small air bubbles just above the duodenum bulb. 3. Percutaneous stent remains in the proximal left renal collecting system and the 15 mm stone remains in the proximal left ureter. No residual left hydronephrosis.   Electronically Signed   By: Geanie Cooley M.D.   On: 09/01/2013 15:38    Anti-infectives: Anti-infectives   Start     Dose/Rate Route Frequency Ordered Stop   09/03/13 0600  ciprofloxacin (CIPRO) IVPB 400 mg    Comments:  Begin on call to IR   400 mg 200 mL/hr over 60 Minutes Intravenous On call 09/02/13 1105 09/04/13 0600   08/29/13 1230  imipenem-cilastatin (PRIMAXIN) 500 mg in sodium chloride 0.9 % 100 mL  IVPB     500 mg 200 mL/hr over 30 Minutes Intravenous Every 8 hours 08/29/13 0802     08/27/13 0200  imipenem-cilastatin (PRIMAXIN) 250 mg in sodium chloride 0.9 % 100 mL IVPB  Status:  Discontinued     250 mg 200 mL/hr over 30 Minutes Intravenous Every 6 hours 08/27/13 0051 08/29/13 0802   08/26/13 1400  vancomycin (VANCOCIN) 1,500 mg in sodium chloride 0.9 % 500 mL IVPB  Status:  Discontinued     1,500 mg 250 mL/hr over 120 Minutes Intravenous Every 48 hours 08/24/13 1301 08/24/13 2004   08/25/13 1800   imipenem-cilastatin (PRIMAXIN) 250 mg in sodium chloride 0.9 % 100 mL IVPB  Status:  Discontinued     250 mg 200 mL/hr over 30 Minutes Intravenous Every 12 hours 08/24/13 1245 08/27/13 0051   08/24/13 2345  fluconazole (DIFLUCAN) IVPB 200 mg  Status:  Discontinued     200 mg 100 mL/hr over 60 Minutes Intravenous Every 24 hours 08/24/13 2332 09/02/13 1613   08/24/13 1315  vancomycin (VANCOCIN) 2,250 mg in sodium chloride 0.9 % 500 mL IVPB  Status:  Discontinued     2,250 mg 250 mL/hr over 120 Minutes Intravenous  Once 08/24/13 1301 08/24/13 2004   08/20/13 1200  imipenem-cilastatin (PRIMAXIN) 250 mg in sodium chloride 0.9 % 100 mL IVPB  Status:  Discontinued     250 mg 200 mL/hr over 30 Minutes Intravenous 4 times per day 08/20/13 1132 08/24/13 1245   08/18/13 2200  ceFEPIme (MAXIPIME) 1 g in dextrose 5 % 50 mL IVPB  Status:  Discontinued     1 g 100 mL/hr over 30 Minutes Intravenous Every 12 hours 08/18/13 1846 08/18/13 1847   08/18/13 2000  ceFEPIme (MAXIPIME) 1 g in dextrose 5 % 50 mL IVPB  Status:  Discontinued     1 g 100 mL/hr over 30 Minutes Intravenous Every 24 hours 08/18/13 1728 08/18/13 1846   08/18/13 1900  ceFEPIme (MAXIPIME) 1 g in dextrose 5 % 50 mL IVPB  Status:  Discontinued     1 g 100 mL/hr over 30 Minutes Intravenous Every 24 hours 08/18/13 1847 08/20/13 1132      Assessment/Plan: s/p Procedure(s) with comments: EXPLORATORY LAPAROTOMY (N/A) - Gram patch closure  Patient stable from ulcer standpoint.  Ok the resume diet post IR procedure Home hopefully tomorrow on IV antibiotics  LOS: 16 days    Abdul Beirne A 09/03/2013

## 2013-09-04 DIAGNOSIS — R9431 Abnormal electrocardiogram [ECG] [EKG]: Secondary | ICD-10-CM

## 2013-09-04 MED ORDER — HEPARIN SOD (PORK) LOCK FLUSH 100 UNIT/ML IV SOLN
250.0000 [IU] | INTRAVENOUS | Status: AC | PRN
  Administered 2013-09-04: 250 [IU]

## 2013-09-04 NOTE — Progress Notes (Signed)
[facility]  Patient Status:   New pt this admission   [facility] is providing the following services:   HHRN and [facility] services for home IV ABX. [facility] in hospital coordinator provided pre hospital DC teaching of IV ABX to support wife independence at home. Will support planned dc home later today if deemed appropriate by MD.  If patient discharges after hours, please call [phone].   Sedalia Muta 09/04/2013, 10:46 AM

## 2013-09-04 NOTE — Progress Notes (Signed)
[facility] PROGRESS NOTE  Reis Goga ZOX:[id number] DOB: 13-Apr-1946 DOA: 08/18/2013 PCP: [name], MD  Assessment/Plan: Perforated duodenal ulcer  -x ray on 11/27 showed mild ileus- but CT abd/pelvis 11/29 revealed free air  - underwent EL/graham patch closure of perf DU  -Patient currently on regular diet, tolerating small amounts well.  -continue antibiotics  Severe sepsis  -had been resolving -likely secondary to urinary tract involvement vs perf duodenal ulcer  -Infectious disease following and recommends 10 days total of antibiotics (Imipenem and Diflucan),as well as 21 days of anbx from initial placement his renal drain on 11/23. End antibiotics on 09/08/2013  Hypernatremia/ Dehydration  -Resolved  Hydronephrosis, left/Renal abscess/1 cm ureteral calculus  -post percutaneous nephrostomy tube  -Spoke with urology, Dr. Vernie Ammons, who recommended internalizing with stent placement done by interventional radiology  -Repeat CT scan shows 15mm stone left proximal ureter  -Consulting IR for internalization of nephrostomy tube  Bacteremia due to ESBL Escherichia coli  -initially treated with Cefepime  -final culture called 11/25 from [facility] with E. Coli positive ESBL  -ID recommend change to Imipenem  -Repeat blood cultures have remained negative  Acute renal failure  -Creatinine trending downward 1.59, will continue to monitor  -Likely secondary to obstructive uropathy due to renal stone with hydronephrosis and infection  Acute respiratory failure with hypoxia  -due to sepsis, ARF and volume overload from prior resuscitative efforts  -stable on room air  PVD (peripheral vascular disease)  -Stable  Hypertension  -hold home meds for now  Anemia  -Likely secondary to surgery  -Will continue to monitor.  -No signs of active bleeding.   Code Status: Full Family Communication: Pt in room (indicate person spoken with, relationship, and if by phone, the  number) Disposition Plan: Pending   HPI/Subjective: Eager to go home  Objective: Filed Vitals:   09/03/13 1450 09/03/13 1543 09/03/13 2123 09/04/13 0538  BP: 107/72 124/77 123/62 120/68  Pulse: 100 88 105 109  Temp:  98.4 F (36.9 C) 98.9 F (37.2 C) 98.1 F (36.7 C)  TempSrc:  Oral Oral Oral  Resp: 19 18 17 16   Height:      Weight:      SpO2: 99% 98% 98% 94%    Intake/Output Summary (Last 24 hours) at 09/04/13 0844 Last data filed at 09/03/13 1400  Gross per 24 hour  Intake   3385 ml  Output    650 ml  Net   2735 ml   Filed Weights   08/18/13 1612 08/20/13 1955 08/24/13 2030  Weight: 110.1 kg (242 lb 11.6 oz) 110.859 kg (244 lb 6.4 oz) 108.41 kg (239 lb)    Exam:   General:  Awake, in nad  Cardiovascular: regular, s1, s2  Respiratory: normal resp effort, no wheezing  Abdomen: soft, nondistended  Musculoskeletal: perfused, no clubbing   Data Reviewed: Basic Metabolic Panel:  Recent Labs Lab 08/30/13 0820 08/31/13 1014 09/01/13 0550 09/02/13 0710 09/03/13 0800  NA 140 141 138 137 136  [place] 4.3 4.7 4.3 4.1 4.5  CL 116* 113* 111 110 107  CO2 13* 19 19 21 22   GLUCOSE 92 110* 107* 102* 97  BUN 31* 23 19 17 15   CREATININE 1.63* 1.63* 1.56* 1.59* 1.62*  CALCIUM 7.5* 7.7* 7.6* 7.7* 8.0*   Liver Function Tests: No results found for this basename: AST, ALT, ALKPHOS, BILITOT, PROT, ALBUMIN,  in the last 168 hours No results found for this basename: LIPASE, AMYLASE,  in the last 168 hours No  results found for this basename: AMMONIA,  in the last 168 hours CBC:  Recent Labs Lab 08/30/13 0820 09/01/13 0550 09/02/13 0710 09/03/13 0800  WBC 9.4 11.3* 9.4 14.0*  HGB 9.3* 9.4* 9.0* 9.2*  HCT 29.3* 29.7* 27.3* 29.3*  MCV 91.0 92.2 90.4 92.1  PLT 252 268 256 290   Cardiac Enzymes: No results found for this basename: CKTOTAL, CKMB, CKMBINDEX, TROPONINI,  in the last 168 hours BNP (last 3 results) No results found for this basename: PROBNP,  in the last  8760 hours CBG: No results found for this basename: GLUCAP,  in the last 168 hours  Recent Results (from the past 240 hour(s))  BODY FLUID CULTURE     Status: None   Collection Time    08/25/13  3:36 PM      Result Value Range Status   Specimen Description PERITONEAL CAVITY   Final   Special Requests Normal   Final   Gram Stain     Final   Value: ABUNDANT WBC PRESENT,BOTH PMN AND MONONUCLEAR     NO ORGANISMS SEEN     Performed at [facility]   Culture     Final   Value: NO GROWTH 3 DAYS     Performed at [facility]   Report Status 08/29/2013 FINAL   Final     Studies: Ir Nephrostomy Tube Change  09/03/2013   CLINICAL DATA:  History of left-sided ureteral stone, post percutaneous nephrostomy placement for pyelonephritis. Please attempt internalization of the nephrostomy access with placement of a double-J ureteral stent.  EXAM: 1. LEFT SIDED NEPHROSTOGRAM 2. LEFT SIDED URETERAL STENT PLACEMENT 3. LEFT SIDED PERCUTANEOUS NEPHROSTOMY CATHETER EXCHANGE  MEDICATIONS: the patient is currently admitted to the hospital in receiving intravenous antibiotics. The antibiotics were administered within an appropriate time frame prior to the initiation of the procedure.  ANESTHESIA/SEDATION: Total Moderate Sedation Time  51 minutes.  CONTRAST:  25mL OMNIPAQUE IOHEXOL 300 MG/ML  SOLN  COMPARISON:  Ultrasound fluoroscopic guided left-sided nephrostomy catheter placement -08/18/2013; CT abdomen pelvis -08/18/2013; 08/24/2013; 09/01/2013  FLUOROSCOPY TIME:  5 minutes; 6 seconds.  PROCEDURE: The procedure, risks, benefits, and alternatives were explained to the patient. Questions regarding the procedure were encouraged and answered. The patient understands and consents to the procedure.  The left-sided nephrostomy tube and surrounding skin were prepped with Betadine in a sterile fashion, and a sterile drape was applied covering the operative field. A sterile gown and sterile gloves were used for  the procedure. Local anesthesia was provided with 1% Lidocaine.  The preexisting left-sided nephrostomy tube was injected with contrast material. Fluoroscopic spot images were obtained of the collecting system and ureter to the level of the urinary bladder. The existing nephrostomy catheter was cut and cannulated with a Benson wire. Under intermittent fluoroscopic guidance, the existing nephrostomy catheter was exchanged for a 9-French vascular sheath which was advanced into the renal pelvis and utilized to advance an additional Benson wire into the renal collecting system to be used as a Museum/gallery conservator. The vascular sheath was exchanged for a Kumpe catheter which was manipulated to the level of the urinary bladder with the use of a Bentson wire.  The Kumpe catheter was utilized to estimate to appropriate length of the ureteral stent. An 8-French, 28 cm ureteral stent was then advanced over an Amplatz superstiff guidewire. The distal portion was formed at the level of the bladder. The proximal portion was then formed at the level of the renal collecting system.  At this point, the safety wire was utilized to replace an existing 10.2 [place] percutaneous nephrostomy catheter within the renal collecting system. Postprocedural images were obtained in various obliquities.  The nephrostomy catheter was capped and sutured in place. Dressings were placed. The patient tolerated procedure well without immediate postprocedural complication.  COMPLICATIONS: None immediate  FINDINGS: Left-sided nephrostogram demonstrates appropriate positioning and functioning of the existing nephrostomy catheter. There is a grossly unchanged partially occlusive stone within in the proximal aspect of the left ureter, unchanged in size and location compared to recently performed abdominal CT. No evidence of urinary obstruction.  A left-sided 8 French, 28 cm double-J ureteral stent was successful placed extending from the urinary bladder to the level  of the left renal collecting system.  Post stent placement images demonstrates passage of contrast through the double-J stents, however there is minimal retention of contrast within the left renal collecting system. As such, the decision was made to replace the left-sided 10.2 French nephrostomy catheter to maintain temporary percutaneous nephrostomy access.  IMPRESSION: 1. Successful placement of a left sided 28 cm ureteral stent. 2. Successful fluoroscopic guided exchange of a left-sided 10 French nephrostomy catheter to maintain temporary left-sided percutaneous nephrostomy access.  PLAN: The patient will undergo a trial of capping the left sided nephrostomy catheter to ensure adequate drainage through the recently placed double-J ureteral stent. The patient may return to interventional radiology later this week for repeat nephrostogram and potential nephrostomy catheter removal.   Electronically Signed   By: Simonne Come M.D.   On: 09/03/2013 16:10   Ir Nephrostogram Left  09/03/2013   CLINICAL DATA:  History of left-sided ureteral stone, post percutaneous nephrostomy placement for pyelonephritis. Please attempt internalization of the nephrostomy access with placement of a double-J ureteral stent.  EXAM: 1. LEFT SIDED NEPHROSTOGRAM 2. LEFT SIDED URETERAL STENT PLACEMENT 3. LEFT SIDED PERCUTANEOUS NEPHROSTOMY CATHETER EXCHANGE  MEDICATIONS: the patient is currently admitted to the hospital in receiving intravenous antibiotics. The antibiotics were administered within an appropriate time frame prior to the initiation of the procedure.  ANESTHESIA/SEDATION: Total Moderate Sedation Time  51 minutes.  CONTRAST:  25mL OMNIPAQUE IOHEXOL 300 MG/ML  SOLN  COMPARISON:  Ultrasound fluoroscopic guided left-sided nephrostomy catheter placement -08/18/2013; CT abdomen pelvis -08/18/2013; 08/24/2013; 09/01/2013  FLUOROSCOPY TIME:  5 minutes; 6 seconds.  PROCEDURE: The procedure, risks, benefits, and alternatives were  explained to the patient. Questions regarding the procedure were encouraged and answered. The patient understands and consents to the procedure.  The left-sided nephrostomy tube and surrounding skin were prepped with Betadine in a sterile fashion, and a sterile drape was applied covering the operative field. A sterile gown and sterile gloves were used for the procedure. Local anesthesia was provided with 1% Lidocaine.  The preexisting left-sided nephrostomy tube was injected with contrast material. Fluoroscopic spot images were obtained of the collecting system and ureter to the level of the urinary bladder. The existing nephrostomy catheter was cut and cannulated with a Benson wire. Under intermittent fluoroscopic guidance, the existing nephrostomy catheter was exchanged for a 9-French vascular sheath which was advanced into the renal pelvis and utilized to advance an additional Benson wire into the renal collecting system to be used as a Museum/gallery conservator. The vascular sheath was exchanged for a Kumpe catheter which was manipulated to the level of the urinary bladder with the use of a Bentson wire.  The Kumpe catheter was utilized to estimate to appropriate length of the ureteral stent. An 8-French, 28 cm  ureteral stent was then advanced over an Amplatz superstiff guidewire. The distal portion was formed at the level of the bladder. The proximal portion was then formed at the level of the renal collecting system.  At this point, the safety wire was utilized to replace an existing 10.2 [place] percutaneous nephrostomy catheter within the renal collecting system. Postprocedural images were obtained in various obliquities.  The nephrostomy catheter was capped and sutured in place. Dressings were placed. The patient tolerated procedure well without immediate postprocedural complication.  COMPLICATIONS: None immediate  FINDINGS: Left-sided nephrostogram demonstrates appropriate positioning and functioning of the existing  nephrostomy catheter. There is a grossly unchanged partially occlusive stone within in the proximal aspect of the left ureter, unchanged in size and location compared to recently performed abdominal CT. No evidence of urinary obstruction.  A left-sided 8 French, 28 cm double-J ureteral stent was successful placed extending from the urinary bladder to the level of the left renal collecting system.  Post stent placement images demonstrates passage of contrast through the double-J stents, however there is minimal retention of contrast within the left renal collecting system. As such, the decision was made to replace the left-sided 10.2 French nephrostomy catheter to maintain temporary percutaneous nephrostomy access.  IMPRESSION: 1. Successful placement of a left sided 28 cm ureteral stent. 2. Successful fluoroscopic guided exchange of a left-sided 10 French nephrostomy catheter to maintain temporary left-sided percutaneous nephrostomy access.  PLAN: The patient will undergo a trial of capping the left sided nephrostomy catheter to ensure adequate drainage through the recently placed double-J ureteral stent. The patient may return to interventional radiology later this week for repeat nephrostogram and potential nephrostomy catheter removal.   Electronically Signed   By: Simonne Come M.D.   On: 09/03/2013 16:10   Ir [place] Abts Cath Perc Left  09/03/2013   CLINICAL DATA:  History of left-sided ureteral stone, post percutaneous nephrostomy placement for pyelonephritis. Please attempt internalization of the nephrostomy access with placement of a double-J ureteral stent.  EXAM: 1. LEFT SIDED NEPHROSTOGRAM 2. LEFT SIDED URETERAL STENT PLACEMENT 3. LEFT SIDED PERCUTANEOUS NEPHROSTOMY CATHETER EXCHANGE  MEDICATIONS: the patient is currently admitted to the hospital in receiving intravenous antibiotics. The antibiotics were administered within an appropriate time frame prior to the initiation of the procedure.   ANESTHESIA/SEDATION: Total Moderate Sedation Time  51 minutes.  CONTRAST:  25mL OMNIPAQUE IOHEXOL 300 MG/ML  SOLN  COMPARISON:  Ultrasound fluoroscopic guided left-sided nephrostomy catheter placement -08/18/2013; CT abdomen pelvis -08/18/2013; 08/24/2013; 09/01/2013  FLUOROSCOPY TIME:  5 minutes; 6 seconds.  PROCEDURE: The procedure, risks, benefits, and alternatives were explained to the patient. Questions regarding the procedure were encouraged and answered. The patient understands and consents to the procedure.  The left-sided nephrostomy tube and surrounding skin were prepped with Betadine in a sterile fashion, and a sterile drape was applied covering the operative field. A sterile gown and sterile gloves were used for the procedure. Local anesthesia was provided with 1% Lidocaine.  The preexisting left-sided nephrostomy tube was injected with contrast material. Fluoroscopic spot images were obtained of the collecting system and ureter to the level of the urinary bladder. The existing nephrostomy catheter was cut and cannulated with a Benson wire. Under intermittent fluoroscopic guidance, the existing nephrostomy catheter was exchanged for a 9-French vascular sheath which was advanced into the renal pelvis and utilized to advance an additional Benson wire into the renal collecting system to be used as a Museum/gallery conservator. The vascular sheath was exchanged for  a Kumpe catheter which was manipulated to the level of the urinary bladder with the use of a Bentson wire.  The Kumpe catheter was utilized to estimate to appropriate length of the ureteral stent. An 8-French, 28 cm ureteral stent was then advanced over an Amplatz superstiff guidewire. The distal portion was formed at the level of the bladder. The proximal portion was then formed at the level of the renal collecting system.  At this point, the safety wire was utilized to replace an existing 10.2 [place] percutaneous nephrostomy catheter within the renal  collecting system. Postprocedural images were obtained in various obliquities.  The nephrostomy catheter was capped and sutured in place. Dressings were placed. The patient tolerated procedure well without immediate postprocedural complication.  COMPLICATIONS: None immediate  FINDINGS: Left-sided nephrostogram demonstrates appropriate positioning and functioning of the existing nephrostomy catheter. There is a grossly unchanged partially occlusive stone within in the proximal aspect of the left ureter, unchanged in size and location compared to recently performed abdominal CT. No evidence of urinary obstruction.  A left-sided 8 French, 28 cm double-J ureteral stent was successful placed extending from the urinary bladder to the level of the left renal collecting system.  Post stent placement images demonstrates passage of contrast through the double-J stents, however there is minimal retention of contrast within the left renal collecting system. As such, the decision was made to replace the left-sided 10.2 French nephrostomy catheter to maintain temporary percutaneous nephrostomy access.  IMPRESSION: 1. Successful placement of a left sided 28 cm ureteral stent. 2. Successful fluoroscopic guided exchange of a left-sided 10 French nephrostomy catheter to maintain temporary left-sided percutaneous nephrostomy access.  PLAN: The patient will undergo a trial of capping the left sided nephrostomy catheter to ensure adequate drainage through the recently placed double-J ureteral stent. The patient may return to interventional radiology later this week for repeat nephrostogram and potential nephrostomy catheter removal.   Electronically Signed   By: Simonne Come M.D.   On: 09/03/2013 16:10    Scheduled Meds: . antiseptic oral rinse  15 mL Mouth Rinse QID  . enoxaparin (LOVENOX) injection  40 mg Subcutaneous Daily  . imipenem-cilastatin  500 mg Intravenous Q8H  . pantoprazole  40 mg Oral Q12H   Continuous  Infusions: . dextrose 5 % and 0.45 % NaCl with KCl 20 mEq/L 75 mL/hr at 09/03/13 2347    Principal Problem:   Severe sepsis Active Problems:   PVD (peripheral vascular disease)   Hypertension   Acute renal failure   Hydronephrosis, left   Renal abscess   Acute respiratory failure with hypoxia   Dehydration   Bacteremia due to ESBL Escherichia coli    Time spent:    Nathan Rivera  [facility] Pager [phone]. If 7PM-7AM, please contact night-coverage at [email or web address], password [facility] 09/04/2013, 8:44 AM  LOS: 17 days

## 2013-09-11 ENCOUNTER — Ambulatory Visit (INDEPENDENT_AMBULATORY_CARE_PROVIDER_SITE_OTHER): Payer: Medicare Other | Admitting: Infectious Disease

## 2013-09-11 ENCOUNTER — Encounter: Payer: Self-pay | Admitting: Infectious Disease

## 2013-09-11 VITALS — BP 106/71 | HR 124 | Temp 98.3°F | Wt 214.0 lb

## 2013-09-11 DIAGNOSIS — Z23 Encounter for immunization: Secondary | ICD-10-CM

## 2013-09-11 DIAGNOSIS — IMO0002 Reserved for concepts with insufficient information to code with codable children: Secondary | ICD-10-CM

## 2013-09-11 DIAGNOSIS — T8383XA Hemorrhage of genitourinary prosthetic devices, implants and grafts, initial encounter: Secondary | ICD-10-CM

## 2013-09-11 DIAGNOSIS — B9689 Other specified bacterial agents as the cause of diseases classified elsewhere: Secondary | ICD-10-CM

## 2013-09-11 DIAGNOSIS — K265 Chronic or unspecified duodenal ulcer with perforation: Secondary | ICD-10-CM

## 2013-09-11 DIAGNOSIS — R58 Hemorrhage, not elsewhere classified: Secondary | ICD-10-CM

## 2013-09-11 DIAGNOSIS — N9989 Other postprocedural complications and disorders of genitourinary system: Secondary | ICD-10-CM

## 2013-09-11 DIAGNOSIS — A499 Bacterial infection, unspecified: Secondary | ICD-10-CM

## 2013-09-11 DIAGNOSIS — N151 Renal and perinephric abscess: Secondary | ICD-10-CM

## 2013-09-11 DIAGNOSIS — Z1619 Resistance to other specified beta lactam antibiotics: Secondary | ICD-10-CM

## 2013-09-11 DIAGNOSIS — Z87448 Personal history of other diseases of urinary system: Secondary | ICD-10-CM

## 2013-09-11 DIAGNOSIS — A419 Sepsis, unspecified organism: Secondary | ICD-10-CM

## 2013-09-11 NOTE — Progress Notes (Signed)
Subjective:    Patient ID: Nathan Rivera, male    DOB: 12-30-1945, 67 y.o.   MRN: [id number]  HPI  66 y.o. male. , history of bladder cancer, tobacco abuse, nephrolithiasis who was originally admitted on 08/17/13 at [facility]. Patient was apparently having issues with hematuria and UTI as an outpatient and was being treated with ciprofloxacin and Augmentin by his Urologist. Patient was lethargic on the day of admission and hypotensive with BP of 74/44. He was admitted to the ICU with septic shock and was found to have gram-negative sepsis --finally today ID'd as ESBL Ultrasound of the abdomen did not show any evidence for obstruction in the gallbladder area but was noted to have 1 cm stone with hydronephrosis. CT of the abdomen done today showed minimal cholelithiasis, severe left hydronephrosis secondary to 15 x 7 MM calculus in the middle portion of the left ureter. Urology was consulted and patient was seen by Dr. Yevonne Aline at [facility] who felt given patient's severe sepsis picture, significant leukocytosis, elevated creatinine and large proximal ureteral stone, he needed a nephrostomy tube. Patient was transferred to [facility] for emergentinterventional radiology consultation for nephrostomy tube. When tube placed it yielded frank pus --looked like "milky mustard"  Pt has stablized and has been trasnferred out of the ICU and stepdown. He has been on cefepime which should not be active vs the ESBL E colibut is dramatically better at present.  Today he was changed to imipenem.  His hospital stay was complicated by perforated duodenum found on 07/2913 sp EXPLORATORY LAPAROTOMY (N/A) - Gram patch closure  Omental patch closure perforated duodenal ulcer  Primary repair of umbilical hernia  He had 21 days of IV carbapenem post drain placment. He had ureteral stent placed.  Repeat CT scan done on 09/01/13 showed:  1. There is small amount of residual in ascites loculated around the    liver but most of the ascites has resolved.   2. There is no abscess around the site of the previous duodenal  perforation. There are multiple small air bubbles just above the  duodenum bulb.   3. Percutaneous stent remains in the proximal left renal collecting  system and the 15 mm stone remains in the proximal left ureter. No  residual left hydronephrosis.    He just finished his antibiotics on Sunday and is doing relatively well he has some discomfort where the nephrostomy tube still remains in place and is. Otherwise is doing well.    Review of Systems  Constitutional: Negative for fever, chills, diaphoresis, activity change, appetite change, fatigue and unexpected weight change.  HENT: Negative for congestion, rhinorrhea, sinus pressure, sneezing, sore throat and trouble swallowing.   Eyes: Negative for photophobia and visual disturbance.  Respiratory: Negative for cough, chest tightness, shortness of breath, wheezing and stridor.   Cardiovascular: Negative for chest pain, palpitations and leg swelling.  Gastrointestinal: Negative for nausea, vomiting, abdominal pain, diarrhea, constipation, blood in stool, abdominal distention and anal bleeding.  Genitourinary: Negative for dysuria, hematuria, flank pain and difficulty urinating.  Musculoskeletal: Negative for arthralgias, back pain, gait problem, joint swelling and myalgias.  Skin: Positive for wound. Negative for color change, pallor and rash.  Neurological: Negative for dizziness, tremors, weakness and light-headedness.  Hematological: Negative for adenopathy. Does not bruise/bleed easily.  Psychiatric/Behavioral: Negative for behavioral problems, confusion, sleep disturbance, dysphoric mood, decreased concentration and agitation.       Objective:   Physical Exam  Constitutional: He is oriented to person,  place, and time. He appears well-developed and well-nourished. No distress.  HENT:  Head: Normocephalic and  atraumatic.  Mouth/Throat: Oropharynx is clear and moist.  Eyes: Conjunctivae and EOM are normal. No scleral icterus.  Neck: Normal range of motion. Neck supple.  Cardiovascular: Normal rate and regular rhythm.   Pulmonary/Chest: Effort normal. No respiratory distress. He has no wheezes. He exhibits tenderness.  Abdominal: Soft. He exhibits no distension.  Musculoskeletal: He exhibits no edema and no tenderness.  Neurological: He is alert and oriented to person, place, and time. He has normal reflexes. He exhibits normal muscle tone. Coordination normal.  Skin: Skin is warm and dry. He is not diaphoretic.     Psychiatric: He has a normal mood and affect. His behavior is normal. Judgment and thought content normal.          Assessment & Plan:   Renal abscess and bacteremia with severe sepsis and septic shock due to ESBL: Sp 21 days of carbapenem post placement of drain. CT scan shows resolution of area or abscess was present.  Will discontinue his PICC line.  I've asked the patient and his wife to monitor him closely for any symptoms of recurrence.  The nephrostomy tubes should be removed.  I spent greater than 25 minutes with the patient including greater than 50% of time in face to face counsel of the patient and in coordination of their care.   Perforated duodenal ulcer to followup with surgery.  Need for influenza prophylaxis: Counsel him to receive flu vaccine but he refused.

## 2013-09-11 NOTE — Progress Notes (Signed)
RN received verbal order to discontinue the patient's PICC line.  Patient identified with name and date of birth. PICC dressing removed, site unremarkable.  PICC line removed using sterile procedure @ 1530. PICC length equal to that noted in patient's hospital chart of 42 cm. Sterile petroleum gauze + sterile 4X4 applied to PICC site, pressure applied for 10 minutes and covered with Medipore tape as a pressure dressing. Patient tolerated procedure without complaints.  Patient instructed to limit use of arm for 1 hour. Patient instructed that the pressure dressing should remain in place for 24 hours. Patient verbalized understanding of these instructions. 

## 2013-09-12 ENCOUNTER — Other Ambulatory Visit: Payer: Self-pay | Admitting: Urology

## 2013-09-13 ENCOUNTER — Encounter (HOSPITAL_COMMUNITY): Payer: Self-pay | Admitting: *Deleted

## 2013-09-16 ENCOUNTER — Encounter (HOSPITAL_COMMUNITY)
Admission: RE | Disposition: A | Discharge status: Home / Self Care | Payer: Self-pay | Source: Ambulatory Visit | Attending: Urology

## 2013-09-16 ENCOUNTER — Ambulatory Visit (HOSPITAL_COMMUNITY)
Admission: RE | Admit: 2013-09-16 | Discharge: 2013-09-16 | Disposition: A | Discharge status: Home / Self Care | Payer: Medicare Other | Source: Ambulatory Visit | Attending: Urology | Admitting: Urology

## 2013-09-16 ENCOUNTER — Encounter (HOSPITAL_COMMUNITY): Payer: Self-pay

## 2013-09-16 ENCOUNTER — Ambulatory Visit (HOSPITAL_COMMUNITY): Payer: Medicare Other

## 2013-09-16 DIAGNOSIS — I10 Essential (primary) hypertension: Secondary | ICD-10-CM | POA: Insufficient documentation

## 2013-09-16 DIAGNOSIS — N1330 Unspecified hydronephrosis: Secondary | ICD-10-CM | POA: Insufficient documentation

## 2013-09-16 DIAGNOSIS — N200 Calculus of kidney: Secondary | ICD-10-CM

## 2013-09-16 DIAGNOSIS — Z87440 Personal history of urinary (tract) infections: Secondary | ICD-10-CM | POA: Insufficient documentation

## 2013-09-16 DIAGNOSIS — N201 Calculus of ureter: Secondary | ICD-10-CM | POA: Insufficient documentation

## 2013-09-16 DIAGNOSIS — R319 Hematuria, unspecified: Secondary | ICD-10-CM | POA: Insufficient documentation

## 2013-09-16 DIAGNOSIS — C679 Malignant neoplasm of bladder, unspecified: Secondary | ICD-10-CM | POA: Insufficient documentation

## 2013-09-16 HISTORY — DX: Urinary tract infection, site not specified: N39.0

## 2013-09-16 SURGERY — LITHOTRIPSY, ESWL
Anesthesia: LOCAL | Laterality: Left

## 2013-09-16 MED ORDER — DIAZEPAM 5 MG PO TABS
10.0000 mg | ORAL_TABLET | ORAL | Status: AC
  Administered 2013-09-16: 10 mg via ORAL
  Filled 2013-09-16: qty 2

## 2013-09-16 MED ORDER — TAMSULOSIN HCL 0.4 MG PO CAPS: 0.4000 mg | ORAL_CAPSULE | Freq: Every day | ORAL | Status: DC

## 2013-09-16 MED ORDER — CIPROFLOXACIN HCL 500 MG PO TABS
500.0000 mg | ORAL_TABLET | ORAL | Status: AC
  Administered 2013-09-16: 500 mg via ORAL
  Filled 2013-09-16: qty 1

## 2013-09-16 MED ORDER — DIPHENHYDRAMINE HCL 25 MG PO CAPS
25.0000 mg | ORAL_CAPSULE | ORAL | Status: AC
  Administered 2013-09-16: 25 mg via ORAL
  Filled 2013-09-16: qty 1

## 2013-09-16 MED ORDER — SODIUM CHLORIDE 0.9 % IV SOLN
INTRAVENOUS | Status: DC
  Administered 2013-09-16: 10:00:00 via INTRAVENOUS

## 2013-09-16 NOTE — Op Note (Signed)
See [facility] OP note scanned into chart. 

## 2013-09-16 NOTE — H&P (Signed)
Reason For Visit Follow-up for left obstructing ureteral stone   History of Present Illness This is a 67 year old male who was recently hospitalized for Escherichia coli urosepsis. He was found to have a 13 mm stone in the proximal to mid left ureter. He ultimately had a nephrostomy tube placed and eventually an internal double-J stent placed through IR. The patient was initially admitted to the ICU for hypotension. His stay was complicated by a perforated duodenal ulcer, and he is now status post exploratory laparotomy and Midwife. The patient has an open abdomen currently and is being packed wet-to-dry.  The patient was discharged home approximately 10 days ago. He continued on IV antibiotics for extended beta lactamase resistant Escherichia coli for a total of 4 weeks. He had his PICC line removed yesterday. He currently has a double-J stent in the left ureter and a nephrostomy tube that is capped. This is his first kidney stone.    Patient relates that he had recurrent urinary tract infections since April of this year. He has been on intermittent treatment for them. He is not sure what his PVR is and has never had his prostate evaluated.     In addition the patient was diagnosed with bladder cancer one year ago and received 6 BCG treatments following surgery. This is managed by Dr. Irineo Axon MD   Review of Systems Genitourinary, constitutional, skin, eye, otolaryngeal, hematologic/lymphatic, cardiovascular, pulmonary, endocrine, musculoskeletal, gastrointestinal, neurological and psychiatric system(s) were reviewed and pertinent findings if present are noted.  Genitourinary: nocturia.  Gastrointestinal: nausea, vomiting and constipation.  Constitutional: fever and recent weight loss.  ENT: sore throat.  Cardiovascular: leg swelling.  Musculoskeletal: joint pain.    Vitals Vital Signs [Data Includes: Last 1 Day]  Recorded: 18Dec2014 02:39PM  Height: 5 ft 11 in Weight:  210 lb  BMI Calculated: 29.29 BSA Calculated: 2.15 Blood Pressure: 94 / 63 Temperature: 97.5 F Heart Rate: 132  Physical Exam Constitutional: Well nourished and well developed . No acute distress.  ENT:. The ears and nose are normal in appearance.  Neck: The appearance of the neck is normal and no neck mass is present.  Pulmonary: No respiratory distress, normal respiratory rhythm and effort and clear bilateral breath sounds.  Cardiovascular: Heart rate and rhythm are normal . No peripheral edema.  Abdomen: No CVA tenderness. Open abdominal wound which is dressed, I did not take the dressing down. Left nephrostomy tube capped and dressed there is no surrounding erythema or purulence.  Skin: Normal skin turgor, no visible rash and no visible skin lesions.  Neuro/Psych:. Mood and affect are appropriate.    Results/Data Urine [Data Includes: Last 1 Day]   18Dec2014  COLOR AMBER   APPEARANCE CLEAR   SPECIFIC GRAVITY 1.025   pH 6.0   GLUCOSE NEG mg/dL  BILIRUBIN SMALL   KETONE NEG mg/dL  BLOOD MOD   PROTEIN 30 mg/dL  UROBILINOGEN 2 mg/dL  NITRITE NEG   LEUKOCYTE ESTERASE SMALL   SQUAMOUS EPITHELIAL/HPF RARE   WBC 21-50 WBC/hpf  RBC 7-10 RBC/hpf  BACTERIA MODERATE   CRYSTALS NONE SEEN   CASTS NONE SEEN   Other MUCUS NOTED    Assessment Assessed  1. Left ureteral calculus (592.1) 2. Hematuria (599.70) 3. Recurrent urinary tract infection (599.0)  1. Left obstructing stone on status post left nephrostomy tube and double-J stent.  2. 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 Recurrent UTIs   Plan Health Maintenance  1. UA With  REFLEX; [Do Not Release]; Status:Complete;   Done: 18Dec2014 02:33PM Left ureteral calculus  2. Follow-up Schedule Surgery Office  Follow-up  Status: Complete  Done: 18Dec2014 3. KUB; Status:Resulted - Requires Verification;   Done: 18Dec2014 12:00AM 4. URINE CULTURE; Status:In Progress - Specimen/Data Collected;   Done:  18Dec2014  Discussion/Summary 1. I went over the treatment options for the patient mid ureteral stone. Since he has a nephrostomy tube we discussed percutaneous nephrolithotomy. We also discussed shockwave lithotripsy and ureteroscopy. He went over the risks and benefits of all the aforementioned procedures. The patient would like to proceed with shockwave lithotripsy. He understands that this may require several shocks, that this may be a staged procedure. I discussed his current circumstances with the [facility] lithotripsy coordinator and she does not think his open wound will be a contraindication to this procedure. We will get this scheduled ASAP.  2. The patient is being treated for this by Dr. Irineo Axon MD who is following him with surveillance cystoscopy.  3. We will discuss the patient's recurrent urinary tract infections that a follow-up visit.

## 2013-09-16 NOTE — Progress Notes (Signed)
Pt alert and oriented, vss, awake.  Pt drinking gingerale and eating crackers.  No nausea.  D/c instrusctions were given pre-lithotripsy.  Pt was able to void tea colored urine, moderate amount post lithotripsy.  Instructed pt and pt's wife on using strainer at home and putting fragments in specimen cup and taking to f/u appointment.  Pt voiced understanding.  Prescription for oxycodone was given to wife.  MD called flomax prescription in to his pharmacy in [place].  Wife is aware of this.  Pt states he is feeling good and is ready for d/c home.

## 2013-09-20 ENCOUNTER — Encounter (INDEPENDENT_AMBULATORY_CARE_PROVIDER_SITE_OTHER): Payer: Self-pay | Admitting: General Surgery

## 2013-09-20 ENCOUNTER — Ambulatory Visit (INDEPENDENT_AMBULATORY_CARE_PROVIDER_SITE_OTHER): Payer: Medicare Other | Admitting: General Surgery

## 2013-09-20 VITALS — BP 104/54 | HR 100 | Temp 99.1°F | Resp 15 | Ht 71.0 in | Wt 216.4 lb

## 2013-09-20 DIAGNOSIS — K255 Chronic or unspecified gastric ulcer with perforation: Secondary | ICD-10-CM | POA: Insufficient documentation

## 2013-09-20 NOTE — Patient Instructions (Signed)
No heavy lifting May shower Continue to change dressing twice a day

## 2013-09-20 NOTE — Progress Notes (Signed)
Subjective:     Patient ID: Nathan Rivera, male   DOB: December 24, 1945, 67 y.o.   MRN: [id number]  HPI The patient is a 67 year old white male who is about one month status post patch repair of a perforated gastric ulcer. He seems to be doing well. His main complaint is of the percutaneous nephrostomy tubes. He has minimal abdominal pain. He is eating 3-4 small meals a day. His bowels are moving regularly. His wife is doing his dressing changes to the midline abdominal wound and this seems to be healing well  Review of Systems     Objective:   Physical Exam On exam his abdomen is soft and nontender. His midline incision is healing reasonably well. He has a small amount of yellowish fibrinous exudate centrally but the rest of the wound was covered with good granulation tissue.    Assessment:     The patient is one month status post patch repair of a perforated gastric ulcer     Plan:     At this point I would like him to start showering every day and continuing to change the dressing twice a day. He is to avoid any heavy lifting. I will plan to see him back in one month to check his progress

## 2013-09-24 HISTORY — PX: OTHER SURGICAL HISTORY: SHX169

## 2013-09-26 DIAGNOSIS — I82432 Acute embolism and thrombosis of left popliteal vein: Secondary | ICD-10-CM

## 2013-09-26 HISTORY — DX: Acute embolism and thrombosis of left popliteal vein: I82.432

## 2013-10-25 ENCOUNTER — Ambulatory Visit (INDEPENDENT_AMBULATORY_CARE_PROVIDER_SITE_OTHER): Payer: Medicare Other | Admitting: General Surgery

## 2013-10-25 ENCOUNTER — Encounter (INDEPENDENT_AMBULATORY_CARE_PROVIDER_SITE_OTHER): Payer: Self-pay | Admitting: General Surgery

## 2013-10-25 ENCOUNTER — Encounter (INDEPENDENT_AMBULATORY_CARE_PROVIDER_SITE_OTHER): Payer: Self-pay

## 2013-10-25 VITALS — BP 108/72 | HR 90 | Resp 16 | Ht 71.0 in | Wt 221.2 lb

## 2013-10-25 DIAGNOSIS — K255 Chronic or unspecified gastric ulcer with perforation: Secondary | ICD-10-CM

## 2013-10-25 NOTE — Progress Notes (Signed)
Subjective:     Patient ID: Nathan Rivera, male   DOB: 07-08-1946, 68 y.o.   MRN: [id number]  HPI The patient is a 68 year old white male who is 2 months status post omental patch repair of a perforated gastric ulcer. He is doing much better. His appetite has improved and his bowels are moving normally. He is gained about 6 pounds in the last 3 weeks.  Review of Systems     Objective:   Physical Exam On exam the open wound is fairly clean. His is slightly smaller than was the last time we saw him. The knot from the stitch is exposed at the center portion of the incision. There is no tunneling.    Assessment:     The patient is 2 months status post repair of a perforated gastric ulcer     Plan:     At this point he may start increasing his activity. He will continue to shower daily and change the dressing twice a day. I will plan to see him back in one month to check the wound.

## 2013-10-25 NOTE — Patient Instructions (Signed)
May increase activity Continue to shower and change dressing twice a day

## 2013-11-05 ENCOUNTER — Ambulatory Visit: Payer: Self-pay | Admitting: Family Medicine

## 2013-11-25 ENCOUNTER — Ambulatory Visit (INDEPENDENT_AMBULATORY_CARE_PROVIDER_SITE_OTHER): Payer: Medicare Other | Admitting: General Surgery

## 2013-11-25 ENCOUNTER — Encounter (INDEPENDENT_AMBULATORY_CARE_PROVIDER_SITE_OTHER): Payer: Self-pay

## 2013-11-25 ENCOUNTER — Encounter (INDEPENDENT_AMBULATORY_CARE_PROVIDER_SITE_OTHER): Payer: Self-pay | Admitting: General Surgery

## 2013-11-25 VITALS — BP 122/80 | HR 90 | Temp 98.2°F | Resp 16 | Ht 71.0 in | Wt 222.0 lb

## 2013-11-25 DIAGNOSIS — K255 Chronic or unspecified gastric ulcer with perforation: Secondary | ICD-10-CM

## 2013-11-25 NOTE — Patient Instructions (Signed)
Follow up with medical doc about blood thinners See GI doc

## 2013-11-25 NOTE — Progress Notes (Signed)
Subjective:     Patient ID: Nathan Rivera, male   DOB: 30-Jan-1946, 68 y.o.   MRN: [id number]  HPI The patient is a 68 year old white male who is 3 months status post omental patch repair of a perforated gastric ulcer. He initially did very well. Just a couple weeks ago he developed some cramping in his right leg. He went to his medical doctor and an ultrasound was performed that showed a clot in the right leg. He is now on Eliquis. Since starting his blood thinner he has also had some bleeding issues. He has noticed some dark stools. He is being referred to a gastroenterologist. He has had no dizziness or lightheaded spells  Review of Systems  Constitutional: Negative.   HENT: Negative.   Eyes: Negative.   Respiratory: Negative.   Cardiovascular: Positive for leg swelling.  Gastrointestinal: Negative.   Endocrine: Negative.   Genitourinary: Negative.   Musculoskeletal: Negative.   Skin: Negative.   Allergic/Immunologic: Negative.   Neurological: Negative.   Hematological: Negative.   Psychiatric/Behavioral: Negative.        Objective:   Physical Exam  Constitutional: He is oriented to person, place, and time. He appears well-developed and well-nourished.  HENT:  Head: Normocephalic and atraumatic.  Eyes: Conjunctivae and EOM are normal. Pupils are equal, round, and reactive to light.  Neck: Normal range of motion. Neck supple.  Cardiovascular: Normal rate, regular rhythm and normal heart sounds.   Pulmonary/Chest: Effort normal and breath sounds normal.  Abdominal: Soft. Bowel sounds are normal.  The open abdominal wound continues to gradually shrink. The exposed knot was removed today by gently pulling on it. The wound was then repacked and he tolerated this well  Musculoskeletal: Normal range of motion.  Neurological: He is alert and oriented to person, place, and time.  Skin: Skin is warm and dry.  Psychiatric: He has a normal mood and affect. His behavior is normal.        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 He will also meet with his Gastroenterologist to consider upper endoscopy given his recent dark stools. Our plan to see him back in about 3 weeks to check his wound.

## 2014-01-31 ENCOUNTER — Ambulatory Visit (INDEPENDENT_AMBULATORY_CARE_PROVIDER_SITE_OTHER): Payer: Medicare Other | Admitting: General Surgery

## 2014-01-31 ENCOUNTER — Encounter (INDEPENDENT_AMBULATORY_CARE_PROVIDER_SITE_OTHER): Payer: Self-pay | Admitting: General Surgery

## 2014-01-31 VITALS — BP 128/84 | HR 76 | Resp 14 | Ht 71.0 in | Wt 227.6 lb

## 2014-01-31 DIAGNOSIS — K255 Chronic or unspecified gastric ulcer with perforation: Secondary | ICD-10-CM

## 2014-01-31 NOTE — Patient Instructions (Signed)
Continue to shower daily and change dressing

## 2014-02-11 ENCOUNTER — Encounter (INDEPENDENT_AMBULATORY_CARE_PROVIDER_SITE_OTHER): Payer: Self-pay | Admitting: General Surgery

## 2014-02-11 NOTE — Progress Notes (Signed)
Subjective:     Patient ID: Nathan Rivera, male   DOB: 1946/05/09, 68 y.o.   MRN: [id number]  HPI The patient is a 68 year old white male who is about 4 months status post omental patch repair of a perforated gastric ulcer. His postoperative course was complicated by development of a DVT. He was initially on Eliquis but had a GI bleed and was switched to Xarelto. He seems to be tolerating this better. His appetite is good and his bowels are working normally. He denies any abdominal pain.  Review of Systems     Objective:   Physical Exam On exam his abdomen is soft and nontender. His midline wound is clean with just a small open area along the midportion. He has a small amount of fibrinous exudate.     Assessment:     The patient is 4 months status post omental patch repair of a perforated gastric ulcer     Plan:     At this point he will continue to shower daily and do daily dressing changes. He will continue to take his blood thinner. I will plan to see him back in one month to check his progress

## 2014-02-24 ENCOUNTER — Inpatient Hospital Stay (HOSPITAL_COMMUNITY)
Admission: AD | Admit: 2014-02-24 | Discharge: 2014-02-27 | DRG: 728 | Disposition: A | Discharge status: Home Health | Payer: Medicare Other | Source: Ambulatory Visit | Attending: Urology | Admitting: Urology

## 2014-02-24 ENCOUNTER — Encounter (HOSPITAL_COMMUNITY): Payer: Self-pay | Admitting: *Deleted

## 2014-02-24 ENCOUNTER — Observation Stay (HOSPITAL_COMMUNITY): Payer: Medicare Other

## 2014-02-24 DIAGNOSIS — N179 Acute kidney failure, unspecified: Secondary | ICD-10-CM

## 2014-02-24 DIAGNOSIS — A499 Bacterial infection, unspecified: Secondary | ICD-10-CM

## 2014-02-24 DIAGNOSIS — Z1635 Resistance to multiple antimicrobial drugs: Secondary | ICD-10-CM

## 2014-02-24 DIAGNOSIS — N5089 Other specified disorders of the male genital organs: Secondary | ICD-10-CM | POA: Diagnosis present

## 2014-02-24 DIAGNOSIS — Z823 Family history of stroke: Secondary | ICD-10-CM

## 2014-02-24 DIAGNOSIS — F17200 Nicotine dependence, unspecified, uncomplicated: Secondary | ICD-10-CM | POA: Diagnosis present

## 2014-02-24 DIAGNOSIS — A498 Other bacterial infections of unspecified site: Secondary | ICD-10-CM | POA: Diagnosis present

## 2014-02-24 DIAGNOSIS — N183 Chronic kidney disease, stage 3 unspecified: Secondary | ICD-10-CM | POA: Diagnosis present

## 2014-02-24 DIAGNOSIS — R7881 Bacteremia: Secondary | ICD-10-CM

## 2014-02-24 DIAGNOSIS — Z1612 Extended spectrum beta lactamase (ESBL) resistance: Secondary | ICD-10-CM

## 2014-02-24 DIAGNOSIS — I129 Hypertensive chronic kidney disease with stage 1 through stage 4 chronic kidney disease, or unspecified chronic kidney disease: Secondary | ICD-10-CM | POA: Diagnosis present

## 2014-02-24 DIAGNOSIS — Z113 Encounter for screening for infections with a predominantly sexual mode of transmission: Secondary | ICD-10-CM

## 2014-02-24 DIAGNOSIS — Z7902 Long term (current) use of antithrombotics/antiplatelets: Secondary | ICD-10-CM

## 2014-02-24 DIAGNOSIS — N200 Calculus of kidney: Secondary | ICD-10-CM

## 2014-02-24 DIAGNOSIS — J9601 Acute respiratory failure with hypoxia: Secondary | ICD-10-CM

## 2014-02-24 DIAGNOSIS — N12 Tubulo-interstitial nephritis, not specified as acute or chronic: Secondary | ICD-10-CM | POA: Diagnosis present

## 2014-02-24 DIAGNOSIS — K255 Chronic or unspecified gastric ulcer with perforation: Secondary | ICD-10-CM

## 2014-02-24 DIAGNOSIS — Z86718 Personal history of other venous thrombosis and embolism: Secondary | ICD-10-CM

## 2014-02-24 DIAGNOSIS — IMO0002 Reserved for concepts with insufficient information to code with codable children: Secondary | ICD-10-CM | POA: Diagnosis present

## 2014-02-24 DIAGNOSIS — N453 Epididymo-orchitis: Principal | ICD-10-CM | POA: Diagnosis present

## 2014-02-24 DIAGNOSIS — N151 Renal and perinephric abscess: Secondary | ICD-10-CM

## 2014-02-24 DIAGNOSIS — N451 Epididymitis: Secondary | ICD-10-CM

## 2014-02-24 DIAGNOSIS — Z8551 Personal history of malignant neoplasm of bladder: Secondary | ICD-10-CM

## 2014-02-24 HISTORY — DX: Sepsis, unspecified organism: A41.9

## 2014-02-24 LAB — BASIC METABOLIC PANEL
BUN: 32 mg/dL — ABNORMAL HIGH (ref 6–23)
CO2: 22 mEq/L (ref 19–32)
Calcium: 9.3 mg/dL (ref 8.4–10.5)
Chloride: 103 mEq/L (ref 96–112)
Creatinine, Ser: 1.94 mg/dL — ABNORMAL HIGH (ref 0.50–1.35)
GFR calc non Af Amer: 34 mL/min — ABNORMAL LOW (ref >90)
GFR, EST AFRICAN AMERICAN: 39 mL/min — AB (ref >90)
Glucose, Bld: 104 mg/dL — ABNORMAL HIGH (ref 70–99)
POTASSIUM: 3.8 meq/L (ref 3.7–5.3)
SODIUM: 139 meq/L (ref 137–147)

## 2014-02-24 MED ORDER — SODIUM CHLORIDE 0.45 % IV SOLN
INTRAVENOUS | Status: DC
  Administered 2014-02-24 – 2014-02-26 (×4): via INTRAVENOUS

## 2014-02-24 MED ORDER — PANTOPRAZOLE SODIUM 40 MG PO TBEC
40.0000 mg | DELAYED_RELEASE_TABLET | Freq: Every day | ORAL | Status: DC
  Administered 2014-02-25 – 2014-02-27 (×3): 40 mg via ORAL
  Filled 2014-02-24 (×3): qty 1

## 2014-02-24 MED ORDER — ZOLPIDEM TARTRATE 5 MG PO TABS: 5.0000 mg | ORAL_TABLET | Freq: Every evening | ORAL | Status: DC | PRN

## 2014-02-24 MED ORDER — FERROUS FUMARATE 325 (106 FE) MG PO TABS
1.0000 | ORAL_TABLET | Freq: Every day | ORAL | Status: DC
  Administered 2014-02-25 – 2014-02-27 (×3): 106 mg via ORAL
  Filled 2014-02-24 (×4): qty 1

## 2014-02-24 MED ORDER — RIVAROXABAN 20 MG PO TABS
20.0000 mg | ORAL_TABLET | Freq: Every day | ORAL | Status: DC
  Filled 2014-02-24: qty 1

## 2014-02-24 MED ORDER — DOCUSATE SODIUM 100 MG PO CAPS
100.0000 mg | ORAL_CAPSULE | Freq: Two times a day (BID) | ORAL | Status: DC
  Administered 2014-02-24 – 2014-02-26 (×3): 100 mg via ORAL
  Filled 2014-02-24 (×7): qty 1

## 2014-02-24 MED ORDER — SIMVASTATIN 20 MG PO TABS
20.0000 mg | ORAL_TABLET | Freq: Every day | ORAL | Status: DC
  Administered 2014-02-24 – 2014-02-26 (×3): 20 mg via ORAL
  Filled 2014-02-24 (×4): qty 1

## 2014-02-24 MED ORDER — SODIUM CHLORIDE 0.9 % IV SOLN
500.0000 mg | Freq: Three times a day (TID) | INTRAVENOUS | Status: DC
  Administered 2014-02-24 – 2014-02-25 (×3): 500 mg via INTRAVENOUS
  Filled 2014-02-24 (×4): qty 500

## 2014-02-24 MED ORDER — SACCHAROMYCES BOULARDII 250 MG PO CAPS
250.0000 mg | ORAL_CAPSULE | Freq: Two times a day (BID) | ORAL | Status: DC
  Administered 2014-02-24 – 2014-02-27 (×6): 250 mg via ORAL
  Filled 2014-02-24 (×7): qty 1

## 2014-02-24 MED ORDER — RIVAROXABAN 20 MG PO TABS
20.0000 mg | ORAL_TABLET | Freq: Every day | ORAL | Status: DC
  Administered 2014-02-24 – 2014-02-26 (×3): 20 mg via ORAL
  Filled 2014-02-24 (×4): qty 1

## 2014-02-24 NOTE — H&P (Addendum)
History of present illness:  89M who developed gross hematuria ~7 days ago.  Since then he has had progressive left sided flank pain and new onset left testicular swelling.  He had a urine culture performed in our office which grew a multi-drug resistant e.coli sensitive to only nitrofurantoin orally.  Augmentin was intermediately resistant to it.  He was started on Augmentin on Friday, but over the weekend developed fevers of up to 103. The testicular pain has worsened. The flank pain has grown more frequent, and the patient has developed left lower quadrant abdominal pain. He denies any urinary frequency or urgency. He denies any dysuria. He denies any changes to his bowels.  The patient has a history of urosepsis secondary to an obstructing stone. His hospitalization was complicated by septic shock and subsequent duodenal ulcer/perforation. His stone was ultimately treated and removed in December 2014. He had been doing well up until this most recent episode of hematuria.  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   Review of systems: A 12 point comprehensive review of systems was obtained and is negative unless otherwise stated in the history of present illness.  Patient Active Problem List   Diagnosis Date Noted  . Pyelonephritis 02/24/2014  . Perforated gastric ulcer 09/20/2013  . Renal abscess 08/20/2013  . Acute respiratory failure with hypoxia 08/20/2013  . Dehydration 08/20/2013  . Bacteremia due to ESBL Escherichia coli 08/20/2013  . Severe sepsis 08/18/2013  . Acute renal failure 08/18/2013  . Hypotension, unspecified 08/18/2013  . Hydronephrosis, left 08/18/2013  . Nephrolithiasis 08/18/2013  . PVD (peripheral vascular disease) 08/29/2011  . Hypertension 08/29/2011  . Abnormal EKG 08/29/2011  . Preoperative cardiovascular examination 08/29/2011  . Hyperlipidemia 08/29/2011  . Malaise 08/29/2011    No current facility-administered  medications on file prior to encounter.   Current Outpatient Prescriptions on File Prior to Encounter  Medication Sig Dispense Refill  . CRANBERRY EXTRACT PO Take by mouth.      . ferrous fumarate (HEMOCYTE - 106 MG FE) 325 (106 FE) MG TABS tablet Take 1 tablet by mouth.      . pantoprazole (PROTONIX) 40 MG tablet       . simvastatin (ZOCOR) 20 MG tablet Take 20 mg by mouth at bedtime.        Alveda Reasons 20 MG TABS tablet         Past Medical History  Diagnosis Date  . Arthritis   . Degenerative disc disease   . Degenerative joint disease   . Aortic aneurysm   . Pneumonia   . Hypercholesterolemia   . Hypertension   . CKD (chronic kidney disease), stage III   . UTI (urinary tract infection) 08/17/13    Lt nephrostomy tube and stent placement  . Cancer     bladder  . Sepsis Nov. 2014    Past Surgical History  Procedure Laterality Date  . Abdominal aortic aneurysm repair  2011  . Hernia repair      93mo old  . Tonsillectomy    . Umbilical hernia repair      71 yr ago  . Laparotomy N/A 08/24/2013    Procedure: EXPLORATORY LAPAROTOMY;  Surgeon: Merrie Roof, MD;  Location: [facility];  Service: General;  Laterality: N/A;  Gram patch closure  . Nephrostomy  Nov. 2014  . Nephrostomy removal  Dec. 2014  . Litrotripsy  09/24/2013    History  Substance Use Topics  .  Smoking status: Current Every Day Smoker -- 1.00 packs/day for 40 years    Types: Cigarettes  . Smokeless tobacco: Never Used  . Alcohol Use: 1.2 oz/week    2 Glasses of wine per week    Family History  Problem Relation Age of Onset  . CVA Father   . Atrial fibrillation Sister     PE: Filed Vitals:   02/24/14 1816  BP: 127/80  Pulse: 98  Temp: 98.6 F (37 C)  TempSrc: Oral  Resp: 16  Height: 5\' 11"  (1.803 m)  Weight: 98.93 kg (218 lb 1.6 oz)  SpO2: 97%    Patient appears to be in no acute distress  patient is alert and oriented x3 Atraumatic normocephalic head No cervical or supraclavicular  lymphadenopathy appreciated No increased work of breathing, no audible wheezes/rhonchi Regular sinus rhythm/rate Abdomen is soft, nontender, nondistended, left CVA tenderness, left testicular swelling/hydrocele and is firm and tender to palpation with associated erythema. Lower extremities are symmetric without appreciable edema Grossly neurologically intact No identifiable skin lesions  No results found for this basename: WBC, HGB, HCT,  in the last 72 hours No results found for this basename: NA, K, CL, CO2, GLUCOSE, BUN, CREATININE, CALCIUM,  in the last 72 hours No results found for this basename: LABPT, INR,  in the last 72 hours No results found for this basename: LABURIN,  in the last 72 hours Results for orders placed during the hospital encounter of 08/18/13  MRSA PCR SCREENING     Status: None   Collection Time    08/18/13  4:18 PM      Result Value Ref Range Status   MRSA by PCR NEGATIVE  NEGATIVE Final   Comment:            The GeneXpert MRSA Assay (FDA     approved for NASAL specimens     only), is one component of a     comprehensive MRSA colonization     surveillance program. It is not     intended to diagnose MRSA     infection nor to guide or     monitor treatment for     MRSA infections.  URINE CULTURE     Status: None   Collection Time    08/18/13  5:13 PM      Result Value Ref Range Status   Specimen Description URINE, CLEAN CATCH   Final   Special Requests NONE   Final   Culture  Setup Time     Final   Value: 08/19/2013 00:11     Performed at [facility] Count     Final   Value: 70,000 COLONIES/ML     Performed at [facility]   Culture     Final   Value: ESCHERICHIA COLI     Note: Confirmed Extended Spectrum Beta-Lactamase Producer (ESBL) CEFOXITIN SENSITIVE <=4     Performed at [facility]   Report Status 08/21/2013 FINAL   Final   Organism ID, Bacteria ESCHERICHIA COLI   Final  CULTURE, ROUTINE-ABSCESS      Status: None   Collection Time    08/18/13  6:49 PM      Result Value Ref Range Status   Specimen Description ABSCESS LEFT KIDNEY   Final   Special Requests NONE   Final   Gram Stain     Final   Value: ABUNDANT WBC PRESENT,BOTH PMN AND MONONUCLEAR     NO SQUAMOUS EPITHELIAL  CELLS SEEN     MODERATE GRAM NEGATIVE RODS     Performed at [facility]   Culture     Final   Value: ABUNDANT ESCHERICHIA COLI     Note: Confirmed Extended Spectrum Beta-Lactamase Producer (ESBL) CRITICAL RESULT CALLED TO, READ BACK BY AND VERIFIED WITH: ALBERT R 08/21/13 AT 36 BY [facility]     Performed at [facility]   Report Status 08/21/2013 FINAL   Final   Organism ID, Bacteria ESCHERICHIA COLI   Final  ANAEROBIC CULTURE     Status: None   Collection Time    08/18/13  6:54 PM      Result Value Ref Range Status   Specimen Description ABSCESS LEFT KIDNEY   Final   Special Requests NONE   Final   Gram Stain     Final   Value: ABUNDANT WBC PRESENT,BOTH PMN AND MONONUCLEAR     NO SQUAMOUS EPITHELIAL CELLS SEEN     MODERATE GRAM NEGATIVE RODS     Performed at [facility]   Culture     Final   Value: NO ANAEROBES ISOLATED     Performed at [facility]   Report Status 08/23/2013 FINAL   Final  CULTURE, BLOOD (ROUTINE X 2)     Status: None   Collection Time    08/18/13  7:05 PM      Result Value Ref Range Status   Specimen Description BLOOD RIGHT HAND   Final   Special Requests BOTTLES DRAWN AEROBIC AND ANAEROBIC 10CC   Final   Culture  Setup Time     Final   Value: 08/19/2013 00:10     Performed at [facility]   Culture     Final   Value: NO GROWTH 5 DAYS     Performed at [facility]   Report Status 08/24/2013 FINAL   Final  CULTURE, BLOOD (ROUTINE X 2)     Status: None   Collection Time    08/18/13  9:45 PM      Result Value Ref Range Status   Specimen Description BLOOD LEFT HAND   Final   Special Requests BOTTLES DRAWN AEROBIC AND ANAEROBIC 10CC EA    Final   Culture  Setup Time     Final   Value: 08/19/2013 02:54     Performed at [facility]   Culture     Final   Value: NO GROWTH 5 DAYS     Performed at [facility]   Report Status 08/25/2013 FINAL   Final  CULTURE, BLOOD (ROUTINE X 2)     Status: None   Collection Time    08/20/13  4:40 PM      Result Value Ref Range Status   Specimen Description BLOOD LEFT ARM   Final   Special Requests BOTTLES DRAWN AEROBIC ONLY 3CC   Final   Culture  Setup Time     Final   Value: 08/21/2013 02:13     Performed at [facility]   Culture     Final   Value: NO GROWTH 5 DAYS     Performed at [facility]   Report Status 08/27/2013 FINAL   Final  CULTURE, BLOOD (ROUTINE X 2)     Status: None   Collection Time    08/20/13  4:55 PM      Result Value Ref Range Status   Specimen Description BLOOD RIGHT ARM   Final  Special Requests BOTTLES DRAWN AEROBIC ONLY 10CC   Final   Culture  Setup Time     Final   Value: 08/21/2013 02:13     Performed at [facility]   Culture     Final   Value: NO GROWTH 5 DAYS     Performed at [facility]   Report Status 08/27/2013 FINAL   Final  MRSA PCR SCREENING     Status: None   Collection Time    08/24/13  8:07 PM      Result Value Ref Range Status   MRSA by PCR NEGATIVE  NEGATIVE Final   Comment:            The GeneXpert MRSA Assay (FDA     approved for NASAL specimens     only), is one component of a     comprehensive MRSA colonization     surveillance program. It is not     intended to diagnose MRSA     infection nor to guide or     monitor treatment for     MRSA infections.  BODY FLUID CULTURE     Status: None   Collection Time    08/25/13  3:36 PM      Result Value Ref Range Status   Specimen Description PERITONEAL CAVITY   Final   Special Requests Normal   Final   Gram Stain     Final   Value: ABUNDANT WBC PRESENT,BOTH PMN AND MONONUCLEAR     NO ORGANISMS SEEN     Performed at [facility]   Culture     Final   Value: NO GROWTH 3 DAYS     Performed at [facility]   Report Status 08/29/2013 FINAL   Final    Imaging: none  Imp: Patient likely has left pyelonephritis in his left epididymoorchitis secondary to a multidrug-resistant Escherichia coli urinary tract infection.  The patient is hemodynamically stable and currently he is afebrile.  Plan: The plan is to treat the patient's infection with imipenem which is sensitive to the Escherichia coli. I ordered a renal ultrasound to ensure that the patient does not have hydronephrosis and obstruction of the left kidney. In addition, I ordered a left scrotal ultrasound to ensure the patient does not have an abscess. Would like to bladder scan the patient postvoid to ensure that he has not and urinary retention. I ordered a urine culture as well as labs today. Resume the patient's home medications including Xarelto, iron, simvastatin, and Protonix. I will obtain an ID consult tomorrow morning.   Ardis Hughs

## 2014-02-25 DIAGNOSIS — Z9889 Other specified postprocedural states: Secondary | ICD-10-CM

## 2014-02-25 DIAGNOSIS — B9689 Other specified bacterial agents as the cause of diseases classified elsewhere: Secondary | ICD-10-CM

## 2014-02-25 DIAGNOSIS — N453 Epididymo-orchitis: Principal | ICD-10-CM

## 2014-02-25 DIAGNOSIS — N12 Tubulo-interstitial nephritis, not specified as acute or chronic: Secondary | ICD-10-CM

## 2014-02-25 LAB — BASIC METABOLIC PANEL
BUN: 31 mg/dL — ABNORMAL HIGH (ref 6–23)
CALCIUM: 8.9 mg/dL (ref 8.4–10.5)
CHLORIDE: 103 meq/L (ref 96–112)
CO2: 22 mEq/L (ref 19–32)
CREATININE: 1.91 mg/dL — AB (ref 0.50–1.35)
GFR, EST AFRICAN AMERICAN: 40 mL/min — AB (ref >90)
GFR, EST NON AFRICAN AMERICAN: 34 mL/min — AB (ref >90)
Glucose, Bld: 106 mg/dL — ABNORMAL HIGH (ref 70–99)
Potassium: 3.8 mEq/L (ref 3.7–5.3)
Sodium: 138 mEq/L (ref 137–147)

## 2014-02-25 LAB — CBC
HCT: 40.7 % (ref 39.0–52.0)
Hemoglobin: 13.4 g/dL (ref 13.0–17.0)
MCH: 28.9 pg (ref 26.0–34.0)
MCHC: 32.9 g/dL (ref 30.0–36.0)
MCV: 87.7 fL (ref 78.0–100.0)
PLATELETS: 151 10*3/uL (ref 150–400)
RBC: 4.64 MIL/uL (ref 4.22–5.81)
RDW: 15.9 % — AB (ref 11.5–15.5)
WBC: 13.3 10*3/uL — AB (ref 4.0–10.5)

## 2014-02-25 LAB — URINE CULTURE
COLONY COUNT: NO GROWTH
CULTURE: NO GROWTH

## 2014-02-25 MED ORDER — ERTAPENEM SODIUM 1 G IJ SOLR
1.0000 g | INTRAMUSCULAR | Status: DC
  Administered 2014-02-25 – 2014-02-26 (×2): 1 g via INTRAMUSCULAR
  Filled 2014-02-25 (×3): qty 1

## 2014-02-25 MED ORDER — LIDOCAINE HCL 1 % IJ SOLN
INTRAMUSCULAR | Status: AC
  Administered 2014-02-25: 20 mL
  Filled 2014-02-25: qty 20

## 2014-02-25 MED ORDER — MUPIROCIN 2 % EX OINT
TOPICAL_OINTMENT | Freq: Two times a day (BID) | CUTANEOUS | Status: DC
  Administered 2014-02-25 – 2014-02-27 (×5): via NASAL
  Filled 2014-02-25: qty 22

## 2014-02-25 MED ORDER — CHLORHEXIDINE GLUCONATE CLOTH 2 % EX PADS
6.0000 | MEDICATED_PAD | Freq: Every day | CUTANEOUS | Status: DC
  Administered 2014-02-25 – 2014-02-27 (×3): 6 via TOPICAL

## 2014-02-25 NOTE — Progress Notes (Signed)
CSW received consult that patient requested information re: [place] [facility]. CSW provided packet for him & his wife along with CSW business card & encouraged patient to review and call when ready to complete.   Nathan Rivera, [facility] Clinical Social Worker cell #: [phone]

## 2014-02-25 NOTE — Progress Notes (Signed)
Urology Inpatient Progress Report  Intv/Subj: No acute events overnight. Patient Still complaining of left scrotal pain. Renal ultrasound was performed yesterday evening, there is no evidence of hydronephrosis or obstruction.  In addition, a scrotal ultrasound was obtained showing no evidence of left hemiscrotal abscess.  The patient does have the finding of epididymitis. Denies nausea or vomiting, denies shortness of breath.  Tolerating regular diet without issue.  Past Medical History  Diagnosis Date  . Arthritis   . Degenerative disc disease   . Degenerative joint disease   . Aortic aneurysm   . Pneumonia   . Hypercholesterolemia   . Hypertension   . CKD (chronic kidney disease), stage III   . UTI (urinary tract infection) 08/17/13    Lt nephrostomy tube and stent placement  . Cancer     bladder  . Sepsis Nov. 2014   Current Facility-Administered Medications  Medication Dose Route Frequency Provider Last Rate Last Dose  . 0.45 % sodium chloride infusion   Intravenous Continuous Ardis Hughs, MD 100 mL/hr at 02/25/14 0403    . Chlorhexidine Gluconate Cloth 2 % PADS 6 each  6 each Topical Q0600 Ardis Hughs, MD   6 each at 02/25/14 [phone]  . docusate sodium (COLACE) capsule 100 mg  100 mg Oral BID Ardis Hughs, MD   100 mg at 02/24/14 2143  . ferrous fumarate (HEMOCYTE - 106 mg FE) tablet 106 mg of iron  1 tablet Oral Daily Ardis Hughs, MD      . imipenem-cilastatin (PRIMAXIN) 500 mg in sodium chloride 0.9 % 100 mL IVPB  500 mg Intravenous 3 times per day Ardis Hughs, MD   500 mg at 02/25/14 4166  . mupirocin ointment (BACTROBAN) 2 %   Nasal BID Ardis Hughs, MD      . pantoprazole (PROTONIX) EC tablet 40 mg  40 mg Oral Daily Ardis Hughs, MD      . rivaroxaban Alveda Reasons) tablet 20 mg  20 mg Oral Q supper Ardis Hughs, MD   20 mg at 02/24/14 2143  . saccharomyces boulardii (FLORASTOR) capsule 250 mg  250 mg Oral BID Ardis Hughs,  MD   250 mg at 02/24/14 2143  . simvastatin (ZOCOR) tablet 20 mg  20 mg Oral QHS Ardis Hughs, MD   20 mg at 02/24/14 2143  . zolpidem (AMBIEN) tablet 5 mg  5 mg Oral QHS PRN Ardis Hughs, MD         Objective: Vital: Filed Vitals:   02/24/14 1816 02/24/14 2158  BP: 127/80 112/74  Pulse: 98 95  Temp: 98.6 F (37 C) 98.1 F (36.7 C)  TempSrc: Oral Oral  Resp: 16 18  Height: 5\' 11"  (1.803 m)   Weight: 98.93 kg (218 lb 1.6 oz)   SpO2: 97% 98%   I/Os: I/O last 3 completed shifts: In: 1351.7 [I.V.:1151.7; IV Piggyback:200] Out: 500 [Urine:500]  Physical Exam:  General: Patient is in no apparent distress Lungs: Normal respiratory effort, chest expands symmetrically. GI: The abdomen is soft and nontender without mass. GU: No change to the left hemiscrotal swelling and tenderness Ext: lower extremities symmetric  Lab Results:  Recent Labs  02/25/14 0357  WBC 13.3*  HGB 13.4  HCT 40.7    Recent Labs  02/24/14 2040 02/25/14 0357  NA 139 138  K 3.8 3.8  CL 103 103  CO2 22 22  GLUCOSE 104* 106*  BUN 32* 31*  CREATININE 1.94* 1.91*  CALCIUM 9.3 8.9   No results found for this basename: LABPT, INR,  in the last 72 hours No results found for this basename: LABURIN,  in the last 72 hours Results for orders placed during the hospital encounter of 08/18/13  MRSA PCR SCREENING     Status: None   Collection Time    08/18/13  4:18 PM      Result Value Ref Range Status   MRSA by PCR NEGATIVE  NEGATIVE Final   Comment:            The GeneXpert MRSA Assay (FDA     approved for NASAL specimens     only), is one component of a     comprehensive MRSA colonization     surveillance program. It is not     intended to diagnose MRSA     infection nor to guide or     monitor treatment for     MRSA infections.  URINE CULTURE     Status: None   Collection Time    08/18/13  5:13 PM      Result Value Ref Range Status   Specimen Description URINE, CLEAN CATCH    Final   Special Requests NONE   Final   Culture  Setup Time     Final   Value: 08/19/2013 00:11     Performed at [facility]     Final   Value: 70,000 COLONIES/ML     Performed at [facility]   Culture     Final   Value: ESCHERICHIA COLI     Note: Confirmed Extended Spectrum Beta-Lactamase Producer (ESBL) CEFOXITIN SENSITIVE <=4     Performed at [facility]   Report Status 08/21/2013 FINAL   Final   Organism ID, Bacteria ESCHERICHIA COLI   Final  CULTURE, ROUTINE-ABSCESS     Status: None   Collection Time    08/18/13  6:49 PM      Result Value Ref Range Status   Specimen Description ABSCESS LEFT KIDNEY   Final   Special Requests NONE   Final   Gram Stain     Final   Value: ABUNDANT WBC PRESENT,BOTH PMN AND MONONUCLEAR     NO SQUAMOUS EPITHELIAL CELLS SEEN     MODERATE GRAM NEGATIVE RODS     Performed at [facility]   Culture     Final   Value: ABUNDANT ESCHERICHIA COLI     Note: Confirmed Extended Spectrum Beta-Lactamase Producer (ESBL) CRITICAL RESULT CALLED TO, READ BACK BY AND VERIFIED WITH: ALBERT R 08/21/13 AT 71 BY [facility]     Performed at [facility]   Report Status 08/21/2013 FINAL   Final   Organism ID, Bacteria ESCHERICHIA COLI   Final  ANAEROBIC CULTURE     Status: None   Collection Time    08/18/13  6:54 PM      Result Value Ref Range Status   Specimen Description ABSCESS LEFT KIDNEY   Final   Special Requests NONE   Final   Gram Stain     Final   Value: ABUNDANT WBC PRESENT,BOTH PMN AND MONONUCLEAR     NO SQUAMOUS EPITHELIAL CELLS SEEN     MODERATE GRAM NEGATIVE RODS     Performed at [facility]   Culture     Final   Value: NO ANAEROBES ISOLATED     Performed at [facility]   Report Status 08/23/2013 FINAL  Final  CULTURE, BLOOD (ROUTINE X 2)     Status: None   Collection Time    08/18/13  7:05 PM      Result Value Ref Range Status   Specimen Description BLOOD RIGHT HAND    Final   Special Requests BOTTLES DRAWN AEROBIC AND ANAEROBIC 10CC   Final   Culture  Setup Time     Final   Value: 08/19/2013 00:10     Performed at [facility]   Culture     Final   Value: NO GROWTH 5 DAYS     Performed at [facility]   Report Status 08/24/2013 FINAL   Final  CULTURE, BLOOD (ROUTINE X 2)     Status: None   Collection Time    08/18/13  9:45 PM      Result Value Ref Range Status   Specimen Description BLOOD LEFT HAND   Final   Special Requests BOTTLES DRAWN AEROBIC AND ANAEROBIC 10CC EA   Final   Culture  Setup Time     Final   Value: 08/19/2013 02:54     Performed at [facility]   Culture     Final   Value: NO GROWTH 5 DAYS     Performed at [facility]   Report Status 08/25/2013 FINAL   Final  CULTURE, BLOOD (ROUTINE X 2)     Status: None   Collection Time    08/20/13  4:40 PM      Result Value Ref Range Status   Specimen Description BLOOD LEFT ARM   Final   Special Requests BOTTLES DRAWN AEROBIC ONLY 3CC   Final   Culture  Setup Time     Final   Value: 08/21/2013 02:13     Performed at [facility]   Culture     Final   Value: NO GROWTH 5 DAYS     Performed at [facility]   Report Status 08/27/2013 FINAL   Final  CULTURE, BLOOD (ROUTINE X 2)     Status: None   Collection Time    08/20/13  4:55 PM      Result Value Ref Range Status   Specimen Description BLOOD RIGHT ARM   Final   Special Requests BOTTLES DRAWN AEROBIC ONLY 10CC   Final   Culture  Setup Time     Final   Value: 08/21/2013 02:13     Performed at [facility]   Culture     Final   Value: NO GROWTH 5 DAYS     Performed at [facility]   Report Status 08/27/2013 FINAL   Final  MRSA PCR SCREENING     Status: None   Collection Time    08/24/13  8:07 PM      Result Value Ref Range Status   MRSA by PCR NEGATIVE  NEGATIVE Final   Comment:            The GeneXpert MRSA Assay (FDA     approved for NASAL specimens      only), is one component of a     comprehensive MRSA colonization     surveillance program. It is not     intended to diagnose MRSA     infection nor to guide or     monitor treatment for     MRSA infections.  BODY FLUID CULTURE     Status: None   Collection Time    08/25/13  3:36 PM      Result Value Ref Range Status   Specimen Description PERITONEAL CAVITY   Final   Special Requests Normal   Final   Gram Stain     Final   Value: ABUNDANT WBC PRESENT,BOTH PMN AND MONONUCLEAR     NO ORGANISMS SEEN     Performed at [facility]   Culture     Final   Value: NO GROWTH 3 DAYS     Performed at [facility]   Report Status 08/29/2013 FINAL   Final    Studies/Results: Renal ultrasound: IMPRESSION:  Cystic lesions in the right kidney under evaluated on today's exam  when compared with the prior CT examination.  No hydronephrosis is noted.  Scrotal ultrasound: IMPRESSION:  Left epididymitis with small complex hydrocele. Assessment:  Left-sided pyelonephritis and left epididymitis secondary to extended beta-lactamase multidrug resistant Escherichia coli culture from his urine.  There does not appear to be any evidence of systemic infection.  Plan: The patient was started on imipenem overnight, he'll continue this until otherwise directed by infectious disease -Appreciate their input and help. For the patient's epididymitis I recommended that the patient elevate his scrotum and apply ice and addition to the antibiotics. The patient also has poorly characterized lung nodules on his most recent CAT scan in March with the recommendation of interval repeat.  I will discuss with radiology whether it is too early to repeat his CAT scan potentially order that today as well. Suspect the patient will remain in hospital today as we get outpatient antibiotics set up for him.  Ardis Hughs 02/25/2014, 8:41 AM

## 2014-02-25 NOTE — Consult Note (Signed)
[facility]    Date of Admission:  02/24/2014  Date of Consult:  02/25/2014  Reason for Consult: ESBL severe pyelonephritis and epididymitis Referring Physician: Dr. Louis Meckel   HPI: Nathan Rivera is an 68 y.o. male, with hx of bladder cancer, nephrolithiasis known to me from when I met him this November when He was admitted to the ICU with septic shock and was found to have gram-negative sepsis due to  ESBL At that time he had , severe left hydronephrosis secondary to 15 x 7 MM calculus in the middle portion of the left ureter. Urology was consulted and patient was seen by Dr. Sharlette Dense at [facility] who felt given patient's severe sepsis picture, significant leukocytosis, elevated creatinine and large proximal ureteral stone, he needed a nephrostomy tube. Patient was transferred to [facility] for emergentinterventional radiology consultation for nephrostomy tube. When tube placed it yielded frank pus --looked like "milky mustard" which also grew same ESBL. We changed him to carbapenem therapy.  His hospital stay was complicated by perforated duodenum found on 07/2913 sp EXPLORATORY LAPAROTOMY (N/A) - Gram patch closure  Omental patch closure perforated duodenal ulcer  Primary repair of umbilical hernia   He had 21 days of IV carbapenem post drain placment. He had ureteral stent placed.   Repeat CT scan done on 09/01/13 showed:  1. There is small amount of residual in ascites loculated around the  liver but most of the ascites has resolved.  2. There is no abscess around the site of the previous duodenal  perforation. There are multiple small air bubbles just above the  duodenum bulb.  3. Percutaneous stent remains in the proximal left renal collecting  system and the 15 mm stone remains in the proximal left ureter. No  residual left hydronephrosis.   I saw him for the last time in my clinic on 09/11/14.  He has been followed closely by Dr Louis Meckel who has  repeatedly isolated ESBL from the patient's urine. Dr Louis Meckel has assured me that he has largely avoided trying to treat this bacteria as pt did not have symptoms. Pt has been stone free, but I DO worry that he may have stent still in place that could be nidus for his persistent ESBL colonization and now infection.  Towards the end of last week the patient developed hematuria and saw urology and cultures were obtained from the urine which is subtotally yielded extended spectrum beta-lactamase producing organism. He had been given Augmentin in the interim but had progression of his symptoms of dysuria hematuria back pain and then severe onset of left-sided scrotal and testicular pain along with fevers. He was admitted and placed on IV imipenem. He has new urine cultures obtained form June 1st but they have not finalized yet. Blood cultures have also been obtained  He complains that his surgical wound from his abdomen is still not healing and he is having trouble sneezing and having the wound opened back up again.  He is quite unhappy at the prospect of having to be on IV antibiotics for a few weeks again.    Past Medical History  Diagnosis Date  . Arthritis   . Degenerative disc disease   . Degenerative joint disease   . Aortic aneurysm   . Pneumonia   . Hypercholesterolemia   . Hypertension   . CKD (chronic kidney disease), stage III   . UTI (urinary tract infection) 08/17/13    Lt nephrostomy tube and stent  placement  . Cancer     bladder  . Sepsis Nov. 2014    Past Surgical History  Procedure Laterality Date  . Abdominal aortic aneurysm repair  2011  . Hernia repair      45moold  . Tonsillectomy    . Umbilical hernia repair      529yr ago  . Laparotomy N/A 08/24/2013    Procedure: EXPLORATORY LAPAROTOMY;  Surgeon: PMerrie Roof MD;  Location: [facility]  Service: General;  Laterality: N/A;  Gram patch closure  . Nephrostomy  Nov. 2014  . Nephrostomy removal  Dec. 2014  .  Litrotripsy  09/24/2013  ergies:   No Known Allergies   Medications: I have reviewed patients current medications as documented in Epic Anti-infectives   Start     Dose/Rate Route Frequency Ordered Stop   02/25/14 1700  ertapenem ([facility] injection 1 g     1 g Intramuscular Every 24 hours 02/25/14 1554     02/24/14 2200  imipenem-cilastatin (PRIMAXIN) 500 mg in sodium chloride 0.9 % 100 mL IVPB  Status:  Discontinued     500 mg 200 mL/hr over 30 Minutes Intravenous 3 times per day 02/24/14 1811 02/25/14 1554      Social History:  reports that he has been smoking Cigarettes.  He has a 40 pack-year smoking history. He has never used smokeless tobacco. He reports that he drinks about 1.2 ounces of alcohol per week. He reports that he does not use illicit drugs.  Family History  Problem Relation Age of Onset  . CVA Father   . Atrial fibrillation Sister     As in HPI and primary teams notes otherwise 12 point review of systems is negative  Blood pressure 133/81, pulse 97, temperature 98.7 F (37.1 C), temperature source Oral, resp. rate 18, height 5' 11" (1.803 m), weight 218 lb 1.6 oz (98.93 kg), SpO2 97.00%. General: Alert and awake, oriented x3, not in any acute distress. HEENT: anicteric sclera, , EOMI, oropharynx clear and without exudate CVS regular rate, normal r,  no murmur rubs or gallops Chest: clear to auscultation bilaterally, no wheezing, rales or rhonchi Abdomen: soft , nondistended, normal bowel sounds,  Wound with 2 openings see below    GU: swollen tender left testicle      Extremities: no  clubbing or edema noted bilaterally  Neuro: nonfocal, strength and sensation intact   Results for orders placed during the hospital encounter of 02/24/14 (from the past 48 hour(s))  BASIC METABOLIC PANEL     Status: Abnormal   Collection Time    02/24/14  8:40 PM      Result Value Ref Range   Sodium 139  137 - 147 mEq/L   Potassium 3.8  3.7 - 5.3 mEq/L    Chloride 103  96 - 112 mEq/L   CO2 22  19 - 32 mEq/L   Glucose, Bld 104 (*) 70 - 99 mg/dL   BUN 32 (*) 6 - 23 mg/dL   Creatinine, Ser 1.94 (*) 0.50 - 1.35 mg/dL   Calcium 9.3  8.4 - 10.5 mg/dL   GFR calc non Af Amer 34 (*) >90 mL/min   GFR calc Af Amer 39 (*) >90 mL/min   Comment: (NOTE)     The eGFR has been calculated using the CKD EPI equation.     This calculation has not been validated in all clinical situations.     eGFR's persistently <90 mL/min signify possible Chronic Kidney  Disease.  BASIC METABOLIC PANEL     Status: Abnormal   Collection Time    02/25/14  3:57 AM      Result Value Ref Range   Sodium 138  137 - 147 mEq/L   Potassium 3.8  3.7 - 5.3 mEq/L   Chloride 103  96 - 112 mEq/L   CO2 22  19 - 32 mEq/L   Glucose, Bld 106 (*) 70 - 99 mg/dL   BUN 31 (*) 6 - 23 mg/dL   Creatinine, Ser 1.91 (*) 0.50 - 1.35 mg/dL   Calcium 8.9  8.4 - 10.5 mg/dL   GFR calc non Af Amer 34 (*) >90 mL/min   GFR calc Af Amer 40 (*) >90 mL/min   Comment: (NOTE)     The eGFR has been calculated using the CKD EPI equation.     This calculation has not been validated in all clinical situations.     eGFR's persistently <90 mL/min signify possible Chronic Kidney     Disease.  CBC     Status: Abnormal   Collection Time    02/25/14  3:57 AM      Result Value Ref Range   WBC 13.3 (*) 4.0 - 10.5 K/uL   RBC 4.64  4.22 - 5.81 MIL/uL   Hemoglobin 13.4  13.0 - 17.0 g/dL   HCT 40.7  39.0 - 52.0 %   MCV 87.7  78.0 - 100.0 fL   MCH 28.9  26.0 - 34.0 pg   MCHC 32.9  30.0 - 36.0 g/dL   RDW 15.9 (*) 11.5 - 15.5 %   Platelets 151  150 - 400 K/uL      Component Value Date/Time   SDES PERITONEAL CAVITY 08/25/2013 1536   SPECREQUEST Normal 08/25/2013 1536   CULT  Value: NO GROWTH 3 DAYS Performed at [facility] 08/25/2013 1536   REPTSTATUS 08/29/2013 FINAL 08/25/2013 1536   US Scrotum  02/25/2014   CLINICAL DATA:  Left testicular pain.  Rule out abscess.  EXAM: ULTRASOUND OF SCROTUM   TECHNIQUE: Complete ultrasound examination of the testicles, epididymis, and other scrotal structures was performed.  COMPARISON:  None.  FINDINGS: Right testicle  Measurements: 4.2 x 2.5 x 2.8 cm. There is a peripheral mediastinal 5 mm cyst. No evidence of solid mass.  Left testicle  Measurements: 4.5 x 3.7 x 3.9 cm. No mass or microlithiasis visualized.  Right epididymis:  Normal in size and appearance.  Left epididymis: Enlarged relative to the contralateral epididymis, over twice the thickness. There is diffuse hypervascularity. No evidence of abscess.  Hydrocele: Small hydrocele on the left, with thick septations. Small, simple hydrocele on the right.  Varicocele:  None visualized.  IMPRESSION: Left epididymitis with small complex hydrocele.   Electronically Signed   By: Jorje Guild M.D.   On: 02/25/2014 05:53   US Renal  02/25/2014   CLINICAL DATA:  Pyelonephritis  EXAM: RENAL/URINARY TRACT ULTRASOUND COMPLETE  COMPARISON:  10/10/2013  FINDINGS: Right Kidney:  Length: 11.0 cm. A 2.1 cm hypoechoic lesion is noted in the midportion of the right kidney similar to that seen on prior CT examination. The other cystic lesions seen on prior CT are not well appreciated on this study. No hydronephrosis is noted.  Left Kidney:  Length: 11.4 cm. Echogenicity within normal limits. No mass or hydronephrosis visualized.  Bladder:  Decompressed  IMPRESSION: Cystic lesions in the right kidney under evaluated on today's exam when compared with the prior CT examination.  No  hydronephrosis is noted.   Electronically Signed   By: Inez Catalina M.D.   On: 02/25/2014 08:23     No results found for this or any previous visit (from the past 720 hour(s)).   Impression/Recommendation  Active Problems:   Pyelonephritis   Nathan Rivera is a 68 y.o. male with  Prior ESBL bacteremia and  septic shock due to obstructive stone with pyonephrosis sp 21 days of IV carbapenem, with ureteral stent placed who has had persistent  colonization with this ESBL and now severe infection that is ascended and also likely is causing his epididymitis.  #1 ESBL complicated pyelonephritis with epididymitis:  I have some anxiety that his ureteral stent may be a persistent nidus for his ESBL colonization and now infection. He claims he has not had much in way of antibiotics recently although he had a heavy antibiotic exposure last year prior to the onset of his ESBL septic shock. I'll discuss this further with urology in the morning.  For now I'll change him to IV Invanz 1 g daily  Will plan on placing a central line tomorrow. I discussed with him the fact that we might not want to place a PICC line given the possibility of long term he might need dialysis. Zomig is completely against the idea of hemodialysis and that that should not be a reason for not placing a PICC line. He himself was not enthusiastic about a PICC line due to the fact it would be in his arms and that could get in the way of his "golf game.  I discussed other possibility would be placement of a central line in his neck his jugular vein.  For now given the result of 1 culture that is still intubating from admission and given the remote possibility that culture would yield a different organism than ESBL along on placement of a central line for him until tomorrow.  We need clarification from urology re ? Should take out or exchange the ureteral stent in place in case it is nidus of his persistent eSBL  Regardless I would plan on giving him 2-4 weeks of IV carbapenem therapy to treat his complicated infection and hopefully resolve his epididymitis as well. Fosfomycin is not an option for this patient given that only asked to the level of the bladder.  I spent greater than 40 minutes with the patient including greater than 50% of time in face to face counsel of the patient and in coordination of their care.    02/25/2014, 5:23 PM   Thank you so much for this  interesting consult  [place] for [facility] [phone] (pager) [phone] (office) 02/25/2014, 5:23 PM  [place] 02/25/2014, 5:23 PM

## 2014-02-25 NOTE — Progress Notes (Signed)
UR completed. Patient changed to inpatient- requiring IV antibiotics and IVF

## 2014-02-26 LAB — BASIC METABOLIC PANEL
BUN: 31 mg/dL — AB (ref 6–23)
CHLORIDE: 106 meq/L (ref 96–112)
CO2: 21 mEq/L (ref 19–32)
CREATININE: 1.85 mg/dL — AB (ref 0.50–1.35)
Calcium: 8.9 mg/dL (ref 8.4–10.5)
GFR calc Af Amer: 41 mL/min — ABNORMAL LOW (ref >90)
GFR calc non Af Amer: 36 mL/min — ABNORMAL LOW (ref >90)
Glucose, Bld: 109 mg/dL — ABNORMAL HIGH (ref 70–99)
Potassium: 4 mEq/L (ref 3.7–5.3)
Sodium: 138 mEq/L (ref 137–147)

## 2014-02-26 LAB — HEPATITIS PANEL, ACUTE
HCV Ab: NEGATIVE
HEP B C IGM: NONREACTIVE
HEP B S AG: NEGATIVE
Hep A IgM: NONREACTIVE

## 2014-02-26 LAB — HIV ANTIBODY (ROUTINE TESTING W REFLEX): HIV 1&2 Ab, 4th Generation: NONREACTIVE

## 2014-02-26 MED ORDER — LIDOCAINE HCL 1 % IJ SOLN
INTRAMUSCULAR | Status: AC
  Administered 2014-02-26: 3.2 mL
  Filled 2014-02-26: qty 20

## 2014-02-26 MED ORDER — SODIUM CHLORIDE 0.9 % IJ SOLN
10.0000 mL | INTRAMUSCULAR | Status: DC | PRN
  Administered 2014-02-27: 10 mL

## 2014-02-26 NOTE — Progress Notes (Signed)
Urology Inpatient Progress Report  Intv/Subj: No acute events overnight. Switched to ertapenem yesterday, discussion of how to infuse abx as outpatient   Past Medical History  Diagnosis Date  . Arthritis   . Degenerative disc disease   . Degenerative joint disease   . Aortic aneurysm   . Pneumonia   . Hypercholesterolemia   . Hypertension   . CKD (chronic kidney disease), stage III   . UTI (urinary tract infection) 08/17/13    Lt nephrostomy tube and stent placement  . Cancer     bladder  . Sepsis Nov. 2014   Current Facility-Administered Medications  Medication Dose Route Frequency Provider Last Rate Last Dose  . Chlorhexidine Gluconate Cloth 2 % PADS 6 each  6 each Topical Q0600 Ardis Hughs, MD   6 each at 02/26/14 [phone]  . docusate sodium (COLACE) capsule 100 mg  100 mg Oral BID Ardis Hughs, MD   100 mg at 02/25/14 1013  . ertapenem [facility]) injection 1 g  1 g Intramuscular Q24H Truman Hayward, MD   1 g at 02/25/14 1711  . ferrous fumarate (HEMOCYTE - 106 mg FE) tablet 106 mg of iron  1 tablet Oral Daily Ardis Hughs, MD   106 mg of iron at 02/25/14 1014  . mupirocin ointment (BACTROBAN) 2 %   Nasal BID Ardis Hughs, MD      . pantoprazole (PROTONIX) EC tablet 40 mg  40 mg Oral Daily Ardis Hughs, MD   40 mg at 02/25/14 1014  . rivaroxaban (XARELTO) tablet 20 mg  20 mg Oral Q supper Ardis Hughs, MD   20 mg at 02/25/14 2100  . saccharomyces boulardii (FLORASTOR) capsule 250 mg  250 mg Oral BID Ardis Hughs, MD   250 mg at 02/25/14 2102  . simvastatin (ZOCOR) tablet 20 mg  20 mg Oral QHS Ardis Hughs, MD   20 mg at 02/25/14 2102  . zolpidem (AMBIEN) tablet 5 mg  5 mg Oral QHS PRN Ardis Hughs, MD         Objective: Vital: Filed Vitals:   02/24/14 2158 02/25/14 1347 02/25/14 2105 02/26/14 0624  BP: 112/74 133/81 132/73 135/80  Pulse: 95 97 96 80  Temp: 98.1 F (36.7 C) 98.7 F (37.1 C) 98.6 F (37 C) 98.2 F  (36.8 C)  TempSrc: Oral Oral Oral Oral  Resp: 18 18 18 16   Height:      Weight:      SpO2: 98% 97% 95% 99%   I/Os: I/O last 3 completed shifts: In: 4091.7 [P.O.:240; I.V.:3551.7; IV Piggyback:300] Out: 1900 [Urine:1900]  Physical Exam:  General: Patient is in no apparent distress Lungs: Normal respiratory effort, chest expands symmetrically. GI: The abdomen is soft and nontender without mass. GU: No change to the left hemiscrotal swelling and tenderness Ext: lower extremities symmetric  Lab Results:  Recent Labs  02/25/14 0357  WBC 13.3*  HGB 13.4  HCT 40.7    Recent Labs  02/24/14 2040 02/25/14 0357 02/26/14 0327  NA 139 138 138  K 3.8 3.8 4.0  CL 103 103 106  CO2 22 22 21   GLUCOSE 104* 106* 109*  BUN 32* 31* 31*  CREATININE 1.94* 1.91* 1.85*  CALCIUM 9.3 8.9 8.9   No results found for this basename: LABPT, INR,  in the last 72 hours No results found for this basename: LABURIN,  in the last 72 hours Results for orders placed during the hospital  encounter of 02/24/14  URINE CULTURE     Status: None   Collection Time    02/24/14  7:36 PM      Result Value Ref Range Status   Specimen Description URINE, RANDOM   Final   Special Requests NONE   Final   Culture  Setup Time     Final   Value: 02/24/2014 22:03     Performed at [facility]     Final   Value: NO GROWTH     Performed at [facility]   Culture     Final   Value: NO GROWTH     Performed at [facility]   Report Status 02/25/2014 FINAL   Final    Studies/Results: Renal ultrasound: IMPRESSION:  Cystic lesions in the right kidney under evaluated on today's exam  when compared with the prior CT examination.  No hydronephrosis is noted.  Scrotal ultrasound: IMPRESSION:  Left epididymitis with small complex hydrocele. Assessment:  Left-sided pyelonephritis and left epididymitis secondary to extended beta-lactamase multidrug resistant Escherichia coli  culture from his urine.  Improving. Urine culture obtained here was NG, likely due to Augmentin he was receiving prior to admission There is no ureteral stent left, his nephrostomy tube was removed in January 2015 after repeat CT scan showed no further stones within ureter.  Plan: Will wait on CT chest per discussion with radiologist Defer abx and mode of infusion to ID and patient, agree w 3-4 week course, appreciate their input Expect discharge tomorrow once PICC (if so chosen)  has been placed and Abx have set up at home  Please call with questions [place] 02/26/2014, 7:49 AM

## 2014-02-26 NOTE — Progress Notes (Signed)
[facility]  Patient Status: new pt this admission for [facility]  [facility] is providing the following services:   HHRN and [facility] for home IV ABX. [place] team will follow and support DC home when deemed appropriate by MD.  If patient discharges after hours, please call [phone].   Larry Sierras 02/26/2014, 6:01 PM

## 2014-02-26 NOTE — Care Management Note (Addendum)
    Page 1 of 1   02/27/2014     11:12:17 AM CARE MANAGEMENT NOTE 02/27/2014  Patient:  Nathan Rivera, Nathan Rivera   Account Number:  [id number]  Date Initiated:  02/26/2014  Documentation initiated by:  Dessa Phi  Subjective/Objective Assessment:   68 Y/O M ADMITTED W/PYELONEPHRITIS.     Action/Plan:   FROM HOME.   Anticipated DC Date:  02/27/2014   Anticipated DC Plan:  [place]  CM consult      Choice offered to / List presented to:  C-1 Patient        [place] arranged  HH-1 RN  IV Antibiotics      [place].   Status of service:  Completed, signed off Medicare Important Message given?  YES (If response is "NO", the following Medicare IM given date fields will be blank) Date Medicare IM given:  02/27/2014 Date Additional Medicare IM given:    Discharge Disposition:  [place]  Per UR Regulation:  Reviewed for med. necessity/level of care/duration of stay  If discussed at [place] of Stay Meetings, dates discussed:    Comments:  02/27/14 Diahann Guajardo RN,BSN NCM [phone] 11:15A-SPOKE TO [facility] IV ABX REP-PAM CHANDLER-PATIENT/SPOUSE HAS ALREADY BEEN INFORMED OF HOME VISIT BY [facility] TOMORROW.NURSE CAN HOLD ONTO SCRIPT TO GIVE TO [facility] REP [place] WILL FAX TO [facility].NO FURTHER D/C NEEDS.  [facility] AWARE OF D/C & HOME IV ABX ORDER.NO FURTHER D/C NEEDS.  02/26/14 Doloros Kwolek RN,BSN NCM [phone] FOR PICC TODAY.HOME W/IV ABX.[facility] CHOSEN FOR HH,TC KRISTEN [facility] REP AWARE OF REFERRAL.PLEASE PUT IN HHRN ORDER-IV ABX,PICC FLUSH.

## 2014-02-26 NOTE — Progress Notes (Signed)
[place] for Infectious Disease  Day # 2 carbapenem therapy   Subjective:    Antibiotics:  Anti-infectives   Start     Dose/Rate Route Frequency Ordered Stop   02/25/14 1700  ertapenem [facility]) injection 1 g     1 g Intramuscular Every 24 hours 02/25/14 1554     02/24/14 2200  imipenem-cilastatin (PRIMAXIN) 500 mg in sodium chloride 0.9 % 100 mL IVPB  Status:  Discontinued     500 mg 200 mL/hr over 30 Minutes Intravenous 3 times per day 02/24/14 1811 02/25/14 1554      Medications: Scheduled Meds: . Chlorhexidine Gluconate Cloth  6 each Topical Q0600  . docusate sodium  100 mg Oral BID  . ertapenem  1 g Intramuscular Q24H  . ferrous fumarate  1 tablet Oral Daily  . mupirocin ointment   Nasal BID  . pantoprazole  40 mg Oral Daily  . rivaroxaban  20 mg Oral Q supper  . saccharomyces boulardii  250 mg Oral BID  . simvastatin  20 mg Oral QHS   Continuous Infusions:  PRN Meds:.zolpidem    Objective: Weight change:   Intake/Output Summary (Last 24 hours) at 02/26/14 1701 Last data filed at 02/26/14 1519  Gross per 24 hour  Intake 1921.67 ml  Output   1100 ml  Net 821.67 ml   Blood pressure 126/73, pulse 77, temperature 98.3 F (36.8 C), temperature source Oral, resp. rate 19, height 5\' 11"  (1.803 m), weight 218 lb 1.6 oz (98.93 kg), SpO2 98.00%. Temp:  [98.2 F (36.8 C)-98.6 F (37 C)] 98.3 F (36.8 C) (06/03 1518) Pulse Rate:  [77-96] 77 (06/03 1518) Resp:  [16-19] 19 (06/03 1518) BP: (126-135)/(73-80) 126/73 mmHg (06/03 1518) SpO2:  [95 %-99 %] 98 % (06/03 1518)  Physical Exam: General: Alert and awake, oriented x3, not in any acute distress.  HEENT: anicteric sclera, , EOMI, oropharynx clear and without exudate  CVS regular rate, normal r, no murmur rubs or gallops  Chest: clear to auscultation bilaterally, no wheezing, rales or rhonchi  Abdomen: soft , nondistended, normal bowel sounds,  GU: still with scrotal edema Neuro:  nonfocal  CBC:  Recent Labs Lab 02/25/14 0357  HGB 13.4  HCT 40.7  PLT 151     BMET  Recent Labs  02/25/14 0357 02/26/14 0327  NA 138 138  K 3.8 4.0  CL 103 106  CO2 22 21  GLUCOSE 106* 109*  BUN 31* 31*  CREATININE 1.91* 1.85*  CALCIUM 8.9 8.9     Liver Panel    No results found for this basename: PROT, ALBUMIN, AST, ALT, ALKPHOS, BILITOT, BILIDIR, IBILI,  in the last 72 hours     Sedimentation Rate No results found for this basename: ESRSEDRATE,  in the last 72 hours C-Reactive Protein No results found for this basename: CRP,  in the last 72 hours  Micro Results: Recent Results (from the past 240 hour(s))  URINE CULTURE     Status: None   Collection Time    02/24/14  7:36 PM      Result Value Ref Range Status   Specimen Description URINE, RANDOM   Final   Special Requests NONE   Final   Culture  Setup Time     Final   Value: 02/24/2014 22:03     Performed at [place]     Final   Value: NO GROWTH     Performed at [facility]   Culture     Final   Value: NO GROWTH     Performed at [facility]   Report Status 02/25/2014 FINAL   Final    Studies/Results: US Scrotum  02/25/2014   CLINICAL DATA:  Left testicular pain.  Rule out abscess.  EXAM: ULTRASOUND OF SCROTUM  TECHNIQUE: Complete ultrasound examination of the testicles, epididymis, and other scrotal structures was performed.  COMPARISON:  None.  FINDINGS: Right testicle  Measurements: 4.2 x 2.5 x 2.8 cm. There is a peripheral mediastinal 5 mm cyst. No evidence of solid mass.  Left testicle  Measurements: 4.5 x 3.7 x 3.9 cm. No mass or microlithiasis visualized.  Right epididymis:  Normal in size and appearance.  Left epididymis: Enlarged relative to the contralateral epididymis, over twice the thickness. There is diffuse hypervascularity. No evidence of abscess.  Hydrocele: Small hydrocele on the left, with thick septations. Small, simple hydrocele on the  right.  Varicocele:  None visualized.  IMPRESSION: Left epididymitis with small complex hydrocele.   Electronically Signed   By: Jorje Guild M.D.   On: 02/25/2014 05:53   US Renal  02/25/2014   CLINICAL DATA:  Pyelonephritis  EXAM: RENAL/URINARY TRACT ULTRASOUND COMPLETE  COMPARISON:  10/10/2013  FINDINGS: Right Kidney:  Length: 11.0 cm. A 2.1 cm hypoechoic lesion is noted in the midportion of the right kidney similar to that seen on prior CT examination. The other cystic lesions seen on prior CT are not well appreciated on this study. No hydronephrosis is noted.  Left Kidney:  Length: 11.4 cm. Echogenicity within normal limits. No mass or hydronephrosis visualized.  Bladder:  Decompressed  IMPRESSION: Cystic lesions in the right kidney under evaluated on today's exam when compared with the prior CT examination.  No hydronephrosis is noted.   Electronically Signed   By: Inez Catalina M.D.   On: 02/25/2014 08:23      Assessment/Plan:  Active Problems:   Pyelonephritis    Nathan Rivera is a 68 y.o. male with  68 y.o. male with Prior ESBL bacteremia and septic shock due to obstructive stone with pyonephrosis sp 21 days of IV carbapenem, with ureteral stent placed who has had persistent colonization with this ESBL and now severe infection that is ascended and also likely is causing his epididymitis.   #1 ESBL complicated pyelonephritis with epididymitis: Appreciate Dr. Louis Meckel clarifying no stent in place anymore. Urine culture on admission was NGrowth on augmentin  Sensis from ESBL from office:     Despite fact that urine sterilized with augmentin I would not have sufficient faith trying to treat his condition with this abx for ESBL  --will give him at least 2 weeks of IV invanz (he doesn't want to go much longer with IV) and then consider extending further with augmentin or fosfomycin though doesn't make sense to treat an ESBL with beta lactam. Sulbactam component of Unasyn the  betalactamase decoy can have activity vs some of these organisms Fosfomycin only acts at level of the urine. Perhaps we can convince him to go longer with IV but he has travel plans with wife etc  --I am ordering PICC line adn will arrange HSFU in my clinic prior to stoppoing IV abx  --check GC and chlamydia in urine  #2 STD screen: HIV negative, hep panel negative           LOS: 2 days   Truman Hayward 02/26/2014, 5:01 PM

## 2014-02-26 NOTE — Progress Notes (Signed)
Peripherally Inserted Central Catheter/Midline Placement  The IV Nurse has discussed with the patient and/or persons authorized to consent for the patient, the purpose of this procedure and the potential benefits and risks involved with this procedure.  The benefits include less needle sticks, lab draws from the catheter and patient may be discharged home with the catheter.  Risks include, but not limited to, infection, bleeding, blood clot (thrombus formation), and puncture of an artery; nerve damage and irregular heat beat.  Alternatives to this procedure were also discussed.  PICC/Midline Placement Documentation  PICC / Midline Single Lumen 54/56/25 PICC Right Basilic 42 cm 0 cm (Active)     PICC / Midline Single Lumen 63/89/37 PICC Left Basilic 54 cm 0 cm (Active)       Nathan Rivera 02/26/2014, 8:58 PM

## 2014-02-27 ENCOUNTER — Telehealth: Payer: Self-pay | Admitting: *Deleted

## 2014-02-27 MED ORDER — SODIUM CHLORIDE 0.9 % IV SOLN
1.0000 g | INTRAVENOUS | Status: DC
  Administered 2014-02-27: 1 g via INTRAVENOUS
  Filled 2014-02-27: qty 1

## 2014-02-27 MED ORDER — SACCHAROMYCES BOULARDII 250 MG PO CAPS: 250.0000 mg | ORAL_CAPSULE | Freq: Two times a day (BID) | ORAL | Status: DC

## 2014-02-27 MED ORDER — SODIUM CHLORIDE 0.9 % IV SOLN: 1.0000 g | INTRAVENOUS | Status: DC

## 2014-02-27 MED ORDER — HEPARIN SOD (PORK) LOCK FLUSH 100 UNIT/ML IV SOLN
250.0000 [IU] | INTRAVENOUS | Status: AC | PRN
  Administered 2014-02-27: 250 [IU]

## 2014-02-27 MED ORDER — ERTAPENEM SODIUM 1 G IJ SOLR: 1.0000 g | INTRAMUSCULAR | Status: DC

## 2014-02-27 NOTE — Progress Notes (Signed)
Discharge to home, wife at bedside,d/c instructions and follow up appointments done and discussed with the patient, verbalized understanding. Last dose of IV abx given prior to discharged per MD order.  Pt discharged with PICC line, capped by IV Rn. IV abx prescription was given to [facility].

## 2014-02-27 NOTE — Progress Notes (Signed)
Urology Inpatient Progress Report  Intv/Subj: PICC placed Home health being worked out No acute events   Past Medical History  Diagnosis Date  . Arthritis   . Degenerative disc disease   . Degenerative joint disease   . Aortic aneurysm   . Pneumonia   . Hypercholesterolemia   . Hypertension   . CKD (chronic kidney disease), stage III   . UTI (urinary tract infection) 08/17/13    Lt nephrostomy tube and stent placement  . Cancer     bladder  . Sepsis Nov. 2014   Current Facility-Administered Medications  Medication Dose Route Frequency Provider Last Rate Last Dose  . Chlorhexidine Gluconate Cloth 2 % PADS 6 each  6 each Topical Q0600 Ardis Hughs, MD   6 each at 02/27/14 0600  . docusate sodium (COLACE) capsule 100 mg  100 mg Oral BID Ardis Hughs, MD   100 mg at 02/26/14 1018  . ertapenem [facility]) injection 1 g  1 g Intramuscular Q24H Truman Hayward, MD   1 g at 02/26/14 1711  . ferrous fumarate (HEMOCYTE - 106 mg FE) tablet 106 mg of iron  1 tablet Oral Daily Ardis Hughs, MD   106 mg of iron at 02/26/14 1018  . mupirocin ointment (BACTROBAN) 2 %   Nasal BID Ardis Hughs, MD      . pantoprazole (PROTONIX) EC tablet 40 mg  40 mg Oral Daily Ardis Hughs, MD   40 mg at 02/26/14 1018  . rivaroxaban (XARELTO) tablet 20 mg  20 mg Oral Q supper Ardis Hughs, MD   20 mg at 02/26/14 1711  . saccharomyces boulardii (FLORASTOR) capsule 250 mg  250 mg Oral BID Ardis Hughs, MD   250 mg at 02/26/14 2049  . simvastatin (ZOCOR) tablet 20 mg  20 mg Oral QHS Ardis Hughs, MD   20 mg at 02/26/14 2049  . sodium chloride 0.9 % injection 10-40 mL  10-40 mL Intracatheter PRN Ardis Hughs, MD      . zolpidem [facility]) tablet 5 mg  5 mg Oral QHS PRN Ardis Hughs, MD         Objective: Vital: Filed Vitals:   02/26/14 1443 02/26/14 1518 02/26/14 2230 02/27/14 0514  BP: 135/80 126/73 132/81 123/77  Pulse: 80 77 71 72  Temp: 98.2 F  (36.8 C) 98.3 F (36.8 C) 98.1 F (36.7 C) 98 F (36.7 C)  TempSrc: Oral Oral Oral Oral  Resp: 16 19 16 16   Height:      Weight:      SpO2: 99% 98% 99% 97%   I/Os: I/O last 3 completed shifts: In: 1540 [P.O.:600; I.V.:1210] Out: 1670 [Urine:1670]  Physical Exam:  General: Patient is in no apparent distress Lungs: Normal respiratory effort, chest expands symmetrically. GI: The abdomen is soft and nontender without mass. GU: No change to the left hemiscrotal swelling and tenderness Ext: lower extremities symmetric  Lab Results:  Recent Labs  02/25/14 0357  WBC 13.3*  HGB 13.4  HCT 40.7    Recent Labs  02/24/14 2040 02/25/14 0357 02/26/14 0327  NA 139 138 138  K 3.8 3.8 4.0  CL 103 103 106  CO2 22 22 21   GLUCOSE 104* 106* 109*  BUN 32* 31* 31*  CREATININE 1.94* 1.91* 1.85*  CALCIUM 9.3 8.9 8.9   No results found for this basename: LABPT, INR,  in the last 72 hours No results found for this basename:  LABURIN,  in the last 72 hours Results for orders placed during the hospital encounter of 02/24/14  URINE CULTURE     Status: None   Collection Time    02/24/14  7:36 PM      Result Value Ref Range Status   Specimen Description URINE, RANDOM   Final   Special Requests NONE   Final   Culture  Setup Time     Final   Value: 02/24/2014 22:03     Performed at [place]     Final   Value: NO GROWTH     Performed at [facility]   Culture     Final   Value: NO GROWTH     Performed at [facility]   Report Status 02/25/2014 FINAL   Final    Studies/Results: Renal ultrasound: IMPRESSION:  Cystic lesions in the right kidney under evaluated on today's exam  when compared with the prior CT examination.  No hydronephrosis is noted.  Scrotal ultrasound: IMPRESSION:  Left epididymitis with small complex hydrocele. Assessment:  Left-sided pyelonephritis and left epididymitis secondary to extended beta-lactamase multidrug  resistant Escherichia coli culture from his urine.  Improving. Urine culture obtained here was NG, likely due to Augmentin he was receiving prior to admission   Plan: D/c home this AM after abx infusion Home health for daily abx Probiotic while on abx F/u with me as scheduled in July - will defer to Dr. Tommy Medal regarding duration of abx. Please call with questions [place] 02/27/2014, 8:10 AM

## 2014-02-27 NOTE — Telephone Encounter (Signed)
[employer] approved Invanz.  [facility] notified by [facility].

## 2014-02-27 NOTE — Telephone Encounter (Signed)
THIS IS NOT RESPONSIBILTIY OF [facility], I AM GOING TO CC JAMIE RE THIS AND PERHAPS WE NEED TO FIND CASE MANager involved. Is this a CM not doing inpatient job or confusion by home infusion co?

## 2014-02-27 NOTE — Discharge Instructions (Signed)
Antibiotics are to be infused once daily. Take probiotic while on antibiotics. PICC line should be flushed daily. Call [phone] if you spike a fever >101 F, your pain worsens, or you are not feeling better.

## 2014-02-27 NOTE — Telephone Encounter (Signed)
Prior Authorization for [employer].  Pt waiting for d/c from [facility].  This RN was informed that this process could have been started in the hospital by the Case Manager when it was known the pt would be discharged on IV medication.  Completed PA for Invanz over the phone.  Requested expedited approval so the pt to could be discharged from the hospital.  [employer] stated that approval should happen before 5 PM this afternoon.  Will call this office with decision.

## 2014-02-27 NOTE — Discharge Summary (Signed)
Date of admission: 02/24/2014  Date of discharge: 02/27/2014  Admission diagnosis: ESBL ecoli pyelonephritis, epididymitis  Discharge diagnosis: same  Secondary diagnoses:  Patient Active Problem List   Diagnosis Date Noted  . Pyelonephritis 02/24/2014  . Perforated gastric ulcer 09/20/2013  . Renal abscess 08/20/2013  . Acute respiratory failure with hypoxia 08/20/2013  . Dehydration 08/20/2013  . Bacteremia due to ESBL Escherichia coli 08/20/2013  . Severe sepsis 08/18/2013  . Acute renal failure 08/18/2013  . Hypotension, unspecified 08/18/2013  . Hydronephrosis, left 08/18/2013  . Nephrolithiasis 08/18/2013  . PVD (peripheral vascular disease) 08/29/2011  . Hypertension 08/29/2011  . Abnormal EKG 08/29/2011  . Preoperative cardiovascular examination 08/29/2011  . Hyperlipidemia 08/29/2011  . Malaise 08/29/2011    History and Physical: For full details, please see admission history and physical. Briefly, Nathan Rivera is a 67 y.o. year old patient with multi-drug resistant UTI requiring IV antibiotics.  Hospital Course: Admitted from home.  Renal u/s confirmed no obstruction.  Scrotal U/s confirmed no abscess.   ID consulted.  Placed on Ertapenem.  PICC placed.  D/C home IV abx for at least 2 weeks.  Patient condition improved while hospitalized.   Laboratory values:   Recent Labs  02/25/14 0357  WBC 13.3*  HGB 13.4  HCT 40.7    Recent Labs  02/24/14 2040 02/25/14 0357 02/26/14 0327  NA 139 138 138  K 3.8 3.8 4.0  CL 103 103 106  CO2 22 22 21   GLUCOSE 104* 106* 109*  BUN 32* 31* 31*  CREATININE 1.94* 1.91* 1.85*  CALCIUM 9.3 8.9 8.9   No results found for this basename: LABPT, INR,  in the last 72 hours No results found for this basename: LABURIN,  in the last 72 hours Results for orders placed during the hospital encounter of 02/24/14  URINE CULTURE     Status: None   Collection Time    02/24/14  7:36 PM      Result Value Ref Range Status   Specimen  Description URINE, RANDOM   Final   Special Requests NONE   Final   Culture  Setup Time     Final   Value: 02/24/2014 22:03     Performed at [place]     Final   Value: NO GROWTH     Performed at [facility]   Culture     Final   Value: NO GROWTH     Performed at [facility]   Report Status 02/25/2014 FINAL   Final    Disposition: Home  Discharge instruction:  Provided to patient  Discharge medications:    Medication List    STOP taking these medications       amoxicillin-clavulanate 875-125 MG per tablet  Commonly known as:  AUGMENTIN     ibuprofen 200 MG tablet  Commonly known as:  ADVIL,MOTRIN      TAKE these medications       CRANBERRY EXTRACT PO  Take 1 tablet by mouth daily.     ertapenem 1 G injection  Commonly known as:  INVANZ  Inject 1 g into the vein daily.     ferrous fumarate 325 (106 FE) MG Tabs tablet  Commonly known as:  HEMOCYTE - 106 mg FE  Take 1 tablet by mouth daily.     pantoprazole 40 MG tablet  Commonly known as:  PROTONIX  Take 40 mg by mouth.     saccharomyces boulardii 250  MG capsule  Commonly known as:  FLORASTOR  Take 1 capsule (250 mg total) by mouth 2 (two) times daily.     simvastatin 20 MG tablet  Commonly known as:  ZOCOR  Take 20 mg by mouth at bedtime.     XARELTO 20 MG Tabs tablet  Generic drug:  rivaroxaban  Take 20 mg by mouth daily.        Followup:   As scheduled with me in July.

## 2014-02-28 NOTE — Telephone Encounter (Signed)
Hi Dr. Tommy Medal, [place] made Tammy and I aware yesterday. I will discuss with Dr. Johnnye Sima and we will get with the director of case management for the hospital and work this out. Thanks! Roselyn Reef

## 2014-02-28 NOTE — Telephone Encounter (Signed)
Thanks Jamie!

## 2014-03-10 ENCOUNTER — Ambulatory Visit (INDEPENDENT_AMBULATORY_CARE_PROVIDER_SITE_OTHER): Payer: Medicare Other | Admitting: Infectious Disease

## 2014-03-10 ENCOUNTER — Encounter: Payer: Self-pay | Admitting: Infectious Disease

## 2014-03-10 VITALS — BP 115/74 | HR 93 | Temp 98.2°F | Ht 71.0 in | Wt 221.0 lb

## 2014-03-10 DIAGNOSIS — Z1619 Resistance to other specified beta lactam antibiotics: Secondary | ICD-10-CM

## 2014-03-10 DIAGNOSIS — N451 Epididymitis: Secondary | ICD-10-CM

## 2014-03-10 DIAGNOSIS — B9689 Other specified bacterial agents as the cause of diseases classified elsewhere: Secondary | ICD-10-CM

## 2014-03-10 DIAGNOSIS — N453 Epididymo-orchitis: Secondary | ICD-10-CM

## 2014-03-10 DIAGNOSIS — Z1612 Extended spectrum beta lactamase (ESBL) resistance: Secondary | ICD-10-CM

## 2014-03-10 DIAGNOSIS — N390 Urinary tract infection, site not specified: Secondary | ICD-10-CM

## 2014-03-10 DIAGNOSIS — A499 Bacterial infection, unspecified: Secondary | ICD-10-CM

## 2014-03-10 MED ORDER — AMOXICILLIN-POT CLAVULANATE 875-125 MG PO TABS: 1.0000 | ORAL_TABLET | Freq: Two times a day (BID) | ORAL | Status: DC

## 2014-03-10 NOTE — Progress Notes (Signed)
   Subjective:    Patient ID: Nathan Rivera, male    DOB: 29-Nov-1945, 68 y.o.   MRN: [id number]  HPI  Nathan Rivera is a 68 y.o. male with 68 y.o. male with Prior ESBL bacteremia and septic shock due to obstructive stone with pyonephrosis sp 21 days of IV carbapenem, with ureteral stent placed who has had persistent colonization with this ESBL and now severe infection that is ascended and also likely is causing his epididymitis.  #1 ESBL complicated pyelonephritis with epididymitis: Appreciate Dr. Louis Meckel clarifying no stent in place anymore. Urine culture on admission was NGrowth on augmentin  Sensis from ESBL from office:   Despite fact that urine sterilized with augmentin I DID  not have sufficient faith trying to treat his condition with this abx for ESBL, We instead have continued on with now nearly 17 days of carbapenem therapy on  IV invanz.  Scrotal edema and testicular tenderness and pain have diminished by nearly twofold since having been in the hospital but he still has significant edema in his left testicle in particular.  He really wants very much to come off the IV antibiotics. Therefore we will make a trial of taking him off the IV Invanz and trialing him on oral Augmentin for least a month. He understands that his scrotal edema worsens that he will immediately analysis we can try either reinstitution of IV Invanz or intramuscular Invanz and in fact might try the latter option first.      Review of Systems  Constitutional: Negative for chills, activity change and appetite change.  HENT: Negative for congestion and dental problem.   Gastrointestinal: Negative for nausea, abdominal pain, diarrhea and abdominal distention.  Genitourinary: Positive for scrotal swelling and testicular pain.  Musculoskeletal: Negative for arthralgias, back pain, gait problem, joint swelling and myalgias.  Neurological: Negative for dizziness.  Hematological: Bruises/bleeds easily.    Psychiatric/Behavioral: Positive for dysphoric mood.       Objective:   Physical Exam  Constitutional: He is oriented to person, place, and time. He appears well-developed and well-nourished. No distress.  HENT:  Head: Normocephalic and atraumatic.  Mouth/Throat: Oropharynx is clear and moist.  Eyes: Conjunctivae and EOM are normal.  Neck: Normal range of motion. Neck supple. No JVD present.  Cardiovascular: Normal rate and regular rhythm.   Pulmonary/Chest: Effort normal. No respiratory distress. He has no wheezes.  Abdominal: He exhibits no distension.  Genitourinary: Left testis shows swelling and tenderness.  Neurological: He is alert and oriented to person, place, and time. He exhibits normal muscle tone. Coordination normal.  Skin: Skin is warm and dry. He is not diaphoretic. No erythema. No pallor.  Psychiatric: He has a normal mood and affect. His behavior is normal. Judgment and thought content normal.    PICC line: with some bleeding at the entry site, see picture:        Assessment & Plan:   Complicated UTI with epididymitis due to ESBL:  Finish 3 more days of Invanz been good Augmentin for a month.  He knows that if he has worsening symptoms of scrotal edema or pain he should let him know immediately so that we can try intramuscular Invanz or reinstitution of IV Invanz  I spent greater than 25 minutes with the patient including greater than 50% of time in face to face counsel of the patient and in coordination of their care.

## 2014-04-07 ENCOUNTER — Encounter (INDEPENDENT_AMBULATORY_CARE_PROVIDER_SITE_OTHER): Payer: Medicare Other | Admitting: General Surgery

## 2014-04-09 ENCOUNTER — Ambulatory Visit: Payer: Medicare Other | Admitting: Infectious Disease

## 2014-04-16 ENCOUNTER — Ambulatory Visit (INDEPENDENT_AMBULATORY_CARE_PROVIDER_SITE_OTHER): Payer: Medicare Other | Admitting: Infectious Disease

## 2014-04-16 ENCOUNTER — Encounter: Payer: Self-pay | Admitting: Infectious Disease

## 2014-04-16 VITALS — BP 121/80 | HR 78 | Temp 98.2°F | Wt 228.0 lb

## 2014-04-16 DIAGNOSIS — N453 Epididymo-orchitis: Secondary | ICD-10-CM

## 2014-04-16 DIAGNOSIS — N451 Epididymitis: Secondary | ICD-10-CM

## 2014-04-16 DIAGNOSIS — Z1612 Extended spectrum beta lactamase (ESBL) resistance: Principal | ICD-10-CM

## 2014-04-16 DIAGNOSIS — N410 Acute prostatitis: Secondary | ICD-10-CM

## 2014-04-16 DIAGNOSIS — B9689 Other specified bacterial agents as the cause of diseases classified elsewhere: Secondary | ICD-10-CM

## 2014-04-16 DIAGNOSIS — N390 Urinary tract infection, site not specified: Secondary | ICD-10-CM

## 2014-04-16 DIAGNOSIS — Z1619 Resistance to other specified beta lactam antibiotics: Secondary | ICD-10-CM

## 2014-04-16 DIAGNOSIS — A499 Bacterial infection, unspecified: Secondary | ICD-10-CM

## 2014-04-16 NOTE — Progress Notes (Signed)
   Subjective:    Patient ID: Nathan Rivera, male    DOB: May 01, 1946, 68 y.o.   MRN: [id number]  HPI   Nathan Rivera is a 68 y.o. male with 68 y.o. male with Prior ESBL bacteremia and septic shock due to obstructive stone with pyonephrosis sp 21 days of IV carbapenem, with ureteral stent placed who has had persistent colonization with this ESBL and now severe infection that is ascended and also likely is causing his epididymitis.     Sensis from ESBL from office:   Despite fact that urine sterilized with augmentin I DID  not have sufficient faith trying to treat his condition with this abx for ESBL, We instead have continued on with now nearly 17 days of carbapenem therapy on  IV invanz.  Scrotal edema and testicular tenderness and pain had diminished but not resolved. we took him off of IV Invanz and converted him to  oral Augmentin for least a month  In the interim he has had essential complete resolution of his scrotal edema and pain and has been back to being physically active including golfing which he apparently missed.       Review of Systems  Constitutional: Negative for chills, activity change and appetite change.  HENT: Negative for congestion and dental problem.   Gastrointestinal: Negative for nausea, abdominal pain, diarrhea and abdominal distention.  Genitourinary: Positive for scrotal swelling and testicular pain.  Musculoskeletal: Negative for arthralgias, back pain, gait problem, joint swelling and myalgias.  Neurological: Negative for dizziness.  Hematological: Bruises/bleeds easily.  Psychiatric/Behavioral: Positive for dysphoric mood.       Objective:   Physical Exam  Constitutional: He is oriented to person, place, and time. He appears well-developed and well-nourished. No distress.  HENT:  Head: Normocephalic and atraumatic.  Mouth/Throat: Oropharynx is clear and moist.  Eyes: Conjunctivae and EOM are normal.  Neck: Normal range of motion. Neck  supple. No JVD present.  Cardiovascular: Normal rate and regular rhythm.   Pulmonary/Chest: Effort normal. No respiratory distress. He has no wheezes.  Abdominal: He exhibits no distension.  Genitourinary: Testes normal.  Neurological: He is alert and oriented to person, place, and time. He exhibits normal muscle tone. Coordination normal.  Skin: Skin is warm and dry. He is not diaphoretic. No erythema. No pallor.  Psychiatric: He has a normal mood and affect. His behavior is normal. Judgment and thought content normal.         Assessment & Plan:   Complicated UTI with epididymitis due to ESBL:  He's finished 17 days of IV Invanz and also a month of Augmentin will be finished in 5 days.  He knows to monitor for a symptoms of recurrence to let me know immediately.  I also emphasized with him the need to "stiff arm" other physicians on giving him antibiotics for bacteria that may be present as urine WHEN he does not have ACTUAL SYMPTOMS as this has led to him becoming calm eyes with this highly resistant organism. Keeping this gentleman off antibiotics may allow [place] to facilitate recanalization with more susceptible organisms that could be more easily treated should he actually need treatment as he did recently  I spent greater than 25 minutes with the patient including greater than 50% of time in face to face counsel of the patient and in coordination of their care.

## 2014-04-25 ENCOUNTER — Ambulatory Visit (INDEPENDENT_AMBULATORY_CARE_PROVIDER_SITE_OTHER): Payer: Medicare Other | Admitting: General Surgery

## 2014-04-25 ENCOUNTER — Encounter (INDEPENDENT_AMBULATORY_CARE_PROVIDER_SITE_OTHER): Payer: Self-pay | Admitting: General Surgery

## 2014-04-25 ENCOUNTER — Encounter (INDEPENDENT_AMBULATORY_CARE_PROVIDER_SITE_OTHER): Payer: Medicare Other | Admitting: General Surgery

## 2014-04-25 VITALS — BP 122/70 | HR 89 | Temp 97.9°F | Ht 71.0 in | Wt 228.0 lb

## 2014-04-25 DIAGNOSIS — K251 Acute gastric ulcer with perforation: Secondary | ICD-10-CM

## 2014-04-25 NOTE — Patient Instructions (Signed)
May return to normal activities 

## 2014-04-28 NOTE — Progress Notes (Signed)
Subjective:     Patient ID: Nathan Rivera, male   DOB: 1946/03/31, 68 y.o.   MRN: [id number]  HPI The patient is a 68 year old white male who is about 6 months status post omental patch repair of a perforated gastric ulcer. His postoperative course was complicated by a DVT for which he has been on Eliquis. He has also had an open abdominal wound that has been slow to heal. He has been doing well and is back out on the golf course. He denies any abdominal pain. His appetite is good and his bowels are working normally.  Review of Systems     Objective:   Physical Exam On exam his abdomen was soft and nontender. His midline wound is almost completely healed except for a small 1-2 mm opening at the midportion of the wound. Otherwise it is very clean.    Assessment:     The patient is 6 months status post omental patch repair of a perforated gastric ulcer     Plan:     At this point he may return to all his normal activities. I think his wound will finish healing without problems. I will plan to see him back on a when necessary basis.

## 2014-06-18 ENCOUNTER — Encounter: Payer: Self-pay | Admitting: Infectious Disease

## 2014-08-18 DIAGNOSIS — K269 Duodenal ulcer, unspecified as acute or chronic, without hemorrhage or perforation: Secondary | ICD-10-CM | POA: Insufficient documentation

## 2014-08-18 DIAGNOSIS — R7302 Impaired glucose tolerance (oral): Secondary | ICD-10-CM | POA: Insufficient documentation

## 2014-09-24 ENCOUNTER — Other Ambulatory Visit (INDEPENDENT_AMBULATORY_CARE_PROVIDER_SITE_OTHER): Payer: Self-pay | Admitting: General Surgery

## 2014-10-13 ENCOUNTER — Encounter (HOSPITAL_COMMUNITY): Payer: Self-pay | Admitting: Pharmacy Technician

## 2014-10-14 NOTE — Pre-Procedure Instructions (Signed)
Nathan Rivera  10/14/2014   Your procedure is scheduled on:   Friday  10/24/14  Report to [facility] Admitting at 530 AM.  Call this number if you have problems the morning of surgery: [phone]   Remember:   Do not eat food or drink liquids after midnight.   Take these medicines the morning of surgery with A SIP OF WATER:  PANTOPRAZOLE (STOP ASPIRIN, COUMADIN, PLAVIX, EFFIENT, HERBAL MEDICINES), follow instructions from Vascular doctor relative              to Xarelto   Do not wear jewelry   Do not wear lotions, powders, or perfumes. You may wear deodorant.              Men may shave face and neck.   Do not bring valuables to the hospital.  [facility] is not responsible    for any belongings or valuables.               Contacts, dentures or bridgework may not be worn into surgery.    Leave suitcase in the car. After surgery it may be brought to your room.   For patients admitted to the hospital, discharge time is determined by your                treatment team.               Patients discharged the day of surgery will not be allowed to drive  home.  Name and phone number of your driver: with family   Special Instructions: [facility] - Preparing for Surgery  Before surgery, you can play an important role.  Because skin is not sterile, your skin needs to be as free of germs as possible.  You can reduce the number of germs on you skin by washing with CHG (chlorahexidine gluconate) soap before surgery.  CHG is an antiseptic cleaner which kills germs and bonds with the skin to continue killing germs even after washing.  Please DO NOT use if you have an allergy to CHG or antibacterial soaps.  If your skin becomes reddened/irritated stop using the CHG and inform your nurse when you arrive at Short Stay.  Do not shave (including legs and underarms) for at least 48 hours prior to the first CHG shower.  You may shave your face.  Please follow these instructions  carefully:   1.  Shower with CHG Soap the night before surgery and the                                morning of Surgery.  2.  If you choose to wash your hair, wash your hair first as usual with your       normal shampoo.  3.  After you shampoo, rinse your hair and body thoroughly to remove the                      Shampoo.  4.  Use CHG as you would any other liquid soap.  You can apply chg directly       to the skin and wash gently with scrungie or a clean washcloth.  5.  Apply the CHG Soap to your body ONLY FROM THE NECK DOWN.        Do not use on open wounds or open sores.  Avoid contact with your eyes,  ears, mouth and genitals (private parts).  Wash genitals (private parts)       with your normal soap.  6.  Wash thoroughly, paying special attention to the area where your surgery        will be performed.  7.  Thoroughly rinse your body with warm water from the neck down.  8.  DO NOT shower/wash with your normal soap after using and rinsing off       the CHG Soap.  9.  Pat yourself dry with a clean towel.            10.  Wear clean pajamas.            11.  Place clean sheets on your bed the night of your first shower and do not        sleep with pets.  Day of Surgery  Do not apply any lotions/deoderants the morning of surgery.  Please wear clean clothes to the hospital/surgery center.     Please read over the following fact sheets that you were given: Pain Booklet, Coughing and Deep Breathing and Surgical Site Infection Prevention

## 2014-10-15 ENCOUNTER — Encounter (HOSPITAL_COMMUNITY): Payer: Self-pay

## 2014-10-15 ENCOUNTER — Encounter (HOSPITAL_COMMUNITY)
Admission: RE | Admit: 2014-10-15 | Discharge: 2014-10-15 | Disposition: A | Discharge status: Home / Self Care | Payer: Medicare Other | Source: Ambulatory Visit | Attending: General Surgery | Admitting: General Surgery

## 2014-10-15 DIAGNOSIS — Z01818 Encounter for other preprocedural examination: Secondary | ICD-10-CM | POA: Insufficient documentation

## 2014-10-15 DIAGNOSIS — K432 Incisional hernia without obstruction or gangrene: Secondary | ICD-10-CM | POA: Diagnosis not present

## 2014-10-15 HISTORY — DX: Gastro-esophageal reflux disease without esophagitis: K21.9

## 2014-10-15 HISTORY — DX: Other specified disorders of muscle: M62.89

## 2014-10-15 HISTORY — DX: Acute embolism and thrombosis of left popliteal vein: I82.432

## 2014-10-15 LAB — CBC
HEMATOCRIT: 51 % (ref 39.0–52.0)
HEMOGLOBIN: 17.2 g/dL — AB (ref 13.0–17.0)
MCH: 30.6 pg (ref 26.0–34.0)
MCHC: 33.7 g/dL (ref 30.0–36.0)
MCV: 90.6 fL (ref 78.0–100.0)
Platelets: 175 10*3/uL (ref 150–400)
RBC: 5.63 MIL/uL (ref 4.22–5.81)
RDW: 14.7 % (ref 11.5–15.5)
WBC: 9 10*3/uL (ref 4.0–10.5)

## 2014-10-15 LAB — BASIC METABOLIC PANEL
Anion gap: 10 (ref 5–15)
BUN: 27 mg/dL — AB (ref 6–23)
CO2: 26 mmol/L (ref 19–32)
CREATININE: 2.13 mg/dL — AB (ref 0.50–1.35)
Calcium: 9.4 mg/dL (ref 8.4–10.5)
Chloride: 106 mEq/L (ref 96–112)
GFR calc Af Amer: 35 mL/min — ABNORMAL LOW (ref >90)
GFR calc non Af Amer: 30 mL/min — ABNORMAL LOW (ref >90)
Glucose, Bld: 108 mg/dL — ABNORMAL HIGH (ref 70–99)
Potassium: 4.4 mmol/L (ref 3.5–5.1)
SODIUM: 142 mmol/L (ref 135–145)

## 2014-10-15 NOTE — Progress Notes (Signed)
Pt. Seeing his Vascular MD tomorrow & will be stopping the Xarelto & having 2 filters put in at an out pt. Facility.

## 2014-10-16 NOTE — Progress Notes (Signed)
Anesthesia Chart Review:  Pt is 69 year old male scheduled for laparoscopic ventral hernia repair with mesh on 10/24/2014 with Dr. Marlou Starks.   PMH includes: HTN, CKD stage III, DVT 2015, hypercholesterolemia. S/p aorto bifemoral endograft placement for AAA (reportedly, by notes). S/p perforated gastric ulcer repair 2015. BMI 33. Current smoker.   Was in hospital 6/1-02/27/2014 for multi-drug resistant pyelonephritis, epididymitis, sepsis.   Was in hospital 11/23-12/06/2013 sepsis due to UTI, complicated by acute renal failure, acute respiratory failure, hydronephrosis, nephrolithiasis.   Medications include: xarelto. Pt saw vascular MD (Schier)  on 10/16/2014. Was instructed to stop xarelto 1/21. Is having "specialized bloodwork" on 1/25 to evaluate clotting. Pt reports Dr. Arneta Cliche told him if bloodwork is normal, pt will not need to resume xarelto. If bloodwork abnormal, pt will have IVC filter(s) placed on 1/27 at the vascular surgeon's office.   Preoperative labs reviewed.  Cr 2.13. Stable compared with previous (see care everywhere).   EKG: NSR.   If no changes, I anticipate pt can proceed with surgery as scheduled.   Willeen Cass, FNP-BC [facility] Short Stay Surgical Center/Anesthesiology Phone: [phone] 10/16/2014 2:50 PM

## 2014-10-20 ENCOUNTER — Ambulatory Visit: Payer: Self-pay | Admitting: Vascular Surgery

## 2014-10-20 LAB — APTT: ACTIVATED PTT: 35 s (ref 23.6–35.9)

## 2014-10-20 LAB — PROTIME-INR
INR: 1.5
Prothrombin Time: 17.8 secs — ABNORMAL HIGH (ref 11.5–14.7)

## 2014-10-23 ENCOUNTER — Ambulatory Visit: Payer: Self-pay | Admitting: Nephrology

## 2014-10-23 LAB — BASIC METABOLIC PANEL
Anion Gap: 6 — ABNORMAL LOW (ref 7–16)
BUN: 30 mg/dL — ABNORMAL HIGH (ref 7–18)
CALCIUM: 9 mg/dL (ref 8.5–10.1)
CHLORIDE: 112 mmol/L — AB (ref 98–107)
CO2: 26 mmol/L (ref 21–32)
CREATININE: 1.96 mg/dL — AB (ref 0.60–1.30)
EGFR (African American): 44 — ABNORMAL LOW
EGFR (Non-African Amer.): 36 — ABNORMAL LOW
Glucose: 83 mg/dL (ref 65–99)
OSMOLALITY: 292 (ref 275–301)
Potassium: 4.1 mmol/L (ref 3.5–5.1)
SODIUM: 144 mmol/L (ref 136–145)

## 2014-10-23 MED ORDER — CEFAZOLIN SODIUM-DEXTROSE 2-3 GM-% IV SOLR
2.0000 g | INTRAVENOUS | Status: AC
  Administered 2014-10-24: 2 g via INTRAVENOUS
  Filled 2014-10-23: qty 50

## 2014-10-24 ENCOUNTER — Ambulatory Visit (HOSPITAL_COMMUNITY): Payer: Medicare Other | Admitting: Emergency Medicine

## 2014-10-24 ENCOUNTER — Inpatient Hospital Stay (HOSPITAL_COMMUNITY)
Admission: RE | Admit: 2014-10-24 | Discharge: 2014-10-28 | DRG: 354 | Disposition: A | Discharge status: Home / Self Care | Payer: Medicare Other | Source: Ambulatory Visit | Attending: General Surgery | Admitting: General Surgery

## 2014-10-24 ENCOUNTER — Encounter (HOSPITAL_COMMUNITY): Payer: Self-pay | Admitting: *Deleted

## 2014-10-24 ENCOUNTER — Ambulatory Visit (HOSPITAL_COMMUNITY): Payer: Medicare Other | Admitting: Anesthesiology

## 2014-10-24 ENCOUNTER — Encounter (HOSPITAL_COMMUNITY)
Admission: RE | Disposition: A | Discharge status: Home / Self Care | Payer: Self-pay | Source: Ambulatory Visit | Attending: General Surgery

## 2014-10-24 DIAGNOSIS — Z86711 Personal history of pulmonary embolism: Secondary | ICD-10-CM | POA: Diagnosis not present

## 2014-10-24 DIAGNOSIS — Z7901 Long term (current) use of anticoagulants: Secondary | ICD-10-CM | POA: Diagnosis not present

## 2014-10-24 DIAGNOSIS — F17210 Nicotine dependence, cigarettes, uncomplicated: Secondary | ICD-10-CM | POA: Diagnosis present

## 2014-10-24 DIAGNOSIS — E785 Hyperlipidemia, unspecified: Secondary | ICD-10-CM | POA: Diagnosis present

## 2014-10-24 DIAGNOSIS — Y92230 Patient room in hospital as the place of occurrence of the external cause: Secondary | ICD-10-CM | POA: Diagnosis not present

## 2014-10-24 DIAGNOSIS — K432 Incisional hernia without obstruction or gangrene: Secondary | ICD-10-CM | POA: Diagnosis present

## 2014-10-24 DIAGNOSIS — K913 Postprocedural intestinal obstruction: Secondary | ICD-10-CM | POA: Diagnosis not present

## 2014-10-24 DIAGNOSIS — Y832 Surgical operation with anastomosis, bypass or graft as the cause of abnormal reaction of the patient, or of later complication, without mention of misadventure at the time of the procedure: Secondary | ICD-10-CM | POA: Diagnosis not present

## 2014-10-24 DIAGNOSIS — R1900 Intra-abdominal and pelvic swelling, mass and lump, unspecified site: Secondary | ICD-10-CM | POA: Diagnosis present

## 2014-10-24 DIAGNOSIS — K567 Ileus, unspecified: Secondary | ICD-10-CM | POA: Diagnosis not present

## 2014-10-24 DIAGNOSIS — Z86718 Personal history of other venous thrombosis and embolism: Secondary | ICD-10-CM

## 2014-10-24 HISTORY — PX: INSERTION OF MESH: SHX5868

## 2014-10-24 HISTORY — PX: VENTRAL HERNIA REPAIR: SHX424

## 2014-10-24 SURGERY — REPAIR, HERNIA, VENTRAL, LAPAROSCOPIC
Anesthesia: General | Site: Abdomen

## 2014-10-24 MED ORDER — ONDANSETRON HCL 4 MG/2ML IJ SOLN: 4.0000 mg | Freq: Four times a day (QID) | INTRAMUSCULAR | Status: DC | PRN

## 2014-10-24 MED ORDER — OXYCODONE-ACETAMINOPHEN 5-325 MG PO TABS
ORAL_TABLET | ORAL | Status: AC
Start: 2014-10-24 — End: 2014-10-24
  Filled 2014-10-24: qty 2

## 2014-10-24 MED ORDER — FENTANYL CITRATE 0.05 MG/ML IJ SOLN
INTRAMUSCULAR | Status: DC | PRN
  Administered 2014-10-24: 100 ug via INTRAVENOUS
  Administered 2014-10-24: 50 ug via INTRAVENOUS
  Administered 2014-10-24: 150 ug via INTRAVENOUS
  Administered 2014-10-24 (×2): 50 ug via INTRAVENOUS

## 2014-10-24 MED ORDER — ONDANSETRON HCL 4 MG/2ML IJ SOLN
INTRAMUSCULAR | Status: DC | PRN
  Administered 2014-10-24: 4 mg via INTRAVENOUS

## 2014-10-24 MED ORDER — DEXAMETHASONE SODIUM PHOSPHATE 4 MG/ML IJ SOLN
INTRAMUSCULAR | Status: AC
  Filled 2014-10-24: qty 2

## 2014-10-24 MED ORDER — MORPHINE SULFATE 2 MG/ML IJ SOLN
1.0000 mg | INTRAMUSCULAR | Status: DC | PRN
  Administered 2014-10-24: 2 mg via INTRAVENOUS
  Administered 2014-10-24 (×2): 4 mg via INTRAVENOUS
  Administered 2014-10-25: 2 mg via INTRAVENOUS
  Filled 2014-10-24: qty 1
  Filled 2014-10-24 (×2): qty 2
  Filled 2014-10-24: qty 1

## 2014-10-24 MED ORDER — NEOSTIGMINE METHYLSULFATE 10 MG/10ML IV SOLN
INTRAVENOUS | Status: DC | PRN
  Administered 2014-10-24: 4 mg via INTRAVENOUS

## 2014-10-24 MED ORDER — FENTANYL CITRATE 0.05 MG/ML IJ SOLN
INTRAMUSCULAR | Status: AC
  Filled 2014-10-24: qty 5

## 2014-10-24 MED ORDER — LIDOCAINE HCL (CARDIAC) 20 MG/ML IV SOLN
INTRAVENOUS | Status: DC | PRN
  Administered 2014-10-24: 70 mg via INTRAVENOUS
  Administered 2014-10-24: 30 mg via INTRAVENOUS

## 2014-10-24 MED ORDER — HEPARIN SODIUM (PORCINE) 5000 UNIT/ML IJ SOLN
5000.0000 [IU] | Freq: Three times a day (TID) | INTRAMUSCULAR | Status: DC
  Administered 2014-10-25: 5000 [IU] via SUBCUTANEOUS
  Filled 2014-10-24: qty 1

## 2014-10-24 MED ORDER — 0.9 % SODIUM CHLORIDE (POUR BTL) OPTIME
TOPICAL | Status: DC | PRN
  Administered 2014-10-24: 1000 mL

## 2014-10-24 MED ORDER — MIDAZOLAM HCL 5 MG/5ML IJ SOLN
INTRAMUSCULAR | Status: DC | PRN
  Administered 2014-10-24: 2 mg via INTRAVENOUS

## 2014-10-24 MED ORDER — PROPOFOL 10 MG/ML IV BOLUS
INTRAVENOUS | Status: AC
  Filled 2014-10-24: qty 20

## 2014-10-24 MED ORDER — HYDROMORPHONE HCL 1 MG/ML IJ SOLN
0.2500 mg | INTRAMUSCULAR | Status: DC | PRN
  Administered 2014-10-24 (×2): 0.5 mg via INTRAVENOUS

## 2014-10-24 MED ORDER — OXYCODONE-ACETAMINOPHEN 5-325 MG PO TABS
1.0000 | ORAL_TABLET | ORAL | Status: DC | PRN
  Administered 2014-10-24 – 2014-10-27 (×12): 2 via ORAL
  Filled 2014-10-24 (×11): qty 2

## 2014-10-24 MED ORDER — CHLORHEXIDINE GLUCONATE 4 % EX LIQD
1.0000 "application " | Freq: Once | CUTANEOUS | Status: DC
  Filled 2014-10-24: qty 15

## 2014-10-24 MED ORDER — RIVAROXABAN 20 MG PO TABS
20.0000 mg | ORAL_TABLET | Freq: Every day | ORAL | Status: DC
  Administered 2014-10-25 – 2014-10-27 (×3): 20 mg via ORAL
  Filled 2014-10-24 (×4): qty 1

## 2014-10-24 MED ORDER — BUPIVACAINE-EPINEPHRINE 0.25% -1:200000 IJ SOLN
INTRAMUSCULAR | Status: DC | PRN
  Administered 2014-10-24: 30 mL

## 2014-10-24 MED ORDER — LACTATED RINGERS IV SOLN
INTRAVENOUS | Status: DC | PRN
  Administered 2014-10-24: 07:00:00 via INTRAVENOUS

## 2014-10-24 MED ORDER — ONDANSETRON HCL 4 MG PO TABS: 4.0000 mg | ORAL_TABLET | Freq: Four times a day (QID) | ORAL | Status: DC | PRN

## 2014-10-24 MED ORDER — PROPOFOL 10 MG/ML IV BOLUS
INTRAVENOUS | Status: DC | PRN
  Administered 2014-10-24: 150 mg via INTRAVENOUS

## 2014-10-24 MED ORDER — MEPERIDINE HCL 25 MG/ML IJ SOLN: 6.2500 mg | INTRAMUSCULAR | Status: DC | PRN

## 2014-10-24 MED ORDER — HYDROMORPHONE HCL 1 MG/ML IJ SOLN
INTRAMUSCULAR | Status: AC
Start: 2014-10-24 — End: 2014-10-24
  Filled 2014-10-24: qty 1

## 2014-10-24 MED ORDER — KCL IN DEXTROSE-NACL 20-5-0.9 MEQ/L-%-% IV SOLN
INTRAVENOUS | Status: DC
  Administered 2014-10-24 – 2014-10-27 (×4): via INTRAVENOUS
  Filled 2014-10-24 (×6): qty 1000

## 2014-10-24 MED ORDER — PROMETHAZINE HCL 25 MG/ML IJ SOLN: 6.2500 mg | INTRAMUSCULAR | Status: DC | PRN

## 2014-10-24 MED ORDER — SODIUM CHLORIDE 0.9 % IR SOLN
Status: DC | PRN
  Administered 2014-10-24: 1000 mL

## 2014-10-24 MED ORDER — ROCURONIUM BROMIDE 100 MG/10ML IV SOLN
INTRAVENOUS | Status: DC | PRN
  Administered 2014-10-24: 50 mg via INTRAVENOUS
  Administered 2014-10-24 (×2): 10 mg via INTRAVENOUS

## 2014-10-24 MED ORDER — PANTOPRAZOLE SODIUM 40 MG IV SOLR
40.0000 mg | INTRAVENOUS | Status: DC
  Administered 2014-10-24 – 2014-10-27 (×4): 40 mg via INTRAVENOUS
  Filled 2014-10-24 (×4): qty 40

## 2014-10-24 MED ORDER — MIDAZOLAM HCL 2 MG/2ML IJ SOLN: 0.5000 mg | Freq: Once | INTRAMUSCULAR | Status: DC | PRN

## 2014-10-24 MED ORDER — ESMOLOL HCL 10 MG/ML IV SOLN
INTRAVENOUS | Status: DC | PRN
  Administered 2014-10-24: 30 mg via INTRAVENOUS

## 2014-10-24 MED ORDER — BUPIVACAINE-EPINEPHRINE (PF) 0.25% -1:200000 IJ SOLN
INTRAMUSCULAR | Status: AC
  Filled 2014-10-24: qty 30

## 2014-10-24 MED ORDER — MIDAZOLAM HCL 2 MG/2ML IJ SOLN
INTRAMUSCULAR | Status: AC
  Filled 2014-10-24: qty 2

## 2014-10-24 MED ORDER — PHENYLEPHRINE HCL 10 MG/ML IJ SOLN
INTRAMUSCULAR | Status: DC | PRN
  Administered 2014-10-24 (×2): 80 ug via INTRAVENOUS

## 2014-10-24 MED ORDER — DEXAMETHASONE SODIUM PHOSPHATE 4 MG/ML IJ SOLN
INTRAMUSCULAR | Status: DC | PRN
  Administered 2014-10-24: 8 mg via INTRAVENOUS

## 2014-10-24 MED ORDER — GLYCOPYRROLATE 0.2 MG/ML IJ SOLN
INTRAMUSCULAR | Status: DC | PRN
  Administered 2014-10-24: 0.6 mg via INTRAVENOUS

## 2014-10-24 SURGICAL SUPPLY — 54 items
APPLIER CLIP ROT 10 11.4 M/L (STAPLE)
CANISTER SUCTION 2500CC (MISCELLANEOUS) IMPLANT
CHLORAPREP W/TINT 26ML (MISCELLANEOUS) ×3 IMPLANT
CLIP APPLIE ROT 10 11.4 M/L (STAPLE) IMPLANT
COVER SURGICAL LIGHT HANDLE (MISCELLANEOUS) ×3 IMPLANT
DEVICE SECURE STRAP 25 ABSORB (INSTRUMENTS) ×6 IMPLANT
DEVICE TROCAR PUNCTURE CLOSURE (ENDOMECHANICALS) ×3 IMPLANT
DRAPE INCISE IOBAN 66X45 STRL (DRAPES) ×3 IMPLANT
DRAPE LAPAROSCOPIC ABDOMINAL (DRAPES) ×3 IMPLANT
ELECT CAUTERY BLADE 6.4 (BLADE) ×3 IMPLANT
ELECT REM PT RETURN 9FT ADLT (ELECTROSURGICAL) ×3
ELECTRODE REM PT RTRN 9FT ADLT (ELECTROSURGICAL) ×1 IMPLANT
GAUZE SPONGE 2X2 8PLY STRL LF (GAUZE/BANDAGES/DRESSINGS) ×1 IMPLANT
GLOVE BIO SURGEON STRL SZ 6.5 (GLOVE) ×2 IMPLANT
GLOVE BIO SURGEON STRL SZ7.5 (GLOVE) ×3 IMPLANT
GLOVE BIO SURGEONS STRL SZ 6.5 (GLOVE) ×1
GLOVE BIOGEL PI IND STRL 7.0 (GLOVE) ×3 IMPLANT
GLOVE BIOGEL PI INDICATOR 7.0 (GLOVE) ×6
GLOVE SURG SIGNA 7.5 PF LTX (GLOVE) ×3 IMPLANT
GLOVE SURG SS PI 7.0 STRL IVOR (GLOVE) ×6 IMPLANT
GOWN STRL REUS W/ TWL LRG LVL3 (GOWN DISPOSABLE) ×3 IMPLANT
GOWN STRL REUS W/ TWL XL LVL3 (GOWN DISPOSABLE) ×1 IMPLANT
GOWN STRL REUS W/TWL LRG LVL3 (GOWN DISPOSABLE) ×6
GOWN STRL REUS W/TWL XL LVL3 (GOWN DISPOSABLE) ×2
KIT BASIN OR (CUSTOM PROCEDURE TRAY) ×3 IMPLANT
KIT ROOM TURNOVER OR (KITS) ×3 IMPLANT
LIQUID BAND (GAUZE/BANDAGES/DRESSINGS) ×3 IMPLANT
MARKER SKIN DUAL TIP RULER LAB (MISCELLANEOUS) ×3 IMPLANT
MESH VENTRALIGHT ST 7X9N (Mesh General) ×3 IMPLANT
NEEDLE SPNL 22GX3.5 QUINCKE BK (NEEDLE) ×3 IMPLANT
NS IRRIG 1000ML POUR BTL (IV SOLUTION) ×3 IMPLANT
PAD ARMBOARD 7.5X6 YLW CONV (MISCELLANEOUS) ×6 IMPLANT
PENCIL BUTTON HOLSTER BLD 10FT (ELECTRODE) ×3 IMPLANT
SCALPEL HARMONIC ACE (MISCELLANEOUS) ×3 IMPLANT
SCISSORS LAP 5X35 DISP (ENDOMECHANICALS) IMPLANT
SET IRRIG TUBING LAPAROSCOPIC (IRRIGATION / IRRIGATOR) IMPLANT
SLEEVE ENDOPATH XCEL 5M (ENDOMECHANICALS) ×6 IMPLANT
SPONGE GAUZE 2X2 STER 10/PKG (GAUZE/BANDAGES/DRESSINGS) ×2
SPONGE GAUZE 4X4 12PLY STER LF (GAUZE/BANDAGES/DRESSINGS) ×3 IMPLANT
SUT MNCRL AB 4-0 PS2 18 (SUTURE) ×3 IMPLANT
SUT NOVA 1 T20/GS 25DT (SUTURE) ×3 IMPLANT
SUT NOVA NAB DX-16 0-1 5-0 T12 (SUTURE) ×12 IMPLANT
SYR BULB IRRIGATION 50ML (SYRINGE) ×3 IMPLANT
TAPE CLOTH SURG 4X10 WHT LF (GAUZE/BANDAGES/DRESSINGS) ×3 IMPLANT
TOWEL OR 17X24 6PK STRL BLUE (TOWEL DISPOSABLE) ×3 IMPLANT
TOWEL OR 17X26 10 PK STRL BLUE (TOWEL DISPOSABLE) ×3 IMPLANT
TRAY FOLEY CATH 16FR SILVER (SET/KITS/TRAYS/PACK) ×3 IMPLANT
TRAY LAPAROSCOPIC (CUSTOM PROCEDURE TRAY) ×3 IMPLANT
TROCAR XCEL NON-BLD 11X100MML (ENDOMECHANICALS) ×3 IMPLANT
TROCAR XCEL NON-BLD 5MMX100MML (ENDOMECHANICALS) ×3 IMPLANT
TUBE CONNECTING 12'X1/4 (SUCTIONS) ×1
TUBE CONNECTING 12X1/4 (SUCTIONS) ×2 IMPLANT
TUBING INSUFFLATION (TUBING) ×3 IMPLANT
YANKAUER SUCT BULB TIP NO VENT (SUCTIONS) ×3 IMPLANT

## 2014-10-24 NOTE — Progress Notes (Signed)
UR completed.  Elisavet Buehrer, RN BSN [facility] CCM Trauma/Neuro ICU Case Manager [phone]  

## 2014-10-24 NOTE — Transfer of Care (Signed)
Immediate Anesthesia Transfer of Care Note  Patient: Nathan Rivera  Procedure(s) Performed: Procedure(s): LAPAROSCOPIC ASSISTED VENTRAL HERNIA REPAIR WITH MESH (N/A) INSERTION OF MESH (N/A)  Patient Location: PACU  Anesthesia Type:General  Level of Consciousness: awake, alert , oriented and patient cooperative  Airway & Oxygen Therapy: Patient Spontanous Breathing and Patient connected to nasal cannula oxygen  Post-op Assessment: Report given to RN and Post -op Vital signs reviewed and stable  Post vital signs: Reviewed and stable  Last Vitals:  Filed Vitals:   10/24/14 0618  BP: 146/82  Pulse: 61  Temp: 36.6 C  Resp: 20    Complications: No apparent anesthesia complications

## 2014-10-24 NOTE — Interval H&P Note (Signed)
History and Physical Interval Note:  10/24/2014 7:08 AM  Nathan Rivera  has presented today for surgery, with the diagnosis of Ventral Incisional Hernia  The various methods of treatment have been discussed with the patient and family. After consideration of risks, benefits and other options for treatment, the patient has consented to  Procedure(s): [place] (N/A) INSERTION OF MESH (N/A) as a surgical intervention .  The patient's history has been reviewed, patient examined, no change in status, stable for surgery.  I have reviewed the patient's chart and labs.  Questions were answered to the patient's satisfaction.     TOTH III,[name]

## 2014-10-24 NOTE — H&P (Signed)
Nathan Rivera 09/05/2014 4:00 PM Location: [facility] Patient #: [id number] DOB: May 01, 1946 Married / Language: English / Race: White Male  History of Present Illness Sammuel Hines. Marlou Starks MD; 09/05/2014 4:31 PM) Patient words: long term f/u possible rt. side hernia.  The patient is a 69 year old male who presents with abdominal swelling. The patient is a 69 year old white male who is about 10 months status post open repair of a perforated gastric ulcer. His postoperative course was complicated by a wound infection that took some time to heal. Over the last few months he has noticed some bulging near the incision of his abdominal wall. He has had some discomfort associated with this. He denies any nausea or vomiting. His appetite is good and his bowels are working normally.   Other Problems Briant Cedar, CMA; 09/05/2014 4:00 PM) Arthritis Back Pain Bladder Problems Cancer Chronic Renal Failure Syndrome Gastric Ulcer High blood pressure Hypercholesterolemia Kidney Stone Other disease, cancer, significant illness Pulmonary Embolism / Blood Clot in Legs Umbilical Hernia Repair  Past Surgical History Briant Cedar, [place]; 09/05/2014 4:00 PM) Aneurysm Repair Resection of Stomach Tonsillectomy Ventral / Umbilical Hernia Surgery Left.  Diagnostic Studies History Briant Cedar, [place]; 09/05/2014 4:00 PM) Colonoscopy never  Allergies Briant Cedar, CMA; 09/05/2014 4:01 PM) No Known Drug Allergies12/07/2014  Medication History Briant Cedar, CMA; 09/05/2014 4:03 PM) Pantoprazole Sodium (40MG  Tablet DR, Oral) Active. Xarelto (20MG  Tablet, Oral) Active. Simvastatin (20MG  Tablet, Oral) Active. Protonix (40MG  Tablet DR, Oral) Active.  Social History Briant Cedar, [place]; 09/05/2014 4:00 PM) Alcohol use Occasional alcohol use. Caffeine use Coffee. Tobacco use Current every day smoker.  Family History Briant Cedar, [place]; 09/05/2014  4:00 PM) Cerebrovascular Accident Sister. Heart Disease Sister. Hypertension Father, Sister.  Review of Systems Briant Cedar CMA; 09/05/2014 4:00 PM) General Present- Fatigue. Not Present- Appetite Loss, Chills, Fever, Night Sweats, Weight Gain and Weight Loss. Skin Present- Non-Healing Wounds. Not Present- Change in Wart/Mole, Dryness, Hives, Jaundice, New Lesions, Rash and Ulcer. HEENT Not Present- Earache, Hearing Loss, Hoarseness, Nose Bleed, Oral Ulcers, Ringing in the Ears, Seasonal Allergies, Sinus Pain, Sore Throat, Visual Disturbances, Wears glasses/contact lenses and Yellow Eyes. Respiratory Present- Chronic Cough. Not Present- Bloody sputum, Difficulty Breathing, Snoring and Wheezing. Breast Not Present- Breast Mass, Breast Pain, Nipple Discharge and Skin Changes. Cardiovascular Present- Leg Cramps. Not Present- Chest Pain, Difficulty Breathing Lying Down, Palpitations, Rapid Heart Rate, Shortness of Breath and Swelling of Extremities. Gastrointestinal Present- Abdominal Pain. Not Present- Bloating, Bloody Stool, Change in Bowel Habits, Chronic diarrhea, Constipation, Difficulty Swallowing, Excessive gas, Gets full quickly at meals, Hemorrhoids, Indigestion, Nausea, Rectal Pain and Vomiting. Male Genitourinary Not Present- Blood in Urine, Change in Urinary Stream, Frequency, Impotence, Nocturia, Painful Urination, Urgency and Urine Leakage. Musculoskeletal Present- Back Pain, Joint Pain, Joint Stiffness and Muscle Weakness. Not Present- Muscle Pain and Swelling of Extremities. Neurological Not Present- Decreased Memory, Fainting, Headaches, Numbness, Seizures, Tingling, Tremor, Trouble walking and Weakness. Psychiatric Not Present- Anxiety, Bipolar, Change in Sleep Pattern, Depression, Fearful and Frequent crying. Endocrine Not Present- Cold Intolerance, Excessive Hunger, Hair Changes, Heat Intolerance, Hot flashes and New Diabetes. Hematology Present- Easy Bruising. Not Present-  Excessive bleeding, Gland problems, HIV and Persistent Infections.   Vitals Briant Cedar CMA; 09/05/2014 4:04 PM) 09/05/2014 4:03 PM Weight: 237.25 lb Height: 71in Body Surface Area: 2.32 m Body Mass Index: 33.09 kg/m Temp.: 98.61F  Pulse: 86 (Regular)  BP: 122/68 (Sitting, Left Arm, Standard)    Physical Exam Eddie Dibbles S. Marlou Starks MD; 09/05/2014 4:33  PM) General Mental Status-Alert. General Appearance-Consistent with stated age. Hydration-Well hydrated. Voice-Normal.  Head and Neck Head-normocephalic, atraumatic with no lesions or palpable masses. Trachea-midline. Thyroid Gland Characteristics - normal size and consistency.  Eye Eyeball - Bilateral-Extraocular movements intact. Sclera/Conjunctiva - Bilateral-No scleral icterus.  Chest and Lung Exam Chest and lung exam reveals -quiet, even and easy respiratory effort with no use of accessory muscles and on auscultation, normal breath sounds, no adventitious sounds and normal vocal resonance. Inspection Chest Wall - Normal. Back - normal.  Cardiovascular Cardiovascular examination reveals -normal heart sounds, regular rate and rhythm with no murmurs and normal pedal pulses bilaterally.  Abdomen Note: The abdomen is soft and nontender. The midline incision has finally healed. There are 2 palpable fascial defects just to the right of the incision, each one feels at least 3-4 cm across. There is no sign of obstruction. The hernias reduce easily.   Neurologic Neurologic evaluation reveals -alert and oriented x 3 with no impairment of recent or remote memory. Mental Status-Normal.  Musculoskeletal Normal Exam - Left-Upper Extremity Strength Normal and Lower Extremity Strength Normal. Normal Exam - Right-Upper Extremity Strength Normal and Lower Extremity Strength Normal.  Lymphatic Head & Neck  General Head & Neck Lymphatics: Bilateral - Description - Normal. Axillary  General  Axillary Region: Bilateral - Description - Normal. Tenderness - Non Tender. Femoral & Inguinal  Generalized Femoral & Inguinal Lymphatics: Bilateral - Description - Normal. Tenderness - Non Tender.    Assessment & Plan Eddie Dibbles S. 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 Over the last few months he has noticed some bulging near the incision of his abdominal wall. He appears to have at least 2 ventral hernias associated with the incision. Because of the risk of incarceration and strangulation think he would benefit from having this fixed. I have discussed with him in detail the risks and benefits of the operation as well as some of the technical aspects including the use of mesh and he understands and wishes to proceed. I think he would be a good laparoscopic candidate. He will contact his vascular surgeon for a recommendation on his blood thinner and then he will call back to schedule.     Signed by Luella Cook, MD (09/05/2014 4:37 PM)

## 2014-10-24 NOTE — Op Note (Signed)
10/24/2014  9:45 AM  PATIENT:  Nathan Rivera  69 y.o. male  PRE-OPERATIVE DIAGNOSIS:  Ventral Incisional Hernia  POST-OPERATIVE DIAGNOSIS:  Ventral Incisional Hernia  PROCEDURE:  Procedure(s): LAPAROSCOPIC VENTRAL HERNIA REPAIR WITH MESH (N/A) INSERTION OF MESH (N/A)  SURGEON:  Surgeon(s) and Role:    * Jovita Kussmaul, MD - Primary    * Alphonsa Overall, MD - Assisting  PHYSICIAN ASSISTANT:   ASSISTANTS: Dr. Lucia Gaskins   ANESTHESIA:   general  EBL:  Total I/O In: 1500 [I.V.:1500] Out: 190 [Urine:150; Blood:40]  BLOOD ADMINISTERED:none  DRAINS: none   LOCAL MEDICATIONS USED:  MARCAINE     SPECIMEN:  No Specimen  DISPOSITION OF SPECIMEN:  N/A  COUNTS:  YES  TOURNIQUET:  * No tourniquets in log *  DICTATION: .Dragon Dictation  After informed consent was obtained the patient was brought to the operating room and placed in the supine position on the operating room table. After adequate induction of general anesthesia the patient's abdomen was prepped with ChloraPrep, allowed to dry, and draped in usual sterile manner including the use of an Ioban drape. A site was chosen in the left upper quadrant for accessing the abdominal cavity. This area was infiltrated with quarter percent Marcaine. A small stab incision was made with 15 blade knife. A 5 mm Optiview port was used to dissect bluntly through the layers of the abdominal wall under direct vision until access was gained to the abdominal cavity. The abdomen was then insufflated with carbon dioxide without difficulty. A second 5 mm port was placed on the left lower abdomen under direct vision as well as a another 5 mm port on the right mid abdomen. The abdomen was inspected and there was some omental adhesions to the anterior abdominal wall. These were taken down sharply with the laparoscopic scissors and harmonic scalpel without difficulty. Some of the falciform ligament was also taken down sharply with the Harmonic scalpel. Once this  was accomplished the anterior abdominal wall was free of any adhesion. There were 2 circular hernia defects along the upper portion of the abdominal wall. On general inspection of the abdomen we also noted some diastases of the lower abdominal wall muscles along the midline as well as a right inguinal hernia defect. At this point the size of the hernia defects if they were joined was estimated using a spinal needle and ruler. I then estimated 4-5 cm of overlap all the way around the defects for placement of the mesh. He chose a 17 x 23 cm piece of ventral light mesh. 8 #1 Novafil stitches were placed at equidistant points around the edge of the mesh. The mesh was oriented with the coating towards the bowel. Next the patient's old scar and incision were excised sharply with a 10 blade knife and electrocautery. The hernia defects were opened and connected. The hernia sac was excised sharply with the electrocautery. Next the mesh was placed in the abdomen with the appropriate orientation. The fascia of the anterior abdominal wall was then closed with interrupted #1 Novafil stitches. The abdomen was then re-insufflated. 8 small stab incisions were made around the abdominal wall that corresponded to the sites of each stitch. A suture passer was placed through the abdominal wall at each of the sites to bring the tails of each stitch through the abdominal wall. Each of these stitches was then cinched down and tied. Once this was accomplished the mesh was in good apposition to the abdominal wall without any redundancy.  A secure strep tacker was then used to place absorbable tacks between each of the stitches so that no gaps were left. The abdomen was inspected again and was found to be hemostatic. The gas was allowed to escape and the mesh was observed to continue to be in good apposition to the abdominal wall. The ports were removed. The incision was then irrigated with copious amounts of saline. The subcutaneous tissue  was closed with a running 2-0 Vicryl stitch. All of the incisions were then closed with staples. Sterile dressings were applied. The patient tolerated the procedure well. At the end of the case all needle spongecounts were correct. The patient was then awakened and taken to recovery in stable condition.  PLAN OF CARE: Admit to inpatient   PATIENT DISPOSITION:  PACU - hemodynamically stable.   Delay start of Pharmacological VTE agent (>24hrs) due to surgical blood loss or risk of bleeding: no

## 2014-10-24 NOTE — Anesthesia Preprocedure Evaluation (Addendum)
Anesthesia Evaluation  Patient identified by MRN, date of birth, ID band Patient awake    Reviewed: Allergy & Precautions, NPO status , Patient's Chart, lab work & pertinent test results  History of Anesthesia Complications Negative for: history of anesthetic complications  Airway Mallampati: II  TM Distance: >3 FB Neck ROM: Full    Dental  (+) Teeth Intact, Dental Advisory Given   Pulmonary COPDCurrent Smoker,  breath sounds clear to auscultation        Cardiovascular hypertension (off of BP x 1 year), - angina+ Peripheral Vascular Disease (h/o AAA repair) and DVT Rhythm:Regular Rate:Normal     Neuro/Psych negative neurological ROS     GI/Hepatic PUD, GERD-  Medicated and Controlled,  Endo/Other    Renal/GU Renal InsufficiencyRenal disease (creat 2.12)     Musculoskeletal   Abdominal (+) + obese,   Peds  Hematology  (+) Blood dyscrasia (off xarelto for a week), ,   Anesthesia Other Findings   Reproductive/Obstetrics                            Anesthesia Physical Anesthesia Plan  ASA: III  Anesthesia Plan: General   Post-op Pain Management:    Induction: Intravenous  Airway Management Planned: Oral ETT  Additional Equipment:   Intra-op Plan:   Post-operative Plan: Extubation in OR  Informed Consent: I have reviewed the patients History and Physical, chart, labs and discussed the procedure including the risks, benefits and alternatives for the proposed anesthesia with the patient or authorized representative who has indicated his/her understanding and acceptance.   Dental advisory given  Plan Discussed with: CRNA and Surgeon  Anesthesia Plan Comments: (Plan routine monitors, GETA)        Anesthesia Quick Evaluation

## 2014-10-24 NOTE — Anesthesia Postprocedure Evaluation (Signed)
  Anesthesia Post-op Note  Patient: EDI GORNIAK  Procedure(s) Performed: Procedure(s): LAPAROSCOPIC ASSISTED VENTRAL HERNIA REPAIR WITH MESH (N/A) INSERTION OF MESH (N/A)  Patient Location: PACU  Anesthesia Type:General  Level of Consciousness: awake, alert , oriented and patient cooperative  Airway and Oxygen Therapy: Patient Spontanous Breathing and Patient connected to nasal cannula oxygen  Post-op Pain: mild  Post-op Assessment: Post-op Vital signs reviewed, Patient's Cardiovascular Status Stable, Respiratory Function Stable, Patent Airway, No signs of Nausea or vomiting and Pain level controlled  Post-op Vital Signs: Reviewed and stable  Last Vitals:  Filed Vitals:   10/24/14 1103  BP: 137/75  Pulse: 73  Temp: 36.4 C  Resp: 16    Complications: No apparent anesthesia complications

## 2014-10-25 LAB — CBC
HCT: 45.9 % (ref 39.0–52.0)
Hemoglobin: 14.9 g/dL (ref 13.0–17.0)
MCH: 30.3 pg (ref 26.0–34.0)
MCHC: 32.5 g/dL (ref 30.0–36.0)
MCV: 93.5 fL (ref 78.0–100.0)
PLATELETS: 143 10*3/uL — AB (ref 150–400)
RBC: 4.91 MIL/uL (ref 4.22–5.81)
RDW: 15.1 % (ref 11.5–15.5)
WBC: 12.1 10*3/uL — AB (ref 4.0–10.5)

## 2014-10-25 LAB — BASIC METABOLIC PANEL
ANION GAP: 2 — AB (ref 5–15)
BUN: 25 mg/dL — ABNORMAL HIGH (ref 6–23)
CHLORIDE: 110 mmol/L (ref 96–112)
CO2: 27 mmol/L (ref 19–32)
CREATININE: 1.93 mg/dL — AB (ref 0.50–1.35)
Calcium: 8.6 mg/dL (ref 8.4–10.5)
GFR calc Af Amer: 39 mL/min — ABNORMAL LOW (ref >90)
GFR calc non Af Amer: 34 mL/min — ABNORMAL LOW (ref >90)
Glucose, Bld: 122 mg/dL — ABNORMAL HIGH (ref 70–99)
Potassium: 4.9 mmol/L (ref 3.5–5.1)
Sodium: 139 mmol/L (ref 135–145)

## 2014-10-25 NOTE — Progress Notes (Signed)
1 Day Post-Op VHR Subjective: Doing well.  Feels sore.  No nausea.  Tolerating some liquids.  Had some trouble with binder yesterday but better now with bigger size.  Objective: Vital signs in last 24 hours: Temp:  [97.6 F (36.4 C)-98.5 F (36.9 C)] 98.3 F (36.8 C) (01/30 0646) Pulse Rate:  [73-89] 74 (01/30 0646) Resp:  [13-17] 16 (01/30 0646) BP: (109-169)/(64-93) 133/78 mmHg (01/30 0646) SpO2:  [90 %-99 %] 92 % (01/30 0646) Weight:  [236 lb 3.2 oz (107.14 kg)] 236 lb 3.2 oz (107.14 kg) (01/29 1103)   Intake/Output from previous day: 01/29 0701 - 01/30 0700 In: 3507 [P.O.:420; I.V.:3087] Out: 1640 [Urine:1600; Blood:40] Intake/Output this shift:     General appearance: alert and cooperative GI: normal findings: soft, appropriately tender  Incision: no significant drainage  Lab Results:   Recent Labs  10/25/14 0352  WBC 12.1*  HGB 14.9  HCT 45.9  PLT 143*   BMET  Recent Labs  10/25/14 0352  NA 139  K 4.9  CL 110  CO2 27  GLUCOSE 122*  BUN 25*  CREATININE 1.93*  CALCIUM 8.6   PT/INR No results for input(s): LABPROT, INR in the last 72 hours. ABG No results for input(s): PHART, HCO3 in the last 72 hours.  Invalid input(s): PCO2, PO2  MEDS, Scheduled . heparin subcutaneous  5,000 Units Subcutaneous 3 times per day  . pantoprazole (PROTONIX) IV  40 mg Intravenous Q24H  . rivaroxaban  20 mg Oral Q supper    Studies/Results: No results found.  Assessment: s/p Procedure(s): LAPAROSCOPIC ASSISTED VENTRAL HERNIA REPAIR WITH MESH INSERTION OF MESH Patient Active Problem List   Diagnosis Date Noted  . Ventral incisional hernia 10/24/2014  . Epididymitis 03/10/2014  . Pyelonephritis 02/24/2014  . Perforated gastric ulcer 09/20/2013  . Renal abscess 08/20/2013  . Acute respiratory failure with hypoxia 08/20/2013  . Dehydration 08/20/2013  . Bacteremia due to ESBL Escherichia coli 08/20/2013  . Severe sepsis 08/18/2013  . Acute renal failure  08/18/2013  . Hypotension, unspecified 08/18/2013  . Hydronephrosis, left 08/18/2013  . Nephrolithiasis 08/18/2013  . PVD (peripheral vascular disease) 08/29/2011  . Hypertension 08/29/2011  . Abnormal EKG 08/29/2011  . Preoperative cardiovascular examination 08/29/2011  . Hyperlipidemia 08/29/2011  . Malaise 08/29/2011    Doing well  Plan: d/c foley  Ambulate, Inc spirometry Will d/c once bowel function returns   LOS: 1 day     .Rosario Adie, MD [facility], [place]   10/25/2014 8:12 AM

## 2014-10-26 NOTE — Progress Notes (Signed)
2 Days Post-Op VHR Subjective: Doing ok.  Feels more sore and bloated today.  No nausea.  No flatus or BM.  Tolerating some liquids.    Objective: Vital signs in last 24 hours: Temp:  [98.1 F (36.7 C)-98.3 F (36.8 C)] 98.1 F (36.7 C) (01/31 0646) Pulse Rate:  [70-86] 86 (01/31 0646) Resp:  [17-19] 17 (01/31 0646) BP: (142-165)/(76-88) 165/88 mmHg (01/31 0646) SpO2:  [90 %] 90 % (01/31 0646)   Intake/Output from previous day: 01/30 0701 - 01/31 0700 In: 4008 [P.O.:820; I.V.:567] Out: 1800 [Urine:1800] Intake/Output this shift:    General appearance: alert and cooperative GI: normal findings: soft, appropriately tender  Incision: no significant drainage or erythema  Lab Results:   Recent Labs  10/25/14 0352  WBC 12.1*  HGB 14.9  HCT 45.9  PLT 143*   BMET  Recent Labs  10/25/14 0352  NA 139  K 4.9  CL 110  CO2 27  GLUCOSE 122*  BUN 25*  CREATININE 1.93*  CALCIUM 8.6   PT/INR No results for input(s): LABPROT, INR in the last 72 hours. ABG No results for input(s): PHART, HCO3 in the last 72 hours.  Invalid input(s): PCO2, PO2  MEDS, Scheduled . pantoprazole (PROTONIX) IV  40 mg Intravenous Q24H  . rivaroxaban  20 mg Oral Q supper    Studies/Results: No results found.  Assessment: s/p Procedure(s): LAPAROSCOPIC ASSISTED VENTRAL HERNIA REPAIR WITH MESH INSERTION OF MESH Patient Active Problem List   Diagnosis Date Noted  . Ventral incisional hernia 10/24/2014  . Epididymitis 03/10/2014  . Pyelonephritis 02/24/2014  . Perforated gastric ulcer 09/20/2013  . Renal abscess 08/20/2013  . Acute respiratory failure with hypoxia 08/20/2013  . Dehydration 08/20/2013  . Bacteremia due to ESBL Escherichia coli 08/20/2013  . Severe sepsis 08/18/2013  . Acute renal failure 08/18/2013  . Hypotension, unspecified 08/18/2013  . Hydronephrosis, left 08/18/2013  . Nephrolithiasis 08/18/2013  . PVD (peripheral vascular disease) 08/29/2011  .  Hypertension 08/29/2011  . Abnormal EKG 08/29/2011  . Preoperative cardiovascular examination 08/29/2011  . Hyperlipidemia 08/29/2011  . Malaise 08/29/2011    Doing well  Plan: Limit PO intake today until better bowel function Ambulate, Inc spirometry Will d/c once bowel function returns   LOS: 2 days     .Rosario Adie, [facility], [place]   10/26/2014 7:44 AM

## 2014-10-27 ENCOUNTER — Encounter (HOSPITAL_COMMUNITY): Payer: Self-pay | Admitting: General Surgery

## 2014-10-27 MED ORDER — PANTOPRAZOLE SODIUM 40 MG PO TBEC: 40.0000 mg | DELAYED_RELEASE_TABLET | Freq: Every day | ORAL | Status: DC

## 2014-10-27 MED ORDER — DOCUSATE SODIUM 100 MG PO CAPS
100.0000 mg | ORAL_CAPSULE | Freq: Two times a day (BID) | ORAL | Status: DC
  Administered 2014-10-27 (×2): 100 mg via ORAL
  Filled 2014-10-27 (×2): qty 1

## 2014-10-27 NOTE — Care Management Note (Signed)
  Page 1 of 1   10/27/2014     11:36:43 AM CARE MANAGEMENT NOTE 10/27/2014  Patient:  IRIS, TATSCH   Account Number:  [id number]  Date Initiated:  10/24/2014  Documentation initiated by:  Sandi Mariscal  Subjective/Objective Assessment:   lap ventral hernia repair     Action/Plan:   home when stable postop; no anticipated [place] needs unless RN for wound mgmt   Anticipated DC Date:  10/27/2014   Anticipated DC Plan:  HOME/SELF CARE         Choice offered to / List presented to:             Status of service:  In process, will continue to follow Medicare Important Message given?  YES (If response is "NO", the following Medicare IM given date fields will be blank) Date Medicare IM given:  10/27/2014 Medicare IM given by:  Magdalen Spatz Date Additional Medicare IM given:   Additional Medicare IM given by:    Discharge Disposition:  HOME/SELF CARE  Per UR Regulation:  Reviewed for med. necessity/level of care/duration of stay  If discussed at [place] of Stay Meetings, dates discussed:    Comments:

## 2014-10-27 NOTE — Progress Notes (Signed)
3 Days Post-Op  Subjective: Complains of pinching pain on right. Passing gas but no bm yet  Objective: Vital signs in last 24 hours: Temp:  [98.2 F (36.8 C)-98.5 F (36.9 C)] 98.2 F (36.8 C) (02/01 0620) Pulse Rate:  [74-77] 77 (02/01 0620) Resp:  [16-19] 19 (02/01 0620) BP: (138-152)/(81-84) 152/84 mmHg (02/01 0620) SpO2:  [90 %-91 %] 91 % (02/01 0620) Last BM Date: 10/23/14  Intake/Output from previous day: 01/31 0701 - 02/01 0700 In: 2024 [P.O.:960; I.V.:1064] Out: 2525 [Urine:2525] Intake/Output this shift: Total I/O In: -  Out: 300 [Urine:300]  Resp: clear to auscultation bilaterally Cardio: regular rate and rhythm GI: soft, mild tenderness on right. incisions look good. good bs  Lab Results:   Recent Labs  10/25/14 0352  WBC 12.1*  HGB 14.9  HCT 45.9  PLT 143*   BMET  Recent Labs  10/25/14 0352  NA 139  K 4.9  CL 110  CO2 27  GLUCOSE 122*  BUN 25*  CREATININE 1.93*  CALCIUM 8.6   PT/INR No results for input(s): LABPROT, INR in the last 72 hours. ABG No results for input(s): PHART, HCO3 in the last 72 hours.  Invalid input(s): PCO2, PO2  Studies/Results: No results found.  Anti-infectives: Anti-infectives    Start     Dose/Rate Route Frequency Ordered Stop   10/24/14 0600  ceFAZolin (ANCEF) IVPB 2 g/50 mL premix     2 g100 mL/hr over 30 Minutes Intravenous On call to O.R. 10/23/14 1412 10/24/14 0733      Assessment/Plan: s/p Procedure(s): LAPAROSCOPIC ASSISTED VENTRAL HERNIA REPAIR WITH MESH (N/A) INSERTION OF MESH (N/A) Advance diet  Add stool softener Hopefully ready for discharge tomorrow  LOS: 3 days    TOTH III,PAUL S 10/27/2014

## 2014-10-28 NOTE — Care Management (Signed)
10-28-14 IM from Medicare given. Mechell Girgis RN BSN  

## 2014-10-28 NOTE — Discharge Summary (Signed)
Physician Discharge Summary  Patient ID: Nathan Rivera MRN: [id number] DOB/AGE: [birth year] 69 y.o.  Admit date: 10/24/2014 Discharge date: 10/28/2014  Admission Diagnoses:  Discharge Diagnoses:  Active Problems:   Ventral incisional hernia   Discharged Condition: good  Hospital Course: the pt underwent a lap assisted ventral hernia repair. He tolerated surgery well. Postoperatively he developed an ileus which took a few days to resolve. Today he is ready for discharge home.  Consults: None  Significant Diagnostic Studies: none  Treatments: surgery: as above  Discharge Exam: Blood pressure 135/85, pulse 85, temperature 97.9 F (36.6 C), temperature source Oral, resp. rate 18, height 5\' 11"  (1.803 m), weight 236 lb 3.2 oz (107.14 kg), SpO2 95 %. GI: soft, minimal tenderness. incisions look good  Disposition: 06-Home-Health Care Svc  Discharge Instructions    Call MD for:  difficulty breathing, headache or visual disturbances    Complete by:  As directed      Call MD for:  extreme fatigue    Complete by:  As directed      Call MD for:  hives    Complete by:  As directed      Call MD for:  persistant dizziness or light-headedness    Complete by:  As directed      Call MD for:  persistant nausea and vomiting    Complete by:  As directed      Call MD for:  redness, tenderness, or signs of infection (pain, swelling, redness, odor or green/yellow discharge around incision site)    Complete by:  As directed      Call MD for:  severe uncontrolled pain    Complete by:  As directed      Call MD for:  temperature >100.4    Complete by:  As directed      Diet - low sodium heart healthy    Complete by:  As directed      Discharge instructions    Complete by:  As directed   May shower. Do not lift more than 5lbs. Use colace and miralax to avoid constipation. May walk     Increase activity slowly    Complete by:  As directed      No wound care    Complete by:  As directed              Medication List    TAKE these medications        amoxicillin-clavulanate 875-125 MG per tablet  Commonly known as:  AUGMENTIN  Take 1 tablet by mouth 2 (two) times daily.     cholecalciferol 1000 UNITS tablet  Commonly known as:  VITAMIN D  Take 1,000 Units by mouth daily.     ertapenem 1 g in sodium chloride 0.9 % 50 mL  Inject 1 g into the vein daily.     ferrous sulfate 325 (65 FE) MG tablet  Take 325 mg by mouth daily with breakfast.     pantoprazole 40 MG tablet  Commonly known as:  PROTONIX  Take 40 mg by mouth at bedtime.     simvastatin 20 MG tablet  Commonly known as:  ZOCOR  Take 20 mg by mouth at bedtime.     XARELTO 20 MG Tabs tablet  Generic drug:  rivaroxaban  Take 20 mg by mouth daily.           Follow-up Information    Follow up with Nathan Roof, MD In 1 week.   Specialty:  General Surgery   Contact information:   [address] [place] [address] [phone]       Signed: Merrie Rivera 10/28/2014, 8:38 AM

## 2014-10-28 NOTE — Progress Notes (Signed)
4 Days Post-Op  Subjective: No complaints  Objective: Vital signs in last 24 hours: Temp:  [97.9 F (36.6 C)-98.8 F (37.1 C)] 97.9 F (36.6 C) (02/02 0643) Pulse Rate:  [80-87] 85 (02/02 0643) Resp:  [18-20] 18 (02/02 0643) BP: (135-154)/(82-87) 135/85 mmHg (02/02 0643) SpO2:  [91 %-95 %] 95 % (02/02 0643) Last BM Date: 10/28/14  Intake/Output from previous day: 02/01 0701 - 02/02 0700 In: 305 [I.V.:305] Out: 1375 [Urine:1375] Intake/Output this shift:    Resp: clear to auscultation bilaterally Cardio: regular rate and rhythm GI: soft, mild tenderness. incisions look good  Lab Results:  No results for input(s): WBC, HGB, HCT, PLT in the last 72 hours. BMET No results for input(s): NA, K, CL, CO2, GLUCOSE, BUN, CREATININE, CALCIUM in the last 72 hours. PT/INR No results for input(s): LABPROT, INR in the last 72 hours. ABG No results for input(s): PHART, HCO3 in the last 72 hours.  Invalid input(s): PCO2, PO2  Studies/Results: No results found.  Anti-infectives: Anti-infectives    Start     Dose/Rate Route Frequency Ordered Stop   10/24/14 0600  ceFAZolin (ANCEF) IVPB 2 g/50 mL premix     2 g100 mL/hr over 30 Minutes Intravenous On call to O.R. 10/23/14 1412 10/24/14 0733      Assessment/Plan: s/p Procedure(s): LAPAROSCOPIC ASSISTED VENTRAL HERNIA REPAIR WITH MESH (N/A) INSERTION OF MESH (N/A) Discharge  LOS: 4 days    TOTH III,Jaidy Cottam S 10/28/2014

## 2014-10-28 NOTE — Discharge Planning (Signed)
Patient discharged home in stable condition. Verbalizes understanding of all discharge instructions, including home medications and follow up appointments. 

## 2015-01-16 NOTE — H&P (Signed)
PATIENT NAME:  Nathan Rivera, Nathan Rivera MR#:  [id number] DATE OF BIRTH:  May 30, 1946  DATE OF ADMISSION:  08/17/2013  REFERRING PHYSICIAN: Dr. Jimmye Norman  PRIMARY CARE PHYSICIAN: Dr. Ellison Hughs   CHIEF COMPLAINT: Altered mental status.  HISTORY OF PRESENT ILLNESS: A 69 year old Caucasian gentleman with history of bladder cancer, on BCG treatments, hypertension, hyperlipidemia, history of right-sided hydronephrosis, presenting with altered mental status. The patient has had a 15-day duration of hematuria, and was started on ciprofloxacin for presumptive UTI, which was then changed to Augmentin. Recently he has been on antibiotics for about 3 days in total. According to the wife he is more lethargic today, complaining of associated chills. However, denied any fever, abdominal pain, chest pain, palpitations, recent sick contacts, cough. On arrival to the Emergency Department, he was found to be hypotensive, blood pressure is 74/44. Unable to provide any further information. Of note, his daughter noticed just today that he was jaundiced, though the last time she saw him was approximately 2 weeks. Wife was unaware of this, as was the patient. In the Emergency Department, he received broad-spectrum antibiotics as well as 4 liters normal saline. He is now more alert and responsive.   REVIEW OF SYSTEMS: CONSTITUTIONAL: Denies fevers, fatigue, weakness, pain, weight loss.  EYES: Denies blurred vision, double vision, eye pain.  EARS, NOSE, THROAT: Denies tinnitus, ear pain, hearing loss.  RESPIRATORY: Denies cough, wheeze, shortness of breath.  CARDIOVASCULAR: Denies chest pain, palpitations, edema.  GASTROINTESTINAL: Denies nausea, vomiting, diarrhea, abdominal pain.  GENITOURINARY: Denies dysuria or change in urinary frequency. However, positive for hematuria.  ENDOCRINE: Denies nocturia or thyroid problems.  HEMATOLOGIC AND LYMPHATIC: Denies fevers or bleeding.  SKIN: Denies rashes or lesions.  MUSCULOSKELETAL:  Denies pain in neck, back, shoulder, knees, hips, arthritic symptoms.  NEUROLOGIC: Denies paralysis, paresthesias.  PSYCHIATRIC: Denies anxiety or depressive symptoms.  Otherwise, full review of systems performed by me is negative.   PAST MEDICAL HISTORY: Hypertension, nephrolithiasis, bladder cancer on BCG treatments, hyperlipidemia, history of ruptured AAA.   SOCIAL HISTORY: Occasional tobacco and alcohol usage. Denies any drug usage.   FAMILY HISTORY: Positive for CVA. No known kidney disease.   ALLERGIES: No known allergies.   HOME MEDICATIONS: Include Norvasc 5 mg p.o. daily. Azo urinary pain relief 97.5 mg 2 tabs 3 times daily as needed for pain. Etodolac 500 mg p.o. b.i.d. as needed for pain. Losartan 100 mg p.o. daily. Simvastatin 20 mg p.o. daily. Vitamin D3, 1000 international units p.o. daily. He is also on Augmentin 875/125, 1 tab by mouth twice daily.   PHYSICAL EXAMINATION: VITAL SIGNS: Temperature 97.5, heart rate on arrival 136, respirations 24, blood pressure 74/44, saturating 95% on room air. Currently, heart rate 102, blood pressure 101/50, weight 104.3 kg, BMI 32.1.  GENERAL:  A well-nourished, well-developed, Caucasian gentleman who is currently in no acute distress.  GENERAL: Well-nourished, well-developed, Caucasian gentleman who is a critical condition based on blood pressure status and mental status, but improving.  HEAD: Normocephalic, atraumatic.  EYES: Pupils equal, round, reactive to light. Extraocular muscles intact. Positive scleral icterus.  MOUTH: Dry mucosal membranes. Dentition intact. Positive sublingual jaundice. No abscesses noted.  EARS, NOSE, THROAT:  Throat clear, without exudates. No external lesions.  NECK: Supple. No thyromegaly. No nodules. No JVD.  PULMONARY: Clear to auscultation bilaterally. No wheezes, rales or rhonchi. No use of accessory muscles. Good respiratory effort.  CHEST: Nontender to palpation.  CARDIOVASCULAR: S1, S2, tachycardic.  No murmurs, rubs, or gallops. No edema. Pedal pulses 2+  bilaterally.  GASTROINTESTINAL: Soft, nontender, nondistended. No masses. Positive bowel sounds. No hepatosplenomegaly. He does have a small, easily reducible umbilical hernia.  MUSCULOSKELETAL: No swelling, clubbing, edema. Range of motion full in all extremities.  NEUROLOGIC: Cranial nerves II through XII intact. No gross motor deficits. Sensation intact. Reflexes intact.  SKIN: Markedly jaundiced. No ulcerations, lesions, rash or cyanosis. Skin warm, dry. Turgor intact.  PSYCHIATRIC: Mood and affect within normal limits. She is now awake, alert, oriented x 3. Insight and judgment intact.   LABORATORY DATA: Sodium 134, potassium 5.6, chloride 108, bicarbonate 13, BUN 135, creatinine 7.86, glucose 95. Total protein 6.5, albumin 2, bilirubin 1.3, alk phos 495, AST 85, ALT 70. WBC 27.8, hemoglobin 13, hematocrit 38.9, platelets of 182. Urinalysis:  WBC 1471, RBCs 895, leukocyte esterase positive, nitrite positive, epithelial cells none. Chest x-ray: No acute cardiopulmonary process.   ASSESSMENT AND PLAN: A 69 year old Caucasian gentleman with history of bladder cancer, hypertension, hyperlipidemia, presenting with altered mental status.   1.  Severe sepsis. Meets criteria by leukocytosis, heart rate, and encephalopathy, possible urinary versus biliary source. He has been pancultured, including blood and urine. Broad-spectrum antibiotics started. Vancomycin and cefepime, will continue both of these until culture data returns. Follow up culture data. IV fluids to keep mean arterial pressure greater than 65.   2.  Acute kidney injury. Consult Nephrology. The case has been discussed with Dr. Candiss Norse and by the Emergency Department staff. No acute indication for hemodialysis. Will follow renal function, including electrolytes. Will hold any further nephrotoxic agents, including ACE inhibitors. Will check urine for casts, urine sodium, and urine  creatinine.  3.  Anion gap metabolic acidosis secondary to elevated BUN. The patient currently has no uremic symptoms. Mental status has improved. Will continue to follow trend.   4.  Obstructive jaundice. Will check a right upper quadrant ultrasound. If unrevealing, will need abdominal CT, and follow up on repeat LFTs in the morning.   5.  Hypertension. Will hold all agents, given sepsis and hypotension.   6.  Hyperlipidemia. Continue with Zocor.   7.  Venous thrombosis prophylaxis with heparin subcutaneous.   The patient is FULL CODE.   Critical care time spent: 55 minutes.      ____________________________ Aaron Mose. Hower, MD dkh:mr D: 08/17/2013 22:21:20 ET T: 08/17/2013 22:49:09 ET JOB#: [id number]  cc: Aaron Mose. Hower, MD, <Dictator> DAVID Woodfin Ganja MD ELECTRONICALLY SIGNED 08/18/2013 2:44

## 2015-01-16 NOTE — Consult Note (Signed)
DATE OF ADMISSION:  08/17/2013 PHYSICIAN: Dr. Jimmye Norman Physician: Dr. Bridgette Habermann OF PRESENT ILLNESS: Mr. Vivas is  pleasant 69 year old Caucasian gentleman with history of a large left ureteral stone and hydronephrosis who is seen in consultation at the request of Dr. Nolon Lennert for evaluation and recommendations regarding this concnern. Mr. Gertner has a h/o bladder cancer and has undergone BCG treatments. He reports a 15-day duration of hematuria, and was started on ciprofloxacin for presumptive UTI, which was then changed to Augmentin per Dr. Dene Gentry office. He was admitted to the CCU with AMS after arrival to the Emergency Department, where he was found to be hypotensive, blood pressure is 74/44. In the Emergency Department, he received broad-spectrum antibiotics as well as 4 liters normal saline. He is now more alert and responsive. He denies any history of stone disease. HE has been having left flank tenderness. No dysuria. OF SYSTEMS:Denies fevers, fatigue, weakness, pain, weight loss. Denies blurred vision, double vision, eye pain. NOSE, THROAT: Denies tinnitus, ear pain, hearing loss. Denies cough, + dry mouth and wheezingDenies chest pain, palpitations, edema. Denies nausea, vomiting, diarrhea, abdominal pain. Denies dysuria or change in urinary frequency. However, positive for hematuria. Denies nocturia or thyroid problems. AND LYMPHATIC: Denies fevers or bleeding. Denies rashes or lesions. Denies pain in neck, back, shoulder, knees, hips, arthritic symptoms. Denies paralysis, paresthesias. Denies anxiety or depressive symptoms. full review of systems is negative.  MEDICAL HISTORY: Hypertension, nephrolithiasis, bladder cancer on BCG treatments, hyperlipidemia, history of ruptured AAA.  HISTORY: Occasional tobacco and alcohol usage. Denies any drug usage.  HISTORY: Positive for CVA. No known kidney disease.  No known allergies.  MEDICATIONS: Include Norvasc 5 mg p.o. daily. Azo urinary pain  relief 97.5 mg 2 tabs 3 times daily as needed for pain. Etodolac 500 mg p.o. b.i.d. as needed for pain. Losartan 100 mg p.o. daily. Simvastatin 20 mg p.o. daily. Vitamin D3, 1000 international units p.o. daily. He is also on Augmentin 875/125, 1 tab by mouth twice daily. currently on vancomycin and cefepime EXAMINATION:SIGNS: Temperature 97.5, heart rate on arrival 136, respirations 24, blood pressure 74/44, saturating 95% on room air. Currently, heart rate 92, blood pressure 111/60, weight 104.3 kg, BMI 32.1.  A well-nourished, well-developed, Caucasian gentleman who is currently in no acute distress. appears like he feels poorlyNormocephalic, atraumatic. Pupils equal, round, reactive to light. Extraocular muscles intact. Positive scleral icterus. Dry mucosal membranes. NOSE, THROAT:  No external lesions. Supple. No thyromegaly.Clear to auscultation bilaterally. + wheezeing. No use of accessory muscles. Good respiratory effort. RRR Soft, nontender, nondistended. No masses. Positive bowel sounds. No hepatosplenomegaly. He does have a small, easily reducible umbilical hernia. + left CVATpenis circumcised and without lesions, no masses in testiclesdeferredNo swelling, clubbing, edema. Range of motion full in all extremities. Cranial nerves II through XII intact. No gross motor deficits. Sensation intact. Reflexes intact. Markedly jaundiced. No ulcerations, lesions, rash or cyanosis. Skin warm, dry. Turgor intact. Mood and affect within normal limits, cooperative  DATA: 7.86 ---> 6.827 --> 43+ nitrites, 1+ LE, 895 rbcs/hpf, 1471 WBCs/hpfwith GNRs personally reviewed by me. CT scan shows 1.5cm prox to mid left ureteral stone with severe hydro and perinephric stranding. no right hydro.  AND PLAN: A 69 year old Caucasian gentleman with severe sepsis due to obstructing left ureteral stone I had a long discussion with the family and primary team about modes of treatment, either a stent or a nephrostomy tube. I feel  that given this patient's severe sepsis picture, significant leukocytosis, elev Cr, and large proximal  ureteral stone, he needs a nephrostomy tube. The risk of a stent would be inability to pass the stent or that it doesn't fully drain his urinary tract. Thus, I feel a nephrostomy tube is a more appropriate treatment. I talked with the IR team and the patient is going to be transferred to [facility] to get this done. I think at this point he is stable enough for this transfer, but he does need his procedure today. If his transfer is delayed, I would try a stent, but I discussed the risk with the patient and family that I may very well fail and/or make him more septic afterwards.would place foley catheter for maximal urinary drainagehe will need coags before neph tube placement primary team and family and they are in agreement   Electronic Signatures: Charna Archer (MD)  (Signed on 23-Nov-14 13:04)  Authored  Last Updated: 23-Nov-14 13:04 by Charna Archer (MD)

## 2015-01-16 NOTE — Discharge Summary (Signed)
PATIENT NAME:  Nathan Rivera, ROSKO MR#:  [id number] DATE OF BIRTH:  10-21-1945  DATE OF ADMISSION:  08/17/2013 DATE OF DISCHARGE:  08/18/2013  CONSULTANTS: Urology and nephrology.   PRIMARY CARE PHYSICIAN: Sofie Hartigan, MD  PRIMARY UROLOGIST: Scott C. Stoioff, MD   CHIEF COMPLAINT: Altered mental status.   CURRENT DIAGNOSES:  1.  Severe sepsis with leukocytosis, tachycardia and acute on chronic renal failure.  2.  Obstructive uropathy and septicemia with gram-negative rods in blood and urine, likely secondary to impacted nephrolithiasis on left causing severe hydronephrosis.  3.  Gram-negative rod septicemia.  4.  Transient hypotension, now resolved with IV fluids.  5.  Question of encephalopathy, likely metabolic from urinary tract infection, sepsis and renal failure, currently resolved.  6.  Hypertension.  7.  History of tobacco abuse.  8.  Acute on chronic anemia, likely secondary to dilution as well as ongoing hematuria.  9.  History of abdominal aortic aneurysm.  10.  History of nephrolithiasis in the past.  11.  History of bladder cancer, on bacille Calmette-Guerin treatments.  12.  History of hyperlipidemia.   CURRENT MEDICATIONS: Cefepime 1 gram q.24 hours, vitamin D3 1000 units daily, simvastatin 20 mg daily, heparin 5000 units q.8 hours, normal saline at 100 mL per hour, p.r.n. Zofran, morphine and Tylenol.   SIGNIFICANT LABS AND IMAGING: Initial BUN 135, creatinine 7.86, sodium 134, potassium 5.6, last creatinine of 6.81, last sodium 135, last potassium 5.2 last BUN of 127. Initial bilirubin of 1.3, alk phos 495, AST 85, ALT 70. Troponin on arrival negative. Initial white count of 27.8, last white count this morning of 43.6; initial hemoglobin 13 today of 10.3. Blood cultures from yesterday 6:00 p.m., 2 out of 2 bottles growing gram-negative rods. Urine cultures 20,000 CFU gram-negative rods. UA positive nitrates and leukocyte esterase, 895 RBCs, 1471 WBC, 1+ bacteria. VBG  showed pH of 7.35, pCO2 of 20, lactic acid 1.1. Chest x-ray, 1 view, yesterday showed no active cardiopulmonary disease. Ultrasound kidneys bilateral yesterday showing normal degree of bladder distention, left ureteral jet is not present, moderate left hydronephrosis with 1 cm stone in the proximal left ureter, mild right hydronephrosis. Ultrasound of abdomen, limited survey yesterday, showing cholelithiasis without sonographic evidence of acute cholecystitis. CAT scan abdomen and pelvis without contrast for stone today showing minimal cholelithiasis, small periumbilical hernia containing loops of small bowel but no resulting incarceration or obstruction, severe left hydronephrosis secondary to 15 x 7 mm stone in the middle portion of the left ureter.   HISTORY OF PRESENT ILLNESS AND HOSPITAL COURSE: For full details of H and P, please see the dictation on 11/22 by Dr. Lavetta Nielsen, but briefly this is a pleasant 69 year old who has been having issues with hematuria and a UTI as an outpatient and had treatment with Cipro and more recently on Augmentin, came into the hospital. According to wife, the patient was lethargic on day of admission with some chills, was hypotensive on arrival at  74/44. He had severe sepsis on admission and was given 4 liters of normal saline for stabilization of the blood pressure. The patient was more responsive. The patient became more responsive while in the ER with fluid resuscitation. Hospitalist services were contacted for further evaluation and admission. He was started on vanc and cefepime. The severe sepsis was initially thought to be secondary to urinary versus biliary source as the patient's family had also stated that he might have looked jaundiced. Ultrasound of the abdomen did not show any evidence  for obstruction in the gallbladder area but he was noted to have a 1 cm stone with hydronephrosis. As he did have hematuria and dirty urine on admission, a genitourinary source was  likely the source. The case was discussed with urology multiple times the following day, which is today, and ultimately, given the severe sepsis, it was decided to transfer the patient at [facility] as we do not have interventional radiology on board here for emergent nephrostomy. The patient appears to have severe renal failure, acute on chronic, with mild hypokalemia. Nephrology has seen him here. The patient also did have positive blood cultures with gram-negative rods. This is likely secondary to a genitourinary source. He has been maintained on cefepime. His white count has increased to low 40s today. He has no fever. His last pulse rate is 90 and blood pressure is 117/75. He is on room air. The case was discussed at [facility] critical care and internal medicine, who has graciously accepted the patient to their service for further care. Further care and management depending on hospital course at [facility]. Currently, he has encephalopathy which I believe is likely metabolic from renal failure, sepsis and UTI has resolved. He is awake, alert, oriented x 3. I doubt he has obstructive jaundice as he is not jaundiced to me him. He has had no obvious acute bleeds here. At this point, he will be discharged [facility].   TOTAL TIME SPENT ON SEEING THIS PATIENT AND ARRANGING TRANSFER: About 125 minutes or so.   ____________________________ Vivien Presto, MD sa:cs D: 08/18/2013 13:15:00 ET T: 08/18/2013 14:10:55 ET JOB#: [id number]  cc: Nicki Reaper C. Bernardo Heater, MD Sofie Hartigan, MD Vivien Presto, MD, <Dictator>  Vivien Presto MD ELECTRONICALLY SIGNED 09/02/2013 20:00

## 2015-01-18 NOTE — Op Note (Signed)
PATIENT NAME:  Nathan Rivera, Nathan Rivera MR#:  [id number] DATE OF BIRTH:  1945-10-20  DATE OF PROCEDURE:  09/07/2011  PREOPERATIVE DIAGNOSES:  1. Hematuria. 2. Right hydronephrosis.   POSTOPERATIVE DIAGNOSES:  1. Hematuria. 2. Right hydronephrosis.   PROCEDURES:  1. Cystoscopy. 2. Bilateral retrograde pyelograms.  3. Right ureteroscopy with ureteral biopsy.  4. Bladder biopsy.  5. Placement of right ureteral stent.   SURGEON: Daelin Giovanni, MD  ASSISTANT: None.   ANESTHESIA: General.  INDICATION:  This is a 69 year old white male with chronic kidney disease and on a recent screening ultrasound with Dr. Hortencia Pilar was found to have right hydronephrosis. He had noted intermittent gross hematuria. He could not see contrast secondary to chronic kidney disease and a noncontrast CT scan of the abdomen and pelvis demonstrated a nonobstructing left renal calculus. There was mild to moderate right hydronephrosis and hydroureter with no evidence of stone.   DESCRIPTION OF PROCEDURE: The patient was taken to the Operating Room where a general anesthetic was administered. He was placed in the low lithotomy position and his external genitalia were prepped and draped sterilely. A 21 French cystoscope with 30 degree lens was lubricated and passed under direct vision. The urethra was normal in caliber without stricture. The prostate shows minimal lateral lobe enlargement and moderate bladder neck elevation. The bladder mucosa was closely inspected and demonstrates no erythema, solid or papillary lesions. There was mild bladder trabeculation present. The ureteral orifices were normal appearing and clear efflux was noted bilaterally. An 8 [place] cone-tipped catheter was placed through the cystoscope and in the left ureteral orifice. A left retrograde pyelogram was performed which shows normal appearing ureter, renal pelvis and calyces without evidence of obstruction or filling defect. There was prompt drainage of  the contrast when observed on real-time fluoroscopy. The cone-tipped catheter was then placed in the right ureteral orifice and right retrograde pyelogram demonstrates a moderate right hydronephrosis and hydroureter to the distal ureter without evidence of stone or filling defect.   A 0.025 guidewire was placed through the cystoscope and into the right ureter and passed up the renal pelvis under fluoroscopic guidance. The cystoscope was removed and 6 French semirigid ureteroscope was lubricated and passed under direct vision. The right ureteral orifice was engaged without dilation. In the distal ureter was mild mucosal erythema and edema. However, the possibility of trauma from the cone-tipped catheter could not be excluded. This was noted within the first 5 to 10 mm of the distal ureter. Then the ureter was normal in appearance. The ureteroscope was able to be passed all the way up to the renal pelvis without abnormalities identified. Using BIGopsy forceps biopsies of the distal ureter were obtained. Additional cold cup biopsy of the bladder mucosa near the ureteral orifice were taken. There was noted to be mild erythema on the posterior wall and a cold cup biopsy was taken of this area. The bladder biopsy site was fulgurated with a Bugbee electrode. There was no significant bleeding noted from the ureteral biopsies. A 6 French/26 cm Bard inlay ureteral stent was placed. There was good curl seen in the renal pelvis and the distal end of the stent was well positioned in the bladder. The cystoscope was repassed to verify the distal position. The bladder was emptied and the cystoscope was removed. The patient was taken to the PAC-U in stable condition. There were no complications. EBL was minimal. ____________________________ Ronda Fairly. Bernardo Heater, MD scs:slb D: 09/08/2011 17:25:05 ET T: 09/08/2011 17:41:12 ET JOB#: [id number]  cc: Scott C. Bernardo Heater, MD, <Dictator> Abbie Sons MD ELECTRONICALLY SIGNED 10/12/2011  7:23

## 2015-01-18 NOTE — Op Note (Signed)
PATIENT NAME:  Nathan Rivera, Nathan Rivera MR#:  [id number] DATE OF BIRTH:  1946-05-08  DATE OF PROCEDURE:  01/18/2012  PREOPERATIVE DIAGNOSES:  1. 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  2. Right hydronephrosis.   POSTOPERATIVE DIAGNOSES:  1. 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  2. Right hydronephrosis.  3. Encrusted ureteral stent.   PROCEDURES:  1. Right ureteroscopy with electrohydraulic lithotripsy of retained encrusted ureteral stent.  2. Removal right ureteral stent.  3. Right retrograde pyelogram.  4. Bladder biopsy.   SURGEON: Scott C. Bernardo Heater, MD   ASSISTANT: None.   ANESTHETIC: General.   INDICATION: This is a 68 year old male with chronic kidney disease found to have right hydronephrosis back in November 2012 with intermittent gross hematuria. He underwent cystoscopy and retrograde pyelograms which did show moderate right hydronephrosis and hydroureter. No significant abnormalities were identified on ureteroscopy except for minimal erythema in the distal ureter. Biopsies of the ureter were negative. Biopsy of the bladder in the area of the ureteral orifice did return carcinoma in-situ. Ureteral stent was placed and he completed a six-week course of intravesical BCG. He presents for follow-up bladder biopsies and ureteroscopy.   DESCRIPTION OF PROCEDURE: The patient was taken to the operating room where general anesthetic was administered. He was placed in the low lithotomy position and his external genitalia were prepped and draped sterilely. A time-out was performed. A 21 French cystoscope was lubricated and passed under direct vision. No ureteral strictures were identified. There was minimal lateral lobe enlargement of the prostate with moderate bladder neck elevation. Panendoscopy was performed and no solid or papillary lesions were identified. There was mild erythema in the ascending right ureteral orifice with the indwelling stent. The curl  of the stent was encrusted. This was easily removed with grasping forceps and the stent was withdrawn as it does not appear the ureteral portion of the stent was encrusted. The proximal curl, however, was encrusted and would not advance beyond the distal ureter. A 6 French semi-rigid ureteroscope was lubricated and passed alongside the stent. The curl was easily identified. This calcification was removed with a 3 French electrohydraulic lithotripsy probe. The stent was then removed without difficulty. The ureteroscope was repassed and the calcified fragments were removed with a parachute basket. Prior to stent removal, the ureter was grossly inspected along the stent and there was no erythema or lesion. After stent removal, a 5 [place] open-ended ureteral catheter was placed into the right ureter. Retrograde pyelogram was remarkable for moderate hydronephrosis, hydroureter, however, the ureteral catheter was removed and there was prompt drainage of the collecting system and ureter in less than 30 seconds. It was elected not to replace the stent. Bladder biopsies x3 were taken in the vicinity of his previous positive bladder biopsy and fulgurated with a Bugbee electrode. The bladder was irrigated with light rosae effluent. A B and O suppository was placed per rectum. The patient was taken to the PAC-U in stable condition. There were no complications. EBL was minimal.   ____________________________ Ronda Fairly. Bernardo Heater, MD scs:drc D: 01/18/2012 09:20:52 ET T: 01/18/2012 09:47:44 ET JOB#: [id number]  cc: Nicki Reaper C. Bernardo Heater, MD, <Dictator> Abbie Sons MD ELECTRONICALLY SIGNED 01/19/2012 18:50

## 2016-03-18 ENCOUNTER — Ambulatory Visit
Admission: RE | Admit: 2016-03-18 | Discharge: 2016-03-18 | Disposition: A | Discharge status: Home / Self Care | Payer: Medicare Other | Source: Ambulatory Visit | Attending: Family Medicine | Admitting: Family Medicine

## 2016-03-18 ENCOUNTER — Other Ambulatory Visit: Payer: Self-pay | Admitting: Family Medicine

## 2016-03-18 ENCOUNTER — Ambulatory Visit: Payer: Medicare Other

## 2016-03-18 DIAGNOSIS — M79605 Pain in left leg: Secondary | ICD-10-CM | POA: Diagnosis present

## 2016-03-18 DIAGNOSIS — I82432 Acute embolism and thrombosis of left popliteal vein: Secondary | ICD-10-CM | POA: Diagnosis not present

## 2016-03-18 DIAGNOSIS — I82412 Acute embolism and thrombosis of left femoral vein: Secondary | ICD-10-CM | POA: Diagnosis not present

## 2017-03-24 ENCOUNTER — Other Ambulatory Visit (INDEPENDENT_AMBULATORY_CARE_PROVIDER_SITE_OTHER): Payer: Self-pay | Admitting: Vascular Surgery

## 2017-03-24 DIAGNOSIS — Z95828 Presence of other vascular implants and grafts: Secondary | ICD-10-CM

## 2017-04-03 ENCOUNTER — Encounter (INDEPENDENT_AMBULATORY_CARE_PROVIDER_SITE_OTHER): Payer: Self-pay | Admitting: Vascular Surgery

## 2017-04-03 ENCOUNTER — Ambulatory Visit (INDEPENDENT_AMBULATORY_CARE_PROVIDER_SITE_OTHER): Payer: Medicare Other

## 2017-04-03 ENCOUNTER — Ambulatory Visit (INDEPENDENT_AMBULATORY_CARE_PROVIDER_SITE_OTHER): Payer: Medicare Other | Admitting: Vascular Surgery

## 2017-04-03 VITALS — BP 150/85 | HR 69 | Resp 15 | Ht 71.0 in | Wt 240.0 lb

## 2017-04-03 DIAGNOSIS — Z95828 Presence of other vascular implants and grafts: Secondary | ICD-10-CM

## 2017-04-03 DIAGNOSIS — I713 Abdominal aortic aneurysm, ruptured, unspecified: Secondary | ICD-10-CM

## 2017-04-03 DIAGNOSIS — E782 Mixed hyperlipidemia: Secondary | ICD-10-CM

## 2017-04-03 DIAGNOSIS — I714 Abdominal aortic aneurysm, without rupture, unspecified: Secondary | ICD-10-CM | POA: Insufficient documentation

## 2017-04-03 DIAGNOSIS — I739 Peripheral vascular disease, unspecified: Secondary | ICD-10-CM

## 2017-04-03 DIAGNOSIS — I10 Essential (primary) hypertension: Secondary | ICD-10-CM

## 2017-04-03 NOTE — Progress Notes (Signed)
MRN : [id number]  Nathan Rivera is a 71 y.o. (07-May-1946) male who presents with chief complaint of  Chief Complaint  Patient presents with  . Re-evaluation    1 year follow up  .  History of Present Illness: The patient returns to the office for surveillance of an abdominal aortic aneurysm. Patient denies abdominal pain or back pain, no other abdominal complaints. He is s/p endovascular repair of a ruptured AAA in 2011.  He notes he has had several falls which has led to some bruising.  Patient denies amaurosis fugax or TIA symptoms. There is no history of claudication or rest pain symptoms of the lower extremities. The patient denies angina or shortness of breath.  Duplex US of the aorta and iliac arteries shows the AAA sac measures 3.6 x 3.5 possible small right iliac endoleak is noted stent is in good position with triphasic arterial flow. Previous study 03/28/2016 duplex US of the aorta and iliac arteries shows the AAA sac measures 3.6 x 3.5 no endoleak is noted stent is in good position with triphasic arterial flow  Current Meds  Medication Sig  . cholecalciferol (VITAMIN D) 1000 UNITS tablet Take 1,000 Units by mouth daily.  . ertapenem 1 g in sodium chloride 0.9 % 50 mL Inject 1 g into the vein daily.  Marland Kitchen FOLIC ACID PO Take by mouth.  . gabapentin (NEURONTIN) 100 MG capsule   . Lactobacillus ([employer] PROBIOTIC ACIDOPHILUS) 10 MG CAPS Take by mouth.  . pantoprazole (PROTONIX) 40 MG tablet Take 40 mg by mouth at bedtime.   . simvastatin (ZOCOR) 20 MG tablet Take 20 mg by mouth at bedtime.    Alveda Reasons 20 MG TABS tablet Take 20 mg by mouth daily.     Past Medical History:  Diagnosis Date  . Aortic aneurysm ([place])   . Arthritis    knees are the biggest problem  . Cancer [facility]) 2012   bladder  . CKD (chronic kidney disease), stage III 2014   relative to infection, septic- renal calculi  . Degenerative disc disease   . Degenerative joint disease   . GERD  (gastroesophageal reflux disease)   . Hypercholesterolemia   . Hypertension    no meds currently  . Muscle fatigue    muscle fatigue relative to sepsis experience   . Pneumonia   . Sepsis ([place]) Nov. 2014  . Thromboembolism of left popilteal vein 2015   pt. unsure which leg, treating with Xarelto  . UTI (urinary tract infection) 08/17/13   Lt nephrostomy tube and stent placement    Past Surgical History:  Procedure Laterality Date  . ABDOMINAL AORTIC ANEURYSM REPAIR  2011  . HERNIA REPAIR     54mo old  . INSERTION OF MESH N/A 10/24/2014   Procedure: INSERTION OF MESH;  Surgeon: Autumn Messing III, MD;  Location: [place];  Service: General;  Laterality: N/A;  . LAPAROTOMY N/A 08/24/2013   Procedure: EXPLORATORY LAPAROTOMY;  Surgeon: Merrie Roof, MD;  Location: [place];  Service: General;  Laterality: N/A;  Gram patch closure  . litrotripsy  09/24/2013  . NEPHROSTOMY  Nov. 2014  . nephrostomy removal  Dec. 2014  . PERIPHERALLY INSERTED CENTRAL CATHETER INSERTION    . TONSILLECTOMY    . UMBILICAL HERNIA REPAIR     50 yr ago  . VENTRAL HERNIA REPAIR N/A 10/24/2014   Procedure: LAPAROSCOPIC ASSISTED VENTRAL HERNIA REPAIR WITH MESH;  Surgeon: Autumn Messing III, MD;  Location: [place];  Service: General;  Laterality: N/A;    Social History Social History  Substance Use Topics  . Smoking status: Current Every Day Smoker    Packs/day: 1.00    Years: 40.00    Types: Cigarettes  . Smokeless tobacco: Never Used  . Alcohol use 1.8 oz/week    3 Glasses of wine per week    Family History Family History  Problem Relation Age of Onset  . CVA Father   . Atrial fibrillation Sister     No Known Allergies   REVIEW OF SYSTEMS (Negative unless checked)  Constitutional: [] Weight loss  [] Fever  [] Chills Cardiac: [] Chest pain   [] Chest pressure   [] Palpitations   [] Shortness of breath when laying flat   [] Shortness of breath with exertion. Vascular:  [x] Pain in legs with walking   [] Pain in legs  at rest  [] History of DVT   [] Phlebitis   [] Swelling in legs   [] Varicose veins   [] Non-healing ulcers Pulmonary:   [] Uses home oxygen   [] Productive cough   [] Hemoptysis   [] Wheeze  [] COPD   [] Asthma Neurologic:  [] Dizziness   [] Seizures   [] History of stroke   [] History of TIA  [] Aphasia   [] Vissual changes   [] Weakness or numbness in arm   [] Weakness or numbness in leg Musculoskeletal:   [] Joint swelling   [] Joint pain   [] Low back pain Hematologic:  [] Easy bruising  [] Easy bleeding   [] Hypercoagulable state   [] Anemic Gastrointestinal:  [] Diarrhea   [] Vomiting  [] Gastroesophageal reflux/heartburn   [] Difficulty swallowing. Genitourinary:  [] Chronic kidney disease   [] Difficult urination  [] Frequent urination   [] Blood in urine Skin:  [] Rashes   [] Ulcers  Psychological:  [] History of anxiety   []  History of major depression.  Physical Examination  Vitals:   04/03/17 0835  BP: (!) 150/85  Pulse: 69  Resp: 15  Weight: 108.9 kg (240 lb)  Height: 5\' 11"  (1.803 m)   Body mass index is 33.47 kg/m. Gen: WD/WN, NAD Head: [place]/AT, No temporalis wasting.  Ear/Nose/Throat: Hearing grossly intact, nares w/o erythema or drainage Eyes: PER, EOMI, sclera nonicteric.  Neck: Supple, no large masses.   Pulmonary:  Good air movement, no audible wheezing bilaterally, no use of accessory muscles.  Cardiac: RRR, no JVD Vascular:  Vessel Right Left  Radial Palpable Palpable  Ulnar Palpable Palpable  Brachial Palpable Palpable  Carotid Palpable Palpable  Femoral Palpable Palpable  Popliteal Not Palpable Not Palpable  PT Not Palpable Not Palpable  DP Not Palpable Not Palpable  Gastrointestinal: Non-distended. No guarding/no peritoneal signs.  Musculoskeletal: M/S 5/5 throughout.  No deformity or atrophy.  Neurologic: CN 2-12 intact. Symmetrical.  Speech is fluent. Motor exam as listed above. Psychiatric: Judgment intact, Mood & affect appropriate for pt's clinical situation. Dermatologic: No  rashes or ulcers noted.  No changes consistent with cellulitis. Lymph : No lichenification or skin changes of chronic lymphedema.  CBC Lab Results  Component Value Date   WBC 12.1 (H) 10/25/2014   HGB 14.9 10/25/2014   HCT 45.9 10/25/2014   MCV 93.5 10/25/2014   PLT 143 (L) 10/25/2014    BMET    Component Value Date/Time   NA 139 10/25/2014 0352   NA 144 10/23/2014 1630   K 4.9 10/25/2014 0352   K 4.1 10/23/2014 1630   CL 110 10/25/2014 0352   CL 112 (H) 10/23/2014 1630   CO2 27 10/25/2014 0352   CO2 26 10/23/2014 1630   GLUCOSE 122 (H) 10/25/2014 5284  GLUCOSE 83 10/23/2014 1630   BUN 25 (H) 10/25/2014 0352   BUN 30 (H) 10/23/2014 1630   CREATININE 1.93 (H) 10/25/2014 0352   CREATININE 1.96 (H) 10/23/2014 1630   CALCIUM 8.6 10/25/2014 0352   CALCIUM 9.0 10/23/2014 1630   GFRNONAA 34 (L) 10/25/2014 0352   GFRNONAA 36 (L) 10/23/2014 1630   GFRNONAA 8 (L) 08/18/2013 0504   GFRAA 39 (L) 10/25/2014 0352   GFRAA 44 (L) 10/23/2014 1630   GFRAA 9 (L) 08/18/2013 0504   CrCl cannot be calculated (Patient's most recent lab result is older than the maximum 21 days allowed.).  COAG Lab Results  Component Value Date   INR 1.5 10/20/2014   INR 1.16 08/18/2013    Radiology No results found.   Assessment/Plan 1. AAA (abdominal aortic aneurysm, ruptured) ([place]) Recommend: Patient is status post successful endovascular repair of the AAA.   No further intervention is required at this time.   No endoleak is detected and the aneurysm sac is stable.  The patient will continue antiplatelet therapy as prescribed as well as aggressive management of hyperlipidemia. Exercise is again strongly encouraged.   However, endografts require continued surveillance with ultrasound or CT scan. This is mandatory to detect any changes that allow repressurization of the aneurysm sac.  The patient is informed that this would be asymptomatic.  The patient is reminded that lifelong routine  surveillance is a necessity with an endograft. Patient will continue to follow-up at 6 month intervals with ultrasound of the aorta. - VAS US AORTA/IVC/ILIACS; Future  2. PVD (peripheral vascular disease) ([place])  Recommend:  The patient has evidence of atherosclerosis of the lower extremities with claudication.  The patient does not voice lifestyle limiting changes at this point in time.  Noninvasive studies do not suggest clinically significant change.  No invasive studies, angiography or surgery at this time The patient should continue walking and begin a more formal exercise program.  The patient should continue antiplatelet therapy and aggressive treatment of the lipid abnormalities  No changes in the patient's medications at this time  The patient should continue wearing graduated compression socks 10-15 mmHg strength to control the mild edema.   - VAS [place] ABI WITH/WO TBI; Future  3. Essential hypertension Continue antihypertensive medications as already ordered, these medications have been reviewed and there are no changes at this time.   4. Mixed hyperlipidemia Continue statin as ordered and reviewed, no changes at this time    Hortencia Pilar, MD  04/03/2017 8:55 AM

## 2018-04-02 ENCOUNTER — Encounter (INDEPENDENT_AMBULATORY_CARE_PROVIDER_SITE_OTHER): Payer: Medicare Other

## 2018-04-02 ENCOUNTER — Ambulatory Visit (INDEPENDENT_AMBULATORY_CARE_PROVIDER_SITE_OTHER): Payer: Medicare Other | Admitting: Vascular Surgery

## 2018-07-26 ENCOUNTER — Encounter (INDEPENDENT_AMBULATORY_CARE_PROVIDER_SITE_OTHER): Payer: Medicare Other

## 2018-07-26 ENCOUNTER — Ambulatory Visit (INDEPENDENT_AMBULATORY_CARE_PROVIDER_SITE_OTHER): Payer: Medicare Other | Admitting: Vascular Surgery

## 2018-08-20 ENCOUNTER — Encounter

## 2018-08-20 ENCOUNTER — Other Ambulatory Visit (INDEPENDENT_AMBULATORY_CARE_PROVIDER_SITE_OTHER): Payer: Self-pay | Admitting: Vascular Surgery

## 2018-08-20 ENCOUNTER — Encounter (INDEPENDENT_AMBULATORY_CARE_PROVIDER_SITE_OTHER): Payer: Self-pay | Admitting: Vascular Surgery

## 2018-08-20 ENCOUNTER — Ambulatory Visit (INDEPENDENT_AMBULATORY_CARE_PROVIDER_SITE_OTHER): Payer: Medicare Other

## 2018-08-20 ENCOUNTER — Ambulatory Visit (INDEPENDENT_AMBULATORY_CARE_PROVIDER_SITE_OTHER): Payer: Medicare Other | Admitting: Vascular Surgery

## 2018-08-20 VITALS — BP 145/91 | HR 73 | Resp 17 | Ht 71.0 in | Wt 236.0 lb

## 2018-08-20 DIAGNOSIS — I714 Abdominal aortic aneurysm, without rupture, unspecified: Secondary | ICD-10-CM

## 2018-08-20 DIAGNOSIS — I10 Essential (primary) hypertension: Secondary | ICD-10-CM | POA: Diagnosis not present

## 2018-08-20 DIAGNOSIS — Z789 Other specified health status: Secondary | ICD-10-CM

## 2018-08-20 DIAGNOSIS — Z95828 Presence of other vascular implants and grafts: Secondary | ICD-10-CM

## 2018-08-20 DIAGNOSIS — F17210 Nicotine dependence, cigarettes, uncomplicated: Secondary | ICD-10-CM

## 2018-08-20 DIAGNOSIS — E782 Mixed hyperlipidemia: Secondary | ICD-10-CM | POA: Diagnosis not present

## 2018-08-20 DIAGNOSIS — I739 Peripheral vascular disease, unspecified: Secondary | ICD-10-CM | POA: Diagnosis not present

## 2018-08-20 NOTE — Progress Notes (Signed)
MRN : [id number]  Nathan Rivera is a 72 y.o. (11-18-1945) male who presents with chief complaint of No chief complaint on file. Marland Kitchen  History of Present Illness:   The patient returns to the office for surveillance of an abdominal aortic aneurysm. Patient denies abdominal pain or back pain, no other abdominal complaints. He is s/p endovascular repair of a ruptured AAA in 2011.  He notes he has had several falls which has led to some bruising.  Patient denies amaurosis fugax or TIA symptoms. There is no history of claudication or rest pain symptoms of the lower extremities. The patient denies angina or shortness of breath.  Duplex US of the aorta and iliac arteries shows the AAA sac measures 3.6 x 3.5 possible small right iliac endoleak is noted stent is in good position with triphasic arterial flow. Previous study 03/28/2016 duplex US of the aorta and iliac arteries shows the AAA sac measures 3.6 x 3.5 no endoleak is noted stent is in good position with triphasic arterial flow  No outpatient medications have been marked as taking for the 08/20/18 encounter (Appointment) with Delana Meyer, Dolores Lory, MD.    Past Medical History:  Diagnosis Date  . Aortic aneurysm ([place])   . Arthritis    knees are the biggest problem  . Cancer [facility]) 2012   bladder  . CKD (chronic kidney disease), stage III 2014   relative to infection, septic- renal calculi  . Degenerative disc disease   . Degenerative joint disease   . GERD (gastroesophageal reflux disease)   . Hypercholesterolemia   . Hypertension    no meds currently  . Muscle fatigue    muscle fatigue relative to sepsis experience   . Pneumonia   . Sepsis ([place]) Nov. 2014  . Thromboembolism of left popilteal vein 2015   pt. unsure which leg, treating with Xarelto  . UTI (urinary tract infection) 08/17/13   Lt nephrostomy tube and stent placement    Past Surgical History:  Procedure Laterality Date  . ABDOMINAL AORTIC ANEURYSM  REPAIR  2011  . HERNIA REPAIR     64mo old  . INSERTION OF MESH N/A 10/24/2014   Procedure: INSERTION OF MESH;  Surgeon: Autumn Messing III, MD;  Location: [place];  Service: General;  Laterality: N/A;  . LAPAROTOMY N/A 08/24/2013   Procedure: EXPLORATORY LAPAROTOMY;  Surgeon: Merrie Roof, MD;  Location: [place];  Service: General;  Laterality: N/A;  Gram patch closure  . litrotripsy  09/24/2013  . NEPHROSTOMY  Nov. 2014  . nephrostomy removal  Dec. 2014  . PERIPHERALLY INSERTED CENTRAL CATHETER INSERTION    . TONSILLECTOMY    . UMBILICAL HERNIA REPAIR     50 yr ago  . VENTRAL HERNIA REPAIR N/A 10/24/2014   Procedure: LAPAROSCOPIC ASSISTED VENTRAL HERNIA REPAIR WITH MESH;  Surgeon: Autumn Messing III, MD;  Location: [place];  Service: General;  Laterality: N/A;    Social History Social History   Tobacco Use  . Smoking status: Current Every Day Smoker    Packs/day: 1.00    Years: 40.00    Pack years: 40.00    Types: Cigarettes  . Smokeless tobacco: Never Used  Substance Use Topics  . Alcohol use: Yes    Alcohol/week: 3.0 standard drinks    Types: 3 Glasses of wine per week  . Drug use: No    Family History Family History  Problem Relation Age of Onset  . CVA Father   . Atrial  fibrillation Sister     No Known Allergies   REVIEW OF SYSTEMS (Negative unless checked)  Constitutional: [] Weight loss  [] Fever  [] Chills Cardiac: [] Chest pain   [] Chest pressure   [] Palpitations   [] Shortness of breath when laying flat   [] Shortness of breath with exertion. Vascular:  [x] Pain in legs with walking   [] Pain in legs at rest  [] History of DVT   [] Phlebitis   [] Swelling in legs   [] Varicose veins   [] Non-healing ulcers Pulmonary:   [] Uses home oxygen   [] Productive cough   [] Hemoptysis   [] Wheeze  [] COPD   [] Asthma Neurologic:  [] Dizziness   [] Seizures   [] History of stroke   [] History of TIA  [] Aphasia   [] Vissual changes   [] Weakness or numbness in arm   [] Weakness or numbness in  leg Musculoskeletal:   [] Joint swelling   [] Joint pain   [] Low back pain Hematologic:  [] Easy bruising  [] Easy bleeding   [] Hypercoagulable state   [] Anemic Gastrointestinal:  [] Diarrhea   [] Vomiting  [] Gastroesophageal reflux/heartburn   [] Difficulty swallowing. Genitourinary:  [x] Chronic kidney disease   [] Difficult urination  [] Frequent urination   [] Blood in urine Skin:  [] Rashes   [] Ulcers  Psychological:  [] History of anxiety   []  History of major depression.  Physical Examination  There were no vitals filed for this visit. There is no height or weight on file to calculate BMI. Gen: WD/WN, NAD Head: [place]/AT, No temporalis wasting.  Ear/Nose/Throat: Hearing grossly intact, nares w/o erythema or drainage Eyes: PER, EOMI, sclera nonicteric.  Neck: Supple, no large masses.   Pulmonary:  Good air movement, no audible wheezing bilaterally, no use of accessory muscles.  Cardiac: RRR, no JVD Vascular:  Vessel Right Left  Radial Palpable Palpable  PT Palpable Palpable  DP Palpable Palpable  Gastrointestinal: Non-distended. No guarding/no peritoneal signs.  Musculoskeletal: M/S 5/5 throughout.  No deformity or atrophy.  Neurologic: CN 2-12 intact. Symmetrical.  Speech is fluent. Motor exam as listed above. Psychiatric: Judgment intact, Mood & affect appropriate for pt's clinical situation. Dermatologic: No rashes or ulcers noted.  No changes consistent with cellulitis. Lymph : No lichenification or skin changes of chronic lymphedema.  CBC Lab Results  Component Value Date   WBC 12.1 (H) 10/25/2014   HGB 14.9 10/25/2014   HCT 45.9 10/25/2014   MCV 93.5 10/25/2014   PLT 143 (L) 10/25/2014    BMET    Component Value Date/Time   NA 139 10/25/2014 0352   NA 144 10/23/2014 1630   K 4.9 10/25/2014 0352   K 4.1 10/23/2014 1630   CL 110 10/25/2014 0352   CL 112 (H) 10/23/2014 1630   CO2 27 10/25/2014 0352   CO2 26 10/23/2014 1630   GLUCOSE 122 (H) 10/25/2014 0352   GLUCOSE 83  10/23/2014 1630   BUN 25 (H) 10/25/2014 0352   BUN 30 (H) 10/23/2014 1630   CREATININE 1.93 (H) 10/25/2014 0352   CREATININE 1.96 (H) 10/23/2014 1630   CALCIUM 8.6 10/25/2014 0352   CALCIUM 9.0 10/23/2014 1630   GFRNONAA 34 (L) 10/25/2014 0352   GFRNONAA 36 (L) 10/23/2014 1630   GFRNONAA 8 (L) 08/18/2013 0504   GFRAA 39 (L) 10/25/2014 0352   GFRAA 44 (L) 10/23/2014 1630   GFRAA 9 (L) 08/18/2013 0504   CrCl cannot be calculated (Patient's most recent lab result is older than the maximum 21 days allowed.).  COAG Lab Results  Component Value Date   INR 1.5 10/20/2014   INR 1.16  08/18/2013    Radiology No results found.   Assessment/Plan 1. AAA (abdominal aortic aneurysm) without rupture ([facility]) Recommend: Patient is status post successful endovascular repair of the AAA.   No further intervention is required at this time.   No endoleak is detected and the aneurysm sac is stable.  The patient will continue antiplatelet therapy as prescribed as well as aggressive management of hyperlipidemia. Exercise is again strongly encouraged.   However, endografts require continued surveillance with ultrasound or CT scan. This is mandatory to detect any changes that allow repressurization of the aneurysm sac.  The patient is informed that this would be asymptomatic.  The patient is reminded that lifelong routine surveillance is a necessity with an endograft. Patient will continue to follow-up at 6 month intervals with ultrasound of the aorta. - VAS US AORTA/IVC/ILIACS; Future  2. PVD (peripheral vascular disease) ([place])  Recommend:  The patient has evidence of atherosclerosis of the lower extremities with claudication.  The patient does not voice lifestyle limiting changes at this point in time.  Noninvasive studies do not suggest clinically significant change.  No invasive studies, angiography or surgery at this time The patient should continue walking and begin a more formal exercise  program.  The patient should continue antiplatelet therapy and aggressive treatment of the lipid abnormalities  No changes in the patient's medications at this time  The patient should continue wearing graduated compression socks 10-15 mmHg strength to control the mild edema.   - VAS [place] ABI WITH/WO TBI; Future  3. Essential hypertension Continue antihypertensive medications as already ordered, these medications have been reviewed and there are no changes at this time.   4. Mixed hyperlipidemia Continue statin as ordered and reviewed, no changes at this time    Hortencia Pilar, MD  08/20/2018 8:24 AM

## 2018-12-12 ENCOUNTER — Telehealth (INDEPENDENT_AMBULATORY_CARE_PROVIDER_SITE_OTHER): Payer: Self-pay | Admitting: Vascular Surgery

## 2018-12-13 NOTE — Telephone Encounter (Signed)
Based on his description of symptoms, he should contact his PCP to evaluated and to have appropriate test ordered.   Based upon the description of symptoms this may not be vascular in nature.

## 2018-12-13 NOTE — Telephone Encounter (Signed)
I spoke with patient wife and inform Fallon(N.P) medical advise and she stated that he has been seen by Dr Inis Sizer and was giving 3 dosages of antibiotics and he was sent to Dr Reche Dixon and his office was sending a referral to our office but I inform her at this time we have not receive a referral.I advise her to call his office to have them fax the referral over

## 2018-12-17 ENCOUNTER — Other Ambulatory Visit: Payer: Self-pay

## 2018-12-17 ENCOUNTER — Ambulatory Visit (INDEPENDENT_AMBULATORY_CARE_PROVIDER_SITE_OTHER): Payer: Medicare Other | Admitting: Vascular Surgery

## 2018-12-17 ENCOUNTER — Other Ambulatory Visit (INDEPENDENT_AMBULATORY_CARE_PROVIDER_SITE_OTHER): Payer: Self-pay | Admitting: Vascular Surgery

## 2018-12-17 ENCOUNTER — Other Ambulatory Visit (INDEPENDENT_AMBULATORY_CARE_PROVIDER_SITE_OTHER): Payer: Medicare Other

## 2018-12-17 ENCOUNTER — Encounter (INDEPENDENT_AMBULATORY_CARE_PROVIDER_SITE_OTHER): Payer: Self-pay | Admitting: Vascular Surgery

## 2018-12-17 VITALS — BP 155/84 | HR 70 | Resp 16 | Ht 71.0 in | Wt 237.0 lb

## 2018-12-17 DIAGNOSIS — Z79899 Other long term (current) drug therapy: Secondary | ICD-10-CM

## 2018-12-17 DIAGNOSIS — L98491 Non-pressure chronic ulcer of skin of other sites limited to breakdown of skin: Secondary | ICD-10-CM | POA: Diagnosis not present

## 2018-12-17 DIAGNOSIS — S61203A Unspecified open wound of left middle finger without damage to nail, initial encounter: Secondary | ICD-10-CM

## 2018-12-17 DIAGNOSIS — I739 Peripheral vascular disease, unspecified: Secondary | ICD-10-CM | POA: Diagnosis not present

## 2018-12-17 DIAGNOSIS — I10 Essential (primary) hypertension: Secondary | ICD-10-CM

## 2018-12-17 DIAGNOSIS — I7301 Raynaud's syndrome with gangrene: Secondary | ICD-10-CM

## 2018-12-17 DIAGNOSIS — F17210 Nicotine dependence, cigarettes, uncomplicated: Secondary | ICD-10-CM

## 2018-12-17 DIAGNOSIS — I714 Abdominal aortic aneurysm, without rupture, unspecified: Secondary | ICD-10-CM

## 2018-12-17 DIAGNOSIS — L98499 Non-pressure chronic ulcer of skin of other sites with unspecified severity: Secondary | ICD-10-CM | POA: Insufficient documentation
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
## 2018-12-18 ENCOUNTER — Telehealth (INDEPENDENT_AMBULATORY_CARE_PROVIDER_SITE_OTHER): Payer: Self-pay | Admitting: Vascular Surgery

## 2018-12-18 ENCOUNTER — Other Ambulatory Visit (INDEPENDENT_AMBULATORY_CARE_PROVIDER_SITE_OTHER): Payer: Self-pay | Admitting: Nurse Practitioner

## 2018-12-18 MED ORDER — AMLODIPINE BESYLATE 5 MG PO TABS: 5.0000 mg | ORAL_TABLET | Freq: Every day | ORAL | 11 refills | Status: DC

## 2018-12-18 NOTE — Telephone Encounter (Signed)
Patient wife calling stating that patient was seen in our office on 12/17/18 and Schnier was supposed to call in medication to the pharmacy but no meds have been sent in. I looked in the patient chart but note from yesterday not complete. Please advise. AS, CMA

## 2018-12-18 NOTE — Telephone Encounter (Signed)
[facility] [address] [facility] the [employer].

## 2018-12-18 NOTE — Telephone Encounter (Signed)
I've sent it

## 2018-12-18 NOTE — Telephone Encounter (Signed)
Verify his pharmacy for me and I will send it in.  I spoke with Dr. Delana Meyer to verify what it was

## 2018-12-19 ENCOUNTER — Encounter (INDEPENDENT_AMBULATORY_CARE_PROVIDER_SITE_OTHER): Payer: Self-pay | Admitting: Vascular Surgery

## 2018-12-19 DIAGNOSIS — I7300 Raynaud's syndrome without gangrene: Secondary | ICD-10-CM | POA: Insufficient documentation

## 2019-01-24 ENCOUNTER — Other Ambulatory Visit (INDEPENDENT_AMBULATORY_CARE_PROVIDER_SITE_OTHER): Payer: Self-pay | Admitting: Nurse Practitioner

## 2019-08-26 ENCOUNTER — Ambulatory Visit (INDEPENDENT_AMBULATORY_CARE_PROVIDER_SITE_OTHER): Payer: Medicare Other | Admitting: Vascular Surgery

## 2019-08-26 ENCOUNTER — Encounter (INDEPENDENT_AMBULATORY_CARE_PROVIDER_SITE_OTHER): Payer: Medicare Other

## 2019-09-11 ENCOUNTER — Ambulatory Visit (INDEPENDENT_AMBULATORY_CARE_PROVIDER_SITE_OTHER): Payer: Medicare Other

## 2019-09-11 ENCOUNTER — Ambulatory Visit (INDEPENDENT_AMBULATORY_CARE_PROVIDER_SITE_OTHER): Payer: Medicare Other | Admitting: Nurse Practitioner

## 2019-09-11 ENCOUNTER — Other Ambulatory Visit: Payer: Self-pay

## 2019-09-11 ENCOUNTER — Encounter (INDEPENDENT_AMBULATORY_CARE_PROVIDER_SITE_OTHER): Payer: Self-pay | Admitting: Nurse Practitioner

## 2019-09-11 VITALS — BP 112/75 | HR 69 | Resp 16 | Wt 233.0 lb

## 2019-09-11 DIAGNOSIS — I714 Abdominal aortic aneurysm, without rupture, unspecified: Secondary | ICD-10-CM

## 2019-09-11 DIAGNOSIS — I10 Essential (primary) hypertension: Secondary | ICD-10-CM

## 2019-09-11 DIAGNOSIS — I739 Peripheral vascular disease, unspecified: Secondary | ICD-10-CM | POA: Diagnosis not present

## 2019-09-11 DIAGNOSIS — M1710 Unilateral primary osteoarthritis, unspecified knee: Secondary | ICD-10-CM | POA: Insufficient documentation

## 2019-09-11 DIAGNOSIS — E7800 Pure hypercholesterolemia, unspecified: Secondary | ICD-10-CM | POA: Insufficient documentation

## 2019-09-11 DIAGNOSIS — IMO0002 Reserved for concepts with insufficient information to code with codable children: Secondary | ICD-10-CM | POA: Insufficient documentation

## 2019-09-17 ENCOUNTER — Encounter (INDEPENDENT_AMBULATORY_CARE_PROVIDER_SITE_OTHER): Payer: Self-pay | Admitting: Nurse Practitioner

## 2019-09-17 NOTE — Progress Notes (Signed)
SUBJECTIVE:  Patient ID: Nathan Rivera, male    DOB: Jan 30, 1946, 73 y.o.   MRN: [id number] Chief Complaint  Patient presents with  . Follow-up    ultrasound follow up    HPI  Nathan Rivera is a 73 y.o. male The patient returns to the office for surveillance of an abdominal aortic aneurysm status post stent graft placement in 2011.Marland Kitchen   Patient denies abdominal pain or back pain, no other abdominal complaints. No groin related complaints. No symptoms consistent with distal embolization No changes in claudication distance.   There have been no interval changes in his overall healthcare since his last visit.   Patient denies amaurosis fugax or TIA symptoms. There is no history of claudication or rest pain symptoms of the lower extremities. The patient denies angina or shortness of breath.   Duplex US of the aorta and iliac arteries shows a 4.4 AAA sac with no endoleak, increase in the sac compared to the previous study which sowed 3.9cm.  The patient also underwent bilateral ABIs.  The right ABI is 1.23 with the left being 1.22.  The previous ABI on 08/20/2018, shows an ABI of 1.08 on the right 1.11 on the left.  The right lower extremity has biphasic waveforms within the tibial arteries and the left lower extremity has biphasic/triphasic waveforms within the tibial arteries..  Past Medical History:  Diagnosis Date  . Aortic aneurysm ([place])   . Arthritis    knees are the biggest problem  . Cancer [facility]) 2012   bladder  . CKD (chronic kidney disease), stage III 2014   relative to infection, septic- renal calculi  . Degenerative disc disease   . Degenerative joint disease   . GERD (gastroesophageal reflux disease)   . Hypercholesterolemia   . Hypertension    no meds currently  . Muscle fatigue    muscle fatigue relative to sepsis experience   . Pneumonia   . Sepsis ([place]) Nov. 2014  . Thromboembolism of left popilteal vein 2015   pt. unsure which leg, treating with Xarelto  .  UTI (urinary tract infection) 08/17/13   Lt nephrostomy tube and stent placement    Past Surgical History:  Procedure Laterality Date  . ABDOMINAL AORTIC ANEURYSM REPAIR  2011  . HERNIA REPAIR     77mo old  . INSERTION OF MESH N/A 10/24/2014   Procedure: INSERTION OF MESH;  Surgeon: Autumn Messing III, MD;  Location: [place];  Service: General;  Laterality: N/A;  . LAPAROTOMY N/A 08/24/2013   Procedure: EXPLORATORY LAPAROTOMY;  Surgeon: Merrie Roof, MD;  Location: [place];  Service: General;  Laterality: N/A;  Gram patch closure  . litrotripsy  09/24/2013  . NEPHROSTOMY  Nov. 2014  . nephrostomy removal  Dec. 2014  . PERIPHERALLY INSERTED CENTRAL CATHETER INSERTION    . TONSILLECTOMY    . UMBILICAL HERNIA REPAIR     50 yr ago  . VENTRAL HERNIA REPAIR N/A 10/24/2014   Procedure: LAPAROSCOPIC ASSISTED VENTRAL HERNIA REPAIR WITH MESH;  Surgeon: Autumn Messing III, MD;  Location: [place];  Service: General;  Laterality: N/A;    Social History   Socioeconomic History  . Marital status: Married    Spouse name: Not on file  . Number of children: Not on file  . Years of education: Not on file  . Highest education level: Not on file  Occupational History  . Not on file  Tobacco Use  . Smoking status: Current Every Day  Smoker    Packs/day: 1.00    Years: 40.00    Pack years: 40.00    Types: Cigarettes  . Smokeless tobacco: Never Used  Substance and Sexual Activity  . Alcohol use: Yes    Alcohol/week: 3.0 standard drinks    Types: 3 Glasses of wine per week  . Drug use: No  . Sexual activity: Not on file  Other Topics Concern  . Not on file  Social History Narrative  . Not on file   Social Determinants of Health   Financial Resource Strain:   . Difficulty of Paying Living Expenses: Not on file  Food Insecurity:   . Worried About Charity fundraiser in the Last Year: Not on file  . Ran Out of Food in the Last Year: Not on file  Transportation Needs:   . Lack of Transportation  (Medical): Not on file  . Lack of Transportation (Non-Medical): Not on file  Physical Activity:   . Days of Exercise per Week: Not on file  . Minutes of Exercise per Session: Not on file  Stress:   . Feeling of Stress : Not on file  Social Connections:   . Frequency of Communication with Friends and Family: Not on file  . Frequency of Social Gatherings with Friends and Family: Not on file  . Attends Religious Services: Not on file  . Active Member of Clubs or Organizations: Not on file  . Attends Archivist Meetings: Not on file  . Marital Status: Not on file  Intimate Partner Violence:   . Fear of Current or Ex-Partner: Not on file  . Emotionally Abused: Not on file  . Physically Abused: Not on file  . Sexually Abused: Not on file    Family History  Problem Relation Age of Onset  . CVA Father   . Atrial fibrillation Sister     No Known Allergies   Review of Systems   Review of Systems: Negative Unless Checked Constitutional: [] Weight loss  [] Fever  [] Chills Cardiac: [] Chest pain   []  Atrial Fibrillation  [] Palpitations   [] Shortness of breath when laying flat   [] Shortness of breath with exertion. [] Shortness of breath at rest Vascular:  [x] Pain in legs with walking   [] Pain in legs with standing [] Pain in legs when laying flat   [] Claudication    [] Pain in feet when laying flat    [] History of DVT   [] Phlebitis   [] Swelling in legs   [] Varicose veins   [] Non-healing ulcers Pulmonary:   [] Uses home oxygen   [] Productive cough   [] Hemoptysis   [] Wheeze  [] COPD   [] Asthma Neurologic:  [] Dizziness   [] Seizures  [] Blackouts [] History of stroke   [] History of TIA  [] Aphasia   [] Temporary Blindness   [] Weakness or numbness in arm   [] Weakness or numbness in leg Musculoskeletal:   [] Joint swelling   [] Joint pain   [] Low back pain  []  History of Knee Replacement [] Arthritis [] back Surgeries  []  Spinal Stenosis    Hematologic:  [] Easy bruising  [] Easy bleeding    [] Hypercoagulable state   [x] Anemic Gastrointestinal:  [] Diarrhea   [] Vomiting  [] Gastroesophageal reflux/heartburn   [] Difficulty swallowing. [] Abdominal pain Genitourinary:  [x] Chronic kidney disease   [] Difficult urination  [] Anuric   [] Blood in urine [] Frequent urination  [] Burning with urination   [] Hematuria Skin:  [] Rashes   [] Ulcers [] Wounds Psychological:  [] History of anxiety   []  History of major depression  []  Memory Difficulties  OBJECTIVE:   Physical Exam  BP 112/75 (BP Location: Right Arm)   Pulse 69   Resp 16   Wt 233 lb (105.7 kg)   BMI 32.50 kg/m   Gen: WD/WN, NAD Head: [place]/AT, No temporalis wasting.  Ear/Nose/Throat: Hearing grossly intact, nares w/o erythema or drainage Eyes: PER, EOMI, sclera nonicteric.  Neck: Supple, no masses.  No JVD.  Pulmonary:  Good air movement, no use of accessory muscles.  Cardiac: RRR Vascular:  Vessel Right Left  Radial Palpable Palpable  Dorsalis Pedis Palpable Palpable  Posterior Tibial Palpable Palpable   Gastrointestinal: soft, non-distended. No guarding/no peritoneal signs.  Musculoskeletal: M/S 5/5 throughout.  No deformity or atrophy.  Neurologic: Pain and light touch intact in extremities.  Symmetrical.  Speech is fluent. Motor exam as listed above. Psychiatric: Judgment intact, Mood & affect appropriate for pt's clinical situation. Dermatologic: No Venous rashes. No Ulcers Noted.  No changes consistent with cellulitis. Lymph : No Cervical lymphadenopathy, no lichenification or skin changes of chronic lymphedema.       ASSESSMENT AND PLAN:  1. AAA (abdominal aortic aneurysm) without rupture (HCC) Today the patient has a patent endovascular aneurysm repair with no evidence of endoleak however the patient's measurements of the old aneurysm sac has increased.  Based on this we will decrease his follow-up to 6 months.  If his aneurysm continues to grow we may consider CT scan to ensure there is no endoleak present.   However if it stays the same or grow smaller we will likely return to 1 year annual follow-ups. - VAS [place] EVAR DUPLEX; Future  2. PVD (peripheral vascular disease) ([place])  Recommend:  The patient has evidence of atherosclerosis of the lower extremities with claudication.  The patient does not voice lifestyle limiting changes at this point in time.  Noninvasive studies do not suggest clinically significant change.  No invasive studies, angiography or surgery at this time The patient should continue walking and begin a more formal exercise program.  The patient should continue antiplatelet therapy and aggressive treatment of the lipid abnormalities  No changes in the patient's medications at this time  The patient should continue wearing graduated compression socks 10-15 mmHg strength to control the mild edema.   - VAS [place] ABI WITH/WO TBI; Future  3. Essential hypertension Continue antihypertensive medications as already ordered, these medications have been reviewed and there are no changes at this time.    Current Outpatient Medications on File Prior to Visit  Medication Sig Dispense Refill  . amLODipine (NORVASC) 5 MG tablet Take 1 tablet (5 mg total) by mouth daily. 30 tablet 11  . cholecalciferol (VITAMIN D) 1000 UNITS tablet Take 1,000 Units by mouth daily.    . ertapenem 1 g in sodium chloride 0.9 % 50 mL Inject 1 g into the vein daily. 14 vial 0  . ferrous sulfate 325 (65 FE) MG tablet Take 325 mg by mouth daily with breakfast.    . FOLIC ACID PO Take by mouth.    . gabapentin (NEURONTIN) 100 MG capsule     . Lactobacillus ([employer]S PROBIOTIC ACIDOPHILUS) 10 MG CAPS Take by mouth.    . pantoprazole (PROTONIX) 40 MG tablet Take 40 mg by mouth at bedtime.     . simvastatin (ZOCOR) 20 MG tablet Take 20 mg by mouth at bedtime.      Alveda Reasons 20 MG TABS tablet daily.      No current facility-administered medications on file prior to visit.  There are no Patient Instructions on file  for this visit. No follow-ups on file.   Kris Hartmann, NP  This note was completed with Sales executive.  Any errors are purely unintentional.

## 2019-12-24 ENCOUNTER — Other Ambulatory Visit (INDEPENDENT_AMBULATORY_CARE_PROVIDER_SITE_OTHER): Payer: Self-pay | Admitting: Nurse Practitioner

## 2019-12-24 NOTE — Telephone Encounter (Signed)
Refill with 4 refills

## 2019-12-24 NOTE — Telephone Encounter (Signed)
Please Advise

## 2020-03-16 ENCOUNTER — Encounter (INDEPENDENT_AMBULATORY_CARE_PROVIDER_SITE_OTHER): Payer: Self-pay | Admitting: Vascular Surgery

## 2020-03-16 ENCOUNTER — Ambulatory Visit (INDEPENDENT_AMBULATORY_CARE_PROVIDER_SITE_OTHER): Payer: Medicare Other

## 2020-03-16 ENCOUNTER — Ambulatory Visit (INDEPENDENT_AMBULATORY_CARE_PROVIDER_SITE_OTHER): Payer: Medicare Other | Admitting: Vascular Surgery

## 2020-03-16 ENCOUNTER — Other Ambulatory Visit: Payer: Self-pay

## 2020-03-16 VITALS — BP 116/77 | HR 76 | Ht 71.0 in | Wt 229.0 lb

## 2020-03-16 DIAGNOSIS — I714 Abdominal aortic aneurysm, without rupture, unspecified: Secondary | ICD-10-CM

## 2020-03-16 DIAGNOSIS — I10 Essential (primary) hypertension: Secondary | ICD-10-CM | POA: Diagnosis not present

## 2020-03-16 DIAGNOSIS — I739 Peripheral vascular disease, unspecified: Secondary | ICD-10-CM

## 2020-03-16 DIAGNOSIS — E782 Mixed hyperlipidemia: Secondary | ICD-10-CM | POA: Diagnosis not present

## 2020-03-16 NOTE — Progress Notes (Signed)
MRN : [id number]  Nathan Rivera is a 74 y.o. (09/07/1946) male who presents with chief complaint of  Chief Complaint  Patient presents with  . Follow-up    U/S follow up  .  History of Present Illness:   The patient returns to the office for surveillance of an abdominal aortic aneurysm. Patient denies abdominal pain or back pain, no other abdominal complaints. He is s/p endovascular repair of a ruptured AAAin 2011.  He notes he has had several falls which has led to some bruising.  Patient denies amaurosis fugax or TIA symptoms. There is no history of claudication or rest pain symptoms of the lower extremities. The patient denies angina or shortness of breath.  Duplex US of the aorta and iliac arteries shows the AAA sac measures 3.9 cm which is decreased from 4.4.  No endoleak noted    Current Meds  Medication Sig  . amLODipine (NORVASC) 5 MG tablet TAKE 1 TABLET BY MOUTH  DAILY  . cholecalciferol (VITAMIN D) 1000 UNITS tablet Take 1,000 Units by mouth daily.  Marland Kitchen FOLIC ACID PO Take by mouth.  . gabapentin (NEURONTIN) 100 MG capsule   . Lactobacillus (CVS PROBIOTIC ACIDOPHILUS) 10 MG CAPS Take by mouth.  . pantoprazole (PROTONIX) 40 MG tablet Take 40 mg by mouth at bedtime.   . simvastatin (ZOCOR) 20 MG tablet Take 20 mg by mouth at bedtime.    Alveda Reasons 20 MG TABS tablet daily.     Past Medical History:  Diagnosis Date  . Aortic aneurysm ([place])   . Arthritis    knees are the biggest problem  . Cancer [facility]) 2012   bladder  . CKD (chronic kidney disease), stage III 2014   relative to infection, septic- renal calculi  . Degenerative disc disease   . Degenerative joint disease   . GERD (gastroesophageal reflux disease)   . Hypercholesterolemia   . Hypertension    no meds currently  . Muscle fatigue    muscle fatigue relative to sepsis experience   . Pneumonia   . Sepsis ([place]) Nov. 2014  . Thromboembolism of left popilteal vein 2015   pt. unsure which leg,  treating with Xarelto  . UTI (urinary tract infection) 08/17/13   Lt nephrostomy tube and stent placement    Past Surgical History:  Procedure Laterality Date  . ABDOMINAL AORTIC ANEURYSM REPAIR  2011  . HERNIA REPAIR     63mo old  . INSERTION OF MESH N/A 10/24/2014   Procedure: INSERTION OF MESH;  Surgeon: Autumn Messing III, MD;  Location: [place];  Service: General;  Laterality: N/A;  . LAPAROTOMY N/A 08/24/2013   Procedure: EXPLORATORY LAPAROTOMY;  Surgeon: Merrie Roof, MD;  Location: [place];  Service: General;  Laterality: N/A;  Gram patch closure  . litrotripsy  09/24/2013  . NEPHROSTOMY  Nov. 2014  . nephrostomy removal  Dec. 2014  . PERIPHERALLY INSERTED CENTRAL CATHETER INSERTION    . TONSILLECTOMY    . UMBILICAL HERNIA REPAIR     50 yr ago  . VENTRAL HERNIA REPAIR N/A 10/24/2014   Procedure: LAPAROSCOPIC ASSISTED VENTRAL HERNIA REPAIR WITH MESH;  Surgeon: Autumn Messing III, MD;  Location: [place];  Service: General;  Laterality: N/A;    Social History Social History   Tobacco Use  . Smoking status: Current Every Day Smoker    Packs/day: 1.00    Years: 40.00    Pack years: 40.00    Types: Cigarettes  .  Smokeless tobacco: Never Used  Substance Use Topics  . Alcohol use: Yes    Alcohol/week: 3.0 standard drinks    Types: 3 Glasses of wine per week  . Drug use: No    Family History Family History  Problem Relation Age of Onset  . CVA Father   . Atrial fibrillation Sister     No Known Allergies   REVIEW OF SYSTEMS (Negative unless checked)  Constitutional: [] Weight loss  [] Fever  [] Chills Cardiac: [] Chest pain   [] Chest pressure   [] Palpitations   [] Shortness of breath when laying flat   [] Shortness of breath with exertion. Vascular:  [] Pain in legs with walking   [] Pain in legs at rest  [] History of DVT   [] Phlebitis   [] Swelling in legs   [] Varicose veins   [] Non-healing ulcers Pulmonary:   [] Uses home oxygen   [] Productive cough   [] Hemoptysis   [] Wheeze  [] COPD    [] Asthma Neurologic:  [] Dizziness   [] Seizures   [] History of stroke   [] History of TIA  [] Aphasia   [] Vissual changes   [] Weakness or numbness in arm   [] Weakness or numbness in leg Musculoskeletal:   [] Joint swelling   [] Joint pain   [] Low back pain Hematologic:  [] Easy bruising  [] Easy bleeding   [] Hypercoagulable state   [] Anemic Gastrointestinal:  [] Diarrhea   [] Vomiting  [] Gastroesophageal reflux/heartburn   [] Difficulty swallowing. Genitourinary:  [] Chronic kidney disease   [] Difficult urination  [] Frequent urination   [] Blood in urine Skin:  [] Rashes   [] Ulcers  Psychological:  [] History of anxiety   []  History of major depression.  Physical Examination  Vitals:   03/16/20 0809  BP: 116/77  Pulse: 76  Weight: 229 lb (103.9 kg)  Height: 5\' 11"  (1.803 m)   Body mass index is 31.94 kg/m. Gen: WD/WN, NAD Head: [place]/AT, No temporalis wasting.  Ear/Nose/Throat: Hearing grossly intact, nares w/o erythema or drainage Eyes: PER, EOMI, sclera nonicteric.  Neck: Supple, no large masses.   Pulmonary:  Good air movement, no audible wheezing bilaterally, no use of accessory muscles.  Cardiac: RRR, no JVD Vascular:  Vessel Right Left  Radial Palpable Palpable  Gastrointestinal: Non-distended. No guarding/no peritoneal signs.  Musculoskeletal: M/S 5/5 throughout.  No deformity or atrophy.  Neurologic: CN 2-12 intact. Symmetrical.  Speech is fluent. Motor exam as listed above. Psychiatric: Judgment intact, Mood & affect appropriate for pt's clinical situation. Dermatologic: No rashes or ulcers noted.  No changes consistent with cellulitis.  CBC Lab Results  Component Value Date   WBC 12.1 (H) 10/25/2014   HGB 14.9 10/25/2014   HCT 45.9 10/25/2014   MCV 93.5 10/25/2014   PLT 143 (L) 10/25/2014    BMET    Component Value Date/Time   NA 139 10/25/2014 0352   NA 144 10/23/2014 1630   K 4.9 10/25/2014 0352   K 4.1 10/23/2014 1630   CL 110 10/25/2014 0352   CL 112 (H) 10/23/2014  1630   CO2 27 10/25/2014 0352   CO2 26 10/23/2014 1630   GLUCOSE 122 (H) 10/25/2014 0352   GLUCOSE 83 10/23/2014 1630   BUN 25 (H) 10/25/2014 0352   BUN 30 (H) 10/23/2014 1630   CREATININE 1.93 (H) 10/25/2014 0352   CREATININE 1.96 (H) 10/23/2014 1630   CALCIUM 8.6 10/25/2014 0352   CALCIUM 9.0 10/23/2014 1630   GFRNONAA 34 (L) 10/25/2014 0352   GFRNONAA 36 (L) 10/23/2014 1630   GFRNONAA 8 (L) 08/18/2013 0504   GFRAA 39 (L) 10/25/2014 5701  GFRAA 44 (L) 10/23/2014 1630   GFRAA 9 (L) 08/18/2013 0504   CrCl cannot be calculated (Patient's most recent lab result is older than the maximum 21 days allowed.).  COAG Lab Results  Component Value Date   INR 1.5 10/20/2014   INR 1.16 08/18/2013    Radiology No results found.   Assessment/Plan 1. AAA (abdominal aortic aneurysm) without rupture (HCC) Recommend: Patient is status post successful endovascular repair of the AAA.   No further intervention is required at this time.   No endoleak is detected and the aneurysm sac is stable.  The patient will continue antiplatelet therapy as prescribed as well as aggressive management of hyperlipidemia. Exercise is again strongly encouraged.   However, endografts require continued surveillance with ultrasound or CT scan. This is mandatory to detect any changes that allow repressurization of the aneurysm sac.  The patient is informed that this would be asymptomatic.  The patient is reminded that lifelong routine surveillance is a necessity with an endograft. Patient will continue to follow-up at 6 month intervals with ultrasound of the aorta. - VAS US AORTA/IVC/ILIACS; Future  2. PVD (peripheral vascular disease) ([place])  Recommend:  The patient has evidence of atherosclerosis of the lower extremities with claudication.  The patient does not voice lifestyle limiting changes at this point in time.  Noninvasive studies do not suggest clinically significant change.  No invasive studies,  angiography or surgery at this time The patient should continue walking and begin a more formal exercise program.  The patient should continue antiplatelet therapy and aggressive treatment of the lipid abnormalities  No changes in the patient's medications at this time  The patient should continue wearing graduated compression socks 10-15 mmHg strength to control the mild edema.    3. Essential hypertension Continue antihypertensive medications as already ordered, these medications have been reviewed and there are no changes at this time.   4. Mixed hyperlipidemia Continue statin as ordered and reviewed, no changes at this time    Hortencia Pilar, MD  03/16/2020 8:29 AM

## 2021-01-26 ENCOUNTER — Other Ambulatory Visit (INDEPENDENT_AMBULATORY_CARE_PROVIDER_SITE_OTHER): Payer: Self-pay | Admitting: Nurse Practitioner

## 2021-02-08 ENCOUNTER — Encounter: Payer: Self-pay | Admitting: Ophthalmology

## 2021-02-11 NOTE — Discharge Instructions (Signed)

## 2021-02-15 ENCOUNTER — Other Ambulatory Visit: Payer: Self-pay

## 2021-02-15 ENCOUNTER — Ambulatory Visit: Payer: Medicare Other | Admitting: Anesthesiology

## 2021-02-15 ENCOUNTER — Encounter
Admission: RE | Disposition: A | Discharge status: Home / Self Care | Payer: Self-pay | Source: Home / Self Care | Attending: Ophthalmology

## 2021-02-15 ENCOUNTER — Ambulatory Visit
Admission: RE | Admit: 2021-02-15 | Discharge: 2021-02-15 | Disposition: A | Discharge status: Home / Self Care | Payer: Medicare Other | Attending: Ophthalmology | Admitting: Ophthalmology

## 2021-02-15 ENCOUNTER — Encounter: Payer: Self-pay | Admitting: Ophthalmology

## 2021-02-15 DIAGNOSIS — E669 Obesity, unspecified: Secondary | ICD-10-CM | POA: Insufficient documentation

## 2021-02-15 DIAGNOSIS — Z7901 Long term (current) use of anticoagulants: Secondary | ICD-10-CM | POA: Insufficient documentation

## 2021-02-15 DIAGNOSIS — F17210 Nicotine dependence, cigarettes, uncomplicated: Secondary | ICD-10-CM | POA: Diagnosis not present

## 2021-02-15 DIAGNOSIS — Z79899 Other long term (current) drug therapy: Secondary | ICD-10-CM | POA: Insufficient documentation

## 2021-02-15 DIAGNOSIS — Z6831 Body mass index (BMI) 31.0-31.9, adult: Secondary | ICD-10-CM | POA: Diagnosis not present

## 2021-02-15 DIAGNOSIS — H2512 Age-related nuclear cataract, left eye: Secondary | ICD-10-CM | POA: Diagnosis not present

## 2021-02-15 HISTORY — DX: Impaired glucose tolerance (oral): R73.02

## 2021-02-15 HISTORY — DX: Raynaud's syndrome without gangrene: I73.00

## 2021-02-15 HISTORY — PX: CATARACT EXTRACTION W/PHACO: SHX586

## 2021-02-15 HISTORY — DX: Peripheral vascular disease, unspecified: I73.9

## 2021-02-15 SURGERY — PHACOEMULSIFICATION, CATARACT, WITH IOL INSERTION
Anesthesia: Monitor Anesthesia Care | Site: Eye | Laterality: Left

## 2021-02-15 MED ORDER — SODIUM HYALURONATE 10 MG/ML IO SOLUTION
PREFILLED_SYRINGE | INTRAOCULAR | Status: DC | PRN
  Administered 2021-02-15: 0.55 mL via INTRAOCULAR

## 2021-02-15 MED ORDER — ARMC OPHTHALMIC DILATING DROPS
1.0000 "application " | OPHTHALMIC | Status: DC | PRN
  Administered 2021-02-15 (×3): 1 via OPHTHALMIC

## 2021-02-15 MED ORDER — TETRACAINE HCL 0.5 % OP SOLN
1.0000 [drp] | OPHTHALMIC | Status: DC | PRN
  Administered 2021-02-15 (×3): 1 [drp] via OPHTHALMIC

## 2021-02-15 MED ORDER — MOXIFLOXACIN HCL 0.5 % OP SOLN
OPHTHALMIC | Status: DC | PRN
  Administered 2021-02-15: 0.2 mL via OPHTHALMIC

## 2021-02-15 MED ORDER — SODIUM HYALURONATE 23MG/ML IO SOSY
PREFILLED_SYRINGE | INTRAOCULAR | Status: DC | PRN
  Administered 2021-02-15: 0.6 mL via INTRAOCULAR

## 2021-02-15 MED ORDER — EPINEPHRINE PF 1 MG/ML IJ SOLN
INTRAOCULAR | Status: DC | PRN
  Administered 2021-02-15: 78 mL via OPHTHALMIC

## 2021-02-15 MED ORDER — FENTANYL CITRATE (PF) 100 MCG/2ML IJ SOLN
INTRAMUSCULAR | Status: DC | PRN
  Administered 2021-02-15: 25 ug via INTRAVENOUS
  Administered 2021-02-15: 50 ug via INTRAVENOUS

## 2021-02-15 MED ORDER — MIDAZOLAM HCL 2 MG/2ML IJ SOLN
INTRAMUSCULAR | Status: DC | PRN
  Administered 2021-02-15: 1 mg via INTRAVENOUS
  Administered 2021-02-15: .5 mg via INTRAVENOUS

## 2021-02-15 MED ORDER — LIDOCAINE HCL (PF) 2 % IJ SOLN
INTRAOCULAR | Status: DC | PRN
  Administered 2021-02-15: 1 mL via INTRAOCULAR

## 2021-02-15 SURGICAL SUPPLY — 17 items
CANNULA ANT/CHMB 27GA (MISCELLANEOUS) ×4 IMPLANT
DISSECTOR HYDRO NUCLEUS 50X22 (MISCELLANEOUS) ×2 IMPLANT
GLOVE PI ULTRA LF STRL 7.5 (GLOVE) ×1 IMPLANT
GLOVE PI ULTRA NON LATEX 7.5 (GLOVE) ×1
GLOVE SURG SYN 8.5  E (GLOVE) ×1
GLOVE SURG SYN 8.5 E (GLOVE) ×1 IMPLANT
GOWN STRL REUS W/ TWL LRG LVL3 (GOWN DISPOSABLE) ×2 IMPLANT
GOWN STRL REUS W/TWL LRG LVL3 (GOWN DISPOSABLE) ×4
LENS IOL TECNIS EYHANCE 22.0 (Intraocular Lens) ×2 IMPLANT
MARKER SKIN DUAL TIP RULER LAB (MISCELLANEOUS) ×2 IMPLANT
PACK DR. KING ARMS (PACKS) ×2 IMPLANT
PACK EYE AFTER SURG (MISCELLANEOUS) ×2 IMPLANT
PACK OPTHALMIC (MISCELLANEOUS) ×2 IMPLANT
SYR 3ML LL SCALE MARK (SYRINGE) ×2 IMPLANT
SYR TB 1ML LUER SLIP (SYRINGE) ×2 IMPLANT
WATER STERILE IRR 250ML POUR (IV SOLUTION) ×2 IMPLANT
WIPE NON LINTING 3.25X3.25 (MISCELLANEOUS) ×2 IMPLANT

## 2021-02-15 NOTE — H&P (Signed)
[facility]   Primary Care Physician:  Sofie Hartigan, MD Ophthalmologist: Dr. Benay Pillow  Pre-Procedure History & Physical: HPI:  Nathan Rivera is a 75 y.o. male here for cataract surgery.   Past Medical History:  Diagnosis Date  . Aortic aneurysm ([place])   . Arthritis    knees are the biggest problem  . Cancer [facility]) 2012   bladder  . CKD (chronic kidney disease), stage III ([place]) 2014   relative to infection, septic- renal calculi  . Degenerative disc disease   . Degenerative joint disease   . GERD (gastroesophageal reflux disease)   . Hypercholesterolemia   . Hypertension    no meds currently  . Impaired glucose tolerance   . Muscle fatigue    muscle fatigue relative to sepsis experience   . Peripheral vascular disease ([place])   . Pneumonia   . Raynaud's disease   . Sepsis ([place]) Nov. 2014  . Thromboembolism of left popilteal vein 2015   pt. unsure which leg, treating with Xarelto  . UTI (urinary tract infection) 08/17/13   Lt nephrostomy tube and stent placement    Past Surgical History:  Procedure Laterality Date  . ABDOMINAL AORTIC ANEURYSM REPAIR  2011  . HERNIA REPAIR     64mo old  . INSERTION OF MESH N/A 10/24/2014   Procedure: INSERTION OF MESH;  Surgeon: Autumn Messing III, MD;  Location: [place];  Service: General;  Laterality: N/A;  . LAPAROTOMY N/A 08/24/2013   Procedure: EXPLORATORY LAPAROTOMY;  Surgeon: Merrie Roof, MD;  Location: [place];  Service: General;  Laterality: N/A;  Gram patch closure  . litrotripsy  09/24/2013  . NEPHROSTOMY  Nov. 2014  . nephrostomy removal  Dec. 2014  . PERIPHERALLY INSERTED CENTRAL CATHETER INSERTION    . TONSILLECTOMY    . UMBILICAL HERNIA REPAIR     50 yr ago  . VENTRAL HERNIA REPAIR N/A 10/24/2014   Procedure: LAPAROSCOPIC ASSISTED VENTRAL HERNIA REPAIR WITH MESH;  Surgeon: Autumn Messing III, MD;  Location: [place];  Service: General;  Laterality: N/A;    Prior to Admission medications   Medication Sig Start Date  End Date Taking? Authorizing Provider  amLODipine (NORVASC) 5 MG tablet TAKE 1 TABLET BY MOUTH  DAILY 12/24/19  Yes Kris Hartmann, NP  cholecalciferol (VITAMIN D) 1000 UNITS tablet Take 1,000 Units by mouth daily.   Yes [provider]  FOLIC ACID PO Take by mouth.   Yes [provider]  gabapentin (NEURONTIN) 100 MG capsule  04/01/17  Yes [provider]  Lactobacillus (CVS PROBIOTIC ACIDOPHILUS) 10 MG CAPS Take by mouth.   Yes [provider]  pantoprazole (PROTONIX) 40 MG tablet Take 40 mg by mouth at bedtime.  11/13/13  Yes [provider]  simvastatin (ZOCOR) 20 MG tablet Take 20 mg by mouth at bedtime.   Yes [provider]  XARELTO 20 MG TABS tablet daily.  12/23/13  Yes [provider]  ertapenem 1 g in sodium chloride 0.9 % 50 mL Inject 1 g into the vein daily. Patient not taking: Reported on 03/16/2020 02/27/14   Ardis Hughs, MD  ferrous sulfate 325 (65 FE) MG tablet Take 325 mg by mouth daily with breakfast. Patient not taking: Reported on 03/16/2020    [provider]    Allergies as of 01/15/2021  . (No Known Allergies)    Family History  Problem Relation Age of Onset  . CVA Father   .  Atrial fibrillation Sister     Social History   Socioeconomic History  . Marital status: Married    Spouse name: Not on file  . Number of children: Not on file  . Years of education: Not on file  . Highest education level: Not on file  Occupational History  . Not on file  Tobacco Use  . Smoking status: Current Every Day Smoker    Packs/day: 1.00    Years: 40.00    Pack years: 40.00    Types: Cigarettes  . Smokeless tobacco: Never Used  . Tobacco comment: since age 28  Vaping Use  . Vaping Use: Never used  Substance and Sexual Activity  . Alcohol use: Yes    Alcohol/week: 3.0 standard drinks    Types: 3 Glasses of wine per week  . Drug use: No  . Sexual activity: Not on file  Other Topics Concern  .  Not on file  Social History Narrative  . Not on file   Social Determinants of Health   Financial Resource Strain: Not on file  Food Insecurity: Not on file  Transportation Needs: Not on file  Physical Activity: Not on file  Stress: Not on file  Social Connections: Not on file  Intimate Partner Violence: Not on file    Review of Systems: See HPI, otherwise negative ROS  Physical Exam: BP 130/86   Pulse 62   Temp 98.1 F (36.7 C) (Temporal)   Resp 20   Ht 5\' 10"  (1.778 m)   Wt 100.7 kg   SpO2 99%   BMI 31.85 kg/m  General:   Alert,  pleasant and cooperative in NAD Head:  Normocephalic and atraumatic. Respiratory:  Normal work of breathing. Cardiovascular:  RRR  Impression/Plan: Nathan Rivera is here for cataract surgery.  Risks, benefits, limitations, and alternatives regarding cataract surgery have been reviewed with the patient.  Questions have been answered.  All parties agreeable.   Benay Pillow, MD  02/15/2021, 12:51 PM

## 2021-02-15 NOTE — Op Note (Signed)
OPERATIVE NOTE  AULDEN CALISE [id number] 02/15/2021   PREOPERATIVE DIAGNOSIS:  Nuclear sclerotic cataract left eye.  H25.12   POSTOPERATIVE DIAGNOSIS:    Nuclear sclerotic cataract left eye.     PROCEDURE:  Phacoemusification with posterior chamber intraocular lens placement of the left eye   LENS:   Implant Name Type Inv. Item Serial No. Manufacturer Lot No. LRB No. Used Action  LENS IOL TECNIS EYHANCE 22.0 - [id number] Intraocular Lens LENS IOL TECNIS EYHANCE 22.0 [id number] JOHNSON   Left 1 Implanted      Procedure(s): CATARACT EXTRACTION PHACO AND INTRAOCULAR LENS PLACEMENT (IOC) LEFT 5.59 00:44.0 (Left)  DIB00 +22.0   SURGEON:  Benay Pillow, MD, MPH   ANESTHESIA:  Topical with tetracaine drops augmented with 1% preservative-free intracameral lidocaine.  ESTIMATED BLOOD LOSS: <1 mL   COMPLICATIONS:  None.   DESCRIPTION OF PROCEDURE:  The patient was identified in the holding room and transported to the operating room and placed in the supine position under the operating microscope.  The left eye was identified as the operative eye and it was prepped and draped in the usual sterile ophthalmic fashion.   A 1.0 millimeter clear-corneal paracentesis was made at the 5:00 position. 0.5 ml of preservative-free 1% lidocaine with epinephrine was injected into the anterior chamber.  The anterior chamber was filled with Healon 5 viscoelastic.  A 2.4 millimeter keratome was used to make a near-clear corneal incision at the 2:00 position.  A curvilinear capsulorrhexis was made with a cystotome and capsulorrhexis forceps.  Balanced salt solution was used to hydrodissect and hydrodelineate the nucleus.   Phacoemulsification was then used in stop and chop fashion to remove the lens nucleus and epinucleus.  The remaining cortex was then removed using the irrigation and aspiration handpiece. Healon was then placed into the capsular bag to distend it for lens placement.  A lens was then injected  into the capsular bag.  The remaining viscoelastic was aspirated.   Wounds were hydrated with balanced salt solution.  The anterior chamber was inflated to a physiologic pressure with balanced salt solution.  Intracameral vigamox 0.1 mL undiltued was injected into the eye and a drop placed onto the ocular surface.  No wound leaks were noted.  The patient was taken to the recovery room in stable condition without complications of anesthesia or surgery  Benay Pillow 02/15/2021, 1:24 PM

## 2021-02-15 NOTE — Anesthesia Postprocedure Evaluation (Signed)
Anesthesia Post Note  Patient: Nathan Rivera  Procedure(s) Performed: CATARACT EXTRACTION PHACO AND INTRAOCULAR LENS PLACEMENT (IOC) LEFT 5.59 00:44.0 (Left Eye)     Patient location during evaluation: PACU Anesthesia Type: MAC Level of consciousness: awake Pain management: pain level controlled Vital Signs Assessment: post-procedure vital signs reviewed and stable Respiratory status: respiratory function stable Cardiovascular status: stable Postop Assessment: no apparent nausea or vomiting Anesthetic complications: no   No complications documented.  Veda Canning

## 2021-02-15 NOTE — Anesthesia Procedure Notes (Signed)
Procedure Name: MAC Date/Time: 02/15/2021 1:00 PM Performed by: Vanetta Shawl, CRNA Pre-anesthesia Checklist: Patient identified, Emergency Drugs available, Suction available, Timeout performed and Patient being monitored Patient Re-evaluated:Patient Re-evaluated prior to induction Oxygen Delivery Method: Nasal cannula Placement Confirmation: positive ETCO2

## 2021-02-15 NOTE — Anesthesia Preprocedure Evaluation (Signed)
Anesthesia Evaluation  Patient identified by MRN, date of birth, ID band Patient awake    Reviewed: Allergy & Precautions, NPO status   Airway Mallampati: II  TM Distance: >3 FB     Dental   Pulmonary Current Smoker and Patient abstained from smoking.,    Pulmonary exam normal        Cardiovascular hypertension, + Peripheral Vascular Disease   Rhythm:Regular Rate:Normal  HLD Raynaud's   Neuro/Psych    GI/Hepatic PUD, GERD  ,  Endo/Other  Obesity - BMI > 30  Renal/GU Renal InsufficiencyRenal disease     Musculoskeletal  (+) Arthritis ,   Abdominal   Peds  Hematology   Anesthesia Other Findings   Reproductive/Obstetrics                             Anesthesia Physical Anesthesia Plan  ASA: III  Anesthesia Plan: MAC   Post-op Pain Management:    Induction: Intravenous  PONV Risk Score and Plan: TIVA, Midazolam and Treatment may vary due to age or medical condition  Airway Management Planned: Natural Airway and Nasal Cannula  Additional Equipment:   Intra-op Plan:   Post-operative Plan:   Informed Consent: I have reviewed the patients History and Physical, chart, labs and discussed the procedure including the risks, benefits and alternatives for the proposed anesthesia with the patient or authorized representative who has indicated his/her understanding and acceptance.       Plan Discussed with: CRNA  Anesthesia Plan Comments:         Anesthesia Quick Evaluation

## 2021-02-15 NOTE — Transfer of Care (Signed)
Immediate Anesthesia Transfer of Care Note  Patient: Nathan Rivera  Procedure(s) Performed: CATARACT EXTRACTION PHACO AND INTRAOCULAR LENS PLACEMENT (IOC) LEFT 5.59 00:44.0 (Left Eye)  Patient Location: PACU  Anesthesia Type: MAC  Level of Consciousness: awake, alert  and patient cooperative  Airway and Oxygen Therapy: Patient Spontanous Breathing and Patient connected to supplemental oxygen  Post-op Assessment: Post-op Vital signs reviewed, Patient's Cardiovascular Status Stable, Respiratory Function Stable, Patent Airway and No signs of Nausea or vomiting  Post-op Vital Signs: Reviewed and stable  Complications: No complications documented.

## 2021-02-16 ENCOUNTER — Encounter: Payer: Self-pay | Admitting: Ophthalmology

## 2021-02-24 ENCOUNTER — Encounter: Payer: Self-pay | Admitting: Ophthalmology

## 2021-03-08 ENCOUNTER — Encounter
Admission: RE | Disposition: A | Discharge status: Home / Self Care | Payer: Self-pay | Source: Home / Self Care | Attending: Ophthalmology

## 2021-03-08 ENCOUNTER — Encounter: Payer: Self-pay | Admitting: Ophthalmology

## 2021-03-08 ENCOUNTER — Ambulatory Visit: Payer: Medicare Other | Admitting: Anesthesiology

## 2021-03-08 ENCOUNTER — Ambulatory Visit
Admission: RE | Admit: 2021-03-08 | Discharge: 2021-03-08 | Disposition: A | Discharge status: Home / Self Care | Payer: Medicare Other | Attending: Ophthalmology | Admitting: Ophthalmology

## 2021-03-08 ENCOUNTER — Other Ambulatory Visit: Payer: Self-pay

## 2021-03-08 DIAGNOSIS — I7300 Raynaud's syndrome without gangrene: Secondary | ICD-10-CM | POA: Diagnosis not present

## 2021-03-08 DIAGNOSIS — I129 Hypertensive chronic kidney disease with stage 1 through stage 4 chronic kidney disease, or unspecified chronic kidney disease: Secondary | ICD-10-CM | POA: Insufficient documentation

## 2021-03-08 DIAGNOSIS — E7800 Pure hypercholesterolemia, unspecified: Secondary | ICD-10-CM | POA: Insufficient documentation

## 2021-03-08 DIAGNOSIS — Z8249 Family history of ischemic heart disease and other diseases of the circulatory system: Secondary | ICD-10-CM | POA: Insufficient documentation

## 2021-03-08 DIAGNOSIS — N183 Chronic kidney disease, stage 3 unspecified: Secondary | ICD-10-CM | POA: Insufficient documentation

## 2021-03-08 DIAGNOSIS — K219 Gastro-esophageal reflux disease without esophagitis: Secondary | ICD-10-CM | POA: Diagnosis not present

## 2021-03-08 DIAGNOSIS — Z86718 Personal history of other venous thrombosis and embolism: Secondary | ICD-10-CM | POA: Insufficient documentation

## 2021-03-08 DIAGNOSIS — H2511 Age-related nuclear cataract, right eye: Secondary | ICD-10-CM | POA: Diagnosis present

## 2021-03-08 HISTORY — PX: CATARACT EXTRACTION W/PHACO: SHX586

## 2021-03-08 SURGERY — PHACOEMULSIFICATION, CATARACT, WITH IOL INSERTION
Anesthesia: Monitor Anesthesia Care | Site: Eye | Laterality: Right

## 2021-03-08 MED ORDER — ACETAMINOPHEN 160 MG/5ML PO SOLN: 325.0000 mg | ORAL | Status: DC | PRN

## 2021-03-08 MED ORDER — FENTANYL CITRATE (PF) 100 MCG/2ML IJ SOLN
INTRAMUSCULAR | Status: DC | PRN
  Administered 2021-03-08: 75 ug via INTRAVENOUS

## 2021-03-08 MED ORDER — ARMC OPHTHALMIC DILATING DROPS
1.0000 "application " | OPHTHALMIC | Status: DC | PRN
  Administered 2021-03-08 (×3): 1 via OPHTHALMIC

## 2021-03-08 MED ORDER — SODIUM HYALURONATE 10 MG/ML IO SOLUTION
PREFILLED_SYRINGE | INTRAOCULAR | Status: DC | PRN
  Administered 2021-03-08: 0.4 mL via INTRAOCULAR

## 2021-03-08 MED ORDER — TETRACAINE HCL 0.5 % OP SOLN
1.0000 [drp] | OPHTHALMIC | Status: DC | PRN
  Administered 2021-03-08 (×3): 1 [drp] via OPHTHALMIC

## 2021-03-08 MED ORDER — LIDOCAINE HCL (PF) 2 % IJ SOLN
INTRAOCULAR | Status: DC | PRN
  Administered 2021-03-08: 4 mL via INTRAOCULAR

## 2021-03-08 MED ORDER — EPINEPHRINE PF 1 MG/ML IJ SOLN
INTRAOCULAR | Status: DC | PRN
  Administered 2021-03-08: 69 mL via OPHTHALMIC

## 2021-03-08 MED ORDER — ONDANSETRON HCL 4 MG/2ML IJ SOLN: 4.0000 mg | Freq: Once | INTRAMUSCULAR | Status: DC | PRN

## 2021-03-08 MED ORDER — SODIUM HYALURONATE 23MG/ML IO SOSY
PREFILLED_SYRINGE | INTRAOCULAR | Status: DC | PRN
  Administered 2021-03-08: 0.6 mL via INTRAOCULAR

## 2021-03-08 MED ORDER — ACETAMINOPHEN 325 MG PO TABS: 325.0000 mg | ORAL_TABLET | ORAL | Status: DC | PRN

## 2021-03-08 MED ORDER — MIDAZOLAM HCL 2 MG/2ML IJ SOLN
INTRAMUSCULAR | Status: DC | PRN
  Administered 2021-03-08: 2 mg via INTRAVENOUS

## 2021-03-08 MED ORDER — MOXIFLOXACIN HCL 0.5 % OP SOLN
OPHTHALMIC | Status: DC | PRN
  Administered 2021-03-08: 0.2 mL via OPHTHALMIC

## 2021-03-08 SURGICAL SUPPLY — 16 items
CANNULA ANT/CHMB 27GA (MISCELLANEOUS) ×6 IMPLANT
DISSECTOR HYDRO NUCLEUS 50X22 (MISCELLANEOUS) ×3 IMPLANT
GLOVE ORTHO TXT STRL SZ7.5 (GLOVE) ×3 IMPLANT
GLOVE SURG SYN 8.5  E (GLOVE) ×2
GLOVE SURG SYN 8.5 E (GLOVE) ×1 IMPLANT
GOWN STRL REUS W/ TWL LRG LVL3 (GOWN DISPOSABLE) ×2 IMPLANT
GOWN STRL REUS W/TWL LRG LVL3 (GOWN DISPOSABLE) ×6
LENS IOL TECNIS EYHANCE 21.5 (Intraocular Lens) ×3 IMPLANT
MARKER SKIN DUAL TIP RULER LAB (MISCELLANEOUS) ×3 IMPLANT
PACK DR. KING ARMS (PACKS) ×3 IMPLANT
PACK EYE AFTER SURG (MISCELLANEOUS) ×3 IMPLANT
PACK OPTHALMIC (MISCELLANEOUS) ×3 IMPLANT
SYR 3ML LL SCALE MARK (SYRINGE) ×3 IMPLANT
SYR TB 1ML LUER SLIP (SYRINGE) ×3 IMPLANT
WATER STERILE IRR 250ML POUR (IV SOLUTION) ×3 IMPLANT
WIPE NON LINTING 3.25X3.25 (MISCELLANEOUS) ×3 IMPLANT

## 2021-03-08 NOTE — Op Note (Signed)
OPERATIVE NOTE  Nathan Rivera [id number] 03/08/2021   PREOPERATIVE DIAGNOSIS:  Nuclear sclerotic cataract right eye.  H25.11   POSTOPERATIVE DIAGNOSIS:    Nuclear sclerotic cataract right eye.     PROCEDURE:  Phacoemusification with posterior chamber intraocular lens placement of the right eye   LENS:   Implant Name Type Inv. Item Serial No. Manufacturer Lot No. LRB No. Used Action  LENS IOL TECNIS EYHANCE 21.5 - [id number] Intraocular Lens LENS IOL TECNIS EYHANCE 21.5 [id number] JOHNSON   Right 1 Implanted       Procedure(s): CATARACT EXTRACTION PHACO AND INTRAOCULAR LENS PLACEMENT (IOC) RIGHT 1.81 00:22.7 (Right)  DIB00 +21.5   SURGEON:  Benay Pillow, MD, MPH  ANESTHESIOLOGIST: Anesthesiologist: Veda Canning, MD CRNA: Silvana Newness, CRNA   ANESTHESIA:  Topical with tetracaine drops augmented with 1% preservative-free intracameral lidocaine.  ESTIMATED BLOOD LOSS: less than 1 mL.   COMPLICATIONS:  None.   DESCRIPTION OF PROCEDURE:  The patient was identified in the holding room and transported to the operating room and placed in the supine position under the operating microscope.  The right eye was identified as the operative eye and it was prepped and draped in the usual sterile ophthalmic fashion.   A 1.0 millimeter clear-corneal paracentesis was made at the 10:30 position. 0.5 ml of preservative-free 1% lidocaine with epinephrine was injected into the anterior chamber.  The anterior chamber was filled with Healon 5 viscoelastic.  A 2.4 millimeter keratome was used to make a near-clear corneal incision at the 8:00 position.  A curvilinear capsulorrhexis was made with a cystotome and capsulorrhexis forceps.  Balanced salt solution was used to hydrodissect and hydrodelineate the nucleus.   Phacoemulsification was then used in stop and chop fashion to remove the lens nucleus and epinucleus.  The remaining cortex was then removed using the irrigation and aspiration handpiece.  Healon was then placed into the capsular bag to distend it for lens placement.  A lens was then injected into the capsular bag.  The remaining viscoelastic was aspirated.   Wounds were hydrated with balanced salt solution.  The anterior chamber was inflated to a physiologic pressure with balanced salt solution.   Intracameral vigamox 0.1 mL undiluted was injected into the eye and a drop placed onto the ocular surface.  No wound leaks were noted.  The patient was taken to the recovery room in stable condition without complications of anesthesia or surgery  Benay Pillow 03/08/2021, 9:43 AM

## 2021-03-08 NOTE — Transfer of Care (Signed)
Immediate Anesthesia Transfer of Care Note  Patient: Nathan Rivera  Procedure(s) Performed: CATARACT EXTRACTION PHACO AND INTRAOCULAR LENS PLACEMENT (IOC) RIGHT 1.81 00:22.7 (Right: Eye)  Patient Location: PACU  Anesthesia Type: MAC  Level of Consciousness: awake, alert  and patient cooperative  Airway and Oxygen Therapy: Patient Spontanous Breathing and Patient connected to supplemental oxygen  Post-op Assessment: Post-op Vital signs reviewed, Patient's Cardiovascular Status Stable, Respiratory Function Stable, Patent Airway and No signs of Nausea or vomiting  Post-op Vital Signs: Reviewed and stable  Complications: No notable events documented.

## 2021-03-08 NOTE — H&P (Signed)
[facility]   Primary Care Physician:  Sofie Hartigan, MD Ophthalmologist: Dr. Benay Pillow  Pre-Procedure History & Physical: HPI:  Nathan Rivera is a 75 y.o. male here for cataract surgery.   Past Medical History:  Diagnosis Date   Aortic aneurysm (HCC)    Arthritis    knees are the biggest problem   Cancer [facility]) 2012   bladder   CKD (chronic kidney disease), stage III ([place]) 2014   relative to infection, septic- renal calculi   Degenerative disc disease    Degenerative joint disease    GERD (gastroesophageal reflux disease)    Hypercholesterolemia    Hypertension    no meds currently   Impaired glucose tolerance    Muscle fatigue    muscle fatigue relative to sepsis experience    Peripheral vascular disease ([place])    Pneumonia    Raynaud's disease    Sepsis ([place]) Nov. 2014   Thromboembolism of left popilteal vein 2015   pt. unsure which leg, treating with Xarelto   UTI (urinary tract infection) 08/17/13   Lt nephrostomy tube and stent placement    Past Surgical History:  Procedure Laterality Date   ABDOMINAL AORTIC ANEURYSM REPAIR  2011   CATARACT EXTRACTION W/PHACO Left 02/15/2021   Procedure: CATARACT EXTRACTION PHACO AND INTRAOCULAR LENS PLACEMENT (IOC) LEFT 5.59 00:44.0;  Surgeon: Eulogio Bear, MD;  Location: [place];  Service: Ophthalmology;  Laterality: Left;   HERNIA REPAIR     58mo old   INSERTION OF MESH N/A 10/24/2014   Procedure: INSERTION OF MESH;  Surgeon: Autumn Messing III, MD;  Location: [place];  Service: General;  Laterality: N/A;   LAPAROTOMY N/A 08/24/2013   Procedure: EXPLORATORY LAPAROTOMY;  Surgeon: Merrie Roof, MD;  Location: [place];  Service: General;  Laterality: N/A;  Gram patch closure   litrotripsy  09/24/2013   NEPHROSTOMY  Nov. 2014   nephrostomy removal  Dec. 2014   PERIPHERALLY INSERTED CENTRAL CATHETER INSERTION     TONSILLECTOMY     UMBILICAL HERNIA REPAIR     29 yr ago   [place] N/A  10/24/2014   Procedure: LAPAROSCOPIC ASSISTED VENTRAL HERNIA REPAIR WITH MESH;  Surgeon: Autumn Messing III, MD;  Location: [place];  Service: General;  Laterality: N/A;    Prior to Admission medications   Medication Sig Start Date End Date Taking? Authorizing Provider  amLODipine (NORVASC) 5 MG tablet TAKE 1 TABLET BY MOUTH  DAILY 12/24/19  Yes Kris Hartmann, NP  cholecalciferol (VITAMIN D) 1000 UNITS tablet Take 1,000 Units by mouth daily.   Yes [provider]  FOLIC ACID PO Take by mouth.   Yes [provider]  gabapentin (NEURONTIN) 100 MG capsule  04/01/17  Yes [provider]  Lactobacillus ([employer]S PROBIOTIC ACIDOPHILUS) 10 MG CAPS Take by mouth.   Yes [provider]  pantoprazole (PROTONIX) 40 MG tablet Take 40 mg by mouth at bedtime.  11/13/13  Yes [provider]  simvastatin (ZOCOR) 20 MG tablet Take 20 mg by mouth at bedtime.   Yes [provider]  XARELTO 20 MG TABS tablet daily.  12/23/13  Yes [provider]  ertapenem 1 g in sodium chloride 0.9 % 50 mL Inject 1 g into the vein daily. Patient not taking: Reported on 03/16/2020 02/27/14   Ardis Hughs, MD  ferrous sulfate 325 (65 FE) MG tablet Take 325 mg by mouth daily with breakfast. Patient not taking:  Reported on 03/16/2020    [provider]    Allergies as of 01/15/2021   (No Known Allergies)    Family History  Problem Relation Age of Onset   CVA Father    Atrial fibrillation Sister     Social History   Socioeconomic History   Marital status: Married    Spouse name: Not on file   Number of children: Not on file   Years of education: Not on file   Highest education level: Not on file  Occupational History   Not on file  Tobacco Use   Smoking status: Every Day    Packs/day: 1.00    Years: 40.00    Pack years: 40.00    Types: Cigarettes   Smokeless tobacco: Never   Tobacco comments:    since age 71  Vaping Use   Vaping Use: Never used   Substance and Sexual Activity   Alcohol use: Yes    Alcohol/week: 3.0 standard drinks    Types: 3 Glasses of wine per week   Drug use: No   Sexual activity: Not on file  Other Topics Concern   Not on file  Social History Narrative   Not on file   Social Determinants of Health   Financial Resource Strain: Not on file  Food Insecurity: Not on file  Transportation Needs: Not on file  Physical Activity: Not on file  Stress: Not on file  Social Connections: Not on file  Intimate Partner Violence: Not on file    Review of Systems: See HPI, otherwise negative ROS  Physical Exam: BP 118/83   Pulse 63   Temp 97.7 F (36.5 C) (Temporal)   Resp 15   Ht 5\' 10"  (1.778 m)   Wt 100.7 kg   SpO2 97%   BMI 31.85 kg/m  General:   Alert,  pleasant and cooperative in NAD Head:  Normocephalic and atraumatic. Respiratory:  Normal work of breathing. Cardiovascular:  RRR  Impression/Plan: Nathan Rivera is here for cataract surgery.  Risks, benefits, limitations, and alternatives regarding cataract surgery have been reviewed with the patient.  Questions have been answered.  All parties agreeable.   Benay Pillow, MD  03/08/2021, 9:07 AM

## 2021-03-08 NOTE — Anesthesia Postprocedure Evaluation (Signed)
Anesthesia Post Note  Patient: Nathan Rivera  Procedure(s) Performed: CATARACT EXTRACTION PHACO AND INTRAOCULAR LENS PLACEMENT (IOC) RIGHT 1.81 00:22.7 (Right: Eye)     Patient location during evaluation: PACU Anesthesia Type: MAC Level of consciousness: awake Pain management: pain level controlled Vital Signs Assessment: post-procedure vital signs reviewed and stable Respiratory status: respiratory function stable Cardiovascular status: stable Postop Assessment: no apparent nausea or vomiting Anesthetic complications: no   No notable events documented.  Veda Canning

## 2021-03-08 NOTE — Anesthesia Preprocedure Evaluation (Signed)
Anesthesia Evaluation  Patient identified by MRN, date of birth, ID band Patient awake    Reviewed: Allergy & Precautions, NPO status   Airway Mallampati: II  TM Distance: >3 FB     Dental   Pulmonary Current Smoker and Patient abstained from smoking.,    Pulmonary exam normal        Cardiovascular hypertension, + Peripheral Vascular Disease   Rhythm:Regular Rate:Normal  HLD Raynaud's   Neuro/Psych    GI/Hepatic PUD, GERD  ,  Endo/Other  Obesity - BMI > 30  Renal/GU Renal InsufficiencyRenal disease     Musculoskeletal  (+) Arthritis ,   Abdominal   Peds  Hematology   Anesthesia Other Findings   Reproductive/Obstetrics                             Anesthesia Physical  Anesthesia Plan  ASA: 3  Anesthesia Plan: MAC   Post-op Pain Management:    Induction: Intravenous  PONV Risk Score and Plan: TIVA, Midazolam and Treatment may vary due to age or medical condition  Airway Management Planned: Natural Airway and Nasal Cannula  Additional Equipment:   Intra-op Plan:   Post-operative Plan:   Informed Consent: I have reviewed the patients History and Physical, chart, labs and discussed the procedure including the risks, benefits and alternatives for the proposed anesthesia with the patient or authorized representative who has indicated his/her understanding and acceptance.       Plan Discussed with: CRNA  Anesthesia Plan Comments:         Anesthesia Quick Evaluation

## 2021-03-09 ENCOUNTER — Encounter: Payer: Self-pay | Admitting: Ophthalmology

## 2021-03-15 ENCOUNTER — Other Ambulatory Visit: Payer: Self-pay

## 2021-03-15 ENCOUNTER — Ambulatory Visit (INDEPENDENT_AMBULATORY_CARE_PROVIDER_SITE_OTHER): Payer: Medicare Other

## 2021-03-15 ENCOUNTER — Encounter (INDEPENDENT_AMBULATORY_CARE_PROVIDER_SITE_OTHER): Payer: Self-pay | Admitting: Vascular Surgery

## 2021-03-15 ENCOUNTER — Ambulatory Visit (INDEPENDENT_AMBULATORY_CARE_PROVIDER_SITE_OTHER): Payer: Medicare Other | Admitting: Vascular Surgery

## 2021-03-15 VITALS — BP 130/74 | HR 67 | Resp 16 | Wt 222.0 lb

## 2021-03-15 DIAGNOSIS — I10 Essential (primary) hypertension: Secondary | ICD-10-CM | POA: Diagnosis not present

## 2021-03-15 DIAGNOSIS — N183 Chronic kidney disease, stage 3 unspecified: Secondary | ICD-10-CM

## 2021-03-15 DIAGNOSIS — I739 Peripheral vascular disease, unspecified: Secondary | ICD-10-CM

## 2021-03-15 DIAGNOSIS — I714 Abdominal aortic aneurysm, without rupture, unspecified: Secondary | ICD-10-CM

## 2021-03-15 DIAGNOSIS — E782 Mixed hyperlipidemia: Secondary | ICD-10-CM | POA: Diagnosis not present

## 2021-03-15 NOTE — Progress Notes (Signed)
MRN : [id number]  Nathan Rivera is a 75 y.o. (04/11/1946) male who presents with chief complaint of  Chief Complaint  Patient presents with   Follow-up    Ultrasound follow up  .  History of Present Illness:   The patient returns to the office for surveillance of an abdominal aortic aneurysm. Patient denies abdominal pain or back pain, no other abdominal complaints.  He is s/p endovascular repair of a ruptured AAA in 2011.   He notes he has had several falls which has led to some bruising.   Patient denies amaurosis fugax or TIA symptoms. There is no history of claudication or rest pain symptoms of the lower extremities. The patient denies angina or shortness of breath.   Duplex US of the aorta and iliac arteries shows the  AAA sac measures  4.08 cm which is decreased from 3.9,  No endoleak noted    Current Meds  Medication Sig   amLODipine (NORVASC) 5 MG tablet TAKE 1 TABLET BY MOUTH  DAILY   cholecalciferol (VITAMIN D) 1000 UNITS tablet Take 1,000 Units by mouth daily.   FOLIC ACID PO Take by mouth.   gabapentin (NEURONTIN) 100 MG capsule    Lactobacillus (CVS PROBIOTIC ACIDOPHILUS) 10 MG CAPS Take by mouth.   pantoprazole (PROTONIX) 40 MG tablet Take 40 mg by mouth at bedtime.    simvastatin (ZOCOR) 20 MG tablet Take 20 mg by mouth at bedtime.   XARELTO 20 MG TABS tablet daily.     Past Medical History:  Diagnosis Date   Aortic aneurysm (HCC)    Arthritis    knees are the biggest problem   Cancer [facility]) 2012   bladder   CKD (chronic kidney disease), stage III ([place]) 2014   relative to infection, septic- renal calculi   Degenerative disc disease    Degenerative joint disease    GERD (gastroesophageal reflux disease)    Hypercholesterolemia    Hypertension    no meds currently   Impaired glucose tolerance    Muscle fatigue    muscle fatigue relative to sepsis experience    Peripheral vascular disease ([place])    Pneumonia    Raynaud's disease    Sepsis ([place])  Nov. 2014   Thromboembolism of left popilteal vein 2015   pt. unsure which leg, treating with Xarelto   UTI (urinary tract infection) 08/17/13   Lt nephrostomy tube and stent placement    Past Surgical History:  Procedure Laterality Date   ABDOMINAL AORTIC ANEURYSM REPAIR  2011   CATARACT EXTRACTION W/PHACO Left 02/15/2021   Procedure: CATARACT EXTRACTION PHACO AND INTRAOCULAR LENS PLACEMENT (IOC) LEFT 5.59 00:44.0;  Surgeon: Eulogio Bear, MD;  Location: [place];  Service: Ophthalmology;  Laterality: Left;   CATARACT EXTRACTION W/PHACO Right 03/08/2021   Procedure: CATARACT EXTRACTION PHACO AND INTRAOCULAR LENS PLACEMENT (IOC) RIGHT 1.81 00:22.7;  Surgeon: Eulogio Bear, MD;  Location: [place];  Service: Ophthalmology;  Laterality: Right;   HERNIA REPAIR     68mo old   INSERTION OF MESH N/A 10/24/2014   Procedure: INSERTION OF MESH;  Surgeon: Autumn Messing III, MD;  Location: [place];  Service: General;  Laterality: N/A;   LAPAROTOMY N/A 08/24/2013   Procedure: EXPLORATORY LAPAROTOMY;  Surgeon: Merrie Roof, MD;  Location: [place];  Service: General;  Laterality: N/A;  Gram patch closure   litrotripsy  09/24/2013   NEPHROSTOMY  Nov. 2014   nephrostomy removal  Dec. 2014  PERIPHERALLY INSERTED CENTRAL CATHETER INSERTION     TONSILLECTOMY     UMBILICAL HERNIA REPAIR     72 yr ago   VENTRAL HERNIA REPAIR N/A 10/24/2014   Procedure: LAPAROSCOPIC ASSISTED VENTRAL HERNIA REPAIR WITH MESH;  Surgeon: Autumn Messing III, MD;  Location: [place];  Service: General;  Laterality: N/A;    Social History Social History   Tobacco Use   Smoking status: Every Day    Packs/day: 1.00    Years: 40.00    Pack years: 40.00    Types: Cigarettes   Smokeless tobacco: Never   Tobacco comments:    since age 74  Vaping Use   Vaping Use: Never used  Substance Use Topics   Alcohol use: Yes    Alcohol/week: 3.0 standard drinks    Types: 3 Glasses of wine per week   Drug use: No     Family History Family History  Problem Relation Age of Onset   CVA Father    Atrial fibrillation Sister     No Known Allergies   REVIEW OF SYSTEMS (Negative unless checked)  Constitutional: [] Weight loss  [] Fever  [] Chills Cardiac: [] Chest pain   [] Chest pressure   [] Palpitations   [] Shortness of breath when laying flat   [] Shortness of breath with exertion. Vascular:  [x] Pain in legs with walking   [] Pain in legs at rest  [] History of DVT   [] Phlebitis   [] Swelling in legs   [] Varicose veins   [] Non-healing ulcers Pulmonary:   [] Uses home oxygen   [] Productive cough   [] Hemoptysis   [] Wheeze  [] COPD   [] Asthma Neurologic:  [] Dizziness   [] Seizures   [] History of stroke   [] History of TIA  [] Aphasia   [] Vissual changes   [] Weakness or numbness in arm   [] Weakness or numbness in leg Musculoskeletal:   [] Joint swelling   [x] Joint pain   [x] Low back pain Hematologic:  [] Easy bruising  [] Easy bleeding   [] Hypercoagulable state   [] Anemic Gastrointestinal:  [] Diarrhea   [] Vomiting  [] Gastroesophageal reflux/heartburn   [] Difficulty swallowing. Genitourinary:  [x] Chronic kidney disease   [] Difficult urination  [] Frequent urination   [] Blood in urine Skin:  [] Rashes   [] Ulcers  Psychological:  [] History of anxiety   []  History of major depression.  Physical Examination  Vitals:   03/15/21 0839  BP: 130/74  Pulse: 67  Resp: 16  Weight: 222 lb (100.7 kg)   Body mass index is 31.85 kg/m. Gen: WD/WN, NAD Head: [place]/AT, No temporalis wasting.  Ear/Nose/Throat: Hearing grossly intact, nares w/o erythema or drainage Eyes: PER, EOMI, sclera nonicteric.  Neck: Supple, no large masses.   Pulmonary:  Good air movement, no audible wheezing bilaterally, no use of accessory muscles.  Cardiac: RRR, no JVD Vascular:  Vessel Right Left  Radial Palpable Palpable  PT Palpable Palpable  DP Palpable Palpable  Gastrointestinal: Non-distended. No guarding/no peritoneal signs.  Musculoskeletal:  M/S 5/5 throughout.  No deformity or atrophy.  Neurologic: CN 2-12 intact. Symmetrical.  Speech is fluent. Motor exam as listed above. Psychiatric: Judgment intact, Mood & affect appropriate for pt's clinical situation. Dermatologic: No rashes or ulcers noted.  No changes consistent with cellulitis. Lymph : No lichenification or skin changes of chronic lymphedema.  CBC Lab Results  Component Value Date   WBC 12.1 (H) 10/25/2014   HGB 14.9 10/25/2014   HCT 45.9 10/25/2014   MCV 93.5 10/25/2014   PLT 143 (L) 10/25/2014    BMET    Component Value Date/Time  NA 139 10/25/2014 0352   NA 144 10/23/2014 1630   K 4.9 10/25/2014 0352   K 4.1 10/23/2014 1630   CL 110 10/25/2014 0352   CL 112 (H) 10/23/2014 1630   CO2 27 10/25/2014 0352   CO2 26 10/23/2014 1630   GLUCOSE 122 (H) 10/25/2014 0352   GLUCOSE 83 10/23/2014 1630   BUN 25 (H) 10/25/2014 0352   BUN 30 (H) 10/23/2014 1630   CREATININE 1.93 (H) 10/25/2014 0352   CREATININE 1.96 (H) 10/23/2014 1630   CALCIUM 8.6 10/25/2014 0352   CALCIUM 9.0 10/23/2014 1630   GFRNONAA 34 (L) 10/25/2014 0352   GFRNONAA 36 (L) 10/23/2014 1630   GFRNONAA 8 (L) 08/18/2013 0504   GFRAA 39 (L) 10/25/2014 0352   GFRAA 44 (L) 10/23/2014 1630   GFRAA 9 (L) 08/18/2013 0504   CrCl cannot be calculated (Patient's most recent lab result is older than the maximum 21 days allowed.).  COAG Lab Results  Component Value Date   INR 1.5 10/20/2014   INR 1.16 08/18/2013    Radiology No results found.   Assessment/Plan 1. AAA (abdominal aortic aneurysm) without rupture (HCC) Recommend: Patient is status post successful endovascular repair of the AAA.   No further intervention is required at this time.   No endoleak is detected and the aneurysm sac is stable.   The patient will continue antiplatelet therapy as prescribed as well as aggressive management of hyperlipidemia. Exercise is again strongly encouraged.   However, endografts require  continued surveillance with ultrasound or CT scan. This is mandatory to detect any changes that allow repressurization of the aneurysm sac.  The patient is informed that this would be asymptomatic.   The patient is reminded that lifelong routine surveillance is a necessity with an endograft. Patient will continue to follow-up at 6 month intervals with ultrasound of the aorta. - VAS US AORTA/IVC/ILIACS; Future  2. PAD (peripheral artery disease) (HCC) Recommend:   The patient has evidence of atherosclerosis of the lower extremities with claudication.  The patient does not voice lifestyle limiting changes at this point in time.   Noninvasive studies do not suggest clinically significant change.   No invasive studies, angiography or surgery at this time The patient should continue walking and begin a more formal exercise program. The patient should continue antiplatelet therapy and aggressive treatment of the lipid abnormalities   No changes in the patient's medications at this time   The patient should continue wearing graduated compression socks 10-15 mmHg strength to control the mild edema.   3. Primary hypertension Continue antihypertensive medications as already ordered, these medications have been reviewed and there are no changes at this time.   4. Mixed hyperlipidemia Continue statin as ordered and reviewed, no changes at this time   5. Stage 3 chronic kidney disease, unspecified whether stage 3a or 3b CKD ([place]) At the present time the patient is not on dialysis.  Avoid nephrotoxic medications and dehydration.  Further plans per nephrology      Hortencia Pilar, MD  03/15/2021 8:49 AM

## 2021-03-16 ENCOUNTER — Encounter (INDEPENDENT_AMBULATORY_CARE_PROVIDER_SITE_OTHER): Payer: Self-pay | Admitting: Vascular Surgery

## 2021-05-03 ENCOUNTER — Other Ambulatory Visit (INDEPENDENT_AMBULATORY_CARE_PROVIDER_SITE_OTHER): Payer: Self-pay | Admitting: Nurse Practitioner

## 2021-05-17 ENCOUNTER — Telehealth (INDEPENDENT_AMBULATORY_CARE_PROVIDER_SITE_OTHER): Payer: Self-pay

## 2021-05-17 NOTE — Telephone Encounter (Signed)
Pts wife Clarene Critchley called and wanted to know why the pt's amlodipine had been denied. I called the pt's wife and made her aware that we do not follow pt's for  BP so we don't refill that medication PCP will have to refill.

## 2021-05-18 ENCOUNTER — Telehealth (INDEPENDENT_AMBULATORY_CARE_PROVIDER_SITE_OTHER): Payer: Self-pay

## 2021-05-18 NOTE — Telephone Encounter (Signed)
Heather from Dr Inis Sizer office called about the patient medication Amlodipine checking to see if our office will continue refills or should they refill the medication. I spoke with Dr Lucky Cowboy and he stated that its fine for them to take over the medication if they feel comfortable or we can continue. Dr Inis Sizer will continue refills for Amlodipine for the patient.

## 2022-03-13 NOTE — Progress Notes (Deleted)
MRN : [id number]  Nathan Rivera is a 76 y.o. (03-13-1946) male who presents with chief complaint of check circulation.  History of Present Illness:  The patient returns to the office for surveillance of an abdominal aortic aneurysm. Patient denies abdominal pain or back pain, no other abdominal complaints.  He is s/p endovascular repair of a ruptured AAA in 2011.   He notes he has had several falls which has led to some bruising.   Patient denies amaurosis fugax or TIA symptoms. There is no history of claudication or rest pain symptoms of the lower extremities. The patient denies angina or shortness of breath.   Duplex US of the aorta and iliac arteries shows the  AAA sac measures  4.08 cm which is decreased from 3.9,  No endoleak noted    No outpatient medications have been marked as taking for the 03/14/22 encounter (Appointment) with Delana Meyer, Dolores Lory, MD.    Past Medical History:  Diagnosis Date   Aortic aneurysm ([place])    Arthritis    knees are the biggest problem   Cancer [facility]) 2012   bladder   CKD (chronic kidney disease), stage III ([place]) 2014   relative to infection, septic- renal calculi   Degenerative disc disease    Degenerative joint disease    GERD (gastroesophageal reflux disease)    Hypercholesterolemia    Hypertension    no meds currently   Impaired glucose tolerance    Muscle fatigue    muscle fatigue relative to sepsis experience    Peripheral vascular disease ([place])    Pneumonia    Raynaud's disease    Sepsis ([place]) Nov. 2014   Thromboembolism of left popilteal vein 2015   pt. unsure which leg, treating with Xarelto   UTI (urinary tract infection) 08/17/13   Lt nephrostomy tube and stent placement    Past Surgical History:  Procedure Laterality Date   ABDOMINAL AORTIC ANEURYSM REPAIR  2011   CATARACT EXTRACTION W/PHACO Left 02/15/2021   Procedure: CATARACT EXTRACTION PHACO AND INTRAOCULAR LENS PLACEMENT (IOC) LEFT 5.59 00:44.0;  Surgeon: Eulogio Bear, MD;  Location: [place];  Service: Ophthalmology;  Laterality: Left;   CATARACT EXTRACTION W/PHACO Right 03/08/2021   Procedure: CATARACT EXTRACTION PHACO AND INTRAOCULAR LENS PLACEMENT (IOC) RIGHT 1.81 00:22.7;  Surgeon: Eulogio Bear, MD;  Location: [place];  Service: Ophthalmology;  Laterality: Right;   HERNIA REPAIR     64moold   INSERTION OF MESH N/A 10/24/2014   Procedure: INSERTION OF MESH;  Surgeon: PAutumn MessingIII, MD;  [place]  Service: General;  Laterality: N/A;   LAPAROTOMY N/A 08/24/2013   Procedure: EXPLORATORY LAPAROTOMY;  Surgeon: PMerrie Roof MD;  [place]  Service: General;  Laterality: N/A;  Gram patch closure   litrotripsy  09/24/2013   NEPHROSTOMY  Nov. 2014   nephrostomy removal  Dec. 2014   PERIPHERALLY INSERTED CENTRAL CATHETER INSERTION     TONSILLECTOMY     UMBILICAL HERNIA REPAIR     584yr ago   [place]/A 10/24/2014   Procedure: LAPAROSCOPIC ASSISTED VENTRAL HERNIA REPAIR WITH MESH;  Surgeon: PAutumn MessingIII, MD;  [place]  Service: General;  Laterality: N/A;    Social History Social History   Tobacco Use   Smoking status: Every Day    Packs/day: 1.00    Years: 40.00    Total pack years: 40.00    Types:  Cigarettes   Smokeless tobacco: Never   Tobacco comments:    since age 27  Vaping Use   Vaping Use: Never used  Substance Use Topics   Alcohol use: Yes    Alcohol/week: 3.0 standard drinks of alcohol    Types: 3 Glasses of wine per week   Drug use: No    Family History Family History  Problem Relation Age of Onset   CVA Father    Atrial fibrillation Sister     No Known Allergies   REVIEW OF SYSTEMS (Negative unless checked)  Constitutional: '[]'$ Weight loss  '[]'$ Fever  '[]'$ Chills Cardiac: '[]'$ Chest pain   '[]'$ Chest pressure   '[]'$ Palpitations   '[]'$ Shortness of breath when laying flat   '[]'$ Shortness of breath with exertion. Vascular:  '[x]'$ Pain in legs with walking   '[]'$ Pain  in legs at rest  '[]'$ History of DVT   '[]'$ Phlebitis   '[]'$ Swelling in legs   '[]'$ Varicose veins   '[]'$ Non-healing ulcers Pulmonary:   '[]'$ Uses home oxygen   '[]'$ Productive cough   '[]'$ Hemoptysis   '[]'$ Wheeze  '[]'$ COPD   '[]'$ Asthma Neurologic:  '[]'$ Dizziness   '[]'$ Seizures   '[]'$ History of stroke   '[]'$ History of TIA  '[]'$ Aphasia   '[]'$ Vissual changes   '[]'$ Weakness or numbness in arm   '[]'$ Weakness or numbness in leg Musculoskeletal:   '[]'$ Joint swelling   '[]'$ Joint pain   '[]'$ Low back pain Hematologic:  '[]'$ Easy bruising  '[]'$ Easy bleeding   '[]'$ Hypercoagulable state   '[]'$ Anemic Gastrointestinal:  '[]'$ Diarrhea   '[]'$ Vomiting  '[]'$ Gastroesophageal reflux/heartburn   '[]'$ Difficulty swallowing. Genitourinary:  '[]'$ Chronic kidney disease   '[]'$ Difficult urination  '[]'$ Frequent urination   '[]'$ Blood in urine Skin:  '[]'$ Rashes   '[]'$ Ulcers  Psychological:  '[]'$ History of anxiety   '[]'$  History of major depression.  Physical Examination  There were no vitals filed for this visit. There is no height or weight on file to calculate BMI. Gen: WD/WN, NAD Head: [place]/AT, No temporalis wasting.  Ear/Nose/Throat: Hearing grossly intact, nares w/o erythema or drainage Eyes: PER, EOMI, sclera nonicteric.  Neck: Supple, no masses.  No bruit or JVD.  Pulmonary:  Good air movement, no audible wheezing, no use of accessory muscles.  Cardiac: RRR, normal S1, S2, no Murmurs. Vascular:  mild trophic changes, no open wounds Vessel Right Left  Radial Palpable Palpable  PT Not Palpable Not Palpable  DP Not Palpable Not Palpable  Gastrointestinal: soft, non-distended. No guarding/no peritoneal signs.  Musculoskeletal: M/S 5/5 throughout.  No visible deformity.  Neurologic: CN 2-12 intact. Pain and light touch intact in extremities.  Symmetrical.  Speech is fluent. Motor exam as listed above. Psychiatric: Judgment intact, Mood & affect appropriate for pt's clinical situation. Dermatologic: No rashes or ulcers noted.  No changes consistent with cellulitis.   CBC Lab Results  Component  Value Date   WBC 12.1 (H) 10/25/2014   HGB 14.9 10/25/2014   HCT 45.9 10/25/2014   MCV 93.5 10/25/2014   PLT 143 (L) 10/25/2014    BMET    Component Value Date/Time   NA 139 10/25/2014 0352   NA 144 10/23/2014 1630   K 4.9 10/25/2014 0352   K 4.1 10/23/2014 1630   CL 110 10/25/2014 0352   CL 112 (H) 10/23/2014 1630   CO2 27 10/25/2014 0352   CO2 26 10/23/2014 1630   GLUCOSE 122 (H) 10/25/2014 0352   GLUCOSE 83 10/23/2014 1630   BUN 25 (H) 10/25/2014 0352   BUN 30 (H) 10/23/2014 1630   CREATININE 1.93 (H) 10/25/2014 0352   CREATININE 1.96 (  H) 10/23/2014 1630   CALCIUM 8.6 10/25/2014 0352   CALCIUM 9.0 10/23/2014 1630   GFRNONAA 34 (L) 10/25/2014 0352   GFRNONAA 36 (L) 10/23/2014 1630   GFRNONAA 8 (L) 08/18/2013 0504   GFRAA 39 (L) 10/25/2014 0352   GFRAA 44 (L) 10/23/2014 1630   GFRAA 9 (L) 08/18/2013 0504   CrCl cannot be calculated (Patient's most recent lab result is older than the maximum 21 days allowed.).  COAG Lab Results  Component Value Date   INR 1.5 10/20/2014   INR 1.16 08/18/2013    Radiology No results found.   Assessment/Plan There are no diagnoses linked to this encounter.   Hortencia Pilar, MD  03/13/2022 4:09 PM

## 2022-03-14 ENCOUNTER — Ambulatory Visit (INDEPENDENT_AMBULATORY_CARE_PROVIDER_SITE_OTHER): Payer: Medicare Other | Admitting: Vascular Surgery

## 2022-03-14 ENCOUNTER — Other Ambulatory Visit (INDEPENDENT_AMBULATORY_CARE_PROVIDER_SITE_OTHER): Payer: Medicare Other

## 2022-03-14 DIAGNOSIS — I739 Peripheral vascular disease, unspecified: Secondary | ICD-10-CM

## 2022-03-14 DIAGNOSIS — N183 Chronic kidney disease, stage 3 unspecified: Secondary | ICD-10-CM

## 2022-03-14 DIAGNOSIS — I10 Essential (primary) hypertension: Secondary | ICD-10-CM

## 2022-03-14 DIAGNOSIS — I7143 Infrarenal abdominal aortic aneurysm, without rupture: Secondary | ICD-10-CM

## 2022-03-14 DIAGNOSIS — E782 Mixed hyperlipidemia: Secondary | ICD-10-CM

## 2022-05-02 ENCOUNTER — Ambulatory Visit (INDEPENDENT_AMBULATORY_CARE_PROVIDER_SITE_OTHER): Payer: Medicare Other | Admitting: Vascular Surgery

## 2022-05-02 ENCOUNTER — Ambulatory Visit (INDEPENDENT_AMBULATORY_CARE_PROVIDER_SITE_OTHER): Payer: Medicare Other

## 2022-05-02 ENCOUNTER — Encounter (INDEPENDENT_AMBULATORY_CARE_PROVIDER_SITE_OTHER): Payer: Self-pay | Admitting: Vascular Surgery

## 2022-05-02 ENCOUNTER — Other Ambulatory Visit (INDEPENDENT_AMBULATORY_CARE_PROVIDER_SITE_OTHER): Payer: Self-pay | Admitting: Vascular Surgery

## 2022-05-02 VITALS — BP 138/87 | HR 61 | Resp 17 | Ht 70.0 in | Wt 203.8 lb

## 2022-05-02 DIAGNOSIS — I7143 Infrarenal abdominal aortic aneurysm, without rupture: Secondary | ICD-10-CM | POA: Diagnosis not present

## 2022-05-02 DIAGNOSIS — E782 Mixed hyperlipidemia: Secondary | ICD-10-CM | POA: Diagnosis not present

## 2022-05-02 DIAGNOSIS — I10 Essential (primary) hypertension: Secondary | ICD-10-CM | POA: Diagnosis not present

## 2022-05-02 DIAGNOSIS — Z9889 Other specified postprocedural states: Secondary | ICD-10-CM

## 2022-05-02 DIAGNOSIS — I739 Peripheral vascular disease, unspecified: Secondary | ICD-10-CM

## 2022-05-02 NOTE — Progress Notes (Addendum)
MRN : [id number]  Nathan Rivera is a 76 y.o. ([birth year]) male who presents with chief complaint of check circulation.  History of Present Illness:   The patient returns to the office for surveillance of an abdominal aortic aneurysm. Patient denies abdominal pain or back pain, no other abdominal complaints.  He is s/p endovascular repair of a ruptured AAA in 2011.   He notes he has had several falls which has led to some bruising.   Patient denies amaurosis fugax or TIA symptoms. There is no history of claudication or rest pain symptoms of the lower extremities. The patient denies angina or shortness of breath.   Duplex US of the aorta and iliac arteries shows the  AAA sac measures  4.08 cm which is decreased from 3.9,  No endoleak noted    No outpatient medications have been marked as taking for the 05/02/22 encounter (Appointment) with Delana Meyer, Dolores Lory, MD.    Past Medical History:  Diagnosis Date   Aortic aneurysm ([place])    Arthritis    knees are the biggest problem   Cancer [facility]) 2012   bladder   CKD (chronic kidney disease), stage III ([place]) 2014   relative to infection, septic- renal calculi   Degenerative disc disease    Degenerative joint disease    GERD (gastroesophageal reflux disease)    Hypercholesterolemia    Hypertension    no meds currently   Impaired glucose tolerance    Muscle fatigue    muscle fatigue relative to sepsis experience    Peripheral vascular disease ([place])    Pneumonia    Raynaud's disease    Sepsis ([place]) Nov. 2014   Thromboembolism of left popilteal vein 2015   pt. unsure which leg, treating with Xarelto   UTI (urinary tract infection) 08/17/13   Lt nephrostomy tube and stent placement    Past Surgical History:  Procedure Laterality Date   ABDOMINAL AORTIC ANEURYSM REPAIR  2011   CATARACT EXTRACTION W/PHACO Left 02/15/2021   Procedure: CATARACT EXTRACTION PHACO AND INTRAOCULAR LENS PLACEMENT (IOC) LEFT 5.59 00:44.0;   Surgeon: Eulogio Bear, MD;  Location: [place];  Service: Ophthalmology;  Laterality: Left;   CATARACT EXTRACTION W/PHACO Right 03/08/2021   Procedure: CATARACT EXTRACTION PHACO AND INTRAOCULAR LENS PLACEMENT (IOC) RIGHT 1.81 00:22.7;  Surgeon: Eulogio Bear, MD;  Location: [place];  Service: Ophthalmology;  Laterality: Right;   HERNIA REPAIR     56moold   INSERTION OF MESH N/A 10/24/2014   Procedure: INSERTION OF MESH;  Surgeon: PAutumn MessingIII, MD;  [place]  Service: General;  Laterality: N/A;   LAPAROTOMY N/A 08/24/2013   Procedure: EXPLORATORY LAPAROTOMY;  Surgeon: PMerrie Roof MD;  [place]  Service: General;  Laterality: N/A;  Gram patch closure   litrotripsy  09/24/2013   NEPHROSTOMY  Nov. 2014   nephrostomy removal  Dec. 2014   PERIPHERALLY INSERTED CENTRAL CATHETER INSERTION     TONSILLECTOMY     UMBILICAL HERNIA REPAIR     551yr ago   VEtna GreenN/A 10/24/2014   Procedure: LAPAROSCOPIC ASSISTED VENTRAL HERNIA REPAIR WITH MESH;  Surgeon: PAutumn MessingIII, MD;  [place]  Service: General;  Laterality: N/A;    Social History Social History   Tobacco Use   Smoking status: Every Day    Packs/day: 1.00    Years:  40.00    Total pack years: 40.00    Types: Cigarettes   Smokeless tobacco: Never   Tobacco comments:    since age 66  Vaping Use   Vaping Use: Never used  Substance Use Topics   Alcohol use: Yes    Alcohol/week: 3.0 standard drinks of alcohol    Types: 3 Glasses of wine per week   Drug use: No    Family History Family History  Problem Relation Age of Onset   CVA Father    Atrial fibrillation Sister     No Known Allergies   REVIEW OF SYSTEMS (Negative unless checked)  Constitutional: '[]'$ Weight loss  '[]'$ Fever  '[]'$ Chills Cardiac: '[]'$ Chest pain   '[]'$ Chest pressure   '[]'$ Palpitations   '[]'$ Shortness of breath when laying flat   '[]'$ Shortness of breath with exertion. Vascular:  '[x]'$ Pain in legs with  walking   '[]'$ Pain in legs at rest  '[]'$ History of DVT   '[]'$ Phlebitis   '[]'$ Swelling in legs   '[]'$ Varicose veins   '[]'$ Non-healing ulcers Pulmonary:   '[]'$ Uses home oxygen   '[]'$ Productive cough   '[]'$ Hemoptysis   '[]'$ Wheeze  '[]'$ COPD   '[]'$ Asthma Neurologic:  '[]'$ Dizziness   '[]'$ Seizures   '[]'$ History of stroke   '[]'$ History of TIA  '[]'$ Aphasia   '[]'$ Vissual changes   '[]'$ Weakness or numbness in arm   '[]'$ Weakness or numbness in leg Musculoskeletal:   '[]'$ Joint swelling   '[]'$ Joint pain   '[]'$ Low back pain Hematologic:  '[]'$ Easy bruising  '[]'$ Easy bleeding   '[]'$ Hypercoagulable state   '[]'$ Anemic Gastrointestinal:  '[]'$ Diarrhea   '[]'$ Vomiting  '[]'$ Gastroesophageal reflux/heartburn   '[]'$ Difficulty swallowing. Genitourinary:  '[]'$ Chronic kidney disease   '[]'$ Difficult urination  '[]'$ Frequent urination   '[]'$ Blood in urine Skin:  '[]'$ Rashes   '[]'$ Ulcers  Psychological:  '[]'$ History of anxiety   '[]'$  History of major depression.  Physical Examination  There were no vitals filed for this visit. There is no height or weight on file to calculate BMI. Gen: WD/WN, NAD Head: [place]/AT, No temporalis wasting.  Ear/Nose/Throat: Hearing grossly intact, nares w/o erythema or drainage Eyes: PER, EOMI, sclera nonicteric.  Neck: Supple, no masses.  No bruit or JVD.  Pulmonary:  Good air movement, no audible wheezing, no use of accessory muscles.  Cardiac: RRR, normal S1, S2, no Murmurs. Vascular:  mild trophic changes, no open wounds Vessel Right Left  Radial Palpable Palpable  Gastrointestinal: soft, non-distended. No guarding/no peritoneal signs.  Musculoskeletal: M/S 5/5 throughout.  No visible deformity.  Neurologic: CN 2-12 intact. Pain and light touch intact in extremities.  Symmetrical.  Speech is fluent. Motor exam as listed above. Psychiatric: Judgment intact, Mood & affect appropriate for pt's clinical situation. Dermatologic: No rashes or ulcers noted.  No changes consistent with cellulitis.   CBC Lab Results  Component Value Date   WBC 12.1 (H) 10/25/2014   HGB  14.9 10/25/2014   HCT 45.9 10/25/2014   MCV 93.5 10/25/2014   PLT 143 (L) 10/25/2014    BMET    Component Value Date/Time   NA 139 10/25/2014 0352   NA 144 10/23/2014 1630   K 4.9 10/25/2014 0352   K 4.1 10/23/2014 1630   CL 110 10/25/2014 0352   CL 112 (H) 10/23/2014 1630   CO2 27 10/25/2014 0352   CO2 26 10/23/2014 1630   GLUCOSE 122 (H) 10/25/2014 0352   GLUCOSE 83 10/23/2014 1630   BUN 25 (H) 10/25/2014 0352   BUN 30 (H) 10/23/2014 1630   CREATININE 1.93 (H) 10/25/2014 0352   CREATININE 1.96 (  H) 10/23/2014 1630   CALCIUM 8.6 10/25/2014 0352   CALCIUM 9.0 10/23/2014 1630   GFRNONAA 34 (L) 10/25/2014 0352   GFRNONAA 36 (L) 10/23/2014 1630   GFRNONAA 8 (L) 08/18/2013 0504   GFRAA 39 (L) 10/25/2014 0352   GFRAA 44 (L) 10/23/2014 1630   GFRAA 9 (L) 08/18/2013 0504   CrCl cannot be calculated (Patient's most recent lab result is older than the maximum 21 days allowed.).  COAG Lab Results  Component Value Date   INR 1.5 10/20/2014   INR 1.16 08/18/2013    Radiology No results found.   Assessment/Plan 1. Infrarenal abdominal aortic aneurysm (AAA) without rupture (HCC) Recommend:  Patient is status post successful endovascular repair of the AAA.   No further intervention is required at this time.   No endoleak is detected and the aneurysm sac is stable.   The patient will continue antiplatelet therapy as prescribed as well as aggressive management of hyperlipidemia. Exercise is again strongly encouraged.   However, endografts require continued surveillance with ultrasound or CT scan. This is mandatory to detect any changes that allow repressurization of the aneurysm sac.  The patient is informed that this would be asymptomatic.   The patient is reminded that lifelong routine surveillance is a necessity with an endograft. Patient will continue to follow-up at 24 month intervals with ultrasound of the aorta  2. PAD (peripheral artery disease) (HCC) Recommend:    The patient has evidence of atherosclerosis of the lower extremities with claudication.  The patient does not voice lifestyle limiting changes at this point in time.   Noninvasive studies do not suggest clinically significant change.   No invasive studies, angiography or surgery at this time The patient should continue walking and begin a more formal exercise program. The patient should continue antiplatelet therapy and aggressive treatment of the lipid abnormalities   No changes in the patient's medications at this time   The patient should continue wearing graduated compression socks 10-15 mmHg strength to control the mild edema.   3. Primary hypertension Continue antihypertensive medications as already ordered, these medications have been reviewed and there are no changes at this time.   4. Mixed hyperlipidemia Continue statin as ordered and reviewed, no changes at this time     Hortencia Pilar, MD  05/02/2022 8:34 AM

## 2022-05-31 ENCOUNTER — Other Ambulatory Visit (HOSPITAL_COMMUNITY): Payer: Self-pay | Admitting: Physician Assistant

## 2022-05-31 ENCOUNTER — Other Ambulatory Visit: Payer: Self-pay | Admitting: Physician Assistant

## 2022-05-31 DIAGNOSIS — R413 Other amnesia: Secondary | ICD-10-CM

## 2022-05-31 DIAGNOSIS — R2689 Other abnormalities of gait and mobility: Secondary | ICD-10-CM

## 2022-05-31 DIAGNOSIS — R531 Weakness: Secondary | ICD-10-CM

## 2022-05-31 DIAGNOSIS — R251 Tremor, unspecified: Secondary | ICD-10-CM

## 2022-06-10 ENCOUNTER — Ambulatory Visit
Admission: RE | Admit: 2022-06-10 | Discharge: 2022-06-10 | Disposition: A | Discharge status: Home / Self Care | Payer: Medicare Other | Source: Ambulatory Visit | Attending: Physician Assistant | Admitting: Physician Assistant

## 2022-06-10 DIAGNOSIS — R413 Other amnesia: Secondary | ICD-10-CM | POA: Diagnosis not present

## 2022-06-10 DIAGNOSIS — R251 Tremor, unspecified: Secondary | ICD-10-CM | POA: Diagnosis present

## 2022-06-10 DIAGNOSIS — R2689 Other abnormalities of gait and mobility: Secondary | ICD-10-CM | POA: Diagnosis present

## 2022-06-10 DIAGNOSIS — R531 Weakness: Secondary | ICD-10-CM

## 2022-10-20 ENCOUNTER — Emergency Department: Payer: Medicare Other

## 2022-10-20 ENCOUNTER — Other Ambulatory Visit: Payer: Self-pay

## 2022-10-20 ENCOUNTER — Emergency Department
Admission: EM | Admit: 2022-10-20 | Discharge: 2022-10-20 | Disposition: A | Discharge status: Home / Self Care | Payer: Medicare Other | Attending: Student in an Organized Health Care Education/Training Program | Admitting: Student in an Organized Health Care Education/Training Program

## 2022-10-20 DIAGNOSIS — K4020 Bilateral inguinal hernia, without obstruction or gangrene, not specified as recurrent: Secondary | ICD-10-CM | POA: Diagnosis not present

## 2022-10-20 DIAGNOSIS — R1030 Lower abdominal pain, unspecified: Secondary | ICD-10-CM | POA: Diagnosis present

## 2022-10-20 DIAGNOSIS — D72829 Elevated white blood cell count, unspecified: Secondary | ICD-10-CM | POA: Diagnosis not present

## 2022-10-20 LAB — COMPREHENSIVE METABOLIC PANEL
ALT: 16 U/L (ref 0–44)
AST: 19 U/L (ref 15–41)
Albumin: 4.1 g/dL (ref 3.5–5.0)
Alkaline Phosphatase: 52 U/L (ref 38–126)
Anion gap: 6 (ref 5–15)
BUN: 28 mg/dL — ABNORMAL HIGH (ref 8–23)
CO2: 27 mmol/L (ref 22–32)
Calcium: 9.1 mg/dL (ref 8.9–10.3)
Chloride: 107 mmol/L (ref 98–111)
Creatinine, Ser: 2.5 mg/dL — ABNORMAL HIGH (ref 0.61–1.24)
GFR, Estimated: 26 mL/min — ABNORMAL LOW (ref >60)
Glucose, Bld: 95 mg/dL (ref 70–99)
Potassium: 3.9 mmol/L (ref 3.5–5.1)
Sodium: 140 mmol/L (ref 135–145)
Total Bilirubin: 0.8 mg/dL (ref 0.3–1.2)
Total Protein: 7.1 g/dL (ref 6.5–8.1)

## 2022-10-20 LAB — URINALYSIS, ROUTINE W REFLEX MICROSCOPIC
Bilirubin Urine: NEGATIVE
Glucose, UA: NEGATIVE mg/dL
Hgb urine dipstick: NEGATIVE
Ketones, ur: NEGATIVE mg/dL
Nitrite: NEGATIVE
Protein, ur: >=300 mg/dL — AB
Specific Gravity, Urine: 1.018 (ref 1.005–1.030)
pH: 6 (ref 5.0–8.0)

## 2022-10-20 LAB — CBC
HCT: 48.7 % (ref 39.0–52.0)
Hemoglobin: 15.5 g/dL (ref 13.0–17.0)
MCH: 30.3 pg (ref 26.0–34.0)
MCHC: 31.8 g/dL (ref 30.0–36.0)
MCV: 95.3 fL (ref 80.0–100.0)
Platelets: 167 10*3/uL (ref 150–400)
RBC: 5.11 MIL/uL (ref 4.22–5.81)
RDW: 14.2 % (ref 11.5–15.5)
WBC: 11.6 10*3/uL — ABNORMAL HIGH (ref 4.0–10.5)
nRBC: 0 % (ref 0.0–0.2)

## 2022-10-20 LAB — LIPASE, BLOOD: Lipase: 53 U/L — ABNORMAL HIGH (ref 11–51)

## 2022-10-20 MED ORDER — CEPHALEXIN 250 MG PO CAPS
250.0000 mg | ORAL_CAPSULE | Freq: Two times a day (BID) | ORAL | Status: DC
  Administered 2022-10-20: 250 mg via ORAL
  Filled 2022-10-20: qty 1

## 2022-10-20 MED ORDER — CEPHALEXIN 250 MG PO CAPS: 250.0000 mg | ORAL_CAPSULE | Freq: Two times a day (BID) | ORAL | 0 refills | Status: AC

## 2022-10-20 NOTE — ED Provider Triage Note (Signed)
Emergency Medicine Provider Triage Evaluation Note  Nathan Rivera , a 77 y.o. male  was evaluated in triage.  Pt complains of testicle pain and swelling. Symptoms started earlier today.   Physical Exam  BP (!) 157/83   Pulse 96   Temp 98.7 F (37.1 C) (Oral)   Resp 20   Ht '5\' 11"'$  (1.803 m)   Wt 88.9 kg   SpO2 96%   BMI 27.34 kg/m  Gen:   Awake, no distress   Resp:  Normal effort  MSK:   Moves extremities without difficulty  Other:    Medical Decision Making  Medically screening exam initiated at 7:12 PM.  Appropriate orders placed.  Freddie Breech was informed that the remainder of the evaluation will be completed by another provider, this initial triage assessment does not replace that evaluation, and the importance of remaining in the ED until their evaluation is complete.  [place] ordered   Victorino Dike, [place] 10/20/22 1927

## 2022-10-20 NOTE — ED Provider Notes (Signed)
[facility] Provider Note    Event Date/Time   First MD Initiated Contact with Patient 10/20/22 2050     (approximate)   History   Abdominal Pain and Groin Swelling   HPI  Nathan Rivera is a 77 y.o. male presents to the ER for evaluation of lower abdominal pain as well as swelling in his groin that occurred earlier today around 3:00.  Felt his burning sensation.  No associated nausea or vomiting.  No diarrhea.  Does not recall ever having pain like this before.  No measured fevers or chills.  Since around the ER his symptoms have significantly improved and resolved.  He denies any pain or discomfort at this time.     Physical Exam   Triage Vital Signs: ED Triage Vitals  Enc Vitals Group     BP 10/20/22 1909 (!) 157/83     Pulse Rate 10/20/22 1909 96     Resp 10/20/22 1909 20     Temp 10/20/22 1909 98.7 F (37.1 C)     Temp Source 10/20/22 1909 Oral     SpO2 10/20/22 1909 96 %     Weight 10/20/22 1910 196 lb (88.9 kg)     Height 10/20/22 1910 '5\' 11"'$  (1.803 m)     Head Circumference --      Peak Flow --      Pain Score 10/20/22 1909 4     Pain Loc --      Pain Edu? --      Excl. in [place]? --     Most recent vital signs: Vitals:   10/20/22 1909 10/20/22 2206  BP: (!) 157/83 116/88  Pulse: 96 80  Resp: 20 16  Temp: 98.7 F (37.1 C) 98.2 F (36.8 C)  SpO2: 96% 97%     Constitutional: Alert  Eyes: Conjunctivae are normal.  Head: Atraumatic. Nose: No congestion/rhinnorhea. Mouth/Throat: Mucous membranes are moist.   Neck: Painless ROM.  Cardiovascular:   Good peripheral circulation. Respiratory: Normal respiratory effort.  No retractions.  Gastrointestinal: Soft and nontender.   Reducible large right inguinal hernia.  Slightly smaller left inguinal hernia also reducible.  No overlying erythema or warmth.  No significant pain with palpation. Musculoskeletal:  no deformity Neurologic:  MAE spontaneously. No gross focal neurologic deficits  are appreciated.  Skin:  Skin is warm, dry and intact. No rash noted. Psychiatric: Mood and affect are normal. Speech and behavior are normal.    ED Results / Procedures / Treatments   Labs (all labs ordered are listed, but only abnormal results are displayed) Labs Reviewed  LIPASE, BLOOD - Abnormal; Notable for the following components:      Result Value   Lipase 53 (*)    All other components within normal limits  COMPREHENSIVE METABOLIC PANEL - Abnormal; Notable for the following components:   BUN 28 (*)    Creatinine, Ser 2.50 (*)    GFR, Estimated 26 (*)    All other components within normal limits  CBC - Abnormal; Notable for the following components:   WBC 11.6 (*)    All other components within normal limits  URINALYSIS, ROUTINE W REFLEX MICROSCOPIC - Abnormal; Notable for the following components:   Color, Urine YELLOW (*)    APPearance HAZY (*)    Protein, ur >=300 (*)    Leukocytes,Ua MODERATE (*)    Bacteria, UA MANY (*)    All other components within normal limits     EKG  RADIOLOGY Please see ED Course for my review and interpretation.  I personally reviewed all radiographic images ordered to evaluate for the above acute complaints and reviewed radiology reports and findings.  These findings were personally discussed with the patient.  Please see medical record for radiology report.    PROCEDURES:  Critical Care performed:   Procedures   MEDICATIONS ORDERED IN ED: Medications  cephALEXin (KEFLEX) capsule 250 mg (250 mg Oral Given 10/20/22 2209)     IMPRESSION / MDM / ASSESSMENT AND PLAN / ED COURSE  I reviewed the triage vital signs and the nursing notes.                              Differential diagnosis includes, but is not limited to, hernia, strangulated hernia, incarcerated hernia, torsion, stone, pyelo-, colitis  Patient presenting to the ER for evaluation of symptoms as described above.  Based on symptoms, risk factors and  considered above differential, this presenting complaint could reflect a potentially life-threatening illness therefore the patient will be placed on continuous pulse oximetry and telemetry for monitoring.  Laboratory evaluation will be sent to evaluate for the above complaints.   Ultrasound ordered out of triage without evidence of testicular abnormality.  On exam patient has evidence of bilateral inguinal hernias.  These are location of the patient's pain that is now resolved.  Unable to reduce both hernias.  Given his age risk factors and extent of pain will order CT imaging to make sure he does not have kidney stone.   Clinical Course as of 10/20/22 2233  Thu Oct 20, 2022  2127 CT imaging on my review and interpretation without evidence of ureterolithiasis.  Does show bilateral inguinal hernia. [PR]  2152 CT imaging with bilateral inguinal hernia.  Discussed findings with patient.  His urine does have moderate leukocytes many bacteria will cover with antibiotics.  Does appear stable appropriate for outpatient follow-up. [PR]    Clinical Course User Index [PR] Merlyn Lot, MD    FINAL CLINICAL IMPRESSION(S) / ED DIAGNOSES   Final diagnoses:  Bilateral inguinal hernia without obstruction or gangrene, recurrence not specified     Rx / DC Orders   ED Discharge Orders          Ordered    cephALEXin (KEFLEX) 250 MG capsule  2 times daily        10/20/22 2154             Note:  This document was prepared using Dragon voice recognition software and may include unintentional dictation errors.    Merlyn Lot, MD 10/20/22 2233

## 2022-10-20 NOTE — ED Triage Notes (Signed)
Pt presents to ED via POV with c/o abd pain and groin/testicle swelling. Pt states pain started this afternoon around 3pm. Within an hr,  pain started radiating downward to groin and pt noticed some swelling to right/left testicle. Pt reports pain and swelling have gone down. Pt in NAD at this time. Denies N/V/D, CP, SOB

## 2022-10-31 ENCOUNTER — Ambulatory Visit: Payer: Medicare Other | Admitting: Surgery

## 2022-10-31 ENCOUNTER — Other Ambulatory Visit: Payer: Self-pay

## 2022-10-31 ENCOUNTER — Telehealth (INDEPENDENT_AMBULATORY_CARE_PROVIDER_SITE_OTHER): Payer: Self-pay | Admitting: Vascular Surgery

## 2022-10-31 ENCOUNTER — Encounter: Payer: Self-pay | Admitting: Surgery

## 2022-10-31 VITALS — BP 145/99 | HR 71 | Temp 98.2°F | Ht 71.0 in | Wt 198.0 lb

## 2022-10-31 DIAGNOSIS — K4020 Bilateral inguinal hernia, without obstruction or gangrene, not specified as recurrent: Secondary | ICD-10-CM

## 2022-10-31 NOTE — H&P (View-Only) (Signed)
10/31/2022  Reason for Visit:  Bilateral inguinal hernia  History of Present Illness: Nathan Rivera is a 77 y.o. male presenting for evaluation of bilateral inguinal hernias.  The patient presented to emergency room on 10/20/2022 when he had a sudden onset of lower abdominal and groin pain.  He reports that day he was not feeling well overall and then after showering he noticed the significant swelling in the groin areas and presented to the ED.  In the ED they were able to reduce his hernias.  He had a CT scan of abdomen/pelvis which showed moderate sized hernias in bilateral groins.  The right inguinal hernia contained loops of small bowel and the left inguinal hernia contained a loop of sigmoid colon.  No obstruction evidence.  Today he reports that he has not had any significant pain the groin areas since his ED visit.  Denies any current nausea or vomiting, abdominal pain, or constipation.  He does report that he had a day of diarrhea after he came to the ED.    Of note, he has a history of what sounds like a right inguinal hernia when he was an infant, prior exlap for gastric perforation and subsequent lap ventral hernia repair.  Past Medical History: Past Medical History:  Diagnosis Date   Aortic aneurysm ([place])    Arthritis    knees are the biggest problem   Cancer ([place]) 2012   bladder   CKD (chronic kidney disease), stage III ([place]) 2014   relative to infection, septic- renal calculi   Degenerative disc disease    Degenerative joint disease    GERD (gastroesophageal reflux disease)    Hypercholesterolemia    Hypertension    no meds currently   Impaired glucose tolerance    Muscle fatigue    muscle fatigue relative to sepsis experience    Peripheral vascular disease ([place])    Pneumonia    Raynaud's disease    Sepsis ([place]) Nov. 2014   Thromboembolism of left popilteal vein 2015   pt. unsure which leg, treating with Xarelto   UTI (urinary tract infection) 08/17/13   Lt  nephrostomy tube and stent placement     Past Surgical History: Past Surgical History:  Procedure Laterality Date   ABDOMINAL AORTIC ANEURYSM REPAIR  2011   CATARACT EXTRACTION W/PHACO Left 02/15/2021   Procedure: CATARACT EXTRACTION PHACO AND INTRAOCULAR LENS PLACEMENT (IOC) LEFT 5.59 00:44.0;  Surgeon: Eulogio Bear, MD;  Location: [facility];  Service: Ophthalmology;  Laterality: Left;   CATARACT EXTRACTION W/PHACO Right 03/08/2021   Procedure: CATARACT EXTRACTION PHACO AND INTRAOCULAR LENS PLACEMENT (IOC) RIGHT 1.81 00:22.7;  Surgeon: Eulogio Bear, MD;  Location: [facility];  Service: Ophthalmology;  Laterality: Right;   HERNIA REPAIR     86mo[age]   INSERTION OF MESH N/A 10/24/2014   Procedure: INSERTION OF MESH;  Surgeon: PAutumn MessingIII, MD;  [place] [facility]  Service: General;  Laterality: N/A;   LAPAROTOMY N/A 08/24/2013   Procedure: EXPLORATORY LAPAROTOMY;  Surgeon: PMerrie Roof MD;  [place] [facility]  Service: General;  Laterality: N/A;  Gram patch closure   litrotripsy  09/24/2013   NEPHROSTOMY  Nov. 2014   nephrostomy removal  Dec. 2014   PERIPHERALLY INSERTED CENTRAL CATHETER INSERTION     TONSILLECTOMY     UMBILICAL HERNIA REPAIR     50 yr ago   [place]   Procedure: LAPAROSCOPIC ASSISTED VENTRAL HERNIA REPAIR WITH MESH;  Surgeon: Autumn Messing III, MD;  Location: [facility];  Service: General;  Laterality: N/A;    Home Medications: Prior to Admission medications   Medication Sig Start Date End Date Taking? Authorizing Provider  amLODipine (NORVASC) 5 MG tablet TAKE 1 TABLET BY MOUTH  DAILY 12/24/19  Yes Kris Hartmann, NP  cholecalciferol (VITAMIN D) 1000 UNITS tablet Take 1,000 Units by mouth daily.   Yes [provider]  donepezil (ARICEPT) 10 MG tablet Take 10 mg by mouth at bedtime.   Yes [provider]  FOLIC ACID PO Take by mouth.   Yes [provider]  gabapentin (NEURONTIN) 100 MG capsule   04/01/17  Yes [provider]  Lactobacillus ([employer] PROBIOTIC ACIDOPHILUS) 10 MG CAPS Take by mouth.   Yes [provider]  pantoprazole (PROTONIX) 40 MG tablet Take 40 mg by mouth at bedtime.  11/13/13  Yes [provider]  simvastatin (ZOCOR) 20 MG tablet Take 20 mg by mouth at bedtime.   Yes [provider]  XARELTO 20 MG TABS tablet daily.  12/23/13  Yes [provider]    Allergies: No Known Allergies  Social History:  reports that he has been smoking cigarettes. He has a 40.00 pack-year smoking history. He has never used smokeless tobacco. He reports current alcohol use of about 3.0 standard drinks of alcohol per week. He reports that he does not use drugs.   Family History: Family History  Problem Relation Age of Onset   CVA Father    Atrial fibrillation Sister     Review of Systems: Review of Systems  Constitutional:  Negative for chills and fever.  HENT:  Negative for hearing loss.   Respiratory:  Negative for shortness of breath.   Cardiovascular:  Negative for chest pain.  Gastrointestinal:  Negative for abdominal pain, nausea and vomiting.  Genitourinary:  Negative for dysuria.  Musculoskeletal:  Negative for myalgias.  Skin:  Negative for rash.  Neurological:  Negative for dizziness.  Psychiatric/Behavioral:  Negative for depression.     Physical Exam BP (!) 145/99   Pulse 71   Temp 98.2 F (36.8 C) (Oral)   Ht 5' 11"$  (1.803 m)   Wt 198 lb (89.8 kg)   SpO2 97%   BMI 27.62 kg/m  CONSTITUTIONAL: No acute distress, well nourished. HEENT:  Normocephalic, atraumatic, extraocular motion intact. NECK: Trachea is midline, and there is no jugular venous distension.  RESPIRATORY:  Lungs are clear, and breath sounds are equal bilaterally. Normal respiratory effort without pathologic use of accessory muscles. CARDIOVASCULAR: Heart is regular without murmurs, gallops, or rubs. GI: The abdomen is soft, nondistended, currently  nontender to palpation.  Patient has moderate-sized bilateral inguinal hernias.  Both contain bowel.  Both are reducible and the patient denies any discomfort when reducing hernias.  He has well-healed scars in the right lower quadrant and midline from prior surgeries.   MUSCULOSKELETAL:  Normal muscle strength and tone in all four extremities.  No peripheral edema or cyanosis. SKIN: Skin turgor is normal. There are no pathologic skin lesions.  NEUROLOGIC:  Motor and sensation is grossly normal.  Cranial nerves are grossly intact. PSYCH:  Alert and oriented to person, place and time. Affect is normal.  Laboratory Analysis: Labs from 10/20/2022: Sodium 140, potassium 3.9, chloride 107, CO2 27, BUN 28, creatinine 2.5.  Total bilirubin 0.8, AST 19, ALT 16, Phosphatase 52, albumin 4.1.  WBC 11.6, hemoglobin 15.5, hematocrit 48.7, platelets 167.  Imaging: CT renal stone study on  10/20/2022: IMPRESSION: 1. Negative for hydronephrosis or ureteral stone. Punctate nonobstructing right kidney stones. 2. New since 2014 are moderate bilateral inguinal hernias. Hernia on the left contains fat and sigmoid colon but no evidence for obstruction or incarceration. Hernia on the right contains mesenteric fat and small bowel, there is soft tissue stranding and mesenteric edema within the right hernia sac concerning for a degree of incarceration. No upstream obstruction. 3. Status post aorto iliac stents. 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 Additional finding of upper abdominal aortic aneurysm superior to the stent graft at the level of the renal arteries. 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 Vascular surgery follow-up/referral is recommended which may be performed non emergently unless otherwise indicated. 4. Gastric wall thickening, question gastritis. Correlation with endoscopy as indicated 5. Gallstones. Extensive  diverticular disease of the colon without acute inflammatory process. 6. 15 mm hyperdense lesion exophytic to upper pole right kidney probably a hemorrhagic or proteinaceous cyst but may be assessed with nonemergent ultrasound.    Assessment and Plan: This is a 77 y.o. male with bilateral inguinal hernias.  - Discussed with patient the findings on his CT scan with the 2 inguinal hernias containing small bowel on the right side and sigmoid colon on the left side.  On today's exam, both are reducible without any tenderness.  Overall the patient reports that she is relatively asymptomatic.  He has noted some discomfort when trying to golf more based on the size of the hernias. - Discussed with patient that given that his hernias contain intestine, I would recommend that we proceed with repair of his bilateral inguinal hernias even if they are asymptomatic.  Given the hernia contents, there is a risk of small bowel obstruction, or incarceration/strangulation of the intestine which would require an emergent surgery.  He is in agreement and would like to proceed with surgical repair. - Discussed with patient then the plan for a robotic assisted bilateral inguinal hernia repair.  Reviewed the surgery at length with him including the planned incisions, risks of bleeding, infection, injury to surrounding structures, that this would be an outpatient surgery, postoperative activity restrictions, pain control, and he is willing to proceed. - We will tentatively schedule him for surgery on 11/17/2022.  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  Will send clearance form to Dr. Delana Meyer to see if it is okay to stop his Xarelto prior to surgery.  On CT scan also there were findings related to his prior AAA repair.  Have messaged Dr. Delana Meyer to make sure that these issues do not need more urgent attention compared to the hernia repair.  I spent 60 minutes dedicated to the care of this patient  on the date of this encounter to include pre-visit review of records, face-to-face time with the patient discussing diagnosis and management, and any post-visit coordination of care.   Melvyn Neth, [facility]

## 2022-10-31 NOTE — Telephone Encounter (Signed)
Patient's wife called about a clearance form  for surgery that was sent to Dr. Delana Meyer and to see if he needed to come in.  Please advise

## 2022-10-31 NOTE — Patient Instructions (Addendum)
Our surgery scheduler will call you within 24-48 hours. Please have the [facility] surgery sheet available when speaking with her.  Vascular Clearance has been faxed to Dr.greg Schnier.  Inguinal Hernia, Adult An inguinal hernia develops when fat or the intestines push through a weak spot in a muscle where the leg meets the lower abdomen (groin). This creates a bulge. This kind of hernia could also be: In the scrotum, if you are male. In folds of skin around the vagina, if you are male. There are three types of inguinal hernias: Hernias that can be pushed back into the abdomen (are reducible). This type rarely causes pain. Hernias that are not reducible (are incarcerated). Hernias that are not reducible and lose their blood supply (are strangulated). This type of hernia requires emergency surgery. What are the causes? This condition is caused by having a weak spot in the muscles or tissues in your groin. This develops over time. The hernia may poke through the weak spot when you suddenly strain your lower abdominal muscles, such as when you: Lift a heavy object. Strain to have a bowel movement. Constipation can lead to straining. Cough. What increases the risk? This condition is more likely to develop in: Males. Pregnant females. People who: Are overweight. Work in jobs that require long periods of standing or heavy lifting. Have had an inguinal hernia before. Smoke or have lung disease. These factors can lead to long-term (chronic) coughing. What are the signs or symptoms? Symptoms may depend on the size of the hernia. Often, a small inguinal hernia has no symptoms. Symptoms of a larger hernia may include: A bulge in the groin area. This is easier to see when standing. It might not be visible when lying down. Pain or burning in the groin. This may get worse when lifting, straining, or coughing. A dull ache or a feeling of pressure in the groin. An unusual bulge in the scrotum, in  males. Symptoms of a strangulated inguinal hernia may include: A bulge in your groin that is very painful and tender to the touch. A bulge that turns red or purple. Fever, nausea, and vomiting. Inability to have a bowel movement or to pass gas. How is this diagnosed? This condition is diagnosed based on your symptoms, your medical history, and a physical exam. Your health care provider may feel your groin area and ask you to cough. How is this treated? Treatment depends on the size of your hernia and whether you have symptoms. If you do not have symptoms, your health care provider may have you watch your hernia carefully and have you come in for follow-up visits. If your hernia is large or if you have symptoms, you may need surgery to repair the hernia. Follow these instructions at home: Lifestyle Avoid lifting heavy objects. Avoid standing for long periods of time. Do not use any products that contain nicotine or tobacco. These products include cigarettes, chewing tobacco, and vaping devices, such as e-cigarettes. If you need help quitting, ask your health care provider. Maintain a healthy weight. Preventing constipation You may need to take these actions to prevent or treat constipation: Drink enough fluid to keep your urine pale yellow. Take over-the-counter or prescription medicines. Eat foods that are high in fiber, such as beans, whole grains, and fresh fruits and vegetables. Limit foods that are high in fat and processed sugars, such as fried or sweet foods. General instructions You may try to push the hernia back in place by very gently pressing on  it while lying down. Do not try to force the bulge back in if it will not push in easily. Watch your hernia for any changes in shape, size, or color. Get help right away if you notice any changes. Take over-the-counter and prescription medicines only as told by your health care provider. Keep all follow-up visits. This is  important. Contact a health care provider if: You have a fever or chills. You develop new symptoms. Your symptoms get worse. Get help right away if: You have pain in your groin that suddenly gets worse. You have a bulge in your groin that: Suddenly gets bigger and does not get smaller. Becomes red or purple or painful to the touch. You are a man and you have a sudden pain in your scrotum, or the size of your scrotum suddenly changes. You cannot push the hernia back in place by very gently pressing on it when you are lying down. You have nausea or vomiting that does not go away. You have a fast heartbeat. You cannot have a bowel movement or pass gas. These symptoms may represent a serious problem that is an emergency. Do not wait to see if the symptoms will go away. Get medical help right away. Call your local emergency services (911 in the [place] Summary An inguinal hernia develops when fat or the intestines push through a weak spot in a muscle where your leg meets your lower abdomen (groin). This condition is caused by having a weak spot in muscles or tissues in your groin. Symptoms may depend on the size of the hernia, and they may include pain or swelling in your groin. A small inguinal hernia often has no symptoms. Treatment may not be needed if you do not have symptoms. If you have symptoms or a large hernia, you may need surgery to repair the hernia. Avoid lifting heavy objects. Also, avoid standing for long periods of time. This information is not intended to replace advice given to you by your health care provider. Make sure you discuss any questions you have with your health care provider. Document Revised: 05/12/2020 Document Reviewed: 05/12/2020 Elsevier Patient Education  [employer].

## 2022-10-31 NOTE — Progress Notes (Signed)
10/31/2022  Reason for Visit:  Bilateral inguinal hernia  History of Present Illness: Nathan Rivera is a 77 y.o. male presenting for evaluation of bilateral inguinal hernias.  The patient presented to emergency room on 10/20/2022 when he had a sudden onset of lower abdominal and groin pain.  He reports that day he was not feeling well overall and then after showering he noticed the significant swelling in the groin areas and presented to the ED.  In the ED they were able to reduce his hernias.  He had a CT scan of abdomen/pelvis which showed moderate sized hernias in bilateral groins.  The right inguinal hernia contained loops of small bowel and the left inguinal hernia contained a loop of sigmoid colon.  No obstruction evidence.  Today he reports that he has not had any significant pain the groin areas since his ED visit.  Denies any current nausea or vomiting, abdominal pain, or constipation.  He does report that he had a day of diarrhea after he came to the ED.    Of note, he has a history of what sounds like a right inguinal hernia when he was an infant, prior exlap for gastric perforation and subsequent lap ventral hernia repair.  Past Medical History: Past Medical History:  Diagnosis Date   Aortic aneurysm (HCC)    Arthritis    knees are the biggest problem   Cancer (HCC) 2012   bladder   CKD (chronic kidney disease), stage III (HCC) 2014   relative to infection, septic- renal calculi   Degenerative disc disease    Degenerative joint disease    GERD (gastroesophageal reflux disease)    Hypercholesterolemia    Hypertension    no meds currently   Impaired glucose tolerance    Muscle fatigue    muscle fatigue relative to sepsis experience    Peripheral vascular disease (HCC)    Pneumonia    Raynaud's disease    Sepsis (HCC) Nov. 2014   Thromboembolism of left popilteal vein 2015   pt. unsure which leg, treating with Xarelto   UTI (urinary tract infection) 08/17/13   Lt  nephrostomy tube and stent placement     Past Surgical History: Past Surgical History:  Procedure Laterality Date   ABDOMINAL AORTIC ANEURYSM REPAIR  2011   CATARACT EXTRACTION W/PHACO Left 02/15/2021   Procedure: CATARACT EXTRACTION PHACO AND INTRAOCULAR LENS PLACEMENT (IOC) LEFT 5.59 00:44.0;  Surgeon: King, Bradley Mark, MD;  Location: [facility];  Service: Ophthalmology;  Laterality: Left;   CATARACT EXTRACTION W/PHACO Right 03/08/2021   Procedure: CATARACT EXTRACTION PHACO AND INTRAOCULAR LENS PLACEMENT (IOC) RIGHT 1.81 00:22.7;  Surgeon: King, Bradley Mark, MD;  Location: [facility];  Service: Ophthalmology;  Laterality: Right;   HERNIA REPAIR     6mo old   INSERTION OF MESH N/A 10/24/2014   Procedure: INSERTION OF MESH;  Surgeon: Paul Toth III, MD;  Location: [facility];  Service: General;  Laterality: N/A;   LAPAROTOMY N/A 08/24/2013   Procedure: EXPLORATORY LAPAROTOMY;  Surgeon: Paul S Toth III, MD;  Location: [facility] OR;  Service: General;  Laterality: N/A;  Gram patch closure   litrotripsy  09/24/2013   NEPHROSTOMY  Nov. 2014   nephrostomy removal  Dec. 2014   PERIPHERALLY INSERTED CENTRAL CATHETER INSERTION     TONSILLECTOMY     UMBILICAL HERNIA REPAIR     50 yr ago   VENTRAL HERNIA REPAIR N/A 10/24/2014   Procedure: LAPAROSCOPIC ASSISTED VENTRAL HERNIA REPAIR WITH MESH;    Surgeon: Paul Toth III, MD;  Location: [facility];  Service: General;  Laterality: N/A;    Home Medications: Prior to Admission medications   Medication Sig Start Date End Date Taking? Authorizing Provider  amLODipine (NORVASC) 5 MG tablet TAKE 1 TABLET BY MOUTH  DAILY 12/24/19  Yes Brown, Fallon E, NP  cholecalciferol (VITAMIN D) 1000 UNITS tablet Take 1,000 Units by mouth daily.   Yes [provider]  donepezil (ARICEPT) 10 MG tablet Take 10 mg by mouth at bedtime.   Yes [provider]  FOLIC ACID PO Take by mouth.   Yes [provider]  gabapentin (NEURONTIN) 100 MG capsule   04/01/17  Yes [provider]  Lactobacillus ([employer] PROBIOTIC ACIDOPHILUS) 10 MG CAPS Take by mouth.   Yes [provider]  pantoprazole (PROTONIX) 40 MG tablet Take 40 mg by mouth at bedtime.  11/13/13  Yes [provider]  simvastatin (ZOCOR) 20 MG tablet Take 20 mg by mouth at bedtime.   Yes [provider]  XARELTO 20 MG TABS tablet daily.  12/23/13  Yes [provider]    Allergies: No Known Allergies  Social History:  reports that he has been smoking cigarettes. He has a 40.00 pack-year smoking history. He has never used smokeless tobacco. He reports current alcohol use of about 3.0 standard drinks of alcohol per week. He reports that he does not use drugs.   Family History: Family History  Problem Relation Age of Onset   CVA Father    Atrial fibrillation Sister     Review of Systems: Review of Systems  Constitutional:  Negative for chills and fever.  HENT:  Negative for hearing loss.   Respiratory:  Negative for shortness of breath.   Cardiovascular:  Negative for chest pain.  Gastrointestinal:  Negative for abdominal pain, nausea and vomiting.  Genitourinary:  Negative for dysuria.  Musculoskeletal:  Negative for myalgias.  Skin:  Negative for rash.  Neurological:  Negative for dizziness.  Psychiatric/Behavioral:  Negative for depression.     Physical Exam BP (!) 145/99   Pulse 71   Temp 98.2 F (36.8 C) (Oral)   Ht 5' 11" (1.803 m)   Wt 198 lb (89.8 kg)   SpO2 97%   BMI 27.62 kg/m  CONSTITUTIONAL: No acute distress, well nourished. HEENT:  Normocephalic, atraumatic, extraocular motion intact. NECK: Trachea is midline, and there is no jugular venous distension.  RESPIRATORY:  Lungs are clear, and breath sounds are equal bilaterally. Normal respiratory effort without pathologic use of accessory muscles. CARDIOVASCULAR: Heart is regular without murmurs, gallops, or rubs. GI: The abdomen is soft, nondistended, currently  nontender to palpation.  Patient has moderate-sized bilateral inguinal hernias.  Both contain bowel.  Both are reducible and the patient denies any discomfort when reducing hernias.  He has well-healed scars in the right lower quadrant and midline from prior surgeries.   MUSCULOSKELETAL:  Normal muscle strength and tone in all four extremities.  No peripheral edema or cyanosis. SKIN: Skin turgor is normal. There are no pathologic skin lesions.  NEUROLOGIC:  Motor and sensation is grossly normal.  Cranial nerves are grossly intact. PSYCH:  Alert and oriented to person, place and time. Affect is normal.  Laboratory Analysis: Labs from 10/20/2022: Sodium 140, potassium 3.9, chloride 107, CO2 27, BUN 28, creatinine 2.5.  Total bilirubin 0.8, AST 19, ALT 16, Phosphatase 52, albumin 4.1.  WBC 11.6, hemoglobin 15.5, hematocrit 48.7, platelets 167.  Imaging: CT renal stone study on   10/20/2022: IMPRESSION: 1. Negative for hydronephrosis or ureteral stone. Punctate nonobstructing right kidney stones. 2. New since 2014 are moderate bilateral inguinal hernias. Hernia on the left contains fat and sigmoid colon but no evidence for obstruction or incarceration. Hernia on the right contains mesenteric fat and small bowel, there is soft tissue stranding and mesenteric edema within the right hernia sac concerning for a degree of incarceration. No upstream obstruction. 3. Status post aorto iliac stents. 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 Additional finding of upper abdominal aortic aneurysm superior to the stent graft at the level of the renal arteries. 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 Vascular surgery follow-up/referral is recommended which may be performed non emergently unless otherwise indicated. 4. Gastric wall thickening, question gastritis. Correlation with endoscopy as indicated 5. Gallstones. Extensive  diverticular disease of the colon without acute inflammatory process. 6. 15 mm hyperdense lesion exophytic to upper pole right kidney probably a hemorrhagic or proteinaceous cyst but may be assessed with nonemergent ultrasound.    Assessment and Plan: This is a 77 y.o. male with bilateral inguinal hernias.  - Discussed with patient the findings on his CT scan with the 2 inguinal hernias containing small bowel on the right side and sigmoid colon on the left side.  On today's exam, both are reducible without any tenderness.  Overall the patient reports that she is relatively asymptomatic.  He has noted some discomfort when trying to golf more based on the size of the hernias. - Discussed with patient that given that his hernias contain intestine, I would recommend that we proceed with repair of his bilateral inguinal hernias even if they are asymptomatic.  Given the hernia contents, there is a risk of small bowel obstruction, or incarceration/strangulation of the intestine which would require an emergent surgery.  He is in agreement and would like to proceed with surgical repair. - Discussed with patient then the plan for a robotic assisted bilateral inguinal hernia repair.  Reviewed the surgery at length with him including the planned incisions, risks of bleeding, infection, injury to surrounding structures, that this would be an outpatient surgery, postoperative activity restrictions, pain control, and he is willing to proceed. - We will tentatively schedule him for surgery on 11/17/2022.  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  Will send clearance form to Dr. Schnier to see if it is okay to stop his Xarelto prior to surgery.  On CT scan also there were findings related to his prior AAA repair.  Have messaged Dr. Schnier to make sure that these issues do not need more urgent attention compared to the hernia repair.  I spent 60 minutes dedicated to the care of this patient  on the date of this encounter to include pre-visit review of records, face-to-face time with the patient discussing diagnosis and management, and any post-visit coordination of care.   Geovani Tootle Luis Hunter Pinkard, MD [facility]    

## 2022-10-31 NOTE — Telephone Encounter (Signed)
Left a detailed message on patient spouse voicemail informing that her husband will not to schedule an appointment to seen. Clearance will be faxed back once signed.

## 2022-11-01 ENCOUNTER — Telehealth: Payer: Self-pay

## 2022-11-01 NOTE — Telephone Encounter (Signed)
Message left for the patient to call back to get surgery information and directions for holding Xarelto.  Surgery date at [facility].  Surgery Date: 11/17/22 Preadmission Testing Date: 11/09/22 (phone 8A-12P)  Patient has been made aware to call [phone], between 1-3:00pm the day before surgery, to find out what time to arrive for surgery.    Patient will need to stop his Xarelto 3 days before surgery. His last dose should be on 11/13/22.

## 2022-11-01 NOTE — Progress Notes (Signed)
Vascular clearance has been received from Dr Nino Parsley office. The patient may stop Xarelto 3 days prior to surgery and resume 2 days after. He is cleared for surgery at Low risk.

## 2022-11-01 NOTE — Telephone Encounter (Signed)
Patient calls back, he is informed of all dates of surgery and informed to stop his Xarelto 3 days prior to surgery.

## 2022-11-09 ENCOUNTER — Encounter
Admission: RE | Admit: 2022-11-09 | Discharge: 2022-11-09 | Disposition: A | Discharge status: Home / Self Care | Payer: Medicare Other | Source: Ambulatory Visit | Attending: Surgery | Admitting: Surgery

## 2022-11-09 ENCOUNTER — Other Ambulatory Visit: Payer: Self-pay

## 2022-11-09 HISTORY — DX: Personal history of urinary calculi: Z87.442

## 2022-11-09 HISTORY — DX: Cerebral infarction, unspecified: I63.9

## 2022-11-09 NOTE — Patient Instructions (Addendum)
Your procedure is scheduled on: 11/17/22 - Thursday Report to the Registration Desk on the 1st floor of the [facility]. To find out your arrival time, please call [phone] between 1PM - 3PM on: 11/16/22 - Wednesday If your arrival time is 6:00 am, do not arrive before that time as the [facility] entrance doors do not open until 6:00 am.  REMEMBER: Instructions that are not followed completely may result in serious medical risk, up to and including death; or upon the discretion of your surgeon and anesthesiologist your surgery may need to be rescheduled.  Do not eat food after midnight the night before surgery.  No gum chewing or hard candies.  You may however, drink CLEAR liquids up to 2 hours before you are scheduled to arrive for your surgery. Do not drink anything within 2 hours of your scheduled arrival time.  Clear liquids include: - water  - apple juice without pulp - gatorade (not RED colors) - black coffee or tea (Do NOT add milk or creamers to the coffee or tea) Do NOT drink anything that is not on this list.  One week prior to surgery: Stop taking beginning 11/11/22, Anti-inflammatories (NSAIDS) such as Advil, Aleve, Ibuprofen, Motrin, Naproxen, Naprosyn and Aspirin based products such as Excedrin, Goody's Powder, BC Powder.  Stop taking beginning 11/11/22, ANY OVER THE COUNTER supplements until after surgery: cholecalciferol (VITAMIN D) , FOLIC ACID .   You may take Tylenol if needed for pain up until the day of surgery.  Continue taking all prescribed medications with the exception of the following:  - XARELTO 20 MG - stop beginning 11/14/22, resume taking on 11/19/22 per doctors instructions.   TAKE ONLY THESE MEDICATIONS THE MORNING OF SURGERY WITH A SIP OF WATER:  pantoprazole (PROTONIX)  (take one the night before and one on the morning of surgery - helps to prevent nausea after surgery.) amLODipine (NORVASC)    No Alcohol for 24 hours before or after  surgery.  No Smoking including e-cigarettes for 24 hours before surgery.  No chewable tobacco products for at least 6 hours before surgery.  No nicotine patches on the day of surgery.  Do not use any "recreational" drugs for at least a week (preferably 2 weeks) before your surgery.  Please be advised that the combination of cocaine and anesthesia may have negative outcomes, up to and including death. If you test positive for cocaine, your surgery will be cancelled.  On the morning of surgery brush your teeth with toothpaste and water, you may rinse your mouth with mouthwash if you wish. Do not swallow any toothpaste or mouthwash.  Use CHG Soap or wipes as directed on instruction sheet.  Do not wear jewelry, make-up, hairpins, clips or nail polish.  Do not wear lotions, powders, or perfumes.   Do not shave body hair from the neck down 48 hours before surgery.  Contact lenses, hearing aids and dentures may not be worn into surgery.  Do not bring valuables to the hospital. [facility] is not responsible for any missing/lost belongings or valuables.   Notify your doctor if there is any change in your medical condition (cold, fever, infection).  Wear comfortable clothing (specific to your surgery type) to the hospital.  After surgery, you can help prevent lung complications by doing breathing exercises.  Take deep breaths and cough every 1-2 hours. Your doctor may order a device called an Incentive Spirometer to help you take deep breaths. When coughing or sneezing, hold a  pillow firmly against your incision with both hands. This is called "splinting." Doing this helps protect your incision. It also decreases belly discomfort.  If you are being admitted to the hospital overnight, leave your suitcase in the car. After surgery it may be brought to your room.  In case of increased patient census, it may be necessary for you, the patient, to continue your postoperative care in the Same Day  Surgery department.  If you are being discharged the day of surgery, you will not be allowed to drive home. You will need a responsible individual to drive you home and stay with you for 24 hours after surgery.   If you are taking public transportation, you will need to have a responsible individual with you.  Please call the [facility] Dept. at [phone] if you have any questions about these instructions.  Surgery Visitation Policy:  Patients undergoing a surgery or procedure may have two family members or support persons with them as long as the person is not COVID-19 positive or experiencing its symptoms.   Inpatient Visitation:    Visiting hours are 7 a.m. to 8 p.m. Up to four visitors are allowed at one time in a patient room. The visitors may rotate out with other people during the day. One designated support person (adult) may remain overnight.  Due to an increase in RSV and influenza rates and associated hospitalizations, children ages 44 and under will not be able to visit patients in [facility].  Masks continue to be strongly recommended.     Preparing for Surgery with CHLORHEXIDINE GLUCONATE (CHG) Soap  Chlorhexidine Gluconate (CHG) Soap  o An antiseptic cleaner that kills germs and bonds with the skin to continue killing germs even after washing  o Used for showering the night before surgery and morning of surgery  Before surgery, you can play an important role by reducing the number of germs on your skin.  CHG (Chlorhexidine gluconate) soap is an antiseptic cleanser which kills germs and bonds with the skin to continue killing germs even after washing.  Please do not use if you have an allergy to CHG or antibacterial soaps. If your skin becomes reddened/irritated stop using the CHG.  1. Shower the NIGHT BEFORE SURGERY and the MORNING OF SURGERY with CHG soap.  2. If you choose to wash your hair, wash your hair first as usual with your  normal shampoo.  3. After shampooing, rinse your hair and body thoroughly to remove the shampoo.  4. Use CHG as you would any other liquid soap. You can apply CHG directly to the skin and wash gently with a scrungie or a clean washcloth.  5. Apply the CHG soap to your body only from the neck down. Do not use on open wounds or open sores. Avoid contact with your eyes, ears, mouth, and genitals (private parts). Wash face and genitals (private parts) with your normal soap.  6. Wash thoroughly, paying special attention to the area where your surgery will be performed.  7. Thoroughly rinse your body with warm water.  8. Do not shower/wash with your normal soap after using and rinsing off the CHG soap.  9. Pat yourself dry with a clean towel.  10. Wear clean pajamas to bed the night before surgery.  12. Place clean sheets on your bed the night of your first shower and do not sleep with pets.  13. Shower again with the CHG soap on the day of surgery prior to  arriving at the hospital.  14. Do not apply any deodorants/lotions/powders.  15. Please wear clean clothes to the hospital.

## 2022-11-11 ENCOUNTER — Encounter
Admission: RE | Admit: 2022-11-11 | Discharge: 2022-11-11 | Disposition: A | Discharge status: Home / Self Care | Payer: Medicare Other | Source: Ambulatory Visit | Attending: Surgery | Admitting: Surgery

## 2022-11-11 DIAGNOSIS — I10 Essential (primary) hypertension: Secondary | ICD-10-CM | POA: Diagnosis not present

## 2022-11-11 DIAGNOSIS — Z01810 Encounter for preprocedural cardiovascular examination: Secondary | ICD-10-CM | POA: Diagnosis present

## 2022-11-16 MED ORDER — BUPIVACAINE HCL (PF) 0.5 % IJ SOLN
INTRAMUSCULAR | Status: AC
  Filled 2022-11-16: qty 30

## 2022-11-16 MED ORDER — EPINEPHRINE PF 1 MG/ML IJ SOLN
INTRAMUSCULAR | Status: AC
  Filled 2022-11-16: qty 1

## 2022-11-16 MED ORDER — BUPIVACAINE LIPOSOME 1.3 % IJ SUSP
INTRAMUSCULAR | Status: AC
  Filled 2022-11-16: qty 20

## 2022-11-17 ENCOUNTER — Ambulatory Visit: Payer: Medicare Other | Admitting: Urgent Care

## 2022-11-17 ENCOUNTER — Encounter
Admission: RE | Disposition: A | Discharge status: Home / Self Care | Payer: Self-pay | Source: Home / Self Care | Attending: Surgery

## 2022-11-17 ENCOUNTER — Other Ambulatory Visit: Payer: Self-pay

## 2022-11-17 ENCOUNTER — Observation Stay
Admission: RE | Admit: 2022-11-17 | Discharge: 2022-11-18 | Disposition: A | Discharge status: Home / Self Care | Payer: Medicare Other | Attending: Surgery | Admitting: Surgery

## 2022-11-17 ENCOUNTER — Encounter: Payer: Self-pay | Admitting: Surgery

## 2022-11-17 DIAGNOSIS — N183 Chronic kidney disease, stage 3 unspecified: Secondary | ICD-10-CM | POA: Diagnosis not present

## 2022-11-17 DIAGNOSIS — I129 Hypertensive chronic kidney disease with stage 1 through stage 4 chronic kidney disease, or unspecified chronic kidney disease: Secondary | ICD-10-CM | POA: Insufficient documentation

## 2022-11-17 DIAGNOSIS — Z79899 Other long term (current) drug therapy: Secondary | ICD-10-CM | POA: Diagnosis not present

## 2022-11-17 DIAGNOSIS — K4090 Unilateral inguinal hernia, without obstruction or gangrene, not specified as recurrent: Secondary | ICD-10-CM

## 2022-11-17 DIAGNOSIS — R112 Nausea with vomiting, unspecified: Secondary | ICD-10-CM | POA: Diagnosis present

## 2022-11-17 DIAGNOSIS — R2681 Unsteadiness on feet: Secondary | ICD-10-CM | POA: Diagnosis not present

## 2022-11-17 DIAGNOSIS — I10 Essential (primary) hypertension: Secondary | ICD-10-CM

## 2022-11-17 DIAGNOSIS — K4091 Unilateral inguinal hernia, without obstruction or gangrene, recurrent: Secondary | ICD-10-CM | POA: Diagnosis not present

## 2022-11-17 DIAGNOSIS — Z8719 Personal history of other diseases of the digestive system: Secondary | ICD-10-CM

## 2022-11-17 DIAGNOSIS — F17210 Nicotine dependence, cigarettes, uncomplicated: Secondary | ICD-10-CM | POA: Insufficient documentation

## 2022-11-17 DIAGNOSIS — Z8551 Personal history of malignant neoplasm of bladder: Secondary | ICD-10-CM | POA: Diagnosis not present

## 2022-11-17 DIAGNOSIS — K4020 Bilateral inguinal hernia, without obstruction or gangrene, not specified as recurrent: Secondary | ICD-10-CM

## 2022-11-17 DIAGNOSIS — Z9889 Other specified postprocedural states: Secondary | ICD-10-CM | POA: Diagnosis present

## 2022-11-17 DIAGNOSIS — K4021 Bilateral inguinal hernia, without obstruction or gangrene, recurrent: Principal | ICD-10-CM | POA: Insufficient documentation

## 2022-11-17 HISTORY — PX: XI ROBOTIC ASSISTED INGUINAL HERNIA REPAIR WITH MESH: SHX6706

## 2022-11-17 LAB — BASIC METABOLIC PANEL
Anion gap: 11 (ref 5–15)
BUN: 31 mg/dL — ABNORMAL HIGH (ref 8–23)
CO2: 23 mmol/L (ref 22–32)
Calcium: 8.5 mg/dL — ABNORMAL LOW (ref 8.9–10.3)
Chloride: 103 mmol/L (ref 98–111)
Creatinine, Ser: 1.58 mg/dL — ABNORMAL HIGH (ref 0.61–1.24)
GFR, Estimated: 45 mL/min — ABNORMAL LOW (ref >60)
Glucose, Bld: 143 mg/dL — ABNORMAL HIGH (ref 70–99)
Potassium: 3.9 mmol/L (ref 3.5–5.1)
Sodium: 137 mmol/L (ref 135–145)

## 2022-11-17 LAB — MAGNESIUM: Magnesium: 1.8 mg/dL (ref 1.7–2.4)

## 2022-11-17 SURGERY — REPAIR, HERNIA, INGUINAL, ROBOT-ASSISTED, LAPAROSCOPIC, USING MESH
Anesthesia: General | Laterality: Bilateral

## 2022-11-17 MED ORDER — 0.9 % SODIUM CHLORIDE (POUR BTL) OPTIME
TOPICAL | Status: DC | PRN
  Administered 2022-11-17: 500 mL

## 2022-11-17 MED ORDER — GABAPENTIN 100 MG PO CAPS
200.0000 mg | ORAL_CAPSULE | Freq: Two times a day (BID) | ORAL | Status: DC
  Administered 2022-11-17 – 2022-11-18 (×2): 200 mg via ORAL
  Filled 2022-11-17 (×2): qty 2

## 2022-11-17 MED ORDER — BUPIVACAINE LIPOSOME 1.3 % IJ SUSP: 20.0000 mL | Freq: Once | INTRAMUSCULAR | Status: DC

## 2022-11-17 MED ORDER — ONDANSETRON HCL 4 MG PO TABS: 4.0000 mg | ORAL_TABLET | Freq: Three times a day (TID) | ORAL | 0 refills | Status: DC | PRN

## 2022-11-17 MED ORDER — LACTATED RINGERS IV SOLN: INTRAVENOUS | Status: DC

## 2022-11-17 MED ORDER — CHLORHEXIDINE GLUCONATE 0.12 % MT SOLN
OROMUCOSAL | Status: AC
  Administered 2022-11-17: 15 mL via OROMUCOSAL
  Filled 2022-11-17: qty 15

## 2022-11-17 MED ORDER — ACETAMINOPHEN 500 MG PO TABS
ORAL_TABLET | ORAL | Status: AC
  Administered 2022-11-17: 1000 mg via ORAL
  Filled 2022-11-17: qty 2

## 2022-11-17 MED ORDER — SIMVASTATIN 20 MG PO TABS
20.0000 mg | ORAL_TABLET | Freq: Every day | ORAL | Status: DC
  Administered 2022-11-17: 20 mg via ORAL
  Filled 2022-11-17: qty 1

## 2022-11-17 MED ORDER — BUPIVACAINE HCL (PF) 0.5 % IJ SOLN
INTRAMUSCULAR | Status: AC
  Filled 2022-11-17: qty 30

## 2022-11-17 MED ORDER — POLYETHYLENE GLYCOL 3350 17 G PO PACK: 17.0000 g | PACK | Freq: Every day | ORAL | Status: DC | PRN

## 2022-11-17 MED ORDER — CHLORHEXIDINE GLUCONATE CLOTH 2 % EX PADS
6.0000 | MEDICATED_PAD | Freq: Once | CUTANEOUS | Status: AC
  Administered 2022-11-17: 6 via TOPICAL

## 2022-11-17 MED ORDER — LIDOCAINE HCL (CARDIAC) PF 100 MG/5ML IV SOSY
PREFILLED_SYRINGE | INTRAVENOUS | Status: DC | PRN
  Administered 2022-11-17: 100 mg via INTRAVENOUS

## 2022-11-17 MED ORDER — DEXAMETHASONE SODIUM PHOSPHATE 10 MG/ML IJ SOLN
INTRAMUSCULAR | Status: DC | PRN
  Administered 2022-11-17: 10 mg via INTRAVENOUS

## 2022-11-17 MED ORDER — PROPOFOL 10 MG/ML IV BOLUS
INTRAVENOUS | Status: DC | PRN
  Administered 2022-11-17: 30 mg via INTRAVENOUS
  Administered 2022-11-17: 120 mg via INTRAVENOUS

## 2022-11-17 MED ORDER — CEFAZOLIN SODIUM-DEXTROSE 2-4 GM/100ML-% IV SOLN
2.0000 g | INTRAVENOUS | Status: AC
  Administered 2022-11-17: 2 g via INTRAVENOUS

## 2022-11-17 MED ORDER — ONDANSETRON 4 MG PO TBDP: 4.0000 mg | ORAL_TABLET | Freq: Four times a day (QID) | ORAL | Status: DC | PRN

## 2022-11-17 MED ORDER — PANTOPRAZOLE SODIUM 40 MG IV SOLR
40.0000 mg | Freq: Every day | INTRAVENOUS | Status: DC
  Administered 2022-11-17: 40 mg via INTRAVENOUS
  Filled 2022-11-17: qty 10

## 2022-11-17 MED ORDER — CHLORHEXIDINE GLUCONATE 0.12 % MT SOLN: 15.0000 mL | Freq: Once | OROMUCOSAL | Status: AC

## 2022-11-17 MED ORDER — PROPOFOL 10 MG/ML IV BOLUS
INTRAVENOUS | Status: AC
  Filled 2022-11-17: qty 40

## 2022-11-17 MED ORDER — OXYCODONE HCL 5 MG/5ML PO SOLN: 5.0000 mg | Freq: Once | ORAL | Status: DC | PRN

## 2022-11-17 MED ORDER — FENTANYL CITRATE (PF) 100 MCG/2ML IJ SOLN
INTRAMUSCULAR | Status: DC | PRN
  Administered 2022-11-17 (×4): 50 ug via INTRAVENOUS

## 2022-11-17 MED ORDER — ONDANSETRON HCL 4 MG/2ML IJ SOLN: 4.0000 mg | Freq: Four times a day (QID) | INTRAMUSCULAR | Status: DC | PRN

## 2022-11-17 MED ORDER — FENTANYL CITRATE (PF) 100 MCG/2ML IJ SOLN
INTRAMUSCULAR | Status: AC
  Filled 2022-11-17: qty 2

## 2022-11-17 MED ORDER — FENTANYL CITRATE (PF) 100 MCG/2ML IJ SOLN
25.0000 ug | INTRAMUSCULAR | Status: DC | PRN
  Administered 2022-11-17: 25 ug via INTRAVENOUS

## 2022-11-17 MED ORDER — OXYCODONE HCL 5 MG PO TABS: 5.0000 mg | ORAL_TABLET | ORAL | 0 refills | Status: DC | PRN

## 2022-11-17 MED ORDER — GABAPENTIN 300 MG PO CAPS: 300.0000 mg | ORAL_CAPSULE | ORAL | Status: AC

## 2022-11-17 MED ORDER — EPINEPHRINE PF 1 MG/ML IJ SOLN
INTRAMUSCULAR | Status: AC
  Filled 2022-11-17: qty 1

## 2022-11-17 MED ORDER — ACETAMINOPHEN 500 MG PO TABS: 1000.0000 mg | ORAL_TABLET | Freq: Four times a day (QID) | ORAL | Status: DC | PRN

## 2022-11-17 MED ORDER — ONDANSETRON HCL 4 MG/2ML IJ SOLN
INTRAMUSCULAR | Status: AC
  Filled 2022-11-17: qty 2

## 2022-11-17 MED ORDER — SODIUM CHLORIDE (PF) 0.9 % IJ SOLN
INTRAMUSCULAR | Status: DC | PRN
  Administered 2022-11-17: 60 mL

## 2022-11-17 MED ORDER — OXYCODONE HCL 5 MG PO TABS: 5.0000 mg | ORAL_TABLET | Freq: Once | ORAL | Status: DC | PRN

## 2022-11-17 MED ORDER — GABAPENTIN 300 MG PO CAPS
ORAL_CAPSULE | ORAL | Status: AC
  Administered 2022-11-17: 300 mg via ORAL
  Filled 2022-11-17: qty 1

## 2022-11-17 MED ORDER — ROCURONIUM BROMIDE 100 MG/10ML IV SOLN
INTRAVENOUS | Status: DC | PRN
  Administered 2022-11-17 (×2): 50 mg via INTRAVENOUS

## 2022-11-17 MED ORDER — OXYCODONE HCL 5 MG PO TABS: 5.0000 mg | ORAL_TABLET | ORAL | Status: DC | PRN

## 2022-11-17 MED ORDER — DROPERIDOL 2.5 MG/ML IJ SOLN
INTRAMUSCULAR | Status: AC
  Filled 2022-11-17: qty 2

## 2022-11-17 MED ORDER — BUPIVACAINE LIPOSOME 1.3 % IJ SUSP
INTRAMUSCULAR | Status: AC
  Filled 2022-11-17: qty 20

## 2022-11-17 MED ORDER — FENTANYL CITRATE (PF) 100 MCG/2ML IJ SOLN
INTRAMUSCULAR | Status: AC
  Administered 2022-11-17: 25 ug via INTRAVENOUS
  Filled 2022-11-17: qty 2

## 2022-11-17 MED ORDER — EPHEDRINE SULFATE (PRESSORS) 50 MG/ML IJ SOLN
INTRAMUSCULAR | Status: DC | PRN
  Administered 2022-11-17: 7.5 mg via INTRAVENOUS

## 2022-11-17 MED ORDER — SODIUM CHLORIDE 0.9 % IV SOLN: 12.5000 mg | Freq: Four times a day (QID) | INTRAVENOUS | Status: DC | PRN

## 2022-11-17 MED ORDER — ACETAMINOPHEN 500 MG PO TABS: 1000.0000 mg | ORAL_TABLET | ORAL | Status: AC

## 2022-11-17 MED ORDER — DONEPEZIL HCL 5 MG PO TABS
10.0000 mg | ORAL_TABLET | Freq: Every day | ORAL | Status: DC
  Administered 2022-11-17: 10 mg via ORAL
  Filled 2022-11-17: qty 2

## 2022-11-17 MED ORDER — ONDANSETRON HCL 4 MG/2ML IJ SOLN
INTRAMUSCULAR | Status: DC | PRN
  Administered 2022-11-17: 4 mg via INTRAVENOUS

## 2022-11-17 MED ORDER — ORAL CARE MOUTH RINSE: 15.0000 mL | Freq: Once | OROMUCOSAL | Status: AC

## 2022-11-17 MED ORDER — SODIUM CHLORIDE FLUSH 0.9 % IV SOLN
INTRAVENOUS | Status: AC
  Filled 2022-11-17: qty 10

## 2022-11-17 MED ORDER — CEFAZOLIN SODIUM-DEXTROSE 2-4 GM/100ML-% IV SOLN
INTRAVENOUS | Status: AC
  Filled 2022-11-17: qty 100

## 2022-11-17 MED ORDER — SUGAMMADEX SODIUM 200 MG/2ML IV SOLN
INTRAVENOUS | Status: DC | PRN
  Administered 2022-11-17: 200 mg via INTRAVENOUS

## 2022-11-17 MED ORDER — DROPERIDOL 2.5 MG/ML IJ SOLN
0.6250 mg | Freq: Once | INTRAMUSCULAR | Status: AC
  Administered 2022-11-17: 0.625 mg via INTRAVENOUS

## 2022-11-17 MED ORDER — ONDANSETRON HCL 4 MG/2ML IJ SOLN
4.0000 mg | Freq: Once | INTRAMUSCULAR | Status: AC
  Administered 2022-11-17: 4 mg via INTRAVENOUS

## 2022-11-17 MED ORDER — AMLODIPINE BESYLATE 5 MG PO TABS
5.0000 mg | ORAL_TABLET | Freq: Every day | ORAL | Status: DC
  Administered 2022-11-17 – 2022-11-18 (×2): 5 mg via ORAL
  Filled 2022-11-17 (×2): qty 1

## 2022-11-17 MED ORDER — HYDROMORPHONE HCL 1 MG/ML IJ SOLN: 0.5000 mg | INTRAMUSCULAR | Status: DC | PRN

## 2022-11-17 MED ORDER — ACETAMINOPHEN 500 MG PO TABS
1000.0000 mg | ORAL_TABLET | Freq: Four times a day (QID) | ORAL | Status: DC
  Administered 2022-11-17 – 2022-11-18 (×2): 1000 mg via ORAL
  Filled 2022-11-17 (×2): qty 2

## 2022-11-17 SURGICAL SUPPLY — 58 items
CANNULA REDUC XI 12-8 STAPL (CANNULA) ×1
CANNULA REDUCER 12-8 DVNC XI (CANNULA) ×1 IMPLANT
COVER TIP SHEARS 8 DVNC (MISCELLANEOUS) ×1 IMPLANT
COVER TIP SHEARS 8MM DA VINCI (MISCELLANEOUS) ×1
COVER WAND RF STERILE (DRAPES) ×1 IMPLANT
DERMABOND ADVANCED .7 DNX12 (GAUZE/BANDAGES/DRESSINGS) ×1 IMPLANT
DRAPE ARM DVNC X/XI (DISPOSABLE) ×3 IMPLANT
DRAPE COLUMN DVNC XI (DISPOSABLE) ×1 IMPLANT
DRAPE DA VINCI XI ARM (DISPOSABLE) ×3
DRAPE DA VINCI XI COLUMN (DISPOSABLE) ×1
ELECT CAUTERY BLADE TIP 2.5 (TIP) ×1
ELECT REM PT RETURN 9FT ADLT (ELECTROSURGICAL) ×1
ELECTRODE CAUTERY BLDE TIP 2.5 (TIP) ×1 IMPLANT
ELECTRODE REM PT RTRN 9FT ADLT (ELECTROSURGICAL) ×1 IMPLANT
GLOVE SURG SYN 7.0 (GLOVE) ×2 IMPLANT
GLOVE SURG SYN 7.0 PF PI (GLOVE) ×2 IMPLANT
GLOVE SURG SYN 7.5  E (GLOVE) ×2
GLOVE SURG SYN 7.5 E (GLOVE) ×2 IMPLANT
GLOVE SURG SYN 7.5 PF PI (GLOVE) ×2 IMPLANT
GOWN STRL REUS W/ TWL LRG LVL3 (GOWN DISPOSABLE) ×4 IMPLANT
GOWN STRL REUS W/TWL LRG LVL3 (GOWN DISPOSABLE) ×4
IRRIGATION STRYKERFLOW (MISCELLANEOUS) ×1 IMPLANT
IRRIGATOR STRYKERFLOW (MISCELLANEOUS) ×1
IV NS 1000ML (IV SOLUTION)
IV NS 1000ML BAXH (IV SOLUTION) IMPLANT
KIT PINK PAD W/HEAD ARE REST (MISCELLANEOUS) ×1
KIT PINK PAD W/HEAD ARM REST (MISCELLANEOUS) ×1 IMPLANT
LABEL OR SOLS (LABEL) ×1 IMPLANT
MANIFOLD NEPTUNE II (INSTRUMENTS) ×1 IMPLANT
MESH 3DMAX MID 4X6 LT LRG (Mesh General) IMPLANT
MESH 3DMAX MID 4X6 RT LRG (Mesh General) IMPLANT
NDL INSUFFLATION 14GA 120MM (NEEDLE) ×1 IMPLANT
NEEDLE HYPO 22GX1.5 SAFETY (NEEDLE) ×1 IMPLANT
NEEDLE INSUFFLATION 14GA 120MM (NEEDLE) ×1 IMPLANT
OBTURATOR OPTICAL STANDARD 8MM (TROCAR) ×1
OBTURATOR OPTICAL STND 8 DVNC (TROCAR) ×1
OBTURATOR OPTICALSTD 8 DVNC (TROCAR) ×1 IMPLANT
PACK LAP CHOLECYSTECTOMY (MISCELLANEOUS) ×1 IMPLANT
PENCIL SMOKE EVACUATOR (MISCELLANEOUS) ×1 IMPLANT
SEAL CANN UNIV 5-8 DVNC XI (MISCELLANEOUS) ×3 IMPLANT
SEAL XI 5MM-8MM UNIVERSAL (MISCELLANEOUS) ×3
SET TUBE SMOKE EVAC HIGH FLOW (TUBING) ×1 IMPLANT
SOL ELECTROSURG ANTI STICK (MISCELLANEOUS) ×1
SOLUTION ELECTROSURG ANTI STCK (MISCELLANEOUS) ×1 IMPLANT
SPONGE T-LAP 18X18 ~~LOC~~+RFID (SPONGE) ×1 IMPLANT
STAPLER CANNULA SEAL DVNC XI (STAPLE) ×1 IMPLANT
STAPLER CANNULA SEAL XI (STAPLE) ×1
SUT MNCRL AB 4-0 PS2 18 (SUTURE) ×1 IMPLANT
SUT VIC AB 2-0 SH 27 (SUTURE) ×2
SUT VIC AB 2-0 SH 27XBRD (SUTURE) ×2 IMPLANT
SUT VIC AB 3-0 SH 27 (SUTURE)
SUT VIC AB 3-0 SH 27X BRD (SUTURE) IMPLANT
SUT VICRYL 0 UR6 27IN ABS (SUTURE) ×2 IMPLANT
SUT VLOC 90 S/L VL9 GS22 (SUTURE) ×1 IMPLANT
TAPE TRANSPORE STRL 2 31045 (GAUZE/BANDAGES/DRESSINGS) ×1 IMPLANT
TRAP FLUID SMOKE EVACUATOR (MISCELLANEOUS) ×1 IMPLANT
TRAY FOLEY SLVR 16FR LF STAT (SET/KITS/TRAYS/PACK) ×1 IMPLANT
WATER STERILE IRR 500ML POUR (IV SOLUTION) ×1 IMPLANT

## 2022-11-17 NOTE — Transfer of Care (Signed)
Immediate Anesthesia Transfer of Care Note  Patient: Nathan Rivera  Procedure(s) Performed: XI ROBOTIC ASSISTED INGUINAL HERNIA REPAIR WITH MESH-Bilateral (Bilateral)  Patient Location: PACU  Anesthesia Type:General  Level of Consciousness: awake, alert , and oriented  Airway & Oxygen Therapy: Patient Spontanous Breathing and Patient connected to nasal cannula oxygen  Post-op Assessment: Report given to RN and Post -op Vital signs reviewed and stable  Post vital signs: Reviewed and stable  Last Vitals:  Vitals Value Taken Time  BP 155/78 11/17/22 1640  Temp 36 C 11/17/22 1640  Pulse 85 11/17/22 1643  Resp 17 11/17/22 1643  SpO2 98 % 11/17/22 1643  Vitals shown include unvalidated device data.  Last Pain:  Vitals:   11/17/22 1305  TempSrc: Oral  PainSc: 0-No pain         Complications: No notable events documented.

## 2022-11-17 NOTE — Anesthesia Postprocedure Evaluation (Signed)
Anesthesia Post Note  Patient: DAESHAUN ALLEGRETTO  Procedure(s) Performed: XI ROBOTIC ASSISTED INGUINAL HERNIA REPAIR WITH MESH-Bilateral (Bilateral)  Patient location during evaluation: PACU Anesthesia Type: General Level of consciousness: awake and alert Pain management: pain level controlled Vital Signs Assessment: post-procedure vital signs reviewed and stable Respiratory status: spontaneous breathing, nonlabored ventilation, respiratory function stable and patient connected to nasal cannula oxygen Cardiovascular status: blood pressure returned to baseline and stable Postop Assessment: no apparent nausea or vomiting Anesthetic complications: no   No notable events documented.   Last Vitals:  Vitals:   11/17/22 1845 11/17/22 1900  BP: (!) 160/84 (!) 158/83  Pulse: 78 74  Resp: 20 11  Temp: (!) 36.2 C   SpO2: 97% 98%    Last Pain:  Vitals:   11/17/22 1900  TempSrc:   PainSc: 4                  Martha Clan

## 2022-11-17 NOTE — Interval H&P Note (Signed)
History and Physical Interval Note:  11/17/2022 1:16 PM  Nathan Rivera  has presented today for surgery, with the diagnosis of Bilateral Inguinal Hernia, K40.20.  The various methods of treatment have been discussed with the patient and family. After consideration of risks, benefits and other options for treatment, the patient has consented to  Procedure(s): XI ROBOTIC [place] MESH-Bilateral (Bilateral) as a surgical intervention.  The patient's history has been reviewed, patient examined, no change in status, stable for surgery.  I have reviewed the patient's chart and labs.  Questions were answered to the patient's satisfaction.     Clara Herbison

## 2022-11-17 NOTE — Anesthesia Preprocedure Evaluation (Signed)
Anesthesia Evaluation  Patient identified by MRN, date of birth, ID band Patient awake    Reviewed: Allergy & Precautions, NPO status , Patient's Chart, lab work & pertinent test results  History of Anesthesia Complications Negative for: history of anesthetic complications  Airway Mallampati: III  TM Distance: <3 FB Neck ROM: full    Dental  (+) Chipped, Poor Dentition   Pulmonary neg shortness of breath, Current Smoker and Patient abstained from smoking.   Pulmonary exam normal        Cardiovascular Exercise Tolerance: Good hypertension, (-) angina + Peripheral Vascular Disease  (-) Past MI Normal cardiovascular exam     Neuro/Psych CVA  negative psych ROS   GI/Hepatic Neg liver ROS, PUD,GERD  Controlled,,  Endo/Other  negative endocrine ROS    Renal/GU Renal disease     Musculoskeletal   Abdominal   Peds  Hematology negative hematology ROS (+)   Anesthesia Other Findings Past Medical History: No date: Aortic aneurysm (HCC) No date: Arthritis     Comment:  knees are the biggest problem 2012: Cancer ([place])     Comment:  bladder 2014: CKD (chronic kidney disease), stage III (HCC)     Comment:  relative to infection, septic- renal calculi No date: Degenerative disc disease No date: Degenerative joint disease No date: GERD (gastroesophageal reflux disease) No date: History of kidney stones No date: Hypercholesterolemia No date: Hypertension     Comment:  no meds currently No date: Impaired glucose tolerance No date: Muscle fatigue     Comment:  muscle fatigue relative to sepsis experience  No date: Peripheral vascular disease ([place]) No date: Pneumonia No date: Raynaud's disease 07/2013: Sepsis ([place]) No date: Stroke [facility]) 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 unsure which leg, treating with Xarelto 08/17/2013: UTI (urinary tract infection)     Comment:  Lt nephrostomy tube and  stent placement  Past Surgical History: 2011: ABDOMINAL AORTIC ANEURYSM REPAIR 02/15/2021: CATARACT EXTRACTION W/PHACO; Left     Comment:  Procedure: CATARACT EXTRACTION PHACO AND INTRAOCULAR               LENS PLACEMENT (IOC) LEFT 5.59 00:44.0;  Surgeon: Eulogio Bear, MD;  Location: [place];                Service: Ophthalmology;  Laterality: Left; 03/08/2021: CATARACT EXTRACTION W/PHACO; Right     Comment:  Procedure: CATARACT EXTRACTION PHACO AND INTRAOCULAR               LENS PLACEMENT (IOC) RIGHT 1.81 00:22.7;  Surgeon: Eulogio Bear, MD;  Location: [place];                Service: Ophthalmology;  Laterality: Right; No date: HERNIA REPAIR     Comment:  20moold 10/24/2014: INSERTION OF MESH; N/A     Comment:  Procedure: INSERTION OF MESH;  Surgeon: PAutumn MessingIII,               MD;  [place]  Service: General;  Laterality:               N/A; 08/24/2013: LAPAROTOMY; N/A     Comment:  Procedure: EXPLORATORY LAPAROTOMY;  Surgeon: PLuella Cook  III, MD;  Location: [place];  Service: General;                Laterality: N/A;  Gram patch closure 09/24/2013: litrotripsy Nov. 2014: NEPHROSTOMY Dec. 2014: nephrostomy removal No date: [place] No date: TONSILLECTOMY No date: UMBILICAL HERNIA REPAIR     Comment:  63 yr ago 10/24/2014: VENTRAL HERNIA REPAIR; N/A     Comment:  Procedure: LAPAROSCOPIC ASSISTED [place];  Surgeon: Autumn Messing III, MD;  Location: [place];              Service: General;  Laterality: N/A;  BMI    Body Mass Index: 27.64 kg/m      Reproductive/Obstetrics negative OB ROS                             Anesthesia Physical Anesthesia Plan  ASA: 3  Anesthesia Plan: General ETT   Post-op Pain Management:    Induction: Intravenous  PONV Risk Score and Plan: Ondansetron, Dexamethasone,  Midazolam and Treatment may vary due to age or medical condition  Airway Management Planned: Oral ETT  Additional Equipment:   Intra-op Plan:   Post-operative Plan: Extubation in OR  Informed Consent: I have reviewed the patients History and Physical, chart, labs and discussed the procedure including the risks, benefits and alternatives for the proposed anesthesia with the patient or authorized representative who has indicated his/her understanding and acceptance.     Dental Advisory Given  Plan Discussed with: Anesthesiologist, CRNA and Surgeon  Anesthesia Plan Comments: (Patient consented for risks of anesthesia including but not limited to:  - adverse reactions to medications - damage to eyes, teeth, lips or other oral mucosa - nerve damage due to positioning  - sore throat or hoarseness - Damage to heart, brain, nerves, lungs, other parts of body or loss of life  Patient voiced understanding.)       Anesthesia Quick Evaluation

## 2022-11-17 NOTE — Plan of Care (Signed)
  Problem: Clinical Measurements: Goal: Will remain free from infection Outcome: Progressing   Problem: Activity: Goal: Risk for activity intolerance will decrease Outcome: Progressing   Problem: Nutrition: Goal: Adequate nutrition will be maintained Outcome: Progressing   Problem: Pain Managment: Goal: General experience of comfort will improve Outcome: Progressing   Problem: Safety: Goal: Ability to remain free from injury will improve Outcome: Progressing   Problem: Skin Integrity: Goal: Risk for impaired skin integrity will decrease Outcome: Progressing

## 2022-11-17 NOTE — Op Note (Addendum)
Procedure Date:  11/17/2022  Pre-operative Diagnosis:  Recurrent right inguinal hernia, new left inguinal hernia  Post-operative Diagnosis: Recurrent right inguinal hernia, new left inguinal hernia  Procedure: 1.  Robotic assisted Bilateral Inguinal Hernia Repair 2.  Creation of Bilateral Posterior Rectus-Transversalis Fascia Advancment Flap for Coverage of Pelvic Wound (200 cm)  Surgeon:  Melvyn Neth, MD  Anesthesia:  General endotracheal  Estimated Blood Loss:  30 ml  Specimens:  None  Complications:  None  Findings:  He had a direct right inguinal hernia containing small bowel and an indirect left inguinal hernia containing sigmoid colon.    Indications for Procedure:  This is a 77 y.o. male who presents with bilateral inguinal hernias.  The options of surgery versus observation were reviewed with the patient and/or family. The risks of bleeding, abscess or infection, recurrence of symptoms, potential for an open procedure, injury to surrounding structures, and chronic pain were all discussed with the patient and he was willing to proceed.  We have planned this transabdominal procedure with the creation of bilateral peritoneal flap based on the posterior rectus sheath and transversalis fascia in order to fully cover the mesh, creating a natural tisssue barrier for the bowel and peritoneal cavity.  Description of Procedure: The patient was correctly identified in the preoperative area and brought into the operating room.  The patient was placed supine with VTE prophylaxis in place.  Appropriate time-outs were performed.  Anesthesia was induced and the patient was intubated.  Foley catheter was placed.  Appropriate antibiotics were infused.  The abdomen was prepped and draped in a sterile fashion. A supraumbilical incision was made. A cutdown technique was used to enter the abdominal cavity without injury, and a Hasson trocar was inserted.  Pneumoperitoneum was obtained with  appropriate opening pressures.  A Veress needle was used to start dissecting the peritoneal flap.  Two 8-mm robotic ports were placed in the right and left lateral positions under direct visualization.  A large right and left Bard 3D Max Mesh, a 2-0 Vicryl, and two 2-0 vlock sutures were placed through the umbilical port under direct visualization.  The [employer] platform was docked onto the patient, the camera was inserted and targeted, and the instruments were placed under direct visualization.  Both inguinal regions were inspected for hernias and it was confirmed that the patient had bilateral inguinal hernias.  He had a direct right inguinal hernia and indirect left inguinal hernia.  We started on the right side.  Using electocautery, the peritoneal and posterior rectus tissue flap was created.  The peritoneum on the right side was scored from the median umbilical ligament laterally towards the ASIS.  The flap was mobilized using robotic scissors and the bipolar instruments, creating a plane along the posterior rectus sheath and transversalis fascia down to the pubic tubercle medially. It was then further mobilized laterally across the inguinal canal and femoral vessels and onto the psoas muscle. The inferior epigastric vessels were identified and preserved. This created a posterior rectus and peritoneal flap measuring roughly 17 cm x 12 cm.  The hernia sac and contents were reduced preserving all structures.  A large right Bard 3D Max Mid mesh was placed with good overlap along all the potential hernia defects and secured in place with 2-0 Vicryl along the medial superomedial and superolateral aspects.    We then continued to the left side.  Using electocautery, the peritoneal and posterior rectus tissue flap was created.  The peritoneum on the left  side was scored from the median umbilical ligament laterally towards the ASIS.  The flap was mobilized using robotic scissors and the bipolar instruments,  creating a plane along the posterior rectus sheath and transversalis fascia down to the pubic tubercle medially. It was then further mobilized laterally across the inguinal canal and femoral vessels and onto the psoas muscle. The inferior epigastric vessels were identified and preserved. This created a posterior rectus and peritoneal flap measuring roughly 17 cm x 12 cm.  The hernia sac and contents were reduced preserving all structures.  A large left Bard 3D Max Mid mesh was placed with good overlap along all the potential hernia defects and secured in place with 2-0 Vicryl along the medial superomedial and superolateral aspects.  Then, the peritoneal flaps were advanced over the mesh and carried over to close the defect. A running 2-0 V lock suture was used to approximate the edge of the flap onto the peritoneum on each side.  2-0 Vicryl was used to close two peritoneal tears.  All needles were removed under direct visualization.  The 8- mm ports were removed under direct visualization and the Hasson trocar was removed.  The fascial opening was closed using 0 vicryl suture.  Local anesthetic was infused in all incisions as well as a bilateral ilioinguinal block, and the incisions were closed with 4-0 Monocryl.  The wounds were cleaned and sealed with DermaBond.  Foley catheter was removed and the patient was emerged from anesthesia and extubated and brought to the recovery room for further management.  The patient tolerated the procedure well and all counts were correct at the end of the case.   Melvyn Neth, MD

## 2022-11-17 NOTE — Anesthesia Procedure Notes (Signed)
Procedure Name: Intubation Date/Time: 11/17/2022 2:00 PM  Performed by: Chanetta Marshall, CRNAPre-anesthesia Checklist: Patient identified, Emergency Drugs available, Suction available and Patient being monitored Patient Re-evaluated:Patient Re-evaluated prior to induction Oxygen Delivery Method: Circle system utilized Preoxygenation: Pre-oxygenation with 100% oxygen Induction Type: IV induction Ventilation: Mask ventilation without difficulty Laryngoscope Size: McGraph and 4 Grade View: Grade II Tube type: Oral Tube size: 7.5 mm Number of attempts: 1 Airway Equipment and Method: Oral airway and Video-laryngoscopy Placement Confirmation: ETT inserted through vocal cords under direct vision, positive ETCO2, breath sounds checked- equal and bilateral and CO2 detector Secured at: 21 cm Tube secured with: Tape Dental Injury: Teeth and Oropharynx as per pre-operative assessment

## 2022-11-17 NOTE — Discharge Instructions (Addendum)
Discharge Instructions: 1.  Patient may shower, but do not scrub wounds heavily and dab dry only. 2.  Do not submerge wounds in pool/tub until fully healed. 3.  Do not apply ointments or hydrogen peroxide to the wounds. 4.  May apply ice packs to the wounds for comfort. 5.  It is normal for there to be swelling or bruising in each groin and scrotum areas after this surgery.  Part of it is fluid, and part of it is the gas insufflation.  This will improve on its own. 6.  May wear tighter brief underwear or sports compression underwear to help with the swelling. 7.  May resume your Xarelto on 11/19/22. 8.  Do not drive while taking narcotics for pain control.  Prior to driving, make sure you are able to rotate right and left to look at blindspots without significant pain or discomfort. 9.  No heavy lifting or pushing of more than 10-15 lbs for 6 weeks.   AMBULATORY SURGERY  DISCHARGE INSTRUCTIONS   The drugs that you were given will stay in your system until tomorrow so for the next 24 hours you should not:  Drive an automobile Make any legal decisions Drink any alcoholic beverage   You may resume regular meals tomorrow.  Today it is better to start with liquids and gradually work up to solid foods.  You may eat anything you prefer, but it is better to start with liquids, then soup and crackers, and gradually work up to solid foods.   Please notify your doctor immediately if you have any unusual bleeding, trouble breathing, redness and pain at the surgery site, drainage, fever, or pain not relieved by medication.    Additional Instructions: PLEASE LEAVE GREEN/TEAL BRACELET ON FOR 4 DAYS        Please contact your physician with any problems or Same Day Surgery at [phone], Monday through Friday 6 am to 4 pm, or [place] at [facility] number at [phone].

## 2022-11-18 ENCOUNTER — Encounter: Payer: Self-pay | Admitting: Surgery

## 2022-11-18 DIAGNOSIS — K4021 Bilateral inguinal hernia, without obstruction or gangrene, recurrent: Secondary | ICD-10-CM | POA: Diagnosis not present

## 2022-11-18 LAB — BASIC METABOLIC PANEL
Anion gap: 8 (ref 5–15)
BUN: 34 mg/dL — ABNORMAL HIGH (ref 8–23)
CO2: 23 mmol/L (ref 22–32)
Calcium: 8.4 mg/dL — ABNORMAL LOW (ref 8.9–10.3)
Chloride: 106 mmol/L (ref 98–111)
Creatinine, Ser: 1.58 mg/dL — ABNORMAL HIGH (ref 0.61–1.24)
GFR, Estimated: 45 mL/min — ABNORMAL LOW (ref >60)
Glucose, Bld: 136 mg/dL — ABNORMAL HIGH (ref 70–99)
Potassium: 4.3 mmol/L (ref 3.5–5.1)
Sodium: 137 mmol/L (ref 135–145)

## 2022-11-18 LAB — MAGNESIUM: Magnesium: 2 mg/dL (ref 1.7–2.4)

## 2022-11-18 NOTE — Evaluation (Signed)
Physical Therapy Evaluation Patient Details Name: Nathan Rivera MRN: [id number] DOB: 16-Sep-1946 Today's Date: 11/18/2022  History of Present Illness  S/P B hernia repair  Clinical Impression  Patient received in bed, he wants to go home. Wife at bedside. Patient is independent with bed mobility, transfers and ambulated around nursing station without AD and supervision. He is safe to return home with wife assist. No further PT needs at this time. Signing off.        Recommendations for follow up therapy are one component of a multi-disciplinary discharge planning process, led by the attending physician.  Recommendations may be updated based on patient status, additional functional criteria and insurance authorization.  Follow Up Recommendations No PT follow up      Assistance Recommended at Discharge None  Patient can return home with the following  Assist for transportation;Help with stairs or ramp for entrance    Equipment Recommendations None recommended by PT  Recommendations for Other Services       Functional Status Assessment Patient has had a recent decline in their functional status and demonstrates the ability to make significant improvements in function in a reasonable and predictable amount of time.     Precautions / Restrictions Precautions Precautions: Fall Restrictions Weight Bearing Restrictions: No      Mobility  Bed Mobility Overal bed mobility: Independent                  Transfers Overall transfer level: Independent                      Ambulation/Gait Ambulation/Gait assistance: Supervision Gait Distance (Feet): 150 Feet Assistive device: None Gait Pattern/deviations: Step-through pattern Gait velocity: WFL     General Gait Details: patient without LOB during mobility  Stairs            Wheelchair Mobility    Modified Rankin (Stroke Patients Only)       Balance Overall balance assessment: Independent                                            Pertinent Vitals/Pain Pain Assessment Pain Assessment: No/denies pain    Home Living Family/patient expects to be discharged to:: Private residence Living Arrangements: Spouse/significant other Available Help at Discharge: Family;Available 24 hours/day Type of Home: House Home Access: Stairs to enter Entrance Stairs-Rails: Right Entrance Stairs-Number of Steps: 3   Home Layout: One level Home Equipment: Conservation officer, nature (2 wheels);Cane - single point      Prior Function Prior Level of Function : Independent/Modified Independent;Driving             Mobility Comments: plays golf ADLs Comments: independent     Hand Dominance        Extremity/Trunk Assessment   Upper Extremity Assessment Upper Extremity Assessment: Overall WFL for tasks assessed    Lower Extremity Assessment Lower Extremity Assessment: Overall WFL for tasks assessed    Cervical / Trunk Assessment Cervical / Trunk Assessment: Normal  Communication   Communication: No difficulties  Cognition Arousal/Alertness: Awake/alert Behavior During Therapy: WFL for tasks assessed/performed Overall Cognitive Status: Within Functional Limits for tasks assessed  General Comments      Exercises     Assessment/Plan    PT Assessment Patient does not need any further PT services  PT Problem List         PT Treatment Interventions      PT Goals (Current goals can be found in the Care Plan section)  Acute Rehab PT Goals Patient Stated Goal: to go home PT Goal Formulation: With patient Time For Goal Achievement: 11/19/22 Potential to Achieve Goals: Good    Frequency       Co-evaluation               AM-PAC PT "6 Clicks" Mobility  Outcome Measure Help needed turning from your back to your side while in a flat bed without using bedrails?: None Help needed moving from lying on your  back to sitting on the side of a flat bed without using bedrails?: None Help needed moving to and from a bed to a chair (including a wheelchair)?: None Help needed standing up from a chair using your arms (e.g., wheelchair or bedside chair)?: None Help needed to walk in hospital room?: None Help needed climbing 3-5 steps with a railing? : None 6 Click Score: 24    End of Session Equipment Utilized During Treatment: Gait belt Activity Tolerance: Patient tolerated treatment well Patient left: in bed;with call bell/phone within reach;with family/visitor present Nurse Communication: Mobility status PT Visit Diagnosis: Pain Pain - part of body:  (groin)    Time: 1052-1100 PT Time Calculation (min) (ACUTE ONLY): 8 min   Charges:   PT Evaluation $PT Eval Low Complexity: 1 Low          Arbor Leer, PT, GCS 11/18/22,11:07 AM

## 2022-11-18 NOTE — Discharge Summary (Signed)
[facility] SURGICAL DISCHARGE SUMMARY  Patient ID: Nathan Rivera MRN: [id number] DOB/AGE: 1946-01-08 77 y.o.  Admit date: 11/17/2022 Discharge date: 11/18/2022  Discharge Diagnoses Patient Active Problem List   Diagnosis Date Noted   Recurrent right inguinal hernia 11/17/2022   Left inguinal hernia 11/17/2022   S/P bilateral inguinal hernia repair 11/17/2022   Postoperative nausea and vomiting 11/17/2022    Consultants None  Procedures 11/17/2022:  Robotic assisted laparoscopic bilateral inguinal hernia repair  HPI: ALIK HEINKE is a 77 y.o. male with history of bilateral inguinal hernia who presents for repair on 02/22 with Dr [facility] Course: Informed consent was obtained and documented, and patient underwent uneventful robotic assisted laparoscopic bilateral inguinal hernia repair (Dr Hampton Abbot, 11/17/2022).  Post-operatively, patient did have issues with nausea and weakness and was admitted for observation. Feeling better on POD1. Advancement of patient's diet and ambulation with therapy was well-tolerated. The remainder of patient's hospital course was essentially unremarkable, and discharge planning was initiated accordingly with patient safely able to be discharged home with appropriate discharge instructions, pain control, and outpatient follow-up after all of his questions were answered to his expressed satisfaction.   Discharge Condition: Good    Physical Examination:  Constitutional: Well appearing male; NAD Pulmonary: Normal effort, no respiratory distress Gastrointestinal: Soft, incisional soreness, non-distended, no rebound/guarding Skin: Laparoscopic incisions are CDI with dermabond, no erythema or drainage    Allergies as of 11/18/2022   No Known Allergies      Medication List     TAKE these medications    acetaminophen 325 MG tablet Commonly known as: TYLENOL Take 325 mg by mouth every 6 (six) hours as needed for mild  pain. What changed: Another medication with the same name was added. Make sure you understand how and when to take each.   acetaminophen 500 MG tablet Commonly known as: TYLENOL Take 2 tablets (1,000 mg total) by mouth every 6 (six) hours as needed for mild pain. What changed: You were already taking a medication with the same name, and this prescription was added. Make sure you understand how and when to take each.   amLODipine 5 MG tablet Commonly known as: NORVASC TAKE 1 TABLET BY MOUTH  DAILY   cholecalciferol 1000 units tablet Commonly known as: VITAMIN D Take 1,000 Units by mouth daily.   [employer] Probiotic Acidophilus 10 MG Caps Take by mouth.   donepezil 10 MG tablet Commonly known as: ARICEPT Take 10 mg by mouth at bedtime.   FOLIC ACID PO Take by mouth.   gabapentin 100 MG capsule Commonly known as: NEURONTIN 200 mg 2 (two) times daily. 200 mg in am and 200 mg in PM   ondansetron 4 MG tablet Commonly known as: Zofran Take 1 tablet (4 mg total) by mouth every 8 (eight) hours as needed for nausea or vomiting.   oxyCODONE 5 MG immediate release tablet Commonly known as: Oxy IR/ROXICODONE Take 1 tablet (5 mg total) by mouth every 4 (four) hours as needed for severe pain.   pantoprazole 40 MG tablet Commonly known as: PROTONIX Take 40 mg by mouth at bedtime.   simvastatin 20 MG tablet Commonly known as: ZOCOR Take 20 mg by mouth at bedtime.   Xarelto 20 MG Tabs tablet Generic drug: rivaroxaban daily.               Discharge Care Instructions  (From admission, onward)           Start  Ordered   11/17/22 0000  No dressing needed        11/17/22 1643              Follow-up Information     Tylene Fantasia, PA-C Follow up on 12/01/2022.   Specialty: Physician Assistant Why: Please call tomorrow morning for an appointment in 2 weeks Contact information: [address] [place] [address] [phone]                   Time spent on discharge management including discussion of hospital course, clinical condition, outpatient instructions, prescriptions, and follow up with the patient and members of the medical team: >30 minutes  -- Edison Simon , PA-C [facility]  11/18/2022, 10:49 AM [phone] M-F: 7am - 4pm

## 2022-11-18 NOTE — TOC Initial Note (Signed)
Transition of Care [facility]) - Initial/Assessment Note    Patient Details  Name: Nathan Rivera MRN: [id number] Date of Birth: 29-Aug-1946  Transition of Care [facility]) CM/SW Contact:    Conception Oms, RN Phone Number: 11/18/2022, 9:11 AM  Clinical Narrative:                  Transition of Care [facility]) Screening Note   Patient Details  Name: Nathan Rivera Date of Birth: [birth year]   Transition of Care [facility]) CM/SW Contact:    Conception Oms, RN Phone Number: 11/18/2022, 9:11 AM    Transition of Care Department [facility]) has reviewed patient and no TOC needs have been identified at this time. We will continue to monitor patient advancement through interdisciplinary progression rounds. If new patient transition needs arise, please place a TOC consult.    Expected Discharge Plan: Home/Self Care Barriers to Discharge: No Barriers Identified   Patient Goals and CMS Choice            Expected Discharge Plan and Services       Living arrangements for the past 2 months: Single Family Home Expected Discharge Date: 11/17/22                                    Prior Living Arrangements/Services Living arrangements for the past 2 months: Single Family Home Lives with:: Spouse                   Activities of Daily Living Home Assistive Devices/Equipment: Cane (specify quad or straight) ADL Screening (condition at time of admission) Patient's cognitive ability adequate to safely complete daily activities?: Yes Is the patient deaf or have difficulty hearing?: No Does the patient have difficulty seeing, even when wearing glasses/contacts?: No Does the patient have difficulty concentrating, remembering, or making decisions?: Yes Patient able to express need for assistance with ADLs?: Yes Does the patient have difficulty dressing or bathing?: No Independently performs ADLs?: Yes (appropriate for developmental age) Does the patient have difficulty walking or climbing  stairs?: No Weakness of Legs: None Weakness of Arms/Hands: None  Permission Sought/Granted                  Emotional Assessment              Admission diagnosis:  S/P bilateral inguinal hernia repair [id number], Z87.19] Postoperative nausea and vomiting [R11.2, Z98.890] Patient Active Problem List   Diagnosis Date Noted   Recurrent right inguinal hernia 11/17/2022   Left inguinal hernia 11/17/2022   S/P bilateral inguinal hernia repair 11/17/2022   Postoperative nausea and vomiting 11/17/2022   Osteoarthrosis involving lower leg 09/11/2019   Pure hypercholesterolemia 09/11/2019   Raynaud disease 12/19/2018   Ulcer of finger ([place]) 12/17/2018   AAA (abdominal aortic aneurysm) without rupture ([place]) 04/03/2017   Ventral incisional hernia 10/24/2014   Duodenal ulcer 08/18/2014   Impaired glucose tolerance 08/18/2014   Epididymitis 03/10/2014   Pyelonephritis 02/24/2014   Perforated gastric ulcer ([place]) 09/20/2013   Renal abscess 08/20/2013   Acute respiratory failure with hypoxia ([place]) 08/20/2013   Dehydration 08/20/2013   Bacteremia due to ESBL Escherichia coli 08/20/2013   Severe sepsis ([place]) 08/18/2013   Acute renal failure ([place]) 08/18/2013   Hypotension, unspecified 08/18/2013   Hydronephrosis, left 08/18/2013   Nephrolithiasis 08/18/2013   Hematuria 08/08/2013   History of bladder cancer 11/20/2012   Hypertrophy of prostate  with urinary obstruction and other lower urinary tract symptoms (LUTS) 11/20/2012   Chronic kidney disease, stage III (moderate) ([place]) 11/05/2012   Increased frequency of urination 11/05/2012   Urinary urgency 11/05/2012   PAD (peripheral artery disease) ([place]) 08/29/2011   Hypertension 08/29/2011   Abnormal electrocardiogram 08/29/2011   Preoperative cardiovascular examination 08/29/2011   Hyperlipidemia 08/29/2011   Malaise 08/29/2011   PCP:  Sofie Hartigan, MD Pharmacy:   [place], [place] to Registered [facility] [address] [place] [address] Phone: [phone] Fax: [phone]  [id number] [place]Phone: [phone]: [phone] [facility] [address] [address]Phone: [phone]: [phone]    Social Determinants of Health (SDOH) Social History: [place] No Food Insecurity (11/17/2022)  Housing: Low Risk  (11/17/2022)  Transportation Needs: No Transportation Needs (11/17/2022)  Utilities: Not At Risk (11/17/2022)  Tobacco Use: High Risk (11/17/2022)   SDOH Interventions:     Readmission Risk Interventions     No data to display

## 2022-11-18 NOTE — Progress Notes (Signed)
Discharge instructions reviewed with patient including followup visits and new medications.  Understanding was verbalized and all questions were answered.  IV removed without complication; patient tolerated well.  Patient discharged home via wheelchair in stable condition escorted by volunteer staff.

## 2022-12-01 ENCOUNTER — Ambulatory Visit (INDEPENDENT_AMBULATORY_CARE_PROVIDER_SITE_OTHER): Payer: Medicare Other | Admitting: Physician Assistant

## 2022-12-01 ENCOUNTER — Encounter: Payer: Self-pay | Admitting: Physician Assistant

## 2022-12-01 VITALS — BP 149/87 | HR 66 | Temp 98.0°F | Ht 71.0 in | Wt 199.0 lb

## 2022-12-01 DIAGNOSIS — Z09 Encounter for follow-up examination after completed treatment for conditions other than malignant neoplasm: Secondary | ICD-10-CM

## 2022-12-01 DIAGNOSIS — K4020 Bilateral inguinal hernia, without obstruction or gangrene, not specified as recurrent: Secondary | ICD-10-CM

## 2022-12-01 NOTE — Patient Instructions (Signed)

## 2022-12-01 NOTE — Progress Notes (Signed)
[facility] POST-OP OFFICE VISIT  12/01/2022  HPI: Nathan Rivera is a 77 y.o. male 14 days s/p robotic assisted laparoscopic bilateral inguinal hernia repair with Dr Hampton Abbot   Overall doing well; did stay one night post-op do to troubles with anesthesia  Notes he has done well at home since Did have significant swelling in scrotum but this is now resolved Also notes some more fatigue from baseline; taking daily naps around 3pm Otherwise, no fever, chills, nausea, emesis, or bowel changes No other complaints  Vital signs: BP (!) 149/87   Pulse 66   Temp 98 F (36.7 C)   Ht '5\' 11"'$  (1.803 m)   Wt 199 lb (90.3 kg)   SpO2 96%   BMI 27.75 kg/m    Physical Exam: Constitutional: Well appearing male, NAD Abdomen: Soft, non-tender, non-distended, no rebound/guarding Skin: Laparoscopic incisions are healing well, no erythema or drainage  GU: No scrotal swelling nor ecchymosis  Assessment/Plan: This is a 77 y.o. male 14 days s/p robotic assisted laparoscopic bilateral inguinal hernia repair with Dr Hampton Abbot    - Pain control prn  - Reviewed wound care recommendation  - Reviewed lifting restrictions; 6 weeks total  - He can follow up on as needed basis; He understands to call with questions/concerns  -- Edison Simon, PA-C  [facility] 12/01/2022, 2:53 PM M-F: 7am - 4pm

## 2023-07-19 NOTE — Progress Notes (Signed)
 Nathan Rivera is a 77 y.o. male that comes today for the following problem(s):   Chief Complaint  Patient presents with  . Follow-up    AWV    HPI: Patient in the office for follow-up of chronic medical conditions including hyperlipidemia, impaired glucose tolerance, stage III chronic kidney disease, duodenal ulcer, peripheral vascular disease, AAA, chronic low back pain with right-sided sciatica, thrombocytopenia, Raynaud's disease, B12 deficiency, history of bladder cancer and tobacco use.  He is also in the office for his Medicare annual wellness visit.  He is trying to eat a healthy diet and be active.  He continues to take his simvastatin  regularly for his cholesterol.  He denies chest pain, shortness of breath or palpitations.  He denies polyuria, polyphagia or polydipsia.  He continues to stay well-hydrated to control his stage III chronic kidney disease.  He remains on his pantoprazole  for the protection related to his duodenal ulcer.  He denies epigastric pain, melena or hematochezia.  He continues to follow with vascular surgery regularly for peripheral vascular disease and his infrarenal AAA.  They are monitoring this every other year.  He states his low back pain has been stable with the Lyrica though he will still note intermittent radiation to his right lower extremity.  He denies unusual bruising or bleeding related to his thrombocytopenia.  He continues to take his amlodipine  for his Raynaud's disease.  He is also using precautions when exposed to cold conditions.  He continues to take his B12 supplement regularly for his B12 disease and notes improved energy.  He denies hematuria related to his history of bladder cancer.  He is no longer being followed by urology.  He continues to smoke and does not desire to quit at this time.  He denies side effect problems with his medications.  MEDICARE WELLNESS VISIT  Providers Rendering Care 1. Dr. Cheryl Jericho (PCP)  2. Dr. Arthea Farrow  (Neurology) 3. Dr. Cordella Shawl (Vascular Surgery) 4. Dr. Aloysius Plant (General Surgery)  Functional Assessment (1) Hearing: Demonstrates no difficulty in hearing during normal conversation with hearing aids (2) Risk of Falls: Patient denies any falls or near falls in the last year, Gait steady without assistance during walk from waiting area to exam room (3) Home Safety: Patient feels secure in their home, There are operational smoke alarms in multiple areas of the home (4) Activities of Daily Living: Independently manages personal grooming and household chores, including cooking, cleaning and laundry. Manages Personal finances without assistance.  Depression Screening Denies loss of interest in normal activities, has no episodes of weeping or anxiety and reports no changes in appetite or sleep.  Cognitive Impairment Patient notes episodes of loosing things, being forgetful. Seems oriented to person, place and time. Responses appear appropriate and timely to this observer.  Following regularly with neurology  PREVENTION PLAN  Cardiovascular: Last LDL - 89 Diabetes: Last A1c - 5.4 Glaucoma: N/A Hepatitis B (HBV) Vaccine:  Not Applicable Smoking Cessation:  Not Applicable  Other Personalized Health Advice  Encouraged patient to exercise 5 days a week, walking, water aerobics, gentle stretching recommended. Increase dietary intake of fresh fruits and vegetables, reduce red meat to twice a week.  End of Life Counseling Patient has no living will in place or POA; Full Code    Goals     . Maintain health/healthy lifestyle        Patient Active Problem List  Diagnosis  . Pure hypercholesterolemia  . Unspecified essential hypertension  . Osteoarthrosis, unspecified  whether generalized or localized, lower leg  . Chronic kidney disease, stage III (moderate) (CMS/HHS-HCC)  . Hypertrophy of prostate with urinary obstruction and other lower urinary tract symptoms (LUTS)  .  Personal history of malignant neoplasm of bladder  . Peripheral vascular disease (CMS-HCC)  . Chronic or unspecified gastric ulcer with perforation (CMS/HHS-HCC)  . Calculus of kidney  . Essential hypertension  . Benign prostatic hypertrophy with lower urinary tract symptoms (LUTS)  . Abnormal electrocardiogram  . Duodenal ulcer  . Impaired glucose tolerance  . Incisional hernia     Past Medical History:  Diagnosis Date  . Chronic kidney disease   . Duodenal ulcer   . Elevated fasting blood sugar   . Obesity   . Osteoarthrosis, unspecified whether generalized or localized, lower leg    knees  . Pure hypercholesterolemia   . Seasonal allergic rhinitis   . Unspecified essential hypertension   . Vitamin D deficiency      Past Surgical History:  Procedure Laterality Date  . TRIPLE A REPAIR  2012  . HERNIA REPAIR    . NASAL CAUTERY    . TONSILLECTOMY      Social History   Socioeconomic History  . Marital status: Married  Tobacco Use  . Smoking status: Every Day    Current packs/day: 0.75    Average packs/day: 0.8 packs/day for 40.0 years (30.0 ttl pk-yrs)    Types: Cigarettes  . Smokeless tobacco: Never  Vaping Use  . Vaping status: Never Used  Substance and Sexual Activity  . Alcohol use: Yes    Alcohol/week: 3.3 standard drinks of alcohol    Types: 4 Standard drinks or equivalent per week    Comment: Rare.  . Drug use: No  . Sexual activity: Defer   Social Drivers of Health   Financial Resource Strain: Low Risk  (07/19/2023)   Overall Financial Resource Strain (CARDIA)   . Difficulty of Paying Living Expenses: Not hard at all  Food Insecurity: No Food Insecurity (07/19/2023)   Hunger Vital Sign   . Worried About Programme researcher, broadcasting/film/video in the Last Year: Never true   . Ran Out of Food in the Last Year: Never true  Transportation Needs: No Transportation Needs (07/19/2023)   PRAPARE - Transportation   . Lack of Transportation (Medical): No   . Lack of  Transportation (Non-Medical): No  Housing Stability: Unknown (07/19/2023)   Housing Stability Vital Sign   . Unable to Pay for Housing in the Last Year: No   . Homeless in the Last Year: No    Family History  Problem Relation Name Age of Onset  . High blood pressure (Hypertension) Sister    . Stroke Sister       No Known Allergies  Prior to Admission medications   Medication Sig Taking? Last Dose  ascorbic acid, vitamin C, (VITAMIN C) 1000 MG tablet Take 1,000 mg by mouth once daily. Yes Taking  cholecalciferol (VITAMIN D3) 1000 unit tablet Take 1,000 Units by mouth once daily Yes Taking  cyanocobalamin, vitamin B-12, (VITAMIN B-12 ORAL) Take by mouth Yes Taking  FOLIC ACID ORAL Take 1 tablet by mouth once daily Yes Taking  Lactobacillus acidophilus Cap Take 1 capsule by mouth once daily Yes Taking  pantoprazole  (PROTONIX ) 40 MG DR tablet TAKE 1 TABLET BY MOUTH IN THE  MORNING Yes Taking  simvastatin  (ZOCOR ) 20 MG tablet take 1 tablet by mouth at night Yes Taking  XARELTO  20 mg tablet TAKE 1 TABLET BY  MOUTH DAILY  WITH DINNER Yes Taking  amLODIPine  (NORVASC ) 5 MG tablet TAKE 1 TABLET BY MOUTH ONCE  DAILY    donepeziL  (ARICEPT ) 10 MG tablet TAKE 1 TABLET BY MOUTH AT  BEDTIME    memantine (NAMENDA) 10 MG tablet TAKE  1 TABLET TWICE DAILY FOR  MEMORY LOSS    pregabalin (LYRICA) 100 MG capsule Take 50mg  in the morning and 100 mg at night for 1 week, then take 100 mg twice daily       Objective:  BP 130/80 (BP Location: Left upper arm, Patient Position: Sitting, BP Cuff Size: Adult)   Pulse 57   Ht 180.3 cm (5' 11)   Wt 89.4 kg (197 lb)   SpO2 97%   BMI 27.48 kg/m   Physical Examination:  GENERAL:  The patient is alert, oriented and in no acute distress.  HEENT:  Head is normocephalic/atraumatic.  Pupils equal, round and reactive to light and accommodation.  Extraocular movements intact.  Nose and throat are clear. NECK/LYMPHATICS:  Supple without thyromegaly or  lymphadenopathy.   RESPIRATORY:  Chest wall is within normal limits.   Clear to auscultation and percussion.   CARDIAC:  Regular rate and rhythm, normal S1 and S2 without murmurs, rubs or gallops.   VASCULAR:  Distal pulses 2+.   GASTROINTESTINAL  Soft. No organomegaly or tenderness found.    MUSCULOSKELETAL:  No cyanosis, clubbing or edema noted.   NEUROLOGIC:  The patient is alert and oriented x 3.  No focal deficits.       A/P   Diagnoses and all orders for this visit:  Pure hypercholesterolemia - stable -     Comprehensive Metabolic Panel (CMP) -     Lipid Panel w/calc LDL  Impaired glucose tolerance - stable -     Hemoglobin A1C  Stage 3b chronic kidney disease (CMS/HHS-HCC) - stable  Duodenal ulcer - stable  Peripheral vascular disease (CMS-HCC) - stable  Infrarenal abdominal aortic aneurysm (AAA) without rupture (CMS-HCC) - stable  Chronic right-sided low back pain with right-sided sciatica - stable  Thrombocytopenia (CMS-HCC) - stable -     CBC w/auto Differential (3 Part)  Raynaud's disease with gangrene (CMS/HHS-HCC) - stable  B12 deficiency - stable -     Vitamin B12  Personal history of malignant neoplasm of bladder - stable  Tobacco use disorder - stable  Medicare annual wellness visit, subsequent  Continue current medications - patient instructed to have the pharmacy contact our office between visits for refills Counseled regarding diet and exercise - encourage to make healthy food choices and sensible portion sizes.  Encouraged regular aerobic activity 3-4 days per week. Reviewed most recent labs with patient.  Await above ordered labs Continue to follow with specialist, most recent neurology, general surgery and vascular surgery notes reviewed Counseled regarding smoking cessation strategies.  Patient declines at this time Immunizations: Recommend Prevnar 20, Shingrix and tetanus at pharmacy.  Recommend flu shot yearly in the fall.  Consider COVID  booster.  Recommend RSV vaccine  Return in about 6 months (around 01/17/2024) for Regular follow up, Fasting.      Immunizations: Pneumonia: Recommend Prevnar 20 at pharmacy Shingles: Recommend Shingrix at pharmacy Tetanus: Recommend tetanus at pharmacy Flu shot recommended yearly in the fall Consider COVID booster Recommend RSV vaccine   High Cholesterol: Care Instructions Overview   Cholesterol is a type of fat in your blood. It is needed for many body functions, such as making new cells. Cholesterol is made by  your body. It also comes from food you eat. High cholesterol means that you have too much of the fat in your blood. This raises your risk of a heart attack and stroke. LDL and HDL are part of your total cholesterol. LDL is the bad cholesterol. High LDL can raise your risk for coronary artery disease, heart attack, and stroke. HDL is the good cholesterol. It helps clear bad cholesterol from the body. High HDL is linked with a lower risk of coronary artery disease, heart attack, and stroke. Your cholesterol levels help your doctor find out your risk for having a heart attack or stroke. You and your doctor can talk about whether you need to lower your risk and what treatment is best for you. Treatment options include a heart-healthy lifestyle and medicine. Both options can help lower your cholesterol and your risk. The way you choose to lower your risk will depend on how high your risk is for heart attack and stroke. It will also depend on how you feel about taking medicines. Follow-up care is a key part of your treatment and safety. Be sure to make and go to all appointments, and call your doctor if you are having problems. It's also a good idea to know your test results and keep a list of the medicines you take. How can you care for yourself at home? Eat heart-healthy foods. Eat fruits, vegetables, whole grains, beans, and other high-fiber foods. Eat lean proteins, such as  seafood, lean meats, beans, nuts, and soy products. Eat healthy fats, such as canola and olive oil. Choose foods that are low in saturated fat. Limit sodium and alcohol. Limit drinks and foods with added sugar. Be physically active. Try to do moderate activity at least 2 hours a week. Or try to do vigorous activity at least 1 hours a week. You may want to walk or try other activities, such as running, swimming, cycling, or playing tennis or team sports. Stay at a healthy weight or lose weight by making the changes in eating and physical activity listed above. Losing just a small amount of weight, even 5 to 10 pounds, can help reduce your risk for having a heart attack or stroke. Do not smoke. Manage other health problems. These include diabetes and high blood pressure. If you think you may have a problem with alcohol or drug use, talk to your doctor. If you take medicine, take it exactly as prescribed. Call your doctor if you think you are having a problem with your medicine. Check with your doctor or pharmacist before you use any other medicines, including over-the-counter medicines. Make sure your doctor knows all of the medicines, vitamins, herbal products, and supplements you take. Taking some medicines together can cause problems. When should you call for help? Watch closely for changes in your health, and be sure to contact your doctor if:   You need help making lifestyle changes.    You have questions about your medicine.  Where can you learn more? Log in to your [facility] MyChart account at [email or web address] and click on top menu option Health then select Search Medical Library. Enter [phone] in the search box and click the magnify glass to learn more about High Cholesterol: Care Instructions. Current as of: March 19, 2022 Content Version: 14.1  2006-2024 [employer].  Care instructions adapted under license by your healthcare professional. If you have questions  about a medical condition or this instruction, always ask your healthcare professional. [employer] disclaims any warranty or  liability for your use of this information.

## 2024-01-23 ENCOUNTER — Other Ambulatory Visit: Payer: Self-pay | Admitting: Physician Assistant

## 2024-01-23 DIAGNOSIS — R413 Other amnesia: Secondary | ICD-10-CM

## 2024-01-23 DIAGNOSIS — R2689 Other abnormalities of gait and mobility: Secondary | ICD-10-CM

## 2024-01-29 ENCOUNTER — Ambulatory Visit
Admission: RE | Admit: 2024-01-29 | Discharge: 2024-01-29 | Disposition: A | Discharge status: Home / Self Care | Source: Ambulatory Visit | Attending: Physician Assistant | Admitting: Physician Assistant

## 2024-01-29 DIAGNOSIS — R413 Other amnesia: Secondary | ICD-10-CM | POA: Insufficient documentation

## 2024-01-29 DIAGNOSIS — R2689 Other abnormalities of gait and mobility: Secondary | ICD-10-CM | POA: Insufficient documentation

## 2024-02-13 ENCOUNTER — Encounter (INDEPENDENT_AMBULATORY_CARE_PROVIDER_SITE_OTHER): Payer: Self-pay

## 2024-04-15 ENCOUNTER — Inpatient Hospital Stay
Admission: EM | Admit: 2024-04-15 | Discharge: 2024-04-30 | DRG: 270 | Disposition: A | Discharge status: Home Health | Attending: Student | Admitting: Student

## 2024-04-15 ENCOUNTER — Other Ambulatory Visit: Payer: Self-pay

## 2024-04-15 ENCOUNTER — Emergency Department

## 2024-04-15 DIAGNOSIS — I7102 Dissection of abdominal aorta: Secondary | ICD-10-CM | POA: Diagnosis present

## 2024-04-15 DIAGNOSIS — D62 Acute posthemorrhagic anemia: Secondary | ICD-10-CM | POA: Diagnosis not present

## 2024-04-15 DIAGNOSIS — I161 Hypertensive emergency: Secondary | ICD-10-CM | POA: Diagnosis present

## 2024-04-15 DIAGNOSIS — Z8551 Personal history of malignant neoplasm of bladder: Secondary | ICD-10-CM | POA: Diagnosis not present

## 2024-04-15 DIAGNOSIS — N1411 Contrast-induced nephropathy: Secondary | ICD-10-CM | POA: Diagnosis not present

## 2024-04-15 DIAGNOSIS — K920 Hematemesis: Secondary | ICD-10-CM | POA: Diagnosis not present

## 2024-04-15 DIAGNOSIS — Y712 Prosthetic and other implants, materials and accessory cardiovascular devices associated with adverse incidents: Secondary | ICD-10-CM | POA: Diagnosis present

## 2024-04-15 DIAGNOSIS — I10 Essential (primary) hypertension: Secondary | ICD-10-CM | POA: Diagnosis present

## 2024-04-15 DIAGNOSIS — I150 Renovascular hypertension: Secondary | ICD-10-CM | POA: Diagnosis present

## 2024-04-15 DIAGNOSIS — Z7982 Long term (current) use of aspirin: Secondary | ICD-10-CM

## 2024-04-15 DIAGNOSIS — I723 Aneurysm of iliac artery: Secondary | ICD-10-CM | POA: Diagnosis present

## 2024-04-15 DIAGNOSIS — N178 Other acute kidney failure: Secondary | ICD-10-CM | POA: Diagnosis present

## 2024-04-15 DIAGNOSIS — R1013 Epigastric pain: Secondary | ICD-10-CM

## 2024-04-15 DIAGNOSIS — Z823 Family history of stroke: Secondary | ICD-10-CM

## 2024-04-15 DIAGNOSIS — Z8679 Personal history of other diseases of the circulatory system: Secondary | ICD-10-CM | POA: Diagnosis not present

## 2024-04-15 DIAGNOSIS — R109 Unspecified abdominal pain: Secondary | ICD-10-CM | POA: Diagnosis present

## 2024-04-15 DIAGNOSIS — I708 Atherosclerosis of other arteries: Secondary | ICD-10-CM | POA: Diagnosis present

## 2024-04-15 DIAGNOSIS — Z79899 Other long term (current) drug therapy: Secondary | ICD-10-CM

## 2024-04-15 DIAGNOSIS — K317 Polyp of stomach and duodenum: Secondary | ICD-10-CM | POA: Diagnosis present

## 2024-04-15 DIAGNOSIS — D696 Thrombocytopenia, unspecified: Secondary | ICD-10-CM | POA: Diagnosis present

## 2024-04-15 DIAGNOSIS — R042 Hemoptysis: Principal | ICD-10-CM | POA: Diagnosis present

## 2024-04-15 DIAGNOSIS — I7300 Raynaud's syndrome without gangrene: Secondary | ICD-10-CM | POA: Diagnosis present

## 2024-04-15 DIAGNOSIS — M549 Dorsalgia, unspecified: Secondary | ICD-10-CM | POA: Diagnosis present

## 2024-04-15 DIAGNOSIS — T82310A Breakdown (mechanical) of aortic (bifurcation) graft (replacement), initial encounter: Secondary | ICD-10-CM | POA: Diagnosis present

## 2024-04-15 DIAGNOSIS — J189 Pneumonia, unspecified organism: Secondary | ICD-10-CM | POA: Diagnosis present

## 2024-04-15 DIAGNOSIS — K219 Gastro-esophageal reflux disease without esophagitis: Secondary | ICD-10-CM | POA: Diagnosis present

## 2024-04-15 DIAGNOSIS — Z888 Allergy status to other drugs, medicaments and biological substances status: Secondary | ICD-10-CM

## 2024-04-15 DIAGNOSIS — Z95828 Presence of other vascular implants and grafts: Secondary | ICD-10-CM | POA: Diagnosis not present

## 2024-04-15 DIAGNOSIS — F0280 Dementia in other diseases classified elsewhere without behavioral disturbance: Secondary | ICD-10-CM | POA: Diagnosis present

## 2024-04-15 DIAGNOSIS — R001 Bradycardia, unspecified: Secondary | ICD-10-CM | POA: Diagnosis not present

## 2024-04-15 DIAGNOSIS — G309 Alzheimer's disease, unspecified: Secondary | ICD-10-CM | POA: Diagnosis present

## 2024-04-15 DIAGNOSIS — I714 Abdominal aortic aneurysm, without rupture, unspecified: Secondary | ICD-10-CM | POA: Diagnosis present

## 2024-04-15 DIAGNOSIS — N189 Chronic kidney disease, unspecified: Secondary | ICD-10-CM | POA: Diagnosis not present

## 2024-04-15 DIAGNOSIS — I701 Atherosclerosis of renal artery: Secondary | ICD-10-CM | POA: Diagnosis present

## 2024-04-15 DIAGNOSIS — I169 Hypertensive crisis, unspecified: Secondary | ICD-10-CM | POA: Diagnosis present

## 2024-04-15 DIAGNOSIS — K2289 Other specified disease of esophagus: Secondary | ICD-10-CM | POA: Diagnosis present

## 2024-04-15 DIAGNOSIS — E7800 Pure hypercholesterolemia, unspecified: Secondary | ICD-10-CM | POA: Diagnosis present

## 2024-04-15 DIAGNOSIS — I771 Stricture of artery: Secondary | ICD-10-CM | POA: Diagnosis present

## 2024-04-15 DIAGNOSIS — Z7901 Long term (current) use of anticoagulants: Secondary | ICD-10-CM | POA: Diagnosis not present

## 2024-04-15 DIAGNOSIS — N179 Acute kidney failure, unspecified: Secondary | ICD-10-CM | POA: Diagnosis not present

## 2024-04-15 DIAGNOSIS — N183 Chronic kidney disease, stage 3 unspecified: Secondary | ICD-10-CM | POA: Diagnosis not present

## 2024-04-15 DIAGNOSIS — E538 Deficiency of other specified B group vitamins: Secondary | ICD-10-CM | POA: Diagnosis present

## 2024-04-15 DIAGNOSIS — N19 Unspecified kidney failure: Secondary | ICD-10-CM | POA: Diagnosis not present

## 2024-04-15 DIAGNOSIS — T508X5A Adverse effect of diagnostic agents, initial encounter: Secondary | ICD-10-CM | POA: Diagnosis not present

## 2024-04-15 DIAGNOSIS — Z7902 Long term (current) use of antithrombotics/antiplatelets: Secondary | ICD-10-CM

## 2024-04-15 DIAGNOSIS — N184 Chronic kidney disease, stage 4 (severe): Secondary | ICD-10-CM | POA: Diagnosis present

## 2024-04-15 DIAGNOSIS — Z9889 Other specified postprocedural states: Secondary | ICD-10-CM

## 2024-04-15 DIAGNOSIS — D631 Anemia in chronic kidney disease: Secondary | ICD-10-CM | POA: Diagnosis present

## 2024-04-15 DIAGNOSIS — F17210 Nicotine dependence, cigarettes, uncomplicated: Secondary | ICD-10-CM | POA: Diagnosis present

## 2024-04-15 DIAGNOSIS — M5441 Lumbago with sciatica, right side: Secondary | ICD-10-CM | POA: Diagnosis present

## 2024-04-15 DIAGNOSIS — Z87442 Personal history of urinary calculi: Secondary | ICD-10-CM

## 2024-04-15 DIAGNOSIS — Z8673 Personal history of transient ischemic attack (TIA), and cerebral infarction without residual deficits: Secondary | ICD-10-CM

## 2024-04-15 DIAGNOSIS — Z8711 Personal history of peptic ulcer disease: Secondary | ICD-10-CM

## 2024-04-15 DIAGNOSIS — G8929 Other chronic pain: Secondary | ICD-10-CM | POA: Diagnosis present

## 2024-04-15 DIAGNOSIS — T82330A Leakage of aortic (bifurcation) graft (replacement), initial encounter: Secondary | ICD-10-CM | POA: Diagnosis not present

## 2024-04-15 DIAGNOSIS — R7302 Impaired glucose tolerance (oral): Secondary | ICD-10-CM | POA: Diagnosis present

## 2024-04-15 DIAGNOSIS — N289 Disorder of kidney and ureter, unspecified: Secondary | ICD-10-CM

## 2024-04-15 HISTORY — DX: Alzheimer's disease, unspecified: G30.9

## 2024-04-15 HISTORY — DX: Dementia in other diseases classified elsewhere, unspecified severity, without behavioral disturbance, psychotic disturbance, mood disturbance, and anxiety: F02.80

## 2024-04-15 LAB — CBC
HCT: 47.3 % (ref 39.0–52.0)
Hemoglobin: 15.3 g/dL (ref 13.0–17.0)
MCH: 29.7 pg (ref 26.0–34.0)
MCHC: 32.3 g/dL (ref 30.0–36.0)
MCV: 91.7 fL (ref 80.0–100.0)
Platelets: 160 K/uL (ref 150–400)
RBC: 5.16 MIL/uL (ref 4.22–5.81)
RDW: 15.1 % (ref 11.5–15.5)
WBC: 9.1 K/uL (ref 4.0–10.5)
nRBC: 0 % (ref 0.0–0.2)

## 2024-04-15 LAB — BASIC METABOLIC PANEL WITH GFR
Anion gap: 11 (ref 5–15)
BUN: 39 mg/dL — ABNORMAL HIGH (ref 8–23)
CO2: 24 mmol/L (ref 22–32)
Calcium: 9.4 mg/dL (ref 8.9–10.3)
Chloride: 105 mmol/L (ref 98–111)
Creatinine, Ser: 2.43 mg/dL — ABNORMAL HIGH (ref 0.61–1.24)
GFR, Estimated: 27 mL/min — ABNORMAL LOW (ref >60)
Glucose, Bld: 94 mg/dL (ref 70–99)
Potassium: 4.2 mmol/L (ref 3.5–5.1)
Sodium: 140 mmol/L (ref 135–145)

## 2024-04-15 LAB — TROPONIN I (HIGH SENSITIVITY)
Troponin I (High Sensitivity): 13 ng/L (ref <18)
Troponin I (High Sensitivity): 12 ng/L (ref <18)

## 2024-04-15 LAB — PROTIME-INR
INR: 1.3 — ABNORMAL HIGH (ref 0.8–1.2)
Prothrombin Time: 16.9 s — ABNORMAL HIGH (ref 11.4–15.2)

## 2024-04-15 LAB — MRSA NEXT GEN BY PCR, NASAL: MRSA by PCR Next Gen: NOT DETECTED

## 2024-04-15 LAB — PROCALCITONIN: Procalcitonin: <0.1 ng/mL

## 2024-04-15 LAB — BRAIN NATRIURETIC PEPTIDE: B Natriuretic Peptide: 394.5 pg/mL — ABNORMAL HIGH (ref 0.0–100.0)

## 2024-04-15 LAB — LACTIC ACID, PLASMA
Lactic Acid, Venous: 0.9 mmol/L (ref 0.5–1.9)
Lactic Acid, Venous: 1.2 mmol/L (ref 0.5–1.9)

## 2024-04-15 LAB — GLUCOSE, CAPILLARY: Glucose-Capillary: 100 mg/dL — ABNORMAL HIGH (ref 70–99)

## 2024-04-15 LAB — APTT: aPTT: <22 s — ABNORMAL LOW (ref 24–36)

## 2024-04-15 MED ORDER — PANTOPRAZOLE SODIUM 40 MG PO TBEC
40.0000 mg | DELAYED_RELEASE_TABLET | Freq: Two times a day (BID) | ORAL | Status: DC
  Administered 2024-04-15: 40 mg via ORAL
  Filled 2024-04-15: qty 1

## 2024-04-15 MED ORDER — LABETALOL HCL 5 MG/ML IV SOLN
5.0000 mg | Freq: Once | INTRAVENOUS | Status: AC
  Administered 2024-04-15: 5 mg via INTRAVENOUS
  Filled 2024-04-15: qty 4

## 2024-04-15 MED ORDER — CLEVIDIPINE BUTYRATE 0.5 MG/ML IV EMUL
0.0000 mg/h | INTRAVENOUS | Status: DC
  Administered 2024-04-15: 14 mg/h via INTRAVENOUS
  Administered 2024-04-15: 2 mg/h via INTRAVENOUS
  Administered 2024-04-15 – 2024-04-16 (×3): 14 mg/h via INTRAVENOUS
  Filled 2024-04-15 (×4): qty 50

## 2024-04-15 MED ORDER — LABETALOL HCL 5 MG/ML IV SOLN
5.0000 mg | Freq: Once | INTRAVENOUS | Status: DC
  Filled 2024-04-15: qty 4

## 2024-04-15 MED ORDER — SODIUM CHLORIDE 0.9 % IV SOLN
2.0000 g | Freq: Once | INTRAVENOUS | Status: AC
  Administered 2024-04-15: 2 g via INTRAVENOUS
  Filled 2024-04-15 (×2): qty 20

## 2024-04-15 MED ORDER — IOHEXOL 350 MG/ML SOLN
80.0000 mL | Freq: Once | INTRAVENOUS | Status: AC | PRN
  Administered 2024-04-15: 80 mL via INTRAVENOUS

## 2024-04-15 MED ORDER — POLYETHYLENE GLYCOL 3350 17 G PO PACK
17.0000 g | PACK | Freq: Every day | ORAL | Status: AC | PRN
Start: 2024-04-15 — End: ?
  Administered 2024-04-18: 17 g via ORAL
  Filled 2024-04-15: qty 1

## 2024-04-15 MED ORDER — PANTOPRAZOLE SODIUM 40 MG IV SOLR
40.0000 mg | Freq: Once | INTRAVENOUS | Status: AC
  Administered 2024-04-15: 40 mg via INTRAVENOUS
  Filled 2024-04-15: qty 10

## 2024-04-15 MED ORDER — DOCUSATE SODIUM 100 MG PO CAPS
100.0000 mg | ORAL_CAPSULE | Freq: Two times a day (BID) | ORAL | Status: DC | PRN
  Administered 2024-04-18 – 2024-04-30 (×4): 100 mg via ORAL
  Filled 2024-04-15 (×4): qty 1

## 2024-04-15 MED ORDER — ESMOLOL HCL-SODIUM CHLORIDE 2000 MG/100ML IV SOLN
25.0000 ug/kg/min | INTRAVENOUS | Status: DC
  Administered 2024-04-15 – 2024-04-18 (×8): 25 ug/kg/min via INTRAVENOUS
  Administered 2024-04-19 (×3): 75 ug/kg/min via INTRAVENOUS
  Administered 2024-04-19: 100 ug/kg/min via INTRAVENOUS
  Administered 2024-04-19 (×2): 75 ug/kg/min via INTRAVENOUS
  Administered 2024-04-20 (×2): 150 ug/kg/min via INTRAVENOUS
  Administered 2024-04-20: 75 ug/kg/min via INTRAVENOUS
  Administered 2024-04-20: 124.916 ug/kg/min via INTRAVENOUS
  Administered 2024-04-20: 125 ug/kg/min via INTRAVENOUS
  Administered 2024-04-21 – 2024-04-22 (×3): 25 ug/kg/min via INTRAVENOUS
  Filled 2024-04-15 (×21): qty 100

## 2024-04-15 MED ORDER — SODIUM CHLORIDE 0.9 % IV SOLN
100.0000 mg | Freq: Once | INTRAVENOUS | Status: AC
  Administered 2024-04-16: 100 mg via INTRAVENOUS
  Filled 2024-04-15 (×2): qty 100

## 2024-04-15 MED ORDER — ONDANSETRON HCL 4 MG/2ML IJ SOLN
4.0000 mg | Freq: Once | INTRAMUSCULAR | Status: AC
  Administered 2024-04-15: 4 mg via INTRAVENOUS
  Filled 2024-04-15: qty 2

## 2024-04-15 NOTE — H&P (Incomplete)
 NAME:  Nathan Rivera, MRN:  [id number], DOB:  [birth year], LOS: 0 ADMISSION DATE:  04/15/2024, CONSULTATION DATE:  04/15/24 REFERRING MD: Nicholaus Maize, CHIEF COMPLAINT: coughing up bright red blood   HPI  78 y.o male with significant PMH of Infrarenal abdominal aortic aneurysm s/p endovascular repair of a ruptured AAA in 2011 on Xarelto , PAD, HTN, duodenal ulcer with perforation s/p omental patch repair, CKD stage III, thrombocytopenia, HDL, prior tobacco use, ESBL bacteremia, bladder cancer, chronic back pain, and bilateral inguinal hernia s/p repair who presented to the ED with chief complaints of abdominal pain and hemoptysis.  Patient and family at bedside reports that he took his last dose of Xarelto  yesterday evening.  He subsequently developed some abdominal pain around the epigastric and left upper quadrant region.  Since onset of symptoms has been coughing up bright red blood which he describes  blood filling up his mouth.  Denies other associated symptoms   ED Course: Initial vital signs showed HR of 68 beats/minute, BP 192/102 mm Hg, the RR 18 breaths/minute, and the oxygen saturation 93% on RA and a temperature of 98.47F (36.7C). Pertinent Labs/Diagnostics Findings: Na+/ K+:140/4.2 Glucose:94  BUN/Cr.:39/2.43 TAR:Lwmzfjmxjaoz  PCT: negative <0.10  BNP: 394.5  CTA Chest> CT Abd/pelvis> Saccular aneurysm at the level of the proximal stent with some soft tissue thickening and stranding concerning possible endoleak or impending rupture,Large aneurysm involving the left common iliac artery, partially encompasses the left iliac stent, Aneurysmal dilatation of the right common iliac artery at the distal end of the stent  Multiple small aneurysms of the proximal internal and iliac vessels.   CT findings discussed with on-call vascular Dr. Marea who recommended hemodynamic monitoring as it is unlikely patient will require emergent intervention but may require repair during the course of  admission. PCCM consulted for admission, BP management and close monitoring.   Past Medical History  Pure hypercholesterolemia  CKD stage III Duodenal ulcer; Perforated gastric ulcer s/p omental patch repair Peripheral vascular disease  Infrarenal abdominal aortic aneurysm s/p endovascular repair of a ruptured AAA in 2011. Aorto bifemoral endograft placement for AAA,  Chronic right-sided low back pain without sciatica;  Thrombocytopenia  Raynaud's disease with gangrene  B12 deficiency;  Personal history of malignant neoplasm of bladder;  Tobacco use disorder;  Hematuria, gross  ESBL Bacteremia due to septic shock due to obstructive stone with Pyelonephritis bilateral Inguinal Hernia s/p repair (02/24)  Significant Hospital Events   7/21:Admit with Endoleak post Endovascular aneurysm repair (EVAR) and hypertensive crisis requiring cleviprex  and esmolol  gtt   Consults:  [facility]  Procedures:  None  Interim History / Subjective:      Micro Data:  7/21: Blood culture x2> 7/21: MRSA PCR>>   Antimicrobials:  Azithromycin  Ceftriaxone   OBJECTIVE  Blood pressure 131/69, pulse (!) 52, temperature 97.9 F (36.6 C), temperature source Oral, resp. rate 17, height 5' 9 (1.753 m), weight 94.1 kg, SpO2 98%.       No intake or output data in the 24 hours ending 04/15/24 2341 Filed Weights   04/15/24 1430 04/15/24 2158  Weight: 99.8 kg 94.1 kg    Physical Examination  GENERAL: The patient is alert, oriented and in no acute distress.  HEENT: Head is normocephalic/atraumatic. Pupils equal, round and reactive to light and accommodation. Extraocular movements intact. Nose and throat are clear. NECK/LYMPHATICS: Supple without thyromegaly or lymphadenopathy.  RESPIRATORY: Lungs Clear to auscultation  CARDIAC: Regular rate and rhythm, normal S1 and S2 without murmurs, rubs or  gallops.  VASCULAR: Distal pulses 2+.  GASTROINTESTINAL Soft. No organomegaly or tenderness found. No  CVA tenderness.  MUSCULOSKELETAL: No cyanosis, clubbing trace edema in BLE noted.  NEUROLOGIC: The patient is alert and oriented x 3. No focal deficits.   Labs/imaging that I havepersonally reviewed  (right click and Reselect all SmartList Selections daily)   CT Angio Chest/Abd/Pel for Dissection W and/or Wo Contrast Result Date: 04/15/2024 CLINICAL DATA:  Coughing up blood EXAM: CT ANGIOGRAPHY CHEST, ABDOMEN AND PELVIS TECHNIQUE: Non-contrast CT of the chest was initially obtained. Multidetector CT imaging through the chest, abdomen and pelvis was performed using the standard protocol during bolus administration of intravenous contrast. Multiplanar reconstructed images and MIPs were obtained and reviewed to evaluate the vascular anatomy. RADIATION DOSE REDUCTION: This exam was performed according to the departmental dose-optimization program which includes automated exposure control, adjustment of the mA and/or kV according to patient size and/or use of iterative reconstruction technique. CONTRAST:  80mL OMNIPAQUE  IOHEXOL  350 MG/ML SOLN COMPARISON:  CT 10/20/2022 FINDINGS: CTA CHEST FINDINGS Cardiovascular: Non contrasted images of the chest demonstrate no acute intramural hematoma. Negative for aortic dissection. Focal outpouching measuring 14 mm at the distal arch either representing prominent ductus diverticulum or aneurysm, but sludge least stable size compared with noncontrast CT from 2015. No extravasation identified within the chest. Coronary vascular calcification. Normal cardiac size. No pericardial effusion. Mediastinum/Nodes: Patent trachea. No thyroid mass. No suspicious lymph nodes. Esophagus within normal limits. Calcified nodes consistent with prior granulomatous disease. Lungs/Pleura: No pleural effusion or pneumothorax. Patchy mild peribronchovascular ground-glass disease. No consolidation. Musculoskeletal: Sternum appears intact. No acute osseous abnormality. Review of the MIP images  confirms the above findings. CTA ABDOMEN AND PELVIS FINDINGS VASCULAR Aorta: Aortic atherosclerosis. Status post infrarenal abdominal aortoiliac stent graft. The proximal end of the stent is just below the level of the renal arteries. Aneurysmal dilatation of the abdominal aorta at the level of the renal arteries, slightly superior to the proximal end of the stent, this measures 4.6 x 3.7 cm on axial series 6, image 180, previously 4.2 by 3.2 cm. Saccular appearing component of the aneurysm at the level of the stent, coronal series 11, image 74, this appears increased in size compared to the non contrasted exam from January and there may be some soft tissue thickening along the saccular portion of the aneurysm on coronal series 11, image 73. No dissection is seen. 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 Possible small 5 mm aneurysm at the origin of the celiac artery, series 12, image 108. SMA: Mild-to-moderate stenosis of the proximal SMA just distal to the origin, series 12, image 111. Distal vessel patency without dissection. Renals: Accessory upper pole right renal artery with severe stenosis at the origin, series 11, image 80. 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 String like flow at the proximal right renal artery, series 11 image 75, consistent with severe stenosis over a 12 mm length. Small accessory upper pole renal artery on the left with suspected severe stenosis at the origin. Dominant left renal artery with severe stenosis at the origin, series 6, image 172 and coronal series 11 image 74 and 75. IMA: Poorly visible, small vessel supplying the perirectal is traceable to the iliac stent supplying the left iliac vessel. 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 Aneurysmal dilatation of the right common  iliac artery at the  distal end of the stent measuring up to 2.8 cm on series 11, image 73. Multiple small aneurysms of the proximal internal and iliac vessels. No high-grade stenosis or occlusive disease of the distal external iliac vessels. Veins: Suboptimally assessed Review of the MIP images confirms the above findings. NON-VASCULAR Hepatobiliary: Small gallstone. No biliary dilatation. No focal hepatic abnormality. Pancreas: Unremarkable. No pancreatic ductal dilatation or surrounding inflammatory changes. Spleen: Multiple calcified granuloma Adrenals/Urinary Tract: Adrenal glands are within normal limits. Kidneys show no hydronephrosis. Multiple renal cysts for which no imaging follow-up is recommended. Slightly thick-walled urinary bladder. Indeterminate slightly dense exophytic lesion off the upper pole right kidney measuring 17 mm, series 6, image 168. Stomach/Bowel: The stomach is non enlarged. There is no dilated small bowel. No acute bowel wall thickening. Negative appendix. Diverticular disease of the colon. Lymphatic: No suspicious lymph nodes. Reproductive: Negative prostate Other: Negative for pelvic effusion or free air. Previously noted bilateral inguinal hernias are resolved/repair. Musculoskeletal: No acute or suspicious osseous abnormality. Review of the MIP images confirms the above findings. IMPRESSION: 1. Negative for acute aortic dissection or intramural hematoma. No extravasation identified within the chest, abdomen, or pelvis. 2. Status post infrarenal abdominal aortoiliac stent graft. Aneurysmal dilatation of the abdominal aorta at the level of the renal arteries, slightly superior to the proximal end of the stent, this measures 4.6 x 3.7 cm, previously 4.2 x 3.2 cm. Saccular appearing component of the aneurysm at the level of the proximal stent appears increased in size compared to the non contrasted exam from January and there may be some soft tissue thickening and stranding along the inferior aspect of the  saccular part of the aneurysm raising concern for possible leakage or impending rupture, vascular surgery consultation is recommended. 3. 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 Aneurysmal dilatation of the right common iliac artery at the distal end of the stent measuring up to 2.8 cm. Multiple small aneurysms of the proximal internal and iliac vessels. 4. 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 There are small accessory upper pole renal arteries bilaterally with suspected severe stenosis at the origin of these vessels. 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 5. 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 Possible small 5 mm aneurysm at the origin of the celiac artery. 6. 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 Patchy mild peribronchovascular ground-glass disease within the lungs, suggestive of respiratory infection. No consolidative pneumonia. 8. Cholelithiasis. Diverticular disease of the colon. 9. 16 mm indeterminate exophytic lesion off the upper pole right kidney, could initially correlate with ultrasound 10. Slightly thick-walled urinary bladder, correlate for cystitis. 11. Interval bilateral inguinal hernia repair. 12. Aortic atherosclerosis. Aortic Atherosclerosis (ICD10-I70.0). Electronically Signed   By: Luke Bun M.D.   On: 04/15/2024 19:47   DG Chest 2 View Result Date: 04/15/2024 CLINICAL DATA:  Hemoptysis since last night, worsening today. Increased lower extremity swelling for 1 week. EXAM: CHEST - 2 VIEW COMPARISON:  Radiographs 08/26/2013 and 08/24/2013. Chest CT 04/01/2014. FINDINGS: The lateral view was repeated. The heart size and mediastinal contours are normal. 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 The lungs appear clear. No pleural effusion or pneumothorax. Calcified splenic  granulomas are noted. Possible old rib fractures bilaterally without acute osseous abnormality. IMPRESSION: No radiographic evidence of active cardiopulmonary process. If  the patient has persistent unexplained hemoptysis, follow-up CT should be considered. Electronically Signed   By: Elsie Perone M.D.   On: 04/15/2024 15:17    Labs   CBC: Recent Labs  Lab 04/15/24 1447  WBC 9.1  HGB 15.3  HCT 47.3  MCV 91.7  PLT 160    Basic Metabolic Panel: Recent Labs  Lab 04/15/24 1447  NA 140  K 4.2  CL 105  CO2 24  GLUCOSE 94  BUN 39*  CREATININE 2.43*  CALCIUM 9.4   GFR: Estimated Creatinine Clearance: 28.4 mL/min (A) (by C-G formula based on SCr of 2.43 mg/dL (H)). Recent Labs  Lab 04/15/24 1447 04/15/24 2030 04/15/24 2218  PROCALCITON  --  <0.10  --   WBC 9.1  --   --   LATICACIDVEN  --  0.9 1.2    Liver Function Tests: No results for input(s): AST, ALT, ALKPHOS, BILITOT, PROT, ALBUMIN  in the last 168 hours. No results for input(s): LIPASE, AMYLASE in the last 168 hours. No results for input(s): AMMONIA in the last 168 hours.  ABG    Component Value Date/Time   PHART 7.273 (L) 08/24/2013 2337   PCO2ART 28.9 (L) 08/24/2013 2337   PO2ART 84.0 08/24/2013 2337   HCO3 13.4 (L) 08/24/2013 2337   TCO2 14 08/24/2013 2337   ACIDBASEDEF 12.0 (H) 08/24/2013 2337   O2SAT 95.0 08/24/2013 2337     Coagulation Profile: Recent Labs  Lab 04/15/24 1806  INR 1.3*    Cardiac Enzymes: No results for input(s): CKTOTAL, CKMB, CKMBINDEX, TROPONINI in the last 168 hours.  HbA1C: No results found for: HGBA1C  CBG: Recent Labs  Lab 04/15/24 2153  GLUCAP 100*    Review of Systems:   Unable to be obtained secondary to the patient's impaired memory  Past Medical History  He,  has a past medical history of Alzheimer dementia [facility]), Aortic aneurysm (HCC), Arthritis, Cancer (HCC) (2012), CKD (chronic kidney disease), stage III (HCC) (2014),  Degenerative disc disease, Degenerative joint disease, GERD (gastroesophageal reflux disease), History of kidney stones, Hypercholesterolemia, Hypertension, Impaired glucose tolerance, Muscle fatigue, Peripheral vascular disease (HCC), Pneumonia, Raynaud's disease, Sepsis (HCC) (07/2013), Stroke [facility]), Thromboembolism of left popilteal vein (2015), and UTI (urinary tract infection) (08/17/2013).   Surgical History    Past Surgical History:  Procedure Laterality Date   ABDOMINAL AORTIC ANEURYSM REPAIR  2011   CATARACT EXTRACTION W/PHACO Left 02/15/2021   Procedure: CATARACT EXTRACTION PHACO AND INTRAOCULAR LENS PLACEMENT (IOC) LEFT 5.59 00:44.0;  Surgeon: Myrna Adine Anes, MD;  Location: [facility];  Service: Ophthalmology;  Laterality: Left;   CATARACT EXTRACTION W/PHACO Right 03/08/2021   Procedure: CATARACT EXTRACTION PHACO AND INTRAOCULAR LENS PLACEMENT (IOC) RIGHT 1.81 00:22.7;  Surgeon: Myrna Adine Anes, MD;  Location[facility];  Service: Ophthalmology;  Laterality: Right;   HERNIA REPAIR     13mo old   INSERTION OF MESH N/A 10/24/2014   Procedure: INSERTION OF MESH;  Surgeon: Deward Null III, MD;  Location: [facility] OR;  Service: General;  Laterality: N/A;   LAPAROTOMY N/A 08/24/2013   Procedure: EXPLORATORY LAPAROTOMY;  Surgeon: Deward GORMAN Null DOUGLAS, MD;  Location: [facility] OR;  Service: General;  Laterality: N/A;  Gram patch closure   litrotripsy  09/24/2013   NEPHROSTOMY  Nov. 2014   nephrostomy removal  Dec. 2014   PERIPHERALLY INSERTED CENTRAL CATHETER INSERTION     TONSILLECTOMY     UMBILICAL HERNIA REPAIR     50 yr ago   VENTRAL  HERNIA REPAIR N/A 10/24/2014   Procedure: LAPAROSCOPIC ASSISTED VENTRAL HERNIA REPAIR WITH MESH;  Surgeon: Deward Null III, MD;  Location: [facility] OR;  Service: General;  Laterality: N/A;   XI ROBOTIC ASSISTED INGUINAL HERNIA REPAIR WITH MESH Bilateral 11/17/2022   Procedure: XI ROBOTIC ASSISTED INGUINAL HERNIA REPAIR WITH MESH-Bilateral;  Surgeon: Desiderio Schanz, MD;  Location: [facility] ORS;  Service: General;  Laterality: Bilateral;     Social History   reports that he has been smoking cigarettes. He has a 40 pack-year smoking history. He has been exposed to tobacco smoke. He has never used smokeless tobacco. He reports current alcohol use of about 3.0 standard drinks of alcohol per week. He reports that he does not use drugs.   Family History   His family history includes Atrial fibrillation in his sister; CVA in his father.   Allergies Allergies  Allergen Reactions   Duloxetine Dermatitis     Home Medications  Prior to Admission medications   Medication Sig Start Date End Date Taking? Authorizing Provider  acetaminophen  (TYLENOL ) 325 MG tablet Take 325 mg by mouth every 6 (six) hours as needed for mild pain.   Yes [provider]  amLODipine  (NORVASC ) 5 MG tablet TAKE 1 TABLET BY MOUTH  DAILY 12/24/19  Yes Brown, Fallon E, NP  cholecalciferol (VITAMIN D) 1000 UNITS tablet Take 1,000 Units by mouth daily.   Yes [provider]  Cyanocobalamin (VITAMIN B-12 ER PO) Take by mouth.   Yes [provider]  donepezil  (ARICEPT ) 10 MG tablet Take 10 mg by mouth at bedtime.   Yes [provider]  FOLIC ACID PO Take by mouth.   Yes [provider]  gabapentin  (NEURONTIN ) 100 MG capsule 200 mg 2 (two) times daily. 200 mg in am and 200 mg in PM 04/01/17  Yes [provider]  Lactobacillus ([employer] PROBIOTIC ACIDOPHILUS) 10 MG CAPS Take by mouth.   Yes [provider]  memantine (NAMENDA) 10 MG tablet TAKE 1 TABLET BY MOUTH TWICE DAILY FOR MEMORY LOSS 02/28/24  Yes [provider]  pantoprazole  (PROTONIX ) 40 MG tablet Take 40 mg by mouth at bedtime.  11/13/13  Yes [provider]  simvastatin  (ZOCOR ) 20 MG tablet Take 20 mg by mouth at bedtime.   Yes [provider]  vitamin B-12 (CYANOCOBALAMIN) 500 MCG tablet Take 500 mcg by mouth daily.   Yes [provider]   XARELTO  20 MG TABS tablet daily.  12/23/13  Yes [provider]  amoxicillin -clavulanate (AUGMENTIN ) 875-125 MG tablet :1 Tablet(s) By Mouth Every 12 Hours Patient not taking: Reported on 04/15/2024 02/20/24   [provider]  pregabalin (LYRICA) 100 MG capsule Take 100 mg by mouth 2 (two) times daily. Patient not taking: Reported on 04/15/2024 12/20/23   [provider]  Scheduled Meds:  pantoprazole   40 mg Oral BID   Continuous Infusions:  clevidipine  14 mg/hr (04/16/24 0054)   doxycycline  (VIBRAMYCIN ) IV 100 mg (04/16/24 0055)   esmolol  35 mcg/kg/min (04/15/24 2243)   PRN Meds:.docusate sodium , polyethylene glycol  Active Hospital Problem list   See systems below  Assessment & Plan:  #Endoleak with Aneurysm Expansion  Hx Infrarenal AAA s/p Endovascular Repair (2011) on Xarelto , -Cleviprex  gtt for goal HR <60 & SBP 100-120 -Add Esmolol  to maintain goal BP -Pain control -Vascular Surgery consulted, appreciate input, discussed with Dr. Marea who recommends hemodynamic stability as above and possible repair at some point during hospitalization.  #Acute Hemoptysis concerns for Upper GI  Bleed? Hx of perforated Gastric ulcer s/p omental patch repair CT with no evidence of bleed -H&H monitoring q6h -Transfuse PRN Hgb<7 -Pantoprazole  40mg  IV  BID -NPO  -GI Consult for EGD if appropriate -Hold NSAIDs, steroids, ASA  #Hypertensive Crisis #Hx of poorly controlled HTN #HLD #Bradycardia at baseline-Hold Aricept  BP on admission SBP>200 without end-organ dysfunction -Continue Cleviprex  and Esmolol  gtt as above -Coag, Mg, serial Trop -restart home bp meds while titrating off gtts -Statin, ASA -Obtain Echo  #AKI on CKD stage III: AKI likely post contrast -Prot/Cr ratio  -Fe studies, Vit B12, folate  -F/u BMP+mag -promote renal perfusion -Avoid nephrotoxic agents  -strict I&Os -consider Renal U/S   #Suspected Pneumonia on CT without s/s of infection -F/u  cultures, trend lactic/ PCT -Monitor WBC/ fever curve -Hold antibiotics for now pending cultures -Strict I/O's  #MCI -Hold Namenda and Aricept   Best practice:  Diet:  NPO Pain/Anxiety/Delirium protocol (if indicated): No VAP protocol (if indicated): Not indicated DVT prophylaxis: Contraindicated GI prophylaxis: PPI Glucose control:  SSI No Central venous access:  N/A Arterial line:  N/A Foley:  N/A Mobility:  bed rest  PT consulted: N/A Last date of multidisciplinary goals of care discussion []  Code Status:  full code Disposition: ICU   = Goals of Care = Code Status Order: FULL  Primary Emergency ContactTrentyn, [place], Home Phone: [phone] Wishes to pursue full aggressive treatment and intervention options, including CPR and intubation, but goals of care will be addressed on going with family if that should become necessary.   Critical care time: 45 minutes       Almarie Nose DNP, CCRN, FNP-C, AGACNP-BC Acute Care & Family Nurse Practitioner [facility] PCCM on call pager [phone]

## 2024-04-15 NOTE — Progress Notes (Addendum)
 eLink Physician-Brief Progress Note Patient Name: Nathan Rivera DOB: 09/11/1946 MRN: [id number]   Date of Service  04/15/2024  HPI/Events of Note  45 M hx of dyslipidemia, impaired glucose tolerance, CKD, PVD, ruptured infrarenal AAA s/p repair 2 years ago on rivaroxaban , duodenal ulcer, sciatica, Raynauds, thrombocytopenia, bladder CA. He presents with abdominal and blood filling his oral cavity everytime he coughs. He was hypertensive in the ED 226/108 started on Cleviprex . H/H 15.3/47.3, INR 1.3, aPTT <22, creatinine 2.43.  Abdominal CT with concern for possible leakage or impending rupture of AA proximal to the stent, severe stenosis of both dominant renal arteries with exophytic lesion off the upper pole right kidney, patchy mild peribronchovascular ground glass disease  eICU Interventions  Hemoptysis vs hematemesis, although impressive as per ED description, H/H is adequate hence anticoagulation not reversed, will continue to monitor. Given pantoprazole . AAA with concerning findings, BP and HR control Hypertension with renal artery stenosis noted on CT, also with exophytic lesion. On Cleviprex , renal ultrasound suggested     Intervention Category Evaluation Type: New Patient Evaluation  Damien ONEIDA Grout 04/15/2024, 10:04 PM

## 2024-04-15 NOTE — ED Provider Notes (Signed)
 [facility] Provider Note    Event Date/Time   First MD Initiated Contact with Patient 04/15/24 1736     (approximate)   History   Hemoptysis   HPI  Nathan Rivera is a 78 y.o. male  with pmh of hyperlipidemia, impaired glucose tolerance, stage III chronic kidney disease, duodenal ulcer, peripheral vascular disease, ruptured AAA status postrepair 2 years ago, chronic low back pain with right-sided sciatica, thrombocytopenia, Raynaud's disease, B12 deficiency, history of bladder cancer tobacco use, on Xarelto  who presents to the emergency department with an episode of abdominal pain and subsequent hemoptysis.  Patient states that he last took his Xarelto  yesterday evening.  He subsequently developed some epigastric and left upper quadrant abdominal pain.  Since then he has been coughing up blood (denies emesis).  The blood feels his mouth and he is able to spit it out.  Denies any nosebleed but states that recently given the amount of blood in his mouth it has come out of his nose bilaterally.  He denies chest pain but does endorse shortness of breath.  Denies any fevers chills changes in urinary or bowel habits.  His wife presents at bedside who helps contribute to the history      Physical Exam   Triage Vital Signs: ED Triage Vitals [04/15/24 1430]  Encounter Vitals Group     BP (!) 192/102     Girls Systolic BP Percentile      Girls Diastolic BP Percentile      Boys Systolic BP Percentile      Boys Diastolic BP Percentile      Pulse Rate 68     Resp 18     Temp 98.1 F (36.7 C)     Temp Source Oral     SpO2 93 %     Weight 220 lb (99.8 kg)     Height 5' 10 (1.778 m)     Head Circumference      Peak Flow      Pain Score 0     Pain Loc      Pain Education      Exclude from Growth Chart     Most recent vital signs: Vitals:   04/15/24 1945 04/15/24 2000  BP: (!) 205/114 (!) 200/99  Pulse: 78 (!) 57  Resp: 15 16  Temp:    SpO2: (!) 85% 95%     Nursing Triage Note reviewed. Vital signs reviewed and patients oxygen saturation is borderline hypoxic   General: Patient is well nourished, well developed, awake and alert, appears unwell  Head: Normocephalic and atraumatic Eyes: Normal inspection, extraocular muscles intact, no conjunctival pallor Ear, nose, throat: Normal external exam  When patient coughs, his mouth feels with blood.  There is no visible bleed in his bilateral nares.  Blood is bright red  Neck: Normal range of motion Respiratory: Patient is in no respiratory distress, lungs CTAB Cardiovascular: Patient is not tachycardic, RRR without murmur appreciated GI: Abdomen is soft but he does have tenderness to palpation in the epigastrium, no rebound or guarding  Back: Normal inspection of the back with good strength and range of motion throughout all ext Extremities: pulses intact with good cap refills, no LE pitting edema or calf tenderness Neuro: The patient is alert and oriented to person, place, and time, appropriately conversive, with 5/5 bilat UE/LE strength, no gross motor or sensory defects noted. Coordination appears to be adequate. Skin: Warm, dry, and intact Psych: anxious mood and  affect, no SI or HI  ED Results / Procedures / Treatments   Labs (all labs ordered are listed, but only abnormal results are displayed) Labs Reviewed  BASIC METABOLIC PANEL WITH GFR - Abnormal; Notable for the following components:      Result Value   BUN 39 (*)    Creatinine, Ser 2.43 (*)    GFR, Estimated 27 (*)    All other components within normal limits  PROTIME-INR - Abnormal; Notable for the following components:   Prothrombin Time 16.9 (*)    INR 1.3 (*)    All other components within normal limits  APTT - Abnormal; Notable for the following components:   aPTT <22 (*)    All other components within normal limits  BRAIN NATRIURETIC PEPTIDE - Abnormal; Notable for the following components:   B Natriuretic  Peptide 394.5 (*)    All other components within normal limits  CULTURE, BLOOD (ROUTINE X 2)  CULTURE, BLOOD (ROUTINE X 2)  CBC  PROCALCITONIN  LACTIC ACID, PLASMA  LACTIC ACID, PLASMA  TYPE AND SCREEN  TYPE AND SCREEN  TYPE AND SCREEN  TROPONIN I (HIGH SENSITIVITY)  TROPONIN I (HIGH SENSITIVITY)     EKG EKG and rhythm strip are interpreted by myself:   EKG: [Normal sinus rhythm] at heart rate of 73, normal QRS duration, QTc 436, nonspecific ST segments and T waves no ectopy EKG not consistent with Acute STEMI Rhythm strip: NSR in lead II   RADIOLOGY XR chest: No acute abnormality on my independent review interpretation radiologist agrees CTA chest/abdomen/pelvis aortic dissection protocol:     PROCEDURES:  Critical Care performed: Yes, see critical care procedure note(s)  .Critical Care  Performed by: Nicholaus Rolland BRAVO, MD Authorized by: Nicholaus Rolland BRAVO, MD   Critical care provider statement:    Critical care time (minutes):  45   Critical care was necessary to treat or prevent imminent or life-threatening deterioration of the following conditions:  Circulatory failure   Critical care was time spent personally by me on the following activities:  Development of treatment plan with patient or surrogate, discussions with consultants, evaluation of patient's response to treatment, examination of patient, ordering and review of laboratory studies, ordering and review of radiographic studies, ordering and performing treatments and interventions, pulse oximetry, re-evaluation of patient's condition and review of old charts Comments:     Acute hemorrhage requiring frequent reassessments, multiple conversations with consultants    MEDICATIONS ORDERED IN ED: Medications  labetalol  (NORMODYNE ) injection 5 mg (has no administration in time range)  cefTRIAXone  (ROCEPHIN ) 2 g in sodium chloride  0.9 % 100 mL IVPB (has no administration in time range)  doxycycline  (VIBRAMYCIN ) 100  mg in sodium chloride  0.9 % 250 mL IVPB (has no administration in time range)  clevidipine  (CLEVIPREX ) infusion 0.5 mg/mL (has no administration in time range)  ondansetron  (ZOFRAN ) injection 4 mg (4 mg Intravenous Given 04/15/24 1835)  pantoprazole  (PROTONIX ) injection 40 mg (40 mg Intravenous Given 04/15/24 1835)  iohexol  (OMNIPAQUE ) 350 MG/ML injection 80 mL (80 mLs Intravenous Contrast Given 04/15/24 1820)  labetalol  (NORMODYNE ) injection 5 mg (5 mg Intravenous Given 04/15/24 1929)     IMPRESSION / MDM / ASSESSMENT AND PLAN / ED COURSE                                Differential diagnosis includes, but is not limited to, bronchopleural fistula, PE, aortic dissection, endoleak, aorta bronco  fistula, anemia   ED course: Patient presents acutely but is not hypotensive and not acutely anemic at this time.  He does have an acute renal insufficiency slightly more than his baseline.  I was surprised by the amount of blood that filled his mouth with each cough.  It is a difficult picture to ascertain whether this is cardiovascular, pulmonary or GI bleed given his history of AAA and preceding symptoms starting with abdominal pain but cough causes hemoptysis.  Given his history of AAA and the potential for fistula formulation, I have ordered a CTA chest abdomen pelvis with IV contrast dissection protocol after talking to the radiologist.  Patient acknowledges that this may worsen his kidney function but opts to proceed.  I have considered reversing the patient and providing 4 factor PCC, but given that he is not acutely anemic and he is not hypotensive we will hold off unless he becomes hemodynamically unstable.  We have inserted 2 IVs and prophylactically placed the patient on oxygen.    Clinical Course as of 04/15/24 2038  Mon Apr 15, 2024  1738 HCT: 47.3 Not profoundly anemic [HD]  1804 Went immediately to bedside and evaluated patient. He is cough up new blood that fills up entire mouth.  He is  endorsing abdominal pain although abdomen is soft and only tender in the epigastrium.  There is a copious amount of blood in his mouth however he is protecting his airway and he is able to spit this out, patient's creatinine today is 2.43 and I reviewed the indications benefits risks and alternatives of CT contrast-induced nephropathy with the patient and his wife at bedside including worsening kidney function need for eventual dialysis and they voiced understanding.  However this is the best that he we have given patient's presentation to determine whether bleeding is coming from.  They have opted to proceed.  I subsequently called the [facility] waiting room and talked to radiologist Dr. Skipper and explained the situation including the patient's past medical history.  We are both in agreement that this is the best study at this time and we will proceed.  CT waiting room updated and they will take the patient shortly [HD]  1841 B Natriuretic Peptide(!): 394.5 No old to compare [HD]  1847 Called [facility] to expedite radiology read.  [HD]  1850 BP(!): 192/102 Patient's blood pressure was elevated but given the amount of hemoptysis would prefer not to lower this as he is already bradycardic we will have a low threshold if there is evidence of dissection on imaging [HD]  1926 BP(!): 226/108 I did give a dose of labetolol for this elevated [HD]  2002 CT Angio Chest/Abd/Pel for Dissection W and/or Wo Contrast Will touch base with vascular surgery [HD]  2012 Case discussed with Dr. Marea who read over the radiology read.  Although he reports that these are signficant findings and patient may likely require a procedure this admission this does not explain patient's hemoptysis.  He states that should the patient become hemodynamically stable at any time or have massive hemoptysis it would be okay to reverse some using Four factor PCC.  [HD]  2015 Patient updated along with family.  Abdominal exam unchanged.  [HD]  2018 Case discussed with ICU and plan for admission they will start him on a Cleviprex  drip [HD]  2019 ICU APP notified that nephrology should likely be consulted in the morning given the scan with low GFR [HD]    Clinical Course User Index [  HD] Nicholaus Rolland BRAVO, MD     FINAL CLINICAL IMPRESSION(S) / ED DIAGNOSES   Final diagnoses:  Major hemoptysis  Anticoagulated  Epigastric pain  S/P AAA repair  Abdominal aneurysm (HCC)  Hypertensive crisis  Acute renal insufficiency     Rx / DC Orders   ED Discharge Orders     None        Note:  This document was prepared using Dragon voice recognition software and may include unintentional dictation errors.   Nicholaus Rolland BRAVO, MD 04/15/24 2038

## 2024-04-15 NOTE — ED Triage Notes (Signed)
 Pt presents with hemoptysis that started last night and become worse today. Pt has had increased swelling in his lower extremities x 1 week He denies any fever, ShOB, CP, or N/V.SABRA

## 2024-04-16 ENCOUNTER — Inpatient Hospital Stay

## 2024-04-16 ENCOUNTER — Encounter
Admission: EM | Disposition: A | Discharge status: Home Health | Payer: Self-pay | Source: Home / Self Care | Attending: Student in an Organized Health Care Education/Training Program

## 2024-04-16 DIAGNOSIS — R1013 Epigastric pain: Secondary | ICD-10-CM

## 2024-04-16 DIAGNOSIS — J189 Pneumonia, unspecified organism: Secondary | ICD-10-CM

## 2024-04-16 DIAGNOSIS — Z8679 Personal history of other diseases of the circulatory system: Secondary | ICD-10-CM

## 2024-04-16 DIAGNOSIS — N289 Disorder of kidney and ureter, unspecified: Secondary | ICD-10-CM

## 2024-04-16 DIAGNOSIS — I723 Aneurysm of iliac artery: Secondary | ICD-10-CM | POA: Diagnosis not present

## 2024-04-16 DIAGNOSIS — I714 Abdominal aortic aneurysm, without rupture, unspecified: Secondary | ICD-10-CM | POA: Diagnosis not present

## 2024-04-16 DIAGNOSIS — R042 Hemoptysis: Secondary | ICD-10-CM | POA: Diagnosis not present

## 2024-04-16 DIAGNOSIS — N179 Acute kidney failure, unspecified: Secondary | ICD-10-CM | POA: Diagnosis not present

## 2024-04-16 DIAGNOSIS — T82310A Breakdown (mechanical) of aortic (bifurcation) graft (replacement), initial encounter: Secondary | ICD-10-CM | POA: Diagnosis not present

## 2024-04-16 DIAGNOSIS — I169 Hypertensive crisis, unspecified: Secondary | ICD-10-CM

## 2024-04-16 DIAGNOSIS — K920 Hematemesis: Secondary | ICD-10-CM

## 2024-04-16 DIAGNOSIS — K317 Polyp of stomach and duodenum: Secondary | ICD-10-CM

## 2024-04-16 DIAGNOSIS — N183 Chronic kidney disease, stage 3 unspecified: Secondary | ICD-10-CM

## 2024-04-16 HISTORY — PX: ESOPHAGOGASTRODUODENOSCOPY: SHX5428

## 2024-04-16 LAB — RESPIRATORY PANEL BY PCR

## 2024-04-16 LAB — CBC
HCT: 44 % (ref 39.0–52.0)
Hemoglobin: 14.5 g/dL (ref 13.0–17.0)
MCH: 30.1 pg (ref 26.0–34.0)
MCHC: 33 g/dL (ref 30.0–36.0)
MCV: 91.3 fL (ref 80.0–100.0)
Platelets: 165 K/uL (ref 150–400)
RBC: 4.82 MIL/uL (ref 4.22–5.81)
RDW: 15.1 % (ref 11.5–15.5)
WBC: 10.2 K/uL (ref 4.0–10.5)
nRBC: 0 % (ref 0.0–0.2)

## 2024-04-16 LAB — ABO/RH: ABO/RH(D): A POS

## 2024-04-16 LAB — PHOSPHORUS: Phosphorus: 3.9 mg/dL (ref 2.5–4.6)

## 2024-04-16 LAB — BASIC METABOLIC PANEL WITH GFR
Anion gap: 10 (ref 5–15)
BUN: 43 mg/dL — ABNORMAL HIGH (ref 8–23)
CO2: 24 mmol/L (ref 22–32)
Calcium: 8.8 mg/dL — ABNORMAL LOW (ref 8.9–10.3)
Chloride: 108 mmol/L (ref 98–111)
Creatinine, Ser: 2.44 mg/dL — ABNORMAL HIGH (ref 0.61–1.24)
GFR, Estimated: 26 mL/min — ABNORMAL LOW (ref >60)
Glucose, Bld: 96 mg/dL (ref 70–99)
Potassium: 4.2 mmol/L (ref 3.5–5.1)
Sodium: 142 mmol/L (ref 135–145)

## 2024-04-16 LAB — PROCALCITONIN: Procalcitonin: <0.1 ng/mL

## 2024-04-16 LAB — MAGNESIUM: Magnesium: 2.3 mg/dL (ref 1.7–2.4)

## 2024-04-16 MED ORDER — DIPHENHYDRAMINE HCL 50 MG/ML IJ SOLN: 50.0000 mg | Freq: Once | INTRAMUSCULAR | Status: DC | PRN

## 2024-04-16 MED ORDER — DEXMEDETOMIDINE HCL IN NACL 80 MCG/20ML IV SOLN
INTRAVENOUS | Status: DC | PRN
Start: 2024-04-16 — End: 2024-04-16
  Administered 2024-04-16: 4 ug via INTRAVENOUS

## 2024-04-16 MED ORDER — PROPOFOL 10 MG/ML IV BOLUS
INTRAVENOUS | Status: DC | PRN
  Administered 2024-04-16 (×2): 30 mg via INTRAVENOUS

## 2024-04-16 MED ORDER — AMLODIPINE BESYLATE 10 MG PO TABS
10.0000 mg | ORAL_TABLET | Freq: Every day | ORAL | Status: DC
  Administered 2024-04-16 – 2024-04-30 (×14): 10 mg via ORAL
  Filled 2024-04-16 (×14): qty 1

## 2024-04-16 MED ORDER — METHYLPREDNISOLONE SODIUM SUCC 125 MG IJ SOLR: 125.0000 mg | Freq: Once | INTRAMUSCULAR | Status: DC | PRN

## 2024-04-16 MED ORDER — HYDRALAZINE HCL 50 MG PO TABS
25.0000 mg | ORAL_TABLET | Freq: Three times a day (TID) | ORAL | Status: DC
  Administered 2024-04-16 – 2024-04-18 (×5): 25 mg via ORAL
  Filled 2024-04-16 (×5): qty 1

## 2024-04-16 MED ORDER — SODIUM CHLORIDE 0.9 % IV SOLN
2.0000 g | INTRAVENOUS | Status: AC
  Administered 2024-04-16 – 2024-04-19 (×4): 2 g via INTRAVENOUS
  Filled 2024-04-16 (×4): qty 20

## 2024-04-16 MED ORDER — PANTOPRAZOLE SODIUM 40 MG IV SOLR
40.0000 mg | Freq: Two times a day (BID) | INTRAVENOUS | Status: DC
  Administered 2024-04-16: 40 mg via INTRAVENOUS
  Filled 2024-04-16: qty 10

## 2024-04-16 MED ORDER — LACTATED RINGERS IV SOLN: INTRAVENOUS | Status: DC

## 2024-04-16 MED ORDER — CHLORHEXIDINE GLUCONATE 4 % EX SOLN
60.0000 mL | Freq: Once | CUTANEOUS | Status: AC
  Administered 2024-04-17: 4 via TOPICAL

## 2024-04-16 MED ORDER — FAMOTIDINE 20 MG PO TABS: 40.0000 mg | ORAL_TABLET | Freq: Once | ORAL | Status: DC | PRN

## 2024-04-16 MED ORDER — HYDRALAZINE HCL 20 MG/ML IJ SOLN
10.0000 mg | Freq: Four times a day (QID) | INTRAMUSCULAR | Status: DC | PRN
  Administered 2024-04-16 – 2024-04-28 (×8): 10 mg via INTRAVENOUS
  Filled 2024-04-16 (×8): qty 1

## 2024-04-16 MED ORDER — CHLORHEXIDINE GLUCONATE CLOTH 2 % EX PADS: 6.0000 | MEDICATED_PAD | Freq: Every day | CUTANEOUS | Status: DC

## 2024-04-16 MED ORDER — CHLORHEXIDINE GLUCONATE CLOTH 2 % EX PADS
6.0000 | MEDICATED_PAD | Freq: Every day | CUTANEOUS | Status: DC
  Administered 2024-04-16 – 2024-04-25 (×10): 6 via TOPICAL

## 2024-04-16 MED ORDER — MIDAZOLAM HCL 2 MG/ML PO SYRP
8.0000 mg | ORAL_SOLUTION | Freq: Once | ORAL | Status: DC | PRN
  Filled 2024-04-16: qty 5

## 2024-04-16 MED ORDER — CLEVIDIPINE BUTYRATE 0.5 MG/ML IV EMUL
0.0000 mg/h | INTRAVENOUS | Status: DC
  Filled 2024-04-16: qty 50

## 2024-04-16 MED ORDER — ORAL CARE MOUTH RINSE
15.0000 mL | OROMUCOSAL | Status: AC | PRN
Start: 2024-04-16 — End: ?

## 2024-04-16 MED ORDER — CEFAZOLIN SODIUM-DEXTROSE 2-4 GM/100ML-% IV SOLN
2.0000 g | INTRAVENOUS | Status: DC
  Filled 2024-04-16: qty 100

## 2024-04-16 MED ORDER — SODIUM CHLORIDE 0.9 % IV SOLN
500.0000 mg | INTRAVENOUS | Status: DC
  Administered 2024-04-16 – 2024-04-18 (×3): 500 mg via INTRAVENOUS
  Filled 2024-04-16 (×3): qty 5

## 2024-04-16 MED ORDER — SODIUM CHLORIDE 0.9 % IV SOLN: INTRAVENOUS | Status: DC

## 2024-04-16 MED ORDER — CLEVIDIPINE BUTYRATE 0.5 MG/ML IV EMUL
0.0000 mg/h | INTRAVENOUS | Status: DC
  Administered 2024-04-16: 12 mg/h via INTRAVENOUS
  Administered 2024-04-16: 16 mg/h via INTRAVENOUS
  Administered 2024-04-16: 21 mg/h via INTRAVENOUS
  Administered 2024-04-16: 18 mg/h via INTRAVENOUS
  Administered 2024-04-16 (×2): 21 mg/h via INTRAVENOUS
  Administered 2024-04-16: 14 mg/h via INTRAVENOUS
  Administered 2024-04-17 (×3): 21 mg/h via INTRAVENOUS
  Administered 2024-04-17: 11 mg/h via INTRAVENOUS
  Administered 2024-04-17 (×2): 21 mg/h via INTRAVENOUS
  Administered 2024-04-17: 13 mg/h via INTRAVENOUS
  Administered 2024-04-17: 21 mg/h via INTRAVENOUS
  Administered 2024-04-17: 19 mg/h via INTRAVENOUS
  Administered 2024-04-17: 21 mg/h via INTRAVENOUS
  Administered 2024-04-18 (×4): 9 mg/h via INTRAVENOUS
  Administered 2024-04-20: 2 mg/h via INTRAVENOUS
  Administered 2024-04-20 – 2024-04-21 (×3): 6 mg/h via INTRAVENOUS
  Administered 2024-04-21 – 2024-04-22 (×2): 2 mg/h via INTRAVENOUS
  Administered 2024-04-22: 1 mg/h via INTRAVENOUS
  Administered 2024-04-22: 4 mg/h via INTRAVENOUS
  Administered 2024-04-23: 2 mg/h via INTRAVENOUS
  Administered 2024-04-23: 4 mg/h via INTRAVENOUS
  Administered 2024-04-23: 2 mg/h via INTRAVENOUS
  Administered 2024-04-23: 4 mg/h via INTRAVENOUS
  Administered 2024-04-23: 2 mg/h via INTRAVENOUS
  Administered 2024-04-24: 4 mg/h via INTRAVENOUS
  Filled 2024-04-16 (×5): qty 100
  Filled 2024-04-16: qty 50
  Filled 2024-04-16: qty 100
  Filled 2024-04-16 (×3): qty 50
  Filled 2024-04-16 (×2): qty 100
  Filled 2024-04-16: qty 50
  Filled 2024-04-16 (×2): qty 100
  Filled 2024-04-16: qty 50
  Filled 2024-04-16: qty 100
  Filled 2024-04-16: qty 50
  Filled 2024-04-16: qty 100
  Filled 2024-04-16: qty 50
  Filled 2024-04-16: qty 100
  Filled 2024-04-16: qty 50
  Filled 2024-04-16 (×2): qty 100
  Filled 2024-04-16: qty 50
  Filled 2024-04-16 (×3): qty 100
  Filled 2024-04-16: qty 50
  Filled 2024-04-16: qty 100
  Filled 2024-04-16 (×2): qty 50
  Filled 2024-04-16: qty 100
  Filled 2024-04-16 (×4): qty 50
  Filled 2024-04-16 (×2): qty 100

## 2024-04-16 NOTE — Transfer of Care (Signed)
 Immediate Anesthesia Transfer of Care Note  Patient: Nathan Rivera  Procedure(s) Performed: EGD (ESOPHAGOGASTRODUODENOSCOPY)  Patient Location: ICU  Anesthesia Type:General  Level of Consciousness: sedated  Airway & Oxygen Therapy: Patient Spontanous Breathing and Patient connected to nasal cannula oxygen  Post-op Assessment: Report given to RN and Post -op Vital signs reviewed and stable  Post vital signs: Reviewed and stable  Last Vitals:  Vitals Value Taken Time  BP 115/59 04/16/24 13:07  Temp    Pulse 47 04/16/24 13:22  Resp 13 04/16/24 13:22  SpO2 94 % 04/16/24 13:22  Vitals shown include unfiled device data.  Last Pain:  Vitals:   04/16/24 1307  TempSrc:   PainSc: Asleep         Complications: No notable events documented.

## 2024-04-16 NOTE — Anesthesia Postprocedure Evaluation (Signed)
 Anesthesia Post Note  Patient: Nathan Rivera  Procedure(s) Performed: EGD (ESOPHAGOGASTRODUODENOSCOPY)  Patient location during evaluation: PACU Anesthesia Type: General Level of consciousness: awake Pain management: pain level controlled Vital Signs Assessment: post-procedure vital signs reviewed and stable Respiratory status: spontaneous breathing Cardiovascular status: stable Anesthetic complications: no   No notable events documented.   Last Vitals:  Vitals:   04/16/24 1302 04/16/24 1307  BP: 114/63 (!) 115/59  Pulse: (!) 43 (!) 42  Resp: 20 16  Temp:    SpO2: 94%     Last Pain:  Vitals:   04/16/24 1307  TempSrc:   PainSc: Asleep                 VAN STAVEREN,Kamaree Berkel

## 2024-04-16 NOTE — TOC Initial Note (Signed)
 Transition of Care [facility]) - Initial/Assessment Note    Patient Details  Name: Nathan Rivera MRN: [id number] Date of Birth: 04-28-1946  [facility] CM/SW Contact:    Delphine KANDICE Bring, RN Phone Number: 04/16/2024, 4:32 PM  Clinical Narrative:                 Wife at bedside. Patient has oxygen via [place]. Patient states that he was independent prior to admission. Vascular may do surgery for leaking valve. Wife plan to take patient home at time of d/c pending on mobility she would like HH.   Expected Discharge Plan: Home w Home Health Services     Patient Goals and CMS Choice            Expected Discharge Plan and Services       Living arrangements for the past 2 months: Single Family Home                                      Prior Living Arrangements/Services Living arrangements for the past 2 months: Single Family Home Lives with:: Spouse   Do you feel safe going back to the place where you live?: Yes          Current home services: DME (cvane, grab bars in shower)    Activities of Daily Living      Permission Sought/Granted                  Emotional Assessment Appearance:: Appears stated age   Affect (typically observed): Appropriate, Calm Orientation: : Oriented to Self, Oriented to Place, Oriented to  Time, Oriented to Situation      Admission diagnosis:  Hypertensive crisis [I16.9] Epigastric pain [R10.13] Abdominal aneurysm (HCC) [I71.40] Acute renal insufficiency [N28.9] Anticoagulated [Z79.01] S/P AAA repair [S01.109, Z86.79] Major hemoptysis [R04.2] 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] Patient Active Problem List   Diagnosis Date Noted   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   Epigastric pain 04/16/2024   Hematemesis without nausea 04/16/2024   Acute renal insufficiency 04/16/2024   S/P AAA repair 04/16/2024   Major hemoptysis 04/16/2024   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    Recurrent right inguinal hernia 11/17/2022   Left inguinal hernia 11/17/2022   S/P bilateral inguinal hernia repair 11/17/2022   Postoperative nausea and vomiting 11/17/2022   Osteoarthrosis involving lower leg 09/11/2019   Pure hypercholesterolemia 09/11/2019   Raynaud disease 12/19/2018   Ulcer of finger (HCC) 12/17/2018   Abdominal aneurysm (HCC) 04/03/2017   Ventral incisional hernia 10/24/2014   Duodenal ulcer 08/18/2014   Impaired glucose tolerance 08/18/2014   Epididymitis 03/10/2014   Pyelonephritis 02/24/2014   Perforated gastric ulcer (HCC) 09/20/2013   Renal abscess 08/20/2013   Acute respiratory failure with hypoxia (HCC) 08/20/2013   Dehydration 08/20/2013   Bacteremia due to ESBL Escherichia coli 08/20/2013   Severe sepsis (HCC) 08/18/2013   Acute renal failure (HCC) 08/18/2013   Hypotension, unspecified 08/18/2013   Hydronephrosis, left 08/18/2013   Nephrolithiasis 08/18/2013   Hematuria 08/08/2013   History of bladder cancer 11/20/2012   Hypertrophy of prostate with urinary obstruction and other lower urinary tract symptoms (LUTS) 11/20/2012   Chronic kidney disease, stage III (moderate) (HCC) 11/05/2012   Increased frequency of urination 11/05/2012   Urinary urgency 11/05/2012   PAD (peripheral artery disease) (HCC) 08/29/2011   Hypertensive crisis 08/29/2011  Abnormal electrocardiogram 08/29/2011   Preoperative cardiovascular examination 08/29/2011   Hyperlipidemia 08/29/2011   Malaise 08/29/2011   PCP:  Jeffie Cheryl BRAVO, MD Pharmacy:   [facility] - [place], [place] - [address] [facility] [address] Portal to Registered [facility] [address] [place] [address] Phone: [phone] Fax: [phone]  [id number] [place], [place] - [address] [place] [address] Phone: [phone] Fax: [phone]  [facility]) - [place], [place] - [address] [facility] [address] [place] [address] [place] [address] Phone: [phone] Fax: [phone]     Social Drivers of Health (SDOH) Social History: SDOH Screenings   Food Insecurity: No Food Insecurity (04/16/2024)  Housing: Low Risk  (04/16/2024)  Transportation Needs: No Transportation Needs (04/16/2024)  Utilities: Not At Risk (04/16/2024)  Financial Resource Strain: Low Risk  (07/19/2023)   Received from [facility]  Tobacco Use: High Risk (04/15/2024)   SDOH Interventions:     Readmission Risk Interventions     No data to display

## 2024-04-16 NOTE — Op Note (Signed)
 [facility]astroenterology Patient Name: Nathan Rivera Procedure Date: 04/16/2024 12:50 PM MRN: [id number] Account #: [id number] Date of Birth: November 14, 1945 Admit Type: Inpatient Age: 78 Room: [facility] ENDO ROOM 4 Gender: Male Note Status: Finalized Instrument Name: Upper Endoscope [id number] Procedure:             Upper GI endoscopy Indications:           Hematemesis Providers:             [name] MD, MD Referring MD:          No Local Md, MD (Referring MD) Medicines:             Propofol  per Anesthesia Complications:         No immediate complications. Procedure:             Pre-Anesthesia Assessment:                        - Prior to the procedure, a History and Physical was                         performed, and patient medications and allergies were                         reviewed. The patient's tolerance of previous                         anesthesia was also reviewed. The risks and benefits                         of the procedure and the sedation options and risks                         were discussed with the patient. All questions were                         answered, and informed consent was obtained. Prior                         Anticoagulants: The patient has taken no anticoagulant                         or antiplatelet agents. ASA Grade Assessment: III - A                         patient with severe systemic disease. After reviewing                         the risks and benefits, the patient was deemed in                         satisfactory condition to undergo the procedure.                        After obtaining informed consent, the endoscope was                         passed under direct vision. Throughout the procedure,  the patient's blood pressure, pulse, and oxygen                         saturations were monitored continuously. The Endoscope                         was introduced through the mouth, and advanced to  the                         second part of duodenum. The upper GI endoscopy was                         accomplished without difficulty. The patient tolerated                         the procedure well. Findings:      The Z-line was irregular.      A few semi-pedunculated polyps with no bleeding and no stigmata of       recent bleeding were found in the gastric fundus.      The examined duodenum was normal. Impression:            - Z-line irregular.                        - A few gastric polyps.                        - Normal examined duodenum.                        - No specimens collected. Recommendation:        - Return patient to ICU for ongoing care. Procedure Code(s):     --- Professional ---                        [phone], Esophagogastroduodenoscopy, flexible,                         transoral; diagnostic, including collection of                         specimen(s) by brushing or washing, when performed                         (separate procedure) Diagnosis Code(s):     --- Professional ---                        K92.0, Hematemesis                        K22.89, Other specified disease of esophagus CPT copyright 2022 American Medical Association. All rights reserved. The codes documented in this report are preliminary and upon coder review may  be revised to meet current compliance requirements. [name] MD, MD 04/16/2024 1:03:10 PM This report has been signed electronically. Number of Addenda: 0 Note Initiated On: 04/16/2024 12:50 PM Estimated Blood Loss:  Estimated blood loss: none.      [facility]

## 2024-04-16 NOTE — Consult Note (Signed)
 Hospital Consult    Reason for Consult:  AAA  Endoleak Type 1 Requesting Physician:  Dr Belva November MD  MRN #:  [id number]  History of Present Illness: This is a 78 y.o. male  with significant PMH of Infrarenal abdominal aortic aneurysm s/p endovascular repair of a ruptured AAA in 2011 on Xarelto , PAD, HTN, duodenal ulcer with perforation s/p omental patch repair, CKD stage III, thrombocytopenia, HDL, prior tobacco use, ESBL bacteremia, bladder cancer, chronic back pain, and bilateral inguinal hernia s/p repair who presented to the ED with chief complaints of abdominal pain and hemoptysis.   Past Medical History:  Diagnosis Date   Alzheimer dementia [facility])    Aortic aneurysm (HCC)    Arthritis    knees are the biggest problem   Cancer [facility]) 2012   bladder   CKD (chronic kidney disease), stage III (HCC) 2014   relative to infection, septic- renal calculi   Degenerative disc disease    Degenerative joint disease    GERD (gastroesophageal reflux disease)    History of kidney stones    Hypercholesterolemia    Hypertension    no meds currently   Impaired glucose tolerance    Muscle fatigue    muscle fatigue relative to sepsis experience    Peripheral vascular disease (HCC)    Pneumonia    Raynaud's disease    Sepsis (HCC) 07/2013   Stroke (HCC)    Thromboembolism of left popilteal vein 2015   pt. unsure which leg, treating with Xarelto    UTI (urinary tract infection) 08/17/2013   Lt nephrostomy tube and stent placement    Past Surgical History:  Procedure Laterality Date   ABDOMINAL AORTIC ANEURYSM REPAIR  2011   CATARACT EXTRACTION W/PHACO Left 02/15/2021   Procedure: CATARACT EXTRACTION PHACO AND INTRAOCULAR LENS PLACEMENT (IOC) LEFT 5.59 00:44.0;  Surgeon: Myrna Adine Anes, MD;  Location: [facility];  Service: Ophthalmology;  Laterality: Left;   CATARACT EXTRACTION W/PHACO Right 03/08/2021   Procedure: CATARACT EXTRACTION PHACO AND INTRAOCULAR LENS PLACEMENT  (IOC) RIGHT 1.81 00:22.7;  Surgeon: Myrna Adine Anes, MD;  Location: [facility];  Service: Ophthalmology;  Laterality: Right;   HERNIA REPAIR     78mo old   INSERTION OF MESH N/A 10/24/2014   Procedure: INSERTION OF MESH;  Surgeon: Deward Null III, MD;  Location: [facility] OR;  Service: General;  Laterality: N/A;   LAPAROTOMY N/A 08/24/2013   Procedure: EXPLORATORY LAPAROTOMY;  Surgeon: Deward GORMAN Null DOUGLAS, MD;  Location: [facility] OR;  Service: General;  Laterality: N/A;  Gram patch closure   litrotripsy  09/24/2013   NEPHROSTOMY  Nov. 2014   nephrostomy removal  Dec. 2014   PERIPHERALLY INSERTED CENTRAL CATHETER INSERTION     TONSILLECTOMY     UMBILICAL HERNIA REPAIR     50 yr ago   VENTRAL HERNIA REPAIR N/A 10/24/2014   Procedure: LAPAROSCOPIC ASSISTED VENTRAL HERNIA REPAIR WITH MESH;  Surgeon: Deward Null III, MD;  Location: [facility] OR;  Service: General;  Laterality: N/A;   XI ROBOTIC ASSISTED INGUINAL HERNIA REPAIR WITH MESH Bilateral 11/17/2022   Procedure: XI ROBOTIC ASSISTED INGUINAL HERNIA REPAIR WITH MESH-Bilateral;  Surgeon: Desiderio Schanz, MD;  Location: [facility] ORS;  Service: General;  Laterality: Bilateral;    Allergies  Allergen Reactions   Duloxetine Dermatitis    Prior to Admission medications   Medication Sig Start Date End Date Taking? Authorizing Provider  acetaminophen  (TYLENOL ) 325 MG tablet Take 325 mg by mouth every 6 (six) hours as  needed for mild pain.   Yes [provider]  amLODipine  (NORVASC ) 5 MG tablet TAKE 1 TABLET BY MOUTH  DAILY 12/24/19  Yes Brown, Fallon E, NP  cholecalciferol (VITAMIN D) 1000 UNITS tablet Take 1,000 Units by mouth daily.   Yes [provider]  Cyanocobalamin (VITAMIN B-12 ER PO) Take by mouth.   Yes [provider]  donepezil  (ARICEPT ) 10 MG tablet Take 10 mg by mouth at bedtime.   Yes [provider]  FOLIC ACID PO Take by mouth.   Yes [provider]  gabapentin  (NEURONTIN ) 100 MG capsule 200 mg 2 (two)  times daily. 200 mg in am and 200 mg in PM 04/01/17  Yes [provider]  Lactobacillus ([employer] PROBIOTIC ACIDOPHILUS) 10 MG CAPS Take by mouth.   Yes [provider]  memantine (NAMENDA) 10 MG tablet TAKE 1 TABLET BY MOUTH TWICE DAILY FOR MEMORY LOSS 02/28/24  Yes [provider]  pantoprazole  (PROTONIX ) 40 MG tablet Take 40 mg by mouth at bedtime.  11/13/13  Yes [provider]  simvastatin  (ZOCOR ) 20 MG tablet Take 20 mg by mouth at bedtime.   Yes [provider]  vitamin B-12 (CYANOCOBALAMIN) 500 MCG tablet Take 500 mcg by mouth daily.   Yes [provider]  XARELTO  20 MG TABS tablet daily.  12/23/13  Yes [provider]  amoxicillin -clavulanate (AUGMENTIN ) 875-125 MG tablet :1 Tablet(s) By Mouth Every 12 Hours Patient not taking: Reported on 04/15/2024 02/20/24   [provider]  pregabalin (LYRICA) 100 MG capsule Take 100 mg by mouth 2 (two) times daily. Patient not taking: Reported on 04/15/2024 12/20/23   [provider]    Social History   Socioeconomic History   Marital status: Married    Spouse name: [name]   Number of children: Not on file   Years of education: Not on file   Highest education level: Not on file  Occupational History   Not on file  Tobacco Use   Smoking status: Every Day    Current packs/day: 1.00    Average packs/day: 1 pack/day for 40.0 years (40.0 ttl pk-yrs)    Types: Cigarettes    Passive exposure: Past   Smokeless tobacco: Never   Tobacco comments:    since age 57  Vaping Use   Vaping status: Never Used  Substance and Sexual Activity   Alcohol use: Yes    Alcohol/week: 3.0 standard drinks of alcohol    Types: 3 Glasses of wine per week   Drug use: No   Sexual activity: Not on file  Other Topics Concern   Not on file  Social History Narrative   Not on file   Social Drivers of Health   Financial Resource Strain: Low Risk  (07/19/2023)   Received from [facility]   Overall Financial Resource Strain (CARDIA)    Difficulty of Paying Living Expenses: Not hard at all  Food Insecurity: No Food Insecurity (04/16/2024)   Hunger Vital Sign    Worried About Running Out of Food in the Last Year: Never true    Ran Out of Food in the Last Year: Never true  Transportation Needs: No Transportation Needs (04/16/2024)   PRAPARE - Administrator, Civil Service (Medical): No    Lack of Transportation (Non-Medical): No  Physical Activity: Not on file  Stress: Not on file  Social Connections: Not on file  Intimate Partner Violence: Not At Risk (04/16/2024)   Humiliation,  Afraid, Rape, and Kick questionnaire    Fear of Current or Ex-Partner: No    Emotionally Abused: No    Physically Abused: No    Sexually Abused: No     Family History  Problem Relation Age of Onset   CVA Father    Atrial fibrillation Sister     ROS: Otherwise negative unless mentioned in HPI  Physical Examination  Vitals:   04/16/24 0830 04/16/24 0845  BP: 139/77 128/67  Pulse: (!) 53 (!) 41  Resp: 14 14  Temp:    SpO2: 94% 96%   Body mass index is 30.64 kg/m.  General:  WDWN in NAD Gait: Not observed HENT: WNL, normocephalic Pulmonary: normal non-labored breathing, without Rales, rhonchi,  wheezing Cardiac: regular, Bradycardia in the 40's., without  Murmurs, rubs or gallops; without carotid bruits Abdomen: positive bowel sounds throughout, soft, NT/ND, no masses Skin: without rashes Vascular Exam/Pulses: Palpable pulses throughout, all extremities are warm to the touch.  Extremities: without ischemic changes, without Gangrene , without cellulitis; without open wounds;  Musculoskeletal: no muscle wasting or atrophy  Neurologic: A&O X 3;  No focal weakness or paresthesias are detected; speech is fluent/normal Psychiatric:  The pt has Normal affect. Lymph:  Unremarkable  CBC    Component Value Date/Time   WBC 10.2 04/16/2024 0339   RBC 4.82  04/16/2024 0339   HGB 14.5 04/16/2024 0339   HGB 10.3 (L) 08/18/2013 0504   HCT 44.0 04/16/2024 0339   HCT 30.9 (L) 08/18/2013 0504   PLT 165 04/16/2024 0339   PLT 161 08/18/2013 0504   MCV 91.3 04/16/2024 0339   MCV 86 08/18/2013 0504   MCH 30.1 04/16/2024 0339   MCHC 33.0 04/16/2024 0339   RDW 15.1 04/16/2024 0339   RDW 16.0 (H) 08/18/2013 0504   LYMPHSABS 2.8 08/24/2013 0701   LYMPHSABS 2.3 08/18/2013 0504   MONOABS 0.8 08/24/2013 0701   MONOABS 1.1 (H) 08/18/2013 0504   EOSABS 0.0 08/24/2013 0701   EOSABS 0.2 08/18/2013 0504   BASOSABS 0.0 08/24/2013 0701   BASOSABS 0.0 08/18/2013 0504    BMET    Component Value Date/Time   NA 142 04/16/2024 0339   NA 144 10/23/2014 1630   K 4.2 04/16/2024 0339   K 4.1 10/23/2014 1630   CL 108 04/16/2024 0339   CL 112 (H) 10/23/2014 1630   CO2 24 04/16/2024 0339   CO2 26 10/23/2014 1630   GLUCOSE 96 04/16/2024 0339   GLUCOSE 83 10/23/2014 1630   BUN 43 (H) 04/16/2024 0339   BUN 30 (H) 10/23/2014 1630   CREATININE 2.44 (H) 04/16/2024 0339   CREATININE 1.96 (H) 10/23/2014 1630   CALCIUM 8.8 (L) 04/16/2024 0339   CALCIUM 9.0 10/23/2014 1630   GFRNONAA 26 (L) 04/16/2024 0339   GFRNONAA 36 (L) 10/23/2014 1630   GFRNONAA 8 (L) 08/18/2013 0504   GFRAA 39 (L) 10/25/2014 0352   GFRAA 44 (L) 10/23/2014 1630   GFRAA 9 (L) 08/18/2013 0504    COAGS: Lab Results  Component Value Date   INR 1.3 (H) 04/15/2024   INR 1.5 10/20/2014   INR 1.16 08/18/2013     Non-Invasive Vascular Imaging:   EXAM:04/15/24 CT ANGIOGRAPHY CHEST, ABDOMEN AND PELVIS   TECHNIQUE: Non-contrast CT of the chest was initially obtained.   Multidetector CT imaging through the chest, abdomen and pelvis was performed using the standard protocol during bolus administration of intravenous contrast. Multiplanar reconstructed images and MIPs were obtained and reviewed to evaluate the vascular  anatomy.   RADIATION DOSE REDUCTION: This exam was performed according  to the departmental dose-optimization program which includes automated exposure control, adjustment of the mA and/or kV according to patient size and/or use of iterative reconstruction technique.   CONTRAST:  80mL OMNIPAQUE  IOHEXOL  350 MG/ML SOLN   COMPARISON:  CT 10/20/2022   FINDINGS: CTA CHEST FINDINGS   Cardiovascular: Non contrasted images of the chest demonstrate no acute intramural hematoma.   Negative for aortic dissection. Focal outpouching measuring 14 mm at the distal arch either representing prominent ductus diverticulum or aneurysm, but sludge least stable size compared with noncontrast CT from 2015. No extravasation identified within the chest. Coronary vascular calcification. Normal cardiac size. No pericardial effusion.   Mediastinum/Nodes: Patent trachea. No thyroid mass. No suspicious lymph nodes. Esophagus within normal limits. Calcified nodes consistent with prior granulomatous disease.   Lungs/Pleura: No pleural effusion or pneumothorax. Patchy mild peribronchovascular ground-glass disease. No consolidation.   Musculoskeletal: Sternum appears intact. No acute osseous abnormality.   Review of the MIP images confirms the above findings.   CTA ABDOMEN AND PELVIS FINDINGS   VASCULAR   Aorta: Aortic atherosclerosis. Status post infrarenal abdominal aortoiliac stent graft. The proximal end of the stent is just below the level of the renal arteries. Aneurysmal dilatation of the abdominal aorta at the level of the renal arteries, slightly superior to the proximal end of the stent, this measures 4.6 x 3.7 cm on axial series 6, image 180, previously 4.2 by 3.2 cm. Saccular appearing component of the aneurysm at the level of the stent, coronal series 11, image 74, this appears increased in size compared to the non contrasted exam from January and there may be some soft tissue thickening along the saccular portion of the aneurysm on coronal series 11, image  73. No dissection is seen.   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 Possible small 5 mm aneurysm at the origin of the celiac artery, series 12, image 108.   SMA: Mild-to-moderate stenosis of the proximal SMA just distal to the origin, series 12, image 111. Distal vessel patency without dissection.   Renals: Accessory upper pole right renal artery with severe stenosis at the origin, series 11, image 80. 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 String like flow at the proximal right renal artery, series 11 image 75, consistent with severe stenosis over a 12 mm length.   Small accessory upper pole renal artery on the left with suspected severe stenosis at the origin. Dominant left renal artery with severe stenosis at the origin, series 6, image 172 and coronal series 11 image 74 and 75.   IMA: Poorly visible, small vessel supplying the perirectal is traceable to the iliac stent supplying the left iliac vessel.   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 Aneurysmal dilatation of the right common iliac artery at the distal end of the stent measuring up to 2.8 cm on series 11, image 73. Multiple small aneurysms of the proximal internal and iliac vessels. No high-grade stenosis or occlusive disease of the distal external iliac vessels.   Veins: Suboptimally assessed   Review of the MIP images confirms the above findings.   NON-VASCULAR   Hepatobiliary: Small gallstone. No biliary dilatation. No focal hepatic abnormality.   Pancreas: Unremarkable. No pancreatic ductal dilatation or surrounding inflammatory changes.   Spleen: Multiple  calcified granuloma   Adrenals/Urinary Tract: Adrenal glands are within normal limits. Kidneys show no hydronephrosis. Multiple renal cysts for which no imaging  follow-up is recommended. Slightly thick-walled urinary bladder. Indeterminate slightly dense exophytic lesion off the upper pole right kidney measuring 17 mm, series 6, image 168.   Stomach/Bowel: The stomach is non enlarged. There is no dilated small bowel. No acute bowel wall thickening. Negative appendix. Diverticular disease of the colon.   Lymphatic: No suspicious lymph nodes.   Reproductive: Negative prostate   Other: Negative for pelvic effusion or free air. Previously noted bilateral inguinal hernias are resolved/repair.   Musculoskeletal: No acute or suspicious osseous abnormality.   Review of the MIP images confirms the above findings.   IMPRESSION: 1. Negative for acute aortic dissection or intramural hematoma. No extravasation identified within the chest, abdomen, or pelvis. 2. Status post infrarenal abdominal aortoiliac stent graft. Aneurysmal dilatation of the abdominal aorta at the level of the renal arteries, slightly superior to the proximal end of the stent, this measures 4.6 x 3.7 cm, previously 4.2 x 3.2 cm. Saccular appearing component of the aneurysm at the level of the proximal stent appears increased in size compared to the non contrasted exam from January and there may be some soft tissue thickening and stranding along the inferior aspect of the saccular part of the aneurysm raising concern for possible leakage or impending rupture, vascular surgery consultation is recommended. 3. 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 Aneurysmal dilatation of the right common iliac artery at the distal end of the stent measuring up to 2.8 cm. Multiple small aneurysms of the proximal internal and iliac vessels. 4. 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 There are small accessory upper pole renal arteries bilaterally with suspected severe stenosis at the origin  of these vessels. 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 5. 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 Possible small 5 mm aneurysm at the origin of the celiac artery. 6. 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 Patchy mild peribronchovascular ground-glass disease within the lungs, suggestive of respiratory infection. No consolidative pneumonia. 8. Cholelithiasis. Diverticular disease of the colon. 9. 16 mm indeterminate exophytic lesion off the upper pole right kidney, could initially correlate with ultrasound 10. Slightly thick-walled urinary bladder, correlate for cystitis. 11. Interval bilateral inguinal hernia repair. 12. Aortic atherosclerosis.  Statin:  Yes.   Beta Blocker:  No. Aspirin:  No. ACEI:  No. ARB:  No. CCB use:  Yes Other antiplatelets/anticoagulants:  Yes.   Xarelto  20 mg daily   ASSESSMENT/PLAN: This is a 78 y.o. male who presents to [facility] emergency department with chief complaint of abdominal pain and hemoptysis. Upon workup patient underwent CTA of chest abdomen and pelvis. 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 He is also noted to have an aneurysm involving the left common iliac artery which partially encompasses the left iliac stent, measuring up to 3.8 cm. There is also aneurysmal dilatation of the right common iliac artery at the distal end of the stent measuring 2.8 cm. There is severe stenosis at the origin of both dominant renal arteries.   PLAN Patient will need to stabilized and hemoptysis controlled prior to vascular intervention to repair the AAA Type 1 Endovascular leak and  bilateral iliac aneurysms. Patient will require ASA alone or possible DAPT or Eliquis post procedure. Vascular Surgery can schedule repair for tomorrow Wednesday 04/17/24 pending Gastroenterology's plan for patients hemoptysis.   I  did have a conversation with the patient, his wife and daughter at the bedside earlier this morning about the patient's current condition and need for abdominal aortic aneurysm leak and iliac aneurysm repair.  They were informed we will wait for gastroenterology's input prior to scheduling any vascular procedure at this time.  They all verbalized understanding.  - I discussed the case in detail with Dr. Selinda Gu MD and he agrees with the plan.   [name] [facility] 04/16/2024 9:00 AM

## 2024-04-16 NOTE — Progress Notes (Signed)
 NAME:  Nathan Rivera, MRN:  [id number], DOB:  10/28/1945, LOS: 1 ADMISSION DATE:  04/15/2024, CHIEF COMPLAINT:  Hemoptysis   History of Present Illness:   78 y.o male with significant PMH of Infrarenal abdominal aortic aneurysm s/p endovascular repair of a ruptured AAA in 2011 on Xarelto , PAD, HTN, duodenal ulcer with perforation s/p omental patch repair, CKD stage III, thrombocytopenia, HDL, prior tobacco use, ESBL bacteremia, bladder cancer, chronic back pain, and bilateral inguinal hernia s/p repair who presented to the ED with chief complaints of abdominal pain and hemoptysis.   Patient and family at bedside reports that he took his last dose of Xarelto  yesterday evening.  He subsequently developed some abdominal pain around the epigastric and left upper quadrant region.  Since onset of symptoms has been coughing up bright red blood which he describes  blood filling up his mouth.  Denies other associated symptoms   ED Course: Initial vital signs showed HR of 68 beats/minute, BP 192/102 mm Hg, the RR 18 breaths/minute, and the oxygen saturation 93% on RA and a temperature of 98.48F (36.7C). Pertinent Labs/Diagnostics Findings: Na+/ K+:140/4.2 Glucose:94  BUN/Cr.:39/2.43 TAR:Lwmzfjmxjaoz  PCT: negative <0.10  BNP: 394.5  CTA Chest> CT Abd/pelvis> Saccular aneurysm at the level of the proximal stent with some soft tissue thickening and stranding concerning possible endoleak or impending rupture,Large aneurysm involving the left common iliac artery, partially encompasses the left iliac stent, Aneurysmal dilatation of the right common iliac artery at the distal end of the stent  Multiple small aneurysms of the proximal internal and iliac vessels.    CT findings discussed with on-call vascular Dr. Marea who recommended hemodynamic monitoring as it is unlikely patient will require emergent intervention but may require repair during the course of admission. PCCM consulted for admission, BP management  and close monitoring.  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 Aorto bifemoral endograft placement for AAA,  Chronic right-sided low back pain without sciatica;  Thrombocytopenia  Raynaud's disease with gangrene  B12 deficiency;  Personal history of malignant neoplasm of bladder;  Tobacco use disorder;  Hematuria, gross  ESBL Bacteremia due to septic shock due to obstructive stone with Pyelonephritis bilateral Inguinal Hernia s/p repair (02/24)  Significant Hospital Events: Including procedures, antibiotic start and stop dates in addition to other pertinent events   7/21: admit to ICU, on clevidipine  and esmolol  7/22: no recurrence of hemoptysis, but he continues to have a cough. EGD normal without any PUD or sign of GI bleed  Interim History / Subjective:  Feels well, reports cough, wife updated at the bedside.  Objective    Blood pressure (!) 128/58, pulse (!) 47, temperature 97.7 F (36.5 C), temperature source Oral, resp. rate 12, height 5' 9 (1.753 m), weight 94.1 kg, SpO2 95%.        Intake/Output Summary (Last 24 hours) at 04/16/2024 1459 Last data filed at 04/16/2024 1304 Gross per 24 hour  Intake 1067.74 ml  Output 0 ml  Net 1067.74 ml   Filed Weights   04/15/24 1430 04/15/24 2158 04/16/24 0500  Weight: 99.8 kg 94.1 kg 94.1 kg    Examination: Physical Exam Constitutional:      Appearance: He is not ill-appearing.  Cardiovascular:     Rate and Rhythm: Normal rate and regular rhythm.     Pulses: Normal  pulses.     Heart sounds: Normal heart sounds.  Pulmonary:     Effort: Pulmonary effort is normal.     Breath sounds: Normal breath sounds. No rales.  Neurological:     General: No focal deficit present.     Mental Status: He is alert and  oriented to person, place, and time. Mental status is at baseline.     Assessment and Plan   #Hemoptysis #Endoleak with Aneurysm Expansion #History of Infrarenal AAA s/p endovascular repeair #HTN emergency #AKI on CKD #Community Acquired Pneumonia #Mild Cognitive Impairment  78 year old male presenting with an episode of hemoptysis (golf ball sized) with concern for hematemesis given history of GI bleed in the past. Patient was found to have a possible endoleak with threat of rupture and is admitted to the ICU for tight blood pressure control.  Neuro - history of mild cognitive impairment, holding memantine and donepezil  given bradycardia with esmolol . CV - history of hypertensive, and presented with elevated blood pressure. Given concern for endovascular leak and risk of AAA rupture, we have started him on strict BP and heart rate control with esmolol  (goal HR < 60) and clevidipine  (SBP 100-120). Consulted with vascular surgery for evaluation for endovascular repair. Pulm - I have personally reviewed his chest CT and note multiple tree in bud nodules as well as minimal ground glass opacities that were scattered, concerning for viral infection versus bacterial pneumonia/bronchitis. He is on room air. Started antibiotics and will send a viral panel. GI - NPO for procedure. Underwent EGD that did not show any PUD. Will discontinue high dose PPI Renal - AKI on CKD, which is likely exacerbated by contrast exposure. Will initiate gentle IV fluids and hold nephrotoxins. Endo - ICU glycemic protocol Hem/Onc - on home rivaroxaban , holding given hemoptysis. H/H stable ID - will check viral panel and initiate ceftriaxone  and azithromycin  for CAP coverage. Will attempt to send sputum for culture.  Best Practice (right click and Reselect all SmartList Selections daily)   Diet/type: NPO DVT prophylaxis not indicated Pressure ulcer(s): N/A GI prophylaxis: N/A Lines: N/A Foley:  N/A Code Status:   full code Last date of multidisciplinary goals of care discussion [04/16/2024]  Labs   CBC: Recent Labs  Lab 04/15/24 1447 04/16/24 0339  WBC 9.1 10.2  HGB 15.3 14.5  HCT 47.3 44.0  MCV 91.7 91.3  PLT 160 165    Basic Metabolic Panel: Recent Labs  Lab 04/15/24 1447 04/16/24 0339  NA 140 142  K 4.2 4.2  CL 105 108  CO2 24 24  GLUCOSE 94 96  BUN 39* 43*  CREATININE 2.43* 2.44*  CALCIUM 9.4 8.8*  MG  --  2.3  PHOS  --  3.9   GFR: Estimated Creatinine Clearance: 28.3 mL/min (A) (by C-G formula based on SCr of 2.44 mg/dL (H)). Recent Labs  Lab 04/15/24 1447 04/15/24 2030 04/15/24 2218 04/16/24 0339  PROCALCITON  --  <0.10  --  <0.10  WBC 9.1  --   --  10.2  LATICACIDVEN  --  0.9 1.2  --     Liver Function Tests: No results for input(s): AST, ALT, ALKPHOS, BILITOT, PROT, ALBUMIN  in the last 168 hours. No results for input(s): LIPASE, AMYLASE in the last 168 hours. No results for input(s): AMMONIA in the last 168 hours.  ABG    Component Value Date/Time   PHART 7.273 (L) 08/24/2013 2337   PCO2ART 28.9 (L) 08/24/2013 2337   PO2ART 84.0 08/24/2013 2337  HCO3 13.4 (L) 08/24/2013 2337   TCO2 14 08/24/2013 2337   ACIDBASEDEF 12.0 (H) 08/24/2013 2337   O2SAT 95.0 08/24/2013 2337     Coagulation Profile: Recent Labs  Lab 04/15/24 1806  INR 1.3*    Cardiac Enzymes: No results for input(s): CKTOTAL, CKMB, CKMBINDEX, TROPONINI in the last 168 hours.  HbA1C: No results found for: HGBA1C  CBG: Recent Labs  Lab 04/15/24 2153  GLUCAP 100*    Review of Systems:   N/A  Past Medical History:  He,  has a past medical history of Alzheimer dementia (HCC), Aortic aneurysm (HCC), Arthritis, Cancer (HCC) (2012), CKD (chronic kidney disease), stage III (HCC) (2014), Degenerative disc disease, Degenerative joint disease, GERD (gastroesophageal reflux disease), History of kidney stones, Hypercholesterolemia, Hypertension, Impaired  glucose tolerance, Muscle fatigue, Peripheral vascular disease (HCC), Pneumonia, Raynaud's disease, Sepsis (HCC) (07/2013), Stroke [facility]), Thromboembolism of left popilteal vein (2015), and UTI (urinary tract infection) (08/17/2013).   Surgical History:   Past Surgical History:  Procedure Laterality Date   ABDOMINAL AORTIC ANEURYSM REPAIR  2011   CATARACT EXTRACTION W/PHACO Left 02/15/2021   Procedure: CATARACT EXTRACTION PHACO AND INTRAOCULAR LENS PLACEMENT (IOC) LEFT 5.59 00:44.0;  Surgeon: Myrna Adine Anes, MD;  Location: [facility];  Service: Ophthalmology;  Laterality: Left;   CATARACT EXTRACTION W/PHACO Right 03/08/2021   Procedure: CATARACT EXTRACTION PHACO AND INTRAOCULAR LENS PLACEMENT (IOC) RIGHT 1.81 00:22.7;  Surgeon: Myrna Adine Anes, MD;  Location: [facility];  Service: Ophthalmology;  Laterality: Right;   HERNIA REPAIR     51mo old   INSERTION OF MESH N/A 10/24/2014   Procedure: INSERTION OF MESH;  Surgeon: Deward Null III, MD;  Location: [facility] OR;  Service: General;  Laterality: N/A;   LAPAROTOMY N/A 08/24/2013   Procedure: EXPLORATORY LAPAROTOMY;  Surgeon: Deward GORMAN Null DOUGLAS, MD;  Location: [facility];  Service: General;  Laterality: N/A;  Gram patch closure   litrotripsy  09/24/2013   NEPHROSTOMY  Nov. 2014   nephrostomy removal  Dec. 2014   PERIPHERALLY INSERTED CENTRAL CATHETER INSERTION     TONSILLECTOMY     UMBILICAL HERNIA REPAIR     50 yr ago   VENTRAL HERNIA REPAIR N/A 10/24/2014   Procedure: LAPAROSCOPIC ASSISTED VENTRAL HERNIA REPAIR WITH MESH;  Surgeon: Deward Null III, MD;  Location: [facility] OR;  Service: General;  Laterality: N/A;   XI ROBOTIC ASSISTED INGUINAL HERNIA REPAIR WITH MESH Bilateral 11/17/2022   Procedure: XI ROBOTIC ASSISTED INGUINAL HERNIA REPAIR WITH MESH-Bilateral;  Surgeon: Desiderio Schanz, MD;  Location: [facility] ORS;  Service: General;  Laterality: Bilateral;     Social History:   reports that he has been smoking cigarettes. He has a 40 pack-year  smoking history. He has been exposed to tobacco smoke. He has never used smokeless tobacco. He reports current alcohol use of about 3.0 standard drinks of alcohol per week. He reports that he does not use drugs.   Family History:  His family history includes Atrial fibrillation in his sister; CVA in his father.   Allergies Allergies  Allergen Reactions   Duloxetine Dermatitis     Home Medications  Prior to Admission medications   Medication Sig Start Date End Date Taking? Authorizing Provider  acetaminophen  (TYLENOL ) 325 MG tablet Take 325 mg by mouth every 6 (six) hours as needed for mild pain.   Yes [provider]  amLODipine  (NORVASC ) 5 MG tablet TAKE 1 TABLET BY MOUTH  DAILY 12/24/19  Yes Brown, Fallon E, NP  cholecalciferol (  VITAMIN D) 1000 UNITS tablet Take 1,000 Units by mouth daily.   Yes [provider]  Cyanocobalamin (VITAMIN B-12 ER PO) Take by mouth.   Yes [provider]  donepezil  (ARICEPT ) 10 MG tablet Take 10 mg by mouth at bedtime.   Yes [provider]  FOLIC ACID PO Take by mouth.   Yes [provider]  gabapentin  (NEURONTIN ) 100 MG capsule 200 mg 2 (two) times daily. 200 mg in am and 200 mg in PM 04/01/17  Yes [provider]  Lactobacillus ([employer] PROBIOTIC ACIDOPHILUS) 10 MG CAPS Take by mouth.   Yes [provider]  memantine (NAMENDA) 10 MG tablet TAKE 1 TABLET BY MOUTH TWICE DAILY FOR MEMORY LOSS 02/28/24  Yes [provider]  pantoprazole  (PROTONIX ) 40 MG tablet Take 40 mg by mouth at bedtime.  11/13/13  Yes [provider]  simvastatin  (ZOCOR ) 20 MG tablet Take 20 mg by mouth at bedtime.   Yes [provider]  vitamin B-12 (CYANOCOBALAMIN) 500 MCG tablet Take 500 mcg by mouth daily.   Yes [provider]  XARELTO  20 MG TABS tablet daily.  12/23/13  Yes [provider]  amoxicillin -clavulanate (AUGMENTIN ) 875-125 MG tablet :1 Tablet(s) By Mouth Every 12  Hours Patient not taking: Reported on 04/15/2024 02/20/24   [provider]  pregabalin (LYRICA) 100 MG capsule Take 100 mg by mouth 2 (two) times daily. Patient not taking: Reported on 04/15/2024 12/20/23   [provider]     The patient is critically ill due to AAA, hypertensive emergency, hemoptysis.  Critical care was necessary to treat or prevent imminent or life-threatening deterioration. Critical care time was spent by me on the following activities: development of a treatment plan with the patient and/or surrogate as well as nursing, discussions with consultants, evaluation of the patient's response to treatment, examination of the patient, obtaining a history from the patient or surrogate, ordering and performing treatments and interventions, ordering and review of laboratory studies, ordering and review of radiographic studies, review of telemetry data including pulse oximetry, re-evaluation of patient's condition and participation in multidisciplinary rounds.   I personally spent 52 minutes providing critical care not including any separately billable procedures.   Belva November, MD [facility] 04/16/2024 3:18 PM

## 2024-04-16 NOTE — H&P (Incomplete)
 NAME:  Nathan Rivera, MRN:  [id number], DOB:  1946-08-28, LOS: 0 ADMISSION DATE:  04/15/2024, CONSULTATION DATE:  *** REFERRING MD:  ***, CHIEF COMPLAINT:  ***    HPI  [age] y.o with significant PMH of who presented to the ED with chief complaints of    ED Course: Initial vital signs showed HR of beats/minute, BP mm Hg, the RR 30 breaths/minute, and the oxygen saturation % on and a temperature of 98.81F (36.9C). Pertinent Labs/Diagnostics Findings: Na+/ K+:  Glucose: BUN/Cr.:Calcium:  AST/ALT: WBC: K/L without bands or neutrophil predominance   Hgb/Hct: Plts:  PCT: negative <0.10  Lactic acid:  COVID PCR: Negative,  troponin:   BNP:   ABG: pO2 ***; pCO2 ***; pH ***;  HCO3 ***, %O2 Sat ***.  CXR> CTH> CTA Chest> CT Abd/pelvis> Medication administered in the ED: Disposition:  Past Medical History  Pure hypercholesterolemia  CKD stage III Duodenal ulcer; Perforated gastric ulcer s/p omental patch repair Peripheral vascular disease  Infrarenal abdominal aortic aneurysm s/p endovascular repair of a ruptured AAA in 2011. Aorto bifemoral endograft placement for AAA,  Chronic right-sided low back pain without sciatica;  Thrombocytopenia  Raynaud's disease with gangrene  B12 deficiency;  Personal history of malignant neoplasm of bladder;  Tobacco use disorder;  Hematuria, gross  ESBL Bacteremia due to septic shock due to obstructive stone with Pyelonephritis bilateral Inguinal Hernia s/p repair (02/24)  Significant Hospital Events   ***  Consults:  ***  Procedures:  ***  Interim History / Subjective:      Micro Data:  : SARS-CoV-2 PCR> negative : Influenza PCR> negative : Blood culture x2> : Urine Culture> : MRSA PCR>>  : Strep pneumo urinary antigen> : Legionella urinary antigen>  Antimicrobials:  Vancomycin  Cefepime  Azithromycin  Ceftriaxone  Metronidazole  OBJECTIVE  Blood pressure 131/69, pulse (!) 52, temperature 97.9 F (36.6 C), temperature source  Oral, resp. rate 17, height 5' 9 (1.753 m), weight 94.1 kg, SpO2 98%.       No intake or output data in the 24 hours ending 04/15/24 2341 Filed Weights   04/15/24 1430 04/15/24 2158  Weight: 99.8 kg 94.1 kg    Physical Examination  GENERAL: year-old critically ill patient lying in the bed  EYES: PEERLA. No scleral icterus. Extraocular muscles intact.  HEENT: Head atraumatic, normocephalic. Oropharynx and nasopharynx clear.  CARDIOVASCULAR:S1, S2 normal. No murmurs, rubs, or gallops.  Intermittently tachycardic PULMONARY: Lungs sounds ABDOMINAL: Soft, NTND MUSCULOSKELETAL: No swelling. Normal range of motion. 5/5 strength in all extremities.   NEUROLOGICAL: General: No focal deficit present. She is alert and oriented to person, place, and time baseline.  PSYCHIATRIC:  Mood and Affect: Mood normal.  SKIN:Skin is warm and dry. No obvious rash, lesion, or ulcer. Capillary Refill:< 2sec   Labs/imaging that I {ACTIONS; HAVE/HAVE NOT:19434}personally reviewed  (right click and Reselect all SmartList Selections daily)   CT Angio Chest/Abd/Pel for Dissection W and/or Wo Contrast Result Date: 04/15/2024 CLINICAL DATA:  Coughing up blood EXAM: CT ANGIOGRAPHY CHEST, ABDOMEN AND PELVIS TECHNIQUE: Non-contrast CT of the chest was initially obtained. Multidetector CT imaging through the chest, abdomen and pelvis was performed using the standard protocol during bolus administration of intravenous contrast. Multiplanar reconstructed images and MIPs were obtained and reviewed to evaluate the vascular anatomy. RADIATION DOSE REDUCTION: This exam was performed according to the departmental dose-optimization program which includes automated exposure control, adjustment of the mA and/or kV according to patient size and/or use of iterative reconstruction technique. CONTRAST:  80mL  OMNIPAQUE  IOHEXOL  350 MG/ML SOLN COMPARISON:  CT 10/20/2022 FINDINGS: CTA CHEST FINDINGS Cardiovascular: Non contrasted images of  the chest demonstrate no acute intramural hematoma. Negative for aortic dissection. Focal outpouching measuring 14 mm at the distal arch either representing prominent ductus diverticulum or aneurysm, but sludge least stable size compared with noncontrast CT from 2015. No extravasation identified within the chest. Coronary vascular calcification. Normal cardiac size. No pericardial effusion. Mediastinum/Nodes: Patent trachea. No thyroid mass. No suspicious lymph nodes. Esophagus within normal limits. Calcified nodes consistent with prior granulomatous disease. Lungs/Pleura: No pleural effusion or pneumothorax. Patchy mild peribronchovascular ground-glass disease. No consolidation. Musculoskeletal: Sternum appears intact. No acute osseous abnormality. Review of the MIP images confirms the above findings. CTA ABDOMEN AND PELVIS FINDINGS VASCULAR Aorta: Aortic atherosclerosis. Status post infrarenal abdominal aortoiliac stent graft. The proximal end of the stent is just below the level of the renal arteries. Aneurysmal dilatation of the abdominal aorta at the level of the renal arteries, slightly superior to the proximal end of the stent, this measures 4.6 x 3.7 cm on axial series 6, image 180, previously 4.2 by 3.2 cm. Saccular appearing component of the aneurysm at the level of the stent, coronal series 11, image 74, this appears increased in size compared to the non contrasted exam from January and there may be some soft tissue thickening along the saccular portion of the aneurysm on coronal series 11, image 73. No dissection is seen. 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 Possible small 5 mm aneurysm at the origin of the celiac artery, series 12, image 108. SMA: Mild-to-moderate stenosis of the proximal SMA just distal to the origin, series 12, image 111. Distal vessel patency without dissection. Renals: Accessory upper pole right renal artery with severe stenosis at the origin,  series 11, image 80. 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 String like flow at the proximal right renal artery, series 11 image 75, consistent with severe stenosis over a 12 mm length. Small accessory upper pole renal artery on the left with suspected severe stenosis at the origin. Dominant left renal artery with severe stenosis at the origin, series 6, image 172 and coronal series 11 image 74 and 75. IMA: Poorly visible, small vessel supplying the perirectal is traceable to the iliac stent supplying the left iliac vessel. 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 Aneurysmal dilatation of the right common iliac artery at the distal end of the stent measuring up to 2.8 cm on series 11, image 73. Multiple small aneurysms of the proximal internal and iliac vessels. No high-grade stenosis or occlusive disease of the distal external iliac vessels. Veins: Suboptimally assessed Review of the MIP images confirms the above findings. NON-VASCULAR Hepatobiliary: Small gallstone. No biliary dilatation. No focal hepatic abnormality. Pancreas: Unremarkable. No pancreatic ductal dilatation or surrounding inflammatory changes. Spleen: Multiple calcified granuloma Adrenals/Urinary Tract: Adrenal glands are within normal limits. Kidneys show no hydronephrosis. Multiple renal cysts for which no imaging follow-up is recommended. Slightly thick-walled urinary bladder. Indeterminate slightly dense exophytic lesion off the upper pole right kidney measuring 17 mm, series 6, image 168. Stomach/Bowel: The stomach is non enlarged. There is no dilated small bowel. No acute bowel wall thickening. Negative appendix. Diverticular disease of the colon. Lymphatic: No suspicious lymph nodes. Reproductive: Negative prostate Other: Negative for pelvic effusion or free air. Previously noted  bilateral  inguinal hernias are resolved/repair. Musculoskeletal: No acute or suspicious osseous abnormality. Review of the MIP images confirms the above findings. IMPRESSION: 1. Negative for acute aortic dissection or intramural hematoma. No extravasation identified within the chest, abdomen, or pelvis. 2. Status post infrarenal abdominal aortoiliac stent graft. Aneurysmal dilatation of the abdominal aorta at the level of the renal arteries, slightly superior to the proximal end of the stent, this measures 4.6 x 3.7 cm, previously 4.2 x 3.2 cm. Saccular appearing component of the aneurysm at the level of the proximal stent appears increased in size compared to the non contrasted exam from January and there may be some soft tissue thickening and stranding along the inferior aspect of the saccular part of the aneurysm raising concern for possible leakage or impending rupture, vascular surgery consultation is recommended. 3. 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 Aneurysmal dilatation of the right common iliac artery at the distal end of the stent measuring up to 2.8 cm. Multiple small aneurysms of the proximal internal and iliac vessels. 4. 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 There are small accessory upper pole renal arteries bilaterally with suspected severe stenosis at the origin of these vessels. 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 5. 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 Possible small 5 mm aneurysm at the origin of the celiac artery. 6. 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 Patchy mild peribronchovascular ground-glass disease within the lungs, suggestive of respiratory infection. No consolidative pneumonia. 8. Cholelithiasis. Diverticular disease of the colon. 9. 16 mm indeterminate exophytic lesion off the upper  pole right kidney, could initially correlate with ultrasound 10. Slightly thick-walled urinary bladder, correlate for cystitis. 11. Interval bilateral inguinal hernia repair. 12. Aortic atherosclerosis. Aortic Atherosclerosis (ICD10-I70.0). Electronically Signed   By: Luke Bun M.D.   On: 04/15/2024 19:47   DG Chest 2 View Result Date: 04/15/2024 CLINICAL DATA:  Hemoptysis since last night, worsening today. Increased lower extremity swelling for 1 week. EXAM: CHEST - 2 VIEW COMPARISON:  Radiographs 08/26/2013 and 08/24/2013. Chest CT 04/01/2014. FINDINGS: The lateral view was repeated. The heart size and mediastinal contours are normal. 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 The lungs appear clear. No pleural effusion or pneumothorax. Calcified splenic granulomas are noted. Possible old rib fractures bilaterally without acute osseous abnormality. IMPRESSION: No radiographic evidence of active cardiopulmonary process. If the patient has persistent unexplained hemoptysis, follow-up CT should be considered. Electronically Signed   By: Elsie Perone M.D.   On: 04/15/2024 15:17     Labs   CBC: Recent Labs  Lab 04/15/24 1447  WBC 9.1  HGB 15.3  HCT 47.3  MCV 91.7  PLT 160    Basic Metabolic Panel: Recent Labs  Lab 04/15/24 1447  NA 140  K 4.2  CL 105  CO2 24  GLUCOSE 94  BUN 39*  CREATININE 2.43*  CALCIUM 9.4   GFR: Estimated Creatinine Clearance: 28.4 mL/min (A) (by C-G formula based on SCr of 2.43 mg/dL (H)). Recent Labs  Lab 04/15/24 1447 04/15/24 2030 04/15/24 2218  PROCALCITON  --  <0.10  --   WBC 9.1  --   --   LATICACIDVEN  --  0.9 1.2    Liver Function Tests: No results for input(s): AST, ALT, ALKPHOS, BILITOT, PROT, ALBUMIN  in the last 168 hours.  No results for input(s): LIPASE, AMYLASE in the last 168 hours. No results for input(s): AMMONIA in the last 168 hours.  ABG    Component Value Date/Time   PHART 7.273 (L)  08/24/2013 2337   PCO2ART 28.9 (L) 08/24/2013 2337   PO2ART 84.0 08/24/2013 2337   HCO3 13.4 (L) 08/24/2013 2337   TCO2 14 08/24/2013 2337   ACIDBASEDEF 12.0 (H) 08/24/2013 2337   O2SAT 95.0 08/24/2013 2337     Coagulation Profile: Recent Labs  Lab 04/15/24 1806  INR 1.3*    Cardiac Enzymes: No results for input(s): CKTOTAL, CKMB, CKMBINDEX, TROPONINI in the last 168 hours.  HbA1C: No results found for: HGBA1C  CBG: Recent Labs  Lab 04/15/24 2153  GLUCAP 100*    Review of Systems:   ***  Past Medical History  He,  has a past medical history of Alzheimer dementia (HCC), Aortic aneurysm (HCC), Arthritis, Cancer (HCC) (2012), CKD (chronic kidney disease), stage III (HCC) (2014), Degenerative disc disease, Degenerative joint disease, GERD (gastroesophageal reflux disease), History of kidney stones, Hypercholesterolemia, Hypertension, Impaired glucose tolerance, Muscle fatigue, Peripheral vascular disease (HCC), Pneumonia, Raynaud's disease, Sepsis (HCC) (07/2013), Stroke [facility]), Thromboembolism of left popilteal vein (2015), and UTI (urinary tract infection) (08/17/2013).   Surgical History    Past Surgical History:  Procedure Laterality Date  . ABDOMINAL AORTIC ANEURYSM REPAIR  2011  . CATARACT EXTRACTION W/PHACO Left 02/15/2021   Procedure: CATARACT EXTRACTION PHACO AND INTRAOCULAR LENS PLACEMENT (IOC) LEFT 5.59 00:44.0;  Surgeon: Myrna Adine Anes, MD;  Location: [facility];  Service: Ophthalmology;  Laterality: Left;  . CATARACT EXTRACTION W/PHACO Right 03/08/2021   Procedure: CATARACT EXTRACTION PHACO AND INTRAOCULAR LENS PLACEMENT (IOC) RIGHT 1.81 00:22.7;  Surgeon: Myrna Adine Anes, MD;  Location: [facility];  Service: Ophthalmology;  Laterality: Right;  . HERNIA REPAIR     68mo old  . INSERTION OF MESH N/A 10/24/2014   Procedure: INSERTION OF MESH;  Surgeon: Deward Null III, MD;  Location: [facility] OR;  Service: General;  Laterality: N/A;  .  LAPAROTOMY N/A 08/24/2013   Procedure: EXPLORATORY LAPAROTOMY;  Surgeon: Deward GORMAN Null DOUGLAS, MD;  Location: [facility] OR;  Service: General;  Laterality: N/A;  Gram patch closure  . litrotripsy  09/24/2013  . NEPHROSTOMY  Nov. 2014  . nephrostomy removal  Dec. 2014  . PERIPHERALLY INSERTED CENTRAL CATHETER INSERTION    . TONSILLECTOMY    . UMBILICAL HERNIA REPAIR     50 yr ago  . VENTRAL HERNIA REPAIR N/A 10/24/2014   Procedure: LAPAROSCOPIC ASSISTED VENTRAL HERNIA REPAIR WITH MESH;  Surgeon: Deward Null III, MD;  Location: [facility] OR;  Service: General;  Laterality: N/A;  . XI ROBOTIC ASSISTED INGUINAL HERNIA REPAIR WITH MESH Bilateral 11/17/2022   Procedure: XI ROBOTIC ASSISTED INGUINAL HERNIA REPAIR WITH MESH-Bilateral;  Surgeon: Desiderio Schanz, MD;  Location: [facility] ORS;  Service: General;  Laterality: Bilateral;     Social History   reports that he has been smoking cigarettes. He has a 40 pack-year smoking history. He has been exposed to tobacco smoke. He has never used smokeless tobacco. He reports current alcohol use of about 3.0 standard drinks of alcohol per week. He reports that he does not use drugs.   Family History   His family history includes Atrial fibrillation in his sister; CVA in his father.   Allergies Allergies  Allergen Reactions  . Duloxetine Dermatitis     Home Medications  Prior to Admission medications   Medication Sig Start Date  End Date Taking? Authorizing Provider  acetaminophen  (TYLENOL ) 325 MG tablet Take 325 mg by mouth every 6 (six) hours as needed for mild pain.   Yes [provider]  amLODipine  (NORVASC ) 5 MG tablet TAKE 1 TABLET BY MOUTH  DAILY 12/24/19  Yes Brown, Fallon E, NP  cholecalciferol (VITAMIN D) 1000 UNITS tablet Take 1,000 Units by mouth daily.   Yes [provider]  Cyanocobalamin (VITAMIN B-12 ER PO) Take by mouth.   Yes [provider]  donepezil  (ARICEPT ) 10 MG tablet Take 10 mg by mouth at bedtime.   Yes [provider]  FOLIC ACID PO Take by mouth.   Yes [provider]  gabapentin  (NEURONTIN ) 100 MG capsule 200 mg 2 (two) times daily. 200 mg in am and 200 mg in PM 04/01/17  Yes [provider]  Lactobacillus ([employer] PROBIOTIC ACIDOPHILUS) 10 MG CAPS Take by mouth.   Yes [provider]  memantine (NAMENDA) 10 MG tablet TAKE 1 TABLET BY MOUTH TWICE DAILY FOR MEMORY LOSS 02/28/24  Yes [provider]  pantoprazole  (PROTONIX ) 40 MG tablet Take 40 mg by mouth at bedtime.  11/13/13  Yes [provider]  simvastatin  (ZOCOR ) 20 MG tablet Take 20 mg by mouth at bedtime.   Yes [provider]  vitamin B-12 (CYANOCOBALAMIN) 500 MCG tablet Take 500 mcg by mouth daily.   Yes [provider]  XARELTO  20 MG TABS tablet daily.  12/23/13  Yes [provider]  amoxicillin -clavulanate (AUGMENTIN ) 875-125 MG tablet :1 Tablet(s) By Mouth Every 12 Hours Patient not taking: Reported on 04/15/2024 02/20/24   [provider]  pregabalin (LYRICA) 100 MG capsule Take 100 mg by mouth 2 (two) times daily. Patient not taking: Reported on 04/15/2024 12/20/23   [provider]     Active Hospital Problem list   See systems below  Assessment & Plan:      Best practice:  Diet:  {Ipzu:73067} Pain/Anxiety/Delirium protocol (if indicated): {Pain/Anxiety/Delirium:26941} VAP protocol (if indicated): {VAP:29640} DVT prophylaxis: {DVT Prophylaxis:26933} GI prophylaxis: {GI:26934} Glucose control:  {Glucose Control:26935} Central venous access:  {Central Venous Access:26936} Arterial line:  {Central Venous Access:26936} Foley:  {Central Venous Access:26936} Mobility:  {Mobility:26937}  PT consulted: {PT Consult:26938} Last date of multidisciplinary goals of care discussion []  Code Status:  {Code Status:26939} Disposition: ***   = Goals of Care = Code Status Order: @CODE @   Primary Emergency ContactChayce, Robbins, Home Phone: [phone] Wishes to  pursue full aggressive treatment and intervention options, including CPR and intubation, but goals of care will be addressed on going with family if that should become necessary. Patient wishes to pursue ongoing treatment with relatively conservative measures (e.g., IV fluids, antibiotics), but would not wish to escalate to ICU level of care or other invasive measures. Patient wishes to pursue ongoing treatment, but concurred that if deteriorated to pulselessness, patient would prefer a natural death as opposed to invasive measures such as CPR and intubation.  Family should be immediately contacted in such situations.  Critical care time: 45 minutes        Almarie Nose DNP, CCRN, FNP-C, AGACNP-BC Acute Care & Family Nurse Practitioner  Pulmonary & Critical Care Medicine PCCM on call pager [phone]

## 2024-04-16 NOTE — Anesthesia Preprocedure Evaluation (Signed)
 Anesthesia Evaluation  Patient identified by MRN, date of birth, ID band Patient awake and Patient confused    Reviewed: Allergy & Precautions, NPO status , Patient's Chart, lab work & pertinent test results  Airway Mallampati: III  TM Distance: >3 FB Neck ROM: full    Dental  (+) Caps   Pulmonary neg pulmonary ROS, COPD, Current Smoker and Patient abstained from smoking.   Pulmonary exam normal  + decreased breath sounds      Cardiovascular Exercise Tolerance: Poor hypertension, Pt. on medications + Peripheral Vascular Disease and + DOE  negative cardio ROS Normal cardiovascular exam Rhythm:Regular Rate:Bradycardia  On esmolol  drip to control BP   Neuro/Psych       Dementia CVA, No Residual Symptoms negative neurological ROS  negative psych ROS   GI/Hepatic negative GI ROS, Neg liver ROS, PUD,GERD  Medicated,,  Endo/Other  negative endocrine ROS    Renal/GU negative Renal ROS  negative genitourinary   Musculoskeletal  (+) Arthritis ,    Abdominal Normal abdominal exam  (+)   Peds  Hematology negative hematology ROS (+)   Anesthesia Other Findings Past Medical History: No date: Alzheimer dementia (HCC) No date: Aortic aneurysm (HCC) No date: Arthritis     Comment:  knees are the biggest problem 2012: Cancer (HCC)     Comment:  bladder 2014: CKD (chronic kidney disease), stage III (HCC)     Comment:  relative to infection, septic- renal calculi No date: Degenerative disc disease No date: Degenerative joint disease No date: GERD (gastroesophageal reflux disease) No date: History of kidney stones No date: Hypercholesterolemia No date: Hypertension     Comment:  no meds currently No date: Impaired glucose tolerance No date: Muscle fatigue     Comment:  muscle fatigue relative to sepsis experience  No date: Peripheral vascular disease (HCC) No date: Pneumonia No date: Raynaud's disease 07/2013: Sepsis  (HCC) No date: Stroke [facility]) 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 unsure which leg, treating with Xarelto  08/17/2013: UTI (urinary tract infection)     Comment:  Lt nephrostomy tube and stent placement  Past Surgical History: 2011: ABDOMINAL AORTIC ANEURYSM REPAIR 02/15/2021: CATARACT EXTRACTION W/PHACO; Left     Comment:  Procedure: CATARACT EXTRACTION PHACO AND INTRAOCULAR               LENS PLACEMENT (IOC) LEFT 5.59 00:44.0;  Surgeon: Myrna Adine Anes, MD;  Location: [facility];                Service: Ophthalmology;  Laterality: Left; 03/08/2021: CATARACT EXTRACTION W/PHACO; Right     Comment:  Procedure: CATARACT EXTRACTION PHACO AND INTRAOCULAR               LENS PLACEMENT (IOC) RIGHT 1.81 00:22.7;  Surgeon: Myrna Adine Anes, MD;  Location: [facility];                Service: Ophthalmology;  Laterality: Right; No date: HERNIA REPAIR     Comment:  34mo old 10/24/2014: INSERTION OF MESH; N/A     Comment:  Procedure: INSERTION OF MESH;  Surgeon: Deward Null III,               MD;  Location: [facility] OR;  Service: General;  Laterality:  N/A; 08/24/2013: LAPAROTOMY; N/A     Comment:  Procedure: EXPLORATORY LAPAROTOMY;  Surgeon: Deward GORMAN Curvin DOUGLAS, MD;  Location: [facility] OR;  Service: General;                Laterality: N/A;  Gram patch closure 09/24/2013: litrotripsy Nov. 2014: NEPHROSTOMY Dec. 2014: nephrostomy removal No date: PERIPHERALLY INSERTED CENTRAL CATHETER INSERTION No date: TONSILLECTOMY No date: UMBILICAL HERNIA REPAIR     Comment:  50 yr ago 10/24/2014: VENTRAL HERNIA REPAIR; N/A     Comment:  Procedure: LAPAROSCOPIC ASSISTED VENTRAL HERNIA REPAIR               WITH MESH;  Surgeon: Deward Curvin III, MD;  Location: [facility] OR;              Service: General;  Laterality: N/A; 11/17/2022: XI ROBOTIC ASSISTED INGUINAL HERNIA REPAIR WITH MESH;  Bilateral     Comment:  Procedure: XI ROBOTIC  ASSISTED INGUINAL HERNIA REPAIR               WITH MESH-Bilateral;  Surgeon: Desiderio Schanz, MD;                Location: [facility] ORS;  Service: General;  Laterality:               Bilateral;  BMI    Body Mass Index: 30.64 kg/m      Reproductive/Obstetrics negative OB ROS                              Anesthesia Physical Anesthesia Plan  ASA: 3  Anesthesia Plan: General   Post-op Pain Management:    Induction: Intravenous  PONV Risk Score and Plan: Propofol  infusion and TIVA  Airway Management Planned: Natural Airway and Nasal Cannula  Additional Equipment:   Intra-op Plan:   Post-operative Plan:   Informed Consent: I have reviewed the patients History and Physical, chart, labs and discussed the procedure including the risks, benefits and alternatives for the proposed anesthesia with the patient or authorized representative who has indicated his/her understanding and acceptance.     Dental Advisory Given  Plan Discussed with: CRNA  Anesthesia Plan Comments:          Anesthesia Quick Evaluation

## 2024-04-16 NOTE — Consult Note (Signed)
 Nathan Copping, MD [facility]  [address], [address] [place], [place] [address] Phone: [phone] Fax : [phone]  Consultation  Referring Provider:     Dr. Isadora Primary Care Physician:  Jeffie Cheryl BRAVO, MD Primary Gastroenterologist: Sampson         Reason for Consultation:     Possible GI bleed  Date of Admission:  04/15/2024 Date of Consultation:  04/16/2024         HPI:   Nathan Rivera is a 78 y.o. male who has a history of hyperlipidemia stage III kidney disease duodenal ulcers and peripheral vascular disease with a ruptured AAA status post repair 2 years ago.  The patient has been on Xarelto  and presents to the emergency department with abdominal pain and questionable hemoptysis versus hematemesis.  The patient is now on esmolol  drip and I am being asked to see the patient due to his need for anticoagulation after repair of his AAA endoleak type I.  The patient's hemoglobin has remained stable.  Past Medical History:  Diagnosis Date   Alzheimer dementia [facility])    Aortic aneurysm (HCC)    Arthritis    knees are the biggest problem   [facility]) 2012   bladder   CKD (chronic kidney disease), stage III (HCC) 2014   relative to infection, septic- renal calculi   Degenerative disc disease    Degenerative joint disease    GERD (gastroesophageal reflux disease)    History of kidney stones    Hypercholesterolemia    Hypertension    no meds currently   Impaired glucose tolerance    Muscle fatigue    muscle fatigue relative to sepsis experience    Peripheral vascular disease (HCC)    Pneumonia    Raynaud's disease    Sepsis (HCC) 07/2013   Stroke (HCC)    Thromboembolism of left popilteal vein 2015   pt. unsure which leg, treating with Xarelto    UTI (urinary tract infection) 08/17/2013   Lt nephrostomy tube and stent placement    Past Surgical History:  Procedure Laterality Date   ABDOMINAL AORTIC ANEURYSM REPAIR  2011   CATARACT EXTRACTION W/PHACO Left  02/15/2021   Procedure: CATARACT EXTRACTION PHACO AND INTRAOCULAR LENS PLACEMENT (IOC) LEFT 5.59 00:44.0;  Surgeon: Myrna Adine Anes, MD;  Location: [facility];  Service: Ophthalmology;  Laterality: Left;   CATARACT EXTRACTION W/PHACO Right 03/08/2021   Procedure: CATARACT EXTRACTION PHACO AND INTRAOCULAR LENS PLACEMENT (IOC) RIGHT 1.81 00:22.7;  Surgeon: Myrna Adine Anes, MD;  Location: [facility];  Service: Ophthalmology;  Laterality: Right;   HERNIA REPAIR     28mo old   INSERTION OF MESH N/A 10/24/2014   Procedure: INSERTION OF MESH;  Surgeon: Deward Null III, MD;  Location: [facility] OR;  Service: General;  Laterality: N/A;   LAPAROTOMY N/A 08/24/2013   Procedure: EXPLORATORY LAPAROTOMY;  Surgeon: Deward GORMAN Null DOUGLAS, MD;  Location[facility] OR;  Service: General;  Laterality: N/A;  Gram patch closure   litrotripsy  09/24/2013   NEPHROSTOMY  Nov. 2014   nephrostomy removal  Dec. 2014   PERIPHERALLY INSERTED CENTRAL CATHETER INSERTION     TONSILLECTOMY     UMBILICAL HERNIA REPAIR     50 yr ago   VENTRAL HERNIA REPAIR N/A 10/24/2014   Procedure: LAPAROSCOPIC ASSISTED VENTRAL HERNIA REPAIR WITH MESH;  Surgeon: Deward Null III, MD;  Location: [facility] OR;  Service: General;  Laterality: N/A;   XI ROBOTIC ASSISTED INGUINAL HERNIA  REPAIR WITH MESH Bilateral 11/17/2022   Procedure: XI ROBOTIC ASSISTED INGUINAL HERNIA REPAIR WITH MESH-Bilateral;  Surgeon: Desiderio Schanz, MD;  Location: [facility] ORS;  Service: General;  Laterality: Bilateral;    Prior to Admission medications   Medication Sig Start Date End Date Taking? Authorizing Provider  acetaminophen  (TYLENOL ) 325 MG tablet Take 325 mg by mouth every 6 (six) hours as needed for mild pain.   Yes [provider]  amLODipine  (NORVASC ) 5 MG tablet TAKE 1 TABLET BY MOUTH  DAILY 12/24/19  Yes Brown, Fallon E, NP  cholecalciferol (VITAMIN D) 1000 UNITS tablet Take 1,000 Units by mouth daily.   Yes [provider]  Cyanocobalamin (VITAMIN B-12  ER PO) Take by mouth.   Yes [provider]  donepezil  (ARICEPT ) 10 MG tablet Take 10 mg by mouth at bedtime.   Yes [provider]  FOLIC ACID PO Take by mouth.   Yes [provider]  gabapentin  (NEURONTIN ) 100 MG capsule 200 mg 2 (two) times daily. 200 mg in am and 200 mg in PM 04/01/17  Yes [provider]  Lactobacillus ([employer] PROBIOTIC ACIDOPHILUS) 10 MG CAPS Take by mouth.   Yes [provider]  memantine (NAMENDA) 10 MG tablet TAKE 1 TABLET BY MOUTH TWICE DAILY FOR MEMORY LOSS 02/28/24  Yes [provider]  pantoprazole  (PROTONIX ) 40 MG tablet Take 40 mg by mouth at bedtime.  11/13/13  Yes [provider]  simvastatin  (ZOCOR ) 20 MG tablet Take 20 mg by mouth at bedtime.   Yes [provider]  vitamin B-12 (CYANOCOBALAMIN) 500 MCG tablet Take 500 mcg by mouth daily.   Yes [provider]  XARELTO  20 MG TABS tablet daily.  12/23/13  Yes [provider]  amoxicillin -clavulanate (AUGMENTIN ) 875-125 MG tablet :1 Tablet(s) By Mouth Every 12 Hours Patient not taking: Reported on 04/15/2024 02/20/24   [provider]  pregabalin (LYRICA) 100 MG capsule Take 100 mg by mouth 2 (two) times daily. Patient not taking: Reported on 04/15/2024 12/20/23   [provider]    Family History  Problem Relation Age of Onset   CVA Father    Atrial fibrillation Sister      Social History   Tobacco Use   Smoking status: Every Day    Current packs/day: 1.00    Average packs/day: 1 pack/day for 40.0 years (40.0 ttl pk-yrs)    Types: Cigarettes    Passive exposure: Past   Smokeless tobacco: Never   Tobacco comments:    since age 48  Vaping Use   Vaping status: Never Used  Substance Use Topics   Alcohol use: Yes    Alcohol/week: 3.0 standard drinks of alcohol    Types: 3 Glasses of wine per week   Drug use: No    Allergies as of 04/15/2024 - Review Complete 04/15/2024  Allergen Reaction Noted    Duloxetine Dermatitis 02/28/2024    Review of Systems:    All systems reviewed and negative except where noted in HPI.   Physical Exam:  Vital signs in last 24 hours: Temp:  [97.7 F (36.5 C)-98.1 F (36.7 C)] 97.7 F (36.5 C) (07/22 0315) Pulse Rate:  [31-106] 46 (07/22 1100) Resp:  [10-33] 13 (07/22 1100) BP: (105-226)/(59-117) 116/70 (07/22 1100) SpO2:  [83 %-100 %] 96 % (07/22 1122) Weight:  [94.1 kg-99.8 kg] 94.1 kg (07/22 0500)   General:   Pleasant, cooperative in NAD Head:  Normocephalic and atraumatic. Eyes:   No icterus.  Conjunctiva pink. PERRLA. Ears:  Normal auditory acuity. Rectal:  Not performed. Neurologic:  Alert and oriented x3;  grossly normal neurologically. Skin:  Intact without significant lesions or rashes. Psych:  Alert and cooperative. Normal affect.  LAB RESULTS: Recent Labs    04/15/24 1447 04/16/24 0339  WBC 9.1 10.2  HGB 15.3 14.5  HCT 47.3 44.0  PLT 160 165   BMET Recent Labs    04/15/24 1447 04/16/24 0339  NA 140 142  K 4.2 4.2  CL 105 108  CO2 24 24  GLUCOSE 94 96  BUN 39* 43*  CREATININE 2.43* 2.44*  CALCIUM 9.4 8.8*   LFT No results for input(s): PROT, ALBUMIN , AST, ALT, ALKPHOS, BILITOT, BILIDIR, IBILI in the last 72 hours. PT/INR Recent Labs    04/15/24 1806  LABPROT 16.9*  INR 1.3*    STUDIES: CT Angio Chest/Abd/Pel for Dissection W and/or Wo Contrast Result Date: 04/15/2024 CLINICAL DATA:  Coughing up blood EXAM: CT ANGIOGRAPHY CHEST, ABDOMEN AND PELVIS TECHNIQUE: Non-contrast CT of the chest was initially obtained. Multidetector CT imaging through the chest, abdomen and pelvis was performed using the standard protocol during bolus administration of intravenous contrast. Multiplanar reconstructed images and MIPs were obtained and reviewed to evaluate the vascular anatomy. RADIATION DOSE REDUCTION: This exam was performed according to the departmental dose-optimization program which includes automated  exposure control, adjustment of the mA and/or kV according to patient size and/or use of iterative reconstruction technique. CONTRAST:  80mL OMNIPAQUE  IOHEXOL  350 MG/ML SOLN COMPARISON:  CT 10/20/2022 FINDINGS: CTA CHEST FINDINGS Cardiovascular: Non contrasted images of the chest demonstrate no acute intramural hematoma. Negative for aortic dissection. Focal outpouching measuring 14 mm at the distal arch either representing prominent ductus diverticulum or aneurysm, but sludge least stable size compared with noncontrast CT from 2015. No extravasation identified within the chest. Coronary vascular calcification. Normal cardiac size. No pericardial effusion. Mediastinum/Nodes: Patent trachea. No thyroid mass. No suspicious lymph nodes. Esophagus within normal limits. Calcified nodes consistent with prior granulomatous disease. Lungs/Pleura: No pleural effusion or pneumothorax. Patchy mild peribronchovascular ground-glass disease. No consolidation. Musculoskeletal: Sternum appears intact. No acute osseous abnormality. Review of the MIP images confirms the above findings. CTA ABDOMEN AND PELVIS FINDINGS VASCULAR Aorta: Aortic atherosclerosis. Status post infrarenal abdominal aortoiliac stent graft. The proximal end of the stent is just below the level of the renal arteries. Aneurysmal dilatation of the abdominal aorta at the level of the renal arteries, slightly superior to the proximal end of the stent, this measures 4.6 x 3.7 cm on axial series 6, image 180, previously 4.2 by 3.2 cm. Saccular appearing component of the aneurysm at the level of the stent, coronal series 11, image 74, this appears increased in size compared to the non contrasted exam from January and there may be some soft tissue thickening along the saccular portion of the aneurysm on coronal series 11, image 73. No dissection is seen. 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 Possible small 5 mm aneurysm at the origin  of the celiac artery, series 12, image 108. SMA: Mild-to-moderate stenosis of the proximal SMA just distal to the origin, series 12, image 111. Distal vessel patency without dissection. Renals: Accessory upper pole right renal artery with severe stenosis at the origin, series 11, image 80. 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 String like flow at the proximal right renal artery, series 11 image 75, consistent with severe stenosis  over a 12 mm length. Small accessory upper pole renal artery on the left with suspected severe stenosis at the origin. Dominant left renal artery with severe stenosis at the origin, series 6, image 172 and coronal series 11 image 74 and 75. IMA: Poorly visible, small vessel supplying the perirectal is traceable to the iliac stent supplying the left iliac vessel. 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 Aneurysmal dilatation of the right common iliac artery at the distal end of the stent measuring up to 2.8 cm on series 11, image 73. Multiple small aneurysms of the proximal internal and iliac vessels. No high-grade stenosis or occlusive disease of the distal external iliac vessels. Veins: Suboptimally assessed Review of the MIP images confirms the above findings. NON-VASCULAR Hepatobiliary: Small gallstone. No biliary dilatation. No focal hepatic abnormality. Pancreas: Unremarkable. No pancreatic ductal dilatation or surrounding inflammatory changes. Spleen: Multiple calcified granuloma Adrenals/Urinary Tract: Adrenal glands are within normal limits. Kidneys show no hydronephrosis. Multiple renal cysts for which no imaging follow-up is recommended. Slightly thick-walled urinary bladder. Indeterminate slightly dense exophytic lesion off the upper pole right kidney measuring 17 mm, series 6, image 168. Stomach/Bowel: The stomach is non  enlarged. There is no dilated small bowel. No acute bowel wall thickening. Negative appendix. Diverticular disease of the colon. Lymphatic: No suspicious lymph nodes. Reproductive: Negative prostate Other: Negative for pelvic effusion or free air. Previously noted bilateral inguinal hernias are resolved/repair. Musculoskeletal: No acute or suspicious osseous abnormality. Review of the MIP images confirms the above findings. IMPRESSION: 1. Negative for acute aortic dissection or intramural hematoma. No extravasation identified within the chest, abdomen, or pelvis. 2. Status post infrarenal abdominal aortoiliac stent graft. Aneurysmal dilatation of the abdominal aorta at the level of the renal arteries, slightly superior to the proximal end of the stent, this measures 4.6 x 3.7 cm, previously 4.2 x 3.2 cm. Saccular appearing component of the aneurysm at the level of the proximal stent appears increased in size compared to the non contrasted exam from January and there may be some soft tissue thickening and stranding along the inferior aspect of the saccular part of the aneurysm raising concern for possible leakage or impending rupture, vascular surgery consultation is recommended. 3. 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 Aneurysmal dilatation of the right common iliac artery at the distal end of the stent measuring up to 2.8 cm. Multiple small aneurysms of the proximal internal and iliac vessels. 4. 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 There are small accessory upper pole renal arteries bilaterally with suspected severe stenosis at the origin of these vessels. 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 5. 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 Possible small 5 mm aneurysm at the origin of the celiac artery. 6. Stable prominent ductus diverticulum or  distal arch small aneurysm 7. Patchy mild peribronchovascular ground-glass disease within the lungs, suggestive of respiratory infection. No consolidative pneumonia. 8. Cholelithiasis. Diverticular disease of the colon. 9. 16 mm indeterminate exophytic lesion off the upper pole right kidney, could initially correlate with ultrasound 10. Slightly thick-walled urinary bladder, correlate for cystitis. 11. Interval bilateral inguinal hernia repair. 12. Aortic atherosclerosis. Aortic Atherosclerosis (ICD10-I70.0). Electronically Signed   By: Luke Bun M.D.   On: 04/15/2024 19:47   DG Chest  2 View Result Date: 04/15/2024 CLINICAL DATA:  Hemoptysis since last night, worsening today. Increased lower extremity swelling for 1 week. EXAM: CHEST - 2 VIEW COMPARISON:  Radiographs 08/26/2013 and 08/24/2013. Chest CT 04/01/2014. FINDINGS: The lateral view was repeated. The heart size and mediastinal contours are normal. 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 The lungs appear clear. No pleural effusion or pneumothorax. Calcified splenic granulomas are noted. Possible old rib fractures bilaterally without acute osseous abnormality. IMPRESSION: No radiographic evidence of active cardiopulmonary process. If the patient has persistent unexplained hemoptysis, follow-up CT should be considered. Electronically Signed   By: Elsie Perone M.D.   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 y/o male with hemoptysis versus hematemesis who is in need of a repair with likely anticoagulation needed after the procedure.  I am now being asked to see the patient to rule out a upper GI bleed as a cause of the blood that was seen.  Plan:  I have spoken to the patient and his wife and the patient has been evaluated by  anesthesiology for an upper endoscopy.  They agree with proceeding with a upper endoscopy to look for any sign of GI bleeding.  The patient is wife have been explained the plan and agree with it.  Thank you for involving me in the care of this patient.      LOS: 1 day   Nathan Copping, MD, MD. NOLIA 04/16/2024, 11:35 AM,  Pager [phone] 7am-5pm  Check AMION for 5pm -7am coverage and on weekends   Note: This dictation was prepared with Dragon dictation along with smaller phrase technology. Any transcriptional errors that result from this process are unintentional.

## 2024-04-16 NOTE — Progress Notes (Signed)
 The patient had an EGD without any sign of active or past bleeding. No ulcers or inflammation seen. Consider other source of bleeding. Nothing further to do from a GI point of view.  I will sign off.  Please call if any further GI concerns or questions.  We would like to thank you for the opportunity to participate in the care of Nathan Rivera.

## 2024-04-16 NOTE — Care Management Important Message (Signed)
 Important Message  Patient Details  Name: Nathan Rivera MRN: [id number] Date of Birth: 1946/02/12   Important Message Given:  Yes - Medicare IM     Rojelio [name] 04/16/2024, 1:19 PM

## 2024-04-16 NOTE — Consult Note (Signed)
 PHARMACY CONSULT NOTE - ELECTROLYTES  Pharmacy Consult for Electrolyte Monitoring and Replacement   Recent Labs: Height: 5' 9 (175.3 cm) Weight: 94.1 kg (207 lb 7.3 oz) IBW/kg (Calculated) : 70.7 Estimated Creatinine Clearance: 28.3 mL/min (A) (by C-G formula based on SCr of 2.44 mg/dL (H)). Potassium (mmol/L)  Date Value  04/16/2024 4.2  10/23/2014 4.1   Magnesium (mg/dL)  Date Value  92/77/7974 2.3   Calcium (mg/dL)  Date Value  92/77/7974 8.8 (L)   Calcium, Total (mg/dL)  Date Value  98/71/7983 9.0   Albumin  (g/dL)  Date Value  98/74/7975 4.1  08/18/2013 1.4 (L)   Phosphorus (mg/dL)  Date Value  92/77/7974 3.9   Sodium (mmol/L)  Date Value  04/16/2024 142  10/23/2014 144    Assessment  Nathan Rivera is a 78 y.o. male presenting with abdominal pain and hemoptysis. PMH significant for Infrarenal abdominal aortic aneurysm s/p endovascular repair of a ruptured AAA in 2011 on Xarelto , PAD, HTN, duodenal ulcer with perforation s/p omental patch repair, CKD stage III, thrombocytopenia, HDL, prior tobacco use, ESBL bacteremia, bladder cancer, chronic back pain, and bilateral inguinal hernia s/p repair . Pharmacy has been consulted to monitor and replace electrolytes.  Diet: NPO MIVF: N/A Pertinent medications: clevidipine  gtt, esmolol  gtt  Goal of Therapy: Electrolytes WNL  Plan:  No replacement currently indicated Check BMP, Mg, Phos with AM labs  Thank you for allowing pharmacy to be a part of this patient's care.  Tyishia Aune A Leauna Sharber, PharmD Clinical Pharmacist 04/16/2024 7:37 AM

## 2024-04-17 ENCOUNTER — Inpatient Hospital Stay

## 2024-04-17 ENCOUNTER — Encounter: Payer: Self-pay | Admitting: Gastroenterology

## 2024-04-17 ENCOUNTER — Encounter: Payer: Self-pay | Admitting: Anesthesiology

## 2024-04-17 DIAGNOSIS — I723 Aneurysm of iliac artery: Secondary | ICD-10-CM | POA: Diagnosis not present

## 2024-04-17 DIAGNOSIS — R042 Hemoptysis: Secondary | ICD-10-CM | POA: Diagnosis not present

## 2024-04-17 DIAGNOSIS — N179 Acute kidney failure, unspecified: Secondary | ICD-10-CM | POA: Diagnosis not present

## 2024-04-17 DIAGNOSIS — T82310A Breakdown (mechanical) of aortic (bifurcation) graft (replacement), initial encounter: Secondary | ICD-10-CM | POA: Diagnosis not present

## 2024-04-17 DIAGNOSIS — I169 Hypertensive crisis, unspecified: Secondary | ICD-10-CM | POA: Diagnosis not present

## 2024-04-17 LAB — CBC WITH DIFFERENTIAL/PLATELET
Abs Immature Granulocytes: 0.05 K/uL (ref 0.00–0.07)
Basophils Absolute: 0.1 K/uL (ref 0.0–0.1)
Basophils Relative: 0 %
Eosinophils Absolute: 0.3 K/uL (ref 0.0–0.5)
Eosinophils Relative: 2 %
Hemoglobin: 16.6 g/dL (ref 13.0–17.0)
Immature Granulocytes: 0 %
Lymphocytes Relative: 11 %
Lymphs Abs: 1.2 K/uL (ref 0.7–4.0)
Monocytes Absolute: 0.7 K/uL (ref 0.1–1.0)
Monocytes Relative: 6 %
Neutro Abs: 8.9 K/uL — ABNORMAL HIGH (ref 1.7–7.7)
Neutrophils Relative %: 81 %
Platelets: 156 K/uL (ref 150–400)
WBC: 11.2 K/uL — ABNORMAL HIGH (ref 4.0–10.5)

## 2024-04-17 LAB — BASIC METABOLIC PANEL WITH GFR
Anion gap: 13 (ref 5–15)
BUN: 50 mg/dL — ABNORMAL HIGH (ref 8–23)
CO2: 20 mmol/L — ABNORMAL LOW (ref 22–32)
Calcium: 8.7 mg/dL — ABNORMAL LOW (ref 8.9–10.3)
Chloride: 107 mmol/L (ref 98–111)
Creatinine, Ser: 3.08 mg/dL — ABNORMAL HIGH (ref 0.61–1.24)
GFR, Estimated: 20 mL/min — ABNORMAL LOW (ref >60)
Glucose, Bld: 102 mg/dL — ABNORMAL HIGH (ref 70–99)
Potassium: 4.4 mmol/L (ref 3.5–5.1)
Sodium: 140 mmol/L (ref 135–145)

## 2024-04-17 LAB — PATHOLOGIST SMEAR REVIEW

## 2024-04-17 LAB — PHOSPHORUS: Phosphorus: 4.3 mg/dL (ref 2.5–4.6)

## 2024-04-17 LAB — TRIGLYCERIDES: Triglycerides: 168 mg/dL — ABNORMAL HIGH (ref <150)

## 2024-04-17 LAB — MAGNESIUM: Magnesium: 2.3 mg/dL (ref 1.7–2.4)

## 2024-04-17 MED ORDER — CEFAZOLIN SODIUM-DEXTROSE 2-4 GM/100ML-% IV SOLN
2.0000 g | INTRAVENOUS | Status: DC
  Filled 2024-04-17: qty 100

## 2024-04-17 MED ORDER — IOHEXOL 9 MG/ML PO SOLN
500.0000 mL | ORAL | Status: AC
  Administered 2024-04-17 (×2): 500 mL via ORAL

## 2024-04-17 MED ORDER — FENTANYL CITRATE PF 50 MCG/ML IJ SOSY
25.0000 ug | PREFILLED_SYRINGE | Freq: Once | INTRAMUSCULAR | Status: AC
  Administered 2024-04-17: 25 ug via INTRAVENOUS
  Filled 2024-04-17: qty 1

## 2024-04-17 NOTE — Progress Notes (Signed)
 Progress Note    04/17/2024 12:42 PM 1 Day Post-Op  Subjective:  This is a 78 y.o. male  with significant PMH of Infrarenal abdominal aortic aneurysm s/p endovascular repair of a ruptured AAA in 2011 on Xarelto , PAD, HTN, duodenal ulcer with perforation s/p omental patch repair, CKD stage III, thrombocytopenia, HDL, prior tobacco use, ESBL bacteremia, bladder cancer, chronic back pain, and bilateral inguinal hernia s/p repair who presented to the ED with chief complaints of abdominal pain and hemoptysis.   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 Also noted was right iliac dilatation of 2.8 cm at the distal end of the stent. Vascular surgery was consulted to evaluate.   Plan was to take the patient to the vascular lab today 04/17/24 for repair of the above mentioned aneurysms. However on daily blood work this morning the patient is noted to have an elevation in creatine from 2.3 to 3.1 this morning. This has been cancelled for today. ICU team was aware and has been asked to consult Nephrology.     Vitals:   04/17/24 0945 04/17/24 1000  BP: 114/81 114/65  Pulse: (!) 44 (!) 44  Resp: 16 14  Temp:    SpO2: 90% 94%   Physical Exam: Cardiac:  Regular Rate but bradycardic at 40 bpm's. No murmurs appreciated.  Lungs:  Non labored clear throughout on auscultation.  No rales rhonchi or wheezing. Incisions: None Extremities: All extremities with palpable pulses and warm to touch. Abdomen: Positive bowel sounds throughout, soft, nontender and nondistended Neurologic: Alert and oriented x 3, answers all questions and follows commands appropriately.  CBC    Component Value Date/Time   WBC 11.2 (H) 04/17/2024 0836    RBC RESULTS UNAVAILABLE DUE TO INTERFERING SUBSTANCE 04/17/2024 0836   HGB 16.6 04/17/2024 0836   HGB 10.3 (L) 08/18/2013 0504   HCT RESULTS UNAVAILABLE DUE TO INTERFERING SUBSTANCE 04/17/2024 0836   HCT 30.9 (L) 08/18/2013 0504   PLT 156 04/17/2024 0836   PLT 161 08/18/2013 0504   MCV RESULTS UNAVAILABLE DUE TO INTERFERING SUBSTANCE 04/17/2024 0836   MCV 86 08/18/2013 0504   MCH RESULTS UNAVAILABLE DUE TO INTERFERING SUBSTANCE 04/17/2024 0836   MCHC RESULTS UNAVAILABLE DUE TO INTERFERING SUBSTANCE 04/17/2024 0836   RDW RESULTS UNAVAILABLE DUE TO INTERFERING SUBSTANCE 04/17/2024 0836   RDW 16.0 (H) 08/18/2013 0504   LYMPHSABS 1.2 04/17/2024 0836   LYMPHSABS 2.3 08/18/2013 0504   MONOABS 0.7 04/17/2024 0836   MONOABS 1.1 (H) 08/18/2013 0504   EOSABS 0.3 04/17/2024 0836   EOSABS 0.2 08/18/2013 0504   BASOSABS 0.1 04/17/2024 0836   BASOSABS 0.0 08/18/2013 0504    BMET    Component Value Date/Time   NA 140 04/17/2024 0357   NA 144 10/23/2014 1630   K 4.4 04/17/2024 0357   K 4.1 10/23/2014 1630   CL 107 04/17/2024 0357   CL 112 (H) 10/23/2014 1630   CO2 20 (L) 04/17/2024 0357   CO2 26 10/23/2014 1630   GLUCOSE 102 (H) 04/17/2024 0357   GLUCOSE 83 10/23/2014 1630   BUN 50 (H) 04/17/2024 0357   BUN 30 (H) 10/23/2014 1630   CREATININE 3.08 (H) 04/17/2024 0357   CREATININE 1.96 (H) 10/23/2014 1630  CALCIUM 8.7 (L) 04/17/2024 0357   CALCIUM 9.0 10/23/2014 1630   GFRNONAA 20 (L) 04/17/2024 0357   GFRNONAA 36 (L) 10/23/2014 1630   GFRNONAA 8 (L) 08/18/2013 0504   GFRAA 39 (L) 10/25/2014 0352   GFRAA 44 (L) 10/23/2014 1630   GFRAA 9 (L) 08/18/2013 0504    INR    Component Value Date/Time   INR 1.3 (H) 04/15/2024 1806   INR 1.5 10/20/2014 0833     Intake/Output Summary (Last 24 hours) at 04/17/2024 1242 Last data filed at 04/17/2024 0351 Gross per 24 hour  Intake 2716.86 ml  Output 150 ml  Net 2566.86 ml     Assessment/Plan:  78 y.o. male who presented to [facility]  emergency department with chief complaint of abdominal pain and hemoptysis.  Upon workup the patient underwent a CTA of the chest abdomen pelvis.  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 Cordella Shawl MD.  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  There is also aneurysmal dilatation of the right common iliac artery at the distal end of the stent measuring 2.8 cm.  There is also severe stenosis at the origin of both renal arteries.  1 Day Post-Op   Vascular surgery plan on taking the patient to the vascular lab today on 04/17/2024 but the patient's had a marked increase in his BUN and creatinine today.  Yesterday's BUN and creatinine were 43/2.44 with a GFR estimated to be 26.  Today's BUN and creatinine are 50/3.08 with an estimated GFR of 28.  This elevation could possibly be from a decrease in the patient's blood pressure that was started to help control aneurysmal dilatation of the known aneurysms.  Patient's been on Cleviprex  at 21 mg an hour and esmolol  at 25 mcg/kg/min.  Vascular surgery recommends nephrology consult for possible temporary hemodialysis to help reduce the kidney strain.  If the patient's BUN and creatinine over the next 24 to 48 hours can be corrected vascular surgery with plan on taking the patient back to the vascular lab on Friday, 04/19/2024 to correct the patient's renal stenosis and help with the patient's overall renal status thus allowing us  possibly next week to repair the abdominal aortic aneurysms and iliac aneurysms.  I had a long conversation with the patient and his wife at the bedside this afternoon regarding his kidney status and how this affects the plan going forward to repair his abdominal aortic aneurysms and iliac aneurysms.  They both verbalized her understanding and wished to  proceed accordingly.  Both verbalize they would not like the patient to require long-term dialysis if this can be avoided.  They are both in agreement that if he needs temporary dialysis this would be appropriate.  I discussed the case in detail with Dr. Cordella Shawl MD and Dr. Selinda Gu MD and they agree with the plan.   DVT prophylaxis: None   [name] [facility] 04/17/2024 12:42 PM

## 2024-04-17 NOTE — Consult Note (Signed)
 [facility]  CONSULT NOTE    Date: 04/17/2024                  Patient Name:  Nathan Rivera  MRN: [id number]  DOB: [birth year]  Age / Sex: 78 y.o., male         PCP: Jeffie Cheryl BRAVO, MD                 Service Requesting Consult: [facility]                 Reason for Consult: Acute kidney injury on chronic kidney  disease            History of Present Illness: Nathan Rivera is a 78 y.o.  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 He is seen laying in bed with wife, [name], at bedside. Patient was seen by nephrologist in 2016, Dr Jimmie but was told his kidney function was stable and didn't need to follow up any further. He appears to have multiple vascular concerns. Levo and clevidipine  in place. Also receiving esmolol . 6L [place].   Labs on ED arrival shows creatinine 2.43 with GFR 27, BNP 394. Chest xray negative. CT angio chest, abdomen, pelvis for aortic dissection or intramural hematoma, large aneurysm and left common iliac artery, severe stenosis bilateral dominant renal arteries with suspected stenosis and bilateral assess 3 renal arteries, moderate to severe focal stenosis at celiac artery, and suspected respiratory infection.   Medications: Outpatient medications: Medications Prior to Admission  Medication Sig Dispense Refill Last Dose/Taking   acetaminophen  (TYLENOL ) 325 MG tablet Take 325 mg by mouth every 6 (six) hours as needed for mild pain.   04/15/2024 Morning   amLODipine   (NORVASC ) 5 MG tablet TAKE 1 TABLET BY MOUTH  DAILY 90 tablet 4 04/14/2024 Evening   cholecalciferol (VITAMIN D) 1000 UNITS tablet Take 1,000 Units by mouth daily.   04/14/2024 at 10:00 PM   Cyanocobalamin (VITAMIN B-12 ER PO) Take by mouth.   04/14/2024 Evening   donepezil  (ARICEPT ) 10 MG tablet Take 10 mg by mouth at bedtime.   04/14/2024 Evening   FOLIC ACID PO Take by mouth.   04/14/2024 Evening   gabapentin  (NEURONTIN ) 100 MG capsule 200 mg 2 (two) times daily. 200 mg in am and 200 mg in PM   04/15/2024 Morning   Lactobacillus ([employer] PROBIOTIC ACIDOPHILUS) 10 MG CAPS Take by mouth.   04/14/2024 Evening   memantine (NAMENDA) 10 MG tablet TAKE 1 TABLET BY MOUTH TWICE DAILY FOR MEMORY LOSS   04/15/2024 Morning   pantoprazole  (PROTONIX ) 40 MG tablet Take 40 mg by mouth at bedtime.    04/14/2024 Evening   simvastatin  (ZOCOR ) 20 MG tablet Take 20 mg by mouth at bedtime.   04/14/2024 Evening   vitamin B-12 (CYANOCOBALAMIN) 500 MCG tablet Take 500 mcg by mouth daily.   04/14/2024 Evening   XARELTO  20 MG TABS tablet daily.    04/14/2024 Evening   amoxicillin -clavulanate (AUGMENTIN ) 875-125 MG tablet :1 Tablet(s) By Mouth Every 12 Hours (Patient not taking: Reported on 04/15/2024)   Not Taking  pregabalin (LYRICA) 100 MG capsule Take 100 mg by mouth 2 (two) times daily. (Patient not taking: Reported on 04/15/2024)   Not Taking    Current medications: Current Facility-Administered Medications  Medication Dose Route Frequency Provider Last Rate Last Admin   amLODipine  (NORVASC ) tablet 10 mg  10 mg Oral Daily Keene, Jeremiah D, NP   10 mg at 04/17/24 0913   azithromycin  (ZITHROMAX ) 500 mg in sodium chloride  0.9 % 250 mL IVPB  500 mg Intravenous Q24H Jinny Carmine, MD 250 mL/hr at 04/17/24 0911 500 mg at 04/17/24 0911   [START ON 04/22/2024] ceFAZolin  (ANCEF ) IVPB 2g/100 mL premix  2 g Intravenous 30 min Pre-Op Nazari, Walid A, RPH       cefTRIAXone  (ROCEPHIN ) 2 g in sodium chloride  0.9 % 100 mL IVPB  2 g Intravenous  Q24H Jinny Carmine, MD 200 mL/hr at 04/16/24 1928 2 g at 04/16/24 1928   Chlorhexidine  Gluconate Cloth 2 % PADS 6 each  6 each Topical Daily Isadora Hose, MD   6 each at 04/16/24 2128   clevidipine  (CLEVIPREX ) infusion  0-21 mg/hr Intravenous Continuous Jinny Carmine, MD 42 mL/hr at 04/17/24 1454 21 mg/hr at 04/17/24 1454   diphenhydrAMINE  (BENADRYL ) injection 50 mg  50 mg Intravenous Once PRN Pace, Brien R, NP       docusate sodium  (COLACE) capsule 100 mg  100 mg Oral BID PRN Jinny Carmine, MD       esmolol  (BREVIBLOC ) 2000 mg / 100 mL (20 mg/mL) infusion  25-300 mcg/kg/min Intravenous Titrated Jinny Carmine, MD 7.49 mL/hr at 04/17/24 0507 25 mcg/kg/min at 04/17/24 0507   famotidine  (PEPCID ) tablet 40 mg  40 mg Oral Once PRN Pace, Brien R, NP       hydrALAZINE  (APRESOLINE ) injection 10 mg  10 mg Intravenous Q6H PRN Keene, Jeremiah D, NP   10 mg at 04/16/24 1744   hydrALAZINE  (APRESOLINE ) tablet 25 mg  25 mg Oral Q8H Keene, Jeremiah D, NP   25 mg at 04/17/24 1404   iohexol  (OMNIPAQUE ) 9 MG/ML oral solution 500 mL  500 mL Oral Q1H Dgayli, Hose, MD       methylPREDNISolone  sodium succinate (SOLU-MEDROL ) 125 mg/2 mL injection 125 mg  125 mg Intravenous Once PRN Pace, Brien R, NP       midazolam  (VERSED ) 2 MG/ML syrup 8 mg  8 mg Oral Once PRN Pace, Brien R, NP       Oral care mouth rinse  15 mL Mouth Rinse PRN Wohl, Darren, MD       polyethylene glycol (MIRALAX  / GLYCOLAX ) packet 17 g  17 g Oral Daily PRN Jinny Carmine, MD          Allergies: Allergies  Allergen Reactions   Duloxetine Dermatitis      Past Medical History: Past Medical History:  Diagnosis Date   Alzheimer dementia (HCC)    Aortic aneurysm (HCC)    Arthritis    knees are the biggest problem   Cancer (HCC) 2012   bladder   CKD (chronic kidney disease), stage III (HCC) 2014   relative to infection, septic- renal calculi   Degenerative disc disease    Degenerative joint disease    GERD (gastroesophageal reflux disease)     History of kidney stones    Hypercholesterolemia    Hypertension    no meds currently   Impaired glucose tolerance    Muscle fatigue    muscle fatigue relative to sepsis experience    Peripheral vascular disease (HCC)  Pneumonia    Raynaud's disease    Sepsis (HCC) 07/2013   Stroke [facility])    Thromboembolism of left popilteal vein 2015   pt. unsure which leg, treating with Xarelto    UTI (urinary tract infection) 08/17/2013   Lt nephrostomy tube and stent placement     Past Surgical History: Past Surgical History:  Procedure Laterality Date   ABDOMINAL AORTIC ANEURYSM REPAIR  2011   CATARACT EXTRACTION W/PHACO Left 02/15/2021   Procedure: CATARACT EXTRACTION PHACO AND INTRAOCULAR LENS PLACEMENT (IOC) LEFT 5.59 00:44.0;  Surgeon: Myrna Adine Anes, MD;  Location: [facility];  Service: Ophthalmology;  Laterality: Left;   CATARACT EXTRACTION W/PHACO Right 03/08/2021   Procedure: CATARACT EXTRACTION PHACO AND INTRAOCULAR LENS PLACEMENT (IOC) RIGHT 1.81 00:22.7;  Surgeon: Myrna Adine Anes, MD;  Location: [facility];  Service: Ophthalmology;  Laterality: Right;   ESOPHAGOGASTRODUODENOSCOPY N/A 04/16/2024   Procedure: EGD (ESOPHAGOGASTRODUODENOSCOPY);  Surgeon: Jinny Carmine, MD;  Location: [facility] ENDOSCOPY;  Service: Endoscopy;  Laterality: N/A;   HERNIA REPAIR     17mo old   INSERTION OF MESH N/A 10/24/2014   Procedure: INSERTION OF MESH;  Surgeon: Deward Null III, MD;  Location: [facility] OR;  Service: General;  Laterality: N/A;   LAPAROTOMY N/A 08/24/2013   Procedure: EXPLORATORY LAPAROTOMY;  Surgeon: Deward GORMAN Null DOUGLAS, MD;  Location: [facility] OR;  Service: General;  Laterality: N/A;  Gram patch closure   litrotripsy  09/24/2013   NEPHROSTOMY  Nov. 2014   nephrostomy removal  Dec. 2014   PERIPHERALLY INSERTED CENTRAL CATHETER INSERTION     TONSILLECTOMY     UMBILICAL HERNIA REPAIR     50 yr ago   VENTRAL HERNIA REPAIR N/A 10/24/2014   Procedure: LAPAROSCOPIC ASSISTED VENTRAL HERNIA  REPAIR WITH MESH;  Surgeon: Deward Null III, MD;  Location: [facility] OR;  Service: General;  Laterality: N/A;   XI ROBOTIC ASSISTED INGUINAL HERNIA REPAIR WITH MESH Bilateral 11/17/2022   Procedure: XI ROBOTIC ASSISTED INGUINAL HERNIA REPAIR WITH MESH-Bilateral;  Surgeon: Desiderio Schanz, MD;  Location: [facility] ORS;  Service: General;  Laterality: Bilateral;     Family History: Family History  Problem Relation Age of Onset   CVA Father    Atrial fibrillation Sister      Social History: Social History   Socioeconomic History   Marital status: Married    Spouse name: Soil scientist   Number of children: Not on file   Years of education: Not on file   Highest education level: Not on file  Occupational History   Not on file  Tobacco Use   Smoking status: Every Day    Current packs/day: 1.00    Average packs/day: 1 pack/day for 40.0 years (40.0 ttl pk-yrs)    Types: Cigarettes    Passive exposure: Past   Smokeless tobacco: Never   Tobacco comments:    since age 63  Vaping Use   Vaping status: Never Used  Substance and Sexual Activity   Alcohol use: Yes    Alcohol/week: 3.0 standard drinks of alcohol    Types: 3 Glasses of wine per week   Drug use: No   Sexual activity: Not on file  Other Topics Concern   Not on file  Social History Narrative   Not on file   Social Drivers of Health   Financial Resource Strain: Low Risk  (07/19/2023)   Received from [facility]   Overall Financial Resource Strain (CARDIA)    Difficulty of Paying Living Expenses: Not hard  at all  Food Insecurity: No Food Insecurity (04/16/2024)   Hunger Vital Sign    Worried About Running Out of Food in the Last Year: Never true    Ran Out of Food in the Last Year: Never true  Transportation Needs: No Transportation Needs (04/16/2024)   PRAPARE - Administrator, Civil Service (Medical): No    Lack of Transportation (Non-Medical): No  Physical Activity: Not on file  Stress: Not on file   Social Connections: Unknown (04/17/2024)   Social Connection and Isolation Panel    Frequency of Communication with Friends and Family: Once a week    Frequency of Social Gatherings with Friends and Family: Twice a week    Attends Religious Services: Not on Marketing executive or Organizations: No    Attends Banker Meetings: 1 to 4 times per year    Marital Status: Married  Catering manager Violence: Not At Risk (04/16/2024)   Humiliation, Afraid, Rape, and Kick questionnaire    Fear of Current or Ex-Partner: No    Emotionally Abused: No    Physically Abused: No    Sexually Abused: No     Review of Systems: Review of Systems  Constitutional:  Negative for chills, fever and malaise/fatigue.  HENT:  Negative for congestion, sore throat and tinnitus.   Eyes:  Negative for blurred vision and redness.  Respiratory:  Positive for hemoptysis. Negative for cough, shortness of breath and wheezing.   Cardiovascular:  Negative for chest pain, palpitations, claudication and leg swelling.  Gastrointestinal:  Negative for abdominal pain, blood in stool, diarrhea, nausea and vomiting.  Genitourinary:  Negative for flank pain, frequency and hematuria.  Musculoskeletal:  Negative for back pain, falls and myalgias.  Skin:  Negative for rash.  Neurological:  Negative for dizziness, weakness and headaches.  Endo/Heme/Allergies:  Does not bruise/bleed easily.  Psychiatric/Behavioral:  Negative for depression. The patient is not nervous/anxious and does not have insomnia.     Vital Signs: Blood pressure 120/73, pulse (!) 49, temperature 98 F (36.7 C), temperature source Oral, resp. rate 14, height 5' 9 (1.753 m), weight 97.3 kg, SpO2 93%.  Weight trends: Filed Weights   04/15/24 2158 04/16/24 0500 04/17/24 0500  Weight: 94.1 kg 94.1 kg 97.3 kg    Physical Exam: General: NAD  Head: Normocephalic, atraumatic. Moist oral mucosal membranes  Eyes: Anicteric  Neck:  Supple  Lungs:  Coarse, wheezing throughout, NCO 2  Heart: Regular rate and rhythm  Abdomen:  Soft, nontender, nondistended  Extremities: Trace peripheral edema.  Neurologic: Awake and alert  Skin: No lesions  Access: None     Lab results: Basic Metabolic Panel: Recent Labs  Lab 04/15/24 1447 04/16/24 0339 04/17/24 0357  NA 140 142 140  K 4.2 4.2 4.4  CL 105 108 107  CO2 24 24 20*  GLUCOSE 94 96 102*  BUN 39* 43* 50*  CREATININE 2.43* 2.44* 3.08*  CALCIUM 9.4 8.8* 8.7*  MG  --  2.3 2.3  PHOS  --  3.9 4.3    Liver Function Tests: No results for input(s): AST, ALT, ALKPHOS, BILITOT, PROT, ALBUMIN  in the last 168 hours. No results for input(s): LIPASE, AMYLASE in the last 168 hours. No results for input(s): AMMONIA in the last 168 hours.  CBC: Recent Labs  Lab 04/15/24 1447 04/16/24 0339 04/17/24 0836  WBC 9.1 10.2 11.2*  NEUTROABS  --   --  8.9*  HGB 15.3 14.5 16.6  HCT 47.3 44.0 RESULTS UNAVAILABLE DUE TO INTERFERING SUBSTANCE  MCV 91.7 91.3 RESULTS UNAVAILABLE DUE TO INTERFERING SUBSTANCE  PLT 160 165 156    Cardiac Enzymes: No results for input(s): CKTOTAL, CKMB, CKMBINDEX, TROPONINI in the last 168 hours.  BNP: Invalid input(s): POCBNP  CBG: Recent Labs  Lab 04/15/24 2153  GLUCAP 100*    Microbiology: Results for orders placed or performed during the hospital encounter of 04/15/24  Blood culture (routine x 2)     Status: None (Preliminary result)   Collection Time: 04/15/24  8:55 PM   Specimen: BLOOD  Result Value Ref Range Status   Specimen Description BLOOD BLOOD RIGHT ARM  Final   Special Requests   Final    BOTTLES DRAWN AEROBIC AND ANAEROBIC Blood Culture adequate volume   Culture   Final    NO GROWTH 2 DAYS Performed at [facility], [address] [place], [place] [address]    Report Status PENDING  Incomplete  Blood culture (routine x 2)     Status: None (Preliminary result)   Collection  Time: 04/15/24  8:55 PM   Specimen: BLOOD  Result Value Ref Range Status   Specimen Description BLOOD BLOOD LEFT ARM  Final   Special Requests   Final    BOTTLES DRAWN AEROBIC AND ANAEROBIC Blood Culture results may not be optimal due to an inadequate volume of blood received in culture bottles   Culture   Final    NO GROWTH 2 DAYS Performed at [facility], [address] [place], [place] [address]    Report Status PENDING  Incomplete  MRSA Next Gen by PCR, Nasal     Status: None   Collection Time: 04/15/24 10:01 PM   Specimen: Nasal Mucosa; Nasal Swab  Result Value Ref Range Status   MRSA by PCR Next Gen NOT DETECTED NOT DETECTED Final    Comment: (NOTE) The GeneXpert MRSA Assay (FDA approved for NASAL specimens only), is one component of a comprehensive MRSA colonization surveillance program. It is not intended to diagnose MRSA infection nor to guide or monitor treatment for MRSA infections. Test performance is not FDA approved in patients less than 71 years old. Performed at [facility], [address] [place], [place] [address]   Culture, Respiratory w Gram Stain     Status: None (Preliminary result)   Collection Time: 04/16/24 11:42 AM   Specimen: SPU; Respiratory  Result Value Ref Range Status   Specimen Description   Final    SPUTUM Performed at [facility], [address] [place], [place] [address]    Special Requests   Final     EXPSU Performed at [facility], [address] [place], [place] [address]    Gram Stain   Final    ABUNDANT SQUAMOUS EPITHELIAL CELLS PRESENT MODERATE GRAM POSITIVE COCCI MODERATE GRAM NEGATIVE RODS    Culture   Final    CULTURE REINCUBATED FOR BETTER GROWTH Performed at [facility], [address] [place], [place] [address]    Report Status PENDING  Incomplete  Respiratory (~20 pathogens) panel by PCR     Status: None   Collection Time: 04/16/24 11:42 AM   Specimen: Expectorated  Sputum; Respiratory  Result Value Ref Range Status   Adenovirus NOT DETECTED NOT DETECTED Final   Coronavirus 229E NOT DETECTED NOT DETECTED Final    Comment: (NOTE) The Coronavirus on the Respiratory Panel, DOES NOT test for the novel  Coronavirus (2019  nCoV)    Coronavirus HKU1 NOT DETECTED NOT DETECTED Final   Coronavirus NL63 NOT DETECTED NOT DETECTED Final   Coronavirus OC43 NOT DETECTED NOT DETECTED Final   Metapneumovirus NOT DETECTED NOT DETECTED Final   Rhinovirus / Enterovirus NOT DETECTED NOT DETECTED Final   Influenza A NOT DETECTED NOT DETECTED Final   Influenza B NOT DETECTED NOT DETECTED Final   Parainfluenza Virus 1 NOT DETECTED NOT DETECTED Final   Parainfluenza Virus 2 NOT DETECTED NOT DETECTED Final   Parainfluenza Virus 3 NOT DETECTED NOT DETECTED Final   Parainfluenza Virus 4 NOT DETECTED NOT DETECTED Final   Respiratory Syncytial Virus NOT DETECTED NOT DETECTED Final   Bordetella pertussis NOT DETECTED NOT DETECTED Final   Bordetella Parapertussis NOT DETECTED NOT DETECTED Final   Chlamydophila pneumoniae NOT DETECTED NOT DETECTED Final   Mycoplasma pneumoniae NOT DETECTED NOT DETECTED Final    Comment: Performed at [facility], [address], [place], [place] [address]    Coagulation Studies: Recent Labs    04/15/24 1806  LABPROT 16.9*  INR 1.3*    Urinalysis: No results for input(s): COLORURINE, LABSPEC, PHURINE, GLUCOSEU, HGBUR, BILIRUBINUR, KETONESUR, PROTEINUR, UROBILINOGEN, NITRITE, LEUKOCYTESUR in the last 72 hours.  Invalid input(s): APPERANCEUR    Imaging: DG Abd 1 View Result Date: 04/17/2024 CLINICAL DATA:  Abdominal pain. EXAM: ABDOMEN - 1 VIEW COMPARISON:  September 16, 2013. FINDINGS: Mildly dilated small bowel loops are noted without colonic dilatation. Mild amount of stool seen throughout the colon. Status post stent graft repair of abdominal aorta. IMPRESSION: Mild small bowel dilatation is noted  concerning for distal small bowel obstruction or possibly ileus. Electronically Signed   By: Lynwood Landy Raddle M.D.   On: 04/17/2024 14:52   DG Chest Port 1 View Result Date: 04/17/2024 CLINICAL DATA:  Respiratory failure with hypoxia. EXAM: PORTABLE CHEST 1 VIEW COMPARISON:  April 15, 2024. FINDINGS: Stable cardiomediastinal silhouette. Mild central pulmonary vascular congestion is noted. Elevated right hemidiaphragm is noted with minimal right basilar subsegmental atelectasis. Bony thorax is unremarkable. IMPRESSION: Mild central pulmonary vascular congestion is noted. Elevated right hemidiaphragm with minimal right basilar subsegmental atelectasis. Electronically Signed   By: Lynwood Landy Raddle M.D.   On: 04/17/2024 14:50   CT Angio Chest/Abd/Pel for Dissection W and/or Wo Contrast Result Date: 04/15/2024 CLINICAL DATA:  Coughing up blood EXAM: CT ANGIOGRAPHY CHEST, ABDOMEN AND PELVIS TECHNIQUE: Non-contrast CT of the chest was initially obtained. Multidetector CT imaging through the chest, abdomen and pelvis was performed using the standard protocol during bolus administration of intravenous contrast. Multiplanar reconstructed images and MIPs were obtained and reviewed to evaluate the vascular anatomy. RADIATION DOSE REDUCTION: This exam was performed according to the departmental dose-optimization program which includes automated exposure control, adjustment of the mA and/or kV according to patient size and/or use of iterative reconstruction technique. CONTRAST:  80mL OMNIPAQUE  IOHEXOL  350 MG/ML SOLN COMPARISON:  CT 10/20/2022 FINDINGS: CTA CHEST FINDINGS Cardiovascular: Non contrasted images of the chest demonstrate no acute intramural hematoma. Negative for aortic dissection. Focal outpouching measuring 14 mm at the distal arch either representing prominent ductus diverticulum or aneurysm, but sludge least stable size compared with noncontrast CT from 2015. No extravasation identified within the chest.  Coronary vascular calcification. Normal cardiac size. No pericardial effusion. Mediastinum/Nodes: Patent trachea. No thyroid mass. No suspicious lymph nodes. Esophagus within normal limits. Calcified nodes consistent with prior granulomatous disease. Lungs/Pleura: No pleural effusion or pneumothorax. Patchy mild peribronchovascular ground-glass disease. No consolidation. Musculoskeletal: Sternum  appears intact. No acute osseous abnormality. Review of the MIP images confirms the above findings. CTA ABDOMEN AND PELVIS FINDINGS VASCULAR Aorta: Aortic atherosclerosis. Status post infrarenal abdominal aortoiliac stent graft. The proximal end of the stent is just below the level of the renal arteries. Aneurysmal dilatation of the abdominal aorta at the level of the renal arteries, slightly superior to the proximal end of the stent, this measures 4.6 x 3.7 cm on axial series 6, image 180, previously 4.2 by 3.2 cm. Saccular appearing component of the aneurysm at the level of the stent, coronal series 11, image 74, this appears increased in size compared to the non contrasted exam from January and there may be some soft tissue thickening along the saccular portion of the aneurysm on coronal series 11, image 73. No dissection is seen. 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 Possible small 5 mm aneurysm at the origin of the celiac artery, series 12, image 108. SMA: Mild-to-moderate stenosis of the proximal SMA just distal to the origin, series 12, image 111. Distal vessel patency without dissection. Renals: Accessory upper pole right renal artery with severe stenosis at the origin, series 11, image 80. 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 String like flow at the proximal right renal artery, series 11 image 75, consistent with severe stenosis over a 12 mm length. Small accessory upper pole renal artery on the left with suspected severe  stenosis at the origin. Dominant left renal artery with severe stenosis at the origin, series 6, image 172 and coronal series 11 image 74 and 75. IMA: Poorly visible, small vessel supplying the perirectal is traceable to the iliac stent supplying the left iliac vessel. 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 Aneurysmal dilatation of the right common iliac artery at the distal end of the stent measuring up to 2.8 cm on series 11, image 73. Multiple small aneurysms of the proximal internal and iliac vessels. No high-grade stenosis or occlusive disease of the distal external iliac vessels. Veins: Suboptimally assessed Review of the MIP images confirms the above findings. NON-VASCULAR Hepatobiliary: Small gallstone. No biliary dilatation. No focal hepatic abnormality. Pancreas: Unremarkable. No pancreatic ductal dilatation or surrounding inflammatory changes. Spleen: Multiple calcified granuloma Adrenals/Urinary Tract: Adrenal glands are within normal limits. Kidneys show no hydronephrosis. Multiple renal cysts for which no imaging follow-up is recommended. Slightly thick-walled urinary bladder. Indeterminate slightly dense exophytic lesion off the upper pole right kidney measuring 17 mm, series 6, image 168. Stomach/Bowel: The stomach is non enlarged. There is no dilated small bowel. No acute bowel wall thickening. Negative appendix. Diverticular disease of the colon. Lymphatic: No suspicious lymph nodes. Reproductive: Negative prostate Other: Negative for pelvic effusion or free air. Previously noted bilateral inguinal hernias are resolved/repair. Musculoskeletal: No acute or suspicious osseous abnormality. Review of the MIP images confirms the above findings. IMPRESSION: 1. Negative for acute aortic dissection or intramural hematoma. No extravasation identified within the chest, abdomen, or pelvis. 2. Status post infrarenal  abdominal aortoiliac stent graft. Aneurysmal dilatation of the abdominal aorta at the level of the renal arteries, slightly superior to the proximal end of the stent, this measures 4.6 x 3.7 cm, previously 4.2 x 3.2 cm. Saccular appearing component of the aneurysm at the level of the proximal stent appears increased in size compared to the non contrasted exam from January and  there may be some soft tissue thickening and stranding along the inferior aspect of the saccular part of the aneurysm raising concern for possible leakage or impending rupture, vascular surgery consultation is recommended. 3. 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 Aneurysmal dilatation of the right common iliac artery at the distal end of the stent measuring up to 2.8 cm. Multiple small aneurysms of the proximal internal and iliac vessels. 4. 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 There are small accessory upper pole renal arteries bilaterally with suspected severe stenosis at the origin of these vessels. 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 5. 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 Possible small 5 mm aneurysm at the origin of the celiac artery. 6. 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 Patchy mild peribronchovascular ground-glass disease within the lungs, suggestive of respiratory infection. No consolidative pneumonia. 8. Cholelithiasis. Diverticular disease of the colon. 9. 16 mm indeterminate exophytic lesion off the upper pole right kidney, could initially correlate with ultrasound 10. Slightly thick-walled urinary bladder, correlate for cystitis. 11. Interval bilateral inguinal hernia repair. 12. Aortic atherosclerosis. Aortic Atherosclerosis (ICD10-I70.0). Electronically Signed   By: Luke Bun M.D.   On: 04/15/2024 19:47      Assessment & Plan: Nathan Rivera is a 78 y.o.  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  Baseline appears to be creatinine 2.2 with GFR 30 on/22/25.  Acute kidney injury likely secondary to hypoperfusion due to bilateral renal artery stenosis.  CT angio chest/pelvis/abdomen show extensive vascular disease (see report).  Vascular surgery has been consulted for intervention.  Creatinine currently 3.08.  Tight blood pressure control per critical care team.  Would recommend IV hydration prior to any procedure needing IV contrast however patient has developed some respiratory distress.  Awaiting chest x-ray results.  IV fluids held at this time.  No acute indication for dialysis.  Will continue to monitor.  2. Anemia of chronic kidney disease Lab Results  Component Value Date   HGB 16.6 04/17/2024    Hemoglobin acceptable for now.  Will continue to monitor  3.  Hypertension with chronic kidney disease.  Home regimen includes amlodipine .  Receiving this medication along with clevidipine , esmolol , and hydralazine .  4. Secondary Hyperparathyroidism: with outpatient labs: None available  Lab Results  Component Value Date   CALCIUM 8.7 (L) 04/17/2024   PHOS 4.3 04/17/2024    Will continue to monitor bone minerals during this admission.   LOS: 2 Faith Harris 7/23/20254:16 PM

## 2024-04-17 NOTE — Progress Notes (Signed)
 NAME:  Nathan Rivera, MRN:  [id number], DOB:  10-20-1945, LOS: 2 ADMISSION DATE:  04/15/2024, CHIEF COMPLAINT:  Hemoptysis   History of Present Illness:   78 y.o male with significant PMH of Infrarenal abdominal aortic aneurysm s/p endovascular repair of a ruptured AAA in 2011 on Xarelto , PAD, HTN, duodenal ulcer with perforation s/p omental patch repair, CKD stage III, thrombocytopenia, HDL, prior tobacco use, ESBL bacteremia, bladder cancer, chronic back pain, and bilateral inguinal hernia s/p repair who presented to the ED with chief complaints of abdominal pain and hemoptysis.   Patient and family at bedside reports that he took his last dose of Xarelto  yesterday evening.  He subsequently developed some abdominal pain around the epigastric and left upper quadrant region.  Since onset of symptoms has been coughing up bright red blood which he describes  blood filling up his mouth.  Denies other associated symptoms   ED Course: Initial vital signs showed HR of 68 beats/minute, BP 192/102 mm Hg, the RR 18 breaths/minute, and the oxygen saturation 93% on RA and a temperature of 98.71F (36.7C). Pertinent Labs/Diagnostics Findings: Na+/ K+:140/4.2 Glucose:94  BUN/Cr.:39/2.43 TAR:Lwmzfjmxjaoz  PCT: negative <0.10  BNP: 394.5  CTA Chest> CT Abd/pelvis> Saccular aneurysm at the level of the proximal stent with some soft tissue thickening and stranding concerning possible endoleak or impending rupture,Large aneurysm involving the left common iliac artery, partially encompasses the left iliac stent, Aneurysmal dilatation of the right common iliac artery at the distal end of the stent  Multiple small aneurysms of the proximal internal and iliac vessels.    CT findings discussed with on-call vascular Dr. Marea who recommended hemodynamic monitoring as it is unlikely patient will require emergent intervention but may require repair during the course of admission. PCCM consulted for admission, BP management  and close monitoring.  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 Aorto bifemoral endograft placement for AAA,  Chronic right-sided low back pain without sciatica;  Thrombocytopenia  Raynaud's disease with gangrene  B12 deficiency;  Personal history of malignant neoplasm of bladder;  Tobacco use disorder;  Hematuria, gross  ESBL Bacteremia due to septic shock due to obstructive stone with Pyelonephritis bilateral Inguinal Hernia s/p repair (02/24)  Significant Hospital Events: Including procedures, antibiotic start and stop dates in addition to other pertinent events   7/21: admit to ICU, on clevidipine  and esmolol  7/22: no recurrence of hemoptysis, but he continues to have a cough. EGD normal without any PUD or sign of GI bleed 7/23: worsening renal function, holding on surgical intervention  Interim History / Subjective:  Resting comfortably in bed, no acute complaints. Discussed updates with family at bedside.  Objective    Blood pressure 126/72, pulse (!) 52, temperature 98.1 F (36.7 C), temperature source Axillary, resp. rate 19, height 5' 9 (1.753 m), weight 97.3 kg, SpO2 99%.        Intake/Output Summary (Last 24 hours) at 04/17/2024 1414 Last data filed at 04/17/2024 1130 Gross per 24 hour  Intake 2666.86 ml  Output 350 ml  Net 2316.86 ml   Filed Weights   04/15/24 2158 04/16/24 0500 04/17/24 0500  Weight: 94.1 kg 94.1 kg 97.3 kg    Examination: Physical Exam Constitutional:      Appearance: He is not ill-appearing.  Cardiovascular:     Rate and Rhythm:  Normal rate and regular rhythm.     Pulses: Normal pulses.     Heart sounds: Normal heart sounds.  Pulmonary:     Effort: Pulmonary effort is normal.     Breath sounds: Normal breath sounds. No  rales.  Neurological:     General: No focal deficit present.     Mental Status: He is alert and oriented to person, place, and time. Mental status is at baseline.     Assessment and Plan   #Hemoptysis #Endoleak with Aneurysm Expansion #History of Infrarenal AAA s/p endovascular repeair #HTN emergency #AKI on CKD #Community Acquired Pneumonia #Mild Cognitive Impairment  78 year old male presenting with an episode of hemoptysis (golf ball sized) with concern for hematemesis given history of GI bleed in the past. Patient was found to have a possible endoleak with threat of rupture and is admitted to the ICU for tight blood pressure control.  Neuro - history of mild cognitive impairment, holding memantine and donepezil  given bradycardia with esmolol . CV - history of hypertensive, and presented with elevated blood pressure. Given concern for endovascular leak and risk of AAA rupture, we started him on strict BP and heart rate control with esmolol  (goal HR < 60) and clevidipine  (SBP 100-120). Consulted with vascular surgery for evaluation for endovascular repair which was initially planned for today. He does have significant vascular disease (bilateral severe renal artery stenosis, severe celiac artery stenosis, infrarenal abdominal aortoiliac stent graft, aneurysmal dilation of the abdominal aorta at the level of the renal arteries with a saccular component concerning for leakage vs impending rupture, left common iliac artery aneurysm). Given renal disease and need for contrast, this will be delayed pending renal recovery, with consideration of renal artery intervention. We have to balance need to lower blood pressure (to decrease risk of leak/rupture) with normotension to allow for renal perfusion given severe renal artery stenosis. His renal artery stenosis puts him at risk for flash pulmonary edema.  -goal SBP < 110-130  -goal HR < 60 Pulm - I have personally reviewed his chest CT and note  multiple tree in bud nodules as well as minimal ground glass opacities that were scattered, concerning for viral infection versus bacterial pneumonia/bronchitis. He is on nasal cannula, and given fluid intake and renal dysfunction is at risk for pulmonary edema. Will repeat chest xray today. Antibiotics and infectious workup per ID section. There has been no recurrence of hemoptysis. GI - Underwent EGD that did not show any PUD or sign of GI bleed. Will discontinue high dose PPI. Resumed diet. Will obtain KUB today to assess for any ileus. Renal - AKI on CKD, with bilateral renal artery stenosis. This was likely exacerbated by contrast exposure. Will hold IV fluids for now given increase in oxygen requirements. We have to balance his blood pressure requirements (need to lower to decrease risk of aneurysmal rupture vs need for BP to perfuse the kidneys). Will liberalize his BP goal to 130. Endo - ICU glycemic protocol Hem/Onc - on home rivaroxaban , holding given hemoptysis. H/H stable ID - Viral panel negative, on Ceftriaxone  and Azithromycin  for CAP coverage. Sputum sample sent and cultures pending.   Best Practice (right click and Reselect all SmartList Selections daily)   Diet/type: Regular consistency (see orders) DVT prophylaxis not indicated Pressure ulcer(s): N/A GI prophylaxis: N/A Lines: N/A Foley:  N/A Code Status:  full code Last date of multidisciplinary goals of care discussion [04/17/2024]  Labs   CBC: Recent Labs  Lab 04/15/24 1447 04/16/24  9660 04/17/24 0836  WBC 9.1 10.2 11.2*  NEUTROABS  --   --  8.9*  HGB 15.3 14.5 16.6  HCT 47.3 44.0 RESULTS UNAVAILABLE DUE TO INTERFERING SUBSTANCE  MCV 91.7 91.3 RESULTS UNAVAILABLE DUE TO INTERFERING SUBSTANCE  PLT 160 165 156    Basic Metabolic Panel: Recent Labs  Lab 04/15/24 1447 04/16/24 0339 04/17/24 0357  NA 140 142 140  K 4.2 4.2 4.4  CL 105 108 107  CO2 24 24 20*  GLUCOSE 94 96 102*  BUN 39* 43* 50*   CREATININE 2.43* 2.44* 3.08*  CALCIUM 9.4 8.8* 8.7*  MG  --  2.3 2.3  PHOS  --  3.9 4.3   GFR: Estimated Creatinine Clearance: 22.7 mL/min (A) (by C-G formula based on SCr of 3.08 mg/dL (H)). Recent Labs  Lab 04/15/24 1447 04/15/24 2030 04/15/24 2218 04/16/24 0339 04/17/24 0836  PROCALCITON  --  <0.10  --  <0.10  --   WBC 9.1  --   --  10.2 11.2*  LATICACIDVEN  --  0.9 1.2  --   --     Liver Function Tests: No results for input(s): AST, ALT, ALKPHOS, BILITOT, PROT, ALBUMIN  in the last 168 hours. No results for input(s): LIPASE, AMYLASE in the last 168 hours. No results for input(s): AMMONIA in the last 168 hours.  ABG    Component Value Date/Time   PHART 7.273 (L) 08/24/2013 2337   PCO2ART 28.9 (L) 08/24/2013 2337   PO2ART 84.0 08/24/2013 2337   HCO3 13.4 (L) 08/24/2013 2337   TCO2 14 08/24/2013 2337   ACIDBASEDEF 12.0 (H) 08/24/2013 2337   O2SAT 95.0 08/24/2013 2337     Coagulation Profile: Recent Labs  Lab 04/15/24 1806  INR 1.3*    Cardiac Enzymes: No results for input(s): CKTOTAL, CKMB, CKMBINDEX, TROPONINI in the last 168 hours.  HbA1C: No results found for: HGBA1C  CBG: Recent Labs  Lab 04/15/24 2153  GLUCAP 100*    Review of Systems:   N/A  Past Medical History:  He,  has a past medical history of Alzheimer dementia (HCC), Aortic aneurysm (HCC), Arthritis, Cancer (HCC) (2012), CKD (chronic kidney disease), stage III (HCC) (2014), Degenerative disc disease, Degenerative joint disease, GERD (gastroesophageal reflux disease), History of kidney stones, Hypercholesterolemia, Hypertension, Impaired glucose tolerance, Muscle fatigue, Peripheral vascular disease (HCC), Pneumonia, Raynaud's disease, Sepsis (HCC) (07/2013), Stroke [facility]), Thromboembolism of left popilteal vein (2015), and UTI (urinary tract infection) (08/17/2013).   Surgical History:   Past Surgical History:  Procedure Laterality Date   ABDOMINAL AORTIC  ANEURYSM REPAIR  2011   CATARACT EXTRACTION W/PHACO Left 02/15/2021   Procedure: CATARACT EXTRACTION PHACO AND INTRAOCULAR LENS PLACEMENT (IOC) LEFT 5.59 00:44.0;  Surgeon: Myrna Adine Anes, MD;  Location: [facility];  Service: Ophthalmology;  Laterality: Left;   CATARACT EXTRACTION W/PHACO Right 03/08/2021   Procedure: CATARACT EXTRACTION PHACO AND INTRAOCULAR LENS PLACEMENT (IOC) RIGHT 1.81 00:22.7;  Surgeon: Myrna Adine Anes, MD;  Location: [facility];  Service: Ophthalmology;  Laterality: Right;   ESOPHAGOGASTRODUODENOSCOPY N/A 04/16/2024   Procedure: EGD (ESOPHAGOGASTRODUODENOSCOPY);  Surgeon: Jinny Carmine, MD;  Location: [facility] END[facility]COPY;  Service: Endoscopy;  Laterality: N/A;   HERNIA REPAIR     57mo old   INSERTION OF MESH N/A 10/24/2014   Procedure: INSERTION OF MESH;  Surgeon: Deward Null III, MD;  Location: [facility] OR;  Service: General;  Laterality: N/A;   LAPAROTOMY N/A 08/24/2013   Procedure: EXPLORATORY LAPAROTOMY;  Surgeon: Deward GORMAN Null DOUGLAS, MD;  Location: [facility] OR;  Service: General;  Laterality: N/A;  Gram patch closure   litrotripsy  09/24/2013   NEPHROSTOMY  Nov. 2014   nephrostomy removal  Dec. 2014   PERIPHERALLY INSERTED CENTRAL CATHETER INSERTION     TONSILLECTOMY     UMBILICAL HERNIA REPAIR     50 yr ago   VENTRAL HERNIA REPAIR N/A 10/24/2014   Procedure: LAPAROSCOPIC ASSISTED VENTRAL HERNIA REPAIR WITH MESH;  Surgeon: Deward Null III, MD;  Location: [facility] OR;  Service: General;  Laterality: N/A;   XI ROBOTIC ASSISTED INGUINAL HERNIA REPAIR WITH MESH Bilateral 11/17/2022   Procedure: XI ROBOTIC ASSISTED INGUINAL HERNIA REPAIR WITH MESH-Bilateral;  Surgeon: Desiderio Schanz, MD;  Location: [facility] ORS;  Service: General;  Laterality: Bilateral;     Social History:   reports that he has been smoking cigarettes. He has a 40 pack-year smoking history. He has been exposed to tobacco smoke. He has never used smokeless tobacco. He reports current alcohol use of about 3.0 standard  drinks of alcohol per week. He reports that he does not use drugs.   Family History:  His family history includes Atrial fibrillation in his sister; CVA in his father.   Allergies Allergies  Allergen Reactions   Duloxetine Dermatitis     Home Medications  Prior to Admission medications   Medication Sig Start Date End Date Taking? Authorizing Provider  acetaminophen  (TYLENOL ) 325 MG tablet Take 325 mg by mouth every 6 (six) hours as needed for mild pain.   Yes [provider]  amLODipine  (NORVASC ) 5 MG tablet TAKE 1 TABLET BY MOUTH  DAILY 12/24/19  Yes Brown, Fallon E, NP  cholecalciferol (VITAMIN D) 1000 UNITS tablet Take 1,000 Units by mouth daily.   Yes [provider]  Cyanocobalamin (VITAMIN B-12 ER PO) Take by mouth.   Yes [provider]  donepezil  (ARICEPT ) 10 MG tablet Take 10 mg by mouth at bedtime.   Yes [provider]  FOLIC ACID PO Take by mouth.   Yes [provider]  gabapentin  (NEURONTIN ) 100 MG capsule 200 mg 2 (two) times daily. 200 mg in am and 200 mg in PM 04/01/17  Yes [provider]  Lactobacillus ([employer] PROBIOTIC ACIDOPHILUS) 10 MG CAPS Take by mouth.   Yes [provider]  memantine (NAMENDA) 10 MG tablet TAKE 1 TABLET BY MOUTH TWICE DAILY FOR MEMORY LOSS 02/28/24  Yes [provider]  pantoprazole  (PROTONIX ) 40 MG tablet Take 40 mg by mouth at bedtime.  11/13/13  Yes [provider]  simvastatin  (ZOCOR ) 20 MG tablet Take 20 mg by mouth at bedtime.   Yes [provider]  vitamin B-12 (CYANOCOBALAMIN) 500 MCG tablet Take 500 mcg by mouth daily.   Yes [provider]  XARELTO  20 MG TABS tablet daily.  12/23/13  Yes [provider]  amoxicillin -clavulanate (AUGMENTIN ) 875-125 MG tablet :1 Tablet(s) By Mouth Every 12 Hours Patient not taking: Reported on 04/15/2024 02/20/24   [provider]  pregabalin (LYRICA) 100 MG capsule Take 100 mg by mouth 2 (two) times  daily. Patient not taking: Reported on 04/15/2024 12/20/23   [provider]     The patient is critically ill due to abdominal aortic aneursym, endovascular leak, AKI.  Critical care was necessary to treat or prevent imminent or life-threatening deterioration. Critical care time was spent by me on the following activities: development of a treatment plan with the patient and/or surrogate as well as nursing, discussions with consultants, evaluation of  the patient's response to treatment, examination of the patient, obtaining a history from the patient or surrogate, ordering and performing treatments and interventions, ordering and review of laboratory studies, ordering and review of radiographic studies, review of telemetry data including pulse oximetry, re-evaluation of patient's condition and participation in multidisciplinary rounds.   I personally spent 35 minutes providing critical care not including any separately billable procedures.   Belva November, MD [facility] 04/17/2024 2:25 PM

## 2024-04-17 NOTE — Anesthesia Preprocedure Evaluation (Deleted)
 Anesthesia Evaluation  Patient identified by MRN, date of birth, ID band Patient awake and Patient confused    Reviewed: Allergy & Precautions, NPO status , Patient's Chart, lab work & pertinent test results  Airway Mallampati: III  TM Distance: >3 FB Neck ROM: full    Dental  (+) Caps   Pulmonary COPD, Current Smoker and Patient abstained from smoking.   Pulmonary exam normal  + decreased breath sounds      Cardiovascular Exercise Tolerance: Poor hypertension, Pt. on medications + Peripheral Vascular Disease and + DOE  Normal cardiovascular exam Rhythm:Regular Rate:Bradycardia  On esmolol  drip to control BP   Neuro/Psych  PSYCHIATRIC DISORDERS     Dementia CVA, No Residual Symptoms    GI/Hepatic Neg liver ROS, PUD,GERD  Medicated,,  Endo/Other  negative endocrine ROS    Renal/GU Renal diseaseAKI on CKD with renal artery stenosis  negative genitourinary   Musculoskeletal  (+) Arthritis ,    Abdominal Normal abdominal exam  (+)   Peds  Hematology negative hematology ROS (+)   Anesthesia Other Findings 7/21:Admit with Endoleak post Endovascular aneurysm repair (EVAR) and hypertensive crisis requiring cleviprex  and esmolol  gtt  Hypertensive Crisis #Hx of poorly controlled HTN #HLD #Bradycardia at baseline-Hold Aricept  BP on admission SBP>200 without end-organ dysfunction   78 y.o. male presenting with abdominal pain and hemoptysis. PMH significant for Infrarenal abdominal aortic aneurysm s/p endovascular repair of a ruptured AAA in 2011 on Xarelto , PAD, HTN, duodenal ulcer with perforation s/p omental patch repair, CKD stage III, thrombocytopenia, HDL, prior tobacco use, bladder cancer, chronic back pain.   CTA Chest> CT Abd/pelvis> Saccular aneurysm at the level of the proximal stent with some soft tissue thickening and stranding concerning possible endoleak or impending rupture,Large aneurysm involving the left  common iliac artery, partially encompasses the left iliac stent, Aneurysmal dilatation of the right common iliac artery at the distal end of the stent  Multiple small aneurysms of the proximal internal and iliac vessels.  EGD 7/22: EGD without any sign of active or past bleeding. No ulcers or inflammation seen. Consider other source of bleeding.  Past Medical History: No date: Alzheimer dementia (HCC) No date: Aortic aneurysm (HCC) No date: Arthritis     Comment:  knees are the biggest problem 2012: Cancer (HCC)     Comment:  bladder 2014: CKD (chronic kidney disease), stage III (HCC)     Comment:  relative to infection, septic- renal calculi No date: Degenerative disc disease No date: Degenerative joint disease No date: GERD (gastroesophageal reflux disease) No date: History of kidney stones No date: Hypercholesterolemia No date: Hypertension     Comment:  no meds currently No date: Impaired glucose tolerance No date: Muscle fatigue     Comment:  muscle fatigue relative to sepsis experience  No date: Peripheral vascular disease (HCC) No date: Pneumonia No date: Raynaud's disease 07/2013: Sepsis (HCC) No date: Stroke [facility]) 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 unsure which leg, treating with Xarelto  08/17/2013: UTI (urinary tract infection)     Comment:  Lt nephrostomy tube and stent placement  Past Surgical History: 2011: ABDOMINAL AORTIC ANEURYSM REPAIR 02/15/2021: CATARACT EXTRACTION W/PHACO; Left     Comment:  Procedure: CATARACT EXTRACTION PHACO AND INTRAOCULAR               LENS PLACEMENT (IOC) LEFT 5.59 00:44.0;  Surgeon: Myrna Adine Anes,  MD;  Location: [facility];                Service: Ophthalmology;  Laterality: Left; 03/08/2021: CATARACT EXTRACTION W/PHACO; Right     Comment:  Procedure: CATARACT EXTRACTION PHACO AND INTRAOCULAR               LENS PLACEMENT (IOC) RIGHT 1.81 00:22.7;  Surgeon: Myrna Adine Anes, MD;  Location: [facility];                Service: Ophthalmology;  Laterality: Right; No date: HERNIA REPAIR     Comment:  6mo old 10/24/2014: INSERTION OF MESH; N/A     Comment:  Procedure: INSERTION OF MESH;  Surgeon: Deward Null III,               MD;  Location: [facility];  Service: General;  Laterality:               N/A; 08/24/2013: LAPAROTOMY; N/A     Comment:  Procedure: EXPLORATORY LAPAROTOMY;  Surgeon: Deward GORMAN Null DOUGLAS, MD;  Location: [facility];  Service: General;                Laterality: N/A;  Gram patch closure 09/24/2013: litrotripsy Nov. 2014: NEPHROSTOMY Dec. 2014: nephrostomy removal No date: PERIPHERALLY INSERTED CENTRAL CATHETER INSERTION No date: TONSILLECTOMY No date: UMBILICAL HERNIA REPAIR     Comment:  50 yr ago 10/24/2014: VENTRAL HERNIA REPAIR; N/A     Comment:  Procedure: LAPAROSCOPIC ASSISTED VENTRAL HERNIA REPAIR               WITH MESH;  Surgeon: Deward Null III, MD;  Location: [facility];              Service: General;  Laterality: N/A; 11/17/2022: XI ROBOTIC ASSISTED INGUINAL HERNIA REPAIR WITH MESH;  Bilateral     Comment:  Procedure: XI ROBOTIC ASSISTED INGUINAL HERNIA REPAIR               WITH MESH-Bilateral;  Surgeon: Desiderio Schanz, MD;                Location: [facility] ORS;  Service: General;  Laterality:               Bilateral;  BMI    Body Mass Index: 30.64 kg/m      Reproductive/Obstetrics negative OB ROS                              Anesthesia Physical Anesthesia Plan  ASA: 3  Anesthesia Plan: General   Post-op Pain Management:    Induction: Intravenous  PONV Risk Score and Plan: Dexamethasone  and Ondansetron   Airway Management Planned: Oral ETT  Additional Equipment:   Intra-op Plan:   Post-operative Plan: Extubation in OR  Informed Consent:      Dental Advisory Given  Plan Discussed with: CRNA  Anesthesia Plan Comments:          Anesthesia Quick Evaluation

## 2024-04-17 NOTE — Progress Notes (Signed)
 Patient transported to CT and back with portable cardiac monitor in place by this nurse without complication.

## 2024-04-17 NOTE — Consult Note (Signed)
 PHARMACY CONSULT NOTE - ELECTROLYTES  Pharmacy Consult for Electrolyte Monitoring and Replacement   Recent Labs: Height: 5' 9 (175.3 cm) Weight: 97.3 kg (214 lb 8.1 oz) IBW/kg (Calculated) : 70.7 Estimated Creatinine Clearance: 22.7 mL/min (A) (by C-G formula based on SCr of 3.08 mg/dL (H)). Potassium (mmol/L)  Date Value  04/17/2024 4.4  10/23/2014 4.1   Magnesium (mg/dL)  Date Value  92/76/7974 2.3   Calcium (mg/dL)  Date Value  92/76/7974 8.7 (L)   Calcium, Total (mg/dL)  Date Value  98/71/7983 9.0   Albumin  (g/dL)  Date Value  98/74/7975 4.1  08/18/2013 1.4 (L)   Phosphorus (mg/dL)  Date Value  92/76/7974 4.3   Sodium (mmol/L)  Date Value  04/17/2024 140  10/23/2014 144    Assessment  Nathan Rivera is a 78 y.o. male presenting with abdominal pain and hemoptysis. PMH significant for Infrarenal abdominal aortic aneurysm s/p endovascular repair of a ruptured AAA in 2011 on Xarelto , PAD, HTN, duodenal ulcer with perforation s/p omental patch repair, CKD stage III, thrombocytopenia, HDL, prior tobacco use, ESBL bacteremia, bladder cancer, chronic back pain, and bilateral inguinal hernia s/p repair . Pharmacy has been consulted to monitor and replace electrolytes.  Diet: NPO MIVF: N/A Pertinent medications: clevidipine  gtt, esmolol  gtt  Goal of Therapy: Electrolytes WNL  Plan:  No replacement currently indicated Check BMP, Mg, Phos with AM labs  Thank you for allowing pharmacy to be a part of this patient's care.  Laporsha Grealish A Sabrinia Prien, PharmD Clinical Pharmacist 04/17/2024 7:49 AM

## 2024-04-17 NOTE — Plan of Care (Signed)

## 2024-04-17 NOTE — Progress Notes (Signed)
 Patient accepted from [facility].  Upon assessment patient noted to have expiratory wheezes with rhonchi bilaterally.  Patient on 6L nasal cannula with 02 SATs 90-92% resting in bed with no respiratory distress noted.  Patient with no BM since 04/15/24. Abdomen soft, hypoactive and patient complaining of tenderness in right and left lower quadrants. Dr. Isadora notified and order obtained for CXR and KUB.

## 2024-04-18 ENCOUNTER — Inpatient Hospital Stay

## 2024-04-18 DIAGNOSIS — Z9889 Other specified postprocedural states: Secondary | ICD-10-CM

## 2024-04-18 DIAGNOSIS — I701 Atherosclerosis of renal artery: Secondary | ICD-10-CM

## 2024-04-18 DIAGNOSIS — N179 Acute kidney failure, unspecified: Secondary | ICD-10-CM | POA: Diagnosis not present

## 2024-04-18 DIAGNOSIS — T82330A Leakage of aortic (bifurcation) graft (replacement), initial encounter: Secondary | ICD-10-CM | POA: Diagnosis not present

## 2024-04-18 DIAGNOSIS — I169 Hypertensive crisis, unspecified: Secondary | ICD-10-CM | POA: Diagnosis not present

## 2024-04-18 DIAGNOSIS — T82310A Breakdown (mechanical) of aortic (bifurcation) graft (replacement), initial encounter: Secondary | ICD-10-CM | POA: Diagnosis not present

## 2024-04-18 DIAGNOSIS — N189 Chronic kidney disease, unspecified: Secondary | ICD-10-CM | POA: Diagnosis not present

## 2024-04-18 DIAGNOSIS — R042 Hemoptysis: Secondary | ICD-10-CM | POA: Diagnosis not present

## 2024-04-18 LAB — CBC WITH DIFFERENTIAL/PLATELET
Abs Immature Granulocytes: 0.04 K/uL (ref 0.00–0.07)
Basophils Absolute: 0 K/uL (ref 0.0–0.1)
Basophils Relative: 0 %
Eosinophils Absolute: 0.3 K/uL (ref 0.0–0.5)
Eosinophils Relative: 3 %
HCT: 42.5 % (ref 39.0–52.0)
Hemoglobin: 14.3 g/dL (ref 13.0–17.0)
Immature Granulocytes: 0 %
Lymphocytes Relative: 12 %
Lymphs Abs: 1.3 K/uL (ref 0.7–4.0)
MCH: 30.5 pg (ref 26.0–34.0)
MCHC: 33.6 g/dL (ref 30.0–36.0)
MCV: 90.6 fL (ref 80.0–100.0)
Monocytes Absolute: 0.7 K/uL (ref 0.1–1.0)
Monocytes Relative: 7 %
Neutro Abs: 8.7 K/uL — ABNORMAL HIGH (ref 1.7–7.7)
Neutrophils Relative %: 78 %
Platelets: 144 K/uL — ABNORMAL LOW (ref 150–400)
RBC: 4.69 MIL/uL (ref 4.22–5.81)
RDW: 15.3 % (ref 11.5–15.5)
WBC: 11 K/uL — ABNORMAL HIGH (ref 4.0–10.5)
nRBC: 0 % (ref 0.0–0.2)

## 2024-04-18 LAB — BASIC METABOLIC PANEL WITH GFR
Anion gap: 11 (ref 5–15)
BUN: 50 mg/dL — ABNORMAL HIGH (ref 8–23)
CO2: 21 mmol/L — ABNORMAL LOW (ref 22–32)
Calcium: 8.4 mg/dL — ABNORMAL LOW (ref 8.9–10.3)
Chloride: 107 mmol/L (ref 98–111)
Creatinine, Ser: 3.2 mg/dL — ABNORMAL HIGH (ref 0.61–1.24)
GFR, Estimated: 19 mL/min — ABNORMAL LOW (ref >60)
Glucose, Bld: 88 mg/dL (ref 70–99)
Potassium: 4.1 mmol/L (ref 3.5–5.1)
Sodium: 139 mmol/L (ref 135–145)

## 2024-04-18 LAB — MAGNESIUM: Magnesium: 2.1 mg/dL (ref 1.7–2.4)

## 2024-04-18 LAB — CULTURE, RESPIRATORY W GRAM STAIN: Culture: NORMAL

## 2024-04-18 LAB — PHOSPHORUS: Phosphorus: 5.1 mg/dL — ABNORMAL HIGH (ref 2.5–4.6)

## 2024-04-18 LAB — PROTIME-INR
INR: 1.2 (ref 0.8–1.2)
Prothrombin Time: 16.2 s — ABNORMAL HIGH (ref 11.4–15.2)

## 2024-04-18 MED ORDER — ACETAMINOPHEN 10 MG/ML IV SOLN
1000.0000 mg | Freq: Four times a day (QID) | INTRAVENOUS | Status: AC
  Administered 2024-04-18 – 2024-04-19 (×4): 1000 mg via INTRAVENOUS
  Filled 2024-04-18 (×5): qty 100

## 2024-04-18 MED ORDER — OXYCODONE HCL 5 MG PO TABS
5.0000 mg | ORAL_TABLET | Freq: Four times a day (QID) | ORAL | Status: DC | PRN
  Administered 2024-04-18: 5 mg via ORAL
  Filled 2024-04-18: qty 1

## 2024-04-18 MED ORDER — HYDRALAZINE HCL 50 MG PO TABS
50.0000 mg | ORAL_TABLET | Freq: Three times a day (TID) | ORAL | Status: DC
  Administered 2024-04-18 – 2024-04-24 (×18): 50 mg via ORAL
  Filled 2024-04-18 (×18): qty 1

## 2024-04-18 MED ORDER — ONDANSETRON HCL 4 MG/2ML IJ SOLN
4.0000 mg | Freq: Four times a day (QID) | INTRAMUSCULAR | Status: DC | PRN
  Administered 2024-04-18: 4 mg via INTRAVENOUS
  Filled 2024-04-18: qty 2

## 2024-04-18 NOTE — Progress Notes (Signed)
 [facility]  ROUNDING NOTE   Subjective:   Patient with past medical history of peripheral arterial disease, hypertension, abdominal aortic aneurysm, hyperlipidemia, bladder cancer, thrombocytopenia who was admitted with hypertensive crisis, abdominal aneurysm, type I endoleak of aortic graft.  Update: Renal function about the same today with a creatinine of 3.2 and EGFR of 19. IV fluids were attempted the patient became shortness of breath shortly after starting them. Therefore these had to be held. Vascular surgery considering renal angiogram with stent placement tomorrow.  Objective:  Vital signs in last 24 hours:  Temp:  [97.8 F (36.6 C)-98 F (36.7 C)] 97.8 F (36.6 C) (07/24 1200) Pulse Rate:  [25-69] 64 (07/24 1315) Resp:  [12-21] 17 (07/24 1315) BP: (110-156)/(56-108) 135/70 (07/24 1315) SpO2:  [84 %-99 %] 95 % (07/24 1315) Weight:  [98.8 kg] 98.8 kg (07/24 0600)  Weight change: 1.5 kg Filed Weights   04/16/24 0500 04/17/24 0500 04/18/24 0600  Weight: 94.1 kg 97.3 kg 98.8 kg    Intake/Output: I/O last 3 completed shifts: In: 3806.6 [I.V.:3256.6; IV Piggyback:550] Out: 650 [Urine:650]   Intake/Output this shift:  Total I/O In: 612 [P.O.:65; I.V.:126.6; IV Piggyback:420.5] Out: 200 [Urine:200]  Physical Exam: General: No acute distress  Head: Normocephalic, atraumatic. Moist oral mucosal membranes  Neck: Supple  Lungs:  Scattered rhonchi, normal effort  Heart: S1S2 no rubs  Abdomen:  Soft, nontender, bowel sounds present  Extremities: Trace peripheral edema.  Neurologic: Awake, alert, following commands  Skin: No acute rash  Access: No hemodialysis access    Basic Metabolic Panel: Recent Labs  Lab 04/15/24 1447 04/16/24 0339 04/17/24 0357 04/18/24 0324  NA 140 142 140 139  K 4.2 4.2 4.4 4.1  CL 105 108 107 107  CO2 24 24 20* 21*  GLUCOSE 94 96 102* 88  BUN 39* 43* 50* 50*  CREATININE 2.43* 2.44* 3.08* 3.20*  CALCIUM 9.4 8.8*  8.7* 8.4*  MG  --  2.3 2.3 2.1  PHOS  --  3.9 4.3 5.1*    Liver Function Tests: No results for input(s): AST, ALT, ALKPHOS, BILITOT, PROT, ALBUMIN  in the last 168 hours. No results for input(s): LIPASE, AMYLASE in the last 168 hours. No results for input(s): AMMONIA in the last 168 hours.  CBC: Recent Labs  Lab 04/15/24 1447 04/16/24 0339 04/17/24 0836 04/18/24 0324  WBC 9.1 10.2 11.2* 11.0*  NEUTROABS  --   --  8.9* 8.7*  HGB 15.3 14.5 16.6 14.3  HCT 47.3 44.0 RESULTS UNAVAILABLE DUE TO INTERFERING SUBSTANCE 42.5  MCV 91.7 91.3 RESULTS UNAVAILABLE DUE TO INTERFERING SUBSTANCE 90.6  PLT 160 165 156 144*    Cardiac Enzymes: No results for input(s): CKTOTAL, CKMB, CKMBINDEX, TROPONINI in the last 168 hours.  BNP: Invalid input(s): POCBNP  CBG: Recent Labs  Lab 04/15/24 2153  GLUCAP 100*    Microbiology: Results for orders placed or performed during the hospital encounter of 04/15/24  Blood culture (routine x 2)     Status: None (Preliminary result)   Collection Time: 04/15/24  8:55 PM   Specimen: BLOOD  Result Value Ref Range Status   Specimen Description BLOOD BLOOD RIGHT ARM  Final   Special Requests   Final    BOTTLES DRAWN AEROBIC AND ANAEROBIC Blood Culture adequate volume   Culture   Final    NO GROWTH 3 DAYS Performed at [facility], [address], [place], [place] [address]    Report Status PENDING  Incomplete  Blood culture (  routine x 2)     Status: None (Preliminary result)   Collection Time: 04/15/24  8:55 PM   Specimen: BLOOD  Result Value Ref Range Status   Specimen Description BLOOD BLOOD LEFT ARM  Final   Special Requests   Final    BOTTLES DRAWN AEROBIC AND ANAEROBIC Blood Culture results may not be optimal due to an inadequate volume of blood received in culture bottles   Culture   Final    NO GROWTH 3 DAYS Performed at [facility], [address], [place], [place] [address]    Report  Status PENDING  Incomplete  MRSA Next Gen by PCR, Nasal     Status: None   Collection Time: 04/15/24 10:01 PM   Specimen: Nasal Mucosa; Nasal Swab  Result Value Ref Range Status   MRSA by PCR Next Gen NOT DETECTED NOT DETECTED Final    Comment: (NOTE) The GeneXpert MRSA Assay (FDA approved for NASAL specimens only), is one component of a comprehensive MRSA colonization surveillance program. It is not intended to diagnose MRSA infection nor to guide or monitor treatment for MRSA infections. Test performance is not FDA approved in patients less than 71 years old. Performed at [facility], [address] [place], [place] [address]   Culture, Respiratory w Gram Stain     Status: None   Collection Time: 04/16/24 11:42 AM   Specimen: SPU; Respiratory  Result Value Ref Range Status   Specimen Description   Final    SPUTUM Performed at [facility], [address], [place], [place] [address]    Special Requests   Final     EXPSU Performed at [facility], [address] [place], [place] [address]    Gram Stain   Final    ABUNDANT SQUAMOUS EPITHELIAL CELLS PRESENT MODERATE GRAM POSITIVE COCCI MODERATE GRAM NEGATIVE RODS    Culture   Final    Normal respiratory flora-no Staph aureus or Pseudomonas seen Performed at [facility], [address] [place], [place] [address]    Report Status 04/18/2024 FINAL  Final  Respiratory (~20 pathogens) panel by PCR     Status: None   Collection Time: 04/16/24 11:42 AM   Specimen: Expectorated Sputum; Respiratory  Result Value Ref Range Status   Adenovirus NOT DETECTED NOT DETECTED Final   Coronavirus 229E NOT DETECTED NOT DETECTED Final    Comment: (NOTE) The Coronavirus on the Respiratory Panel, DOES NOT test for the novel  Coronavirus (2019 nCoV)    Coronavirus HKU1 NOT DETECTED NOT DETECTED Final   Coronavirus NL63 NOT DETECTED NOT DETECTED Final   Coronavirus OC43 NOT DETECTED NOT DETECTED Final    Metapneumovirus NOT DETECTED NOT DETECTED Final   Rhinovirus / Enterovirus NOT DETECTED NOT DETECTED Final   Influenza A NOT DETECTED NOT DETECTED Final   Influenza B NOT DETECTED NOT DETECTED Final   Parainfluenza Virus 1 NOT DETECTED NOT DETECTED Final   Parainfluenza Virus 2 NOT DETECTED NOT DETECTED Final   Parainfluenza Virus 3 NOT DETECTED NOT DETECTED Final   Parainfluenza Virus 4 NOT DETECTED NOT DETECTED Final   Respiratory Syncytial Virus NOT DETECTED NOT DETECTED Final   Bordetella pertussis NOT DETECTED NOT DETECTED Final   Bordetella Parapertussis NOT DETECTED NOT DETECTED Final   Chlamydophila pneumoniae NOT DETECTED NOT DETECTED Final   Mycoplasma pneumoniae NOT DETECTED NOT DETECTED Final    Comment: Performed at [facility], [address] [place], [place] [address]  Coagulation Studies: Recent Labs    04/15/24 1806 04/18/24 0324  LABPROT 16.9* 16.2*  INR 1.3* 1.2    Urinalysis: No results for input(s): COLORURINE, LABSPEC, PHURINE, GLUCOSEU, HGBUR, BILIRUBINUR, KETONESUR, PROTEINUR, UROBILINOGEN, NITRITE, LEUKOCYTESUR in the last 72 hours.  Invalid input(s): APPERANCEUR    Imaging: DG Chest Port 1 View Result Date: 04/18/2024 CLINICAL DATA:  Respiratory failure. EXAM: PORTABLE CHEST 1 VIEW COMPARISON:  04/17/2024 FINDINGS: Stable asymmetric elevation right hemidiaphragm with right base atelectasis/infiltrate and probable small right pleural effusion. There is pulmonary vascular congestion without overt pulmonary edema. Cardiopericardial silhouette is at upper limits of normal for size. No acute bony abnormality. Telemetry leads overlie the chest. IMPRESSION: 1. Stable asymmetric elevation right hemidiaphragm with right base atelectasis/infiltrate and probable small right pleural effusion. 2. Pulmonary vascular congestion without overt pulmonary edema. Electronically Signed   By: Camellia Candle M.D.   On: 04/18/2024 08:58   CT  ABDOMEN PELVIS WO CONTRAST Result Date: 04/17/2024 CLINICAL DATA:  Ileus or obstruction. EXAM: CT ABDOMEN AND PELVIS WITHOUT CONTRAST TECHNIQUE: Multidetector CT imaging of the abdomen and pelvis was performed following the standard protocol without IV contrast. RADIATION DOSE REDUCTION: This exam was performed according to the departmental dose-optimization program which includes automated exposure control, adjustment of the mA and/or kV according to patient size and/or use of iterative reconstruction technique. COMPARISON:  CT dated 04/15/2024. FINDINGS: Evaluation of this exam is limited in the absence of intravenous contrast. Lower chest: There is consolidation of the majority of the visualized right middle and right lower lobes. Debris noted in the right mainstem bronchus and right middle and right lower lobes. There is left lung base subpleural atelectasis. There is coronary vascular calcification. No intra-abdominal free air.  Small subhepatic fluid. Hepatobiliary: The liver is unremarkable. Vicarious excretion of contrast from prior study noted in the gallbladder. There is a stone in the cystic duct. No pericholecystic fluid or evidence of acute cholecystitis by CT. Pancreas: The pancreas is atrophic.  No active inflammatory changes. Spleen: Small scattered calcified splenic granuloma. Adrenals/Urinary Tract: The adrenal glands are unremarkable. Moderately atrophic kidneys. Small renal cysts and additional subcentimeter hypodense lesions which are too small to characterize. A 2 cm high attenuating lesion from the superior pole of the right kidney is not characterized but may represent a complex/proteinaceous cyst. This is similar to prior CT. Further evaluation with ultrasound on a nonemergent/outpatient basis recommended. There is no hydronephrosis on either side. Residual contrast noted in the renal collecting systems bilaterally as well as in the urinary bladder. The visualized ureters and urinary  bladder appear unremarkable. Stomach/Bowel: There is diffuse colonic diverticulosis. No active inflammation. There is diffuse dilatation of small-bowel loops measuring up to 3.8 cm. There is no discrete transition. A gradual transition noted in the right lower quadrant. Findings favor to represent an ileus and less likely a partial small bowel obstruction. Small-bowel series may provide better evaluation. The appendix is normal. Vascular/Lymphatic: An aorto bi iliac endovascular stent graft repair noted. The aorta is tortuous. There is a 3.7 cm aneurysmal dilatation of the infrarenal abdominal aorta. Bilateral common iliac aneurysms measure up to 4 cm on the left. The IVC is unremarkable. No portal venous gas. There is no adenopathy. Reproductive: The prostate is grossly unremarkable. Other: None Musculoskeletal: Osteopenia with degenerative changes of the spine and hips. No acute osseous pathology. IMPRESSION: 1. Diffuse dilatation of small-bowel loops without discrete transition. Findings favored to represent an ileus and less likely a partial small bowel obstruction. Small-bowel  series may provide better evaluation. 2. Colonic diverticulosis. Normal appendix. 3. Cholelithiasis. 4. Aneurysmal dilatation of the abdominal aorta measure up to 3.7 cm. An aorto bi iliac endovascular stent graft repair is seen. Recommend follow-up ultrasound every 3 years. (Ref.: J Vasc Surg. 2018; 67:2-77 and J Am Coll Radiol 2013;10(10):789-794.) 5. Debris in the bronchus intermedius with probable postobstructive atelectasis or pneumonia in the right middle and right lower lobes. Follow-up to resolution recommended. Electronically Signed   By: Vanetta Chou M.D.   On: 04/17/2024 18:37   DG Abd 1 View Result Date: 04/17/2024 CLINICAL DATA:  Abdominal pain. EXAM: ABDOMEN - 1 VIEW COMPARISON:  September 16, 2013. FINDINGS: Mildly dilated small bowel loops are noted without colonic dilatation. Mild amount of stool seen throughout the  colon. Status post stent graft repair of abdominal aorta. IMPRESSION: Mild small bowel dilatation is noted concerning for distal small bowel obstruction or possibly ileus. Electronically Signed   By: Lynwood Landy Raddle M.D.   On: 04/17/2024 14:52   DG Chest Port 1 View Result Date: 04/17/2024 CLINICAL DATA:  Respiratory failure with hypoxia. EXAM: PORTABLE CHEST 1 VIEW COMPARISON:  April 15, 2024. FINDINGS: Stable cardiomediastinal silhouette. Mild central pulmonary vascular congestion is noted. Elevated right hemidiaphragm is noted with minimal right basilar subsegmental atelectasis. Bony thorax is unremarkable. IMPRESSION: Mild central pulmonary vascular congestion is noted. Elevated right hemidiaphragm with minimal right basilar subsegmental atelectasis. Electronically Signed   By: Lynwood Landy Raddle M.D.   On: 04/17/2024 14:50     Medications:    acetaminophen  400 mL/hr at 04/18/24 1206   [START ON 04/22/2024]  ceFAZolin  (ANCEF ) IV     cefTRIAXone  (ROCEPHIN )  IV Stopped (04/17/24 2027)   clevidipine  5 mg/hr (04/18/24 1206)   esmolol  25 mcg/kg/min (04/18/24 1206)    amLODipine   10 mg Oral Daily   Chlorhexidine  Gluconate Cloth  6 each Topical Daily   hydrALAZINE   50 mg Oral Q8H   diphenhydrAMINE , docusate sodium , famotidine , hydrALAZINE , methylPREDNISolone  (SOLU-MEDROL ) injection, midazolam , ondansetron  (ZOFRAN ) IV, mouth rinse, polyethylene glycol  Assessment/ Plan:  78 y.o. male with past medical history of peripheral arterial disease, hypertension, abdominal aortic aneurysm, hyperlipidemia, bladder cancer, thrombocytopenia who was admitted with hypertensive crisis, abdominal aneurysm, type I endoleak of aortic graft.  1.  Acute kidney injury/chronic kidney disease stage IIIb baseline creatinine 2.2 with EGFR 30 on 01/16/2024.  Acute kidney injury likely multifactorial with contribution from hypoperfusion from bilateral renal artery stenosis, and exposure to contrast with CT angio  chest/pelvis/abdomen.  Creatinine currently 3.2 with an EGFR of 19.  Difficult situation noted.  We discussed the fact that he will likely need further contrast exposure to correct underlying renal artery stenosis.  However we did make patient aware that there is some risk that he could have deteriorating renal function to the point that he could temporarily require renal replacement therapy.  Patient understands this risk and willing to proceed at the moment.  We tried to optimize the patient's volume status but he did not tolerate IV fluids very well at all.  2.  Hypertension.  Patient currently on clevidipine  and esmolol  drip.  He is also maintained on amlodipine  and hydralazine .     LOS: 3 Nathan Rivera 7/24/20251:53 PM

## 2024-04-18 NOTE — Consult Note (Signed)
 PHARMACY CONSULT NOTE - ELECTROLYTES  Pharmacy Consult for Electrolyte Monitoring and Replacement   Recent Labs: Height: 5' 9 (175.3 cm) Weight: 98.8 kg (217 lb 13 oz) IBW/kg (Calculated) : 70.7 Estimated Creatinine Clearance: 22 mL/min (A) (by C-G formula based on SCr of 3.2 mg/dL (H)). Potassium (mmol/L)  Date Value  04/18/2024 4.1  10/23/2014 4.1   Magnesium (mg/dL)  Date Value  92/75/7974 2.1   Calcium (mg/dL)  Date Value  92/75/7974 8.4 (L)   Calcium, Total (mg/dL)  Date Value  98/71/7983 9.0   Albumin  (g/dL)  Date Value  98/74/7975 4.1  08/18/2013 1.4 (L)   Phosphorus (mg/dL)  Date Value  92/75/7974 5.1 (H)   Sodium (mmol/L)  Date Value  04/18/2024 139  10/23/2014 144    Assessment  Nathan Rivera is a 78 y.o. male presenting with abdominal pain and hemoptysis. PMH significant for Infrarenal abdominal aortic aneurysm s/p endovascular repair of a ruptured AAA in 2011 on Xarelto , PAD, HTN, duodenal ulcer with perforation s/p omental patch repair, CKD stage III, thrombocytopenia, HDL, prior tobacco use, ESBL bacteremia, bladder cancer, chronic back pain, and bilateral inguinal hernia s/p repair . Pharmacy has been consulted to monitor and replace electrolytes.  Diet: NPO MIVF: N/A Pertinent medications: clevidipine  gtt, esmolol  gtt  Goal of Therapy: Electrolytes WNL  Plan:  No replacement currently indicated Check BMP, Mg, Phos with AM labs  Thank you for allowing pharmacy to be a part of this patient's care.  Marolyn KATHEE Mare 04/18/2024 7:57 AM

## 2024-04-18 NOTE — Plan of Care (Addendum)
 The patient has been Nathan Rivera for the post part today. Vascular provider, nephrologist and Intensivist have seen the patient at the bedside today. Family has been at the bedside interacting with the patient through out the day. The patient was up in the chair for about 4 hrs today. The patient had one episode of unmeasured emesis which was green color. IV zofran  was given. Holding off on NG tube placement at this time per intensivist as the patient kept refusing ealier. The patient was given a bath. Cleviprex  drip was turned off at 1406 today. Per intensivist BP goal is to try and maintain the patient SBP between the 130's to 140's. Hydralazine  order has been adjusted per MD. No bowel movement today. The patient was given PRN colace and PRN miralax . The patient's oxygen level has been maintaining above 92% on 7 liters of oxygen via HFNC. Schedule IV tylenol  has been order for pain management. BUN and Creatine are being monitored and possible stent placement depending on lab work tomorrow morning.  Problem: Education: Goal: Knowledge of General Education information will improve Description: Including pain rating scale, medication(s)/side effects and non-pharmacologic comfort measures Outcome: Progressing   Problem: Health Behavior/Discharge Planning: Goal: Ability to manage health-related needs will improve Outcome: Progressing   Problem: Clinical Measurements: Goal: Ability to maintain clinical measurements within normal limits will improve Outcome: Progressing Goal: Will remain free from infection Outcome: Progressing Goal: Diagnostic test results will improve Outcome: Progressing Goal: Respiratory complications will improve Outcome: Progressing Goal: Cardiovascular complication will be avoided Outcome: Progressing   Problem: Activity: Goal: Risk for activity intolerance will decrease Outcome: Progressing   Problem: Nutrition: Goal: Adequate nutrition will be maintained Outcome:  Progressing   Problem: Coping: Goal: Level of anxiety will decrease Outcome: Progressing   Problem: Elimination: Goal: Will not experience complications related to bowel motility Outcome: Progressing Goal: Will not experience complications related to urinary retention Outcome: Progressing   Problem: Pain Managment: Goal: General experience of comfort will improve and/or be controlled Outcome: Progressing   Problem: Safety: Goal: Ability to remain free from injury will improve Outcome: Progressing   Problem: Skin Integrity: Goal: Risk for impaired skin integrity will decrease Outcome: Progressing

## 2024-04-18 NOTE — Progress Notes (Addendum)
 The patient refused insertion of NG tube. This RN and charge nurse attempted to place an NG tube without success as the patient would not stop moving pulling on the NG tube while trying to guide it in and moving his head away during the process of inserting the NG tube. Wife was at the bedside to try to calm the patient during the insertion attempts yet intervention continued to be unsuccessful. Intensivist has been notified about attempt and had reported to hold off on placing NG tube at this time.

## 2024-04-18 NOTE — Progress Notes (Signed)
 NAME:  Nathan Rivera, MRN:  [id number], DOB:  October 06, 1945, LOS: 3 ADMISSION DATE:  04/15/2024, CHIEF COMPLAINT:  Hemoptysis   History of Present Illness:   78 y.o male with significant PMH of Infrarenal abdominal aortic aneurysm s/p endovascular repair of a ruptured AAA in 2011 on Xarelto , PAD, HTN, duodenal ulcer with perforation s/p omental patch repair, CKD stage III, thrombocytopenia, HDL, prior tobacco use, ESBL bacteremia, bladder cancer, chronic back pain, and bilateral inguinal hernia s/p repair who presented to the ED with chief complaints of abdominal pain and hemoptysis.   Patient and family at bedside reports that he took his last dose of Xarelto  yesterday evening.  He subsequently developed some abdominal pain around the epigastric and left upper quadrant region.  Since onset of symptoms has been coughing up bright red blood which he describes  blood filling up his mouth.  Denies other associated symptoms   ED Course: Initial vital signs showed HR of 68 beats/minute, BP 192/102 mm Hg, the RR 18 breaths/minute, and the oxygen saturation 93% on RA and a temperature of 98.14F (36.7C). Pertinent Labs/Diagnostics Findings: Na+/ K+:140/4.2 Glucose:94  BUN/Cr.:39/2.43 TAR:Lwmzfjmxjaoz  PCT: negative <0.10  BNP: 394.5  CTA Chest> CT Abd/pelvis> Saccular aneurysm at the level of the proximal stent with some soft tissue thickening and stranding concerning possible endoleak or impending rupture,Large aneurysm involving the left common iliac artery, partially encompasses the left iliac stent, Aneurysmal dilatation of the right common iliac artery at the distal end of the stent  Multiple small aneurysms of the proximal internal and iliac vessels.    CT findings discussed with on-call vascular Dr. Marea who recommended hemodynamic monitoring as it is unlikely patient will require emergent intervention but may require repair during the course of admission. PCCM consulted for admission, BP management  and close monitoring.  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 Aorto bifemoral endograft placement for AAA,  Chronic right-sided low back pain without sciatica;  Thrombocytopenia  Raynaud's disease with gangrene  B12 deficiency;  Personal history of malignant neoplasm of bladder;  Tobacco use disorder;  Hematuria, gross  ESBL Bacteremia due to septic shock due to obstructive stone with Pyelonephritis bilateral Inguinal Hernia s/p repair (02/24)  Significant Hospital Events: Including procedures, antibiotic start and stop dates in addition to other pertinent events   7/21: admit to ICU, on clevidipine  and esmolol  7/22: no recurrence of hemoptysis, but he continues to have a cough. EGD normal without any PUD or sign of GI bleed 7/23: worsening renal function, holding on surgical intervention 7/24: kidney function stable, ileus, N/V this AM  Interim History / Subjective:  Uncomfortable this AM, with nausea and episode of vomiting. Sat in bed.  Objective    Blood pressure (!) 144/86, pulse 69, temperature 98.1 F (36.7 C), temperature source Oral, resp. rate 20, height 5' 9 (1.753 m), weight 98.8 kg, SpO2 97%.        Intake/Output Summary (Last 24 hours) at 04/18/2024 1601 Last data filed at 04/18/2024 1501 Gross per 24 hour  Intake 1629.38 ml  Output 500 ml  Net 1129.38 ml   Filed Weights   04/16/24 0500 04/17/24 0500 04/18/24 0600  Weight: 94.1 kg 97.3 kg 98.8 kg    Examination: Physical Exam Constitutional:      Appearance: He is not ill-appearing.  Cardiovascular:  Rate and Rhythm: Normal rate and regular rhythm.     Pulses: Normal pulses.     Heart sounds: Normal heart sounds.  Pulmonary:     Effort: Pulmonary effort is normal.     Breath sounds:  Normal breath sounds. No rales.  Neurological:     General: No focal deficit present.     Mental Status: He is alert and oriented to person, place, and time. Mental status is at baseline.     Assessment and Plan   #Hemoptysis #Endoleak with Aneurysm Expansion #History of Infrarenal AAA s/p endovascular repeair #HTN emergency #AKI on CKD #Community Acquired Pneumonia #Mild Cognitive Impairment  78 year old male presenting with an episode of hemoptysis (golf ball sized) with concern for hematemesis given history of GI bleed in the past. Patient was found to have a possible endoleak with threat of rupture and is admitted to the ICU for tight blood pressure control.  Neuro - history of mild cognitive impairment, holding memantine and donepezil  given bradycardia with esmolol . Showing signs of delirium which we are managing conservatively.  CV - history of hypertension, and presented with elevated blood pressure. Given concern for endovascular leak and risk of AAA rupture, he was under strict BP and heart rate control with esmolol  (goal HR < 60) and clevidipine  (SBP 100-120). Consulted with vascular surgery for evaluation for endovascular repair which was initially planned for today. He does have significant vascular disease (bilateral severe renal artery stenosis, severe celiac artery stenosis, infrarenal abdominal aortoiliac stent graft, aneurysmal dilation of the abdominal aorta at the level of the renal arteries with a saccular component concerning for leakage vs impending rupture, left common iliac artery aneurysm). Given renal disease and need for contrast, this will be delayed pending renal recovery, with consideration of renal artery intervention. We have to balance need to lower blood pressure (to decrease risk of leak/rupture) with normotension to allow for renal perfusion given severe renal artery stenosis. His renal artery stenosis puts him at risk for flash pulmonary edema. Will  liberalize blood pressure goals.  -goal SBP of 130  -goal HR < 60 Pulm - I have personally reviewed his chest CT and note multiple tree in bud nodules as well as minimal ground glass opacities that were scattered, concerning for viral infection versus bacterial pneumonia/bronchitis. He is on nasal cannula, and given fluid intake and renal dysfunction is at risk for pulmonary edema. Will repeat chest xray today. Antibiotics and infectious workup per ID section. There has been no recurrence of hemoptysis. GI - Underwent EGD that did not show any PUD or sign of GI bleed. Discontinued high dose PPI. CT abdomen with signs of ileus. Tried NG tube but patient unable to tolerate. Will maintain NPO and minimize narcotic use. Renal - AKI on CKD, with bilateral renal artery stenosis. This was likely exacerbated by contrast exposure. Will hold IV fluids for now given increase in oxygen requirements. We have to balance his blood pressure requirements (need to lower to decrease risk of aneurysmal rupture vs need for BP to perfuse the kidneys). Will liberalize his BP goal to 130. Kidney function is relatively stable today. Appreciate input from nephrology today. Endo - ICU glycemic protocol Hem/Onc - on home rivaroxaban , holding given hemoptysis. H/H stable ID - Viral panel negative, on Ceftriaxone  and Azithromycin  for CAP coverage. Sputum sample sent and cultures pending.   Best Practice (right click and Reselect all SmartList Selections daily)   Diet/type: Regular consistency (see orders) DVT prophylaxis not indicated  Pressure ulcer(s): N/A GI prophylaxis: N/A Lines: N/A Foley:  N/A Code Status:  full code Last date of multidisciplinary goals of care discussion [04/18/2024]  Labs   CBC: Recent Labs  Lab 04/15/24 1447 04/16/24 0339 04/17/24 0836 04/18/24 0324  WBC 9.1 10.2 11.2* 11.0*  NEUTROABS  --   --  8.9* 8.7*  HGB 15.3 14.5 16.6 14.3  HCT 47.3 44.0 RESULTS UNAVAILABLE DUE TO INTERFERING  SUBSTANCE 42.5  MCV 91.7 91.3 RESULTS UNAVAILABLE DUE TO INTERFERING SUBSTANCE 90.6  PLT 160 165 156 144*    Basic Metabolic Panel: Recent Labs  Lab 04/15/24 1447 04/16/24 0339 04/17/24 0357 04/18/24 0324  NA 140 142 140 139  K 4.2 4.2 4.4 4.1  CL 105 108 107 107  CO2 24 24 20* 21*  GLUCOSE 94 96 102* 88  BUN 39* 43* 50* 50*  CREATININE 2.43* 2.44* 3.08* 3.20*  CALCIUM 9.4 8.8* 8.7* 8.4*  MG  --  2.3 2.3 2.1  PHOS  --  3.9 4.3 5.1*   GFR: Estimated Creatinine Clearance: 22 mL/min (A) (by C-G formula based on SCr of 3.2 mg/dL (H)). Recent Labs  Lab 04/15/24 1447 04/15/24 2030 04/15/24 2218 04/16/24 0339 04/17/24 0836 04/18/24 0324  PROCALCITON  --  <0.10  --  <0.10  --   --   WBC 9.1  --   --  10.2 11.2* 11.0*  LATICACIDVEN  --  0.9 1.2  --   --   --     Liver Function Tests: No results for input(s): AST, ALT, ALKPHOS, BILITOT, PROT, ALBUMIN  in the last 168 hours. No results for input(s): LIPASE, AMYLASE in the last 168 hours. No results for input(s): AMMONIA in the last 168 hours.  ABG    Component Value Date/Time   PHART 7.273 (L) 08/24/2013 2337   PCO2ART 28.9 (L) 08/24/2013 2337   PO2ART 84.0 08/24/2013 2337   HCO3 13.4 (L) 08/24/2013 2337   TCO2 14 08/24/2013 2337   ACIDBASEDEF 12.0 (H) 08/24/2013 2337   O2SAT 95.0 08/24/2013 2337     Coagulation Profile: Recent Labs  Lab 04/15/24 1806 04/18/24 0324  INR 1.3* 1.2    Cardiac Enzymes: No results for input(s): CKTOTAL, CKMB, CKMBINDEX, TROPONINI in the last 168 hours.  HbA1C: No results found for: HGBA1C  CBG: Recent Labs  Lab 04/15/24 2153  GLUCAP 100*    Review of Systems:   N/A  Past Medical History:  He,  has a past medical history of Alzheimer dementia (HCC), Aortic aneurysm (HCC), Arthritis, Cancer (HCC) (2012), CKD (chronic kidney disease), stage III (HCC) (2014), Degenerative disc disease, Degenerative joint disease, GERD (gastroesophageal reflux  disease), History of kidney stones, Hypercholesterolemia, Hypertension, Impaired glucose tolerance, Muscle fatigue, Peripheral vascular disease (HCC), Pneumonia, Raynaud's disease, Sepsis (HCC) (07/2013), Stroke [facility]), Thromboembolism of left popilteal vein (2015), and UTI (urinary tract infection) (08/17/2013).   Surgical History:   Past Surgical History:  Procedure Laterality Date   ABDOMINAL AORTIC ANEURYSM REPAIR  2011   CATARACT EXTRACTION W/PHACO Left 02/15/2021   Procedure: CATARACT EXTRACTION PHACO AND INTRAOCULAR LENS PLACEMENT (IOC) LEFT 5.59 00:44.0;  Surgeon: Myrna Adine Anes, MD;  Location: [facility];  Service: Ophthalmology;  Laterality: Left;   CATARACT EXTRACTION W/PHACO Right 03/08/2021   Procedure: CATARACT EXTRACTION PHACO AND INTRAOCULAR LENS PLACEMENT (IOC) RIGHT 1.81 00:22.7;  Surgeon: Myrna Adine Anes, MD;  Location: [facility];  Service: Ophthalmology;  Laterality: Right;   ESOPHAGOGASTRODUODENOSCOPY N/A 04/16/2024   Procedure: EGD (ESOPHAGOGASTRODUODENOSCOPY);  Surgeon: Jinny,  Darren, MD;  Location: [facility];  Service: Endoscopy;  Laterality: N/A;   HERNIA REPAIR     72mo old   INSERTION OF MESH N/A 10/24/2014   Procedure: INSERTION OF MESH;  Surgeon: Deward Null III, MD;  Location: [facility] OR;  Service: General;  Laterality: N/A;   LAPAROTOMY N/A 08/24/2013   Procedure: EXPLORATORY LAPAROTOMY;  Surgeon: Deward GORMAN Null DOUGLAS, MD;  Location: [facility];  Service: General;  Laterality: N/A;  Gram patch closure   litrotripsy  09/24/2013   NEPHROSTOMY  Nov. 2014   nephrostomy removal  Dec. 2014   PERIPHERALLY INSERTED CENTRAL CATHETER INSERTION     TONSILLECTOMY     UMBILICAL HERNIA REPAIR     50 yr ago   VENTRAL HERNIA REPAIR N/A 10/24/2014   Procedure: LAPAROSCOPIC ASSISTED VENTRAL HERNIA REPAIR WITH MESH;  Surgeon: Deward Null III, MD;  Location: [facility] OR;  Service: General;  Laterality: N/A;   XI ROBOTIC ASSISTED INGUINAL HERNIA REPAIR WITH MESH Bilateral  11/17/2022   Procedure: XI ROBOTIC ASSISTED INGUINAL HERNIA REPAIR WITH MESH-Bilateral;  Surgeon: Desiderio Schanz, MD;  Location: [facility] ORS;  Service: General;  Laterality: Bilateral;     Social History:   reports that he has been smoking cigarettes. He has a 40 pack-year smoking history. He has been exposed to tobacco smoke. He has never used smokeless tobacco. He reports current alcohol use of about 3.0 standard drinks of alcohol per week. He reports that he does not use drugs.   Family History:  His family history includes Atrial fibrillation in his sister; CVA in his father.   Allergies Allergies  Allergen Reactions   Duloxetine Dermatitis     Home Medications  Prior to Admission medications   Medication Sig Start Date End Date Taking? Authorizing Provider  acetaminophen  (TYLENOL ) 325 MG tablet Take 325 mg by mouth every 6 (six) hours as needed for mild pain.   Yes [provider]  amLODipine  (NORVASC ) 5 MG tablet TAKE 1 TABLET BY MOUTH  DAILY 12/24/19  Yes Brown, Fallon E, NP  cholecalciferol (VITAMIN D) 1000 UNITS tablet Take 1,000 Units by mouth daily.   Yes [provider]  Cyanocobalamin (VITAMIN B-12 ER PO) Take by mouth.   Yes [provider]  donepezil  (ARICEPT ) 10 MG tablet Take 10 mg by mouth at bedtime.   Yes [provider]  FOLIC ACID PO Take by mouth.   Yes [provider]  gabapentin  (NEURONTIN ) 100 MG capsule 200 mg 2 (two) times daily. 200 mg in am and 200 mg in PM 04/01/17  Yes [provider]  Lactobacillus ([employer] PROBIOTIC ACIDOPHILUS) 10 MG CAPS Take by mouth.   Yes [provider]  memantine (NAMENDA) 10 MG tablet TAKE 1 TABLET BY MOUTH TWICE DAILY FOR MEMORY LOSS 02/28/24  Yes [provider]  pantoprazole  (PROTONIX ) 40 MG tablet Take 40 mg by mouth at bedtime.  11/13/13  Yes [provider]  simvastatin  (ZOCOR ) 20 MG tablet Take 20 mg by mouth at bedtime.   Yes [provider]   vitamin B-12 (CYANOCOBALAMIN) 500 MCG tablet Take 500 mcg by mouth daily.   Yes [provider]  XARELTO  20 MG TABS tablet daily.  12/23/13  Yes [provider]  amoxicillin -clavulanate (AUGMENTIN ) 875-125 MG tablet :1 Tablet(s) By Mouth Every 12 Hours Patient not taking: Reported on 04/15/2024 02/20/24   [provider]  pregabalin (LYRICA) 100 MG capsule Take 100 mg by mouth 2 (two) times daily. Patient not taking:  Reported on 04/15/2024 12/20/23   [provider]     The patient is critically ill due to renal failure, endoleak.  Critical care was necessary to treat or prevent imminent or life-threatening deterioration. Critical care time was spent by me on the following activities: development of a treatment plan with the patient and/or surrogate as well as nursing, discussions with consultants, evaluation of the patient's response to treatment, examination of the patient, obtaining a history from the patient or surrogate, ordering and performing treatments and interventions, ordering and review of laboratory studies, ordering and review of radiographic studies, review of telemetry data including pulse oximetry, re-evaluation of patient's condition and participation in multidisciplinary rounds.   I personally spent 32 minutes providing critical care not including any separately billable procedures.   Belva November, MD [facility] 04/18/2024 6:08 PM

## 2024-04-18 NOTE — Progress Notes (Signed)
 NPO sips with meds order has been placed. No further episodes of vomiting other then one episode this morning.

## 2024-04-18 NOTE — Progress Notes (Signed)
 [facility]  Daily Progress Note   Subjective  -   Patient still with significant oxygen requirement of about 7 L.  Also still complaining of vague abdominal pain.  No major events overnight.  Objective Vitals:   04/18/24 0630 04/18/24 0700 04/18/24 0715 04/18/24 0730  BP: (!) 146/77 138/72 (!) 156/83 (!) 148/79  Pulse:  62  63  Resp: 13 14 16 15   Temp:    97.8 F (36.6 C)  TempSrc:    Oral  SpO2:  95%  99%  Weight:      Height:        Intake/Output Summary (Last 24 hours) at 04/18/2024 0805 Last data filed at 04/18/2024 0754 Gross per 24 hour  Intake 2330.82 ml  Output 500 ml  Net 1830.82 ml    PULM  somewhat labored and requiring high flow oxygen. CV  RRR   Laboratory CBC    Component Value Date/Time   WBC 11.0 (H) 04/18/2024 0324   HGB 14.3 04/18/2024 0324   HGB 10.3 (L) 08/18/2013 0504   HCT 42.5 04/18/2024 0324   HCT 30.9 (L) 08/18/2013 0504   PLT 144 (L) 04/18/2024 0324   PLT 161 08/18/2013 0504    BMET    Component Value Date/Time   NA 139 04/18/2024 0324   NA 144 10/23/2014 1630   K 4.1 04/18/2024 0324   K 4.1 10/23/2014 1630   CL 107 04/18/2024 0324   CL 112 (H) 10/23/2014 1630   CO2 21 (L) 04/18/2024 0324   CO2 26 10/23/2014 1630   GLUCOSE 88 04/18/2024 0324   GLUCOSE 83 10/23/2014 1630   BUN 50 (H) 04/18/2024 0324   BUN 30 (H) 10/23/2014 1630   CREATININE 3.20 (H) 04/18/2024 0324   CREATININE 1.96 (H) 10/23/2014 1630   CALCIUM 8.4 (L) 04/18/2024 0324   CALCIUM 9.0 10/23/2014 1630   GFRNONAA 19 (L) 04/18/2024 0324   GFRNONAA 36 (L) 10/23/2014 1630   GFRNONAA 8 (L) 08/18/2013 0504   GFRAA 39 (L) 10/25/2014 0352   GFRAA 44 (L) 10/23/2014 1630   GFRAA 9 (L) 08/18/2013 0504    Assessment/Planning: Patient with multiple ongoing issues.    Biggest issue currently is likely acute kidney injury on top of severe chronic kidney disease.  Creatinine slightly up today to 3.2. Consideration for renal angiogram and  possible stent placement preliminary to any other vascular procedures in hopes of improving and stabilizing his renal function.  He has known bilateral renal artery stenosis.  Do not want to do this if his creatinine continues to increase, but it may be necessary to avoid dialysis.  Difficult situation. Type Ib endoleak with enlarging aneurysm.  No active bleeding from this, but certainly this is a concerning finding and would require treatment.  Would plan treatment once his renal function seems to be stabilized.potentially next week    Selinda Gu  04/18/2024, 8:05 AM

## 2024-04-19 ENCOUNTER — Encounter: Payer: Self-pay | Admitting: Student in an Organized Health Care Education/Training Program

## 2024-04-19 ENCOUNTER — Encounter
Admission: EM | Disposition: A | Discharge status: Home Health | Payer: Self-pay | Source: Home / Self Care | Attending: Student in an Organized Health Care Education/Training Program

## 2024-04-19 ENCOUNTER — Other Ambulatory Visit: Payer: Self-pay

## 2024-04-19 DIAGNOSIS — I714 Abdominal aortic aneurysm, without rupture, unspecified: Secondary | ICD-10-CM | POA: Diagnosis not present

## 2024-04-19 DIAGNOSIS — I161 Hypertensive emergency: Secondary | ICD-10-CM

## 2024-04-19 DIAGNOSIS — T82330A Leakage of aortic (bifurcation) graft (replacement), initial encounter: Secondary | ICD-10-CM | POA: Diagnosis not present

## 2024-04-19 DIAGNOSIS — I150 Renovascular hypertension: Secondary | ICD-10-CM

## 2024-04-19 DIAGNOSIS — R042 Hemoptysis: Secondary | ICD-10-CM | POA: Diagnosis not present

## 2024-04-19 DIAGNOSIS — N179 Acute kidney failure, unspecified: Secondary | ICD-10-CM | POA: Diagnosis not present

## 2024-04-19 DIAGNOSIS — I701 Atherosclerosis of renal artery: Secondary | ICD-10-CM | POA: Diagnosis not present

## 2024-04-19 DIAGNOSIS — T82310A Breakdown (mechanical) of aortic (bifurcation) graft (replacement), initial encounter: Secondary | ICD-10-CM | POA: Diagnosis not present

## 2024-04-19 HISTORY — PX: RENAL ANGIOGRAPHY: CATH118260

## 2024-04-19 LAB — BPAM RBC
Blood Product Expiration Date: 202508062359
Blood Product Expiration Date: 202508062359
Blood Product Expiration Date: 202508262359
Blood Product Expiration Date: 202508262359
Unit Type and Rh: 600
Unit Type and Rh: 600
Unit Type and Rh: 6200
Unit Type and Rh: 6200

## 2024-04-19 LAB — CBC WITH DIFFERENTIAL/PLATELET
Abs Immature Granulocytes: 0.06 K/uL (ref 0.00–0.07)
Basophils Absolute: 0 K/uL (ref 0.0–0.1)
Basophils Relative: 0 %
Eosinophils Absolute: 0.1 K/uL (ref 0.0–0.5)
Eosinophils Relative: 1 %
HCT: 38.9 % — ABNORMAL LOW (ref 39.0–52.0)
Hemoglobin: 12.7 g/dL — ABNORMAL LOW (ref 13.0–17.0)
Immature Granulocytes: 1 %
Lymphocytes Relative: 10 %
Lymphs Abs: 0.9 K/uL (ref 0.7–4.0)
MCH: 30.3 pg (ref 26.0–34.0)
MCHC: 32.6 g/dL (ref 30.0–36.0)
MCV: 92.8 fL (ref 80.0–100.0)
Monocytes Absolute: 0.7 K/uL (ref 0.1–1.0)
Monocytes Relative: 7 %
Neutro Abs: 7.3 K/uL (ref 1.7–7.7)
Neutrophils Relative %: 81 %
Platelets: 117 K/uL — ABNORMAL LOW (ref 150–400)
RBC: 4.19 MIL/uL — ABNORMAL LOW (ref 4.22–5.81)
RDW: 15.5 % (ref 11.5–15.5)
WBC: 9.1 K/uL (ref 4.0–10.5)
nRBC: 0 % (ref 0.0–0.2)

## 2024-04-19 LAB — TYPE AND SCREEN
ABO/RH(D): A POS
Antibody Screen: NEGATIVE
Unit division: 0
Unit division: 0
Unit division: 0
Unit division: 0

## 2024-04-19 LAB — BASIC METABOLIC PANEL WITH GFR
Anion gap: 9 (ref 5–15)
BUN: 54 mg/dL — ABNORMAL HIGH (ref 8–23)
CO2: 19 mmol/L — ABNORMAL LOW (ref 22–32)
Calcium: 7.9 mg/dL — ABNORMAL LOW (ref 8.9–10.3)
Chloride: 111 mmol/L (ref 98–111)
Creatinine, Ser: 2.86 mg/dL — ABNORMAL HIGH (ref 0.61–1.24)
GFR, Estimated: 22 mL/min — ABNORMAL LOW (ref >60)
Glucose, Bld: 76 mg/dL (ref 70–99)
Potassium: 4.5 mmol/L (ref 3.5–5.1)
Sodium: 139 mmol/L (ref 135–145)

## 2024-04-19 LAB — TROPONIN I (HIGH SENSITIVITY)
Troponin I (High Sensitivity): 16 ng/L (ref <18)
Troponin I (High Sensitivity): 17 ng/L (ref <18)

## 2024-04-19 LAB — MAGNESIUM: Magnesium: 2.3 mg/dL (ref 1.7–2.4)

## 2024-04-19 LAB — PHOSPHORUS: Phosphorus: 5.4 mg/dL — ABNORMAL HIGH (ref 2.5–4.6)

## 2024-04-19 MED ORDER — ATROPINE SULFATE 1 MG/10ML IJ SOSY
PREFILLED_SYRINGE | INTRAMUSCULAR | Status: DC | PRN
  Administered 2024-04-19: 1 mg via INTRAVENOUS

## 2024-04-19 MED ORDER — DIPHENHYDRAMINE HCL 50 MG/ML IJ SOLN: 50.0000 mg | Freq: Once | INTRAMUSCULAR | Status: DC | PRN

## 2024-04-19 MED ORDER — METOPROLOL TARTRATE 25 MG PO TABS
25.0000 mg | ORAL_TABLET | Freq: Two times a day (BID) | ORAL | Status: DC
  Administered 2024-04-19 – 2024-04-22 (×6): 25 mg via ORAL
  Filled 2024-04-19 (×6): qty 1

## 2024-04-19 MED ORDER — SODIUM CHLORIDE 0.9 % IV SOLN: INTRAVENOUS | Status: DC

## 2024-04-19 MED ORDER — FENTANYL CITRATE (PF) 100 MCG/2ML IJ SOLN
INTRAMUSCULAR | Status: AC
  Filled 2024-04-19: qty 2

## 2024-04-19 MED ORDER — HEPARIN (PORCINE) IN NACL 1000-0.9 UT/500ML-% IV SOLN
INTRAVENOUS | Status: DC | PRN
Start: 2024-04-19 — End: 2024-04-19
  Administered 2024-04-19: 500 mL

## 2024-04-19 MED ORDER — CEFAZOLIN SODIUM-DEXTROSE 2-4 GM/100ML-% IV SOLN
2.0000 g | INTRAVENOUS | Status: AC
  Administered 2024-04-19: 2 g via INTRAVENOUS

## 2024-04-19 MED ORDER — MIDAZOLAM HCL 2 MG/2ML IJ SOLN
INTRAMUSCULAR | Status: AC
  Filled 2024-04-19: qty 4

## 2024-04-19 MED ORDER — FAMOTIDINE 20 MG PO TABS: 40.0000 mg | ORAL_TABLET | Freq: Once | ORAL | Status: DC | PRN

## 2024-04-19 MED ORDER — ASPIRIN 81 MG PO TBEC
81.0000 mg | DELAYED_RELEASE_TABLET | Freq: Every day | ORAL | Status: DC
  Administered 2024-04-19 – 2024-04-30 (×11): 81 mg via ORAL
  Filled 2024-04-19 (×11): qty 1

## 2024-04-19 MED ORDER — CLOPIDOGREL BISULFATE 75 MG PO TABS
75.0000 mg | ORAL_TABLET | Freq: Every day | ORAL | Status: DC
  Administered 2024-04-19 – 2024-04-29 (×10): 75 mg via ORAL
  Filled 2024-04-19 (×10): qty 1

## 2024-04-19 MED ORDER — ACETAMINOPHEN 10 MG/ML IV SOLN
1000.0000 mg | Freq: Four times a day (QID) | INTRAVENOUS | Status: AC
  Administered 2024-04-19 – 2024-04-20 (×4): 1000 mg via INTRAVENOUS
  Filled 2024-04-19 (×4): qty 100

## 2024-04-19 MED ORDER — LIDOCAINE-EPINEPHRINE (PF) 1 %-1:200000 IJ SOLN
INTRAMUSCULAR | Status: DC | PRN
  Administered 2024-04-19: 10 mL

## 2024-04-19 MED ORDER — IODIXANOL 320 MG/ML IV SOLN
INTRAVENOUS | Status: DC | PRN
  Administered 2024-04-19: 70 mL

## 2024-04-19 MED ORDER — HEPARIN SODIUM (PORCINE) 1000 UNIT/ML IJ SOLN
INTRAMUSCULAR | Status: AC
  Filled 2024-04-19: qty 10

## 2024-04-19 MED ORDER — FENTANYL CITRATE (PF) 100 MCG/2ML IJ SOLN
INTRAMUSCULAR | Status: DC | PRN
  Administered 2024-04-19: 12.5 ug via INTRAVENOUS
  Administered 2024-04-19: 25 ug via INTRAVENOUS

## 2024-04-19 MED ORDER — HEPARIN SODIUM (PORCINE) 1000 UNIT/ML IJ SOLN
INTRAMUSCULAR | Status: DC | PRN
  Administered 2024-04-19: 4000 [IU] via INTRAVENOUS
  Administered 2024-04-19: 2000 [IU] via INTRAVENOUS

## 2024-04-19 MED ORDER — METHYLPREDNISOLONE SODIUM SUCC 125 MG IJ SOLR: 125.0000 mg | Freq: Once | INTRAMUSCULAR | Status: DC | PRN

## 2024-04-19 MED ORDER — MIDAZOLAM HCL 2 MG/2ML IJ SOLN
INTRAMUSCULAR | Status: DC | PRN
  Administered 2024-04-19 (×2): .5 mg via INTRAVENOUS

## 2024-04-19 MED ORDER — HYDROCODONE-ACETAMINOPHEN 5-325 MG PO TABS: 1.0000 | ORAL_TABLET | Freq: Four times a day (QID) | ORAL | Status: DC | PRN

## 2024-04-19 MED ORDER — FENTANYL CITRATE PF 50 MCG/ML IJ SOSY
25.0000 ug | PREFILLED_SYRINGE | INTRAMUSCULAR | Status: DC | PRN
  Administered 2024-04-19: 25 ug via INTRAVENOUS
  Filled 2024-04-19: qty 1

## 2024-04-19 NOTE — Progress Notes (Signed)
 [facility]  ROUNDING NOTE   Subjective:   Patient with past medical history of peripheral arterial disease, hypertension, abdominal aortic aneurysm, hyperlipidemia, bladder cancer, thrombocytopenia who was admitted with hypertensive crisis, abdominal aneurysm, type I endoleak of aortic graft.  Update: The patient's renal function has improved. Creatinine down to 2.8. Urine output recorded 850 cc. Patient due for renal arteriogram and possible intervention today.  07/24 0701 - 07/25 0700 In: 1367.8 [P.O.:65; I.V.:399; IV Piggyback:903.8] Out: 850 [Urine:850] Lab Results  Component Value Date   CREATININE 2.86 (H) 04/19/2024   CREATININE 3.20 (H) 04/18/2024   CREATININE 3.08 (H) 04/17/2024     Objective:  Vital signs in last 24 hours:  Temp:  [97.4 F (36.3 C)-98.4 F (36.9 C)] 97.4 F (36.3 C) (07/25 1606) Pulse Rate:  [0-105] 61 (07/25 1615) Resp:  [11-20] 11 (07/25 1615) BP: (103-173)/(50-107) 152/84 (07/25 1600) SpO2:  [88 %-100 %] 94 % (07/25 1615) Weight:  [99.9 kg] 99.9 kg (07/25 1232)  Weight change: 1.1 kg Filed Weights   04/18/24 0600 04/19/24 0445 04/19/24 1232  Weight: 98.8 kg 99.9 kg 99.9 kg    Intake/Output: I/O last 3 completed shifts: In: 2115.4 [P.O.:65; I.V.:796.6; IV Piggyback:1253.8] Out: 1000 [Urine:1000]   Intake/Output this shift:  Total I/O In: 506.1 [I.V.:406.1; IV Piggyback:100] Out: 350 [Urine:350]  Physical Exam: General: No acute distress  Head: Normocephalic, atraumatic. Moist oral mucosal membranes  Neck: Supple  Lungs:  Scattered rhonchi, normal effort  Heart: S1S2 no rubs  Abdomen:  Soft, nontender, bowel sounds present  Extremities: Trace peripheral edema.  Neurologic: Awake, alert, following commands  Skin: No acute rash  Access: No hemodialysis access    Basic Metabolic Panel: Recent Labs  Lab 04/15/24 1447 04/16/24 0339 04/17/24 0357 04/18/24 0324 04/19/24 0232  NA 140 142 140 139 139  K 4.2  4.2 4.4 4.1 4.5  CL 105 108 107 107 111  CO2 24 24 20* 21* 19*  GLUCOSE 94 96 102* 88 76  BUN 39* 43* 50* 50* 54*  CREATININE 2.43* 2.44* 3.08* 3.20* 2.86*  CALCIUM 9.4 8.8* 8.7* 8.4* 7.9*  MG  --  2.3 2.3 2.1 2.3  PHOS  --  3.9 4.3 5.1* 5.4*    Liver Function Tests: No results for input(s): AST, ALT, ALKPHOS, BILITOT, PROT, ALBUMIN  in the last 168 hours. No results for input(s): LIPASE, AMYLASE in the last 168 hours. No results for input(s): AMMONIA in the last 168 hours.  CBC: Recent Labs  Lab 04/15/24 1447 04/16/24 0339 04/17/24 0836 04/18/24 0324 04/19/24 0232  WBC 9.1 10.2 11.2* 11.0* 9.1  NEUTROABS  --   --  8.9* 8.7* 7.3  HGB 15.3 14.5 16.6 14.3 12.7*  HCT 47.3 44.0 RESULTS UNAVAILABLE DUE TO INTERFERING SUBSTANCE 42.5 38.9*  MCV 91.7 91.3 RESULTS UNAVAILABLE DUE TO INTERFERING SUBSTANCE 90.6 92.8  PLT 160 165 156 144* 117*    Cardiac Enzymes: No results for input(s): CKTOTAL, CKMB, CKMBINDEX, TROPONINI in the last 168 hours.  BNP: Invalid input(s): POCBNP  CBG: Recent Labs  Lab 04/15/24 2153  GLUCAP 100*    Microbiology: Results for orders placed or performed during the hospital encounter of 04/15/24  Blood culture (routine x 2)     Status: None (Preliminary result)   Collection Time: 04/15/24  8:55 PM   Specimen: BLOOD  Result Value Ref Range Status   Specimen Description BLOOD BLOOD RIGHT ARM  Final   Special Requests   Final    BOTTLES DRAWN AEROBIC  AND ANAEROBIC Blood Culture adequate volume   Culture   Final    NO GROWTH 4 DAYS Performed at [facility], [address] [place], [place] [address]    Report Status PENDING  Incomplete  Blood culture (routine x 2)     Status: None (Preliminary result)   Collection Time: 04/15/24  8:55 PM   Specimen: BLOOD  Result Value Ref Range Status   Specimen Description BLOOD BLOOD LEFT ARM  Final   Special Requests   Final    BOTTLES DRAWN AEROBIC AND ANAEROBIC  Blood Culture results may not be optimal due to an inadequate volume of blood received in culture bottles   Culture   Final    NO GROWTH 4 DAYS Performed at [facility], [address], [place], [place] [address]    Report Status PENDING  Incomplete  MRSA Next Gen by PCR, Nasal     Status: None   Collection Time: 04/15/24 10:01 PM   Specimen: Nasal Mucosa; Nasal Swab  Result Value Ref Range Status   MRSA by PCR Next Gen NOT DETECTED NOT DETECTED Final    Comment: (NOTE) The GeneXpert MRSA Assay (FDA approved for NASAL specimens only), is one component of a comprehensive MRSA colonization surveillance program. It is not intended to diagnose MRSA infection nor to guide or monitor treatment for MRSA infections. Test performance is not FDA approved in patients less than 41 years old. Performed at [facility], [address] [place], [place] [address]   Culture, Respiratory w Gram Stain     Status: None   Collection Time: 04/16/24 11:42 AM   Specimen: SPU; Respiratory  Result Value Ref Range Status   Specimen Description   Final    SPUTUM Performed at [facility], [address] [place], [place] [address]    Special Requests   Final     EXPSU Performed at [facility], [address] [place], [place] [address]    Gram Stain   Final    ABUNDANT SQUAMOUS EPITHELIAL CELLS PRESENT MODERATE GRAM POSITIVE COCCI MODERATE GRAM NEGATIVE RODS    Culture   Final    Normal respiratory flora-no Staph aureus or Pseudomonas seen Performed at [facility], [address], [place], [place] [address]    Report Status 04/18/2024 FINAL  Final  Respiratory (~20 pathogens) panel by PCR     Status: None   Collection Time: 04/16/24 11:42 AM   Specimen: Expectorated Sputum; Respiratory  Result Value Ref Range Status   Adenovirus NOT DETECTED NOT DETECTED Final   Coronavirus 229E NOT DETECTED NOT DETECTED Final    Comment: (NOTE) The Coronavirus  on the Respiratory Panel, DOES NOT test for the novel  Coronavirus (2019 nCoV)    Coronavirus HKU1 NOT DETECTED NOT DETECTED Final   Coronavirus NL63 NOT DETECTED NOT DETECTED Final   Coronavirus OC43 NOT DETECTED NOT DETECTED Final   Metapneumovirus NOT DETECTED NOT DETECTED Final   Rhinovirus / Enterovirus NOT DETECTED NOT DETECTED Final   Influenza A NOT DETECTED NOT DETECTED Final   Influenza B NOT DETECTED NOT DETECTED Final   Parainfluenza Virus 1 NOT DETECTED NOT DETECTED Final   Parainfluenza Virus 2 NOT DETECTED NOT DETECTED Final   Parainfluenza Virus 3 NOT DETECTED NOT DETECTED Final   Parainfluenza Virus 4 NOT DETECTED NOT DETECTED Final   Respiratory Syncytial Virus NOT DETECTED NOT DETECTED Final   Bordetella pertussis NOT DETECTED NOT DETECTED Final   Bordetella  Parapertussis NOT DETECTED NOT DETECTED Final   Chlamydophila pneumoniae NOT DETECTED NOT DETECTED Final   Mycoplasma pneumoniae NOT DETECTED NOT DETECTED Final    Comment: Performed at [facility], [address] [place], [place] [address]    Coagulation Studies: Recent Labs    04/18/24 0324  LABPROT 16.2*  INR 1.2    Urinalysis: No results for input(s): COLORURINE, LABSPEC, PHURINE, GLUCOSEU, HGBUR, BILIRUBINUR, KETONESUR, PROTEINUR, UROBILINOGEN, NITRITE, LEUKOCYTESUR in the last 72 hours.  Invalid input(s): APPERANCEUR    Imaging: PERIPHERAL VASCULAR CATHETERIZATION Result Date: 04/19/2024 See surgical note for result.  DG Chest Port 1 View Result Date: 04/18/2024 CLINICAL DATA:  Respiratory failure. EXAM: PORTABLE CHEST 1 VIEW COMPARISON:  04/17/2024 FINDINGS: Stable asymmetric elevation right hemidiaphragm with right base atelectasis/infiltrate and probable small right pleural effusion. There is pulmonary vascular congestion without overt pulmonary edema. Cardiopericardial silhouette is at upper limits of normal for size. No acute bony abnormality. Telemetry leads  overlie the chest. IMPRESSION: 1. Stable asymmetric elevation right hemidiaphragm with right base atelectasis/infiltrate and probable small right pleural effusion. 2. Pulmonary vascular congestion without overt pulmonary edema. Electronically Signed   By: Camellia Candle M.D.   On: 04/18/2024 08:58   CT ABDOMEN PELVIS WO CONTRAST Result Date: 04/17/2024 CLINICAL DATA:  Ileus or obstruction. EXAM: CT ABDOMEN AND PELVIS WITHOUT CONTRAST TECHNIQUE: Multidetector CT imaging of the abdomen and pelvis was performed following the standard protocol without IV contrast. RADIATION DOSE REDUCTION: This exam was performed according to the departmental dose-optimization program which includes automated exposure control, adjustment of the mA and/or kV according to patient size and/or use of iterative reconstruction technique. COMPARISON:  CT dated 04/15/2024. FINDINGS: Evaluation of this exam is limited in the absence of intravenous contrast. Lower chest: There is consolidation of the majority of the visualized right middle and right lower lobes. Debris noted in the right mainstem bronchus and right middle and right lower lobes. There is left lung base subpleural atelectasis. There is coronary vascular calcification. No intra-abdominal free air.  Small subhepatic fluid. Hepatobiliary: The liver is unremarkable. Vicarious excretion of contrast from prior study noted in the gallbladder. There is a stone in the cystic duct. No pericholecystic fluid or evidence of acute cholecystitis by CT. Pancreas: The pancreas is atrophic.  No active inflammatory changes. Spleen: Small scattered calcified splenic granuloma. Adrenals/Urinary Tract: The adrenal glands are unremarkable. Moderately atrophic kidneys. Small renal cysts and additional subcentimeter hypodense lesions which are too small to characterize. A 2 cm high attenuating lesion from the superior pole of the right kidney is not characterized but may represent a complex/proteinaceous  cyst. This is similar to prior CT. Further evaluation with ultrasound on a nonemergent/outpatient basis recommended. There is no hydronephrosis on either side. Residual contrast noted in the renal collecting systems bilaterally as well as in the urinary bladder. The visualized ureters and urinary bladder appear unremarkable. Stomach/Bowel: There is diffuse colonic diverticulosis. No active inflammation. There is diffuse dilatation of small-bowel loops measuring up to 3.8 cm. There is no discrete transition. A gradual transition noted in the right lower quadrant. Findings favor to represent an ileus and less likely a partial small bowel obstruction. Small-bowel series may provide better evaluation. The appendix is normal. Vascular/Lymphatic: An aorto bi iliac endovascular stent graft repair noted. The aorta is tortuous. There is a 3.7 cm aneurysmal dilatation of the infrarenal abdominal aorta. Bilateral common iliac aneurysms measure up to 4 cm on the left. The IVC is unremarkable. No portal venous  gas. There is no adenopathy. Reproductive: The prostate is grossly unremarkable. Other: None Musculoskeletal: Osteopenia with degenerative changes of the spine and hips. No acute osseous pathology. IMPRESSION: 1. Diffuse dilatation of small-bowel loops without discrete transition. Findings favored to represent an ileus and less likely a partial small bowel obstruction. Small-bowel series may provide better evaluation. 2. Colonic diverticulosis. Normal appendix. 3. Cholelithiasis. 4. Aneurysmal dilatation of the abdominal aorta measure up to 3.7 cm. An aorto bi iliac endovascular stent graft repair is seen. Recommend follow-up ultrasound every 3 years. (Ref.: J Vasc Surg. 2018; 67:2-77 and J Am Coll Radiol 2013;10(10):789-794.) 5. Debris in the bronchus intermedius with probable postobstructive atelectasis or pneumonia in the right middle and right lower lobes. Follow-up to resolution recommended. Electronically Signed    By: Vanetta Chou M.D.   On: 04/17/2024 18:37     Medications:    cefTRIAXone  (ROCEPHIN )  IV Stopped (04/18/24 2133)   clevidipine  Stopped (04/18/24 1406)   esmolol  100 mcg/kg/min (04/19/24 1618)    amLODipine   10 mg Oral Daily   aspirin EC  81 mg Oral Daily   Chlorhexidine  Gluconate Cloth  6 each Topical Daily   clopidogrel  75 mg Oral Daily   hydrALAZINE   50 mg Oral Q8H   docusate sodium , hydrALAZINE , ondansetron  (ZOFRAN ) IV, mouth rinse, polyethylene glycol  Assessment/ Plan:  78 y.o. male with past medical history of peripheral arterial disease, hypertension, abdominal aortic aneurysm, hyperlipidemia, bladder cancer, thrombocytopenia who was admitted with hypertensive crisis, abdominal aneurysm, type I endoleak of aortic graft.  1.  Acute kidney injury/chronic kidney disease stage IIIb baseline creatinine 2.2 with EGFR 30 on 01/16/2024.  Acute kidney injury likely multifactorial with contribution from hypoperfusion from bilateral renal artery stenosis, and exposure to contrast with CT angio chest/pelvis/abdomen.   07/24 0701 - 07/25 0700 In: 1367.8 [P.O.:65; I.V.:399; IV Piggyback:903.8] Out: 850 [Urine:850] Lab Results  Component Value Date   CREATININE 2.86 (H) 04/19/2024   CREATININE 3.20 (H) 04/18/2024   CREATININE 3.08 (H) 04/17/2024   Update: Creatinine trending down and currently 2.86.  Urine output 8 and 50 cc recorded over the preceding 24 hours.  Patient due for renal arteriogram and possible renal artery stent placement.  Patient aware of the risks associated with contrast exposure including the potential need for renal placement therapy.  Patient is willing to proceed at this time.  Volume optimization was previously attempted and patient became short of breath with IV fluid hydration.  2.  Hypertension.  Patient remains on esmolol  drip but weaned off of clevidipine .     LOS: 4 Eddye Broxterman 7/25/20254:46 PM

## 2024-04-19 NOTE — Plan of Care (Addendum)
 Small amount of drainage has developed from around dressing site covering surgical site. Vascular service has been notified. RN is currently monitoring site. Doppler checks completed and the patient has been stable. A regular diet has been placed per intensivist. Per Cath lab nurse the patient had a 3 second pause while in the recovery area. MD is aware and ICU intensivist have been notified when the patient returned to the room. Wife was notified after the procedure. No interventions at this time.  Problem: Education: Goal: Knowledge of General Education information will improve Description: Including pain rating scale, medication(s)/side effects and non-pharmacologic comfort measures Outcome: Progressing   Problem: Health Behavior/Discharge Planning: Goal: Ability to manage health-related needs will improve Outcome: Progressing   Problem: Clinical Measurements: Goal: Ability to maintain clinical measurements within normal limits will improve Outcome: Progressing Goal: Will remain free from infection Outcome: Progressing Goal: Diagnostic test results will improve Outcome: Progressing Goal: Respiratory complications will improve Outcome: Progressing Goal: Cardiovascular complication will be avoided Outcome: Progressing   Problem: Activity: Goal: Risk for activity intolerance will decrease Outcome: Progressing   Problem: Nutrition: Goal: Adequate nutrition will be maintained Outcome: Progressing   Problem: Coping: Goal: Level of anxiety will decrease Outcome: Progressing   Problem: Elimination: Goal: Will not experience complications related to bowel motility Outcome: Progressing Goal: Will not experience complications related to urinary retention Outcome: Progressing   Problem: Pain Managment: Goal: General experience of comfort will improve and/or be controlled Outcome: Progressing   Problem: Safety: Goal: Ability to remain free from injury will improve Outcome:  Progressing   Problem: Skin Integrity: Goal: Risk for impaired skin integrity will decrease Outcome: Progressing

## 2024-04-19 NOTE — Plan of Care (Signed)
 Patient alert to self only. Patient denies pain or shortness of breath. Currently on bubble high flow nasal cannula at 5 LPM. Patient has been coughing this shift and he states that is not new. Esmolol  titrated to keep SBP 130-140's. Patient had an 8.67 second arrest this shift, NP notified and a stat EKG and stat troponin ordered. NP notified of EKG completed and on the chart and troponin result was 16; no new orders received at that time. Patient has not had a BM this shift, continue to monitor.  VSS. Patient is high fall risk and bed alarm is on. Call light is within reach.  Problem: Education: Goal: Knowledge of General Education information will improve Description: Including pain rating scale, medication(s)/side effects and non-pharmacologic comfort measures Outcome: Progressing   Problem: Clinical Measurements: Goal: Ability to maintain clinical measurements within normal limits will improve Outcome: Progressing Goal: Will remain free from infection Outcome: Progressing Goal: Diagnostic test results will improve Outcome: Progressing Goal: Respiratory complications will improve Outcome: Progressing   Problem: Coping: Goal: Level of anxiety will decrease Outcome: Progressing   Problem: Pain Managment: Goal: General experience of comfort will improve and/or be controlled Outcome: Progressing   Problem: Safety: Goal: Ability to remain free from injury will improve Outcome: Progressing   Problem: Clinical Measurements: Goal: Cardiovascular complication will be avoided Outcome: Not Progressing   Problem: Activity: Goal: Risk for activity intolerance will decrease Outcome: Not Progressing   Problem: Nutrition: Goal: Adequate nutrition will be maintained Outcome: Not Progressing   Problem: Elimination: Goal: Will not experience complications related to bowel motility Outcome: Not Progressing Goal: Will not experience complications related to urinary retention Outcome: Not  Progressing

## 2024-04-19 NOTE — Consult Note (Signed)
 PHARMACY CONSULT NOTE - ELECTROLYTES  Pharmacy Consult for Electrolyte Monitoring and Replacement   Recent Labs: Height: 5' 9 (175.3 cm) Weight: 99.9 kg (220 lb 3.8 oz) IBW/kg (Calculated) : 70.7 Estimated Creatinine Clearance: 24.8 mL/min (A) (by C-G formula based on SCr of 2.86 mg/dL (H)). Potassium (mmol/L)  Date Value  04/19/2024 4.5  10/23/2014 4.1   Magnesium (mg/dL)  Date Value  92/74/7974 2.3   Calcium (mg/dL)  Date Value  92/74/7974 7.9 (L)   Calcium, Total (mg/dL)  Date Value  98/71/7983 9.0   Albumin  (g/dL)  Date Value  98/74/7975 4.1  08/18/2013 1.4 (L)   Phosphorus (mg/dL)  Date Value  92/74/7974 5.4 (H)   Sodium (mmol/L)  Date Value  04/19/2024 139  10/23/2014 144    Assessment  Nathan Rivera is a 78 y.o. male presenting with abdominal pain and hemoptysis. PMH significant for Infrarenal abdominal aortic aneurysm s/p endovascular repair of a ruptured AAA in 2011 on Xarelto , PAD, HTN, duodenal ulcer with perforation s/p omental patch repair, CKD stage III, thrombocytopenia, HDL, prior tobacco use, ESBL bacteremia, bladder cancer, chronic back pain, and bilateral inguinal hernia s/p repair . Pharmacy has been consulted to monitor and replace electrolytes.  Diet: NPO MIVF: N/A Pertinent medications: clevidipine  gtt, esmolol  gtt  Goal of Therapy: Electrolytes WNL  Plan:  No replacement currently indicated Check BMP, Mg, Phos with AM labs  Thank you for allowing pharmacy to be a part of this patient's care.  Nathan Rivera A Nathan Rivera 04/19/2024 7:19 AM

## 2024-04-19 NOTE — Progress Notes (Signed)
 NAME:  Nathan Rivera, MRN:  [id number], DOB:  [birth year], LOS: 4 ADMISSION DATE:  04/15/2024, CHIEF COMPLAINT:  Hemoptysis   History of Present Illness:   78 y.o male with significant PMH of Infrarenal abdominal aortic aneurysm s/p endovascular repair of a ruptured AAA in 2011 on Xarelto , PAD, HTN, duodenal ulcer with perforation s/p omental patch repair, CKD stage III, thrombocytopenia, HDL, prior tobacco use, ESBL bacteremia, bladder cancer, chronic back pain, and bilateral inguinal hernia s/p repair who presented to the ED with chief complaints of abdominal pain and hemoptysis.   Patient and family at bedside reports that he took his last dose of Xarelto  yesterday evening.  He subsequently developed some abdominal pain around the epigastric and left upper quadrant region.  Since onset of symptoms has been coughing up bright red blood which he describes  blood filling up his mouth.  Denies other associated symptoms   ED Course: Initial vital signs showed HR of 68 beats/minute, BP 192/102 mm Hg, the RR 18 breaths/minute, and the oxygen saturation 93% on RA and a temperature of 98.2F (36.7C). Pertinent Labs/Diagnostics Findings: Na+/ K+:140/4.2 Glucose:94  BUN/Cr.:39/2.43 TAR:Lwmzfjmxjaoz  PCT: negative <0.10  BNP: 394.5  CTA Chest> CT Abd/pelvis> Saccular aneurysm at the level of the proximal stent with some soft tissue thickening and stranding concerning possible endoleak or impending rupture,Large aneurysm involving the left common iliac artery, partially encompasses the left iliac stent, Aneurysmal dilatation of the right common iliac artery at the distal end of the stent  Multiple small aneurysms of the proximal internal and iliac vessels.    CT findings discussed with on-call vascular Dr. Marea who recommended hemodynamic monitoring as it is unlikely patient will require emergent intervention but may require repair during the course of admission. PCCM consulted for admission, BP management  and close monitoring.  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 Aorto bifemoral endograft placement for AAA,  Chronic right-sided low back pain without sciatica;  Thrombocytopenia  Raynaud's disease with gangrene  B12 deficiency;  Personal history of malignant neoplasm of bladder;  Tobacco use disorder;  Hematuria, gross  ESBL Bacteremia due to septic shock due to obstructive stone with Pyelonephritis bilateral Inguinal Hernia s/p repair (02/24)  Significant Hospital Events: Including procedures, antibiotic start and stop dates in addition to other pertinent events   7/21: admit to ICU, on clevidipine  and esmolol  7/22: no recurrence of hemoptysis, but he continues to have a cough. EGD normal without any PUD or sign of GI bleed 7/23: worsening renal function, holding on surgical intervention 7/24: kidney function stable, ileus, N/V in the AM. Sat in chair 7/25: slept well overnight, kidney function improved. No complaints   Interim History / Subjective:  Resting in bed, wife updated at the bedside. No bowel movements. Slept well overnight, delirium improved  Objective    Blood pressure 138/80, pulse 63, temperature 97.6 F (36.4 C), temperature source Oral, resp. rate 14, height 5' 9 (1.753 m), weight 99.9 kg, SpO2 98%.        Intake/Output Summary (Last 24 hours) at 04/19/2024 1235 Last data filed at 04/19/2024 1201 Gross per 24 hour  Intake 868.12 ml  Output 1000 ml  Net -131.88 ml   Filed Weights   04/17/24 0500 04/18/24 0600 04/19/24 0445  Weight: 97.3 kg 98.8 kg 99.9 kg  Examination: Physical Exam Constitutional:      Appearance: He is not ill-appearing.  Cardiovascular:     Rate and Rhythm: Normal rate and regular rhythm.     Pulses: Normal pulses.      Heart sounds: Normal heart sounds.  Pulmonary:     Effort: Pulmonary effort is normal.     Breath sounds: Rhonchi present.  Neurological:     General: No focal deficit present.     Mental Status: He is alert. Mental status is at baseline.     Assessment and Plan   #Hemoptysis #Endoleak with Aneurysm Expansion #History of Infrarenal AAA s/p endovascular repeair #HTN emergency #AKI on CKD #Community Acquired Pneumonia #Mild Cognitive Impairment  78 year old male presenting with an episode of hemoptysis (golf ball sized) with concern for hematemesis given history of GI bleed in the past. Patient was found to have a possible endoleak with threat of rupture and is admitted to the ICU for tight blood pressure control.  Neuro - history of mild cognitive impairment, holding memantine and donepezil  given bradycardia with esmolol . He had signs of delirium that was managed conservatively, improved this AM.  CV - history of hypertension, and presented with elevated blood pressure. Given concern for endovascular leak and risk of AAA rupture, He was initially under strict BP and HR control with esmolol  and clevidipine . Consulted with vascular surgery for evaluation for endovascular repair. He does have significant vascular disease (bilateral severe renal artery stenosis, severe celiac artery stenosis, infrarenal abdominal aortoiliac stent graft, aneurysmal dilation of the abdominal aorta at the level of the renal arteries with a saccular component concerning for leakage vs impending rupture, left common iliac artery aneurysm). Given renal disease and need for contrast, endoleak repair is delayed, and he will undergo repair of renal artery stenosis today. BP goals have been liberalized to allow for renal perfusion.  -goal SBP of 130  -goal HR < 60  Pulm - I have personally reviewed his chest CT and note multiple tree in bud nodules as well as minimal ground glass opacities that were scattered,  concerning for viral infection versus bacterial pneumonia/bronchitis. He is on nasal cannula, and given fluid intake and renal dysfunction is at risk for pulmonary edema. Respiratory status is improved today with mobilization and incentive spirometry. Antibiotics and infectious workup per ID section. There has been no recurrence of hemoptysis. GI - Underwent EGD that did not show any PUD or sign of GI bleed. Discontinued high dose PPI. CT abdomen with signs of ileus. Tried NG tube but patient unable to tolerate. Will maintain NPO and minimize narcotic use. Renal - AKI on CKD, with bilateral renal artery stenosis. This was likely exacerbated by contrast exposure. Will hold IV fluids for now given increase in oxygen requirements. We have to balance his blood pressure requirements (need to lower to decrease risk of aneurysmal rupture vs need for BP to perfuse the kidneys). BP goal liberalized to 130 systolic. Kidney function is mildly improved today. Seen by nephrology yesterday, input appreciated. Endo - ICU glycemic protocol Hem/Onc - on home rivaroxaban , holding given hemoptysis. H/H stable ID - Viral panel negative, on Ceftriaxone  and Azithromycin  for CAP coverage. Sputum culture no growth.   Best Practice (right click and Reselect all SmartList Selections daily)   Diet/type: NPO DVT prophylaxis not indicated Pressure ulcer(s): N/A GI prophylaxis: N/A Lines: N/A Foley:  N/A Code Status:  full code Last date of multidisciplinary goals of care discussion [04/19/2024]  Labs   CBC:  Recent Labs  Lab 04/15/24 1447 04/16/24 0339 04/17/24 0836 04/18/24 0324 04/19/24 0232  WBC 9.1 10.2 11.2* 11.0* 9.1  NEUTROABS  --   --  8.9* 8.7* 7.3  HGB 15.3 14.5 16.6 14.3 12.7*  HCT 47.3 44.0 RESULTS UNAVAILABLE DUE TO INTERFERING SUBSTANCE 42.5 38.9*  MCV 91.7 91.3 RESULTS UNAVAILABLE DUE TO INTERFERING SUBSTANCE 90.6 92.8  PLT 160 165 156 144* 117*    Basic Metabolic Panel: Recent Labs  Lab  04/15/24 1447 04/16/24 0339 04/17/24 0357 04/18/24 0324 04/19/24 0232  NA 140 142 140 139 139  K 4.2 4.2 4.4 4.1 4.5  CL 105 108 107 107 111  CO2 24 24 20* 21* 19*  GLUCOSE 94 96 102* 88 76  BUN 39* 43* 50* 50* 54*  CREATININE 2.43* 2.44* 3.08* 3.20* 2.86*  CALCIUM 9.4 8.8* 8.7* 8.4* 7.9*  MG  --  2.3 2.3 2.1 2.3  PHOS  --  3.9 4.3 5.1* 5.4*   GFR: Estimated Creatinine Clearance: 24.8 mL/min (A) (by C-G formula based on SCr of 2.86 mg/dL (H)). Recent Labs  Lab 04/15/24 2030 04/15/24 2218 04/16/24 0339 04/17/24 0836 04/18/24 0324 04/19/24 0232  PROCALCITON <0.10  --  <0.10  --   --   --   WBC  --   --  10.2 11.2* 11.0* 9.1  LATICACIDVEN 0.9 1.2  --   --   --   --     Liver Function Tests: No results for input(s): AST, ALT, ALKPHOS, BILITOT, PROT, ALBUMIN  in the last 168 hours. No results for input(s): LIPASE, AMYLASE in the last 168 hours. No results for input(s): AMMONIA in the last 168 hours.  ABG    Component Value Date/Time   PHART 7.273 (L) 08/24/2013 2337   PCO2ART 28.9 (L) 08/24/2013 2337   PO2ART 84.0 08/24/2013 2337   HCO3 13.4 (L) 08/24/2013 2337   TCO2 14 08/24/2013 2337   ACIDBASEDEF 12.0 (H) 08/24/2013 2337   O2SAT 95.0 08/24/2013 2337     Coagulation Profile: Recent Labs  Lab 04/15/24 1806 04/18/24 0324  INR 1.3* 1.2    Cardiac Enzymes: No results for input(s): CKTOTAL, CKMB, CKMBINDEX, TROPONINI in the last 168 hours.  HbA1C: No results found for: HGBA1C  CBG: Recent Labs  Lab 04/15/24 2153  GLUCAP 100*    Review of Systems:   N/A  Past Medical History:  He,  has a past medical history of Alzheimer dementia (HCC), Aortic aneurysm (HCC), Arthritis, Cancer (HCC) (2012), CKD (chronic kidney disease), stage III (HCC) (2014), Degenerative disc disease, Degenerative joint disease, GERD (gastroesophageal reflux disease), History of kidney stones, Hypercholesterolemia, Hypertension, Impaired glucose tolerance,  Muscle fatigue, Peripheral vascular disease (HCC), Pneumonia, Raynaud's disease, Sepsis (HCC) (07/2013), Stroke [facility]), Thromboembolism of left popilteal vein (2015), and UTI (urinary tract infection) (08/17/2013).   Surgical History:   Past Surgical History:  Procedure Laterality Date   ABDOMINAL AORTIC ANEURYSM REPAIR  2011   CATARACT EXTRACTION W/PHACO Left 02/15/2021   Procedure: CATARACT EXTRACTION PHACO AND INTRAOCULAR LENS PLACEMENT (IOC) LEFT 5.59 00:44.0;  Surgeon: Myrna Adine Anes, MD;  Location: [facility];  Service: Ophthalmology;  Laterality: Left;   CATARACT EXTRACTION W/PHACO Right 03/08/2021   Procedure: CATARACT EXTRACTION PHACO AND INTRAOCULAR LENS PLACEMENT (IOC) RIGHT 1.81 00:22.7;  Surgeon: Myrna Adine Anes, MD;  Location: [facility];  Service: Ophthalmology;  Laterality: Right;   ESOPHAGOGASTRODUODENOSCOPY N/A 04/16/2024   Procedure: EGD (ESOPHAGOGASTRODUODENOSCOPY);  Surgeon: Jinny Carmine, MD;  Location: [facility];  Service: Endoscopy;  Laterality: N/A;   HERNIA REPAIR     21mo old   INSERTION OF MESH N/A 10/24/2014   Procedure: INSERTION OF MESH;  Surgeon: Deward Null III, MD;  Location: [facility] OR;  Service: General;  Laterality: N/A;   LAPAROTOMY N/A 08/24/2013   Procedure: EXPLORATORY LAPAROTOMY;  Surgeon: Deward GORMAN Null DOUGLAS, MD;  Location: [facility] OR;  Service: General;  Laterality: N/A;  Gram patch closure   litrotripsy  09/24/2013   NEPHROSTOMY  Nov. 2014   nephrostomy removal  Dec. 2014   PERIPHERALLY INSERTED CENTRAL CATHETER INSERTION     TONSILLECTOMY     UMBILICAL HERNIA REPAIR     50 yr ago   VENTRAL HERNIA REPAIR N/A 10/24/2014   Procedure: LAPAROSCOPIC ASSISTED VENTRAL HERNIA REPAIR WITH MESH;  Surgeon: Deward Null III, MD;  Location: [facility] OR;  Service: General;  Laterality: N/A;   XI ROBOTIC ASSISTED INGUINAL HERNIA REPAIR WITH MESH Bilateral 11/17/2022   Procedure: XI ROBOTIC ASSISTED INGUINAL HERNIA REPAIR WITH MESH-Bilateral;  Surgeon: Desiderio Schanz, MD;  Location: [facility] ORS;  Service: General;  Laterality: Bilateral;     Social History:   reports that he has been smoking cigarettes. He has a 40 pack-year smoking history. He has been exposed to tobacco smoke. He has never used smokeless tobacco. He reports current alcohol use of about 3.0 standard drinks of alcohol per week. He reports that he does not use drugs.   Family History:  His family history includes Atrial fibrillation in his sister; CVA in his father.   Allergies Allergies  Allergen Reactions   Duloxetine Dermatitis     Home Medications  Prior to Admission medications   Medication Sig Start Date End Date Taking? Authorizing Provider  acetaminophen  (TYLENOL ) 325 MG tablet Take 325 mg by mouth every 6 (six) hours as needed for mild pain.   Yes [provider]  amLODipine  (NORVASC ) 5 MG tablet TAKE 1 TABLET BY MOUTH  DAILY 12/24/19  Yes Brown, Fallon E, NP  cholecalciferol (VITAMIN D) 1000 UNITS tablet Take 1,000 Units by mouth daily.   Yes [provider]  Cyanocobalamin (VITAMIN B-12 ER PO) Take by mouth.   Yes [provider]  donepezil  (ARICEPT ) 10 MG tablet Take 10 mg by mouth at bedtime.   Yes [provider]  FOLIC ACID PO Take by mouth.   Yes [provider]  gabapentin  (NEURONTIN ) 100 MG capsule 200 mg 2 (two) times daily. 200 mg in am and 200 mg in PM 04/01/17  Yes [provider]  Lactobacillus ([employer] PROBIOTIC ACIDOPHILUS) 10 MG CAPS Take by mouth.   Yes [provider]  memantine (NAMENDA) 10 MG tablet TAKE 1 TABLET BY MOUTH TWICE DAILY FOR MEMORY LOSS 02/28/24  Yes [provider]  pantoprazole  (PROTONIX ) 40 MG tablet Take 40 mg by mouth at bedtime.  11/13/13  Yes [provider]  simvastatin  (ZOCOR ) 20 MG tablet Take 20 mg by mouth at bedtime.   Yes [provider]  vitamin B-12 (CYANOCOBALAMIN) 500 MCG tablet Take 500 mcg by mouth daily.   Yes [provider]   XARELTO  20 MG TABS tablet daily.  12/23/13  Yes [provider]  amoxicillin -clavulanate (AUGMENTIN ) 875-125 MG tablet :1 Tablet(s) By Mouth Every 12 Hours Patient not taking: Reported on 04/15/2024 02/20/24   [provider]  pregabalin (LYRICA) 100 MG capsule Take 100 mg by mouth 2 (two) times daily. Patient not taking: Reported on 04/15/2024 12/20/23   [provider]  The patient is critically ill due to renal failure, endoleak, AAA.  Critical care was necessary to treat or prevent imminent or life-threatening deterioration. Critical care time was spent by me on the following activities: development of a treatment plan with the patient and/or surrogate as well as nursing, discussions with consultants, evaluation of the patient's response to treatment, examination of the patient, obtaining a history from the patient or surrogate, ordering and performing treatments and interventions, ordering and review of laboratory studies, ordering and review of radiographic studies, review of telemetry data including pulse oximetry, re-evaluation of patient's condition and participation in multidisciplinary rounds.   I personally spent 35 minutes providing critical care not including any separately billable procedures.   Belva November, MD [facility] 04/19/2024 12:44 PM

## 2024-04-19 NOTE — H&P (View-Only) (Signed)
 Progress Note    04/19/2024 11:15 AM 3 Days Post-Op  Subjective:  Nathan Rivera is a 78 yo male  with significant PMH of Infrarenal abdominal aortic aneurysm s/p endovascular repair of a ruptured AAA in 2011 on Xarelto , PAD, HTN, duodenal ulcer with perforation s/p omental patch repair, CKD stage III, thrombocytopenia, HDL, prior tobacco use, ESBL bacteremia, bladder cancer, chronic back pain, and bilateral inguinal hernia s/p repair who presented to the ED with chief complaints of abdominal pain and hemoptysis.    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 Also noted was right iliac dilatation of 2.8 cm at the distal end of the stent. Vascular surgery was consulted to evaluate.    Plan was to take the patient to the vascular lab today 04/17/24 for repair of the above mentioned aneurysms. However on daily blood work shows the patient is noted to have an elevation in creatine from 2.3 to 3.1 Thursday morning. This cancelled the procedure. ICU team was aware and has been asked to consult Nephrology. Nephrology recommends watching BUN/Creatine. If improves no dialysis at this time.   Today patients Bun /Creatine is down to 54/2.86  Plan is to proceed with fixing one side of the patient renal stenosis in the vascular lab later today. Dr Selinda Gu to decide whether the left or the right side to be fixed first.   Vitals:   04/19/24 1045 04/19/24 1100  BP: 139/76 (!) 143/76  Pulse:  67  Resp: 13 12  Temp:    SpO2:  97%   Physical Exam: Cardiac:  Regular Rate but bradycardic at 40 bpm's. No murmurs appreciated.  Lungs:  Non labored clear throughout on auscultation.  No rales rhonchi or wheezing. Incisions:  None Extremities: All extremities with palpable pulses and warm to touch. Abdomen: Positive bowel sounds throughout, soft, nontender and nondistended Neurologic: Alert and oriented x 3, answers all questions and follows commands appropriately.  CBC    Component Value Date/Time   WBC 9.1 04/19/2024 0232   RBC 4.19 (L) 04/19/2024 0232   HGB 12.7 (L) 04/19/2024 0232   HGB 10.3 (L) 08/18/2013 0504   HCT 38.9 (L) 04/19/2024 0232   HCT 30.9 (L) 08/18/2013 0504   PLT 117 (L) 04/19/2024 0232   PLT 161 08/18/2013 0504   MCV 92.8 04/19/2024 0232   MCV 86 08/18/2013 0504   MCH 30.3 04/19/2024 0232   MCHC 32.6 04/19/2024 0232   RDW 15.5 04/19/2024 0232   RDW 16.0 (H) 08/18/2013 0504   LYMPHSABS 0.9 04/19/2024 0232   LYMPHSABS 2.3 08/18/2013 0504   MONOABS 0.7 04/19/2024 0232   MONOABS 1.1 (H) 08/18/2013 0504   EOSABS 0.1 04/19/2024 0232   EOSABS 0.2 08/18/2013 0504   BASOSABS 0.0 04/19/2024 0232   BASOSABS 0.0 08/18/2013 0504    BMET    Component Value Date/Time   NA 139 04/19/2024 0232   NA 144 10/23/2014 1630   K 4.5 04/19/2024 0232   K 4.1 10/23/2014 1630   CL 111 04/19/2024 0232   CL 112 (H) 10/23/2014 1630   CO2 19 (L) 04/19/2024 0232   CO2 26 10/23/2014 1630   GLUCOSE 76 04/19/2024 0232   GLUCOSE 83 10/23/2014 1630  BUN 54 (H) 04/19/2024 0232   BUN 30 (H) 10/23/2014 1630   CREATININE 2.86 (H) 04/19/2024 0232   CREATININE 1.96 (H) 10/23/2014 1630   CALCIUM 7.9 (L) 04/19/2024 0232   CALCIUM 9.0 10/23/2014 1630   GFRNONAA 22 (L) 04/19/2024 0232   GFRNONAA 36 (L) 10/23/2014 1630   GFRNONAA 8 (L) 08/18/2013 0504   GFRAA 39 (L) 10/25/2014 0352   GFRAA 44 (L) 10/23/2014 1630   GFRAA 9 (L) 08/18/2013 0504    INR    Component Value Date/Time   INR 1.2 04/18/2024 0324   INR 1.5 10/20/2014 0833     Intake/Output Summary (Last 24 hours) at 04/19/2024 1115 Last data filed at 04/19/2024 1101 Gross per 24 hour  Intake 882.15 ml  Output 850 ml  Net 32.15 ml      Assessment/Plan:  78 y.o. male presented to [facility] emergency department with chief complaint of abdominal pain and hemoptysis. Upon workup the patient underwent a CTA of the chest abdomen pelvis. 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 Cordella Shawl MD. 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 There is also aneurysmal dilatation of the right common iliac artery at the distal end of the stent measuring 2.8 cm. There is also severe stenosis at the origin of both renal arteries.  Patient's BUN and creatinine have been elevated over the past 3 days.  Today is the first day of patient's creatinine has come down from 3.20-2.86.   Therefore we will plan to take the patient to the vascular lab today 04/19/2024 for renal arteriogram with possible renal intervention such as stent placement to correct renal stenosis on either the right or the left side determined by Dr. Selinda Gu MD who is doing the procedure.  3 Days Post-Op   I do long detailed discussion at the bedside this morning with the patient and his wife regarding his current condition.  We again this morning discussed his renal stenosis and how this needs to be fixed prior to intervening and fixing any of his abdominal aortic aneurysms that need to be repaired as well as iliac aneurysms that need to be repaired.  Both verbalized there understanding.  They were both made aware that the patient's creatinine has come down today from yesterday and therefore we feel fixing of one of his kidneys arterial stenosis can be done today.  I discussed in detail with the patient and his wife at the bedside this morning the procedure, benefits, risk, and complications.  One of those complications being he may need temporary dialysis due to an acute kidney injury from the use of  dye to complete the procedure.  Both verbalized understanding wish to proceed today.  I answered all their questions this morning.  Patient has been n.p.o. since midnight last night.  Patient's blood pressure and heart rate have been in good control.  Will proceed with the procedure later this afternoon.  I discussed the case in detail with Dr. Selinda Gu MD and he agrees with the plan.  DVT prophylaxis:  None   Vonette Grosso R Annlouise [facility]re[facility] 04/19/2024 11:15 AM

## 2024-04-19 NOTE — Progress Notes (Signed)
 Progress Note    04/19/2024 11:15 AM 3 Days Post-Op  Subjective:  Nathan Rivera is a 78 yo male  with significant PMH of Infrarenal abdominal aortic aneurysm s/p endovascular repair of a ruptured AAA in 2011 on Xarelto , PAD, HTN, duodenal ulcer with perforation s/p omental patch repair, CKD stage III, thrombocytopenia, HDL, prior tobacco use, ESBL bacteremia, bladder cancer, chronic back pain, and bilateral inguinal hernia s/p repair who presented to the ED with chief complaints of abdominal pain and hemoptysis.    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 Also noted was right iliac dilatation of 2.8 cm at the distal end of the stent. Vascular surgery was consulted to evaluate.    Plan was to take the patient to the vascular lab today 04/17/24 for repair of the above mentioned aneurysms. However on daily blood work shows the patient is noted to have an elevation in creatine from 2.3 to 3.1 Thursday morning. This cancelled the procedure. ICU team was aware and has been asked to consult Nephrology. Nephrology recommends watching BUN/Creatine. If improves no dialysis at this time.   Today patients Bun /Creatine is down to 54/2.86  Plan is to proceed with fixing one side of the patient renal stenosis in the vascular lab later today. Dr Selinda Gu to decide whether the left or the right side to be fixed first.   Vitals:   04/19/24 1045 04/19/24 1100  BP: 139/76 (!) 143/76  Pulse:  67  Resp: 13 12  Temp:    SpO2:  97%   Physical Exam: Cardiac:  Regular Rate but bradycardic at 40 bpm's. No murmurs appreciated.  Lungs:  Non labored clear throughout on auscultation.  No rales rhonchi or wheezing. Incisions:  None Extremities: All extremities with palpable pulses and warm to touch. Abdomen: Positive bowel sounds throughout, soft, nontender and nondistended Neurologic: Alert and oriented x 3, answers all questions and follows commands appropriately.  CBC    Component Value Date/Time   WBC 9.1 04/19/2024 0232   RBC 4.19 (L) 04/19/2024 0232   HGB 12.7 (L) 04/19/2024 0232   HGB 10.3 (L) 08/18/2013 0504   HCT 38.9 (L) 04/19/2024 0232   HCT 30.9 (L) 08/18/2013 0504   PLT 117 (L) 04/19/2024 0232   PLT 161 08/18/2013 0504   MCV 92.8 04/19/2024 0232   MCV 86 08/18/2013 0504   MCH 30.3 04/19/2024 0232   MCHC 32.6 04/19/2024 0232   RDW 15.5 04/19/2024 0232   RDW 16.0 (H) 08/18/2013 0504   LYMPHSABS 0.9 04/19/2024 0232   LYMPHSABS 2.3 08/18/2013 0504   MONOABS 0.7 04/19/2024 0232   MONOABS 1.1 (H) 08/18/2013 0504   EOSABS 0.1 04/19/2024 0232   EOSABS 0.2 08/18/2013 0504   BASOSABS 0.0 04/19/2024 0232   BASOSABS 0.0 08/18/2013 0504    BMET    Component Value Date/Time   NA 139 04/19/2024 0232   NA 144 10/23/2014 1630   K 4.5 04/19/2024 0232   K 4.1 10/23/2014 1630   CL 111 04/19/2024 0232   CL 112 (H) 10/23/2014 1630   CO2 19 (L) 04/19/2024 0232   CO2 26 10/23/2014 1630   GLUCOSE 76 04/19/2024 0232   GLUCOSE 83 10/23/2014 1630  BUN 54 (H) 04/19/2024 0232   BUN 30 (H) 10/23/2014 1630   CREATININE 2.86 (H) 04/19/2024 0232   CREATININE 1.96 (H) 10/23/2014 1630   CALCIUM 7.9 (L) 04/19/2024 0232   CALCIUM 9.0 10/23/2014 1630   GFRNONAA 22 (L) 04/19/2024 0232   GFRNONAA 36 (L) 10/23/2014 1630   GFRNONAA 8 (L) 08/18/2013 0504   GFRAA 39 (L) 10/25/2014 0352   GFRAA 44 (L) 10/23/2014 1630   GFRAA 9 (L) 08/18/2013 0504    INR    Component Value Date/Time   INR 1.2 04/18/2024 0324   INR 1.5 10/20/2014 0833     Intake/Output Summary (Last 24 hours) at 04/19/2024 1115 Last data filed at 04/19/2024 1101 Gross per 24 hour  Intake 882.15 ml  Output 850 ml  Net 32.15 ml      Assessment/Plan:  78 y.o. male presented to [facility] emergency department with chief complaint of abdominal pain and hemoptysis. Upon workup the patient underwent a CTA of the chest abdomen pelvis. 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 Cordella Shawl MD. 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 There is also aneurysmal dilatation of the right common iliac artery at the distal end of the stent measuring 2.8 cm. There is also severe stenosis at the origin of both renal arteries.  Patient's BUN and creatinine have been elevated over the past 3 days.  Today is the first day of patient's creatinine has come down from 3.20-2.86.   Therefore we will plan to take the patient to the vascular lab today 04/19/2024 for renal arteriogram with possible renal intervention such as stent placement to correct renal stenosis on either the right or the left side determined by Dr. Selinda Gu MD who is doing the procedure.  3 Days Post-Op   I do long detailed discussion at the bedside this morning with the patient and his wife regarding his current condition.  We again this morning discussed his renal stenosis and how this needs to be fixed prior to intervening and fixing any of his abdominal aortic aneurysms that need to be repaired as well as iliac aneurysms that need to be repaired.  Both verbalized there understanding.  They were both made aware that the patient's creatinine has come down today from yesterday and therefore we feel fixing of one of his kidneys arterial stenosis can be done today.  I discussed in detail with the patient and his wife at the bedside this morning the procedure, benefits, risk, and complications.  One of those complications being he may need temporary dialysis due to an acute kidney injury from the use of  dye to complete the procedure.  Both verbalized understanding wish to proceed today.  I answered all their questions this morning.  Patient has been n.p.o. since midnight last night.  Patient's blood pressure and heart rate have been in good control.  Will proceed with the procedure later this afternoon.  I discussed the case in detail with Dr. Selinda Gu MD and he agrees with the plan.  DVT prophylaxis:  None   Vonette Grosso R [facility] 04/19/2024 11:15 AM

## 2024-04-19 NOTE — Interval H&P Note (Signed)
 History and Physical Interval Note:  04/19/2024 12:56 PM  Nathan Rivera  has presented today for surgery, with the diagnosis of Renal Stenosis.  The various methods of treatment have been discussed with the patient and family. After consideration of risks, benefits and other options for treatment, the patient has consented to  Procedure(s): RENAL ANGIOGRAPHY (Bilateral) as a surgical intervention.  The patient's history has been reviewed, patient examined, no change in status, stable for surgery.  I have reviewed the patient's chart and labs.  Questions were answered to the patient's satisfaction.     Gawain Crombie

## 2024-04-19 NOTE — Op Note (Signed)
 Nathan Rivera  Percutaneous Study/Intervention Procedural Note    Surgeon(s): [employer]   Assistants: None  Pre-operative Diagnosis: Bilateral renal artery stenosis, renovascular hypertension,   Post-operative diagnosis:  Same with occlusion of the right renal artery and possible occlusion of the left renal artery  Procedure(s) Performed:             1.  Ultrasound guidance for vascular access right femoral artery             2.  Catheter placement into right renal artery from right femoral approach             3.  Aortogram and selective right renal angiogram             4.  Balloon expandable stent placement to the right renal artery with a 7 mm diameter x 26 mm length Lifestream stent             5.  StarClose closure device right femoral artery              Contrast: 70 cc  EBL: 10 cc   Fluoro Time: 23.6 minutes  Moderate conscious sedation: Approximately 70 minutes with 1 mg of Versed  and 37.5 mcg of Fentanyl   Indications:  The patient is a 78 year old male with severe hypertension and ischemic nephropathy with a previous CT scan suggesting severe bilateral renal artery stenosis with worsening severe hypertension despite multiple home medications. The patient has suboptimal blood pressure control despite multiple antihypertensives and a noninvasive study demonstrating hemodynamically significant bilateral renal artery stenosis.  He was admitted to the hospital with a hypertensive crisis and also found to have a large type Ib endoleak with his previously repaired abdominal aortic aneurysm as given the clinical scenario and the noninvasive findings, angiogram is indicated for further evaluation of her renal artery and potential treatment. Risks and benefits are discussed and informed consent is obtained.  Procedure:  The patient was identified and appropriate procedural time out was performed.  The patient was then placed supine on the table and prepped and  draped in the usual sterile fashion. Moderate conscious sedation was administered with a face to face encounter with the patient throughout the procedure with my supervision of the RN administering medicines and monitoring the patients vital signs and mental status throughout from the start of the procedure until the patient was taken to the recovery room  Ultrasound was used to evaluate the right common femoral artery.  It was patent .  A digital ultrasound image was acquired.  A Seldinger needle was used to access the right common femoral artery under direct ultrasound guidance and a permanent image was performed.  A 0.035 J wire was advanced without resistance and a 5Fr sheath was placed.  Pigtail catheter was placed into the aorta at the L1 level and an AP aortogram was performed. This demonstrated exceedingly poor images due to patient motion and bowel gas.  The origins of the renal arteries could not really be seen on the aortogram. The patient was then systemically heparinized with 4000 units of intravenous heparin .  Attempts at cannulating the left renal arteries with a variety of catheters including a V S1 catheter, Kumpe catheter, C2 catheter were all basically unsuccessful.  I never demonstrated an image showing any flow in the right renal artery despite multiple attempts to evaluate this.  From evaluating the CT scan, this was higher up on the aortic wall from the stent graft but had never  demonstrated a patent left renal artery or what degree of stenosis was in the left renal artery.  I then turned my attention to cannulate the right renal artery which is just above the stent graft on the CT scan. I used a renal double curve catheter to cannulate the right renal artery and selective imaging was performed. This actually demonstrated an occlusion of the right renal artery.  At this point I selected the glide wire and crossed the lesion without difficulty.  I was unable to get catheter purchase across this  occlusion even with a glide catheter.  I then exchanged for a V18 wire and was able to cross the occlusion.  I then predilated the lesion with a 4 mm diameter by 4 cm length angioplasty balloon inflated to 12 atm for 1 minute.  At this point, I was able to get a glide catheter across the occlusion and confirm intraluminal flow in the distal main renal artery with a good nephrogram seen.  I then placed a supra core wire and a 6 [place] Ansell sheath over the supra core wire into the right renal artery I then selected a 7 mm diameter x 26 mm length balloon expandable stent and brought this across the lesion.  This was deployed encompassing the lesion with its proximal extent going back into the aorta for a mm or two.  This was inflated to 12 ATM and the waist resolved.  Completion angiogram showed less than 10 percent residual stenosis and a brisk nephrogram.  Oblique arteriogram was performed of the right femoral artery and StarClose closure device was deployed in the usual fashion with excellent hemostatic result. The patient was taken to the recovery room in stable condition having tolerated the procedure well.  Findings:               Aortogram/Renal Arteries: Aortic aneurysm present with no type Ia endoleak proximally.  There is a type Ib endoleak on the left side.  The left renal arteries appear to be occluded with no evidence of flow and no ability to successfully cannulate the left renal artery and demonstrate flow.  The right renal artery was occluded but was able to be opened and stented today   Condition:  Stable  Complications: None   Nathan Rivera 04/19/2024 3:10 PM  This note was created with Dragon Medical transcription system. Any errors in dictation are purely unintentional.

## 2024-04-19 NOTE — Progress Notes (Signed)
      Daily Progress Note   R groin: no hematoma, some bleeding on bandage, no obvious bruit  Pt on Esmolol  drip  - No intervention needed at this point   Redell Door, MD, FACS, FSVS Covering for [facility]: [phone]  04/19/2024, 9:40 PM

## 2024-04-20 DIAGNOSIS — I161 Hypertensive emergency: Secondary | ICD-10-CM | POA: Diagnosis not present

## 2024-04-20 DIAGNOSIS — R042 Hemoptysis: Secondary | ICD-10-CM | POA: Diagnosis not present

## 2024-04-20 DIAGNOSIS — T82310A Breakdown (mechanical) of aortic (bifurcation) graft (replacement), initial encounter: Secondary | ICD-10-CM | POA: Diagnosis not present

## 2024-04-20 DIAGNOSIS — N179 Acute kidney failure, unspecified: Secondary | ICD-10-CM | POA: Diagnosis not present

## 2024-04-20 LAB — CULTURE, BLOOD (ROUTINE X 2)
Culture: NO GROWTH
Culture: NO GROWTH
Special Requests: ADEQUATE

## 2024-04-20 LAB — PREPARE RBC (CROSSMATCH)

## 2024-04-20 LAB — CBC
HCT: 41.8 % (ref 39.0–52.0)
Hemoglobin: 13.4 g/dL (ref 13.0–17.0)
MCH: 30 pg (ref 26.0–34.0)
MCHC: 32.1 g/dL (ref 30.0–36.0)
MCV: 93.5 fL (ref 80.0–100.0)
Platelets: 140 K/uL — ABNORMAL LOW (ref 150–400)
RBC: 4.47 MIL/uL (ref 4.22–5.81)
RDW: 15.4 % (ref 11.5–15.5)
WBC: 8.8 K/uL (ref 4.0–10.5)
nRBC: 0 % (ref 0.0–0.2)

## 2024-04-20 LAB — GLUCOSE, CAPILLARY
Glucose-Capillary: 103 mg/dL — ABNORMAL HIGH (ref 70–99)
Glucose-Capillary: 114 mg/dL — ABNORMAL HIGH (ref 70–99)
Glucose-Capillary: 132 mg/dL — ABNORMAL HIGH (ref 70–99)
Glucose-Capillary: 59 mg/dL — ABNORMAL LOW (ref 70–99)
Glucose-Capillary: 61 mg/dL — ABNORMAL LOW (ref 70–99)
Glucose-Capillary: 87 mg/dL (ref 70–99)
Glucose-Capillary: 94 mg/dL (ref 70–99)

## 2024-04-20 LAB — TRIGLYCERIDES: Triglycerides: 148 mg/dL (ref <150)

## 2024-04-20 LAB — BASIC METABOLIC PANEL WITH GFR
Anion gap: 12 (ref 5–15)
BUN: 50 mg/dL — ABNORMAL HIGH (ref 8–23)
CO2: 17 mmol/L — ABNORMAL LOW (ref 22–32)
Calcium: 8.5 mg/dL — ABNORMAL LOW (ref 8.9–10.3)
Chloride: 111 mmol/L (ref 98–111)
Creatinine, Ser: 2.64 mg/dL — ABNORMAL HIGH (ref 0.61–1.24)
GFR, Estimated: 24 mL/min — ABNORMAL LOW (ref >60)
Glucose, Bld: 61 mg/dL — ABNORMAL LOW (ref 70–99)
Potassium: 4.4 mmol/L (ref 3.5–5.1)
Sodium: 140 mmol/L (ref 135–145)

## 2024-04-20 LAB — MAGNESIUM: Magnesium: 2.3 mg/dL (ref 1.7–2.4)

## 2024-04-20 LAB — PHOSPHORUS: Phosphorus: 4.7 mg/dL — ABNORMAL HIGH (ref 2.5–4.6)

## 2024-04-20 MED ORDER — DEXTROSE 50 % IV SOLN: 25.0000 mL | Freq: Once | INTRAVENOUS | Status: AC

## 2024-04-20 MED ORDER — DEXTROSE 50 % IV SOLN
INTRAVENOUS | Status: AC
  Administered 2024-04-20: 25 mL via INTRAVENOUS
  Filled 2024-04-20: qty 50

## 2024-04-20 MED ORDER — DEXTROSE-SODIUM CHLORIDE 5-0.45 % IV SOLN: INTRAVENOUS | Status: AC

## 2024-04-20 NOTE — Plan of Care (Signed)
  Problem: Clinical Measurements: Goal: Ability to maintain clinical measurements within normal limits will improve Outcome: Progressing Goal: Will remain free from infection Outcome: Progressing Goal: Diagnostic test results will improve Outcome: Progressing Goal: Respiratory complications will improve Outcome: Progressing Goal: Cardiovascular complication will be avoided Outcome: Progressing   Problem: Activity: Goal: Risk for activity intolerance will decrease Outcome: Progressing   Problem: Nutrition: Goal: Adequate nutrition will be maintained Outcome: Progressing   Problem: Coping: Goal: Level of anxiety will decrease Outcome: Progressing   Problem: Elimination: Goal: Will not experience complications related to urinary retention Outcome: Progressing   Problem: Pain Managment: Goal: General experience of comfort will improve and/or be controlled Outcome: Progressing   Problem: Safety: Goal: Ability to remain free from injury will improve Outcome: Progressing   Problem: Elimination: Goal: Will not experience complications related to bowel motility Outcome: Not Progressing

## 2024-04-20 NOTE — Progress Notes (Signed)
 [facility]  ROUNDING NOTE   Subjective:   Patient with past medical history of peripheral arterial disease, hypertension, abdominal aortic aneurysm, hyperlipidemia, bladder cancer, thrombocytopenia who was admitted with hypertensive crisis, abdominal aneurysm, type I endoleak of aortic graft.  Update: The patient's renal function has improved. Creatinine down to 2.6. Urine output recorded 1375 cc.   07/25 0701 - 07/26 0700 In: 1382.1 [I.V.:882.1; IV Piggyback:500] Out: 1375 [Urine:1375] Lab Results  Component Value Date   CREATININE 2.64 (H) 04/20/2024   CREATININE 2.86 (H) 04/19/2024   CREATININE 3.20 (H) 04/18/2024     Objective:  Vital signs in last 24 hours:  Temp:  [97.4 F (36.3 C)-98.2 F (36.8 C)] 97.8 F (36.6 C) (07/26 0830) Pulse Rate:  [51-74] 52 (07/26 1415) Resp:  [10-23] 15 (07/26 1415) BP: (112-159)/(59-109) 134/69 (07/26 1415) SpO2:  [90 %-100 %] 98 % (07/26 1415) Weight:  [99.5 kg] 99.5 kg (07/26 0429)  Weight change: 0 kg Filed Weights   04/19/24 0445 04/19/24 1232 04/20/24 0429  Weight: 99.9 kg 99.9 kg 99.5 kg    Intake/Output: I/O last 3 completed shifts: In: 1886.7 [I.V.:1086.7; IV Piggyback:800] Out: 1825 [Urine:1825]   Intake/Output this shift:  Total I/O In: 754.1 [P.O.:50; I.V.:604.1; IV Piggyback:100] Out: 400 [Urine:400]  Physical Exam: General: No acute distress  Head: Normocephalic, atraumatic. Moist oral mucosal membranes  Neck: Supple  Lungs:  Scattered rhonchi, normal effort  Heart: S1S2 no rubs  Abdomen:  Soft, nontender, bowel sounds present  Extremities: Trace peripheral edema.  Neurologic: Awake, alert, following commands  Skin: No acute rash  Access: No hemodialysis access  External urinary collection system  Basic Metabolic Panel: Recent Labs  Lab 04/16/24 0339 04/17/24 0357 04/18/24 0324 04/19/24 0232 04/20/24 0520  NA 142 140 139 139 140  K 4.2 4.4 4.1 4.5 4.4  CL 108 107 107 111 111   CO2 24 20* 21* 19* 17*  GLUCOSE 96 102* 88 76 61*  BUN 43* 50* 50* 54* 50*  CREATININE 2.44* 3.08* 3.20* 2.86* 2.64*  CALCIUM 8.8* 8.7* 8.4* 7.9* 8.5*  MG 2.3 2.3 2.1 2.3 2.3  PHOS 3.9 4.3 5.1* 5.4* 4.7*    Liver Function Tests: No results for input(s): AST, ALT, ALKPHOS, BILITOT, PROT, ALBUMIN  in the last 168 hours. No results for input(s): LIPASE, AMYLASE in the last 168 hours. No results for input(s): AMMONIA in the last 168 hours.  CBC: Recent Labs  Lab 04/16/24 0339 04/17/24 0836 04/18/24 0324 04/19/24 0232 04/20/24 0520  WBC 10.2 11.2* 11.0* 9.1 8.8  NEUTROABS  --  8.9* 8.7* 7.3  --   HGB 14.5 16.6 14.3 12.7* 13.4  HCT 44.0 RESULTS UNAVAILABLE DUE TO INTERFERING SUBSTANCE 42.5 38.9* 41.8  MCV 91.3 RESULTS UNAVAILABLE DUE TO INTERFERING SUBSTANCE 90.6 92.8 93.5  PLT 165 156 144* 117* 140*    Cardiac Enzymes: No results for input(s): CKTOTAL, CKMB, CKMBINDEX, TROPONINI in the last 168 hours.  BNP: Invalid input(s): POCBNP  CBG: Recent Labs  Lab 04/15/24 2153 04/20/24 0620 04/20/24 0647 04/20/24 0710 04/20/24 1141  GLUCAP 100* 61* 59* 132* 87    Microbiology: Results for orders placed or performed during the hospital encounter of 04/15/24  Blood culture (routine x 2)     Status: None   Collection Time: 04/15/24  8:55 PM   Specimen: BLOOD  Result Value Ref Range Status   Specimen Description BLOOD BLOOD RIGHT ARM  Final   Special Requests   Final    BOTTLES DRAWN AEROBIC  AND ANAEROBIC Blood Culture adequate volume   Culture   Final    NO GROWTH 5 DAYS Performed at [facility], [address] [place], [place] [address]    Report Status 04/20/2024 FINAL  Final  Blood culture (routine x 2)     Status: None   Collection Time: 04/15/24  8:55 PM   Specimen: BLOOD  Result Value Ref Range Status   Specimen Description BLOOD BLOOD LEFT ARM  Final   Special Requests   Final    BOTTLES DRAWN AEROBIC AND ANAEROBIC  Blood Culture results may not be optimal due to an inadequate volume of blood received in culture bottles   Culture   Final    NO GROWTH 5 DAYS Performed at [facility], [address], [place], [place] [address]    Report Status 04/20/2024 FINAL  Final  MRSA Next Gen by PCR, Nasal     Status: None   Collection Time: 04/15/24 10:01 PM   Specimen: Nasal Mucosa; Nasal Swab  Result Value Ref Range Status   MRSA by PCR Next Gen NOT DETECTED NOT DETECTED Final    Comment: (NOTE) The GeneXpert MRSA Assay (FDA approved for NASAL specimens only), is one component of a comprehensive MRSA colonization surveillance program. It is not intended to diagnose MRSA infection nor to guide or monitor treatment for MRSA infections. Test performance is not FDA approved in patients less than 63 years old. Performed at [facility], [address] [place], [place] [address]   Culture, Respiratory w Gram Stain     Status: None   Collection Time: 04/16/24 11:42 AM   Specimen: SPU; Respiratory  Result Value Ref Range Status   Specimen Description   Final    SPUTUM Performed at [facility], [address], [place], [place] [address]    Special Requests   Final     EXPSU Performed at [facility], [address] [place], [place] [address]    Gram Stain   Final    ABUNDANT SQUAMOUS EPITHELIAL CELLS PRESENT MODERATE GRAM POSITIVE COCCI MODERATE GRAM NEGATIVE RODS    Culture   Final    Normal respiratory flora-no Staph aureus or Pseudomonas seen Performed at [facility], [address], [place], [place] [address]    Report Status 04/18/2024 FINAL  Final  Respiratory (~20 pathogens) panel by PCR     Status: None   Collection Time: 04/16/24 11:42 AM   Specimen: Expectorated Sputum; Respiratory  Result Value Ref Range Status   Adenovirus NOT DETECTED NOT DETECTED Final   Coronavirus 229E NOT DETECTED NOT DETECTED Final    Comment: (NOTE) The  Coronavirus on the Respiratory Panel, DOES NOT test for the novel  Coronavirus (2019 nCoV)    Coronavirus HKU1 NOT DETECTED NOT DETECTED Final   Coronavirus NL63 NOT DETECTED NOT DETECTED Final   Coronavirus OC43 NOT DETECTED NOT DETECTED Final   Metapneumovirus NOT DETECTED NOT DETECTED Final   Rhinovirus / Enterovirus NOT DETECTED NOT DETECTED Final   Influenza A NOT DETECTED NOT DETECTED Final   Influenza B NOT DETECTED NOT DETECTED Final   Parainfluenza Virus 1 NOT DETECTED NOT DETECTED Final   Parainfluenza Virus 2 NOT DETECTED NOT DETECTED Final   Parainfluenza Virus 3 NOT DETECTED NOT DETECTED Final   Parainfluenza Virus 4 NOT DETECTED NOT DETECTED Final   Respiratory Syncytial Virus NOT DETECTED NOT DETECTED Final   Bordetella pertussis NOT DETECTED NOT DETECTED Final   Bordetella  Parapertussis NOT DETECTED NOT DETECTED Final   Chlamydophila pneumoniae NOT DETECTED NOT DETECTED Final   Mycoplasma pneumoniae NOT DETECTED NOT DETECTED Final    Comment: Performed at [facility], [address], [place], [place] [address]    Coagulation Studies: Recent Labs    04/18/24 0324  LABPROT 16.2*  INR 1.2    Urinalysis: No results for input(s): COLORURINE, LABSPEC, PHURINE, GLUCOSEU, HGBUR, BILIRUBINUR, KETONESUR, PROTEINUR, UROBILINOGEN, NITRITE, LEUKOCYTESUR in the last 72 hours.  Invalid input(s): APPERANCEUR    Imaging: PERIPHERAL VASCULAR CATHETERIZATION Result Date: 04/19/2024 See surgical note for result.    Medications:    clevidipine  2 mg/hr (04/20/24 1400)   dextrose  5 % and 0.45 % NaCl 50 mL/hr at 04/20/24 1400   esmolol  75 mcg/kg/min (04/20/24 1400)    amLODipine   10 mg Oral Daily   aspirin  EC  81 mg Oral Daily   Chlorhexidine  Gluconate Cloth  6 each Topical Daily   clopidogrel   75 mg Oral Daily   hydrALAZINE   50 mg Oral Q8H   metoprolol  tartrate  25 mg Oral BID   docusate sodium , fentaNYL  (SUBLIMAZE ) injection,  hydrALAZINE , ondansetron  (ZOFRAN ) IV, mouth rinse, polyethylene glycol  Assessment/ Plan:  78 y.o. male with past medical history of peripheral arterial disease, hypertension, abdominal aortic aneurysm, hyperlipidemia, bladder cancer, thrombocytopenia who was admitted with hypertensive crisis, abdominal aneurysm, type I endoleak of aortic graft.  1.  Acute kidney injury/chronic kidney disease stage IIIb baseline creatinine 2.2 with EGFR 30 on 01/16/2024.  Acute kidney injury likely multifactorial with contribution from hypoperfusion from bilateral renal artery stenosis, and exposure to contrast with CT angio chest/pelvis/abdomen.   07/25 0701 - 07/26 0700 In: 1382.1 [I.V.:882.1; IV Piggyback:500] Out: 1375 [Urine:1375] Lab Results  Component Value Date   CREATININE 2.64 (H) 04/20/2024   CREATININE 2.86 (H) 04/19/2024   CREATININE 3.20 (H) 04/18/2024   Update: Creatinine trending down and currently improving.  Urine output is satisfactory over the preceding 24 hours.   Continue supportive care.  2.  Hypertension.  Blood pressure better controlled. Currently on amlodipine , hydralazine , metoprolol .  3.  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 Marea. Right renal artery stent placed.  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    LOS: 5 Nathan Rivera 7/26/20252:31 PM

## 2024-04-20 NOTE — Progress Notes (Signed)
 Patients glucose 61 this morning, recheck CBG was also 61; 1/2 amp D50 given per hypoglycemia protocol. NP aware and also entered orders for D51/2NS at 50 ml/hr and CBGs q 4 hours.

## 2024-04-20 NOTE — Consult Note (Signed)
 PHARMACY CONSULT NOTE - ELECTROLYTES  Pharmacy Consult for Electrolyte Monitoring and Replacement   Recent Labs: Height: 5' 9 (175.3 cm) Weight: 99.5 kg (219 lb 5.7 oz) IBW/kg (Calculated) : 70.7 Estimated Creatinine Clearance: 26.8 mL/min (A) (by C-G formula based on SCr of 2.64 mg/dL (H)). Potassium (mmol/L)  Date Value  04/20/2024 4.4  10/23/2014 4.1   Magnesium (mg/dL)  Date Value  92/73/7974 2.3   Calcium (mg/dL)  Date Value  92/73/7974 8.5 (L)   Calcium, Total (mg/dL)  Date Value  98/71/7983 9.0   Albumin  (g/dL)  Date Value  98/74/7975 4.1  08/18/2013 1.4 (L)   Phosphorus (mg/dL)  Date Value  92/73/7974 4.7 (H)   Sodium (mmol/L)  Date Value  04/20/2024 140  10/23/2014 144    Assessment  Nathan Rivera is a 78 y.o. male presenting with abdominal pain and hemoptysis. PMH significant for Infrarenal abdominal aortic aneurysm s/p endovascular repair of a ruptured AAA in 2011 on Xarelto , PAD, HTN, duodenal ulcer with perforation s/p omental patch repair, CKD stage III, thrombocytopenia, HDL, prior tobacco use, ESBL bacteremia, bladder cancer, chronic back pain, and bilateral inguinal hernia s/p repair . Pharmacy has been consulted to monitor and replace electrolytes.  MIVF: dextrose  5 % and 0.45 % NaCl at 50 mL/hr Pertinent medications: clevidipine  gtt, esmolol  gtt  Goal of Therapy: Electrolytes WNL  Plan:  No replacement currently indicated Check BMP, Mg, Phos with AM labs  Thank you for allowing pharmacy to be a part of this patient's care.  Nathan Rivera 04/20/2024 7:01 AM

## 2024-04-20 NOTE — Progress Notes (Signed)
 NAME:  Nathan Rivera, MRN:  [id number], DOB:  [birth year], LOS: 5 ADMISSION DATE:  04/15/2024, CHIEF COMPLAINT:  Hemoptysis   History of Present Illness:   78 y.o male with significant PMH of Infrarenal abdominal aortic aneurysm s/p endovascular repair of a ruptured AAA in 2011 on Xarelto , PAD, HTN, duodenal ulcer with perforation s/p omental patch repair, CKD stage III, thrombocytopenia, HDL, prior tobacco use, ESBL bacteremia, bladder cancer, chronic back pain, and bilateral inguinal hernia s/p repair who presented to the ED with chief complaints of abdominal pain and hemoptysis.   Patient and family at bedside reports that he took his last dose of Xarelto  yesterday evening.  He subsequently developed some abdominal pain around the epigastric and left upper quadrant region.  Since onset of symptoms has been coughing up bright red blood which he describes  blood filling up his mouth.  Denies other associated symptoms   ED Course: Initial vital signs showed HR of 68 beats/minute, BP 192/102 mm Hg, the RR 18 breaths/minute, and the oxygen saturation 93% on RA and a temperature of 98.55F (36.7C). Pertinent Labs/Diagnostics Findings: Na+/ K+:140/4.2 Glucose:94  BUN/Cr.:39/2.43 TAR:Lwmzfjmxjaoz  PCT: negative <0.10  BNP: 394.5  CTA Chest> CT Abd/pelvis> Saccular aneurysm at the level of the proximal stent with some soft tissue thickening and stranding concerning possible endoleak or impending rupture,Large aneurysm involving the left common iliac artery, partially encompasses the left iliac stent, Aneurysmal dilatation of the right common iliac artery at the distal end of the stent  Multiple small aneurysms of the proximal internal and iliac vessels.    CT findings discussed with on-call vascular Dr. Marea who recommended hemodynamic monitoring as it is unlikely patient will require emergent intervention but may require repair during the course of admission. PCCM consulted for admission, BP management  and close monitoring.  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 Aorto bifemoral endograft placement for AAA,  Chronic right-sided low back pain without sciatica;  Thrombocytopenia  Raynaud's disease with gangrene  B12 deficiency;  Personal history of malignant neoplasm of bladder;  Tobacco use disorder;  Hematuria, gross  ESBL Bacteremia due to septic shock due to obstructive stone with Pyelonephritis bilateral Inguinal Hernia s/p repair (02/24)  Significant Hospital Events: Including procedures, antibiotic start and stop dates in addition to other pertinent events   7/21: admit to ICU, on clevidipine  and esmolol  7/22: no recurrence of hemoptysis, but he continues to have a cough. EGD normal without any PUD or sign of GI bleed 7/23: worsening renal function, holding on surgical intervention 7/24: kidney function stable, ileus, N/V in the AM. Sat in chair 7/25: slept well overnight, kidney function improved. No complaints. Underwent renal artery stenting. 7/26: passing flatus, no abdominal pain  Interim History / Subjective:  Resting in bed, no discomfort, wife updated at bedside.  Objective    Blood pressure 122/66, pulse (!) 56, temperature 97.8 F (36.6 C), temperature source Oral, resp. rate 14, height 5' 9 (1.753 m), weight 99.5 kg, SpO2 95%.        Intake/Output Summary (Last 24 hours) at 04/20/2024 1335 Last data filed at 04/20/2024 1300 Gross per 24 hour  Intake 1829.56 ml  Output 1425 ml  Net 404.56 ml   Filed Weights   04/19/24 0445 04/19/24 1232 04/20/24 0429  Weight: 99.9 kg 99.9 kg 99.5  kg    Examination: Physical Exam Constitutional:      Appearance: He is not ill-appearing.  Cardiovascular:     Rate and Rhythm: Normal rate and regular rhythm.      Pulses: Normal pulses.     Heart sounds: Normal heart sounds.  Pulmonary:     Effort: Pulmonary effort is normal.     Breath sounds: Rhonchi present.  Neurological:     General: No focal deficit present.     Mental Status: He is alert. Mental status is at baseline.     Assessment and Plan   #Hemoptysis #Endoleak with Aneurysm Expansion #History of Infrarenal AAA s/p endovascular repeair #HTN emergency #AKI on CKD #Community Acquired Pneumonia #Mild Cognitive Impairment  78 year old male presenting with an episode of hemoptysis (golf ball sized) with concern for hematemesis given history of GI bleed in the past. Patient was found to have a possible endoleak with threat of rupture and is admitted to the ICU for tight blood pressure control.  Neuro - history of mild cognitive impairment, holding memantine and donepezil  given bradycardia. Delirium is improved.  CV - history of hypertension, and presented with elevated blood pressure. Started on strict HR and BP control given concern for endovascular leak and risk of AAA rupture. He does have significant vascular disease (bilateral severe renal artery stenosis, severe celiac artery stenosis, infrarenal abdominal aortoiliac stent graft, aneurysmal dilation of the abdominal aorta at the level of the renal arteries with a saccular component concerning for leakage vs impending rupture, left common iliac artery aneurysm). Given renal disease and need for contrast, endoleak repair is delayed, and he underwent renal artery stenosis stenting yesterday. Continue to allow BP goal of 130-140 to allow for renal perfusion.  -goal SBP of 130-140  -goal HR < 60  Pulm - No recurrence of his hemoptysis since his admission. I have personally reviewed his chest CT and note multiple tree in bud nodules as well as minimal ground glass opacities that were scattered, concerning for viral infection versus bacterial pneumonia/bronchitis. He remains on nasal  cannula and oxygenation is improved with holding of fluids, mobilization, and incentive spirometry.  GI - Underwent EGD that did not show any PUD or sign of GI bleed. Discontinued high dose PPI. CT abdomen with signs of ileus. Tried NG tube but patient unable to tolerate. With improved bowel sounds and passing of flatus, will trial clear liquids today. OOB to chair.  Renal - AKI on CKD, with bilateral renal artery stenosis. This was likely exacerbated by contrast exposure. Will hold IV fluids for now given increase in oxygen requirements. We have to balance his blood pressure requirements (need to lower to decrease risk of aneurysmal rupture vs need for BP to perfuse the kidneys). BP goal liberalized to 130 systolic. Kidney function continues to improve, will continue to monitor.  Endo - ICU glycemic protocol  Hem/Onc - on home rivaroxaban , holding given hemoptysis. H/H stable  ID - Viral panel negative, on Ceftriaxone  and Azithromycin  for CAP coverage. Sputum culture no growth.   Best Practice (right click and Reselect all SmartList Selections daily)   Diet/type: clear liquids DVT prophylaxis not indicated Pressure ulcer(s): N/A GI prophylaxis: N/A Lines: N/A Foley:  N/A Code Status:  full code Last date of multidisciplinary goals of care discussion [04/20/2024]  Labs   CBC: Recent Labs  Lab 04/16/24 0339 04/17/24 0836 04/18/24 0324 04/19/24 0232 04/20/24 0520  WBC 10.2 11.2* 11.0* 9.1 8.8  NEUTROABS  --  8.9*  8.7* 7.3  --   HGB 14.5 16.6 14.3 12.7* 13.4  HCT 44.0 RESULTS UNAVAILABLE DUE TO INTERFERING SUBSTANCE 42.5 38.9* 41.8  MCV 91.3 RESULTS UNAVAILABLE DUE TO INTERFERING SUBSTANCE 90.6 92.8 93.5  PLT 165 156 144* 117* 140*    Basic Metabolic Panel: Recent Labs  Lab 04/16/24 0339 04/17/24 0357 04/18/24 0324 04/19/24 0232 04/20/24 0520  NA 142 140 139 139 140  K 4.2 4.4 4.1 4.5 4.4  CL 108 107 107 111 111  CO2 24 20* 21* 19* 17*  GLUCOSE 96 102* 88 76 61*   BUN 43* 50* 50* 54* 50*  CREATININE 2.44* 3.08* 3.20* 2.86* 2.64*  CALCIUM 8.8* 8.7* 8.4* 7.9* 8.5*  MG 2.3 2.3 2.1 2.3 2.3  PHOS 3.9 4.3 5.1* 5.4* 4.7*   GFR: Estimated Creatinine Clearance: 26.8 mL/min (A) (by C-G formula based on SCr of 2.64 mg/dL (H)). Recent Labs  Lab 04/15/24 2030 04/15/24 2218 04/16/24 0339 04/17/24 0836 04/18/24 0324 04/19/24 0232 04/20/24 0520  PROCALCITON <0.10  --  <0.10  --   --   --   --   WBC  --   --  10.2 11.2* 11.0* 9.1 8.8  LATICACIDVEN 0.9 1.2  --   --   --   --   --     Liver Function Tests: No results for input(s): AST, ALT, ALKPHOS, BILITOT, PROT, ALBUMIN  in the last 168 hours. No results for input(s): LIPASE, AMYLASE in the last 168 hours. No results for input(s): AMMONIA in the last 168 hours.  ABG    Component Value Date/Time   PHART 7.273 (L) 08/24/2013 2337   PCO2ART 28.9 (L) 08/24/2013 2337   PO2ART 84.0 08/24/2013 2337   HCO3 13.4 (L) 08/24/2013 2337   TCO2 14 08/24/2013 2337   ACIDBASEDEF 12.0 (H) 08/24/2013 2337   O2SAT 95.0 08/24/2013 2337     Coagulation Profile: Recent Labs  Lab 04/15/24 1806 04/18/24 0324  INR 1.3* 1.2    Cardiac Enzymes: No results for input(s): CKTOTAL, CKMB, CKMBINDEX, TROPONINI in the last 168 hours.  HbA1C: No results found for: HGBA1C  CBG: Recent Labs  Lab 04/15/24 2153 04/20/24 0620 04/20/24 0647 04/20/24 0710 04/20/24 1141  GLUCAP 100* 61* 59* 132* 87    Review of Systems:   N/A  Past Medical History:  He,  has a past medical history of Alzheimer dementia (HCC), Aortic aneurysm (HCC), Arthritis, Cancer (HCC) (2012), CKD (chronic kidney disease), stage III (HCC) (2014), Degenerative disc disease, Degenerative joint disease, GERD (gastroesophageal reflux disease), History of kidney stones, Hypercholesterolemia, Hypertension, Impaired glucose tolerance, Muscle fatigue, Peripheral vascular disease (HCC), Pneumonia, Raynaud's disease, Sepsis (HCC)  (07/2013), Stroke [facility]), Thromboembolism of left popilteal vein (2015), and UTI (urinary tract infection) (08/17/2013).   Surgical History:   Past Surgical History:  Procedure Laterality Date   ABDOMINAL AORTIC ANEURYSM REPAIR  2011   CATARACT EXTRACTION W/PHACO Left 02/15/2021   Procedure: CATARACT EXTRACTION PHACO AND INTRAOCULAR LENS PLACEMENT (IOC) LEFT 5.59 00:44.0;  Surgeon: Myrna Adine Anes, MD;  Location: [facility];  Service: Ophthalmology;  Laterality: Left;   CATARACT EXTRACTION W/PHACO Right 03/08/2021   Procedure: CATARACT EXTRACTION PHACO AND INTRAOCULAR LENS PLACEMENT (IOC) RIGHT 1.81 00:22.7;  Surgeon: Myrna Adine Anes, MD;  Location: [facility];  Service: Ophthalmology;  Laterality: Right;   ESOPHAGOGASTRODUODENOSCOPY N/A 04/16/2024   Procedure: EGD (ESOPHAGOGASTRODUODENOSCOPY);  Surgeon: Jinny Carmine, MD;  Location: [facility] ENDOSCOPY;  Service: Endoscopy;  Laterality: N/A;   HERNIA REPAIR  24mo old   INSERTION OF MESH N/A 10/24/2014   Procedure: INSERTION OF MESH;  Surgeon: Deward Null III, MD;  Location: [facility] OR;  Service: General;  Laterality: N/A;   LAPAROTOMY N/A 08/24/2013   Procedure: EXPLORATORY LAPAROTOMY;  Surgeon: Deward GORMAN Null DOUGLAS, MD;  Location: [facility] OR;  Service: General;  Laterality: N/A;  Gram patch closure   litrotripsy  09/24/2013   NEPHROSTOMY  Nov. 2014   nephrostomy removal  Dec. 2014   PERIPHERALLY INSERTED CENTRAL CATHETER INSERTION     TONSILLECTOMY     UMBILICAL HERNIA REPAIR     50 yr ago   VENTRAL HERNIA REPAIR N/A 10/24/2014   Procedure: LAPAROSCOPIC ASSISTED VENTRAL HERNIA REPAIR WITH MESH;  Surgeon: Deward Null III, MD;  Location: [facility] OR;  Service: General;  Laterality: N/A;   XI ROBOTIC ASSISTED INGUINAL HERNIA REPAIR WITH MESH Bilateral 11/17/2022   Procedure: XI ROBOTIC ASSISTED INGUINAL HERNIA REPAIR WITH MESH-Bilateral;  Surgeon: Desiderio Schanz, MD;  Location: [facility] ORS;  Service: General;  Laterality: Bilateral;     Social  History:   reports that he has been smoking cigarettes. He has a 40 pack-year smoking history. He has been exposed to tobacco smoke. He has never used smokeless tobacco. He reports current alcohol use of about 3.0 standard drinks of alcohol per week. He reports that he does not use drugs.   Family History:  His family history includes Atrial fibrillation in his sister; CVA in his father.   Allergies Allergies  Allergen Reactions   Duloxetine Dermatitis     Home Medications  Prior to Admission medications   Medication Sig Start Date End Date Taking? Authorizing Provider  acetaminophen  (TYLENOL ) 325 MG tablet Take 325 mg by mouth every 6 (six) hours as needed for mild pain.   Yes [provider]  amLODipine  (NORVASC ) 5 MG tablet TAKE 1 TABLET BY MOUTH  DAILY 12/24/19  Yes Brown, Fallon E, NP  cholecalciferol (VITAMIN D) 1000 UNITS tablet Take 1,000 Units by mouth daily.   Yes [provider]  Cyanocobalamin (VITAMIN B-12 ER PO) Take by mouth.   Yes [provider]  donepezil  (ARICEPT ) 10 MG tablet Take 10 mg by mouth at bedtime.   Yes [provider]  FOLIC ACID PO Take by mouth.   Yes [provider]  gabapentin  (NEURONTIN ) 100 MG capsule 200 mg 2 (two) times daily. 200 mg in am and 200 mg in PM 04/01/17  Yes [provider]  Lactobacillus ([employer] PROBIOTIC ACIDOPHILUS) 10 MG CAPS Take by mouth.   Yes [provider]  memantine (NAMENDA) 10 MG tablet TAKE 1 TABLET BY MOUTH TWICE DAILY FOR MEMORY LOSS 02/28/24  Yes [provider]  pantoprazole  (PROTONIX ) 40 MG tablet Take 40 mg by mouth at bedtime.  11/13/13  Yes [provider]  simvastatin  (ZOCOR ) 20 MG tablet Take 20 mg by mouth at bedtime.   Yes [provider]  vitamin B-12 (CYANOCOBALAMIN) 500 MCG tablet Take 500 mcg by mouth daily.   Yes [provider]  XARELTO  20 MG TABS tablet daily.  12/23/13  Yes [provider]   amoxicillin -clavulanate (AUGMENTIN ) 875-125 MG tablet :1 Tablet(s) By Mouth Every 12 Hours Patient not taking: Reported on 04/15/2024 02/20/24   [provider]  pregabalin (LYRICA) 100 MG capsule Take 100 mg by mouth 2 (two) times daily. Patient not taking: Reported on 04/15/2024 12/20/23   [provider]     The patient is critically ill due to  endoleak, renal artery stenosis.  Critical care was necessary to treat or prevent imminent or life-threatening deterioration. Critical care time was spent by me on the following activities: development of a treatment plan with the patient and/or surrogate as well as nursing, discussions with consultants, evaluation of the patient's response to treatment, examination of the patient, obtaining a history from the patient or surrogate, ordering and performing treatments and interventions, ordering and review of laboratory studies, ordering and review of radiographic studies, review of telemetry data including pulse oximetry, re-evaluation of patient's condition and participation in multidisciplinary rounds.   I personally spent 31 minutes providing critical care not including any separately billable procedures.   Belva November, MD [facility] 04/20/2024 1:39 PM

## 2024-04-20 NOTE — Progress Notes (Signed)
 Patients blood pressure trend and Esmelol drip rate trend discussed with NP Ouma.   NP advised to restart Cleveprex drip,   NP Ouma advised if Blood pressure within goal rate of SBP 130-140 to titrate down Esmelol drip first.   Then if SBP still within goal to titrate down Cleveprex drip.

## 2024-04-20 NOTE — Progress Notes (Signed)
 Patients glucose improved after D50 given. Recheck CBG was 132. D5 1/2 NS infusing at 50 ml/hr and CBGs ordered q 4hours. Continuing to monitor closely.

## 2024-04-20 NOTE — Progress Notes (Addendum)
      Daily Progress Note   Assessment/Planning:   POD #1 s/p R RAS for STO R RA, aortic aneurysmal degeneration  Cr improving.  Reasonable amt of urine with clear character Further interventions per primary vascular surgical team next week assuming improvement in renal function Nothing planned over the weekend   Subjective  - 1 Day Post-Op   Feeling better   Objective   Vitals:   04/20/24 0700 04/20/24 0730 04/20/24 0800 04/20/24 0830  BP: (!) 141/68 (!) 143/79 139/79 (!) 146/78  Pulse: 64 63 61 60  Resp: 11 12 11 14   Temp:    97.8 F (36.6 C)  TempSrc:    Oral  SpO2: 98% 99% 99% 97%  Weight:      Height:         Intake/Output Summary (Last 24 hours) at 04/20/2024 0857 Last data filed at 04/20/2024 0800 Gross per 24 hour  Intake 1453.89 ml  Output 1175 ml  Net 278.89 ml    VASC R groin: no hematoma, mild ooze from access site    Laboratory   CBC    Latest Ref Rng & Units 04/20/2024    5:20 AM 04/19/2024    2:32 AM 04/18/2024    3:24 AM  CBC  WBC 4.0 - 10.5 K/uL 8.8  9.1  11.0   Hemoglobin 13.0 - 17.0 g/dL 86.5  87.2  85.6   Hematocrit 39.0 - 52.0 % 41.8  38.9  42.5   Platelets 150 - 400 K/uL 140  117  144     BMET    Component Value Date/Time   NA 140 04/20/2024 0520   NA 144 10/23/2014 1630   K 4.4 04/20/2024 0520   K 4.1 10/23/2014 1630   CL 111 04/20/2024 0520   CL 112 (H) 10/23/2014 1630   CO2 17 (L) 04/20/2024 0520   CO2 26 10/23/2014 1630   GLUCOSE 61 (L) 04/20/2024 0520   GLUCOSE 83 10/23/2014 1630   BUN 50 (H) 04/20/2024 0520   BUN 30 (H) 10/23/2014 1630   CREATININE 2.64 (H) 04/20/2024 0520   CREATININE 1.96 (H) 10/23/2014 1630   CALCIUM 8.5 (L) 04/20/2024 0520   CALCIUM 9.0 10/23/2014 1630   GFRNONAA 24 (L) 04/20/2024 0520   GFRNONAA 36 (L) 10/23/2014 1630   GFRNONAA 8 (L) 08/18/2013 0504   GFRAA 39 (L) 10/25/2014 0352   GFRAA 44 (L) 10/23/2014 1630   GFRAA 9 (L) 08/18/2013 0504     Redell Door, MD, FACS, FSVS Covering for  [facility]: [phone]  04/20/2024, 8:57 AM

## 2024-04-21 DIAGNOSIS — T82310A Breakdown (mechanical) of aortic (bifurcation) graft (replacement), initial encounter: Secondary | ICD-10-CM | POA: Diagnosis not present

## 2024-04-21 DIAGNOSIS — I161 Hypertensive emergency: Secondary | ICD-10-CM | POA: Diagnosis not present

## 2024-04-21 DIAGNOSIS — R042 Hemoptysis: Secondary | ICD-10-CM | POA: Diagnosis not present

## 2024-04-21 DIAGNOSIS — N179 Acute kidney failure, unspecified: Secondary | ICD-10-CM | POA: Diagnosis not present

## 2024-04-21 LAB — BASIC METABOLIC PANEL WITH GFR
Anion gap: 11 (ref 5–15)
BUN: 46 mg/dL — ABNORMAL HIGH (ref 8–23)
CO2: 16 mmol/L — ABNORMAL LOW (ref 22–32)
Calcium: 8.5 mg/dL — ABNORMAL LOW (ref 8.9–10.3)
Chloride: 110 mmol/L (ref 98–111)
Creatinine, Ser: 2.54 mg/dL — ABNORMAL HIGH (ref 0.61–1.24)
GFR, Estimated: 25 mL/min — ABNORMAL LOW (ref >60)
Glucose, Bld: 91 mg/dL (ref 70–99)
Potassium: 3.9 mmol/L (ref 3.5–5.1)
Sodium: 137 mmol/L (ref 135–145)

## 2024-04-21 LAB — GLUCOSE, CAPILLARY
Glucose-Capillary: 97 mg/dL (ref 70–99)
Glucose-Capillary: 86 mg/dL (ref 70–99)
Glucose-Capillary: 101 mg/dL — ABNORMAL HIGH (ref 70–99)
Glucose-Capillary: 120 mg/dL — ABNORMAL HIGH (ref 70–99)
Glucose-Capillary: 115 mg/dL — ABNORMAL HIGH (ref 70–99)

## 2024-04-21 LAB — CBC
HCT: 37.8 % — ABNORMAL LOW (ref 39.0–52.0)
Hemoglobin: 12.6 g/dL — ABNORMAL LOW (ref 13.0–17.0)
MCH: 30.8 pg (ref 26.0–34.0)
MCHC: 33.3 g/dL (ref 30.0–36.0)
MCV: 92.4 fL (ref 80.0–100.0)
Platelets: 132 K/uL — ABNORMAL LOW (ref 150–400)
RBC: 4.09 MIL/uL — ABNORMAL LOW (ref 4.22–5.81)
RDW: 15.5 % (ref 11.5–15.5)
WBC: 8.4 K/uL (ref 4.0–10.5)
nRBC: 0 % (ref 0.0–0.2)

## 2024-04-21 MED ORDER — DEXTROSE-SODIUM CHLORIDE 5-0.45 % IV SOLN: INTRAVENOUS | Status: DC

## 2024-04-21 MED ORDER — DEXTROSE 10 % IV SOLN: INTRAVENOUS | Status: DC

## 2024-04-21 NOTE — Evaluation (Signed)
 Physical Therapy Evaluation Patient Details Name: Nathan Rivera MRN: [id number] DOB: 01-01-1946 Today's Date: 04/21/2024  History of Present Illness  Pt is a 78 y.o male with PMH that includes: Infrarenal abdominal aortic aneurysm s/p endovascular repair of a ruptured AAA in 2011 on Xarelto , PAD, HTN, duodenal ulcer with perforation s/p omental patch repair, CKD stage III, thrombocytopenia, HDL, prior tobacco use, ESBL bacteremia, bladder cancer, chronic back pain, and bilateral inguinal hernia s/p repair who presented to the ED with chief complaints of abdominal pain and hemoptysis.  MD assessment includes: hemoptysis, endoleak with aneurysm expansion, HTN emergency, AKI on CKD, CAP, and mild cognitive impairment.   Clinical Impression  Pt was pleasantly confused but motivated to participate during the session and put forth good effort throughout. Pt required cuing for proper sequencing and min A to come to standing but once in standing was able to march in place x 10 reps and then amb 40 feet with no overt LOB.  Pt reported no adverse symptoms during the session with SpO2 dripping to a low of 87% and HR WNL on room air, nursing notified.  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  Pt will benefit from continued PT services upon discharge to safely address deficits listed in patient problem list for decreased caregiver assistance and eventual return to PLOF.              If plan is discharge home, recommend the following: A lot of help with walking and/or transfers;A little help with bathing/dressing/bathroom;Assistance with cooking/housework;Help with stairs or ramp for entrance;Assist for transportation;Supervision due to cognitive status;Direct supervision/assist for financial management   Can travel by private vehicle   Yes    Equipment Recommendations None recommended by PT   Recommendations for Other Services       Functional Status Assessment Patient has had a recent decline in their functional status and demonstrates the ability to make significant improvements in function in a reasonable and predictable amount of time.     Precautions / Restrictions Precautions Precautions: Fall Restrictions Weight Bearing Restrictions Per Provider Order: No      Mobility  Bed Mobility               General bed mobility comments: NT, pt in recliner pre-post session    Transfers Overall transfer level: Needs assistance Equipment used: Rolling walker (2 wheels) Transfers: Sit to/from Stand Sit to Stand: Min assist           General transfer comment: Mod multi-modal cues for sequencing with fair eccentric and concentric control    Ambulation/Gait Ambulation/Gait assistance: Contact guard assist, +2 safety/equipment Gait Distance (Feet): 40 Feet Assistive device: Rolling walker (2 wheels) Gait Pattern/deviations: Step-through pattern, Decreased step length - right, Decreased step length - left, Trunk flexed Gait velocity: decreased     General Gait Details: Slow cadence with short B step length but generally steady with no overt LOB  Stairs            Wheelchair Mobility     Tilt Bed    Modified Rankin (Stroke Patients Only)       Balance Overall balance assessment: Needs assistance, History of Falls Sitting-balance support: Bilateral upper extremity supported, Feet supported Sitting balance-Leahy Scale: Good     Standing balance support: Bilateral upper extremity supported, During functional activity, Reliant on assistive device for balance Standing balance-Leahy Scale: Fair  Pertinent Vitals/Pain Pain Assessment Pain Assessment: No/denies pain    Home Living Family/patient expects to be discharged to:: Private residence Living Arrangements: Spouse/significant other Available  Help at Discharge: Family;Available 24 hours/day Type of Home: House Home Access: Stairs to enter Entrance Stairs-Rails: Right Entrance Stairs-Number of Steps: 3   Home Layout: Two level;Able to live on main level with bedroom/bathroom Home Equipment: Rolling Walker (2 wheels);Rollator (4 wheels);Cane - single point;Shower seat - built in;BSC/3in1 Additional Comments: History and PLOF provided by spouse secondary to pt cognition    Prior Function Prior Level of Function : Independent/Modified Independent;History of Falls (last six months)             Mobility Comments: Mod Ind amb community distances with a SPC, 2 falls in the last year secondary to tripping ADLs Comments: Ind with ADLs     Extremity/Trunk Assessment   Upper Extremity Assessment Upper Extremity Assessment: Overall WFL for tasks assessed    Lower Extremity Assessment Lower Extremity Assessment: Generalized weakness       Communication   Communication Communication: No apparent difficulties    Cognition Arousal: Alert Behavior During Therapy: WFL for tasks assessed/performed   PT - Cognitive impairments: Awareness, Memory, Orientation, Safety/Judgement   Orientation impairments: Person                     Following commands: Impaired Following commands impaired: Follows one step commands with increased time     Cueing Cueing Techniques: Verbal cues, Tactile cues, Visual cues     General Comments      Exercises     Assessment/Plan    PT Assessment Patient needs continued PT services  PT Problem List Decreased strength;Decreased activity tolerance;Decreased balance;Decreased mobility;Decreased knowledge of use of DME       PT Treatment Interventions DME instruction;Gait training;Stair training;Functional mobility training;Therapeutic activities;Therapeutic exercise;Balance training;Patient/family education    PT Goals (Current goals can be found in the Care Plan section)  Acute  Rehab PT Goals Patient Stated Goal: To get stronger PT Goal Formulation: With patient/family Time For Goal Achievement: 05/03/24 Potential to Achieve Goals: Good    Frequency Min 3X/week     Co-evaluation               AM-PAC PT 6 Clicks Mobility  Outcome Measure Help needed turning from your back to your side while in a flat bed without using bedrails?: A Little Help needed moving from lying on your back to sitting on the side of a flat bed without using bedrails?: A Little Help needed moving to and from a bed to a chair (including a wheelchair)?: A Little Help needed standing up from a chair using your arms (e.g., wheelchair or bedside chair)?: A Little Help needed to walk in hospital room?: A Little Help needed climbing 3-5 steps with a railing? : A Lot 6 Click Score: 17    End of Session Equipment Utilized During Treatment: Gait belt Activity Tolerance: Patient tolerated treatment well Patient left: in chair;with call bell/phone within reach;with family/visitor present Nurse Communication: Mobility status;Other (comment) (SpO2 down to a low of 87% with ambulation on room air) PT Visit Diagnosis: History of falling (Z91.81);Difficulty in walking, not elsewhere classified (R26.2);Muscle weakness (generalized) (M62.81)    Time: 8493-8465 PT Time Calculation (min) (ACUTE ONLY): 28 min   Charges:   PT Evaluation $PT Eval Moderate Complexity: 1 Mod PT Treatments $Gait Training: 8-22 mins PT General Charges $$ ACUTE PT VISIT: 1 Visit  CHARM Glendia Bertin PT, DPT 04/21/24, 4:06 PM

## 2024-04-21 NOTE — Plan of Care (Addendum)
 Patient continues to be confused, oriented to self only. Denies pain. Cleviprex  and Esmolol  being titrated to keep SBP 130-140's and HR less than 60. Esmolol  infusing at 25 mcg/kg/min, Cleviprex  infusing at 6 mg/hr and D5 1/2 NS infusing at 50 ml/hr. Glucose levels have stabilized, will continue to check CBG's q 4 hours. Patient continues to void well via external catheter of clear yellow urine. Last BM 07/21/205 Problem: Education: Goal: Knowledge of General Education information will improve Description: Including pain rating scale, medication(s)/side effects and non-pharmacologic comfort measures Outcome: Progressing   Problem: Clinical Measurements: Goal: Ability to maintain clinical measurements within normal limits will improve Outcome: Progressing Goal: Will remain free from infection Outcome: Progressing Goal: Diagnostic test results will improve Outcome: Progressing Goal: Respiratory complications will improve Outcome: Progressing Goal: Cardiovascular complication will be avoided Outcome: Progressing   Problem: Coping: Goal: Level of anxiety will decrease Outcome: Progressing   Problem: Elimination: Goal: Will not experience complications related to urinary retention Outcome: Progressing   Problem: Pain Managment: Goal: General experience of comfort will improve and/or be controlled Outcome: Progressing   Problem: Nutrition: Goal: Adequate nutrition will be maintained Outcome: Not Progressing   Problem: Elimination: Goal: Will not experience complications related to bowel motility Outcome: Not Progressing

## 2024-04-21 NOTE — Consult Note (Signed)
 PHARMACY CONSULT NOTE - ELECTROLYTES  Pharmacy Consult for Electrolyte Monitoring and Replacement   Recent Labs: Height: 5' 9 (175.3 cm) Weight: 99.4 kg (219 lb 2.2 oz) IBW/kg (Calculated) : 70.7 Estimated Creatinine Clearance: 27.9 mL/min (A) (by C-G formula based on SCr of 2.54 mg/dL (H)). Potassium (mmol/L)  Date Value  04/21/2024 3.9  10/23/2014 4.1   Magnesium (mg/dL)  Date Value  92/73/7974 2.3   Calcium (mg/dL)  Date Value  92/72/7974 8.5 (L)   Calcium, Total (mg/dL)  Date Value  98/71/7983 9.0   Albumin  (g/dL)  Date Value  98/74/7975 4.1  08/18/2013 1.4 (L)   Phosphorus (mg/dL)  Date Value  92/73/7974 4.7 (H)   Sodium (mmol/L)  Date Value  04/21/2024 137  10/23/2014 144    Assessment  Nathan Rivera is a 78 y.o. male presenting with abdominal pain and hemoptysis. PMH significant for Infrarenal abdominal aortic aneurysm s/p endovascular repair of a ruptured AAA in 2011 on Xarelto , PAD, HTN, duodenal ulcer with perforation s/p omental patch repair, CKD stage III, thrombocytopenia, HDL, prior tobacco use, ESBL bacteremia, bladder cancer, chronic back pain, and bilateral inguinal hernia s/p repair . Pharmacy has been consulted to monitor and replace electrolytes.  MIVF: dextrose  10 % at 20 mL/hr Pertinent medications: clevidipine  gtt, esmolol  gtt  Goal of Therapy: Electrolytes WNL  Plan:  No replacement currently indicated Check BMP, Mg, Phos with AM labs  Thank you for allowing pharmacy to be a part of this patient's care.  Adriana JONETTA Bolster 04/21/2024 7:03 AM

## 2024-04-21 NOTE — Progress Notes (Signed)
      Daily Progress Note   Assessment/Planning:   POD #2 s/p R RAS  Stabilizing renal function.  Adequate UOP Still requiring IV medication to control BP, suggesting pt does NOT have RAS as his etiology of his HTN Primary vascular team back tomorrow   Subjective  - 2 Days Post-Op   Some confusion overnight, no complaints this AM   Objective   Vitals:   04/21/24 0700 04/21/24 0715 04/21/24 0730 04/21/24 0800  BP: 131/69  (!) 138/95 138/72  Pulse: (!) 59 (!) 59 60 62  Resp: 18 15 16 17   Temp:  98 F (36.7 C)    TempSrc:  Oral    SpO2: 98% 95% 96% 92%  Weight:      Height:         Intake/Output Summary (Last 24 hours) at 04/21/2024 0814 Last data filed at 04/21/2024 0800 Gross per 24 hour  Intake 1979.54 ml  Output 1100 ml  Net 879.54 ml    VASC R groin: no active bleeding, ABD: soft, NTND, G/R    Laboratory   CBC    Latest Ref Rng & Units 04/21/2024    4:13 AM 04/20/2024    5:20 AM 04/19/2024    2:32 AM  CBC  WBC 4.0 - 10.5 K/uL 8.4  8.8  9.1   Hemoglobin 13.0 - 17.0 g/dL 87.3  86.5  87.2   Hematocrit 39.0 - 52.0 % 37.8  41.8  38.9   Platelets 150 - 400 K/uL 132  140  117     BMET    Component Value Date/Time   NA 137 04/21/2024 0413   NA 144 10/23/2014 1630   K 3.9 04/21/2024 0413   K 4.1 10/23/2014 1630   CL 110 04/21/2024 0413   CL 112 (H) 10/23/2014 1630   CO2 16 (L) 04/21/2024 0413   CO2 26 10/23/2014 1630   GLUCOSE 91 04/21/2024 0413   GLUCOSE 83 10/23/2014 1630   BUN 46 (H) 04/21/2024 0413   BUN 30 (H) 10/23/2014 1630   CREATININE 2.54 (H) 04/21/2024 0413   CREATININE 1.96 (H) 10/23/2014 1630   CALCIUM 8.5 (L) 04/21/2024 0413   CALCIUM 9.0 10/23/2014 1630   GFRNONAA 25 (L) 04/21/2024 0413   GFRNONAA 36 (L) 10/23/2014 1630   GFRNONAA 8 (L) 08/18/2013 0504   GFRAA 39 (L) 10/25/2014 0352   GFRAA 44 (L) 10/23/2014 1630   GFRAA 9 (L) 08/18/2013 0504     Redell Door, MD, FACS, FSVS Covering for [facility]:  [phone]  04/21/2024, 8:14 AM

## 2024-04-21 NOTE — Progress Notes (Signed)
 [facility]  ROUNDING NOTE   Subjective:   Patient with past medical history of peripheral arterial disease, hypertension, abdominal aortic aneurysm, hyperlipidemia, bladder cancer, thrombocytopenia who was admitted with hypertensive crisis, abdominal aneurysm, type I endoleak of aortic graft.  Update: Reports that he is tired today.  Lying in the bed, looks exhausted.  Continues to require oxygen supplementation.  Currently on clevidipine  and esmolol  drip.  Also getting IV sodium bicarbonate . Able to take in some clears. Urine output recorded 1100 cc. Serum creatinine improving slowly as noted below  07/26 0701 - 07/27 0700 In: 1888.7 [P.O.:50; I.V.:1738.7; IV Piggyback:100] Out: 1100 [Urine:1100] Lab Results  Component Value Date   CREATININE 2.54 (H) 04/21/2024   CREATININE 2.64 (H) 04/20/2024   CREATININE 2.86 (H) 04/19/2024     Objective:  Vital signs in last 24 hours:  Temp:  [97.8 F (36.6 C)-98.7 F (37.1 C)] 97.9 F (36.6 C) (07/27 1300) Pulse Rate:  [44-65] 46 (07/27 1330) Resp:  [10-22] 12 (07/27 1400) BP: (109-149)/(61-108) 127/67 (07/27 1400) SpO2:  [90 %-100 %] 95 % (07/27 1400) Weight:  [99.4 kg] 99.4 kg (07/27 0413)  Weight change: -0.5 kg Filed Weights   04/19/24 1232 04/20/24 0429 04/21/24 0413  Weight: 99.9 kg 99.5 kg 99.4 kg    Intake/Output: I/O last 3 completed shifts: In: 2581.5 [P.O.:50; I.V.:2131.5; IV Piggyback:400] Out: 1950 [Urine:1950]   Intake/Output this shift:  Total I/O In: 675.5 [P.O.:360; I.V.:315.5] Out: 400 [Urine:400]  Physical Exam: General: No acute distress  Head: Normocephalic, atraumatic. Moist oral mucosal membranes  Neck: Supple  Lungs:  Scattered rhonchi, normal effort  Heart: S1S2 no rubs  Abdomen:  Soft, nontender, bowel sounds present  Extremities: Trace peripheral edema.  Neurologic: Awake, alert, following commands  Skin: No acute rash  Access: No hemodialysis access  External urinary  collection system  Basic Metabolic Panel: Recent Labs  Lab 04/16/24 0339 04/17/24 0357 04/18/24 0324 04/19/24 0232 04/20/24 0520 04/21/24 0413  NA 142 140 139 139 140 137  K 4.2 4.4 4.1 4.5 4.4 3.9  CL 108 107 107 111 111 110  CO2 24 20* 21* 19* 17* 16*  GLUCOSE 96 102* 88 76 61* 91  BUN 43* 50* 50* 54* 50* 46*  CREATININE 2.44* 3.08* 3.20* 2.86* 2.64* 2.54*  CALCIUM 8.8* 8.7* 8.4* 7.9* 8.5* 8.5*  MG 2.3 2.3 2.1 2.3 2.3  --   PHOS 3.9 4.3 5.1* 5.4* 4.7*  --     Liver Function Tests: No results for input(s): AST, ALT, ALKPHOS, BILITOT, PROT, ALBUMIN  in the last 168 hours. No results for input(s): LIPASE, AMYLASE in the last 168 hours. No results for input(s): AMMONIA in the last 168 hours.  CBC: Recent Labs  Lab 04/17/24 0836 04/18/24 0324 04/19/24 0232 04/20/24 0520 04/21/24 0413  WBC 11.2* 11.0* 9.1 8.8 8.4  NEUTROABS 8.9* 8.7* 7.3  --   --   HGB 16.6 14.3 12.7* 13.4 12.6*  HCT RESULTS UNAVAILABLE DUE TO INTERFERING SUBSTANCE 42.5 38.9* 41.8 37.8*  MCV RESULTS UNAVAILABLE DUE TO INTERFERING SUBSTANCE 90.6 92.8 93.5 92.4  PLT 156 144* 117* 140* 132*    Cardiac Enzymes: No results for input(s): CKTOTAL, CKMB, CKMBINDEX, TROPONINI in the last 168 hours.  BNP: Invalid input(s): POCBNP  CBG: Recent Labs  Lab 04/20/24 1947 04/20/24 2354 04/21/24 0337 04/21/24 0733 04/21/24 1121  GLUCAP 114* 94 97 86 101*    Microbiology: Results for orders placed or performed during the hospital encounter of 04/15/24  Blood culture (  routine x 2)     Status: None   Collection Time: 04/15/24  8:55 PM   Specimen: BLOOD  Result Value Ref Range Status   Specimen Description BLOOD BLOOD RIGHT ARM  Final   Special Requests   Final    BOTTLES DRAWN AEROBIC AND ANAEROBIC Blood Culture adequate volume   Culture   Final    NO GROWTH 5 DAYS Performed at [facility], [address] [place], [place] [address]    Report Status 04/20/2024  FINAL  Final  Blood culture (routine x 2)     Status: None   Collection Time: 04/15/24  8:55 PM   Specimen: BLOOD  Result Value Ref Range Status   Specimen Description BLOOD BLOOD LEFT ARM  Final   Special Requests   Final    BOTTLES DRAWN AEROBIC AND ANAEROBIC Blood Culture results may not be optimal due to an inadequate volume of blood received in culture bottles   Culture   Final    NO GROWTH 5 DAYS Performed at [facility], [address] [place], [place] [address]    Report Status 04/20/2024 FINAL  Final  MRSA Next Gen by PCR, Nasal     Status: None   Collection Time: 04/15/24 10:01 PM   Specimen: Nasal Mucosa; Nasal Swab  Result Value Ref Range Status   MRSA by PCR Next Gen NOT DETECTED NOT DETECTED Final    Comment: (NOTE) The GeneXpert MRSA Assay (FDA approved for NASAL specimens only), is one component of a comprehensive MRSA colonization surveillance program. It is not intended to diagnose MRSA infection nor to guide or monitor treatment for MRSA infections. Test performance is not FDA approved in patients less than 32 years old. Performed at [facility], [address] [place], [place] [address]   Culture, Respiratory w Gram Stain     Status: None   Collection Time: 04/16/24 11:42 AM   Specimen: SPU; Respiratory  Result Value Ref Range Status   Specimen Description   Final    SPUTUM Performed at [facility], [address], [place], [place] [address]    Special Requests   Final     EXPSU Performed at [facility], [address] [place], [place] [address]    Gram Stain   Final    ABUNDANT SQUAMOUS EPITHELIAL CELLS PRESENT MODERATE GRAM POSITIVE COCCI MODERATE GRAM NEGATIVE RODS    Culture   Final    Normal respiratory flora-no Staph aureus or Pseudomonas seen Performed at [facility], [address], [place], [place] [address]    Report Status 04/18/2024 FINAL  Final  Respiratory (~20 pathogens) panel by  PCR     Status: None   Collection Time: 04/16/24 11:42 AM   Specimen: Expectorated Sputum; Respiratory  Result Value Ref Range Status   Adenovirus NOT DETECTED NOT DETECTED Final   Coronavirus 229E NOT DETECTED NOT DETECTED Final    Comment: (NOTE) The Coronavirus on the Respiratory Panel, DOES NOT test for the novel  Coronavirus (2019 nCoV)    Coronavirus HKU1 NOT DETECTED NOT DETECTED Final   Coronavirus NL63 NOT DETECTED NOT DETECTED Final   Coronavirus OC43 NOT DETECTED NOT DETECTED Final   Metapneumovirus NOT DETECTED NOT DETECTED Final   Rhinovirus / Enterovirus NOT DETECTED NOT DETECTED Final   Influenza A NOT DETECTED NOT DETECTED Final   Influenza B NOT DETECTED NOT DETECTED Final   Parainfluenza Virus 1 NOT DETECTED NOT DETECTED Final  Parainfluenza Virus 2 NOT DETECTED NOT DETECTED Final   Parainfluenza Virus 3 NOT DETECTED NOT DETECTED Final   Parainfluenza Virus 4 NOT DETECTED NOT DETECTED Final   Respiratory Syncytial Virus NOT DETECTED NOT DETECTED Final   Bordetella pertussis NOT DETECTED NOT DETECTED Final   Bordetella Parapertussis NOT DETECTED NOT DETECTED Final   Chlamydophila pneumoniae NOT DETECTED NOT DETECTED Final   Mycoplasma pneumoniae NOT DETECTED NOT DETECTED Final    Comment: Performed at [facility], [address], [place], [place] [address]    Coagulation Studies: No results for input(s): LABPROT, INR in the last 72 hours.   Urinalysis: No results for input(s): COLORURINE, LABSPEC, PHURINE, GLUCOSEU, HGBUR, BILIRUBINUR, KETONESUR, PROTEINUR, UROBILINOGEN, NITRITE, LEUKOCYTESUR in the last 72 hours.  Invalid input(s): APPERANCEUR    Imaging: No results found.    Medications:    clevidipine  2 mg/hr (04/21/24 1400)   dextrose  20 mL/hr at 04/21/24 1400   esmolol  Stopped (04/21/24 1037)    amLODipine   10 mg Oral Daily   aspirin  EC  81 mg Oral Daily   Chlorhexidine  Gluconate Cloth  6 each Topical Daily    clopidogrel   75 mg Oral Daily   hydrALAZINE   50 mg Oral Q8H   metoprolol  tartrate  25 mg Oral BID   docusate sodium , fentaNYL  (SUBLIMAZE ) injection, hydrALAZINE , ondansetron  (ZOFRAN ) IV, mouth rinse, polyethylene glycol  Assessment/ Plan:  78 y.o. male with past medical history of peripheral arterial disease, hypertension, abdominal aortic aneurysm, hyperlipidemia, bladder cancer, thrombocytopenia who was admitted with hypertensive crisis, abdominal aneurysm, endoleak of aortic graft.  1.  Acute kidney injury/chronic kidney disease stage IIIb -baseline creatinine 2.2 with EGFR 30 on 01/16/2024.  Acute kidney injury likely multifactorial with contribution from hypoperfusion from bilateral renal artery stenosis, and exposure to contrast with CT angio chest/pelvis/abdomen.   07/26 0701 - 07/27 0700 In: 1888.7 [P.O.:50; I.V.:1738.7; IV Piggyback:100] Out: 1100 [Urine:1100] Lab Results  Component Value Date   CREATININE 2.54 (H) 04/21/2024   CREATININE 2.64 (H) 04/20/2024   CREATININE 2.86 (H) 04/19/2024   Update: Creatinine trending down and currently improving.  Urine output is satisfactory over the preceding 24 hours.   Continue supportive care.  2.  Hypertension.  Blood pressure better controlled. Currently on amlodipine , hydralazine , metoprolol . Also requiring IV clevidipine  and esmolol  drip. can consider low-dose ARB   3.  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 Marea. Right renal artery stent placed.  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 Vascular surgery team is following along.    LOS: 6 Bianey Tesoro 7/27/20252:26 PM

## 2024-04-21 NOTE — Progress Notes (Signed)
 NAME:  Nathan Rivera, MRN:  [id number], DOB:  07/01/1946, LOS: 6 ADMISSION DATE:  04/15/2024, CHIEF COMPLAINT:  Hemoptysis   History of Present Illness:   78 y.o male with significant PMH of Infrarenal abdominal aortic aneurysm s/p endovascular repair of a ruptured AAA in 2011 on Xarelto , PAD, HTN, duodenal ulcer with perforation s/p omental patch repair, CKD stage III, thrombocytopenia, HDL, prior tobacco use, ESBL bacteremia, bladder cancer, chronic back pain, and bilateral inguinal hernia s/p repair who presented to the ED with chief complaints of abdominal pain and hemoptysis.   Patient and family at bedside reports that he took his last dose of Xarelto  yesterday evening.  He subsequently developed some abdominal pain around the epigastric and left upper quadrant region.  Since onset of symptoms has been coughing up bright red blood which he describes  blood filling up his mouth.  Denies other associated symptoms   ED Course: Initial vital signs showed HR of 68 beats/minute, BP 192/102 mm Hg, the RR 18 breaths/minute, and the oxygen saturation 93% on RA and a temperature of 98.45F (36.7C). Pertinent Labs/Diagnostics Findings: Na+/ K+:140/4.2 Glucose:94  BUN/Cr.:39/2.43 TAR:Lwmzfjmxjaoz  PCT: negative <0.10  BNP: 394.5  CTA Chest> CT Abd/pelvis> Saccular aneurysm at the level of the proximal stent with some soft tissue thickening and stranding concerning possible endoleak or impending rupture,Large aneurysm involving the left common iliac artery, partially encompasses the left iliac stent, Aneurysmal dilatation of the right common iliac artery at the distal end of the stent  Multiple small aneurysms of the proximal internal and iliac vessels.    CT findings discussed with on-call vascular Dr. Marea who recommended hemodynamic monitoring as it is unlikely patient will require emergent intervention but may require repair during the course of admission. PCCM consulted for admission, BP management  and close monitoring.  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 Aorto bifemoral endograft placement for AAA,  Chronic right-sided low back pain without sciatica;  Thrombocytopenia  Raynaud's disease with gangrene  B12 deficiency;  Personal history of malignant neoplasm of bladder;  Tobacco use disorder;  Hematuria, gross  ESBL Bacteremia due to septic shock due to obstructive stone with Pyelonephritis bilateral Inguinal Hernia s/p repair (02/24)  Significant Hospital Events: Including procedures, antibiotic start and stop dates in addition to other pertinent events   7/21: admit to ICU, on clevidipine  and esmolol  7/22: no recurrence of hemoptysis, but he continues to have a cough. EGD normal without any PUD or sign of GI bleed 7/23: worsening renal function, holding on surgical intervention 7/24: kidney function stable, ileus, N/V in the AM. Sat in chair 7/25: slept well overnight, kidney function improved. No complaints. Underwent renal artery stenting. 7/26: passing flatus, no abdominal pain 7/27: delirium, no pain reported but he is confused today  Interim History / Subjective:  Resting in bed, daughter updated at bedside.  Objective    Blood pressure 131/71, pulse (!) 48, temperature 98 F (36.7 C), temperature source Oral, resp. rate 15, height 5' 9 (1.753 m), weight 99.4 kg, SpO2 96%.        Intake/Output Summary (Last 24 hours) at 04/21/2024 1225 Last data filed at 04/21/2024 1123 Gross per 24 hour  Intake 1991.61 ml  Output 1500 ml  Net 491.61 ml   Filed Weights   04/19/24 1232 04/20/24 0429 04/21/24  0413  Weight: 99.9 kg 99.5 kg 99.4 kg    Examination: Physical Exam Constitutional:      Appearance: He is not ill-appearing.  Cardiovascular:      Rate and Rhythm: Normal rate and regular rhythm.     Pulses: Normal pulses.     Heart sounds: Normal heart sounds.  Pulmonary:     Effort: Pulmonary effort is normal.     Breath sounds: Rhonchi present.  Abdominal:     General: Bowel sounds are normal. There is distension.  Neurological:     General: No focal deficit present.     Mental Status: He is alert. Mental status is at baseline.     Assessment and Plan   #Hemoptysis #Endoleak with Aneurysm Expansion #History of Infrarenal AAA s/p endovascular repeair #HTN emergency #AKI on CKD #Community Acquired Pneumonia #Mild Cognitive Impairment  78 year old male presenting with an episode of hemoptysis (golf ball sized) with concern for hematemesis given history of GI bleed in the past. Patient was found to have a possible endoleak with threat of rupture and is admitted to the ICU for tight blood pressure control. He is s/p renal artery stenting.  Neuro - history of mild cognitive impairment, holding memantine and donepezil  given bradycardia. Continues to have delirium.  CV - history of hypertension, and presented with elevated blood pressure. Started on strict HR and BP control given concern for endovascular leak and risk of AAA rupture. He does have significant vascular disease (bilateral severe renal artery stenosis, severe celiac artery stenosis, infrarenal abdominal aortoiliac stent graft, aneurysmal dilation of the abdominal aorta at the level of the renal arteries with a saccular component concerning for leakage vs impending rupture, left common iliac artery aneurysm). Given renal disease and need for contrast, endoleak repair is delayed, and he underwent renal artery stenosis stenting. Continue with goal SBP of 130-140 mmHg and HR < 60.  -titrate clevidipine  for goal SBP of 130-140  -titrate esmolol  for goal HR < 60  Pulm - No recurrence of his hemoptysis since his admission. Chest CT with multiple tree in bud nodules as well as  minimal ground glass opacities that were scattered, concerning for viral infection versus bacterial pneumonia/bronchitis. He remains on nasal cannula and oxygenation is improved with holding of fluids, mobilization, and incentive spirometry.  GI - Underwent EGD that did not show any PUD or sign of GI bleed. Discontinued high dose PPI. CT abdomen with signs of ileus. Tried NG tube but did not tolerate. Bowel sounds are improved, and he has passed flatus. Trial clear liquid diet. Continue with OOB to chair.  Renal - AKI on CKD, with bilateral renal artery stenosis. This was likely exacerbated by contrast exposure. Holding IV fluids and liberalizing BP requirements to allow for kidney perfusion (see CV above). Kidney function mildly improved today.  Endo - ICU glycemic protocol  Hem/Onc - on home rivaroxaban , holding given hemoptysis. H/H stable  ID - Viral panel negative, on Ceftriaxone  and Azithromycin  for CAP coverage. Sputum culture no growth.   Best Practice (right click and Reselect all SmartList Selections daily)   Diet/type: clear liquids DVT prophylaxis not indicated Pressure ulcer(s): N/A GI prophylaxis: N/A Lines: N/A Foley:  N/A Code Status:  full code Last date of multidisciplinary goals of care discussion [04/21/2024]  Labs   CBC: Recent Labs  Lab 04/17/24 0836 04/18/24 0324 04/19/24 0232 04/20/24 0520 04/21/24 0413  WBC 11.2* 11.0* 9.1 8.8 8.4  NEUTROABS 8.9* 8.7* 7.3  --   --  HGB 16.6 14.3 12.7* 13.4 12.6*  HCT RESULTS UNAVAILABLE DUE TO INTERFERING SUBSTANCE 42.5 38.9* 41.8 37.8*  MCV RESULTS UNAVAILABLE DUE TO INTERFERING SUBSTANCE 90.6 92.8 93.5 92.4  PLT 156 144* 117* 140* 132*    Basic Metabolic Panel: Recent Labs  Lab 04/16/24 0339 04/17/24 0357 04/18/24 0324 04/19/24 0232 04/20/24 0520 04/21/24 0413  NA 142 140 139 139 140 137  K 4.2 4.4 4.1 4.5 4.4 3.9  CL 108 107 107 111 111 110  CO2 24 20* 21* 19* 17* 16*  GLUCOSE 96 102* 88 76 61* 91   BUN 43* 50* 50* 54* 50* 46*  CREATININE 2.44* 3.08* 3.20* 2.86* 2.64* 2.54*  CALCIUM 8.8* 8.7* 8.4* 7.9* 8.5* 8.5*  MG 2.3 2.3 2.1 2.3 2.3  --   PHOS 3.9 4.3 5.1* 5.4* 4.7*  --    GFR: Estimated Creatinine Clearance: 27.9 mL/min (A) (by C-G formula based on SCr of 2.54 mg/dL (H)). Recent Labs  Lab 04/15/24 2030 04/15/24 2218 04/16/24 0339 04/17/24 0836 04/18/24 0324 04/19/24 0232 04/20/24 0520 04/21/24 0413  PROCALCITON <0.10  --  <0.10  --   --   --   --   --   WBC  --   --  10.2   < > 11.0* 9.1 8.8 8.4  LATICACIDVEN 0.9 1.2  --   --   --   --   --   --    < > = values in this interval not displayed.    Liver Function Tests: No results for input(s): AST, ALT, ALKPHOS, BILITOT, PROT, ALBUMIN  in the last 168 hours. No results for input(s): LIPASE, AMYLASE in the last 168 hours. No results for input(s): AMMONIA in the last 168 hours.  ABG    Component Value Date/Time   PHART 7.273 (L) 08/24/2013 2337   PCO2ART 28.9 (L) 08/24/2013 2337   PO2ART 84.0 08/24/2013 2337   HCO3 13.4 (L) 08/24/2013 2337   TCO2 14 08/24/2013 2337   ACIDBASEDEF 12.0 (H) 08/24/2013 2337   O2SAT 95.0 08/24/2013 2337     Coagulation Profile: Recent Labs  Lab 04/15/24 1806 04/18/24 0324  INR 1.3* 1.2    Cardiac Enzymes: No results for input(s): CKTOTAL, CKMB, CKMBINDEX, TROPONINI in the last 168 hours.  HbA1C: No results found for: HGBA1C  CBG: Recent Labs  Lab 04/20/24 1947 04/20/24 2354 04/21/24 0337 04/21/24 0733 04/21/24 1121  GLUCAP 114* 94 97 86 101*    Review of Systems:   N/A  Past Medical History:  He,  has a past medical history of Alzheimer dementia (HCC), Aortic aneurysm (HCC), Arthritis, Cancer (HCC) (2012), CKD (chronic kidney disease), stage III (HCC) (2014), Degenerative disc disease, Degenerative joint disease, GERD (gastroesophageal reflux disease), History of kidney stones, Hypercholesterolemia, Hypertension, Impaired glucose  tolerance, Muscle fatigue, Peripheral vascular disease (HCC), Pneumonia, Raynaud's disease, Sepsis (HCC) (07/2013), Stroke [facility]), Thromboembolism of left popilteal vein (2015), and UTI (urinary tract infection) (08/17/2013).   Surgical History:   Past Surgical History:  Procedure Laterality Date   ABDOMINAL AORTIC ANEURYSM REPAIR  2011   CATARACT EXTRACTION W/PHACO Left 02/15/2021   Procedure: CATARACT EXTRACTION PHACO AND INTRAOCULAR LENS PLACEMENT (IOC) LEFT 5.59 00:44.0;  Surgeon: Myrna Adine Anes, MD;  Location: [facility];  Service: Ophthalmology;  Laterality: Left;   CATARACT EXTRACTION W/PHACO Right 03/08/2021   Procedure: CATARACT EXTRACTION PHACO AND INTRAOCULAR LENS PLACEMENT (IOC) RIGHT 1.81 00:22.7;  Surgeon: Myrna Adine Anes, MD;  Location[facility];  Service: Ophthalmology;  Laterality: Right;  ESOPHAGOGASTRODUODENOSCOPY N/A 04/16/2024   Procedure: EGD (ESOPHAGOGASTRODUODENOSCOPY);  Surgeon: Jinny Carmine, MD;  Location: [facility] END[facility];  Service: Endoscopy;  Laterality: N/A;   HERNIA REPAIR     38mo old   INSERTION OF MESH N/A 10/24/2014   Procedure: INSERTION OF MESH;  Surgeon: Deward Null III, MD;  Location: [facility] OR;  Service: General;  Laterality: N/A;   LAPAROTOMY N/A 08/24/2013   Procedure: EXPLORATORY LAPAROTOMY;  Surgeon: Deward GORMAN Null DOUGLAS, MD;  Location: [facility] OR;  Service: General;  Laterality: N/A;  Gram patch closure   litrotripsy  09/24/2013   NEPHROSTOMY  Nov. 2014   nephrostomy removal  Dec. 2014   PERIPHERALLY INSERTED CENTRAL CATHETER INSERTION     TONSILLECTOMY     UMBILICAL HERNIA REPAIR     50 yr ago   VENTRAL HERNIA REPAIR N/A 10/24/2014   Procedure: LAPAROSCOPIC ASSISTED VENTRAL HERNIA REPAIR WITH MESH;  Surgeon: Deward Null III, MD;  Location: [facility] OR;  Service: General;  Laterality: N/A;   XI ROBOTIC ASSISTED INGUINAL HERNIA REPAIR WITH MESH Bilateral 11/17/2022   Procedure: XI ROBOTIC ASSISTED INGUINAL HERNIA REPAIR WITH MESH-Bilateral;  Surgeon:  Desiderio Schanz, MD;  Location: [facility] ORS;  Service: General;  Laterality: Bilateral;     Social History:   reports that he has been smoking cigarettes. He has a 40 pack-year smoking history. He has been exposed to tobacco smoke. He has never used smokeless tobacco. He reports current alcohol use of about 3.0 standard drinks of alcohol per week. He reports that he does not use drugs.   Family History:  His family history includes Atrial fibrillation in his sister; CVA in his father.   Allergies Allergies  Allergen Reactions   Duloxetine Dermatitis     Home Medications  Prior to Admission medications   Medication Sig Start Date End Date Taking? Authorizing Provider  acetaminophen  (TYLENOL ) 325 MG tablet Take 325 mg by mouth every 6 (six) hours as needed for mild pain.   Yes [provider]  amLODipine  (NORVASC ) 5 MG tablet TAKE 1 TABLET BY MOUTH  DAILY 12/24/19  Yes Brown, Fallon E, NP  cholecalciferol (VITAMIN D) 1000 UNITS tablet Take 1,000 Units by mouth daily.   Yes [provider]  Cyanocobalamin (VITAMIN B-12 ER PO) Take by mouth.   Yes [provider]  donepezil  (ARICEPT ) 10 MG tablet Take 10 mg by mouth at bedtime.   Yes [provider]  FOLIC ACID PO Take by mouth.   Yes [provider]  gabapentin  (NEURONTIN ) 100 MG capsule 200 mg 2 (two) times daily. 200 mg in am and 200 mg in PM 04/01/17  Yes [provider]  Lactobacillus ([employer] PROBIOTIC ACIDOPHILUS) 10 MG CAPS Take by mouth.   Yes [provider]  memantine (NAMENDA) 10 MG tablet TAKE 1 TABLET BY MOUTH TWICE DAILY FOR MEMORY LOSS 02/28/24  Yes [provider]  pantoprazole  (PROTONIX ) 40 MG tablet Take 40 mg by mouth at bedtime.  11/13/13  Yes [provider]  simvastatin  (ZOCOR ) 20 MG tablet Take 20 mg by mouth at bedtime.   Yes [provider]  vitamin B-12 (CYANOCOBALAMIN) 500 MCG tablet Take 500 mcg by mouth daily.   Yes [provider]  XARELTO  20 MG TABS tablet daily.  12/23/13  Yes [provider]  amoxicillin -clavulanate (AUGMENTIN ) 875-125 MG tablet :1 Tablet(s) By Mouth Every 12 Hours Patient not taking: Reported on 04/15/2024 02/20/24   [provider]  pregabalin (LYRICA) 100 MG capsule Take  100 mg by mouth 2 (two) times daily. Patient not taking: Reported on 04/15/2024 12/20/23   [provider]     The patient is critically ill due to endoleak, AKI.  Critical care was necessary to treat or prevent imminent or life-threatening deterioration. Critical care time was spent by me on the following activities: development of a treatment plan with the patient and/or surrogate as well as nursing, discussions with consultants, evaluation of the patient's response to treatment, examination of the patient, obtaining a history from the patient or surrogate, ordering and performing treatments and interventions, ordering and review of laboratory studies, ordering and review of radiographic studies, review of telemetry data including pulse oximetry, re-evaluation of patient's condition and participation in multidisciplinary rounds.   I personally spent 35 minutes providing critical care not including any separately billable procedures.   Belva November, MD [facility] 04/21/2024 12:28 PM

## 2024-04-22 ENCOUNTER — Encounter: Payer: Self-pay | Admitting: Vascular Surgery

## 2024-04-22 ENCOUNTER — Other Ambulatory Visit: Payer: Self-pay

## 2024-04-22 DIAGNOSIS — N179 Acute kidney failure, unspecified: Secondary | ICD-10-CM | POA: Diagnosis not present

## 2024-04-22 DIAGNOSIS — R042 Hemoptysis: Secondary | ICD-10-CM | POA: Diagnosis not present

## 2024-04-22 DIAGNOSIS — I161 Hypertensive emergency: Secondary | ICD-10-CM | POA: Diagnosis not present

## 2024-04-22 DIAGNOSIS — T82310A Breakdown (mechanical) of aortic (bifurcation) graft (replacement), initial encounter: Secondary | ICD-10-CM | POA: Diagnosis not present

## 2024-04-22 DIAGNOSIS — T82330A Leakage of aortic (bifurcation) graft (replacement), initial encounter: Secondary | ICD-10-CM | POA: Diagnosis not present

## 2024-04-22 DIAGNOSIS — Z95828 Presence of other vascular implants and grafts: Secondary | ICD-10-CM

## 2024-04-22 DIAGNOSIS — N189 Chronic kidney disease, unspecified: Secondary | ICD-10-CM | POA: Diagnosis not present

## 2024-04-22 DIAGNOSIS — I701 Atherosclerosis of renal artery: Secondary | ICD-10-CM | POA: Diagnosis not present

## 2024-04-22 LAB — GLUCOSE, CAPILLARY
Glucose-Capillary: 107 mg/dL — ABNORMAL HIGH (ref 70–99)
Glucose-Capillary: 108 mg/dL — ABNORMAL HIGH (ref 70–99)
Glucose-Capillary: 113 mg/dL — ABNORMAL HIGH (ref 70–99)
Glucose-Capillary: 89 mg/dL (ref 70–99)
Glucose-Capillary: 92 mg/dL (ref 70–99)
Glucose-Capillary: 96 mg/dL (ref 70–99)
Glucose-Capillary: 97 mg/dL (ref 70–99)

## 2024-04-22 LAB — BASIC METABOLIC PANEL WITH GFR
Anion gap: 10 (ref 5–15)
BUN: 42 mg/dL — ABNORMAL HIGH (ref 8–23)
CO2: 20 mmol/L — ABNORMAL LOW (ref 22–32)
Calcium: 8.5 mg/dL — ABNORMAL LOW (ref 8.9–10.3)
Chloride: 112 mmol/L — ABNORMAL HIGH (ref 98–111)
Creatinine, Ser: 2.23 mg/dL — ABNORMAL HIGH (ref 0.61–1.24)
GFR, Estimated: 29 mL/min — ABNORMAL LOW (ref >60)
Glucose, Bld: 91 mg/dL (ref 70–99)
Potassium: 4 mmol/L (ref 3.5–5.1)
Sodium: 142 mmol/L (ref 135–145)

## 2024-04-22 LAB — CBC
HCT: 39.5 % (ref 39.0–52.0)
Hemoglobin: 12.7 g/dL — ABNORMAL LOW (ref 13.0–17.0)
MCH: 29.5 pg (ref 26.0–34.0)
MCHC: 32.2 g/dL (ref 30.0–36.0)
MCV: 91.6 fL (ref 80.0–100.0)
Platelets: 142 K/uL — ABNORMAL LOW (ref 150–400)
RBC: 4.31 MIL/uL (ref 4.22–5.81)
RDW: 15.2 % (ref 11.5–15.5)
WBC: 8.1 K/uL (ref 4.0–10.5)
nRBC: 0 % (ref 0.0–0.2)

## 2024-04-22 LAB — MAGNESIUM: Magnesium: 2.3 mg/dL (ref 1.7–2.4)

## 2024-04-22 MED ORDER — METOPROLOL TARTRATE 50 MG PO TABS
50.0000 mg | ORAL_TABLET | Freq: Two times a day (BID) | ORAL | Status: DC
  Administered 2024-04-22 – 2024-04-23 (×2): 50 mg via ORAL
  Filled 2024-04-22 (×2): qty 1

## 2024-04-22 MED ORDER — POLYETHYLENE GLYCOL 3350 17 G PO PACK
17.0000 g | PACK | Freq: Every day | ORAL | Status: DC
  Administered 2024-04-22 – 2024-04-29 (×5): 17 g via ORAL
  Filled 2024-04-22 (×7): qty 1

## 2024-04-22 MED ORDER — METOPROLOL TARTRATE 25 MG PO TABS: 25.0000 mg | ORAL_TABLET | Freq: Once | ORAL | Status: DC

## 2024-04-22 MED ORDER — ACETAMINOPHEN 325 MG PO TABS
650.0000 mg | ORAL_TABLET | Freq: Four times a day (QID) | ORAL | Status: DC
  Administered 2024-04-22 – 2024-04-24 (×8): 650 mg via ORAL
  Filled 2024-04-22 (×8): qty 2

## 2024-04-22 NOTE — Progress Notes (Signed)
 NAME:  Nathan Rivera, MRN:  [id number], DOB:  08/28/1946, LOS: 7 ADMISSION DATE:  04/15/2024,  History of Present Illness:  78 y.o male with significant PMH of Infrarenal abdominal aortic aneurysm s/p endovascular repair of a ruptured AAA in 2011 on Xarelto , PAD, HTN, duodenal ulcer with perforation s/p omental patch repair, CKD stage III, thrombocytopenia, HDL, prior tobacco use, ESBL bacteremia, bladder cancer, chronic back pain, and bilateral inguinal hernia s/p repair who presented to the ED with chief complaints of abdominal pain and hemoptysis.   Patient and family at bedside reports that he took his last dose of Xarelto  yesterday evening.  He subsequently developed some abdominal pain around the epigastric and left upper quadrant region.  Since onset of symptoms has been coughing up bright red blood which he describes  blood filling up his mouth.  Denies other associated symptoms   ED Course: Initial vital signs showed HR of 68 beats/minute, BP 192/102 mm Hg, the RR 18 breaths/minute, and the oxygen saturation 93% on RA and a temperature of 98.68F (36.7C). Pertinent Labs/Diagnostics Findings: Na+/ K+:140/4.2 Glucose:94  BUN/Cr.:39/2.43 TAR:Lwmzfjmxjaoz  PCT: negative <0.10  BNP: 394.5  CTA Chest> CT Abd/pelvis> Saccular aneurysm at the level of the proximal stent with some soft tissue thickening and stranding concerning possible endoleak or impending rupture,Large aneurysm involving the left common iliac artery, partially encompasses the left iliac stent, Aneurysmal dilatation of the right common iliac artery at the distal end of the stent  Multiple small aneurysms of the proximal internal and iliac vessels.    CT findings discussed with on-call vascular Dr. Marea who recommended hemodynamic monitoring as it is unlikely patient will require emergent intervention but may require repair during the course of admission. PCCM consulted for admission, BP management and close  monitoring.  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 Aorto bifemoral endograft placement for AAA,  Chronic right-sided low back pain without sciatica;  Thrombocytopenia  Raynaud's disease with gangrene  B12 deficiency;  Personal history of malignant neoplasm of bladder;  Tobacco use disorder;  Hematuria, gross  ESBL Bacteremia due to septic shock due to obstructive stone with Pyelonephritis bilateral Inguinal Hernia s/p repair (02/24)  Significant Hospital Events: Including procedures, antibiotic start and stop dates in addition to other pertinent events   7/21: admit to ICU, on clevidipine  and esmolol  7/22: no recurrence of hemoptysis, but he continues to have a cough. EGD normal without any PUD or sign of GI bleed 7/23: worsening renal function, holding on surgical intervention 7/24: kidney function stable, ileus, N/V in the AM. Sat in chair 7/25: slept well overnight, kidney function improved. No complaints. Underwent renal artery stenting. 7/26: passing flatus, no abdominal pain 7/27: delirium, no pain reported but he is confused today 07/28: Patient complaining of new back pain, appears to be muscular. Remains on clevidipine  and emolol drip for strict bp control.   Interim History / Subjective:  Awake and alert without any acute distress. Complaining of mild back pain.   Objective    Blood pressure 130/71, pulse 63, temperature 98.1 F (36.7 C), temperature source Oral, resp. rate 14, height 5' 9 (1.753 m), weight 99.6 kg, SpO2 94%.        Intake/Output Summary (Last 24 hours) at 04/22/2024 0948 Last data filed at 04/22/2024 0856 Gross per 24 hour  Intake  1202.15 ml  Output 1525 ml  Net -322.85 ml   Filed Weights   04/20/24 0429 04/21/24 0413 04/22/24 0500   Weight: 99.5 kg 99.4 kg 99.6 kg    Examination: General: NAD HENT: Supple neck, reactive pupils Lungs: Clear bilateral air entry Cardiovascular: Normal S1, Normal S2, RRR Abdomen: Soft, non tender, non distended, +BS  Extremities: Warm and well perfused no edema.   Labs and imaging were reviewed.   Assessment and Plan  #Hemoptysis #Endoleak with Aneurysm Expansion #History of Infrarenal AAA s/p endovascular repeair s/p R renal artery stenting 07/25 with improvement in AKI. Plan for L renal artery intervention on 07/29.  #HTN emergency #AKI on CKD improving baseline Cr. 2.2 mg/dl.  #Community Acquired Pneumonia #Mild Cognitive Impairment   78 year old male presenting with an episode of hemoptysis (golf ball sized) with concern for hematemesis given history of GI bleed in the past. Patient was found to have a possible endoleak with threat of rupture and is admitted to the ICU for tight blood pressure control. He is s/p renal artery stenting.   Neuro - history of mild cognitive impairment, holding memantine and donepezil  given bradycardia. Continues to have delirium. CV - history of hypertension, and presented with elevated blood pressure. Started on strict HR and BP control given concern for endovascular leak and risk of AAA rupture. He does have significant vascular disease (bilateral severe renal artery stenosis, severe celiac artery stenosis, infrarenal abdominal aortoiliac stent graft, aneurysmal dilation of the abdominal aorta at the level of the renal arteries with a saccular component concerning for leakage vs impending rupture, left common iliac artery aneurysm). Given renal disease and need for contrast, endoleak repair is delayed, and he underwent renal artery stenosis stenting. Continue with goal SBP of 130-140 mmHg and HR < 60.             -titrate clevidipine  for goal SBP of 130-140             -titrate esmolol  for goal HR < 60 Pulm - No recurrence of his hemoptysis since his  admission. Chest CT with multiple tree in bud nodules as well as minimal ground glass opacities that were scattered, concerning for viral infection versus bacterial pneumonia/bronchitis. He remains on nasal cannula and oxygenation is improved with holding of fluids, mobilization, and incentive spirometry. GI - Underwent EGD that did not show any PUD or sign of GI bleed. Discontinued high dose PPI. CT abdomen with signs of ileus. Tried NG tube but did not tolerate. Bowel sounds are improved, and he has passed flatus. Trial clear liquid diet and ok to advance diet as tolerated. Continue with OOB to chair. NPO after midnight.  Renal - AKI on CKD, with bilateral renal artery stenosis. This was likely exacerbated by contrast exposure. Holding IV fluids and liberalizing BP requirements to allow for kidney perfusion (see CV above). Kidney function improved today.  Endo - ICU glycemic protocol Hem/Onc - on home rivaroxaban , holding given hemoptysis. H/H stable ID - Viral panel negative, on Ceftriaxone  and Azithromycin  for CAP coverage. Sputum culture no growth.   Best Practice (right click and Reselect all SmartList Selections daily)   Diet/type: Regular consistency (see orders) DVT prophylaxis not indicated Pressure ulcer(s): N/A GI prophylaxis: N/A Lines: N/A Foley:  N/A Code Status:  full code  Last date of multidisciplinary goals of care discussion [04/22/2024`]  Critical care time: 60 minutes    Darrin Barn, MD [facility] 04/22/2024 10:03 AM

## 2024-04-22 NOTE — Progress Notes (Signed)
 Progress Note    04/22/2024 7:40 AM 3 Days Post-Op  Subjective:  Nathan Rivera is a 78 yo male now POD #3 From:  Procedure(s) Performed:             1.  Ultrasound guidance for vascular access right femoral artery             2.  Catheter placement into right renal artery from right femoral approach             3.  Aortogram and selective right renal angiogram             4.  Balloon expandable stent placement to the right renal artery with a 7 mm diameter x 26 mm length Lifestream stent             5.  StarClose closure device right femoral artery  The patient is a 78 year old male with severe hypertension and ischemic nephropathy with a previous CT scan suggesting severe bilateral renal artery stenosis with worsening severe hypertension despite multiple home medications. The patient has suboptimal blood pressure control despite multiple antihypertensives and a noninvasive study demonstrating hemodynamically significant bilateral renal artery stenosis.  He was admitted to the hospital with a hypertensive crisis and also found to have a large type Ib endoleak with his previously repaired abdominal aortic aneurysm as given the clinical scenario and the noninvasive findings, angiogram is indicated for further evaluation of her renal artery and potential treatment.   Vitals:   04/22/24 0700 04/22/24 0730  BP: (!) 140/74 (!) 140/73  Pulse: 62 64  Resp: 14 16  Temp:    SpO2: 94% 95%   Physical Exam: Cardiac:  Regular Rate but bradycardic at 40 bpm's. No murmurs appreciated.  Lungs:  Non labored clear throughout on auscultation.  No rales rhonchi or wheezing. Incisions: None Extremities: All extremities with palpable pulses and warm to touch. Abdomen: Positive bowel sounds throughout, soft, nontender and nondistended Neurologic: Alert and oriented x 3, answers all questions and follows commands appropriately.  CBC    Component Value Date/Time   WBC 8.1 04/22/2024 0515   RBC 4.31  04/22/2024 0515   HGB 12.7 (L) 04/22/2024 0515   HGB 10.3 (L) 08/18/2013 0504   HCT 39.5 04/22/2024 0515   HCT 30.9 (L) 08/18/2013 0504   PLT 142 (L) 04/22/2024 0515   PLT 161 08/18/2013 0504   MCV 91.6 04/22/2024 0515   MCV 86 08/18/2013 0504   MCH 29.5 04/22/2024 0515   MCHC 32.2 04/22/2024 0515   RDW 15.2 04/22/2024 0515   RDW 16.0 (H) 08/18/2013 0504   LYMPHSABS 0.9 04/19/2024 0232   LYMPHSABS 2.3 08/18/2013 0504   MONOABS 0.7 04/19/2024 0232   MONOABS 1.1 (H) 08/18/2013 0504   EOSABS 0.1 04/19/2024 0232   EOSABS 0.2 08/18/2013 0504   BASOSABS 0.0 04/19/2024 0232   BASOSABS 0.0 08/18/2013 0504    BMET    Component Value Date/Time   NA 142 04/22/2024 0515   NA 144 10/23/2014 1630   K 4.0 04/22/2024 0515   K 4.1 10/23/2014 1630   CL 112 (H) 04/22/2024 0515   CL 112 (H) 10/23/2014 1630   CO2 20 (L) 04/22/2024 0515   CO2 26 10/23/2014 1630   GLUCOSE 91 04/22/2024 0515   GLUCOSE 83 10/23/2014 1630   BUN 42 (H) 04/22/2024 0515   BUN 30 (H) 10/23/2014 1630   CREATININE 2.23 (H) 04/22/2024 0515   CREATININE 1.96 (H) 10/23/2014 1630   CALCIUM 8.5 (L)  04/22/2024 0515   CALCIUM 9.0 10/23/2014 1630   GFRNONAA 29 (L) 04/22/2024 0515   GFRNONAA 36 (L) 10/23/2014 1630   GFRNONAA 8 (L) 08/18/2013 0504   GFRAA 39 (L) 10/25/2014 0352   GFRAA 44 (L) 10/23/2014 1630   GFRAA 9 (L) 08/18/2013 0504    INR    Component Value Date/Time   INR 1.2 04/18/2024 0324   INR 1.5 10/20/2014 0833     Intake/Output Summary (Last 24 hours) at 04/22/2024 0740 Last data filed at 04/22/2024 0700 Gross per 24 hour  Intake 1267.72 ml  Output 1425 ml  Net -157.28 ml     Assessment/Plan:  78 y.o. male is s/p See ABOVE 3 Days Post-Op   PLAN Patients kidney status continue to improve.  Keep Blood Pressure Systolic at 130-140. OOB to chair and ambulate in halls AAA repair planned for Wednesday 04/24/24 in the vascular Lab. Will continue to monitor patients kidney status.  Continue ASA 81  MG Daily and Plavix  75 mg Daily.   DVT prophylaxis:  AS 81 mg and Plavix  75 mg    Jeweldean Drohan R Dontreal [facility] 04/22/2024 7:40 AM

## 2024-04-22 NOTE — TOC Progression Note (Signed)
 Transition of Care [facility]) - Progression Note    Patient Details  Name: Nathan Rivera MRN: [id number] Date of Birth: October 12, 1945  [facility] CM/SW Contact  K'La JINNY Ruts, LCSW Phone Number: 04/22/2024, 1:31 PM  Clinical Narrative:    Chart reviewed. I spoke to the patient wife, [name] and she reports that she would like to wait for SNF list due to patient having surgery on Wednesday. [name] stated that she will follow up with me on Friday for final decision.     Expected Discharge Plan: Home w Home Health Services                 Expected Discharge Plan and Services       Living arrangements for the past 2 months: Single Family Home                                       Social Drivers of Health (SDOH) Interventions SDOH Screenings   Food Insecurity: No Food Insecurity (04/16/2024)  Housing: Low Risk  (04/16/2024)  Transportation Needs: No Transportation Needs (04/16/2024)  Utilities: Not At Risk (04/16/2024)  Financial Resource Strain: Low Risk  (07/19/2023)   Received from [facility]  Social Connections: Unknown (04/17/2024)  Tobacco Use: High Risk (04/19/2024)    Readmission Risk Interventions     No data to display

## 2024-04-22 NOTE — Evaluation (Signed)
 Occupational Therapy Evaluation Patient Details Name: Nathan Rivera MRN: [id number] DOB: 1946-08-23 Today's Date: 04/22/2024   History of Present Illness   Pt is a 78 y.o male with PMH that includes: Infrarenal abdominal aortic aneurysm s/p endovascular repair of a ruptured AAA in 2011 on Xarelto , PAD, HTN, duodenal ulcer with perforation s/p omental patch repair, CKD stage III, thrombocytopenia, HDL, prior tobacco use, ESBL bacteremia, bladder cancer, chronic back pain, and bilateral inguinal hernia s/p repair who presented to the ED with chief complaints of abdominal pain and hemoptysis.  MD assessment includes: hemoptysis, endoleak with aneurysm expansion, HTN emergency, AKI on CKD, CAP, and mild cognitive impairment.     Clinical Impressions Pt was seen for OT evaluation this date. Pt alert and oriented x2, presenting with trouble word finding often looking to his wife to answer the questions. Prior to hospital admission, pt was mostly indep requiring assistance for supervision for safety with IADLs, pt's wife stated his need for assistance varies depending on his cognitive state, that 95% of the time he needs no assistance for his BADLs. Pt lives in multi level home but able to live on the main level. Pt presents to acute OT demonstrating impaired ADL performance and functional mobility 2/2 (See OT problem list for additional functional deficits). MAXA for LB dressing at bed level, pt stating pain/stiffness limiting current ADL completion. Pt currently requires MODA for bed mobility due to trouble with sequencing and reasoning the need for transfer. Once seated on the EOB the pt STS from slightly elevated bed level with use of the RW + CGA, pt required hand over hand assistance for hand placement on RW. Pt took ~5 steps to the recliner with no overt LOB and good DME management. Pt endorses feeling very tired and wanting to rest. Pt left with all needs in reach with his wife at his side. Pt would  benefit from skilled OT services to address noted impairments and functional limitations (see below for any additional details) in order to maximize safety and independence while minimizing falls risk and caregiver burden. OT will follow acutely.     If plan is discharge home, recommend the following:   A little help with walking and/or transfers;A little help with bathing/dressing/bathroom;Direct supervision/assist for medications management;Direct supervision/assist for financial management;Assist for transportation;Supervision due to cognitive status;Assistance with cooking/housework     Functional Status Assessment   Patient has had a recent decline in their functional status and demonstrates the ability to make significant improvements in function in a reasonable and predictable amount of time.     Equipment Recommendations   Other (comment) (Defer to next venue of care)     Recommendations for Other Services         Precautions/Restrictions   Precautions Precautions: Fall Recall of Precautions/Restrictions: Impaired Restrictions Weight Bearing Restrictions Per Provider Order: No     Mobility Bed Mobility Overal bed mobility: Needs Assistance Bed Mobility: Supine to Sit     Supine to sit: Mod assist, HOB elevated, Used rails     General bed mobility comments: Verbal cues for sequencing, physical assistance for LE/hand placement    Transfers Overall transfer level: Needs assistance Equipment used: Rolling walker (2 wheels) Transfers: Sit to/from Stand Sit to Stand: Contact guard assist, From elevated surface           General transfer comment: CGA STS from EOB with multimodal cues for hand placement and technique      Balance Overall balance assessment: Needs assistance Sitting-balance  support: Bilateral upper extremity supported, Feet supported Sitting balance-Leahy Scale: Good Sitting balance - Comments: Steady reaching within BOS   Standing  balance support: Bilateral upper extremity supported, During functional activity, Reliant on assistive device for balance Standing balance-Leahy Scale: Fair Standing balance comment: Heavy reliant on RW for static standing, slight forward flexion noted                           ADL either performed or assessed with clinical judgement   ADL Overall ADL's : Needs assistance/impaired       Grooming Details (indicate cue type and reason): Anticipate set up A for all grooming tasks             Lower Body Dressing: Maximal assistance;Bed level Lower Body Dressing Details (indicate cue type and reason): Pt reports pain at L hip impacting LB dressing abilities on this date. Toilet Transfer: Ambulation;Rolling walker (2 wheels);BSC/3in1 Toilet Transfer Details (indicate cue type and reason): Simulated transfers from EOB<>recliner with CGA + heavy verbal/visual cues throughout         Functional mobility during ADLs: Contact guard assist;Rolling walker (2 wheels);Cueing for sequencing;Cueing for safety General ADL Comments: Set up seated grooming tasks, CGA simulated toilet transfer                                Pertinent Vitals/Pain Pain Assessment Pain Assessment: Faces Faces Pain Scale: Hurts little more Pain Location: L hip Pain Descriptors / Indicators: Discomfort Pain Intervention(s): Monitored during session, Limited activity within patient's tolerance, Repositioned     Extremity/Trunk Assessment Upper Extremity Assessment Upper Extremity Assessment: Generalized weakness   Lower Extremity Assessment Lower Extremity Assessment: Generalized weakness;Defer to PT evaluation   Cervical / Trunk Assessment Cervical / Trunk Assessment: Normal   Communication Communication Communication: Impaired Factors Affecting Communication: Difficulty expressing self   Cognition Arousal: Alert Behavior During Therapy: WFL for tasks assessed/performed Cognition:  Cognition impaired   Orientation impairments: Time, Situation Awareness: Intellectual awareness intact Memory impairment (select all impairments): Short-term memory, Declarative long-term memory, Non-declarative long-term memory, Working Biochemist, clinical functioning impairment (select all impairments): Initiation, Reasoning, Problem solving, Organization, Sequencing OT - Cognition Comments: A/Ox2, trouble word finding when asked questions                 Following commands: Impaired Following commands impaired: Follows one step commands with increased time     Cueing  General Comments   Cueing Techniques: Verbal cues;Tactile cues;Visual cues  VSS   Exercises Exercises: Other exercises Other Exercises Other Exercises: Edu Pt/wife: Role of OT eval, safe ADL completion, DME needs, benefits of rehab and therapy, fall prevention techniques   Shoulder Instructions      Home Living Family/patient expects to be discharged to:: Private residence Living Arrangements: Spouse/significant other Available Help at Discharge: Family;Available 24 hours/day Type of Home: House Home Access: Stairs to enter [employer] of Steps: 3 Entrance Stairs-Rails: Right Home Layout: Two level;Able to live on main level with bedroom/bathroom     Bathroom Shower/Tub: Arts development officer Toilet: Handicapped height     Home Equipment: Agricultural consultant (2 wheels);Rollator (4 wheels);Cane - single point;Shower seat - built in;BSC/3in1   Additional Comments: History and PLOF provided by spouse secondary to pt cognition      Prior Functioning/Environment Prior Level of Function : Independent/Modified Independent;History of Falls (last six months)  Mobility Comments: Mod Ind amb community distances with a SPC, 2 falls in the last year secondary to tripping ADLs Comments: Mostly Ind with ADLs, more recently wife states he has been needing help with IADLs due to  cognition and help with meal prep for safety concerns. A few months ago pt was golfing multiple times a week and much clear thinking per report from pt's wife.    OT Problem List: Decreased strength;Decreased activity tolerance;Impaired balance (sitting and/or standing);Decreased cognition;Decreased safety awareness;Decreased knowledge of use of DME or AE;Decreased knowledge of precautions   OT Treatment/Interventions: Self-care/ADL training;Energy conservation;DME and/or AE instruction;Therapeutic activities;Balance training;Patient/family education      OT Goals(Current goals can be found in the care plan section)   Acute Rehab OT Goals Patient Stated Goal: To go home OT Goal Formulation: With patient/family Time For Goal Achievement: 05/06/24 Potential to Achieve Goals: Good ADL Goals Pt Will Perform Grooming: with supervision;standing Pt Will Perform Lower Body Dressing: with supervision;sit to/from stand Pt Will Transfer to Toilet: with supervision;ambulating Pt Will Perform Toileting - Clothing Manipulation and hygiene: with supervision;sit to/from stand   OT Frequency:  Min 3X/week    Co-evaluation              AM-PAC OT 6 Clicks Daily Activity     Outcome Measure Help from another person eating meals?: None Help from another person taking care of personal grooming?: A Little Help from another person toileting, which includes using toliet, bedpan, or urinal?: A Little Help from another person bathing (including washing, rinsing, drying)?: A Little Help from another person to put on and taking off regular upper body clothing?: A Little Help from another person to put on and taking off regular lower body clothing?: A Lot 6 Click Score: 18   End of Session Equipment Utilized During Treatment: Gait belt;Rolling walker (2 wheels);Oxygen Nurse Communication: Mobility status  Activity Tolerance: Patient tolerated treatment well Patient left: in chair;with call  bell/phone within reach;with chair alarm set  OT Visit Diagnosis: Unsteadiness on feet (R26.81);Other abnormalities of gait and mobility (R26.89);Repeated falls (R29.6);Muscle weakness (generalized) (M62.81);Other symptoms and signs involving cognitive function                Time: 9157-9094 OT Time Calculation (min): 23 min Charges:  OT General Charges $OT Visit: 1 Visit OT Evaluation $OT Eval Moderate Complexity: 1 Mod OT Treatments $Self Care/Home Management : 8-22 mins  Larraine Colas M.S. OTR/L  04/22/24, 9:36 AM

## 2024-04-22 NOTE — Progress Notes (Signed)
 [facility]  ROUNDING NOTE   Subjective:   Patient with past medical history of peripheral arterial disease, hypertension, abdominal aortic aneurysm, hyperlipidemia, bladder cancer, thrombocytopenia who was admitted with hypertensive crisis, abdominal aneurysm, type I endoleak of aortic graft.  Update: Patient resting comfortably in bed today. Creatinine down to 2.2. Urine output good at 1.4 L over the preceding 24 hours. Patient due for additional vascular procedures tomorrow.  07/27 0701 - 07/28 0700 In: 1387.7 [P.O.:600; I.V.:787.7] Out: 1425 [Urine:1425] Lab Results  Component Value Date   CREATININE 2.23 (H) 04/22/2024   CREATININE 2.54 (H) 04/21/2024   CREATININE 2.64 (H) 04/20/2024     Objective:  Vital signs in last 24 hours:  Temp:  [97.9 F (36.6 C)-98.5 F (36.9 C)] 98.1 F (36.7 C) (07/28 0752) Pulse Rate:  [44-65] 63 (07/28 0815) Resp:  [11-17] 14 (07/28 0815) BP: (113-152)/(59-96) 130/71 (07/28 0815) SpO2:  [88 %-100 %] 94 % (07/28 0815) Weight:  [99.6 kg] 99.6 kg (07/28 0500)  Weight change: 0.2 kg Filed Weights   04/20/24 0429 04/21/24 0413 04/22/24 0500  Weight: 99.5 kg 99.4 kg 99.6 kg    Intake/Output: I/O last 3 completed shifts: In: 2204.3 [P.O.:600; I.V.:1604.3] Out: 2125 [Urine:2125]   Intake/Output this shift:  Total I/O In: 65.3 [I.V.:65.3] Out: 100 [Urine:100]  Physical Exam: General: No acute distress  Head: Normocephalic, atraumatic. Moist oral mucosal membranes  Neck: Supple  Lungs:  Scattered rhonchi, normal effort  Heart: S1S2 no rubs  Abdomen:  Soft, nontender, bowel sounds present  Extremities: Trace peripheral edema.  Neurologic: Awake, alert, following commands  Skin: No acute rash  Access: No hemodialysis access  External urinary collection system  Basic Metabolic Panel: Recent Labs  Lab 04/16/24 0339 04/17/24 0357 04/18/24 0324 04/19/24 0232 04/20/24 0520 04/21/24 0413 04/22/24 0515  NA 142  140 139 139 140 137 142  K 4.2 4.4 4.1 4.5 4.4 3.9 4.0  CL 108 107 107 111 111 110 112*  CO2 24 20* 21* 19* 17* 16* 20*  GLUCOSE 96 102* 88 76 61* 91 91  BUN 43* 50* 50* 54* 50* 46* 42*  CREATININE 2.44* 3.08* 3.20* 2.86* 2.64* 2.54* 2.23*  CALCIUM 8.8* 8.7* 8.4* 7.9* 8.5* 8.5* 8.5*  MG 2.3 2.3 2.1 2.3 2.3  --  2.3  PHOS 3.9 4.3 5.1* 5.4* 4.7*  --   --     Liver Function Tests: No results for input(s): AST, ALT, ALKPHOS, BILITOT, PROT, ALBUMIN  in the last 168 hours. No results for input(s): LIPASE, AMYLASE in the last 168 hours. No results for input(s): AMMONIA in the last 168 hours.  CBC: Recent Labs  Lab 04/17/24 0836 04/18/24 0324 04/19/24 0232 04/20/24 0520 04/21/24 0413 04/22/24 0515  WBC 11.2* 11.0* 9.1 8.8 8.4 8.1  NEUTROABS 8.9* 8.7* 7.3  --   --   --   HGB 16.6 14.3 12.7* 13.4 12.6* 12.7*  HCT RESULTS UNAVAILABLE DUE TO INTERFERING SUBSTANCE 42.5 38.9* 41.8 37.8* 39.5  MCV RESULTS UNAVAILABLE DUE TO INTERFERING SUBSTANCE 90.6 92.8 93.5 92.4 91.6  PLT 156 144* 117* 140* 132* 142*    Cardiac Enzymes: No results for input(s): CKTOTAL, CKMB, CKMBINDEX, TROPONINI in the last 168 hours.  BNP: Invalid input(s): POCBNP  CBG: Recent Labs  Lab 04/21/24 1634 04/21/24 1932 04/22/24 0006 04/22/24 0356 04/22/24 0717  GLUCAP 120* 115* 92 97 89    Microbiology: Results for orders placed or performed during the hospital encounter of 04/15/24  Blood culture (routine x 2)  Status: None   Collection Time: 04/15/24  8:55 PM   Specimen: BLOOD  Result Value Ref Range Status   Specimen Description BLOOD BLOOD RIGHT ARM  Final   Special Requests   Final    BOTTLES DRAWN AEROBIC AND ANAEROBIC Blood Culture adequate volume   Culture   Final    NO GROWTH 5 DAYS Performed at [facility], [address] [place], [place] [address]    Report Status 04/20/2024 FINAL  Final  Blood culture (routine x 2)     Status: None   Collection  Time: 04/15/24  8:55 PM   Specimen: BLOOD  Result Value Ref Range Status   Specimen Description BLOOD BLOOD LEFT ARM  Final   Special Requests   Final    BOTTLES DRAWN AEROBIC AND ANAEROBIC Blood Culture results may not be optimal due to an inadequate volume of blood received in culture bottles   Culture   Final    NO GROWTH 5 DAYS Performed at [facility], [address], [place], [place] [address]    Report Status 04/20/2024 FINAL  Final  MRSA Next Gen by PCR, Nasal     Status: None   Collection Time: 04/15/24 10:01 PM   Specimen: Nasal Mucosa; Nasal Swab  Result Value Ref Range Status   MRSA by PCR Next Gen NOT DETECTED NOT DETECTED Final    Comment: (NOTE) The GeneXpert MRSA Assay (FDA approved for NASAL specimens only), is one component of a comprehensive MRSA colonization surveillance program. It is not intended to diagnose MRSA infection nor to guide or monitor treatment for MRSA infections. Test performance is not FDA approved in patients less than 29 years old. Performed at [facility], [address] [place], [place] [address]   Culture, Respiratory w Gram Stain     Status: None   Collection Time: 04/16/24 11:42 AM   Specimen: SPU; Respiratory  Result Value Ref Range Status   Specimen Description   Final    SPUTUM Performed at [facility], [address], [place], [place] [address]    Special Requests   Final     EXPSU Performed at [facility], [address] [place], [place] [address]    Gram Stain   Final    ABUNDANT SQUAMOUS EPITHELIAL CELLS PRESENT MODERATE GRAM POSITIVE COCCI MODERATE GRAM NEGATIVE RODS    Culture   Final    Normal respiratory flora-no Staph aureus or Pseudomonas seen Performed at [facility], [address] [place], [place] [address]    Report Status 04/18/2024 FINAL  Final  Respiratory (~20 pathogens) panel by PCR     Status: None   Collection Time: 04/16/24 11:42 AM   Specimen:  Expectorated Sputum; Respiratory  Result Value Ref Range Status   Adenovirus NOT DETECTED NOT DETECTED Final   Coronavirus 229E NOT DETECTED NOT DETECTED Final    Comment: (NOTE) The Coronavirus on the Respiratory Panel, DOES NOT test for the novel  Coronavirus (2019 nCoV)    Coronavirus HKU1 NOT DETECTED NOT DETECTED Final   Coronavirus NL63 NOT DETECTED NOT DETECTED Final   Coronavirus OC43 NOT DETECTED NOT DETECTED Final   Metapneumovirus NOT DETECTED NOT DETECTED Final   Rhinovirus / Enterovirus NOT DETECTED NOT DETECTED Final   Influenza A NOT DETECTED NOT DETECTED Final   Influenza B NOT DETECTED NOT DETECTED Final   Parainfluenza Virus 1 NOT DETECTED NOT DETECTED Final   Parainfluenza Virus 2 NOT DETECTED NOT DETECTED  Final   Parainfluenza Virus 3 NOT DETECTED NOT DETECTED Final   Parainfluenza Virus 4 NOT DETECTED NOT DETECTED Final   Respiratory Syncytial Virus NOT DETECTED NOT DETECTED Final   Bordetella pertussis NOT DETECTED NOT DETECTED Final   Bordetella Parapertussis NOT DETECTED NOT DETECTED Final   Chlamydophila pneumoniae NOT DETECTED NOT DETECTED Final   Mycoplasma pneumoniae NOT DETECTED NOT DETECTED Final    Comment: Performed at [facility], [address] [place], [place] [address]    Coagulation Studies: No results for input(s): LABPROT, INR in the last 72 hours.   Urinalysis: No results for input(s): COLORURINE, LABSPEC, PHURINE, GLUCOSEU, HGBUR, BILIRUBINUR, KETONESUR, PROTEINUR, UROBILINOGEN, NITRITE, LEUKOCYTESUR in the last 72 hours.  Invalid input(s): APPERANCEUR    Imaging: No results found.    Medications:    clevidipine  4 mg/hr (04/22/24 0856)   dextrose  20 mL/hr at 04/22/24 0856   esmolol  25 mcg/kg/min (04/22/24 0856)    acetaminophen   650 mg Oral Q6H   amLODipine   10 mg Oral Daily   aspirin  EC  81 mg Oral Daily   Chlorhexidine  Gluconate Cloth  6 each Topical Daily   clopidogrel   75 mg Oral Daily    hydrALAZINE   50 mg Oral Q8H   metoprolol  tartrate  25 mg Oral BID   polyethylene glycol  17 g Oral Daily   docusate sodium , fentaNYL  (SUBLIMAZE ) injection, hydrALAZINE , ondansetron  (ZOFRAN ) IV, mouth rinse  Assessment/ Plan:  78 y.o. male with past medical history of peripheral arterial disease, hypertension, abdominal aortic aneurysm, hyperlipidemia, bladder cancer, thrombocytopenia who was admitted with hypertensive crisis, abdominal aneurysm, endoleak of aortic graft.  1.  Acute kidney injury/chronic kidney disease stage IIIb -baseline creatinine 2.2 with EGFR 30 on 01/16/2024.  Acute kidney injury likely multifactorial with contribution from hypoperfusion from bilateral renal artery stenosis, and exposure to contrast with CT angio chest/pelvis/abdomen.   07/27 0701 - 07/28 0700 In: 1387.7 [P.O.:600; I.V.:787.7] Out: 1425 [Urine:1425] Lab Results  Component Value Date   CREATININE 2.23 (H) 04/22/2024   CREATININE 2.54 (H) 04/21/2024   CREATININE 2.64 (H) 04/20/2024   Update: Renal function continues to slowly improve with creatinine down to 2.2 and urine output up to 1.4 L.  Patient due for additional vascular procedure tomorrow.  We will need to monitor renal parameters again closely given likely contrast exposure.  2.  Hypertension.  Patient maintained on both clevidipine  and esmolol  drip.  Also on hydralazine  and metoprolol .  3.  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 Marea. Right renal artery stent placed.  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 Vascular surgery team is following along.    LOS: 7 Delainee Tramel 7/28/202510:03 AM

## 2024-04-22 NOTE — Plan of Care (Signed)
  Problem: Clinical Measurements: Goal: Ability to maintain clinical measurements within normal limits will improve Outcome: Progressing Goal: Will remain free from infection Outcome: Progressing   Problem: Coping: Goal: Level of anxiety will decrease Outcome: Progressing   Problem: Elimination: Goal: Will not experience complications related to bowel motility Outcome: Progressing Goal: Will not experience complications related to urinary retention Outcome: Progressing   Problem: Pain Managment: Goal: General experience of comfort will improve and/or be controlled Outcome: Progressing   Problem: Safety: Goal: Ability to remain free from injury will improve Outcome: Progressing

## 2024-04-22 NOTE — Plan of Care (Addendum)
 Patient continues to be alert to self only. Esmolol  and Cleviprex  titrated to keep SBP 130-140's and HR less than 60. Patient without BM since 04/15/24, has active BS in all 4 quadrants. Patient has been able to sleep this shift and return to sleep after care provided. Denies pain this shift. Problem: Coping: Goal: Level of anxiety will decrease Outcome: Progressing   Problem: Elimination: Goal: Will not experience complications related to urinary retention Outcome: Progressing   Problem: Pain Managment: Goal: General experience of comfort will improve and/or be controlled Outcome: Progressing   Problem: Safety: Goal: Ability to remain free from injury will improve Outcome: Progressing   Problem: Elimination: Goal: Will not experience complications related to bowel motility Outcome: Not Progressing

## 2024-04-23 DIAGNOSIS — N189 Chronic kidney disease, unspecified: Secondary | ICD-10-CM | POA: Diagnosis not present

## 2024-04-23 DIAGNOSIS — I161 Hypertensive emergency: Secondary | ICD-10-CM | POA: Diagnosis not present

## 2024-04-23 DIAGNOSIS — T82330A Leakage of aortic (bifurcation) graft (replacement), initial encounter: Secondary | ICD-10-CM | POA: Diagnosis not present

## 2024-04-23 DIAGNOSIS — T82310A Breakdown (mechanical) of aortic (bifurcation) graft (replacement), initial encounter: Secondary | ICD-10-CM | POA: Diagnosis not present

## 2024-04-23 DIAGNOSIS — N179 Acute kidney failure, unspecified: Secondary | ICD-10-CM | POA: Diagnosis not present

## 2024-04-23 DIAGNOSIS — I701 Atherosclerosis of renal artery: Secondary | ICD-10-CM | POA: Diagnosis not present

## 2024-04-23 DIAGNOSIS — R042 Hemoptysis: Secondary | ICD-10-CM | POA: Diagnosis not present

## 2024-04-23 LAB — PROTIME-INR
INR: 1.1 (ref 0.8–1.2)
Prothrombin Time: 14.5 s (ref 11.4–15.2)

## 2024-04-23 LAB — CBC WITH DIFFERENTIAL/PLATELET
Abs Immature Granulocytes: 0.06 K/uL (ref 0.00–0.07)
Basophils Absolute: 0 K/uL (ref 0.0–0.1)
Basophils Relative: 0 %
Eosinophils Absolute: 0.5 K/uL (ref 0.0–0.5)
Eosinophils Relative: 6 %
HCT: 36.8 % — ABNORMAL LOW (ref 39.0–52.0)
Hemoglobin: 12.2 g/dL — ABNORMAL LOW (ref 13.0–17.0)
Immature Granulocytes: 1 %
Lymphocytes Relative: 16 %
Lymphs Abs: 1.3 K/uL (ref 0.7–4.0)
MCH: 30.1 pg (ref 26.0–34.0)
MCHC: 33.2 g/dL (ref 30.0–36.0)
MCV: 90.9 fL (ref 80.0–100.0)
Monocytes Absolute: 0.8 K/uL (ref 0.1–1.0)
Monocytes Relative: 10 %
Neutro Abs: 5.5 K/uL (ref 1.7–7.7)
Neutrophils Relative %: 67 %
Platelets: 138 K/uL — ABNORMAL LOW (ref 150–400)
RBC: 4.05 MIL/uL — ABNORMAL LOW (ref 4.22–5.81)
RDW: 15.3 % (ref 11.5–15.5)
WBC: 8.1 K/uL (ref 4.0–10.5)
nRBC: 0 % (ref 0.0–0.2)

## 2024-04-23 LAB — GLUCOSE, CAPILLARY
Glucose-Capillary: 92 mg/dL (ref 70–99)
Glucose-Capillary: 80 mg/dL (ref 70–99)
Glucose-Capillary: 92 mg/dL (ref 70–99)
Glucose-Capillary: 100 mg/dL — ABNORMAL HIGH (ref 70–99)
Glucose-Capillary: 108 mg/dL — ABNORMAL HIGH (ref 70–99)
Glucose-Capillary: 105 mg/dL — ABNORMAL HIGH (ref 70–99)

## 2024-04-23 LAB — BASIC METABOLIC PANEL WITH GFR
Anion gap: 7 (ref 5–15)
BUN: 38 mg/dL — ABNORMAL HIGH (ref 8–23)
CO2: 22 mmol/L (ref 22–32)
Calcium: 8.4 mg/dL — ABNORMAL LOW (ref 8.9–10.3)
Chloride: 113 mmol/L — ABNORMAL HIGH (ref 98–111)
Creatinine, Ser: 2.08 mg/dL — ABNORMAL HIGH (ref 0.61–1.24)
GFR, Estimated: 32 mL/min — ABNORMAL LOW (ref >60)
Glucose, Bld: 85 mg/dL (ref 70–99)
Potassium: 3.8 mmol/L (ref 3.5–5.1)
Sodium: 142 mmol/L (ref 135–145)

## 2024-04-23 LAB — MAGNESIUM: Magnesium: 2.3 mg/dL (ref 1.7–2.4)

## 2024-04-23 LAB — PHOSPHORUS: Phosphorus: 2.7 mg/dL (ref 2.5–4.6)

## 2024-04-23 MED ORDER — ADULT MULTIVITAMIN W/MINERALS CH
1.0000 | ORAL_TABLET | Freq: Every day | ORAL | Status: DC
  Administered 2024-04-24 – 2024-04-30 (×6): 1 via ORAL
  Filled 2024-04-23 (×6): qty 1

## 2024-04-23 MED ORDER — METOPROLOL TARTRATE 25 MG PO TABS
25.0000 mg | ORAL_TABLET | Freq: Two times a day (BID) | ORAL | Status: DC
  Administered 2024-04-23 – 2024-04-30 (×13): 25 mg via ORAL
  Filled 2024-04-23 (×14): qty 1

## 2024-04-23 MED ORDER — ENSURE PLUS HIGH PROTEIN PO LIQD
237.0000 mL | Freq: Three times a day (TID) | ORAL | Status: DC
  Administered 2024-04-23 – 2024-04-29 (×8): 237 mL via ORAL

## 2024-04-23 NOTE — Plan of Care (Signed)

## 2024-04-23 NOTE — Progress Notes (Signed)
 [facility]  ROUNDING NOTE   Subjective:   Patient with past medical history of peripheral arterial disease, hypertension, abdominal aortic aneurysm, hyperlipidemia, bladder cancer, thrombocytopenia who was admitted with hypertensive crisis, abdominal aneurysm, type I endoleak of aortic graft.  Update: Renal function improved. Creatinine currently down to 2.08. Urine output also reasonable at 1.2 L.  07/28 0701 - 07/29 0700 In: 956.7 [P.O.:240; I.V.:716.7] Out: 1200 [Urine:1200] Lab Results  Component Value Date   CREATININE 2.08 (H) 04/23/2024   CREATININE 2.23 (H) 04/22/2024   CREATININE 2.54 (H) 04/21/2024     Objective:  Vital signs in last 24 hours:  Temp:  [97.4 F (36.3 C)-98.5 F (36.9 C)] 98.5 F (36.9 C) (07/29 1145) Pulse Rate:  [31-87] 54 (07/29 1230) Resp:  [13-22] 16 (07/29 1300) BP: (103-153)/(50-102) 148/97 (07/29 1300) SpO2:  [88 %-100 %] 96 % (07/29 1230) Weight:  [99.6 kg] 99.6 kg (07/29 0704)  Weight change:  Filed Weights   04/21/24 0413 04/22/24 0500 04/23/24 0704  Weight: 99.4 kg 99.6 kg 99.6 kg    Intake/Output: I/O last 3 completed shifts: In: 1316.4 [P.O.:240; I.V.:1076.4] Out: 2050 [Urine:2050]   Intake/Output this shift:  Total I/O In: 130.4 [I.V.:130.4] Out: -   Physical Exam: General: No acute distress  Head: Normocephalic, atraumatic. Moist oral mucosal membranes  Neck: Supple  Lungs:  Scattered rhonchi, normal effort  Heart: S1S2 no rubs  Abdomen:  Soft, nontender, bowel sounds present  Extremities: Trace peripheral edema.  Neurologic: Awake, alert, following commands  Skin: No acute rash  Access: No hemodialysis access  External urinary collection system  Basic Metabolic Panel: Recent Labs  Lab 04/17/24 0357 04/18/24 0324 04/19/24 0232 04/20/24 0520 04/21/24 0413 04/22/24 0515 04/23/24 0311  NA 140 139 139 140 137 142 142  K 4.4 4.1 4.5 4.4 3.9 4.0 3.8  CL 107 107 111 111 110 112* 113*  CO2 20*  21* 19* 17* 16* 20* 22  GLUCOSE 102* 88 76 61* 91 91 85  BUN 50* 50* 54* 50* 46* 42* 38*  CREATININE 3.08* 3.20* 2.86* 2.64* 2.54* 2.23* 2.08*  CALCIUM 8.7* 8.4* 7.9* 8.5* 8.5* 8.5* 8.4*  MG 2.3 2.1 2.3 2.3  --  2.3 2.3  PHOS 4.3 5.1* 5.4* 4.7*  --   --  2.7    Liver Function Tests: No results for input(s): AST, ALT, ALKPHOS, BILITOT, PROT, ALBUMIN  in the last 168 hours. No results for input(s): LIPASE, AMYLASE in the last 168 hours. No results for input(s): AMMONIA in the last 168 hours.  CBC: Recent Labs  Lab 04/17/24 0836 04/18/24 0324 04/19/24 0232 04/20/24 0520 04/21/24 0413 04/22/24 0515 04/23/24 0311  WBC 11.2* 11.0* 9.1 8.8 8.4 8.1 8.1  NEUTROABS 8.9* 8.7* 7.3  --   --   --  5.5  HGB 16.6 14.3 12.7* 13.4 12.6* 12.7* 12.2*  HCT RESULTS UNAVAILABLE DUE TO INTERFERING SUBSTANCE 42.5 38.9* 41.8 37.8* 39.5 36.8*  MCV RESULTS UNAVAILABLE DUE TO INTERFERING SUBSTANCE 90.6 92.8 93.5 92.4 91.6 90.9  PLT 156 144* 117* 140* 132* 142* 138*    Cardiac Enzymes: No results for input(s): CKTOTAL, CKMB, CKMBINDEX, TROPONINI in the last 168 hours.  BNP: Invalid input(s): POCBNP  CBG: Recent Labs  Lab 04/22/24 1941 04/22/24 2330 04/23/24 0354 04/23/24 0720 04/23/24 1107  GLUCAP 107* 96 92 80 92    Microbiology: Results for orders placed or performed during the hospital encounter of 04/15/24  Blood culture (routine x 2)     Status: None  Collection Time: 04/15/24  8:55 PM   Specimen: BLOOD  Result Value Ref Range Status   Specimen Description BLOOD BLOOD RIGHT ARM  Final   Special Requests   Final    BOTTLES DRAWN AEROBIC AND ANAEROBIC Blood Culture adequate volume   Culture   Final    NO GROWTH 5 DAYS Performed at [facility], [address] [place], [place] [address]    Report Status 04/20/2024 FINAL  Final  Blood culture (routine x 2)     Status: None   Collection Time: 04/15/24  8:55 PM   Specimen: BLOOD  Result Value  Ref Range Status   Specimen Description BLOOD BLOOD LEFT ARM  Final   Special Requests   Final    BOTTLES DRAWN AEROBIC AND ANAEROBIC Blood Culture results may not be optimal due to an inadequate volume of blood received in culture bottles   Culture   Final    NO GROWTH 5 DAYS Performed at [facility], [address] [place], [place] [address]    Report Status 04/20/2024 FINAL  Final  MRSA Next Gen by PCR, Nasal     Status: None   Collection Time: 04/15/24 10:01 PM   Specimen: Nasal Mucosa; Nasal Swab  Result Value Ref Range Status   MRSA by PCR Next Gen NOT DETECTED NOT DETECTED Final    Comment: (NOTE) The GeneXpert MRSA Assay (FDA approved for NASAL specimens only), is one component of a comprehensive MRSA colonization surveillance program. It is not intended to diagnose MRSA infection nor to guide or monitor treatment for MRSA infections. Test performance is not FDA approved in patients less than 63 years old. Performed at [facility], [address] [place], [place] [address]   Culture, Respiratory w Gram Stain     Status: None   Collection Time: 04/16/24 11:42 AM   Specimen: SPU; Respiratory  Result Value Ref Range Status   Specimen Description   Final    SPUTUM Performed at [facility], [address], [place], [place] [address]    Special Requests   Final     EXPSU Performed at [facility], [address] [place], [place] [address]    Gram Stain   Final    ABUNDANT SQUAMOUS EPITHELIAL CELLS PRESENT MODERATE GRAM POSITIVE COCCI MODERATE GRAM NEGATIVE RODS    Culture   Final    Normal respiratory flora-no Staph aureus or Pseudomonas seen Performed at [facility], [address], [place], [place] [address]    Report Status 04/18/2024 FINAL  Final  Respiratory (~20 pathogens) panel by PCR     Status: None   Collection Time: 04/16/24 11:42 AM   Specimen: Expectorated Sputum; Respiratory  Result Value Ref Range  Status   Adenovirus NOT DETECTED NOT DETECTED Final   Coronavirus 229E NOT DETECTED NOT DETECTED Final    Comment: (NOTE) The Coronavirus on the Respiratory Panel, DOES NOT test for the novel  Coronavirus (2019 nCoV)    Coronavirus HKU1 NOT DETECTED NOT DETECTED Final   Coronavirus NL63 NOT DETECTED NOT DETECTED Final   Coronavirus OC43 NOT DETECTED NOT DETECTED Final   Metapneumovirus NOT DETECTED NOT DETECTED Final   Rhinovirus / Enterovirus NOT DETECTED NOT DETECTED Final   Influenza A NOT DETECTED NOT DETECTED Final   Influenza B NOT DETECTED NOT DETECTED Final   Parainfluenza Virus 1 NOT DETECTED NOT DETECTED Final   Parainfluenza Virus 2 NOT DETECTED NOT DETECTED Final   Parainfluenza  Virus 3 NOT DETECTED NOT DETECTED Final   Parainfluenza Virus 4 NOT DETECTED NOT DETECTED Final   Respiratory Syncytial Virus NOT DETECTED NOT DETECTED Final   Bordetella pertussis NOT DETECTED NOT DETECTED Final   Bordetella Parapertussis NOT DETECTED NOT DETECTED Final   Chlamydophila pneumoniae NOT DETECTED NOT DETECTED Final   Mycoplasma pneumoniae NOT DETECTED NOT DETECTED Final    Comment: Performed at [facility], [address], [place], [place] [address]    Coagulation Studies: Recent Labs    04/23/24 0310  LABPROT 14.5  INR 1.1     Urinalysis: No results for input(s): COLORURINE, LABSPEC, PHURINE, GLUCOSEU, HGBUR, BILIRUBINUR, KETONESUR, PROTEINUR, UROBILINOGEN, NITRITE, LEUKOCYTESUR in the last 72 hours.  Invalid input(s): APPERANCEUR    Imaging: No results found.    Medications:    clevidipine  2 mg/hr (04/23/24 1117)   dextrose  20 mL/hr at 04/23/24 1117   esmolol  Stopped (04/23/24 1007)    acetaminophen   650 mg Oral Q6H   amLODipine   10 mg Oral Daily   aspirin  EC  81 mg Oral Daily   Chlorhexidine  Gluconate Cloth  6 each Topical Daily   clopidogrel   75 mg Oral Daily   hydrALAZINE   50 mg Oral Q8H   metoprolol  tartrate  50 mg Oral  BID   polyethylene glycol  17 g Oral Daily   docusate sodium , hydrALAZINE , ondansetron  (ZOFRAN ) IV, mouth rinse  Assessment/ Plan:  78 y.o. male with past medical history of peripheral arterial disease, hypertension, abdominal aortic aneurysm, hyperlipidemia, bladder cancer, thrombocytopenia who was admitted with hypertensive crisis, abdominal aneurysm, endoleak of aortic graft.  1.  Acute kidney injury/chronic kidney disease stage IIIb -baseline creatinine 2.2 with EGFR 30 on 01/16/2024.  Acute kidney injury likely multifactorial with contribution from hypoperfusion from bilateral renal artery stenosis, and exposure to contrast with CT angio chest/pelvis/abdomen.   07/28 0701 - 07/29 0700 In: 956.7 [P.O.:240; I.V.:716.7] Out: 1200 [Urine:1200] Lab Results  Component Value Date   CREATININE 2.08 (H) 04/23/2024   CREATININE 2.23 (H) 04/22/2024   CREATININE 2.54 (H) 04/21/2024   Update: Renal function continues to improve.  Creatinine down to 2.08 with urine output 1.2 L.  Suspect further contrast exposure today.  We will need to monitor renal parameters closely.  2.  Hypertension.  Patient maintained on clevidipine  drip but currently off esmolol .  3.  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 Marea. Right renal artery stent placed.  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 Vascular surgery team is following along.    LOS: 8 Timmi Devora 7/29/20251:14 PM

## 2024-04-23 NOTE — Progress Notes (Signed)
 Esmolol  and cleviprex  gtt stopped per ROEL, MD and Lonell, NP. HR 50 and BP 120/78.

## 2024-04-23 NOTE — Plan of Care (Signed)
  Problem: Education: Goal: Knowledge of General Education information will improve Description: Including pain rating scale, medication(s)/side effects and non-pharmacologic comfort measures Outcome: Progressing   Problem: Clinical Measurements: Goal: Ability to maintain clinical measurements within normal limits will improve Outcome: Progressing Goal: Will remain free from infection Outcome: Progressing Goal: Diagnostic test results will improve Outcome: Progressing Goal: Respiratory complications will improve Outcome: Progressing Goal: Cardiovascular complication will be avoided Outcome: Progressing   Problem: Coping: Goal: Level of anxiety will decrease Outcome: Progressing   Problem: Elimination: Goal: Will not experience complications related to bowel motility Outcome: Progressing Goal: Will not experience complications related to urinary retention Outcome: Progressing   Problem: Pain Managment: Goal: General experience of comfort will improve and/or be controlled Outcome: Progressing   Problem: Safety: Goal: Ability to remain free from injury will improve Outcome: Progressing

## 2024-04-23 NOTE — Progress Notes (Signed)
 NAME:  Nathan Rivera, MRN:  [id number], DOB:  Dec 28, 1945, LOS: 8 ADMISSION DATE:  04/15/2024,  History of Present Illness:  78 y.o male with significant PMH of Infrarenal abdominal aortic aneurysm s/p endovascular repair of a ruptured AAA in 2011 on Xarelto , PAD, HTN, duodenal ulcer with perforation s/p omental patch repair, CKD stage III, thrombocytopenia, HDL, prior tobacco use, ESBL bacteremia, bladder cancer, chronic back pain, and bilateral inguinal hernia s/p repair who presented to the ED with chief complaints of abdominal pain and hemoptysis.   Patient and family at bedside reports that he took his last dose of Xarelto  yesterday evening.  He subsequently developed some abdominal pain around the epigastric and left upper quadrant region.  Since onset of symptoms has been coughing up bright red blood which he describes  blood filling up his mouth.  Denies other associated symptoms   ED Course: Initial vital signs showed HR of 68 beats/minute, BP 192/102 mm Hg, the RR 18 breaths/minute, and the oxygen saturation 93% on RA and a temperature of 98.46F (36.7C). Pertinent Labs/Diagnostics Findings: Na+/ K+:140/4.2 Glucose:94  BUN/Cr.:39/2.43 TAR:Lwmzfjmxjaoz  PCT: negative <0.10  BNP: 394.5  CTA Chest> CT Abd/pelvis> Saccular aneurysm at the level of the proximal stent with some soft tissue thickening and stranding concerning possible endoleak or impending rupture,Large aneurysm involving the left common iliac artery, partially encompasses the left iliac stent, Aneurysmal dilatation of the right common iliac artery at the distal end of the stent  Multiple small aneurysms of the proximal internal and iliac vessels.    CT findings discussed with on-call vascular Dr. Marea who recommended hemodynamic monitoring as it is unlikely patient will require emergent intervention but may require repair during the course of admission. PCCM consulted for admission, BP management and close  monitoring.  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 Aorto bifemoral endograft placement for AAA,  Chronic right-sided low back pain without sciatica;  Thrombocytopenia  Raynaud's disease with gangrene  B12 deficiency;  Personal history of malignant neoplasm of bladder;  Tobacco use disorder;  Hematuria, gross  ESBL Bacteremia due to septic shock due to obstructive stone with Pyelonephritis bilateral Inguinal Hernia s/p repair (02/24)  Significant Hospital Events: Including procedures, antibiotic start and stop dates in addition to other pertinent events   7/21: admit to ICU, on clevidipine  and esmolol  7/22: no recurrence of hemoptysis, but he continues to have a cough. EGD normal without any PUD or sign of GI bleed 7/23: worsening renal function, holding on surgical intervention 7/24: kidney function stable, ileus, N/V in the AM. Sat in chair 7/25: slept well overnight, kidney function improved. No complaints. Underwent renal artery stenting. 7/26: passing flatus, no abdominal pain 7/27: delirium, no pain reported but he is confused today 07/28: Patient complaining of new back pain, appears to be muscular. Remains on clevidipine  and emolol drip for strict bp control.   Interim History / Subjective:  Awake and alert without any acute distress.  HD stable.  H&H stable.  Weaning off esmolol  and Cleviprex  drip nicely.   Objective    Blood pressure (!) 141/86, pulse (!) 58, temperature 98.5 F (36.9 C), temperature source Oral, resp. rate 16, height 5' 9 (1.753 m), weight 99.6 kg, SpO2 98%.        Intake/Output Summary (Last 24 hours) at 04/23/2024 1152 Last  data filed at 04/23/2024 1117 Gross per 24 hour  Intake 827.55 ml  Output 1100 ml  Net -272.45 ml   Filed  Weights   04/21/24 0413 04/22/24 0500 04/23/24 0704  Weight: 99.4 kg 99.6 kg 99.6 kg    Examination: General: NAD HENT: Supple neck, reactive pupils Lungs: Clear bilateral air entry Cardiovascular: Normal S1, Normal S2, RRR Abdomen: Soft, non tender, non distended, +BS  Extremities: Warm and well perfused no edema.   Labs and imaging were reviewed.   Assessment and Plan  #Hemoptysis #Endoleak with Aneurysm Expansion #History of Infrarenal AAA s/p endovascular repeair s/p R renal artery stenting 07/25 with improvement in AKI. Plan for L renal artery intervention on 07/30.  #HTN emergency #AKI on CKD improving baseline Cr. 2.2 mg/dl.  #Community Acquired Pneumonia #Mild Cognitive Impairment   78 year old male presenting with an episode of hemoptysis (golf ball sized) with concern for hematemesis given history of GI bleed in the past. Patient was found to have a possible endoleak with threat of rupture and is admitted to the ICU for tight blood pressure control. He is s/p renal artery stenting.   Neuro - history of mild cognitive impairment, holding memantine and donepezil  given bradycardia. Continues to have delirium. CV - history of hypertension, and presented with elevated blood pressure. Started on strict HR and BP control given concern for endovascular leak and risk of AAA rupture. He does have significant vascular disease (bilateral severe renal artery stenosis, severe celiac artery stenosis, infrarenal abdominal aortoiliac stent graft, aneurysmal dilation of the abdominal aorta at the level of the renal arteries with a saccular component concerning for leakage vs impending rupture, left common iliac artery aneurysm). Given renal disease and need for contrast, endoleak repair is delayed, and he underwent renal artery stenosis stenting. Continue with goal SBP of 130-140 mmHg and HR < 60.             -titrate clevidipine  for goal SBP of 130-140             -titrate esmolol  for goal HR <  60  - Metoprolol  tartrate 50mg  BID, Amlodipine  10mg  Daily and Hydralazine  50mg  Q8H scheduled. Weaning down drips as possible.  NPO after midnight.  Pulm - No recurrence of his hemoptysis since his admission. Chest CT with multiple tree in bud nodules as well as minimal ground glass opacities that were scattered, concerning for viral infection versus bacterial pneumonia/bronchitis. He remains on nasal cannula and oxygenation is improved with holding of fluids, mobilization, and incentive spirometry. GI - Underwent EGD that did not show any PUD or sign of GI bleed. Discontinued high dose PPI. CT abdomen with signs of ileus. Tried NG tube but did not tolerate. Bowel sounds are improved, and he has passed flatus. Trial clear liquid diet and ok to advance diet as tolerated. Continue with OOB to chair. NPO after midnight.  Renal - AKI on CKD, with bilateral renal artery stenosis. This was likely exacerbated by contrast exposure. Holding IV fluids and liberalizing BP requirements to allow for kidney perfusion (see CV above). Kidney function improved today.  Endo - ICU glycemic protocol Hem/Onc - on home rivaroxaban , holding given hemoptysis. H/H stable ID - Viral panel negative, on Ceftriaxone  and Azithromycin  for CAP coverage completed course. Sputum culture no growth.   Best Practice (right click and Reselect all SmartList Selections daily)   Diet/type: Regular consistency (see orders) DVT prophylaxis not indicated Pressure ulcer(s): N/A GI prophylaxis: N/A Lines: N/A Foley:  N/A  Code Status:  full code  Last date of multidisciplinary goals of care discussion [04/22/2024`]  Critical care time: 35 minutes    Darrin Barn, MD  Pulmonary Critical Care 04/23/2024 11:52 AM

## 2024-04-23 NOTE — Progress Notes (Signed)
 Cleviprex  restarted per Dana, NP for consistent SBP in the 150s.

## 2024-04-23 NOTE — Progress Notes (Signed)
 PT Cancellation Note  Patient Details Name: GRACE HAGGART MRN: [id number] DOB: 12-07-1945   Cancelled Treatment:     RR called this am due to unstable vitals, pt awaiting AAA repair planned for tomorrow, Wednesday 04/24/24. Will hold PT session and await new clearance orders to continue PT.  Addendum: Miscommunication from yesterday's attempt, no Rapid Response called, PT services to continue per POC.   Darice JAYSON Bohr 04/23/2024, 12:54 PM

## 2024-04-23 NOTE — Progress Notes (Signed)
 Progress Note    04/23/2024 11:47 AM 4 Days Post-Op  Subjective:  Nathan Rivera is a 78 yo male now POD #3 From:   Procedure(s) Performed:             1.  Ultrasound guidance for vascular access right femoral artery             2.  Catheter placement into right renal artery from right femoral approach             3.  Aortogram and selective right renal angiogram             4.  Balloon expandable stent placement to the right renal artery with a 7 mm diameter x 26 mm length Lifestream stent             5.  StarClose closure device right femoral artery   The patient is a 78 year old male with severe hypertension and ischemic nephropathy with a previous CT scan suggesting severe bilateral renal artery stenosis with worsening severe hypertension despite multiple home medications. The patient has suboptimal blood pressure control despite multiple antihypertensives and a noninvasive study demonstrating hemodynamically significant bilateral renal artery stenosis.  He was admitted to the hospital with a hypertensive crisis and also found to have a large type Ib endoleak with his previously repaired abdominal aortic aneurysm as given the clinical scenario and the noninvasive findings, angiogram is indicated for further evaluation of her renal artery and potential treatment.    Vitals:   04/23/24 1115 04/23/24 1130  BP: (!) 147/79 (!) 144/83  Pulse: (!) 58   Resp: 18 16  Temp:    SpO2: 98%    Physical Exam: Cardiac:  Regular Rate but bradycardic at 40 bpm's. No murmurs appreciated.  Lungs:  Non labored clear throughout on auscultation.  No rales rhonchi or wheezing. Incisions: Right groin without complication, dressing clean dry and intact.  Extremities: All extremities with palpable pulses and warm to touch. Abdomen: Positive bowel sounds throughout, soft, nontender and nondistended Neurologic: Alert and oriented x 3, answers all questions and follows commands appropriately. CBC     Component Value Date/Time   WBC 8.1 04/23/2024 0311   RBC 4.05 (L) 04/23/2024 0311   HGB 12.2 (L) 04/23/2024 0311   HGB 10.3 (L) 08/18/2013 0504   HCT 36.8 (L) 04/23/2024 0311   HCT 30.9 (L) 08/18/2013 0504   PLT 138 (L) 04/23/2024 0311   PLT 161 08/18/2013 0504   MCV 90.9 04/23/2024 0311   MCV 86 08/18/2013 0504   MCH 30.1 04/23/2024 0311   MCHC 33.2 04/23/2024 0311   RDW 15.3 04/23/2024 0311   RDW 16.0 (H) 08/18/2013 0504   LYMPHSABS 1.3 04/23/2024 0311   LYMPHSABS 2.3 08/18/2013 0504   MONOABS 0.8 04/23/2024 0311   MONOABS 1.1 (H) 08/18/2013 0504   EOSABS 0.5 04/23/2024 0311   EOSABS 0.2 08/18/2013 0504   BASOSABS 0.0 04/23/2024 0311   BASOSABS 0.0 08/18/2013 0504    BMET    Component Value Date/Time   NA 142 04/23/2024 0311   NA 144 10/23/2014 1630   K 3.8 04/23/2024 0311   K 4.1 10/23/2014 1630   CL 113 (H) 04/23/2024 0311   CL 112 (H) 10/23/2014 1630   CO2 22 04/23/2024 0311   CO2 26 10/23/2014 1630   GLUCOSE 85 04/23/2024 0311   GLUCOSE 83 10/23/2014 1630   BUN 38 (H) 04/23/2024 0311   BUN 30 (H) 10/23/2014 1630   CREATININE 2.08 (H) 04/23/2024  9688   CREATININE 1.96 (H) 10/23/2014 1630   CALCIUM 8.4 (L) 04/23/2024 0311   CALCIUM 9.0 10/23/2014 1630   GFRNONAA 32 (L) 04/23/2024 0311   GFRNONAA 36 (L) 10/23/2014 1630   GFRNONAA 8 (L) 08/18/2013 0504   GFRAA 39 (L) 10/25/2014 0352   GFRAA 44 (L) 10/23/2014 1630   GFRAA 9 (L) 08/18/2013 0504    INR    Component Value Date/Time   INR 1.1 04/23/2024 0310   INR 1.5 10/20/2014 0833     Intake/Output Summary (Last 24 hours) at 04/23/2024 1147 Last data filed at 04/23/2024 1117 Gross per 24 hour  Intake 827.55 ml  Output 1100 ml  Net -272.45 ml     Assessment/Plan:  78 y.o. male is s/p SEE ABOVE 4 Days Post-Op   PLAN Patients kidney status continue to improve.  Keep Blood Pressure Systolic at 130-140. OOB to chair and ambulate in halls AAA repair planned for Wednesday 04/24/24 in the vascular  Lab. Will continue to monitor patients kidney status. I discussed again this morning in detail with the patient, his wife and daughter the procedure, benefits, risks and complication. They all wish to proceed. Patient will be made NPO after midnight tonight.   Continue ASA 81 MG Daily and Plavix  75 mg Daily.    DVT prophylaxis:  AS 81 mg and Plavix  75 mg    Nira Visscher R Quanta [facility] 04/23/2024 11:47 AM

## 2024-04-23 NOTE — Progress Notes (Signed)
 OT Cancellation Note  Patient Details Name: [name] MRN: [id number] DOB: [birth year]   Cancelled Treatment:    Reason Eval/Treat Not Completed: Medical issues which prohibited therapy. RR called this am due to unstable vitals, pt awaiting AAA repair planned for tomorrow, Wednesday 04/24/24. Will hold OT session and await new orders to continue OT.   Larraine Colas M.S. OTR/L  04/23/24, 1:15 PM

## 2024-04-23 NOTE — Progress Notes (Signed)
 Notified Lonell, NP that SBP has been anywhere from 115-150 and pt has been on and off cleviprex  throughout day attempting to reach goal of SBP 130-140. Pt's HR has maintained in the low 40's most of the day. Pt will be NPO after MN and unable to take PO BP meds. Will attempt to maintain SBP goal with infusions per Dana, NP.

## 2024-04-24 ENCOUNTER — Encounter
Admission: EM | Disposition: A | Discharge status: Home Health | Payer: Self-pay | Source: Home / Self Care | Attending: Student in an Organized Health Care Education/Training Program

## 2024-04-24 DIAGNOSIS — T82310A Breakdown (mechanical) of aortic (bifurcation) graft (replacement), initial encounter: Secondary | ICD-10-CM | POA: Diagnosis not present

## 2024-04-24 DIAGNOSIS — I701 Atherosclerosis of renal artery: Secondary | ICD-10-CM | POA: Diagnosis not present

## 2024-04-24 DIAGNOSIS — T82330A Leakage of aortic (bifurcation) graft (replacement), initial encounter: Secondary | ICD-10-CM | POA: Diagnosis not present

## 2024-04-24 DIAGNOSIS — R042 Hemoptysis: Secondary | ICD-10-CM | POA: Diagnosis not present

## 2024-04-24 DIAGNOSIS — I161 Hypertensive emergency: Secondary | ICD-10-CM | POA: Diagnosis not present

## 2024-04-24 DIAGNOSIS — N189 Chronic kidney disease, unspecified: Secondary | ICD-10-CM | POA: Diagnosis not present

## 2024-04-24 DIAGNOSIS — N179 Acute kidney failure, unspecified: Secondary | ICD-10-CM | POA: Diagnosis not present

## 2024-04-24 LAB — BASIC METABOLIC PANEL WITH GFR
Anion gap: 9 (ref 5–15)
BUN: 36 mg/dL — ABNORMAL HIGH (ref 8–23)
CO2: 20 mmol/L — ABNORMAL LOW (ref 22–32)
Calcium: 8.4 mg/dL — ABNORMAL LOW (ref 8.9–10.3)
Chloride: 110 mmol/L (ref 98–111)
Creatinine, Ser: 2.15 mg/dL — ABNORMAL HIGH (ref 0.61–1.24)
GFR, Estimated: 31 mL/min — ABNORMAL LOW (ref >60)
Glucose, Bld: 91 mg/dL (ref 70–99)
Potassium: 3.6 mmol/L (ref 3.5–5.1)
Sodium: 139 mmol/L (ref 135–145)

## 2024-04-24 LAB — CBC WITH DIFFERENTIAL/PLATELET
Abs Immature Granulocytes: 0.07 K/uL (ref 0.00–0.07)
Basophils Absolute: 0 K/uL (ref 0.0–0.1)
Basophils Relative: 1 %
Eosinophils Absolute: 0.6 K/uL — ABNORMAL HIGH (ref 0.0–0.5)
Eosinophils Relative: 6 %
HCT: 37.3 % — ABNORMAL LOW (ref 39.0–52.0)
Hemoglobin: 12.2 g/dL — ABNORMAL LOW (ref 13.0–17.0)
Immature Granulocytes: 1 %
Lymphocytes Relative: 17 %
Lymphs Abs: 1.5 K/uL (ref 0.7–4.0)
MCH: 29.8 pg (ref 26.0–34.0)
MCHC: 32.7 g/dL (ref 30.0–36.0)
MCV: 91.2 fL (ref 80.0–100.0)
Monocytes Absolute: 0.8 K/uL (ref 0.1–1.0)
Monocytes Relative: 9 %
Neutro Abs: 5.9 K/uL (ref 1.7–7.7)
Neutrophils Relative %: 66 %
Platelets: 144 K/uL — ABNORMAL LOW (ref 150–400)
RBC: 4.09 MIL/uL — ABNORMAL LOW (ref 4.22–5.81)
RDW: 15.3 % (ref 11.5–15.5)
WBC: 8.8 K/uL (ref 4.0–10.5)
nRBC: 0 % (ref 0.0–0.2)

## 2024-04-24 LAB — GLUCOSE, CAPILLARY
Glucose-Capillary: 83 mg/dL (ref 70–99)
Glucose-Capillary: 84 mg/dL (ref 70–99)
Glucose-Capillary: 101 mg/dL — ABNORMAL HIGH (ref 70–99)
Glucose-Capillary: 104 mg/dL — ABNORMAL HIGH (ref 70–99)
Glucose-Capillary: 99 mg/dL (ref 70–99)
Glucose-Capillary: 90 mg/dL (ref 70–99)

## 2024-04-24 LAB — PHOSPHORUS: Phosphorus: 3 mg/dL (ref 2.5–4.6)

## 2024-04-24 LAB — MAGNESIUM: Magnesium: 2.2 mg/dL (ref 1.7–2.4)

## 2024-04-24 SURGERY — AORTIC INTERVENTION
Anesthesia: Moderate Sedation

## 2024-04-24 MED ORDER — CLEVIDIPINE BUTYRATE 0.5 MG/ML IV EMUL
0.0000 mg/h | INTRAVENOUS | Status: DC
  Administered 2024-04-24: 2 mg/h via INTRAVENOUS
  Administered 2024-04-24: 4 mg/h via INTRAVENOUS
  Administered 2024-04-25: 9 mg/h via INTRAVENOUS
  Administered 2024-04-25 (×2): 5 mg/h via INTRAVENOUS
  Administered 2024-04-25: 9 mg/h via INTRAVENOUS
  Administered 2024-04-25: 5 mg/h via INTRAVENOUS
  Administered 2024-04-25: 7 mg/h via INTRAVENOUS
  Administered 2024-04-25: 5 mg/h via INTRAVENOUS
  Administered 2024-04-26 (×2): 9 mg/h via INTRAVENOUS
  Filled 2024-04-24 (×12): qty 50

## 2024-04-24 MED ORDER — ACETAMINOPHEN 325 MG PO TABS
650.0000 mg | ORAL_TABLET | Freq: Four times a day (QID) | ORAL | Status: DC | PRN
  Administered 2024-04-25 – 2024-04-27 (×2): 650 mg via ORAL
  Filled 2024-04-24 (×2): qty 2

## 2024-04-24 MED ORDER — POTASSIUM CHLORIDE CRYS ER 20 MEQ PO TBCR
40.0000 meq | EXTENDED_RELEASE_TABLET | Freq: Once | ORAL | Status: AC
  Administered 2024-04-24: 40 meq via ORAL
  Filled 2024-04-24: qty 2

## 2024-04-24 MED ORDER — HYDRALAZINE HCL 50 MG PO TABS
50.0000 mg | ORAL_TABLET | Freq: Four times a day (QID) | ORAL | Status: DC
  Administered 2024-04-24 – 2024-04-28 (×15): 50 mg via ORAL
  Filled 2024-04-24 (×14): qty 1

## 2024-04-24 NOTE — Progress Notes (Signed)
 Occupational Therapy Treatment Patient Details Name: Nathan Rivera MRN: [id number] DOB: 05-Nov-1945 Today's Date: 04/24/2024   History of present illness Pt is a 78 y.o male with PMH that includes: Infrarenal abdominal aortic aneurysm s/p endovascular repair of a ruptured AAA in 2011 on Xarelto , PAD, HTN, duodenal ulcer with perforation s/p omental patch repair, CKD stage III, thrombocytopenia, HDL, prior tobacco use, ESBL bacteremia, bladder cancer, chronic back pain, and bilateral inguinal hernia s/p repair who presented to the ED with chief complaints of abdominal pain and hemoptysis.  MD assessment includes: hemoptysis, endoleak with aneurysm expansion, HTN emergency, AKI on CKD, CAP, and mild cognitive impairment.   OT comments  Chart reviewed to date, pt greeted semi supine in bed with wife present, oriented to self, agreeable to OT tx session targeting improving functional activity tolerance for improved ADL performance. Pt is making progress towards goals, he does require frequent multi modal cues for initiation/sequencing at times. Pt amb in the hallway with RW with +1-2 (for lines/leads) with CGA-MIN A with frequent multi modal cues for technique. Pt performed grooming tasks with MIN A, LB dressing with MAX A. Pt/wife are in agreement for continued mobility attempts to decrease progressive weakness while admitted as their goal is to discharge home if possible. Pt is left in bedside chair, all needs met, IV, purewik, leads intact pre/post session. OT will continue to follow.       If plan is discharge home, recommend the following:  A little help with walking and/or transfers;A little help with bathing/dressing/bathroom;Direct supervision/assist for medications management;Direct supervision/assist for financial management;Assist for transportation;Supervision due to cognitive status;Assistance with cooking/housework   Equipment Recommendations  Other (comment) (pt has recommended equipment at  this time)    Recommendations for Other Services      Precautions / Restrictions Precautions Precautions: Fall Recall of Precautions/Restrictions: Impaired Restrictions Weight Bearing Restrictions Per Provider Order: No       Mobility Bed Mobility Overal bed mobility: Needs Assistance Bed Mobility: Supine to Sit     Supine to sit: Min assist, HOB elevated          Transfers Overall transfer level: Needs assistance Equipment used: Rolling walker (2 wheels) Transfers: Sit to/from Stand Sit to Stand: Contact guard assist           General transfer comment: frequent multi modal cues for technique     Balance Overall balance assessment: Needs assistance (Simultaneous filing. User may not have seen previous data.) Sitting-balance support: Bilateral upper extremity supported, Feet supported (Simultaneous filing. User may not have seen previous data.) Sitting balance-Leahy Scale: Good (Simultaneous filing. User may not have seen previous data.)     Standing balance support: Bilateral upper extremity supported, During functional activity, Reliant on assistive device for balance (Simultaneous filing. User may not have seen previous data.) Standing balance-Leahy Scale: Fair (Simultaneous filing. User may not have seen previous data.)                             ADL either performed or assessed with clinical judgement   ADL Overall ADL's : Needs assistance/impaired Eating/Feeding: Supervision/ safety;Sitting   Grooming: Minimal assistance Grooming Details (indicate cue type and reason): to wash face for thoroughness             Lower Body Dressing: Maximal assistance   Toilet Transfer: Ambulation;Rolling walker (2 wheels);Contact guard assist;Minimal assistance Toilet Transfer Details (indicate cue type and reason): frequent multi modal cues  throughout for technique/sequencing         Functional mobility during ADLs: Contact guard assist;Minimal  assistance;Rolling walker (2 wheels);Cueing for safety;Cueing for sequencing      Extremity/Trunk Assessment              Vision       Perception     Praxis     Communication Communication Communication: No apparent difficulties Factors Affecting Communication: Difficulty expressing self   Cognition Arousal: Alert Behavior During Therapy: WFL for tasks assessed/performed Cognition: Cognition impaired   Orientation impairments: Time, Situation, Place   Memory impairment (select all impairments): Short-term memory, Declarative long-term memory, Non-declarative long-term memory, Working Biochemist, clinical functioning impairment (select all impairments): Reasoning, Problem solving, Sequencing, Initiation, Organization                   Following commands: Impaired Following commands impaired: Follows one step commands inconsistently, Follows one step commands with increased time (improved with multi modal cueing)      Cueing   Cueing Techniques: Verbal cues, Gestural cues  Exercises Other Exercises Other Exercises: edu pt/wife  re role of OT, role of rehab, discharge recommendations, importance of progressing mobility    Shoulder Instructions       General Comments after amb spo2 88% on RA, >90% on RA at rest; HR in 60s bpm throughout    Pertinent Vitals/ Pain       Pain Assessment Pain Assessment: Faces Faces Pain Scale: Hurts a little bit Pain Location: L hip Pain Descriptors / Indicators: Discomfort, Sore Pain Intervention(s): Monitored during session, Repositioned  Home Living                                          Prior Functioning/Environment              Frequency  Min 3X/week        Progress Toward Goals  OT Goals(current goals can now be found in the care plan section)  Progress towards OT goals: Progressing toward goals  Acute Rehab OT Goals Time For Goal Achievement: 05/06/24  Plan       Co-evaluation    PT/OT/SLP Co-Evaluation/Treatment: Yes Reason for Co-Treatment: For patient/therapist safety;To address functional/ADL transfers PT goals addressed during session: Mobility/safety with mobility;Balance;Proper use of DME OT goals addressed during session: ADL's and self-care      AM-PAC OT 6 Clicks Daily Activity     Outcome Measure   Help from another person eating meals?: None Help from another person taking care of personal grooming?: A Little Help from another person toileting, which includes using toliet, bedpan, or urinal?: A Little Help from another person bathing (including washing, rinsing, drying)?: A Little Help from another person to put on and taking off regular upper body clothing?: A Little Help from another person to put on and taking off regular lower body clothing?: A Lot 6 Click Score: 18    End of Session Equipment Utilized During Treatment: Gait belt;Rolling walker (2 wheels)  OT Visit Diagnosis: Unsteadiness on feet (R26.81);Other abnormalities of gait and mobility (R26.89);Repeated falls (R29.6);Muscle weakness (generalized) (M62.81);Other symptoms and signs involving cognitive function   Activity Tolerance Patient tolerated treatment well   Patient Left in chair;with call bell/phone within reach   Nurse Communication Mobility status        Time: 9141-9076 OT Time Calculation (min): 25 min  Charges: OT General Charges $OT Visit: 1 Visit OT Treatments $Therapeutic Activity: 8-22 mins  Therisa Sheffield, OTD OTR/L  04/24/24, 10:01 AM

## 2024-04-24 NOTE — TOC Progression Note (Signed)
 Transition of Care [facility]) - Progression Note    Patient Details  Name: Nathan Rivera MRN: [id number] Date of Birth: 02/23/1946  Transition of [facility]) CM/SW Contact  K'La JINNY Ruts, LCSW Phone Number: 04/24/2024, 1:42 PM  Clinical Narrative:    Chart Reviewed. I spoke with the patient wife and she reports that she would like to wait to choose SNF after the patient has surgery on Friday.    Expected Discharge Plan: Home w Home Health Services                 Expected Discharge Plan and Services       Living arrangements for the past 2 months: Single Family Home                                       Social Drivers of Health (SDOH) Interventions SDOH Screenings   Food Insecurity: No Food Insecurity (04/16/2024)  Housing: Low Risk  (04/16/2024)  Transportation Needs: No Transportation Needs (04/16/2024)  Utilities: Not At Risk (04/16/2024)  Financial Resource Strain: Low Risk  (07/19/2023)   Received from [facility]  Social Connections: Unknown (04/17/2024)  Tobacco Use: High Risk (04/19/2024)    Readmission Risk Interventions     No data to display

## 2024-04-24 NOTE — Consult Note (Signed)
 PHARMACY CONSULT NOTE - ELECTROLYTES  Pharmacy Consult for Electrolyte Monitoring and Replacement   Recent Labs: Height: 5' 9 (175.3 cm) Weight: 97.4 kg (214 lb 11.7 oz) IBW/kg (Calculated) : 70.7 Estimated Creatinine Clearance: 32.6 mL/min (A) (by C-G formula based on SCr of 2.15 mg/dL (H)). Potassium (mmol/L)  Date Value  04/24/2024 3.6  10/23/2014 4.1   Magnesium (mg/dL)  Date Value  92/69/7974 2.2   Calcium (mg/dL)  Date Value  92/69/7974 8.4 (L)   Calcium, Total (mg/dL)  Date Value  98/71/7983 9.0   Albumin  (g/dL)  Date Value  98/74/7975 4.1  08/18/2013 1.4 (L)   Phosphorus (mg/dL)  Date Value  92/69/7974 3.0   Sodium (mmol/L)  Date Value  04/24/2024 139  10/23/2014 144   Assessment  Nathan Rivera is a 78 y.o. male presenting with abdominal pain and hemoptysis. PMH significant for Infrarenal abdominal aortic aneurysm s/p endovascular repair of a ruptured AAA in 2011 on Xarelto , PAD, HTN, duodenal ulcer with perforation s/p omental patch repair, CKD stage III, thrombocytopenia, HDL, prior tobacco use, ESBL bacteremia, bladder cancer, chronic back pain, and bilateral inguinal hernia s/p repair . Pharmacy has been consulted to monitor and replace electrolytes.  MIVF: D10w at 20 cc/hr Pertinent medications: clevidipine  gtt, esmolol  gtt  Goal of Therapy: Electrolytes WNL  Plan:  K 3.6, Kcl 40 mEq PO x 1 dose Check BMP tomorrow  Thank you for allowing pharmacy to be a part of this patient's care.  Marolyn KATHEE Mare 04/24/2024 8:13 AM

## 2024-04-24 NOTE — Progress Notes (Signed)
 NAME:  Nathan Rivera, MRN:  [id number], DOB:  1946-09-14, LOS: 9 ADMISSION DATE:  04/15/2024,  History of Present Illness:  78 y.o male with significant PMH of Infrarenal abdominal aortic aneurysm s/p endovascular repair of a ruptured AAA in 2011 on Xarelto , PAD, HTN, duodenal ulcer with perforation s/p omental patch repair, CKD stage III, thrombocytopenia, HDL, prior tobacco use, ESBL bacteremia, bladder cancer, chronic back pain, and bilateral inguinal hernia s/p repair who presented to the ED with chief complaints of abdominal pain and hemoptysis.   Patient and family at bedside reports that he took his last dose of Xarelto  yesterday evening.  He subsequently developed some abdominal pain around the epigastric and left upper quadrant region.  Since onset of symptoms has been coughing up bright red blood which he describes  blood filling up his mouth.  Denies other associated symptoms   ED Course: Initial vital signs showed HR of 68 beats/minute, BP 192/102 mm Hg, the RR 18 breaths/minute, and the oxygen saturation 93% on RA and a temperature of 98.7F (36.7C). Pertinent Labs/Diagnostics Findings: Na+/ K+:140/4.2 Glucose:94  BUN/Cr.:39/2.43 TAR:Lwmzfjmxjaoz  PCT: negative <0.10  BNP: 394.5  CTA Chest> CT Abd/pelvis> Saccular aneurysm at the level of the proximal stent with some soft tissue thickening and stranding concerning possible endoleak or impending rupture,Large aneurysm involving the left common iliac artery, partially encompasses the left iliac stent, Aneurysmal dilatation of the right common iliac artery at the distal end of the stent  Multiple small aneurysms of the proximal internal and iliac vessels.    CT findings discussed with on-call vascular Dr. Marea who recommended hemodynamic monitoring as it is unlikely patient will require emergent intervention but may require repair during the course of admission. PCCM consulted for admission, BP management and close  monitoring.  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 Aorto bifemoral endograft placement for AAA,  Chronic right-sided low back pain without sciatica;  Thrombocytopenia  Raynaud's disease with gangrene  B12 deficiency;  Personal history of malignant neoplasm of bladder;  Tobacco use disorder;  Hematuria, gross  ESBL Bacteremia due to septic shock due to obstructive stone with Pyelonephritis bilateral Inguinal Hernia s/p repair (02/24)  Significant Hospital Events: Including procedures, antibiotic start and stop dates in addition to other pertinent events   7/21: admit to ICU, on clevidipine  and esmolol  7/22: no recurrence of hemoptysis, but he continues to have a cough. EGD normal without any PUD or sign of GI bleed 7/23: worsening renal function, holding on surgical intervention 7/24: kidney function stable, ileus, N/V in the AM. Sat in chair 7/25: slept well overnight, kidney function improved. No complaints. Underwent renal artery stenting. 7/26: passing flatus, no abdominal pain 7/27: delirium, no pain reported but he is confused today 07/28: Patient complaining of new back pain, appears to be muscular. Remains on clevidipine  and emolol drip for strict bp control.   Interim History / Subjective:  [name] is doing well and has no major complaints.  Appears his intervention is again postponed for Friday.  Remains on cleveprex drip with hopes to come off today.   Objective    Blood pressure (!) 140/75, pulse (!) 43, temperature (!) 97.4 F (36.3 C), temperature source Oral, resp. rate 15, height 5' 9 (1.753 m), weight 97.4 kg, SpO2 92%.  Intake/Output Summary (Last 24 hours) at 04/24/2024 1307 Last data filed at 04/24/2024 1200 Gross per 24 hour  Intake  803.36 ml  Output 1050 ml  Net -246.64 ml   Filed Weights   04/22/24 0500 04/23/24 0704 04/24/24 0428  Weight: 99.6 kg 99.6 kg 97.4 kg    Examination: General: NAD HENT: Supple neck, reactive pupils Lungs: Clear bilateral air entry Cardiovascular: Normal S1, Normal S2, RRR Abdomen: Soft, non tender, non distended, +BS  Extremities: Warm and well perfused no edema.   Labs and imaging were reviewed.   Assessment and Plan  #Hemoptysis #Endoleak with Aneurysm Expansion #History of Infrarenal AAA s/p endovascular repeair s/p R renal artery stenting 07/25 with improvement in AKI. Plan for L renal artery intervention on 07/30.  #HTN emergency #AKI on CKD improving baseline Cr. 2.2 mg/dl.  #Community Acquired Pneumonia #Mild Cognitive Impairment   78 year old male presenting with an episode of hemoptysis (golf ball sized) with concern for hematemesis given history of GI bleed in the past. Patient was found to have a possible endoleak with threat of rupture and is admitted to the ICU for tight blood pressure control. He is s/p renal artery stenting.   Neuro - history of mild cognitive impairment, holding memantine and donepezil  given bradycardia. Continues to have delirium. CV - history of hypertension, and presented with elevated blood pressure. Started on strict HR and BP control given concern for endovascular leak and risk of AAA rupture. He does have significant vascular disease (bilateral severe renal artery stenosis, severe celiac artery stenosis, infrarenal abdominal aortoiliac stent graft, aneurysmal dilation of the abdominal aorta at the level of the renal arteries with a saccular component concerning for leakage vs impending rupture, left common iliac artery aneurysm). Given renal disease and need for contrast, endoleak repair is delayed, and he underwent renal artery stenosis stenting. Continue with goal SBP of 130-140 mmHg and HR < 60.             -titrate clevidipine  for goal SBP  of 130-140             -titrate esmolol  for goal HR < 60  - Metoprolol  tartrate 25mg  BID, Amlodipine  10mg  Daily and Hydralazine  50mg  Q6H scheduled. Weaning down drips as possible.  NPO after midnight Thursday.  Pulm - No recurrence of his hemoptysis since his admission. Chest CT with multiple tree in bud nodules as well as minimal ground glass opacities that were scattered, concerning for viral infection versus bacterial pneumonia/bronchitis. He remains on nasal cannula and oxygenation is improved with holding of fluids, mobilization, and incentive spirometry. GI - Underwent EGD that did not show any PUD or sign of GI bleed. Discontinued high dose PPI. CT abdomen with signs of ileus. Tried NG tube but did not tolerate. Bowel sounds are improved, and he has passed flatus. Trial clear liquid diet and ok to advance diet as tolerated. Continue with OOB to chair. NPO after midnight.  Renal - AKI on CKD, with bilateral renal artery stenosis. This was likely exacerbated by contrast exposure. Holding IV fluids and liberalizing BP requirements to allow for kidney perfusion (see CV above). Kidney function improved today.  Endo - ICU glycemic protocol Hem/Onc - on home rivaroxaban , holding given hemoptysis. H/H stable We need to decide on reinitiating in the am.  ID - Viral panel negative, on Ceftriaxone  and Azithromycin  for CAP coverage completed course. Sputum culture no growth.   Best Practice (right click and Reselect all SmartList Selections daily)  Diet/type: Regular consistency (see orders) DVT prophylaxis not indicated Pressure ulcer(s): N/A GI prophylaxis: N/A Lines: N/A Foley:  N/A Code Status:  full code  Last date of multidisciplinary goals of care discussion [04/22/2024`]  Critical care time: 35 minutes    Darrin Barn, MD [facility] 04/24/2024 1:07 PM

## 2024-04-24 NOTE — Progress Notes (Signed)
 Physical Therapy Treatment Patient Details Name: Nathan Rivera MRN: [id number] DOB: December 11, 1945 Today's Date: 04/24/2024   History of Present Illness Pt is a 78 y.o male with PMH that includes: Infrarenal abdominal aortic aneurysm s/p endovascular repair of a ruptured AAA in 2011 on Xarelto , PAD, HTN, duodenal ulcer with perforation s/p omental patch repair, CKD stage III, thrombocytopenia, HDL, prior tobacco use, ESBL bacteremia, bladder cancer, chronic back pain, and bilateral inguinal hernia s/p repair who presented to the ED with chief complaints of abdominal pain and hemoptysis.  MD assessment includes: hemoptysis, endoleak with aneurysm expansion, HTN emergency, AKI on CKD, CAP, and mild cognitive impairment.    PT Comments  Patient received in bed, wife at bedside. Patient is agreeable to PT/OT sessions. Patient requires min A for bed mobility and transfers. Cga to min A for ambulation with RW ~ 60 feet. Patient requires cues for RW negotiation and safety. He will continue to benefit from skilled PT to improve safety, endurance, strength and independence.       If plan is discharge home, recommend the following: A little help with walking and/or transfers;A little help with bathing/dressing/bathroom;Assist for transportation;Help with stairs or ramp for entrance   Can travel by private vehicle     Yes  Equipment Recommendations  None recommended by PT    Recommendations for Other Services       Precautions / Restrictions Precautions Precautions: Fall Recall of Precautions/Restrictions: Impaired Restrictions Weight Bearing Restrictions Per Provider Order: No     Mobility  Bed Mobility Overal bed mobility: Needs Assistance Bed Mobility: Supine to Sit     Supine to sit: Min assist, HOB elevated, Used rails, +2 for physical assistance     General bed mobility comments: Verbal cues for sequencing, safety    Transfers Overall transfer level: Needs assistance Equipment  used: Rolling walker (2 wheels) Transfers: Sit to/from Stand Sit to Stand: Contact guard assist           General transfer comment: Cues for hand placement    Ambulation/Gait Ambulation/Gait assistance: Contact guard assist, Min assist, +2 safety/equipment Gait Distance (Feet): 60 Feet Assistive device: Rolling walker (2 wheels) Gait Pattern/deviations: Step-through pattern, Trunk flexed, Decreased stride length, Decreased step length - right, Decreased step length - left Gait velocity: decreased     General Gait Details: Patient required constant cues to stay close and inside RW. Cannot follow through. +2 assist for lines/safety.   Stairs             Wheelchair Mobility     Tilt Bed    Modified Rankin (Stroke Patients Only)       Balance               Standing balance comment: Heavy reliant on RW for static standing, slight forward flexion noted                            Communication Communication Communication: No apparent difficulties Factors Affecting Communication: Difficulty expressing self  Cognition Arousal: Alert Behavior During Therapy: WFL for tasks assessed/performed   PT - Cognitive impairments: History of cognitive impairments, Memory, Problem solving, Safety/Judgement   Orientation impairments: Time                     Following commands: Impaired Following commands impaired: Follows one step commands inconsistently    Cueing Cueing Techniques: Verbal cues, Gestural cues  Exercises  General Comments General comments (skin integrity, edema, etc.): after amb spo2 88% on RA, >90% on RA at rest; HR in 60s bpm throughout      Pertinent Vitals/Pain Pain Assessment Pain Assessment: No/denies pain Pain Intervention(s): Monitored during session    Home Living                          Prior Function            PT Goals (current goals can now be found in the care plan section) Acute Rehab PT  Goals Patient Stated Goal: To get stronger PT Goal Formulation: With patient/family Time For Goal Achievement: 05/03/24 Potential to Achieve Goals: Good Progress towards PT goals: Progressing toward goals    Frequency    Min 3X/week      PT Plan      Co-evaluation PT/OT/SLP Co-Evaluation/Treatment: Yes Reason for Co-Treatment: For patient/therapist safety;To address functional/ADL transfers PT goals addressed during session: Mobility/safety with mobility;Balance;Proper use of DME OT goals addressed during session: ADL's and self-care      AM-PAC PT 6 Clicks Mobility   Outcome Measure  Help needed turning from your back to your side while in a flat bed without using bedrails?: A Little Help needed moving from lying on your back to sitting on the side of a flat bed without using bedrails?: A Little Help needed moving to and from a bed to a chair (including a wheelchair)?: A Little Help needed standing up from a chair using your arms (e.g., wheelchair or bedside chair)?: A Little Help needed to walk in hospital room?: A Little Help needed climbing 3-5 steps with a railing? : A Little 6 Click Score: 18    End of Session Equipment Utilized During Treatment: Oxygen Activity Tolerance: Patient tolerated treatment well Patient left: in chair;with call bell/phone within reach;with family/visitor present Nurse Communication: Mobility status;Other (comment) (O2 at 88 after walking, RN wanted to leave supplemental O2 off for now.) PT Visit Diagnosis: Other abnormalities of gait and mobility (R26.89);Muscle weakness (generalized) (M62.81);Difficulty in walking, not elsewhere classified (R26.2);Unsteadiness on feet (R26.81)     Time: 9143-9082 PT Time Calculation (min) (ACUTE ONLY): 21 min  Charges:    $Gait Training: 8-22 mins PT General Charges $$ ACUTE PT VISIT: 1 Visit                     Geary Rufo, PT, G[facility] 04/24/24,10:02 AM

## 2024-04-24 NOTE — Progress Notes (Signed)
 Patient's blood pressures trending upward (150/80s).  NP, Inge, notified and stated to restart cleviprex .

## 2024-04-24 NOTE — Progress Notes (Signed)
 Progress Note    04/24/2024 4:28 PM 5 Days Post-Op  Subjective:  Jack Bolio is a 78 yo male now POD #3 From:   Procedure(s) Performed:             1.  Ultrasound guidance for vascular access right femoral artery             2.  Catheter placement into right renal artery from right femoral approach             3.  Aortogram and selective right renal angiogram             4.  Balloon expandable stent placement to the right renal artery with a 7 mm diameter x 26 mm length Lifestream stent             5.  StarClose closure device right femoral artery   The patient is a 78 year old male with severe hypertension and ischemic nephropathy with a previous CT scan suggesting severe bilateral renal artery stenosis with worsening severe hypertension despite multiple home medications. The patient has suboptimal blood pressure control despite multiple antihypertensives and a noninvasive study demonstrating hemodynamically significant bilateral renal artery stenosis.  He was admitted to the hospital with a hypertensive crisis and also found to have a large type Ib endoleak with his previously repaired abdominal aortic aneurysm as given the clinical scenario and the noninvasive findings, angiogram is indicated for further evaluation of her renal artery and potential treatment.    Vitals:   04/24/24 1500 04/24/24 1530  BP: (!) 155/80   Pulse: (!) 59   Resp: 17   Temp:  98.2 F (36.8 C)  SpO2: 97%    Physical Exam: Cardiac:  Regular Rate but bradycardic at 40-50 bpm's. No murmurs appreciated.  Lungs:  Non labored clear throughout on auscultation.  No rales rhonchi or wheezing. Incisions: Right groin without complication, dressing clean dry and intact.  Extremities: All extremities with palpable pulses and warm to touch. Abdomen: Positive bowel sounds throughout, soft, nontender and nondistended Neurologic: Alert and oriented x 3, answers all questions and follows commands appropriately.  CBC     Component Value Date/Time   WBC 8.8 04/24/2024 0329   RBC 4.09 (L) 04/24/2024 0329   HGB 12.2 (L) 04/24/2024 0329   HGB 10.3 (L) 08/18/2013 0504   HCT 37.3 (L) 04/24/2024 0329   HCT 30.9 (L) 08/18/2013 0504   PLT 144 (L) 04/24/2024 0329   PLT 161 08/18/2013 0504   MCV 91.2 04/24/2024 0329   MCV 86 08/18/2013 0504   MCH 29.8 04/24/2024 0329   MCHC 32.7 04/24/2024 0329   RDW 15.3 04/24/2024 0329   RDW 16.0 (H) 08/18/2013 0504   LYMPHSABS 1.5 04/24/2024 0329   LYMPHSABS 2.3 08/18/2013 0504   MONOABS 0.8 04/24/2024 0329   MONOABS 1.1 (H) 08/18/2013 0504   EOSABS 0.6 (H) 04/24/2024 0329   EOSABS 0.2 08/18/2013 0504   BASOSABS 0.0 04/24/2024 0329   BASOSABS 0.0 08/18/2013 0504    BMET    Component Value Date/Time   NA 139 04/24/2024 0329   NA 144 10/23/2014 1630   K 3.6 04/24/2024 0329   K 4.1 10/23/2014 1630   CL 110 04/24/2024 0329   CL 112 (H) 10/23/2014 1630   CO2 20 (L) 04/24/2024 0329   CO2 26 10/23/2014 1630   GLUCOSE 91 04/24/2024 0329   GLUCOSE 83 10/23/2014 1630   BUN 36 (H) 04/24/2024 0329   BUN 30 (H) 10/23/2014 1630  CREATININE 2.15 (H) 04/24/2024 0329   CREATININE 1.96 (H) 10/23/2014 1630   CALCIUM 8.4 (L) 04/24/2024 0329   CALCIUM 9.0 10/23/2014 1630   GFRNONAA 31 (L) 04/24/2024 0329   GFRNONAA 36 (L) 10/23/2014 1630   GFRNONAA 8 (L) 08/18/2013 0504   GFRAA 39 (L) 10/25/2014 0352   GFRAA 44 (L) 10/23/2014 1630   GFRAA 9 (L) 08/18/2013 0504    INR    Component Value Date/Time   INR 1.1 04/23/2024 0310   INR 1.5 10/20/2014 0833     Intake/Output Summary (Last 24 hours) at 04/24/2024 1628 Last data filed at 04/24/2024 1400 Gross per 24 hour  Intake 501.56 ml  Output 1000 ml  Net -498.44 ml     Assessment/Plan:  78 y.o. male is s/p See Above 5 Days Post-Op   PLAN Patients kidney status continue to improve.  Keep Blood Pressure Systolic at 130-140. Okay to use Cleviprex  OOB to chair and ambulate in halls AAA repair planned for Friday  04/26/24 in the vascular Lab. Will continue to monitor patients kidney status. I discussed again this morning in detail with the patient, his wife and daughter the procedure, benefits, risks and complication. They all wish to proceed. Patient will be made NPO after midnight tonight.    Continue ASA 81 MG Daily and Plavix  75 mg Daily.    DVT prophylaxis:  ASA 81 mg and Plavix  75 mg    Livian Vanderbeck R [facility] 04/24/2024 4:28 PM

## 2024-04-24 NOTE — Plan of Care (Signed)

## 2024-04-24 NOTE — Progress Notes (Signed)
 [facility]  ROUNDING NOTE   Subjective:   Patient with past medical history of peripheral arterial disease, hypertension, abdominal aortic aneurysm, hyperlipidemia, bladder cancer, thrombocytopenia who was admitted with hypertensive crisis, abdominal aneurysm, type I endoleak of aortic graft.  Update: Creatinine up slightly today to 2.1. Urine output recorded was 750 cc  07/29 0701 - 07/30 0700 In: 924.6 [P.O.:300; I.V.:624.6] Out: 750 [Urine:750] Lab Results  Component Value Date   CREATININE 2.15 (H) 04/24/2024   CREATININE 2.08 (H) 04/23/2024   CREATININE 2.23 (H) 04/22/2024     Objective:  Vital signs in last 24 hours:  Temp:  [97.4 F (36.3 C)-98.5 F (36.9 C)] 97.4 F (36.3 C) (07/30 0800) Pulse Rate:  [43-85] 60 (07/30 0939) Resp:  [12-27] 17 (07/30 0800) BP: (100-161)/(51-119) 142/75 (07/30 0939) SpO2:  [88 %-100 %] 88 % (07/30 0947) Weight:  [97.4 kg] 97.4 kg (07/30 0428)  Weight change:  Filed Weights   04/22/24 0500 04/23/24 0704 04/24/24 0428  Weight: 99.6 kg 99.6 kg 97.4 kg    Intake/Output: I/O last 3 completed shifts: In: 1337.6 [P.O.:300; I.V.:1037.6] Out: 1550 [Urine:1550]   Intake/Output this shift:  Total I/O In: 28 [I.V.:28] Out: 300 [Urine:300]  Physical Exam: General: No acute distress  Head: Normocephalic, atraumatic. Moist oral mucosal membranes  Neck: Supple  Lungs:  Scattered rhonchi, normal effort  Heart: S1S2 no rubs  Abdomen:  Soft, nontender, bowel sounds present  Extremities: Trace peripheral edema.  Neurologic: Awake, alert, following commands  Skin: No acute rash  Access: No hemodialysis access    Basic Metabolic Panel: Recent Labs  Lab 04/18/24 0324 04/19/24 0232 04/20/24 0520 04/21/24 0413 04/22/24 0515 04/23/24 0311 04/24/24 0329  NA 139 139 140 137 142 142 139  K 4.1 4.5 4.4 3.9 4.0 3.8 3.6  CL 107 111 111 110 112* 113* 110  CO2 21* 19* 17* 16* 20* 22 20*  GLUCOSE 88 76 61* 91 91 85 91   BUN 50* 54* 50* 46* 42* 38* 36*  CREATININE 3.20* 2.86* 2.64* 2.54* 2.23* 2.08* 2.15*  CALCIUM 8.4* 7.9* 8.5* 8.5* 8.5* 8.4* 8.4*  MG 2.1 2.3 2.3  --  2.3 2.3 2.2  PHOS 5.1* 5.4* 4.7*  --   --  2.7 3.0    Liver Function Tests: No results for input(s): AST, ALT, ALKPHOS, BILITOT, PROT, ALBUMIN  in the last 168 hours. No results for input(s): LIPASE, AMYLASE in the last 168 hours. No results for input(s): AMMONIA in the last 168 hours.  CBC: Recent Labs  Lab 04/18/24 0324 04/19/24 0232 04/20/24 0520 04/21/24 0413 04/22/24 0515 04/23/24 0311 04/24/24 0329  WBC 11.0* 9.1 8.8 8.4 8.1 8.1 8.8  NEUTROABS 8.7* 7.3  --   --   --  5.5 5.9  HGB 14.3 12.7* 13.4 12.6* 12.7* 12.2* 12.2*  HCT 42.5 38.9* 41.8 37.8* 39.5 36.8* 37.3*  MCV 90.6 92.8 93.5 92.4 91.6 90.9 91.2  PLT 144* 117* 140* 132* 142* 138* 144*    Cardiac Enzymes: No results for input(s): CKTOTAL, CKMB, CKMBINDEX, TROPONINI in the last 168 hours.  BNP: Invalid input(s): POCBNP  CBG: Recent Labs  Lab 04/23/24 1929 04/23/24 2324 04/24/24 0339 04/24/24 0727 04/24/24 1135  GLUCAP 108* 105* 83 84 101*    Microbiology: Results for orders placed or performed during the hospital encounter of 04/15/24  Blood culture (routine x 2)     Status: None   Collection Time: 04/15/24  8:55 PM   Specimen: BLOOD  Result Value Ref  Range Status   Specimen Description BLOOD BLOOD RIGHT ARM  Final   Special Requests   Final    BOTTLES DRAWN AEROBIC AND ANAEROBIC Blood Culture adequate volume   Culture   Final    NO GROWTH 5 DAYS Performed at [facility], [address] [place], [place] [address]    Report Status 04/20/2024 FINAL  Final  Blood culture (routine x 2)     Status: None   Collection Time: 04/15/24  8:55 PM   Specimen: BLOOD  Result Value Ref Range Status   Specimen Description BLOOD BLOOD LEFT ARM  Final   Special Requests   Final    BOTTLES DRAWN AEROBIC AND ANAEROBIC  Blood Culture results may not be optimal due to an inadequate volume of blood received in culture bottles   Culture   Final    NO GROWTH 5 DAYS Performed at [facility], [address], [place], [place] [address]    Report Status 04/20/2024 FINAL  Final  MRSA Next Gen by PCR, Nasal     Status: None   Collection Time: 04/15/24 10:01 PM   Specimen: Nasal Mucosa; Nasal Swab  Result Value Ref Range Status   MRSA by PCR Next Gen NOT DETECTED NOT DETECTED Final    Comment: (NOTE) The GeneXpert MRSA Assay (FDA approved for NASAL specimens only), is one component of a comprehensive MRSA colonization surveillance program. It is not intended to diagnose MRSA infection nor to guide or monitor treatment for MRSA infections. Test performance is not FDA approved in patients less than 60 years old. Performed at [facility], [address] [place], [place] [address]   Culture, Respiratory w Gram Stain     Status: None   Collection Time: 04/16/24 11:42 AM   Specimen: SPU; Respiratory  Result Value Ref Range Status   Specimen Description   Final    SPUTUM Performed at [facility], [address], [place], [place] [address]    Special Requests   Final     EXPSU Performed at [facility], [address] [place], [place] [address]    Gram Stain   Final    ABUNDANT SQUAMOUS EPITHELIAL CELLS PRESENT MODERATE GRAM POSITIVE COCCI MODERATE GRAM NEGATIVE RODS    Culture   Final    Normal respiratory flora-no Staph aureus or Pseudomonas seen Performed at [facility], [address] [place], [place] [address]    Report Status 04/18/2024 FINAL  Final  Respiratory (~20 pathogens) panel by PCR     Status: None   Collection Time: 04/16/24 11:42 AM   Specimen: Expectorated Sputum; Respiratory  Result Value Ref Range Status   Adenovirus NOT DETECTED NOT DETECTED Final   Coronavirus 229E NOT DETECTED NOT DETECTED Final    Comment: (NOTE) The  Coronavirus on the Respiratory Panel, DOES NOT test for the novel  Coronavirus (2019 nCoV)    Coronavirus HKU1 NOT DETECTED NOT DETECTED Final   Coronavirus NL63 NOT DETECTED NOT DETECTED Final   Coronavirus OC43 NOT DETECTED NOT DETECTED Final   Metapneumovirus NOT DETECTED NOT DETECTED Final   Rhinovirus / Enterovirus NOT DETECTED NOT DETECTED Final   Influenza A NOT DETECTED NOT DETECTED Final   Influenza B NOT DETECTED NOT DETECTED Final   Parainfluenza Virus 1 NOT DETECTED NOT DETECTED Final   Parainfluenza Virus 2 NOT DETECTED NOT DETECTED Final   Parainfluenza Virus 3 NOT DETECTED NOT DETECTED Final   Parainfluenza Virus 4 NOT DETECTED  NOT DETECTED Final   Respiratory Syncytial Virus NOT DETECTED NOT DETECTED Final   Bordetella pertussis NOT DETECTED NOT DETECTED Final   Bordetella Parapertussis NOT DETECTED NOT DETECTED Final   Chlamydophila pneumoniae NOT DETECTED NOT DETECTED Final   Mycoplasma pneumoniae NOT DETECTED NOT DETECTED Final    Comment: Performed at [facility], [address], [place], [place] [address]    Coagulation Studies: Recent Labs    04/23/24 0310  LABPROT 14.5  INR 1.1     Urinalysis: No results for input(s): COLORURINE, LABSPEC, PHURINE, GLUCOSEU, HGBUR, BILIRUBINUR, KETONESUR, PROTEINUR, UROBILINOGEN, NITRITE, LEUKOCYTESUR in the last 72 hours.  Invalid input(s): APPERANCEUR    Imaging: No results found.    Medications:      amLODipine   10 mg Oral Daily   aspirin  EC  81 mg Oral Daily   Chlorhexidine  Gluconate Cloth  6 each Topical Daily   clopidogrel   75 mg Oral Daily   feeding supplement  237 mL Oral TID BM   hydrALAZINE   50 mg Oral Q6H   metoprolol  tartrate  25 mg Oral BID   multivitamin with minerals  1 tablet Oral Daily   polyethylene glycol  17 g Oral Daily   acetaminophen , docusate sodium , hydrALAZINE , ondansetron  (ZOFRAN ) IV, mouth rinse  Assessment/ Plan:  78 y.o. male with past medical  history of peripheral arterial disease, hypertension, abdominal aortic aneurysm, hyperlipidemia, bladder cancer, thrombocytopenia who was admitted with hypertensive crisis, abdominal aneurysm, endoleak of aortic graft.  1.  Acute kidney injury/chronic kidney disease stage IIIb -baseline creatinine 2.2 with EGFR 30 on 01/16/2024.  Acute kidney injury likely multifactorial with contribution from hypoperfusion from bilateral renal artery stenosis, and exposure to contrast with CT angio chest/pelvis/abdomen.   07/29 0701 - 07/30 0700 In: 924.6 [P.O.:300; I.V.:624.6] Out: 750 [Urine:750] Lab Results  Component Value Date   CREATININE 2.15 (H) 04/24/2024   CREATININE 2.08 (H) 04/23/2024   CREATININE 2.23 (H) 04/22/2024   Update: Renal function ever so slightly worse today with a creatinine of 2.1.  Continue to monitor renal parameters daily for now.  Avoid nephrotoxins as possible.  2.  Hypertension.  Patient off of both clevidipine  and esmolol  now.  Continue amlodipine , hydralazine , metoprolol .  3.  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 Marea. Right renal artery stent placed.  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 Vascular surgery team is following along.    LOS: 9 Ikia Cincotta 7/30/202511:54 AM

## 2024-04-25 DIAGNOSIS — N189 Chronic kidney disease, unspecified: Secondary | ICD-10-CM | POA: Diagnosis not present

## 2024-04-25 DIAGNOSIS — N179 Acute kidney failure, unspecified: Secondary | ICD-10-CM | POA: Diagnosis not present

## 2024-04-25 DIAGNOSIS — R042 Hemoptysis: Secondary | ICD-10-CM | POA: Diagnosis not present

## 2024-04-25 DIAGNOSIS — T82330A Leakage of aortic (bifurcation) graft (replacement), initial encounter: Secondary | ICD-10-CM | POA: Diagnosis not present

## 2024-04-25 DIAGNOSIS — I161 Hypertensive emergency: Secondary | ICD-10-CM | POA: Diagnosis not present

## 2024-04-25 DIAGNOSIS — I701 Atherosclerosis of renal artery: Secondary | ICD-10-CM | POA: Diagnosis not present

## 2024-04-25 DIAGNOSIS — T82310A Breakdown (mechanical) of aortic (bifurcation) graft (replacement), initial encounter: Secondary | ICD-10-CM | POA: Diagnosis not present

## 2024-04-25 LAB — RENAL FUNCTION PANEL
Albumin: 2.9 g/dL — ABNORMAL LOW (ref 3.5–5.0)
Anion gap: 9 (ref 5–15)
BUN: 33 mg/dL — ABNORMAL HIGH (ref 8–23)
CO2: 21 mmol/L — ABNORMAL LOW (ref 22–32)
Calcium: 8.7 mg/dL — ABNORMAL LOW (ref 8.9–10.3)
Chloride: 109 mmol/L (ref 98–111)
Creatinine, Ser: 1.91 mg/dL — ABNORMAL HIGH (ref 0.61–1.24)
GFR, Estimated: 35 mL/min — ABNORMAL LOW (ref >60)
Glucose, Bld: 76 mg/dL (ref 70–99)
Phosphorus: 2.6 mg/dL (ref 2.5–4.6)
Potassium: 4 mmol/L (ref 3.5–5.1)
Sodium: 139 mmol/L (ref 135–145)

## 2024-04-25 LAB — CBC
HCT: 37 % — ABNORMAL LOW (ref 39.0–52.0)
Hemoglobin: 12.5 g/dL — ABNORMAL LOW (ref 13.0–17.0)
MCH: 30.6 pg (ref 26.0–34.0)
MCHC: 33.8 g/dL (ref 30.0–36.0)
MCV: 90.7 fL (ref 80.0–100.0)
Platelets: 163 K/uL (ref 150–400)
RBC: 4.08 MIL/uL — ABNORMAL LOW (ref 4.22–5.81)
RDW: 15.2 % (ref 11.5–15.5)
WBC: 9.3 K/uL (ref 4.0–10.5)
nRBC: 0 % (ref 0.0–0.2)

## 2024-04-25 LAB — TRIGLYCERIDES: Triglycerides: 128 mg/dL (ref <150)

## 2024-04-25 LAB — MAGNESIUM: Magnesium: 2.1 mg/dL (ref 1.7–2.4)

## 2024-04-25 LAB — GLUCOSE, CAPILLARY
Glucose-Capillary: 102 mg/dL — ABNORMAL HIGH (ref 70–99)
Glucose-Capillary: 103 mg/dL — ABNORMAL HIGH (ref 70–99)
Glucose-Capillary: 75 mg/dL (ref 70–99)
Glucose-Capillary: 76 mg/dL (ref 70–99)
Glucose-Capillary: 84 mg/dL (ref 70–99)
Glucose-Capillary: 97 mg/dL (ref 70–99)

## 2024-04-25 NOTE — Plan of Care (Signed)
  Problem: Education: Goal: Knowledge of General Education information will improve Description: Including pain rating scale, medication(s)/side effects and non-pharmacologic comfort measures Outcome: Progressing   Problem: Health Behavior/Discharge Planning: Goal: Ability to manage health-related needs will improve Outcome: Progressing   Problem: Clinical Measurements: Goal: Will remain free from infection Outcome: Progressing Goal: Diagnostic test results will improve Outcome: Progressing Goal: Respiratory complications will improve Outcome: Progressing   Problem: Activity: Goal: Risk for activity intolerance will decrease Outcome: Progressing   Problem: Elimination: Goal: Will not experience complications related to bowel motility Outcome: Progressing Goal: Will not experience complications related to urinary retention Outcome: Progressing   Problem: Pain Managment: Goal: General experience of comfort will improve and/or be controlled Outcome: Progressing   Problem: Skin Integrity: Goal: Risk for impaired skin integrity will decrease Outcome: Progressing   Problem: Clinical Measurements: Goal: Ability to maintain clinical measurements within normal limits will improve Outcome: Not Progressing   Problem: Nutrition: Goal: Adequate nutrition will be maintained Outcome: Not Progressing   Problem: Coping: Goal: Level of anxiety will decrease Outcome: Not Progressing

## 2024-04-25 NOTE — Progress Notes (Signed)
 NAME:  Nathan Rivera, MRN:  [id number], DOB:  06/01/1946, LOS: 10 ADMISSION DATE:  04/15/2024,  History of Present Illness:  78 y.o male with significant PMH of Infrarenal abdominal aortic aneurysm s/p endovascular repair of a ruptured AAA in 2011 on Xarelto , PAD, HTN, duodenal ulcer with perforation s/p omental patch repair, CKD stage III, thrombocytopenia, HDL, prior tobacco use, ESBL bacteremia, bladder cancer, chronic back pain, and bilateral inguinal hernia s/p repair who presented to the ED with chief complaints of abdominal pain and hemoptysis.   Patient and family at bedside reports that he took his last dose of Xarelto  yesterday evening.  He subsequently developed some abdominal pain around the epigastric and left upper quadrant region.  Since onset of symptoms has been coughing up bright red blood which he describes  blood filling up his mouth.  Denies other associated symptoms   ED Course: Initial vital signs showed HR of 68 beats/minute, BP 192/102 mm Hg, the RR 18 breaths/minute, and the oxygen saturation 93% on RA and a temperature of 98.21F (36.7C). Pertinent Labs/Diagnostics Findings: Na+/ K+:140/4.2 Glucose:94  BUN/Cr.:39/2.43 TAR:[id number]  PCT: negative <0.10  BNP: 394.5  CTA Chest> CT Abd/pelvis> Saccular aneurysm at the level of the proximal stent with some soft tissue thickening and stranding concerning possible endoleak or impending rupture,Large aneurysm involving the left common iliac artery, partially encompasses the left iliac stent, Aneurysmal dilatation of the right common iliac artery at the distal end of the stent  Multiple small aneurysms of the proximal internal and iliac vessels.    CT findings discussed with on-call vascular Nathan Rivera who recommended hemodynamic monitoring as it is unlikely patient will require emergent intervention but may require repair during the course of admission. PCCM consulted for admission, BP management and close  monitoring.  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 Aorto bifemoral endograft placement for AAA,  Chronic right-sided low back pain without sciatica;  Thrombocytopenia  Raynaud's disease with gangrene  B12 deficiency;  Personal history of malignant neoplasm of bladder;  Tobacco use disorder;  Hematuria, gross  ESBL Bacteremia due to septic shock due to obstructive stone with Pyelonephritis bilateral Inguinal Hernia s/p repair (02/24)  Significant Hospital Events: Including procedures, antibiotic start and stop dates in addition to other pertinent events   7/21: admit to ICU, on clevidipine  and esmolol  7/22: no recurrence of hemoptysis, but he continues to have a cough. EGD normal without any PUD or sign of GI bleed 7/23: worsening renal function, holding on surgical intervention 7/24: kidney function stable, ileus, N/V in the AM. Sat in chair 7/25: slept well overnight, kidney function improved. No complaints. Underwent renal artery stenting. 7/26: passing flatus, no abdominal pain 7/27: delirium, no pain reported but he is confused today 07/28: Patient complaining of new back pain, appears to be muscular. Remains on clevidipine  and emolol drip for strict bp control.   Interim History / Subjective:  Nathan Rivera is doing well and has no major complaints.  Pending surgical intervention tomorrw.   Remains on cleveprex drip with hopes to come off today.  Kidney function continues to improve with Cr. Now at 1.91 mg/dl.   Objective    Blood pressure 138/75, pulse 69, temperature 98.6 F (37 C), temperature source Axillary, resp. rate (!) 21, height 5' 9 (1.753 m), weight 98.1 kg, SpO2  93%.        Intake/Output Summary (Last 24 hours) at 04/25/2024 0936 Last data filed  at 04/25/2024 0817 Gross per 24 hour  Intake 226.37 ml  Output 1950 ml  Net -1723.63 ml   Filed Weights   04/23/24 0704 04/24/24 0428 04/25/24 0500  Weight: 99.6 kg 97.4 kg 98.1 kg    Examination: General: NAD HENT: Supple neck, reactive pupils Lungs: Clear bilateral air entry Cardiovascular: Normal S1, Normal S2, RRR Abdomen: Soft, non tender, non distended, +BS  Extremities: Warm and well perfused no edema.   Labs and imaging were reviewed.   Assessment and Plan  #Hemoptysis #Endoleak with Aneurysm Expansion #History of Infrarenal AAA s/p endovascular repeair s/p R renal artery stenting 07/25 with improvement in AKI. Plan for L renal artery intervention on 07/30.  #HTN emergency #AKI on CKD improving baseline Cr. 2.2 mg/dl.  #Community Acquired Pneumonia #Mild Cognitive Impairment   78 year old male presenting with an episode of hemoptysis (golf ball sized) with concern for hematemesis given history of GI bleed in the past. Patient was found to have a possible endoleak with threat of rupture and is admitted to the ICU for tight blood pressure control. He is s/p renal artery stenting.   Neuro - history of mild cognitive impairment, holding memantine and donepezil  given bradycardia. Continues to have delirium. CV - history of hypertension, and presented with elevated blood pressure. Started on strict HR and BP control given concern for endovascular leak and risk of AAA rupture. He does have significant vascular disease (bilateral severe renal artery stenosis, severe celiac artery stenosis, infrarenal abdominal aortoiliac stent graft, aneurysmal dilation of the abdominal aorta at the level of the renal arteries with a saccular component concerning for leakage vs impending rupture, left common iliac artery aneurysm). Given renal disease and need for contrast, endoleak repair is delayed, and he underwent renal artery stenosis stenting. Continue with goal SBP of 130-140 mmHg and HR <  60.             -titrate clevidipine  for goal SBP of 130-140             -titrate esmolol  for goal HR < 60  - Metoprolol  tartrate 25mg  BID, Amlodipine  10mg  Daily and Hydralazine  50mg  Q6H scheduled. Weaning down drips as possible.  NPO after midnight Thursday.  Pulm - No recurrence of his hemoptysis since his admission. Chest CT with multiple tree in bud nodules as well as minimal ground glass opacities that were scattered, concerning for viral infection versus bacterial pneumonia/bronchitis. He remains on nasal cannula and oxygenation is improved with holding of fluids, mobilization, and incentive spirometry. GI - Underwent EGD that did not show any PUD or sign of GI bleed. Discontinued high dose PPI. CT abdomen with signs of ileus. Tried NG tube but did not tolerate. Bowel sounds are improved, and he has passed flatus. Trial clear liquid diet and ok to advance diet as tolerated. Continue with OOB to chair. NPO after midnight.  Renal - AKI on CKD, with bilateral renal artery stenosis. This was likely exacerbated by contrast exposure. Holding IV fluids and liberalizing BP requirements to allow for kidney perfusion (see CV above). Kidney function improved today.  Endo - ICU glycemic protocol Hem/Onc - on home rivaroxaban , holding given hemoptysis. H/H stable We need to decide on reinitiating in the am.  ID - Viral panel negative, on Ceftriaxone  and Azithromycin  for CAP coverage completed course. Sputum culture no growth.   Best Practice (right click  and Reselect all SmartList Selections daily)   Diet/type: Regular consistency (see orders) DVT prophylaxis not indicated Pressure ulcer(s): N/A GI prophylaxis: N/A Lines: N/A Foley:  N/A Code Status:  full code  Last date of multidisciplinary goals of care discussion [04/25/2024`]  Critical care time: 35 minutes    Darrin Barn, MD [facility] 04/25/2024 9:36 AM

## 2024-04-25 NOTE — Progress Notes (Signed)
 [facility]  ROUNDING NOTE   Subjective:   Patient with past medical history of peripheral arterial disease, hypertension, abdominal aortic aneurysm, hyperlipidemia, bladder cancer, thrombocytopenia who was admitted with hypertensive crisis, abdominal aneurysm, type I endoleak of aortic graft.  Update: Creatinine down to 1.9. Urine output much better at 1.7 L over the preceding 24 hours. Patient to have additional vascular intervention tomorrow.  07/30 0701 - 07/31 0700 In: 240.4 [I.V.:240.4] Out: 1750 [Urine:1750] Lab Results  Component Value Date   CREATININE 1.91 (H) 04/25/2024   CREATININE 2.15 (H) 04/24/2024   CREATININE 2.08 (H) 04/23/2024     Objective:  Vital signs in last 24 hours:  Temp:  [97.9 F (36.6 C)-98.6 F (37 C)] 98.6 F (37 C) (07/31 0800) Pulse Rate:  [43-72] 59 (07/31 1100) Resp:  [13-26] 15 (07/31 1100) BP: (110-158)/(36-124) 144/73 (07/31 1100) SpO2:  [88 %-100 %] 95 % (07/31 1100) Weight:  [98.1 kg] 98.1 kg (07/31 0500)  Weight change: -1.5 kg Filed Weights   04/23/24 0704 04/24/24 0428 04/25/24 0500  Weight: 99.6 kg 97.4 kg 98.1 kg    Intake/Output: I/O last 3 completed shifts: In: 564.6 [I.V.:564.6] Out: 2500 [Urine:2500]   Intake/Output this shift:  Total I/O In: 182.2 [P.O.:120; I.V.:62.2] Out: 200 [Urine:200]  Physical Exam: General: No acute distress  Head: Normocephalic, atraumatic. Moist oral mucosal membranes  Neck: Supple  Lungs:  Scattered rhonchi, normal effort  Heart: S1S2 no rubs  Abdomen:  Soft, nontender, bowel sounds present  Extremities: Trace peripheral edema.  Neurologic: Awake, alert, following commands  Skin: No acute rash  Access: No hemodialysis access    Basic Metabolic Panel: Recent Labs  Lab 04/19/24 0232 04/20/24 0520 04/21/24 0413 04/22/24 0515 04/23/24 0311 04/24/24 0329 04/25/24 0224  NA 139 140 137 142 142 139 139  K 4.5 4.4 3.9 4.0 3.8 3.6 4.0  CL 111 111 110 112* 113*  110 109  CO2 19* 17* 16* 20* 22 20* 21*  GLUCOSE 76 61* 91 91 85 91 76  BUN 54* 50* 46* 42* 38* 36* 33*  CREATININE 2.86* 2.64* 2.54* 2.23* 2.08* 2.15* 1.91*  CALCIUM 7.9* 8.5* 8.5* 8.5* 8.4* 8.4* 8.7*  MG 2.3 2.3  --  2.3 2.3 2.2 2.1  PHOS 5.4* 4.7*  --   --  2.7 3.0 2.6    Liver Function Tests: Recent Labs  Lab 04/25/24 0224  ALBUMIN  2.9*   No results for input(s): LIPASE, AMYLASE in the last 168 hours. No results for input(s): AMMONIA in the last 168 hours.  CBC: Recent Labs  Lab 04/19/24 0232 04/20/24 0520 04/21/24 0413 04/22/24 0515 04/23/24 0311 04/24/24 0329 04/25/24 0224  WBC 9.1   < > 8.4 8.1 8.1 8.8 9.3  NEUTROABS 7.3  --   --   --  5.5 5.9  --   HGB 12.7*   < > 12.6* 12.7* 12.2* 12.2* 12.5*  HCT 38.9*   < > 37.8* 39.5 36.8* 37.3* 37.0*  MCV 92.8   < > 92.4 91.6 90.9 91.2 90.7  PLT 117*   < > 132* 142* 138* 144* 163   < > = values in this interval not displayed.    Cardiac Enzymes: No results for input(s): CKTOTAL, CKMB, CKMBINDEX, TROPONINI in the last 168 hours.  BNP: Invalid input(s): POCBNP  CBG: Recent Labs  Lab 04/24/24 1608 04/24/24 1938 04/24/24 2328 04/25/24 0324 04/25/24 0733  GLUCAP 104* 99 90 76 75    Microbiology: Results for orders placed or  performed during the hospital encounter of 04/15/24  Blood culture (routine x 2)     Status: None   Collection Time: 04/15/24  8:55 PM   Specimen: BLOOD  Result Value Ref Range Status   Specimen Description BLOOD BLOOD RIGHT ARM  Final   Special Requests   Final    BOTTLES DRAWN AEROBIC AND ANAEROBIC Blood Culture adequate volume   Culture   Final    NO GROWTH 5 DAYS Performed at [facility], [address] [place], [place] [address]    Report Status 04/20/2024 FINAL  Final  Blood culture (routine x 2)     Status: None   Collection Time: 04/15/24  8:55 PM   Specimen: BLOOD  Result Value Ref Range Status   Specimen Description BLOOD BLOOD LEFT ARM  Final    Special Requests   Final    BOTTLES DRAWN AEROBIC AND ANAEROBIC Blood Culture results may not be optimal due to an inadequate volume of blood received in culture bottles   Culture   Final    NO GROWTH 5 DAYS Performed at [facility], [address], [place], [place] [address]    Report Status 04/20/2024 FINAL  Final  MRSA Next Gen by PCR, Nasal     Status: None   Collection Time: 04/15/24 10:01 PM   Specimen: Nasal Mucosa; Nasal Swab  Result Value Ref Range Status   MRSA by PCR Next Gen NOT DETECTED NOT DETECTED Final    Comment: (NOTE) The GeneXpert MRSA Assay (FDA approved for NASAL specimens only), is one component of a comprehensive MRSA colonization surveillance program. It is not intended to diagnose MRSA infection nor to guide or monitor treatment for MRSA infections. Test performance is not FDA approved in patients less than 4 years old. Performed at [facility], [address] [place], [place] [address]   Culture, Respiratory w Gram Stain     Status: None   Collection Time: 04/16/24 11:42 AM   Specimen: SPU; Respiratory  Result Value Ref Range Status   Specimen Description   Final    SPUTUM Performed at [facility], [address], [place], [place] [address]    Special Requests   Final     EXPSU Performed at [facility], [address] [place], [place] [address]    Gram Stain   Final    ABUNDANT SQUAMOUS EPITHELIAL CELLS PRESENT MODERATE GRAM POSITIVE COCCI MODERATE GRAM NEGATIVE RODS    Culture   Final    Normal respiratory flora-no Staph aureus or Pseudomonas seen Performed at [facility], [address] [place], [place] [address]    Report Status 04/18/2024 FINAL  Final  Respiratory (~20 pathogens) panel by PCR     Status: None   Collection Time: 04/16/24 11:42 AM   Specimen: Expectorated Sputum; Respiratory  Result Value Ref Range Status   Adenovirus NOT DETECTED NOT DETECTED Final   Coronavirus 229E  NOT DETECTED NOT DETECTED Final    Comment: (NOTE) The Coronavirus on the Respiratory Panel, DOES NOT test for the novel  Coronavirus (2019 nCoV)    Coronavirus HKU1 NOT DETECTED NOT DETECTED Final   Coronavirus NL63 NOT DETECTED NOT DETECTED Final   Coronavirus OC43 NOT DETECTED NOT DETECTED Final   Metapneumovirus NOT DETECTED NOT DETECTED Final   Rhinovirus / Enterovirus NOT DETECTED NOT DETECTED Final   Influenza A NOT DETECTED NOT DETECTED Final   Influenza B NOT DETECTED NOT DETECTED Final  Parainfluenza Virus 1 NOT DETECTED NOT DETECTED Final   Parainfluenza Virus 2 NOT DETECTED NOT DETECTED Final   Parainfluenza Virus 3 NOT DETECTED NOT DETECTED Final   Parainfluenza Virus 4 NOT DETECTED NOT DETECTED Final   Respiratory Syncytial Virus NOT DETECTED NOT DETECTED Final   Bordetella pertussis NOT DETECTED NOT DETECTED Final   Bordetella Parapertussis NOT DETECTED NOT DETECTED Final   Chlamydophila pneumoniae NOT DETECTED NOT DETECTED Final   Mycoplasma pneumoniae NOT DETECTED NOT DETECTED Final    Comment: Performed at [facility], [address], [place], [place] [address]    Coagulation Studies: Recent Labs    04/23/24 0310  LABPROT 14.5  INR 1.1     Urinalysis: No results for input(s): COLORURINE, LABSPEC, PHURINE, GLUCOSEU, HGBUR, BILIRUBINUR, KETONESUR, PROTEINUR, UROBILINOGEN, NITRITE, LEUKOCYTESUR in the last 72 hours.  Invalid input(s): APPERANCEUR    Imaging: No results found.    Medications:    clevidipine  11 mg/hr (04/25/24 1108)     amLODipine   10 mg Oral Daily   aspirin  EC  81 mg Oral Daily   Chlorhexidine  Gluconate Cloth  6 each Topical Daily   clopidogrel   75 mg Oral Daily   feeding supplement  237 mL Oral TID BM   hydrALAZINE   50 mg Oral Q6H   metoprolol  tartrate  25 mg Oral BID   multivitamin with minerals  1 tablet Oral Daily   polyethylene glycol  17 g Oral Daily   acetaminophen , docusate sodium ,  hydrALAZINE , ondansetron  (ZOFRAN ) IV, mouth rinse  Assessment/ Plan:  78 y.o. male with past medical history of peripheral arterial disease, hypertension, abdominal aortic aneurysm, hyperlipidemia, bladder cancer, thrombocytopenia who was admitted with hypertensive crisis, abdominal aneurysm, endoleak of aortic graft.  1.  Acute kidney injury/chronic kidney disease stage IIIb -baseline creatinine 2.2 with EGFR 30 on 01/16/2024.  Acute kidney injury likely multifactorial with contribution from hypoperfusion from bilateral renal artery stenosis, and exposure to contrast with CT angio chest/pelvis/abdomen.   07/30 0701 - 07/31 0700 In: 240.4 [I.V.:240.4] Out: 1750 [Urine:1750] Lab Results  Component Value Date   CREATININE 1.91 (H) 04/25/2024   CREATININE 2.15 (H) 04/24/2024   CREATININE 2.08 (H) 04/23/2024   Update: Renal function improved.  Creatinine down to 1.9.  Good urine output 1.7 L.  Patient to have additional vascular intervention tomorrow which may cause renal function to fluctuate.  Patient aware of this risk.  2.  Hypertension.  Patient back on clevidipine .  Continue amlodipine , hydralazine , and metoprolol  as well.  3.  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 Marea. Right renal artery stent placed.  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 Vascular surgery team is following along.    LOS: [address] [place] 7/31/202511:10 AM

## 2024-04-25 NOTE — Progress Notes (Signed)
 Progress Note    04/25/2024 12:08 PM 6 Days Post-Op  Subjective:  Nathan Rivera is a 78 yo male now POD #3 From:   Procedure(s) Performed:             1.  Ultrasound guidance for vascular access right femoral artery             2.  Catheter placement into right renal artery from right femoral approach             3.  Aortogram and selective right renal angiogram             4.  Balloon expandable stent placement to the right renal artery with a 7 mm diameter x 26 mm length Lifestream stent             5.  StarClose closure device right femoral artery   The patient is a 78 year old male with severe hypertension and ischemic nephropathy with a previous CT scan suggesting severe bilateral renal artery stenosis with worsening severe hypertension despite multiple home medications. The patient has suboptimal blood pressure control despite multiple antihypertensives and a noninvasive study demonstrating hemodynamically significant bilateral renal artery stenosis.  He was admitted to the hospital with a hypertensive crisis and also found to have a large type Ib endoleak with his previously repaired abdominal aortic aneurysm as given the clinical scenario and the noninvasive findings, angiogram is indicated for further evaluation of her renal artery and potential treatment.    Vitals:   04/25/24 1000 04/25/24 1100  BP: (!) 149/77 (!) 144/73  Pulse: 64 (!) 59  Resp: 14 15  Temp:    SpO2: 96% 95%   Physical Exam: Cardiac:  Regular Rate but bradycardic at 40-50 bpm's. No murmurs appreciated.  Lungs:  Non labored clear throughout on auscultation.  No rales rhonchi or wheezing. Incisions: Right groin without complication, dressing clean dry and intact.  Extremities: All extremities with palpable pulses and warm to touch. Abdomen: Positive bowel sounds throughout, soft, nontender and nondistended Neurologic: Alert and oriented x 3, answers all questions and follows commands appropriately.  CBC     Component Value Date/Time   WBC 9.3 04/25/2024 0224   RBC 4.08 (L) 04/25/2024 0224   HGB 12.5 (L) 04/25/2024 0224   HGB 10.3 (L) 08/18/2013 0504   HCT 37.0 (L) 04/25/2024 0224   HCT 30.9 (L) 08/18/2013 0504   PLT 163 04/25/2024 0224   PLT 161 08/18/2013 0504   MCV 90.7 04/25/2024 0224   MCV 86 08/18/2013 0504   MCH 30.6 04/25/2024 0224   MCHC 33.8 04/25/2024 0224   RDW 15.2 04/25/2024 0224   RDW 16.0 (H) 08/18/2013 0504   LYMPHSABS 1.5 04/24/2024 0329   LYMPHSABS 2.3 08/18/2013 0504   MONOABS 0.8 04/24/2024 0329   MONOABS 1.1 (H) 08/18/2013 0504   EOSABS 0.6 (H) 04/24/2024 0329   EOSABS 0.2 08/18/2013 0504   BASOSABS 0.0 04/24/2024 0329   BASOSABS 0.0 08/18/2013 0504    BMET    Component Value Date/Time   NA 139 04/25/2024 0224   NA 144 10/23/2014 1630   K 4.0 04/25/2024 0224   K 4.1 10/23/2014 1630   CL 109 04/25/2024 0224   CL 112 (H) 10/23/2014 1630   CO2 21 (L) 04/25/2024 0224   CO2 26 10/23/2014 1630   GLUCOSE 76 04/25/2024 0224   GLUCOSE 83 10/23/2014 1630   BUN 33 (H) 04/25/2024 0224   BUN 30 (H) 10/23/2014 1630   CREATININE 1.91 (H)  04/25/2024 0224   CREATININE 1.96 (H) 10/23/2014 1630   CALCIUM 8.7 (L) 04/25/2024 0224   CALCIUM 9.0 10/23/2014 1630   GFRNONAA 35 (L) 04/25/2024 0224   GFRNONAA 36 (L) 10/23/2014 1630   GFRNONAA 8 (L) 08/18/2013 0504   GFRAA 39 (L) 10/25/2014 0352   GFRAA 44 (L) 10/23/2014 1630   GFRAA 9 (L) 08/18/2013 0504    INR    Component Value Date/Time   INR 1.1 04/23/2024 0310   INR 1.5 10/20/2014 0833     Intake/Output Summary (Last 24 hours) at 04/25/2024 1208 Last data filed at 04/25/2024 1108 Gross per 24 hour  Intake 313.43 ml  Output 1450 ml  Net -1136.57 ml     Assessment/Plan:  78 y.o. male is s/p See Above  6 Days Post-Op   PLAN Patients kidney status continue to improve. Bun/Creatine  33/1.91 Keep Blood Pressure Systolic at 130-140. Okay to use Cleviprex  OOB to chair and ambulate in halls AAA repair  planned for Friday 04/26/24 in the vascular Lab. Will continue to monitor patients kidney status. I discussed again this morning in detail with the patient, his wife, the procedure, benefits, risks and complication. They all wish to proceed. Patient will be made NPO after midnight tonight.    Continue ASA 81 MG Daily and Plavix  75 mg Daily.    DVT prophylaxis:  ASA 81 mg and Plavix  75 mg    Burgandy Hackworth [facility] 04/25/2024 12:08 PM

## 2024-04-25 NOTE — H&P (View-Only) (Signed)
 Progress Note    04/25/2024 12:08 PM 6 Days Post-Op  Subjective:  Nathan Rivera is a 78 yo male now POD #3 From:   Procedure(s) Performed:             1.  Ultrasound guidance for vascular access right femoral artery             2.  Catheter placement into right renal artery from right femoral approach             3.  Aortogram and selective right renal angiogram             4.  Balloon expandable stent placement to the right renal artery with a 7 mm diameter x 26 mm length Lifestream stent             5.  StarClose closure device right femoral artery   The patient is a 78 year old male with severe hypertension and ischemic nephropathy with a previous CT scan suggesting severe bilateral renal artery stenosis with worsening severe hypertension despite multiple home medications. The patient has suboptimal blood pressure control despite multiple antihypertensives and a noninvasive study demonstrating hemodynamically significant bilateral renal artery stenosis.  He was admitted to the hospital with a hypertensive crisis and also found to have a large type Ib endoleak with his previously repaired abdominal aortic aneurysm as given the clinical scenario and the noninvasive findings, angiogram is indicated for further evaluation of her renal artery and potential treatment.    Vitals:   04/25/24 1000 04/25/24 1100  BP: (!) 149/77 (!) 144/73  Pulse: 64 (!) 59  Resp: 14 15  Temp:    SpO2: 96% 95%   Physical Exam: Cardiac:  Regular Rate but bradycardic at 40-50 bpm's. No murmurs appreciated.  Lungs:  Non labored clear throughout on auscultation.  No rales rhonchi or wheezing. Incisions: Right groin without complication, dressing clean dry and intact.  Extremities: All extremities with palpable pulses and warm to touch. Abdomen: Positive bowel sounds throughout, soft, nontender and nondistended Neurologic: Alert and oriented x 3, answers all questions and follows commands appropriately.  CBC     Component Value Date/Time   WBC 9.3 04/25/2024 0224   RBC 4.08 (L) 04/25/2024 0224   HGB 12.5 (L) 04/25/2024 0224   HGB 10.3 (L) 08/18/2013 0504   HCT 37.0 (L) 04/25/2024 0224   HCT 30.9 (L) 08/18/2013 0504   PLT 163 04/25/2024 0224   PLT 161 08/18/2013 0504   MCV 90.7 04/25/2024 0224   MCV 86 08/18/2013 0504   MCH 30.6 04/25/2024 0224   MCHC 33.8 04/25/2024 0224   RDW 15.2 04/25/2024 0224   RDW 16.0 (H) 08/18/2013 0504   LYMPHSABS 1.5 04/24/2024 0329   LYMPHSABS 2.3 08/18/2013 0504   MONOABS 0.8 04/24/2024 0329   MONOABS 1.1 (H) 08/18/2013 0504   EOSABS 0.6 (H) 04/24/2024 0329   EOSABS 0.2 08/18/2013 0504   BASOSABS 0.0 04/24/2024 0329   BASOSABS 0.0 08/18/2013 0504    BMET    Component Value Date/Time   NA 139 04/25/2024 0224   NA 144 10/23/2014 1630   K 4.0 04/25/2024 0224   K 4.1 10/23/2014 1630   CL 109 04/25/2024 0224   CL 112 (H) 10/23/2014 1630   CO2 21 (L) 04/25/2024 0224   CO2 26 10/23/2014 1630   GLUCOSE 76 04/25/2024 0224   GLUCOSE 83 10/23/2014 1630   BUN 33 (H) 04/25/2024 0224   BUN 30 (H) 10/23/2014 1630   CREATININE 1.91 (H)  04/25/2024 0224   CREATININE 1.96 (H) 10/23/2014 1630   CALCIUM 8.7 (L) 04/25/2024 0224   CALCIUM 9.0 10/23/2014 1630   GFRNONAA 35 (L) 04/25/2024 0224   GFRNONAA 36 (L) 10/23/2014 1630   GFRNONAA 8 (L) 08/18/2013 0504   GFRAA 39 (L) 10/25/2014 0352   GFRAA 44 (L) 10/23/2014 1630   GFRAA 9 (L) 08/18/2013 0504    INR    Component Value Date/Time   INR 1.1 04/23/2024 0310   INR 1.5 10/20/2014 0833     Intake/Output Summary (Last 24 hours) at 04/25/2024 1208 Last data filed at 04/25/2024 1108 Gross per 24 hour  Intake 313.43 ml  Output 1450 ml  Net -1136.57 ml     Assessment/Plan:  78 y.o. male is s/p See Above  6 Days Post-Op   PLAN Patients kidney status continue to improve. Bun/Creatine  33/1.91 Keep Blood Pressure Systolic at 130-140. Okay to use Cleviprex  OOB to chair and ambulate in halls AAA repair  planned for Friday 04/26/24 in the vascular Lab. Will continue to monitor patients kidney status. I discussed again this morning in detail with the patient, his wife, the procedure, benefits, risks and complication. They all wish to proceed. Patient will be made NPO after midnight tonight.    Continue ASA 81 MG Daily and Plavix  75 mg Daily.    DVT prophylaxis:  ASA 81 mg and Plavix  75 mg    Burgandy Hackworth [facility] 04/25/2024 12:08 PM

## 2024-04-25 NOTE — Progress Notes (Signed)
 Occupational Therapy Treatment Patient Details Name: Nathan Rivera MRN: [id number] DOB: [birth year] Today's Date: 04/25/2024   History of present illness Pt is a 78 y.o male with PMH that includes: Infrarenal abdominal aortic aneurysm s/p endovascular repair of a ruptured AAA in 2011 on Xarelto , PAD, HTN, duodenal ulcer with perforation s/p omental patch repair, CKD stage III, thrombocytopenia, HDL, prior tobacco use, ESBL bacteremia, bladder cancer, chronic back pain, and bilateral inguinal hernia s/p repair who presented to the ED with chief complaints of abdominal pain and hemoptysis.  MD assessment includes: hemoptysis, endoleak with aneurysm expansion, HTN emergency, AKI on CKD, CAP, and mild cognitive impairment.   OT comments  Chart reviewed to date, pt greeted in care of PT, appears restless and impulsive and requires multi modal cues for safe mobility. OT tx session targeted improving functional activity tolerance in prep for ADL tasks. Pt requires MIN A +2 for amb with RW, frequent multi modal cues for safety/technique. He performs grooming tasks with supervision. Pt is making progress towards goals, OT will continue to follow.       If plan is discharge home, recommend the following:  A little help with walking and/or transfers;A little help with bathing/dressing/bathroom;Direct supervision/assist for medications management;Direct supervision/assist for financial management;Assist for transportation;Supervision due to cognitive status;Assistance with cooking/housework   Equipment Recommendations  Other (comment) (pt has recommended equipment at this time)    Recommendations for Other Services      Precautions / Restrictions Precautions Precautions: Fall Recall of Precautions/Restrictions: Impaired Restrictions Weight Bearing Restrictions Per Provider Order: No       Mobility Bed Mobility               General bed mobility comments: NT in recliner pre/post session     Transfers Overall transfer level: Needs assistance Equipment used: Rolling walker (2 wheels) Transfers: Sit to/from Stand Sit to Stand: Min assist, +2 safety/equipment, +2 physical assistance                 Balance Overall balance assessment: Needs assistance Sitting-balance support: Feet supported Sitting balance-Leahy Scale: Good     Standing balance support: Bilateral upper extremity supported, During functional activity, Reliant on assistive device for balance Standing balance-Leahy Scale: Fair                             ADL either performed or assessed with clinical judgement   ADL Overall ADL's : Needs assistance/impaired     Grooming: Wash/dry Buyer, retail: Ambulation;Rolling walker (2 wheels);Minimal assistance;+2 for physical assistance;+2 for safety/equipment           Functional mobility during ADLs: Minimal assistance;Rolling walker (2 wheels);+2 for physical assistance;+2 for safety/equipment (approx 75') General ADL Comments: frequent multi modal cues for safety    Extremity/Trunk Assessment              Vision       Perception     Praxis     Communication Communication Communication: Impaired Factors Affecting Communication: Difficulty expressing self   Cognition Arousal: Alert Behavior During Therapy: Restless, Anxious, Impulsive Cognition: Cognition impaired   Orientation impairments: Time, Situation, Place Awareness: Intellectual awareness impaired, Online awareness impaired Memory impairment (select all impairments): Short-term memory, Declarative long-term memory, Non-declarative long-term memory, Working memory Attention impairment (select first level of impairment): Focused attention Executive functioning impairment (  select all impairments): Reasoning, Problem solving, Sequencing, Initiation, Organization OT - Cognition Comments: wife is not present on  this date                 Following commands: Impaired Following commands impaired: Follows one step commands inconsistently, Follows one step commands with increased time      Cueing   Cueing Techniques: Verbal cues, Gestural cues, Tactile cues  Exercises      Shoulder Instructions       General Comments after amb BP 154/72, but after approx 5 minutes 131/77; spo2 >90% on RA throughout    Pertinent Vitals/ Pain       Pain Assessment Pain Assessment: PAINAD Breathing: normal Negative Vocalization: none Facial Expression: sad, frightened, frown Body Language: relaxed Consolability: distracted or reassured by voice/touch PAINAD Score: 2  Home Living                                          Prior Functioning/Environment              Frequency  Min 3X/week        Progress Toward Goals  OT Goals(current goals can now be found in the care plan section)  Progress towards OT goals: Progressing toward goals  Acute Rehab OT Goals Time For Goal Achievement: 05/06/24  Plan      Co-evaluation    PT/OT/SLP Co-Evaluation/Treatment: Yes Reason for Co-Treatment: Necessary to address cognition/behavior during functional activity;For patient/therapist safety;To address functional/ADL transfers PT goals addressed during session: Mobility/safety with mobility;Balance;Proper use of DME OT goals addressed during session: ADL's and self-care      AM-PAC OT 6 Clicks Daily Activity     Outcome Measure   Help from another person eating meals?: None Help from another person taking care of personal grooming?: A Little Help from another person toileting, which includes using toliet, bedpan, or urinal?: A Little Help from another person bathing (including washing, rinsing, drying)?: A Little Help from another person to put on and taking off regular upper body clothing?: A Little Help from another person to put on and taking off regular lower body  clothing?: A Lot 6 Click Score: 18    End of Session Equipment Utilized During Treatment: Rolling walker (2 wheels)  OT Visit Diagnosis: Unsteadiness on feet (R26.81);Other abnormalities of gait and mobility (R26.89);Repeated falls (R29.6);Muscle weakness (generalized) (M62.81);Other symptoms and signs involving cognitive function   Activity Tolerance Patient tolerated treatment well   Patient Left in chair;with call bell/phone within reach;with chair alarm set   Nurse Communication Mobility status (BP)        Time: 8889-8871 OT Time Calculation (min): 18 min  Charges: OT General Charges $OT Visit: 1 Visit OT Treatments $Therapeutic Activity: 8-22 mins  Therisa Sheffield, OTD OTR/L  04/25/24, 2:02 PM

## 2024-04-25 NOTE — Progress Notes (Signed)
 Physical Therapy Treatment Patient Details Name: Nathan Rivera MRN: [id number] DOB: Aug 02, 1946 Today's Date: 04/25/2024   History of Present Illness Pt is a 78 y.o male with PMH that includes: Infrarenal abdominal aortic aneurysm s/p endovascular repair of a ruptured AAA in 2011 on Xarelto , PAD, HTN, duodenal ulcer with perforation s/p omental patch repair, CKD stage III, thrombocytopenia, HDL, prior tobacco use, ESBL bacteremia, bladder cancer, chronic back pain, and bilateral inguinal hernia s/p repair who presented to the ED with chief complaints of abdominal pain and hemoptysis.  MD assessment includes: hemoptysis, endoleak with aneurysm expansion, HTN emergency, AKI on CKD, CAP, and mild cognitive impairment.    PT Comments  Patient received in recliner, reports he is cold and uncomfortable. Patient is restless, confused, impulsive. He requires +2 min A for lines/safety.  Ambulated ~75 feet with RW and +2 min A. Patient requires mod/max cues for following direction with AD and safety.  He will continue to benefit from skilled PT to improve functional independence and safety with mobility.    If plan is discharge home, recommend the following: Assist for transportation;Help with stairs or ramp for entrance;Two people to help with walking and/or transfers   Can travel by private vehicle     Yes  Equipment Recommendations  None recommended by PT    Recommendations for Other Services       Precautions / Restrictions Precautions Precautions: Fall Recall of Precautions/Restrictions: Impaired Restrictions Weight Bearing Restrictions Per Provider Order: No     Mobility  Bed Mobility               General bed mobility comments: NT patient up in recliner and remained in recliner    Transfers Overall transfer level: Needs assistance Equipment used: Rolling walker (2 wheels) Transfers: Sit to/from Stand Sit to Stand: Min assist           General transfer comment: frequent  multi modal cues for technique    Ambulation/Gait Ambulation/Gait assistance: Min assist, +2 physical assistance, +2 safety/equipment Gait Distance (Feet): 75 Feet Assistive device: Rolling walker (2 wheels) Gait Pattern/deviations: Step-through pattern, Trunk flexed, Decreased stride length, Decreased step length - right, Decreased step length - left Gait velocity: decreased     General Gait Details: Patient required constant cues to stay close and inside RW. Cannot follow through. +2 assist for lines/safety. Seems a bit more confused, restless, impulsive this session.   Stairs             Wheelchair Mobility     Tilt Bed    Modified Rankin (Stroke Patients Only)       Balance Overall balance assessment: Needs assistance Sitting-balance support: Feet supported Sitting balance-Leahy Scale: Good     Standing balance support: Bilateral upper extremity supported, During functional activity, Reliant on assistive device for balance Standing balance-Leahy Scale: Fair Standing balance comment: Heavy reliant on RW for static standing, slight forward flexion noted                            Communication Communication Communication: Impaired Factors Affecting Communication: Difficulty expressing self  Cognition Arousal: Alert Behavior During Therapy: Restless, Anxious   PT - Cognitive impairments: History of cognitive impairments, Memory, Problem solving, Safety/Judgement   Orientation impairments: Time, Situation, Place                     Following commands: Impaired Following commands impaired: Follows one step commands inconsistently, Follows  one step commands with increased time    Cueing Cueing Techniques: Verbal cues, Gestural cues, Tactile cues  Exercises      General Comments        Pertinent Vitals/Pain Pain Assessment Pain Assessment: No/denies pain    Home Living                          Prior Function             PT Goals (current goals can now be found in the care plan section) Acute Rehab PT Goals Patient Stated Goal: To get stronger PT Goal Formulation: With patient/family Time For Goal Achievement: 05/03/24 Potential to Achieve Goals: Good Progress towards PT goals: Progressing toward goals    Frequency    Min 3X/week      PT Plan      Co-evaluation PT/OT/SLP Co-Evaluation/Treatment: Yes Reason for Co-Treatment: Necessary to address cognition/behavior during functional activity;For patient/therapist safety;To address functional/ADL transfers PT goals addressed during session: Mobility/safety with mobility;Balance;Proper use of DME        AM-PAC PT 6 Clicks Mobility   Outcome Measure  Help needed turning from your back to your side while in a flat bed without using bedrails?: A Little Help needed moving from lying on your back to sitting on the side of a flat bed without using bedrails?: A Little Help needed moving to and from a bed to a chair (including a wheelchair)?: A Little Help needed standing up from a chair using your arms (e.g., wheelchair or bedside chair)?: A Little Help needed to walk in hospital room?: A Lot Help needed climbing 3-5 steps with a railing? : A Lot 6 Click Score: 16    End of Session Equipment Utilized During Treatment: Oxygen Activity Tolerance: Patient tolerated treatment well Patient left: in chair;with call bell/phone within reach;with chair alarm set Nurse Communication: Mobility status PT Visit Diagnosis: Other abnormalities of gait and mobility (R26.89);Muscle weakness (generalized) (M62.81);Difficulty in walking, not elsewhere classified (R26.2);Unsteadiness on feet (R26.81)     Time: 8892-8875 PT Time Calculation (min) (ACUTE ONLY): 17 min  Charges:    $Gait Training: 8-22 mins PT General Charges $$ ACUTE PT VISIT: 1 Visit                     Nathaneil Feagans, PT, GCS 04/25/24,12:44 PM

## 2024-04-26 ENCOUNTER — Encounter
Admission: EM | Disposition: A | Discharge status: Home Health | Payer: Self-pay | Source: Home / Self Care | Attending: Student in an Organized Health Care Education/Training Program

## 2024-04-26 ENCOUNTER — Inpatient Hospital Stay: Admitting: Anesthesiology

## 2024-04-26 ENCOUNTER — Encounter: Payer: Self-pay | Admitting: Vascular Surgery

## 2024-04-26 ENCOUNTER — Encounter: Admitting: Anesthesiology

## 2024-04-26 DIAGNOSIS — T82310A Breakdown (mechanical) of aortic (bifurcation) graft (replacement), initial encounter: Secondary | ICD-10-CM | POA: Diagnosis not present

## 2024-04-26 DIAGNOSIS — R042 Hemoptysis: Secondary | ICD-10-CM | POA: Diagnosis not present

## 2024-04-26 DIAGNOSIS — N19 Unspecified kidney failure: Secondary | ICD-10-CM

## 2024-04-26 HISTORY — PX: ENDOVASCULAR STENT GRAFT (AAA): CATH118280

## 2024-04-26 LAB — CBC
HCT: 37.7 % — ABNORMAL LOW (ref 39.0–52.0)
Hemoglobin: 12.7 g/dL — ABNORMAL LOW (ref 13.0–17.0)
MCH: 30.8 pg (ref 26.0–34.0)
MCHC: 33.7 g/dL (ref 30.0–36.0)
MCV: 91.3 fL (ref 80.0–100.0)
Platelets: 194 K/uL (ref 150–400)
RBC: 4.13 MIL/uL — ABNORMAL LOW (ref 4.22–5.81)
RDW: 15.1 % (ref 11.5–15.5)
WBC: 9.6 K/uL (ref 4.0–10.5)
nRBC: 0 % (ref 0.0–0.2)

## 2024-04-26 LAB — RENAL FUNCTION PANEL
Albumin: 3 g/dL — ABNORMAL LOW (ref 3.5–5.0)
Anion gap: 8 (ref 5–15)
BUN: 33 mg/dL — ABNORMAL HIGH (ref 8–23)
CO2: 23 mmol/L (ref 22–32)
Calcium: 8.6 mg/dL — ABNORMAL LOW (ref 8.9–10.3)
Chloride: 109 mmol/L (ref 98–111)
Creatinine, Ser: 1.94 mg/dL — ABNORMAL HIGH (ref 0.61–1.24)
GFR, Estimated: 35 mL/min — ABNORMAL LOW (ref >60)
Glucose, Bld: 83 mg/dL (ref 70–99)
Phosphorus: 2.8 mg/dL (ref 2.5–4.6)
Potassium: 3.9 mmol/L (ref 3.5–5.1)
Sodium: 140 mmol/L (ref 135–145)

## 2024-04-26 LAB — GLUCOSE, CAPILLARY
Glucose-Capillary: 126 mg/dL — ABNORMAL HIGH (ref 70–99)
Glucose-Capillary: 181 mg/dL — ABNORMAL HIGH (ref 70–99)
Glucose-Capillary: 216 mg/dL — ABNORMAL HIGH (ref 70–99)
Glucose-Capillary: 82 mg/dL (ref 70–99)
Glucose-Capillary: 83 mg/dL (ref 70–99)

## 2024-04-26 LAB — MAGNESIUM: Magnesium: 2.2 mg/dL (ref 1.7–2.4)

## 2024-04-26 LAB — PREPARE RBC (CROSSMATCH)

## 2024-04-26 MED ORDER — HEPARIN SODIUM (PORCINE) 1000 UNIT/ML IJ SOLN
INTRAMUSCULAR | Status: DC | PRN
  Administered 2024-04-26: 2000 [IU] via INTRAVENOUS
  Administered 2024-04-26: 7000 [IU] via INTRAVENOUS

## 2024-04-26 MED ORDER — OXYCODONE HCL 5 MG/5ML PO SOLN: 5.0000 mg | Freq: Once | ORAL | Status: DC | PRN

## 2024-04-26 MED ORDER — CLEVIDIPINE BUTYRATE 0.5 MG/ML IV EMUL
0.0000 mg/h | INTRAVENOUS | Status: DC
  Administered 2024-04-26: 9 mg/h via INTRAVENOUS
  Administered 2024-04-26: 2 mg/h via INTRAVENOUS
  Filled 2024-04-26: qty 100

## 2024-04-26 MED ORDER — PROPOFOL 10 MG/ML IV BOLUS
INTRAVENOUS | Status: DC | PRN
Start: 2024-04-26 — End: 2024-04-26
  Administered 2024-04-26: 120 mg via INTRAVENOUS

## 2024-04-26 MED ORDER — METHYLPREDNISOLONE SODIUM SUCC 125 MG IJ SOLR: 125.0000 mg | Freq: Once | INTRAMUSCULAR | Status: DC | PRN

## 2024-04-26 MED ORDER — SODIUM CHLORIDE 0.9 % IV SOLN: INTRAVENOUS | Status: DC

## 2024-04-26 MED ORDER — FAMOTIDINE 20 MG PO TABS: 40.0000 mg | ORAL_TABLET | Freq: Once | ORAL | Status: DC | PRN

## 2024-04-26 MED ORDER — PROPOFOL 10 MG/ML IV BOLUS
INTRAVENOUS | Status: AC
Start: 2024-04-26 — End: 2024-04-26
  Filled 2024-04-26: qty 20

## 2024-04-26 MED ORDER — HEPARIN (PORCINE) IN NACL 1000-0.9 UT/500ML-% IV SOLN
INTRAVENOUS | Status: DC | PRN
  Administered 2024-04-26: 1500 mL

## 2024-04-26 MED ORDER — CEFAZOLIN SODIUM-DEXTROSE 2-4 GM/100ML-% IV SOLN
INTRAVENOUS | Status: AC
  Filled 2024-04-26: qty 100

## 2024-04-26 MED ORDER — FENTANYL CITRATE (PF) 100 MCG/2ML IJ SOLN
25.0000 ug | INTRAMUSCULAR | Status: DC | PRN
  Administered 2024-04-26 (×2): 50 ug via INTRAVENOUS

## 2024-04-26 MED ORDER — CHLORHEXIDINE GLUCONATE CLOTH 2 % EX PADS
6.0000 | MEDICATED_PAD | Freq: Every day | CUTANEOUS | Status: DC
  Administered 2024-04-26 – 2024-04-30 (×5): 6 via TOPICAL

## 2024-04-26 MED ORDER — IODIXANOL 320 MG/ML IV SOLN
INTRAVENOUS | Status: DC | PRN
  Administered 2024-04-26: 80 mL via INTRA_ARTERIAL

## 2024-04-26 MED ORDER — PROPOFOL 10 MG/ML IV BOLUS
INTRAVENOUS | Status: AC
  Filled 2024-04-26: qty 20

## 2024-04-26 MED ORDER — OXYCODONE HCL 5 MG PO TABS: 5.0000 mg | ORAL_TABLET | Freq: Once | ORAL | Status: DC | PRN

## 2024-04-26 MED ORDER — DEXAMETHASONE SODIUM PHOSPHATE 10 MG/ML IJ SOLN
INTRAMUSCULAR | Status: DC | PRN
  Administered 2024-04-26: 10 mg via INTRAVENOUS

## 2024-04-26 MED ORDER — SODIUM CHLORIDE 0.9% FLUSH
3.0000 mL | Freq: Two times a day (BID) | INTRAVENOUS | Status: DC
  Administered 2024-04-26 – 2024-04-30 (×8): 3 mL via INTRAVENOUS

## 2024-04-26 MED ORDER — DIPHENHYDRAMINE HCL 50 MG/ML IJ SOLN: 50.0000 mg | Freq: Once | INTRAMUSCULAR | Status: DC | PRN

## 2024-04-26 MED ORDER — ROCURONIUM BROMIDE 100 MG/10ML IV SOLN
INTRAVENOUS | Status: DC | PRN
  Administered 2024-04-26: 30 mg via INTRAVENOUS
  Administered 2024-04-26: 50 mg via INTRAVENOUS

## 2024-04-26 MED ORDER — SODIUM CHLORIDE 0.9% FLUSH: 3.0000 mL | INTRAVENOUS | Status: DC | PRN

## 2024-04-26 MED ORDER — SEVOFLURANE IN SOLN
RESPIRATORY_TRACT | Status: AC
Start: 2024-04-26 — End: 2024-04-26
  Filled 2024-04-26: qty 250

## 2024-04-26 MED ORDER — FENTANYL CITRATE (PF) 100 MCG/2ML IJ SOLN
INTRAMUSCULAR | Status: DC | PRN
  Administered 2024-04-26: 100 ug via INTRAVENOUS

## 2024-04-26 MED ORDER — LIDOCAINE HCL (CARDIAC) PF 100 MG/5ML IV SOSY
PREFILLED_SYRINGE | INTRAVENOUS | Status: DC | PRN
  Administered 2024-04-26: 100 mg via INTRAVENOUS

## 2024-04-26 MED ORDER — CEFAZOLIN SODIUM-DEXTROSE 2-4 GM/100ML-% IV SOLN
2.0000 g | INTRAVENOUS | Status: AC
  Administered 2024-04-26: 2 g via INTRAVENOUS
  Filled 2024-04-26: qty 100

## 2024-04-26 MED ORDER — MIDAZOLAM HCL 2 MG/ML PO SYRP
8.0000 mg | ORAL_SOLUTION | Freq: Once | ORAL | Status: DC | PRN
  Filled 2024-04-26: qty 5

## 2024-04-26 MED ORDER — PHENYLEPHRINE HCL-NACL 20-0.9 MG/250ML-% IV SOLN
INTRAVENOUS | Status: AC
  Filled 2024-04-26: qty 250

## 2024-04-26 MED ORDER — FENTANYL CITRATE (PF) 100 MCG/2ML IJ SOLN
INTRAMUSCULAR | Status: AC
Start: 2024-04-26 — End: 2024-04-26
  Filled 2024-04-26: qty 2

## 2024-04-26 MED ORDER — ALBUMIN HUMAN 25 % IV SOLN: 25.0000 g | Freq: Once | INTRAVENOUS | Status: DC

## 2024-04-26 MED ORDER — ACETAMINOPHEN 10 MG/ML IV SOLN
1000.0000 mg | Freq: Once | INTRAVENOUS | Status: DC | PRN
Start: 2024-04-26 — End: 2024-04-26

## 2024-04-26 MED ORDER — FENTANYL CITRATE PF 50 MCG/ML IJ SOSY
25.0000 ug | PREFILLED_SYRINGE | INTRAMUSCULAR | Status: DC | PRN
  Administered 2024-04-26 – 2024-04-27 (×3): 25 ug via INTRAVENOUS
  Filled 2024-04-26 (×3): qty 1

## 2024-04-26 MED ORDER — ONDANSETRON HCL 4 MG/2ML IJ SOLN
INTRAMUSCULAR | Status: DC | PRN
  Administered 2024-04-26: 4 mg via INTRAVENOUS

## 2024-04-26 MED ORDER — TRAMADOL HCL 50 MG PO TABS: 50.0000 mg | ORAL_TABLET | Freq: Four times a day (QID) | ORAL | Status: DC | PRN

## 2024-04-26 MED ORDER — SUGAMMADEX SODIUM 200 MG/2ML IV SOLN
INTRAVENOUS | Status: DC | PRN
  Administered 2024-04-26: 200 mg via INTRAVENOUS

## 2024-04-26 MED ORDER — SODIUM CHLORIDE 0.9 % IV SOLN: INTRAVENOUS | Status: AC

## 2024-04-26 MED ORDER — SODIUM CHLORIDE 0.9 % IV SOLN: 250.0000 mL | INTRAVENOUS | Status: AC | PRN

## 2024-04-26 MED ORDER — DROPERIDOL 2.5 MG/ML IJ SOLN: 0.6250 mg | Freq: Once | INTRAMUSCULAR | Status: DC | PRN

## 2024-04-26 MED ORDER — FENTANYL CITRATE (PF) 100 MCG/2ML IJ SOLN
INTRAMUSCULAR | Status: AC
  Filled 2024-04-26: qty 2

## 2024-04-26 SURGICAL SUPPLY — 1 items: NDL ENTRY 21GA 7CM ECHOTIP (NEEDLE) IMPLANT

## 2024-04-26 NOTE — Anesthesia Procedure Notes (Signed)
 Procedure Name: Intubation Date/Time: 04/26/2024 8:23 AM  Performed by: Belinda, Deen Deguia, CRNAPre-anesthesia Checklist: Patient identified, Emergency Drugs available, Suction available and Patient being monitored Patient Re-evaluated:Patient Re-evaluated prior to induction Oxygen Delivery Method: Circle system utilized Preoxygenation: Pre-oxygenation with 100% oxygen Induction Type: IV induction Ventilation: Mask ventilation without difficulty Laryngoscope Size: McGrath and 4 Grade View: Grade I Tube type: Oral Number of attempts: 1 Airway Equipment and Method: Stylet Placement Confirmation: ETT inserted through vocal cords under direct vision, positive ETCO2 and breath sounds checked- equal and bilateral Secured at: 22 cm Tube secured with: Tape Dental Injury: Teeth and Oropharynx as per pre-operative assessment

## 2024-04-26 NOTE — Op Note (Signed)
 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 Renal insufficiency  Post-operative diagnosis:  Same  Procedure(s) Performed:  1.  Ultrasound-guided access to the femoral arteries bilaterally.  2.  Introduction catheter into aorta bilateral femoral artery approach  3.  Coil embolization of the left internal iliac artery for repair of common iliac artery aneurysm  4.  Extension of the left limb of the existing EVAR into the left external iliac artery for exclusion of type Ib endoleak  5.  Selective injection of the left renal artery  6.  Angioplasty of the left renal artery using a 5 mm balloon  7.  Celt closure of the right femoral artery             8.  Perclose closure of the left common femoral artery in the preclose fashion  Anesthesia: General By endotracheal intubation.  Sheath:  12 French dry seal sheath left common femoral artery retrograde an Ansell sheath as well as a Rabie sheath were telescoped through this sheath for the renal portion. 6 French 11 cm Pinnacle sheath right common femoral artery retrograde  Contrast: 80 cc   Fluoroscopy Time: 43.6 minutes  Indications: Patient presented with multiple medical problems including advanced renal insufficiency as well as incidental finding of a type Ib endoleak.  As he is improved significantly but stabilized he is now undergoing repair.  He recently underwent salvage of his right renal artery with marked improvement in his creatinine.  Attempts at repairing the left renal artery as well as exclusion of his endoleak are planned for this procedure.  Risks and benefits have been reviewed all questions answered patient agrees to proceed  Procedure:  Norleen CHRISTELLA Gula a 78 y.o. male who  was identified and appropriate procedural time out was performed.  The patient was then placed supine on the table and prepped and draped in the usual sterile fashion.  Ultrasound was used to evaluate the left common femoral artery.  It was echolucent and pulsatile indicating it is patent .  An ultrasound image was acquired for the permanent record.  A micropuncture needle was used to access the left common femoral artery under direct ultrasound guidance.  The microwire was then advanced under fluoroscopic guidance without difficulty followed by the micro-sheath.  A 0.035 J wire was advanced without resistance and a 6 Fr sheath was placed.  2 Perclose devices were used in the preclose fashion and the sheath was upsized to an 8 [place] sheath.  In a similar fashion using ultrasound the right femoral artery was visualized.  It was echolucent and pulsatile indicating patency.  Under direct ultrasound visualization after an image of been saved a Seldinger needle was inserted followed by a J-wire.  A 6 French sheath was then inserted.  J-wire and a pigtail catheter were then negotiated into the main body of the EVAR.  Working from the left side, several oblique views of the iliac bifurcation were then obtained ultimately the origin of the left internal iliac artery was identified.  Attempts at finding the origin and selecting the internal iliac artery initially were unsuccessful and I elected to advance the Kumpe catheter and Glidewire into the main body of the existing EVAR.  The Glidewire was removed and an Amplatz wire placed.  The  12 French sheath was then inserted and positioned just below the iliac bifurcation.  Using a Kumpe catheter and a Glidewire I was able to select the internal iliac artery.  Hand-injection of contrast verified positioning within the internal iliac artery and then a Progreat catheter was advanced through the Kumpe catheter into the more distal internal iliac artery third order catheter  placement.  Hand-injection of contrast verified this.  I then placed 5 Ruby coils beginning with a 9 mm x 30 cm standard and then a 10 mm x 35 cm soft followed by two 30 cm packing coils and then finally a 12 mm x 40 cm standard coil.  Follow-up imaging demonstrated an excellent result with minimal flow and the Kumpe catheter and Progreat were removed.  The marker pigtail catheter was then advanced up the Amplatz wire and appropriate length measurements were made.  Initially a 14 x 14 limb was selected and this was deployed beginning in the left iliac limb just below the flow divider.  A second stent was required to extend into the mid external iliac and this was a 12 x 14 stent.  Both stents were then post dilated with a Coda balloon.  Follow-up imaging through the pigtail catheter on the right now demonstrated excellent seal with wide patency.  Attention was now shifted to the left renal artery.  With the pigtail catheter positioned slightly higher bolus injection of contrast was performed.  There was faint reconstitution of the left renal artery but essentially it appeared to be occluded at its origin.  Using a V S1 catheter and a stiff angled Glidewire was able to negotiate the wire into the renal artery.  The V S1 catheter would not track and it was exchanged for a 4 [place] glide catheter which did track into the renal artery where hand-injection of contrast did verify the renal artery anatomy.  A supra core wire was then advanced through the glide catheter and positioned with its tip as far distally as could be negotiated.  Initially the Lifestream stent would not track across the ostia because the lesion was so tight.  However, a 5 mm x 40 mm Ultraverse balloon did track across the ostia and was inflated to 10 atm for approximately 1 minute.  Follow-up attempts at advancing the stent was still not successful several different maneuvers were then tried including using an Ansell sheath to try to cross the  ostia as well as a Rabie sheath.  Ultimately the wire did buckle.  Numerous attempts using both the Kumpe catheter as well as a V S1 catheter and several different wires were not successful at regaining access.  Given that we had used a moderate amount of contrast and had reached almost 50 minutes of fluoroscopy I felt that this was the proper time to terminate the case.  The wires and catheters were removed bilaterally.  The Perclose devices were secured on the left and a Celt device was deployed on the right.  Findings:   Aortogram: Initial imaging demonstrates an extensive type Ib endoleak with a 5 cm common iliac artery aneurysm.  Internal iliac is widely patent.  The left internal iliac is successfully coiled and the endoleak treated with 2 iliac extender limbs as described above with successful exclusion of the endoleak.  Initial imaging of the renal artery demonstrates essentially occlusion at its origin with faint filling distally.  Angioplasty was successful however I was unable to negotiate the stent across the lesion but given the length  of the case and the contrast exposure with his known renal insufficiency I elected to terminate the case at this time and we will readdress the left renal artery at a later date if indicated.   Disposition: Patient was taken to the recovery room in stable condition having tolerated the procedure well.  Cordella Adya Wirz 04/26/2024,11:28 AM

## 2024-04-26 NOTE — Op Note (Signed)
 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  Renal artery stenosis.  Renovascular hypertension.  Ischemic nephropathy  POST-OPERATIVE DIAGNOSIS: same as above  PROCEDURE: 1.   Ultrasound guidance for vascular access bilateral femoral arteries 2.   Catheter placement to left internal iliac artery with selective left internal iliac artery angiography from left femoral approach 3.   Catheter placement to left renal artery from left femoral approach with selective left renal angiogram 4.   Coil embolization of the left internal iliac artery with Ruby coils 5.   Delayed endovascular repair of abdominal aortic aneurysm with extension of the left iliac limb with a 14 mm diameter by 14 cm length limb and a 12 mm diameter by 14 cm length limb into the mid to distal left external iliac artery 6.   Angioplasty of the left renal artery with 5 mm diameter angioplasty balloon  SURGEONS: Selinda Gu, MD and Cordella Shawl, MD, co-surgeons  ASSISTANT(S): None  ANESTHESIA: general  ESTIMATED BLOOD LOSS: 50 cc  FINDING(S): 1.  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  Previously placed right renal stent remained widely patent 3.  Left renal artery with near occlusive stenosis   INDICATIONS:   Nathan Rivera is a 78 y.o. male who presents with a litany of ongoing issues including ischemic nephropathy and hypertensive crisis with bilateral renal artery stenosis or occlusion.  He has previously undergone right renal artery stenting with good result but continues to have hypertension and renal insufficiency.  His left renal artery was stenotic or occluded.  He also had significant growth of his previously  repaired abdominal aortic aneurysm secondary to a type Ib endoleak in the left iliac artery.  The left common iliac arteries had marked aneurysmal degeneration and there is no longer seal distally.  This will require extension down to the left external iliac artery to repair..  Risks and benefits were discussed and informed consent was obtained.  Co-surgeons are required due to the bilateral nature of the procedure and the complexity of the procedure.  DESCRIPTION: After obtaining full informed written consent, the patient was brought back to the operating room and placed supine upon the operating table.  The patient was prepped and draped in the standard fashion.  Dr. Shawl worked on the left and I worked on the right.  Co-surgeons were used due to the complexity of the procedure.  We began by gaining access to the femoral arteries bilaterally.  Ultrasound was used to visualize the right femoral artery which was widely patent and it was accessed under direct ultrasound guidance without difficulty with a Seldinger needle.  A permanent image was recorded.  6 French sheath was placed on the right.  Similarly, the left femoral artery was visualized and found to be widely patent.  It was accessed under direct ultrasound guidance with a micropuncture needle.  A micropuncture wire and sheath were then placed.  On the left side, we upsized to a 6 [place] sheath initially and then placed 2 Perclose devices in a preclose fashion.  We then upsized first to an 8 and then a 12 mm sheath.  The patient was systemically heparinized.  Imaging was performed from a pigtail catheter placed up the right side as well as  retrograde imaging from the left femoral sheath.  The left iliac bifurcation was difficult to splay out initially due to the aneurysmal degeneration of the left common iliac artery, but ultimately in a steep oblique and cranial projection we were able to visualize the left iliac bifurcation.  Dr. Jama then  cannulated the left internal iliac artery and perform selective imaging with a rim catheter.  A Progreat microcatheter was then taken well off the left internal iliac artery and coil embolization was performed with a series of Ruby coils ranging from 10 mm in diameter up to 14 mm in diameter and 2 packing coils were placed as well.  Successful coil embolization of the left internal iliac artery was then confirmed with selective imaging and we turned our attention to fixing the aneurysm by extending the limbs further down.  Through the 12 French sheath, a 14 mm diameter by 14 cm length limb was initially deployed from the previous limb just above the aortic bifurcation down to the iliac bifurcation.  An additional 12 mm diameter by 14 cm length left iliac extension limb was taken down to the mid to distal left external iliac artery.  The compliant balloon was used to iron out seal points and junction zones.  Completion imaging through the pigtail catheter showed the stent graft limb now to be widely patent and there is no longer a type Ib endoleak. Dr. Jama then turned his attention to cannulating the left renal artery which was done with some difficulty initially with a V S1 catheter and a Glidewire.  Selective imaging showed a near total occlusion of the proximal left renal artery.  This was an extremely tight lesion and getting catheter to cross this was very difficult.  Ultimately a glide catheter and a supra core wire were able to be advanced out into the main right renal artery branches.  To try to help support a 7 Jamaica Rabie sheath was placed through the 12 [place] dry seal.  Attempts at placing a 6 mm diameter by 16 cm length balloon expandable stent in the left renal artery were unsuccessful due to the tight lesion and the severe angle of the origin of the left renal artery.  The stent was removed and a 5 mm diameter by 4 cm length balloon was then selected in the proximal left renal artery.  This was  inflated to 10 atm for 1 minute.  Completion imaging showed greater than 50% residual stenosis.  Despite multiple attempts with several different wires and catheters, we were never able to get a stent out across the lesion in the left renal artery.  Due to contrast limitations from his renal insufficiency as well as fluoroscopy time, when it was clear this was no longer going to be successful we abandon this approach and would have to consider repair at a later date if he has further issues. At this point, we elected to complete the procedure.  On the right side, a Celt closure device was deployed in the right femoral artery with excellent hemostatic result.  On the left side, the Pro-glide devices were secured down and hemostasis was achieved in the left femoral artery.  4-0 Monocryl was used to close the skin incision and Dermabond was placed.  The patient was taken to the recovery room in stable condition.   COMPLICATIONS: None  CONDITION: Stable  Selinda Gu  04/26/2024, 11:29 AM    This note was created with Dragon Medical transcription system. Any errors in dictation are  purely unintentional.

## 2024-04-26 NOTE — Progress Notes (Signed)
 Patient was in surgery when we came to see the patient.  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  He had extensive repair performed today.  He also had angioplasty of the left renal artery today.  Continue to monitor for worsening renal function given the fact that he had contrast exposure today.  80 cc of contrast was used.

## 2024-04-26 NOTE — Anesthesia Postprocedure Evaluation (Signed)
 Anesthesia Post Note  Patient: Nathan Rivera  Procedure(s) Performed: ENDOVASCULAR REPAIR/STENT GRAFT  Patient location during evaluation: PACU Anesthesia Type: General Level of consciousness: awake and alert Pain management: pain level controlled Vital Signs Assessment: post-procedure vital signs reviewed and stable Respiratory status: spontaneous breathing, nonlabored ventilation, respiratory function stable and patient connected to nasal cannula oxygen Cardiovascular status: blood pressure returned to baseline and stable Postop Assessment: no apparent nausea or vomiting Anesthetic complications: no   No notable events documented.   Last Vitals:  Vitals:   04/26/24 1600 04/26/24 1700  BP: 111/66 131/76  Pulse: 78 74  Resp: 19 14  Temp: 36.4 C   SpO2: 97% 99%    Last Pain:  Vitals:   04/26/24 1600  TempSrc: Axillary  PainSc: Asleep                 Camellia Merilee Louder

## 2024-04-26 NOTE — Anesthesia Preprocedure Evaluation (Addendum)
 Anesthesia Evaluation  Patient identified by MRN, date of birth, ID band Patient awake and Patient confused    Reviewed: Allergy & Precautions, H&P , NPO status , Patient's Chart, lab work & pertinent test results  Airway Mallampati: II  TM Distance: >3 FB Neck ROM: full    Dental  (+) Missing   Pulmonary pneumonia (Ceftriaxone  and Azithromycin  for CAP coverage completed course.), Current Smoker and Patient abstained from smoking.   Pulmonary exam normal        Cardiovascular hypertension, (-) angina + Peripheral Vascular Disease    Cleviprex  gtt   Neuro/Psych  PSYCHIATRIC DISORDERS     Dementia CVA    GI/Hepatic negative GI ROS, Neg liver ROS,,,  Endo/Other  negative endocrine ROS    Renal/GU Renal InsufficiencyRenal disease     Musculoskeletal  (+) Arthritis ,    Abdominal  (+) + obese  Peds  Hematology negative hematology ROS (+)   Anesthesia Other Findings 7/21:Admit with Endoleak post Endovascular aneurysm repair (EVAR) and hypertensive crisis requiring cleviprex  and esmolol  gtt   Hypertensive Crisis #Hx of poorly controlled HTN #HLD #Bradycardia at baseline-Hold Aricept  BP on admission SBP>200 without end-organ dysfunction     78 y.o. male presenting with abdominal pain and hemoptysis. PMH significant for Infrarenal abdominal aortic aneurysm s/p endovascular repair of a ruptured AAA in 2011 on Xarelto , PAD, HTN, duodenal ulcer with perforation s/p omental patch repair, CKD stage III, thrombocytopenia, HDL, prior tobacco use, bladder cancer, chronic back pain.     CTA Chest> CT Abd/pelvis> Saccular aneurysm at the level of the proximal stent with some soft tissue thickening and stranding concerning possible endoleak or impending rupture,Large aneurysm involving the left common iliac artery, partially encompasses the left iliac stent, Aneurysmal dilatation of the right common iliac artery at the distal end of  the stent  Multiple small aneurysms of the proximal internal and iliac vessels.   EGD 7/22: EGD without any sign of active or past bleeding. No ulcers or inflammation seen. Consider other source of bleeding.   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 Marea. Right renal artery stent placed.  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   Past Medical History: No date: Alzheimer dementia (HCC) No date: Aortic aneurysm (HCC) No date: Arthritis     Comment:  knees are the biggest problem 2012: Cancer (HCC)     Comment:  bladder 2014: CKD (chronic kidney disease), stage III (HCC)     Comment:  relative to infection, septic- renal calculi No date: Degenerative disc disease No date: Degenerative joint disease No date: GERD (gastroesophageal reflux disease) No date: History of kidney stones No date: Hypercholesterolemia No date: Hypertension     Comment:  no meds currently No date: Impaired glucose tolerance No date: Muscle fatigue     Comment:  muscle fatigue relative to sepsis experience  No date: Peripheral vascular disease (HCC) No date: Pneumonia No date: Raynaud's disease 07/2013: Sepsis (HCC) No date: Stroke [facility]) 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 unsure which leg, treating with Xarelto  08/17/2013: UTI (urinary tract infection)     Comment:  Lt nephrostomy tube and stent placement  Past Surgical History: 2011: ABDOMINAL AORTIC ANEURYSM REPAIR 02/15/2021: CATARACT EXTRACTION W/PHACO; Left     Comment:  Procedure: CATARACT EXTRACTION PHACO AND  INTRAOCULAR               LENS PLACEMENT (IOC) LEFT 5.59 00:44.0;  Surgeon: Myrna Adine Anes, MD;  Location: [facility];                Service: Ophthalmology;  Laterality: Left; 03/08/2021: CATARACT EXTRACTION  W/PHACO; Right     Comment:  Procedure: CATARACT EXTRACTION PHACO AND INTRAOCULAR               LENS PLACEMENT (IOC) RIGHT 1.81 00:22.7;  Surgeon: Myrna Adine Anes, MD;  Location: [facility];                Service: Ophthalmology;  Laterality: Right; 04/16/2024: ESOPHAGOGASTRODUODENOSCOPY; N/A     Comment:  Procedure: EGD (ESOPHAGOGASTRODUODENOSCOPY);  Surgeon:               Jinny Carmine, MD;  Location: [facility] END[facility]COPY;  Service:               Endoscopy;  Laterality: N/A; No date: HERNIA REPAIR     Comment:  73mo old 10/24/2014: INSERTION OF MESH; N/A     Comment:  Procedure: INSERTION OF MESH;  Surgeon: Deward Null III,               MD;  Location: [facility] OR;  Service: General;  Laterality:               N/A; 08/24/2013: LAPAROTOMY; N/A     Comment:  Procedure: EXPLORATORY LAPAROTOMY;  Surgeon: Deward GORMAN Null DOUGLAS, MD;  Location: [facility] OR;  Service: General;                Laterality: N/A;  Gram patch closure 09/24/2013: litrotripsy Nov. 2014: NEPHROSTOMY Dec. 2014: nephrostomy removal No date: PERIPHERALLY INSERTED CENTRAL CATHETER INSERTION 04/19/2024: RENAL ANGIOGRAPHY; Bilateral     Comment:  Procedure: RENAL ANGIOGRAPHY;  Surgeon: Marea Selinda GORMAN,               MD;  Location: [facility] INVASIVE CV LAB;  Service:               Cardiovascular;  Laterality: Bilateral; No date: TONSILLECTOMY No date: UMBILICAL HERNIA REPAIR     Comment:  50 yr ago 10/24/2014: VENTRAL HERNIA REPAIR; N/A     Comment:  Procedure: LAPAROSCOPIC ASSISTED VENTRAL HERNIA REPAIR               WITH MESH;  Surgeon: Deward Null III, MD;  Location: [facility] OR;              Service: General;  Laterality: N/A; 11/17/2022: XI ROBOTIC ASSISTED INGUINAL HERNIA REPAIR WITH MESH;  Bilateral     Comment:  Procedure: XI ROBOTIC ASSISTED INGUINAL HERNIA REPAIR               WITH MESH-Bilateral;  Surgeon: Desiderio Schanz, MD;                Location: [facility] ORS;  Service: General;  Laterality:                Bilateral;  BMI    Body Mass Index: 31.25 kg/m      Reproductive/Obstetrics negative OB ROS  Anesthesia Physical Anesthesia Plan  ASA: 3  Anesthesia Plan: General ETT   Post-op Pain Management: Minimal or no pain anticipated   Induction: Intravenous  PONV Risk Score and Plan: 2 and Ondansetron  and Dexamethasone   Airway Management Planned: Oral ETT  Additional Equipment:   Intra-op Plan:   Post-operative Plan: Extubation in OR  Informed Consent: I have reviewed the patients History and Physical, chart, labs and discussed the procedure including the risks, benefits and alternatives for the proposed anesthesia with the patient or authorized representative who has indicated his/her understanding and acceptance.     Dental Advisory Given and Consent reviewed with POA  Plan Discussed with: CRNA and Surgeon  Anesthesia Plan Comments:          Anesthesia Quick Evaluation

## 2024-04-26 NOTE — Consult Note (Signed)
 PHARMACY CONSULT NOTE - ELECTROLYTES  Pharmacy Consult for Electrolyte Monitoring and Replacement   Recent Labs: Height: 5' 9 (175.3 cm) Weight: 96 kg (211 lb 10.3 oz) IBW/kg (Calculated) : 70.7 Estimated Creatinine Clearance: 35.9 mL/min (A) (by C-G formula based on SCr of 1.94 mg/dL (H)). Potassium (mmol/L)  Date Value  04/26/2024 3.9  10/23/2014 4.1   Magnesium (mg/dL)  Date Value  91/98/7974 2.2   Calcium (mg/dL)  Date Value  91/98/7974 8.6 (L)   Calcium, Total (mg/dL)  Date Value  98/71/7983 9.0   Albumin  (g/dL)  Date Value  91/98/7974 3.0 (L)  08/18/2013 1.4 (L)   Phosphorus (mg/dL)  Date Value  91/98/7974 2.8   Sodium (mmol/L)  Date Value  04/26/2024 140  10/23/2014 144   Assessment  Nathan Rivera is a 78 y.o. male presenting with abdominal pain and hemoptysis. PMH significant for Infrarenal abdominal aortic aneurysm s/p endovascular repair of a ruptured AAA in 2011 on Xarelto , PAD, HTN, duodenal ulcer with perforation s/p omental patch repair, CKD stage III, thrombocytopenia, HDL, prior tobacco use, ESBL bacteremia, bladder cancer, chronic back pain, and bilateral inguinal hernia s/p repair . Pharmacy has been consulted to monitor and replace electrolytes.  MIVF: N/A Pertinent medications: clevidipine  gtt  Goal of Therapy: Electrolytes WNL  Plan:  No electrolyte replacement indicated at this time Renal function panel ordered for tomorrow  Thank you for allowing pharmacy to be a part of this patient's care.  Marolyn KATHEE Mare 04/26/2024 7:58 AM

## 2024-04-26 NOTE — Transfer of Care (Signed)
 Immediate Anesthesia Transfer of Care Note  Patient: Nathan Rivera  Procedure(s) Performed: ENDOVASCULAR REPAIR/STENT GRAFT  Patient Location: PACU  Anesthesia Type:General  Level of Consciousness: awake and alert   Airway & Oxygen Therapy: Patient Spontanous Breathing and Patient connected to nasal cannula oxygen  Post-op Assessment: Report given to RN and Post -op Vital signs reviewed and stable  Post vital signs: Reviewed and stable  Last Vitals:  Vitals Value Taken Time  BP 178/81 04/26/24 14:15  Temp 36 C 04/26/24 11:45  Pulse 76 04/26/24 14:26  Resp 24 04/26/24 14:26  SpO2 96 % 04/26/24 14:26  Vitals shown include unfiled device data.  Last Pain:  Vitals:   04/26/24 1247  TempSrc:   PainSc: Asleep      Patients Stated Pain Goal: 0 (04/25/24 1000)  Complications: No notable events documented.

## 2024-04-26 NOTE — Interval H&P Note (Signed)
 History and Physical Interval Note:  04/26/2024 9:01 AM  Nathan Rivera  has presented today for surgery, with the diagnosis of AAA Endo leak Type 1.  The various methods of treatment have been discussed with the patient and family. After consideration of risks, benefits and other options for treatment, the patient has consented to  Procedure(s): ENDOVASCULAR REPAIR/STENT GRAFT (N/A) as a surgical intervention.  The patient's history has been reviewed, patient examined, no change in status, stable for surgery.  I have reviewed the patient's chart and labs.  Questions were answered to the patient's satisfaction.     Cordella Shawl

## 2024-04-26 NOTE — Progress Notes (Signed)
 NAME:  Nathan Rivera, MRN:  [id number], DOB:  1945/10/03, LOS: 11 ADMISSION DATE:  04/15/2024,  History of Present Illness:  78 y.o male with significant PMH of Infrarenal abdominal aortic aneurysm s/p endovascular repair of a ruptured AAA in 2011 on Xarelto , PAD, HTN, duodenal ulcer with perforation s/p omental patch repair, CKD stage III, thrombocytopenia, HDL, prior tobacco use, ESBL bacteremia, bladder cancer, chronic back pain, and bilateral inguinal hernia s/p repair who presented to the ED with chief complaints of abdominal pain and hemoptysis.   Patient and family at bedside reports that he took his last dose of Xarelto  yesterday evening.  He subsequently developed some abdominal pain around the epigastric and left upper quadrant region.  Since onset of symptoms has been coughing up bright red blood which he describes  blood filling up his mouth.  Denies other associated symptoms   ED Course: Initial vital signs showed HR of 68 beats/minute, BP 192/102 mm Hg, the RR 18 breaths/minute, and the oxygen saturation 93% on RA and a temperature of 98.7F (36.7C). Pertinent Labs/Diagnostics Findings: Na+/ K+:140/4.2 Glucose:94  BUN/Cr.:39/2.43 TAR:[id number]  PCT: negative <0.10  BNP: 394.5  CTA Chest> CT Abd/pelvis> Saccular aneurysm at the level of the proximal stent with some soft tissue thickening and stranding concerning possible endoleak or impending rupture,Large aneurysm involving the left common iliac artery, partially encompasses the left iliac stent, Aneurysmal dilatation of the right common iliac artery at the distal end of the stent  Multiple small aneurysms of the proximal internal and iliac vessels.    CT findings discussed with on-call vascular Dr. Marea who recommended hemodynamic monitoring as it is unlikely patient will require emergent intervention but may require repair during the course of admission. PCCM consulted for admission, BP management and close  monitoring.  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 Aorto bifemoral endograft placement for AAA,  Chronic right-sided low back pain without sciatica;  Thrombocytopenia  Raynaud's disease with gangrene  B12 deficiency;  Personal history of malignant neoplasm of bladder;  Tobacco use disorder;  Hematuria, gross  ESBL Bacteremia due to septic shock due to obstructive stone with Pyelonephritis bilateral Inguinal Hernia s/p repair (02/24)  Significant Hospital Events: Including procedures, antibiotic start and stop dates in addition to other pertinent events   7/21: admit to ICU, on clevidipine  and esmolol  7/22: no recurrence of hemoptysis, but he continues to have a cough. EGD normal without any PUD or sign of GI bleed 7/23: worsening renal function, holding on surgical intervention 7/24: kidney function stable, ileus, N/V in the AM. Sat in chair 7/25: slept well overnight, kidney function improved. No complaints. Underwent renal artery stenting. 7/26: passing flatus, no abdominal pain 7/27: delirium, no pain reported but he is confused today 07/28: Patient complaining of new back pain, appears to be muscular. Remains on clevidipine  and emolol drip for strict bp control.  07/31: Patient remains stable with improved kidney function.   Interim History / Subjective:  Nathan Rivera is doing well and has no major complaints.  Pending surgical intervention tomorrw.   Remains on cleveprex drip with hopes to come off today.  Kidney function stable with Cr. At 1.94mg /dl.   Objective    Blood pressure (!) 138/97, pulse 71, temperature (!) 96.8 F (36 C), resp. rate 17, height 5' 9 (1.753 m), weight  96 kg, SpO2 93%.        Intake/Output Summary (Last 24 hours) at 04/26/2024  1345 Last data filed at 04/26/2024 1230 Gross per 24 hour  Intake 540.41 ml  Output 1170 ml  Net -629.59 ml   Filed Weights   04/24/24 0428 04/25/24 0500 04/26/24 0431  Weight: 97.4 kg 98.1 kg 96 kg    Examination: General: NAD HENT: Supple neck, reactive pupils Lungs: Clear bilateral air entry Cardiovascular: Normal S1, Normal S2, RRR Abdomen: Soft, non tender, non distended, +BS  Extremities: Warm and well perfused no edema.   Labs and imaging were reviewed.   Assessment and Plan  #Hemoptysis #Endoleak with Aneurysm Expansion #History of Infrarenal AAA s/p endovascular repeair s/p R renal artery stenting 07/25 with improvement in AKI. Plan for L renal artery intervention on 07/30.  #HTN emergency #AKI on CKD improving baseline Cr. 2.2 mg/dl.  #Community Acquired Pneumonia #Mild Cognitive Impairment   78 year old male presenting with an episode of hemoptysis (golf ball sized) with concern for hematemesis given history of GI bleed in the past. Patient was found to have a possible endoleak with threat of rupture and is admitted to the ICU for tight blood pressure control. He is s/p renal artery stenting.   Neuro - history of mild cognitive impairment, holding memantine and donepezil  given bradycardia. Continues to have delirium. CV - history of hypertension, and presented with elevated blood pressure. Started on strict HR and BP control given concern for endovascular leak and risk of AAA rupture. He does have significant vascular disease (bilateral severe renal artery stenosis, severe celiac artery stenosis, infrarenal abdominal aortoiliac stent graft, aneurysmal dilation of the abdominal aorta at the level of the renal arteries with a saccular component concerning for leakage vs impending rupture, left common iliac artery aneurysm). Given renal disease and need for contrast, endoleak repair is delayed, and he underwent renal artery stenosis stenting. Continue with goal SBP of 130-140  mmHg and HR < 60.             -titrate clevidipine  for goal SBP of 130-140             -titrate esmolol  for goal HR < 60  - Metoprolol  tartrate 25mg  BID, Amlodipine  10mg  Daily and Hydralazine  50mg  Q6H scheduled. Weaning down drips as possible.  -- Pending endoleak repair today and possible left renal artery stenting.  Pulm - No recurrence of his hemoptysis since his admission. Chest CT with multiple tree in bud nodules as well as minimal ground glass opacities that were scattered, concerning for viral infection versus bacterial pneumonia/bronchitis. He remains on nasal cannula and oxygenation is improved with holding of fluids, mobilization, and incentive spirometry. GI - Underwent EGD that did not show any PUD or sign of GI bleed. Discontinued high dose PPI. CT abdomen with signs of ileus. Tried NG tube but did not tolerate. Bowel sounds are improved, and he has passed flatus. Trial clear liquid diet and ok to advance diet as tolerated. Continue with OOB to chair. NPO after midnight.  Renal - AKI on CKD, with bilateral renal artery stenosis. This was likely exacerbated by contrast exposure. Holding IV fluids and liberalizing BP requirements to allow for kidney perfusion (see CV above). Kidney function improved today.  Endo - ICU glycemic protocol Hem/Onc - on home rivaroxaban , holding given hemoptysis. H/H stable We need to decide on reinitiating in the am.  ID - Viral panel negative, on Ceftriaxone  and Azithromycin  for CAP coverage completed course.  Sputum culture no growth.   Best Practice (right click and Reselect all SmartList Selections daily)   Diet/type: Regular consistency (see orders) after procedure.  DVT prophylaxis not indicated Pressure ulcer(s): N/A GI prophylaxis: N/A Lines: N/A Foley:  N/A Code Status:  full code  Last date of multidisciplinary goals of care discussion [04/26/2024`]  Critical care time: 35 minutes    Darrin Barn, MD [facility] 04/26/2024 1:45 PM

## 2024-04-27 DIAGNOSIS — T82310A Breakdown (mechanical) of aortic (bifurcation) graft (replacement), initial encounter: Secondary | ICD-10-CM | POA: Diagnosis not present

## 2024-04-27 DIAGNOSIS — R042 Hemoptysis: Secondary | ICD-10-CM | POA: Diagnosis not present

## 2024-04-27 LAB — CBC
HCT: 26.1 % — ABNORMAL LOW (ref 39.0–52.0)
HCT: 26.1 % — ABNORMAL LOW (ref 39.0–52.0)
HCT: 24.7 % — ABNORMAL LOW (ref 39.0–52.0)
Hemoglobin: 8.7 g/dL — ABNORMAL LOW (ref 13.0–17.0)
Hemoglobin: 8.5 g/dL — ABNORMAL LOW (ref 13.0–17.0)
Hemoglobin: 8.3 g/dL — ABNORMAL LOW (ref 13.0–17.0)
MCH: 31.1 pg (ref 26.0–34.0)
MCH: 30.4 pg (ref 26.0–34.0)
MCH: 31.1 pg (ref 26.0–34.0)
MCHC: 33.3 g/dL (ref 30.0–36.0)
MCHC: 32.6 g/dL (ref 30.0–36.0)
MCHC: 33.6 g/dL (ref 30.0–36.0)
MCV: 93.2 fL (ref 80.0–100.0)
MCV: 93.2 fL (ref 80.0–100.0)
MCV: 92.5 fL (ref 80.0–100.0)
Platelets: 187 K/uL (ref 150–400)
Platelets: 197 K/uL (ref 150–400)
Platelets: 197 K/uL (ref 150–400)
RBC: 2.8 MIL/uL — ABNORMAL LOW (ref 4.22–5.81)
RBC: 2.8 MIL/uL — ABNORMAL LOW (ref 4.22–5.81)
RBC: 2.67 MIL/uL — ABNORMAL LOW (ref 4.22–5.81)
RDW: 15.5 % (ref 11.5–15.5)
RDW: 15.5 % (ref 11.5–15.5)
RDW: 15.7 % — ABNORMAL HIGH (ref 11.5–15.5)
WBC: 14.5 K/uL — ABNORMAL HIGH (ref 4.0–10.5)
WBC: 15.1 K/uL — ABNORMAL HIGH (ref 4.0–10.5)
WBC: 14.4 K/uL — ABNORMAL HIGH (ref 4.0–10.5)
nRBC: 0 % (ref 0.0–0.2)
nRBC: 0 % (ref 0.0–0.2)
nRBC: 0 % (ref 0.0–0.2)

## 2024-04-27 LAB — RENAL FUNCTION PANEL
Albumin: 2.8 g/dL — ABNORMAL LOW (ref 3.5–5.0)
Anion gap: 10 (ref 5–15)
BUN: 46 mg/dL — ABNORMAL HIGH (ref 8–23)
CO2: 20 mmol/L — ABNORMAL LOW (ref 22–32)
Calcium: 8.2 mg/dL — ABNORMAL LOW (ref 8.9–10.3)
Chloride: 110 mmol/L (ref 98–111)
Creatinine, Ser: 2.69 mg/dL — ABNORMAL HIGH (ref 0.61–1.24)
GFR, Estimated: 23 mL/min — ABNORMAL LOW (ref >60)
Glucose, Bld: 104 mg/dL — ABNORMAL HIGH (ref 70–99)
Phosphorus: 5.1 mg/dL — ABNORMAL HIGH (ref 2.5–4.6)
Potassium: 4.7 mmol/L (ref 3.5–5.1)
Sodium: 140 mmol/L (ref 135–145)

## 2024-04-27 LAB — GLUCOSE, CAPILLARY
Glucose-Capillary: 101 mg/dL — ABNORMAL HIGH (ref 70–99)
Glucose-Capillary: 109 mg/dL — ABNORMAL HIGH (ref 70–99)
Glucose-Capillary: 114 mg/dL — ABNORMAL HIGH (ref 70–99)
Glucose-Capillary: 120 mg/dL — ABNORMAL HIGH (ref 70–99)
Glucose-Capillary: 108 mg/dL — ABNORMAL HIGH (ref 70–99)
Glucose-Capillary: 98 mg/dL (ref 70–99)

## 2024-04-27 LAB — PREPARE RBC (CROSSMATCH)

## 2024-04-27 MED ORDER — LACTATED RINGERS IV SOLN: INTRAVENOUS | Status: AC

## 2024-04-27 NOTE — Evaluation (Signed)
 Occupational Therapy Evaluation Patient Details Name: Nathan Rivera MRN: [id number] DOB: 1945-10-21 Today's Date: 04/27/2024   History of Present Illness   Pt is a 78 y.o male with PMH that includes: Infrarenal abdominal aortic aneurysm s/p endovascular repair of a ruptured AAA in 2011 on Xarelto , PAD, HTN, duodenal ulcer with perforation s/p omental patch repair, CKD stage III, thrombocytopenia, HDL, prior tobacco use, ESBL bacteremia, bladder cancer, chronic back pain, and bilateral inguinal hernia s/p repair who presented to the ED with chief complaints of abdominal pain and hemoptysis.  MD assessment includes: hemoptysis, endoleak with aneurysm expansion, HTN emergency, AKI on CKD, CAP, and mild cognitive impairment. Patient is s/p treatment of type Ib endoleak with position of the left internal iliac artery and extension of the left external iliac artery as well as left renal angiogram and angioplasty 04/26/24.     Clinical Impressions Patient seen for occupational therapy evaluation this date.  Wife present during evaluation and able to provide information regarding patient's functional status.  Prior to admission, pt required supervision for safety and was mostly independent with basic self care tasks.  He lives in multi level home but able to live on main level.  Patient appears confused with difficulty with following one-step commands.  He responds well to tactile and verbal cues with transfers.  Patient presents with muscle weakness, decreased cognition, decreased transfers and functional mobility, decreased ability to perform ADL and IADL tasks.  He would benefit from skilled occupational therapy intervention to maximize his safety and independence and necessary daily tasks at home and in the community as well as to reduce caregiver burden.      If plan is discharge home, recommend the following:   A little help with walking and/or transfers;A little help with  bathing/dressing/bathroom;Direct supervision/assist for medications management;Direct supervision/assist for financial management;Assist for transportation;Supervision due to cognitive status;Assistance with cooking/housework     Functional Status Assessment   Patient has had a recent decline in their functional status and demonstrates the ability to make significant improvements in function in a reasonable and predictable amount of time.     Equipment Recommendations         Recommendations for Other Services         Precautions/Restrictions   Precautions Precautions: Fall Recall of Precautions/Restrictions: Impaired Restrictions Weight Bearing Restrictions Per Provider Order: No     Mobility Bed Mobility Overal bed mobility: Needs Assistance Bed Mobility: Supine to Sit     Supine to sit: Min assist, HOB elevated     General bed mobility comments: Pt required cues to initiate task, inconsistent command following    Transfers Overall transfer level: Needs assistance Equipment used: Rolling walker (2 wheels) Transfers: Sit to/from Stand Sit to Stand: Min assist, +2 physical assistance, +2 safety/equipment           General transfer comment: frequent multi modal cues for technique      Balance Overall balance assessment: Needs assistance Sitting-balance support: Feet supported Sitting balance-Leahy Scale: Good     Standing balance support: Bilateral upper extremity supported, During functional activity, Reliant on assistive device for balance Standing balance-Leahy Scale: Poor Standing balance comment: Heavy reliant on RW for static standing, slight forward flexion noted                           ADL either performed or assessed with clinical judgement   ADL Overall ADL's : Needs assistance/impaired Eating/Feeding: Supervision/ safety;Sitting  Grooming: Wash/dry face;Sitting;Supervision/safety   Upper Body Bathing: Minimal  assistance   Lower Body Bathing: Maximal assistance;Moderate assistance   Upper Body Dressing : Minimal assistance   Lower Body Dressing: Maximal assistance   Toilet Transfer: Ambulation;Rolling walker (2 wheels);Minimal assistance;+2 for physical assistance;+2 for safety/equipment Toilet Transfer Details (indicate cue type and reason): frequent multi modal cues throughout for technique/sequencing         Functional mobility during ADLs: Minimal assistance;Rolling walker (2 wheels);+2 for physical assistance;+2 for safety/equipment General ADL Comments: frequent multi modal cues for safety     Vision Baseline Vision/History: 1 Wears glasses       Perception         Praxis         Pertinent Vitals/Pain Pain Assessment Pain Assessment: Faces Faces Pain Scale: Hurts little more     Extremity/Trunk Assessment Upper Extremity Assessment Upper Extremity Assessment: Generalized weakness;Right hand dominant   Lower Extremity Assessment Lower Extremity Assessment: Defer to PT evaluation   Cervical / Trunk Assessment Cervical / Trunk Assessment: Normal   Communication Communication Communication: Impaired Factors Affecting Communication: Difficulty expressing self   Cognition Arousal: Alert   Cognition: Cognition impaired   Orientation impairments: Time, Situation, Place Awareness: Intellectual awareness impaired, Online awareness impaired Memory impairment (select all impairments): Short-term memory, Declarative long-term memory, Non-declarative long-term memory, Working memory Attention impairment (select first level of impairment): Focused attention Executive functioning impairment (select all impairments): Reasoning, Problem solving, Sequencing, Initiation, Organization                   Following commands: Impaired Following commands impaired: Follows one step commands inconsistently, Follows one step commands with increased time      Cueing  General Comments   Cueing Techniques: Verbal cues;Gestural cues;Tactile cues;Visual cues      Exercises     Shoulder Instructions      Home Living Family/patient expects to be discharged to:: Private residence Living Arrangements: Spouse/significant other Available Help at Discharge: Family;Available 24 hours/day Type of Home: House Home Access: Stairs to enter [employer] of Steps: 3 Entrance Stairs-Rails: Right Home Layout: Two level;Able to live on main level with bedroom/bathroom     Bathroom Shower/Tub: Arts development officer Toilet: Handicapped height     Home Equipment: Agricultural consultant (2 wheels);Rollator (4 wheels);Cane - single point;Shower seat - built in;BSC/3in1   Additional Comments: History and PLOF provided by spouse secondary to pt cognition      Prior Functioning/Environment Prior Level of Function : Independent/Modified Independent;History of Falls (last six months)             Mobility Comments: Mod Ind amb community distances with a SPC, 2 falls in the last year secondary to tripping ADLs Comments: Mostly Ind with ADLs, more recently wife states he has been needing help with IADLs due to cognition and help with meal prep for safety concerns. A few months ago pt was golfing multiple times a week and much clear thinking per report from pt's wife.    OT Problem List: Decreased strength;Decreased activity tolerance;Impaired balance (sitting and/or standing);Decreased cognition;Decreased safety awareness;Decreased knowledge of use of DME or AE;Decreased knowledge of precautions;Pain   OT Treatment/Interventions: Self-care/ADL training;Energy conservation;DME and/or AE instruction;Therapeutic activities;Balance training;Patient/family education;Therapeutic exercise      OT Goals(Current goals can be found in the care plan section)   Acute Rehab OT Goals Patient Stated Goal: pt would like to return home with wife OT Goal  Formulation: With patient/family Time For Goal  Achievement: 05/11/24   OT Frequency:  Min 2X/week    Co-evaluation PT/OT/SLP Co-Evaluation/Treatment: Yes Reason for Co-Treatment: To address functional/ADL transfers;Necessary to address cognition/behavior during functional activity;For patient/therapist safety PT goals addressed during session: Mobility/safety with mobility;Balance;Proper use of DME        AM-PAC OT 6 Clicks Daily Activity     Outcome Measure Help from another person eating meals?: None Help from another person taking care of personal grooming?: A Little Help from another person toileting, which includes using toliet, bedpan, or urinal?: A Little Help from another person bathing (including washing, rinsing, drying)?: A Lot Help from another person to put on and taking off regular upper body clothing?: A Little Help from another person to put on and taking off regular lower body clothing?: A Lot 6 Click Score: 17   End of Session Equipment Utilized During Treatment: Rolling walker (2 wheels);Gait belt  Activity Tolerance: Patient tolerated treatment well Patient left: in chair;with call bell/phone within reach;with chair alarm set;with family/visitor present  OT Visit Diagnosis: Unsteadiness on feet (R26.81);Other abnormalities of gait and mobility (R26.89);Repeated falls (R29.6);Muscle weakness (generalized) (M62.81);Other symptoms and signs involving cognitive function                Time: 8594-8576 OT Time Calculation (min): 18 min Charges:  OT General Charges $OT Visit: 1 Visit OT Evaluation $OT Eval Moderate Complexity: 1 Mod   Leyland Kenna T Santino Kinsella, OTR/L, CLT  Scarlett Portlock 04/27/2024, 4:10 PM

## 2024-04-27 NOTE — Progress Notes (Signed)
 NAME:  MAUI BRITTEN, MRN:  [id number], DOB:  July 07, 1946, LOS: 12 ADMISSION DATE:  04/15/2024,  History of Present Illness:  78 y.o male with significant PMH of Infrarenal abdominal aortic aneurysm s/p endovascular repair of a ruptured AAA in 2011 on Xarelto , PAD, HTN, duodenal ulcer with perforation s/p omental patch repair, CKD stage III, thrombocytopenia, HDL, prior tobacco use, ESBL bacteremia, bladder cancer, chronic back pain, and bilateral inguinal hernia s/p repair who presented to the ED with chief complaints of abdominal pain and hemoptysis.   Patient and family at bedside reports that he took his last dose of Xarelto  yesterday evening.  He subsequently developed some abdominal pain around the epigastric and left upper quadrant region.  Since onset of symptoms has been coughing up bright red blood which he describes  blood filling up his mouth.  Denies other associated symptoms   ED Course: Initial vital signs showed HR of 68 beats/minute, BP 192/102 mm Hg, the RR 18 breaths/minute, and the oxygen saturation 93% on RA and a temperature of 98.27F (36.7C). Pertinent Labs/Diagnostics Findings: Na+/ K+:140/4.2 Glucose:94  BUN/Cr.:39/2.43 TAR:Lwmzfjmxjaoz  PCT: negative <0.10  BNP: 394.5  CTA Chest> CT Abd/pelvis> Saccular aneurysm at the level of the proximal stent with some soft tissue thickening and stranding concerning possible endoleak or impending rupture,Large aneurysm involving the left common iliac artery, partially encompasses the left iliac stent, Aneurysmal dilatation of the right common iliac artery at the distal end of the stent  Multiple small aneurysms of the proximal internal and iliac vessels.    CT findings discussed with on-call vascular Dr. Marea who recommended hemodynamic monitoring as it is unlikely patient will require emergent intervention but may require repair during the course of admission. PCCM consulted for admission, BP management and close  monitoring.  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 Aorto bifemoral endograft placement for AAA,  Chronic right-sided low back pain without sciatica;  Thrombocytopenia  Raynaud's disease with gangrene  B12 deficiency;  Personal history of malignant neoplasm of bladder;  Tobacco use disorder;  Hematuria, gross  ESBL Bacteremia due to septic shock due to obstructive stone with Pyelonephritis bilateral Inguinal Hernia s/p repair (02/24)  Significant Hospital Events: Including procedures, antibiotic start and stop dates in addition to other pertinent events   7/21: admit to ICU, on clevidipine  and esmolol  7/22: no recurrence of hemoptysis, but he continues to have a cough. EGD normal without any PUD or sign of GI bleed 7/23: worsening renal function, holding on surgical intervention 7/24: kidney function stable, ileus, N/V in the AM. Sat in chair 7/25: slept well overnight, kidney function improved. No complaints. Underwent renal artery stenting. 7/26: passing flatus, no abdominal pain 7/27: delirium, no pain reported but he is confused today 07/28: Patient complaining of new back pain, appears to be muscular. Remains on clevidipine  and emolol drip for strict bp control.  07/31: Patient remains stable with improved kidney function.  08/01: s/p endoleak repair by Dr. Dreama.   Interim History / Subjective:  Mr. Oddo is doing well and has no major complaints.  He is s/p endoleak repair by Dr. Keri 08/01.  Kidney function slightly worse now with Cr. 2.9 mg/dl. Hgb dropped from 12 to 8.7 g/dl. He is off Cleviprex  drip.  Objective    Blood pressure 114/66, pulse 71, temperature 98.8  F (37.1 C), temperature source Oral, resp. rate 15, height 5' 9 (1.753 m), weight 94.2  kg, SpO2 98%.        Intake/Output Summary (Last 24 hours) at 04/27/2024 0939 Last data filed at 04/27/2024 0600 Gross per 24 hour  Intake 406.24 ml  Output 690 ml  Net -283.76 ml   Filed Weights   04/25/24 0500 04/26/24 0431 04/27/24 0500  Weight: 98.1 kg 96 kg 94.2 kg    Examination: General: NAD HENT: Supple neck, reactive pupils Lungs: Clear bilateral air entry Cardiovascular: Normal S1, Normal S2, RRR Abdomen: Soft, non tender, non distended, +BS  Extremities: Warm and well perfused no edema.   Labs and imaging were reviewed.   Assessment and Plan  #Hemoptysis #Endoleak with Aneurysm Expansion #History of Infrarenal AAA s/p endovascular repeair s/p R renal artery stenting 07/25 with improvement in AKI. S/p endoleak repair 08/01 but unable to engag left renal artery and this remains unsalvageable .  #HTN emergency #AKI on CKD improving baseline Cr. 2.2 mg/dl.  #Community Acquired Pneumonia #Mild Cognitive Impairment   78 year old male presenting with an episode of hemoptysis (golf ball sized) with concern for hematemesis given history of GI bleed in the past. Patient was found to have a possible endoleak with threat of rupture and is admitted to the ICU for tight blood pressure control. He is s/p renal artery stenting.   Neuro - history of mild cognitive impairment, holding memantine and donepezil  given bradycardia.  CV - history of hypertension, and presented with elevated blood pressure. Started on strict HR and BP control given concern for endovascular leak and risk of AAA rupture. He does have significant vascular disease (bilateral severe renal artery stenosis, severe celiac artery stenosis, infrarenal abdominal aortoiliac stent graft, aneurysmal dilation of the abdominal aorta at the level of the renal arteries with a saccular component concerning for leakage vs impending rupture, left common iliac artery aneurysm). Given renal disease and need for contrast, endoleak  repair is delayed, and he underwent renal artery stenosis stenting. Continue with goal SBP of 130-140 mmHg and HR < 60.            - Metoprolol  tartrate 25mg  BID, Amlodipine  10mg  Daily and Hydralazine  50mg  Q6H scheduled. -- Pending endoleak repair today and possible left renal artery stenting.  Pulm - No recurrence of his hemoptysis/ Stable.  GI - Underwent EGD that did not show any PUD or sign of GI bleed. Discontinued high dose PPI. CT abdomen with signs of ileus. Tried NG tube but did not tolerate. Bowel sounds are improved, and he has passed flatus. Trial clear liquid diet and ok to advance diet as tolerated. Continue with OOB to chair. Advance diet as tolerated.  Renal - AKI on CKD, with bilateral renal artery stenosis. This was likely exacerbated by contrast exposure. Holding IV fluids and liberalizing BP requirements to allow for kidney perfusion (see CV above). Kidney function improved today.  Endo - ICU glycemic protocol Hem/Onc - on home rivaroxaban , holding given hemoptysis. H/H dropped post surgery to 8.7 g/dl from a baseline of 12. Keep holding AC. Can reinitiate once h/h confirmed stable. Repeat CBC this afternoon.  ID - Viral panel negative, on Ceftriaxone  and Azithromycin  for CAP coverage completed course. Sputum culture no growth.   Best Practice (right click and Reselect all SmartList Selections daily)   Diet/type: Regular consistency (see orders)  DVT prophylaxis Heparin  [place]  Pressure ulcer(s): N/A GI prophylaxis: N/A Lines: N/A Foley:  N/A Code Status:  full code  Last date of multidisciplinary goals of care discussion [04/27/2024`]  Critical care time: 35 minutes    Darrin Barn, MD [facility] 04/27/2024 9:39 AM

## 2024-04-27 NOTE — Plan of Care (Signed)
  Problem: Clinical Measurements: Goal: Ability to maintain clinical measurements within normal limits will improve Outcome: Progressing Goal: Will remain free from infection Outcome: Progressing Goal: Diagnostic test results will improve Outcome: Progressing Goal: Respiratory complications will improve Outcome: Progressing Goal: Cardiovascular complication will be avoided Outcome: Progressing   Problem: Coping: Goal: Level of anxiety will decrease Outcome: Progressing   Problem: Elimination: Goal: Will not experience complications related to bowel motility Outcome: Progressing   Problem: Activity: Goal: Risk for activity intolerance will decrease Outcome: Not Progressing   Problem: Nutrition: Goal: Adequate nutrition will be maintained Outcome: Not Progressing   Problem: Elimination: Goal: Will not experience complications related to urinary retention Outcome: Not Progressing

## 2024-04-27 NOTE — Progress Notes (Signed)
 [facility]  ROUNDING NOTE   Subjective:   Patient with past medical history of peripheral arterial disease, hypertension, abdominal aortic aneurysm, hyperlipidemia, bladder cancer, thrombocytopenia who was admitted with hypertensive crisis, abdominal aneurysm, type I endoleak of aortic graft.  Status post iliac artery repair with coil embolization by Dr. Jama.   Wife at bedside.   Update: UOP 690 Creatinine 2.69 (1.94)  08/01 0701 - 08/02 0700 In: 406.2 [P.O.:60; I.V.:346.2] Out: 690 [Urine:690] Lab Results  Component Value Date   CREATININE 2.69 (H) 04/27/2024   CREATININE 1.94 (H) 04/26/2024   CREATININE 1.91 (H) 04/25/2024     Objective:  Vital signs in last 24 hours:  Temp:  [96.8 F (36 C)-98.8 F (37.1 C)] 98.8 F (37.1 C) (08/02 0800) Pulse Rate:  [55-80] 71 (08/02 0800) Resp:  [11-25] 15 (08/02 0800) BP: (110-178)/(52-116) 114/66 (08/02 0800) SpO2:  [89 %-100 %] 98 % (08/02 0800) Weight:  [94.2 kg] 94.2 kg (08/02 0500)  Weight change: -1.8 kg Filed Weights   04/25/24 0500 04/26/24 0431 04/27/24 0500  Weight: 98.1 kg 96 kg 94.2 kg    Intake/Output: I/O last 3 completed shifts: In: 604.8 [P.O.:60; I.V.:544.8] Out: 1390 [Urine:1390]   Intake/Output this shift:  No intake/output data recorded.  Physical Exam: General: No acute distress  Head: Normocephalic, atraumatic. Moist oral mucosal membranes  Neck: Supple  Lungs:  Clear, 2L NCO2  Heart: Regular   Abdomen:  Soft, nontender, bowel sounds present  Extremities: Trace peripheral edema.  Neurologic: Awake, alert, following commands  Skin: No acute rash  Access: No hemodialysis access    Basic Metabolic Panel: Recent Labs  Lab 04/22/24 0515 04/23/24 0311 04/24/24 0329 04/25/24 0224 04/26/24 0518 04/26/24 0521 04/27/24 0633  NA 142 142 139 139 140  --  140  K 4.0 3.8 3.6 4.0 3.9  --  4.7  CL 112* 113* 110 109 109  --  110  CO2 20* 22 20* 21* 23  --  20*  GLUCOSE 91 85  91 76 83  --  104*  BUN 42* 38* 36* 33* 33*  --  46*  CREATININE 2.23* 2.08* 2.15* 1.91* 1.94*  --  2.69*  CALCIUM 8.5* 8.4* 8.4* 8.7* 8.6*  --  8.2*  MG 2.3 2.3 2.2 2.1  --  2.2  --   PHOS  --  2.7 3.0 2.6 2.8  --  5.1*    Liver Function Tests: Recent Labs  Lab 04/25/24 0224 04/26/24 0518 04/27/24 0633  ALBUMIN  2.9* 3.0* 2.8*   No results for input(s): LIPASE, AMYLASE in the last 168 hours. No results for input(s): AMMONIA in the last 168 hours.  CBC: Recent Labs  Lab 04/23/24 0311 04/24/24 0329 04/25/24 0224 04/26/24 0521 04/27/24 0633  WBC 8.1 8.8 9.3 9.6 14.5*  NEUTROABS 5.5 5.9  --   --   --   HGB 12.2* 12.2* 12.5* 12.7* 8.7*  HCT 36.8* 37.3* 37.0* 37.7* 26.1*  MCV 90.9 91.2 90.7 91.3 93.2  PLT 138* 144* 163 194 187    Cardiac Enzymes: No results for input(s): CKTOTAL, CKMB, CKMBINDEX, TROPONINI in the last 168 hours.  BNP: Invalid input(s): POCBNP  CBG: Recent Labs  Lab 04/26/24 1548 04/26/24 1936 04/26/24 2331 04/27/24 0350 04/27/24 0727  GLUCAP 216* 181* 126* 101* 109*    Microbiology: Results for orders placed or performed during the hospital encounter of 04/15/24  Blood culture (routine x 2)     Status: None   Collection Time: 04/15/24  8:55 PM   Specimen: BLOOD  Result Value Ref Range Status   Specimen Description BLOOD BLOOD RIGHT ARM  Final   Special Requests   Final    BOTTLES DRAWN AEROBIC AND ANAEROBIC Blood Culture adequate volume   Culture   Final    NO GROWTH 5 DAYS Performed at [facility], [address] [place], [place] [address]    Report Status 04/20/2024 FINAL  Final  Blood culture (routine x 2)     Status: None   Collection Time: 04/15/24  8:55 PM   Specimen: BLOOD  Result Value Ref Range Status   Specimen Description BLOOD BLOOD LEFT ARM  Final   Special Requests   Final    BOTTLES DRAWN AEROBIC AND ANAEROBIC Blood Culture results may not be optimal due to an inadequate volume of blood  received in culture bottles   Culture   Final    NO GROWTH 5 DAYS Performed at [facility], [address] [place], [place] [address]    Report Status 04/20/2024 FINAL  Final  MRSA Next Gen by PCR, Nasal     Status: None   Collection Time: 04/15/24 10:01 PM   Specimen: Nasal Mucosa; Nasal Swab  Result Value Ref Range Status   MRSA by PCR Next Gen NOT DETECTED NOT DETECTED Final    Comment: (NOTE) The GeneXpert MRSA Assay (FDA approved for NASAL specimens only), is one component of a comprehensive MRSA colonization surveillance program. It is not intended to diagnose MRSA infection nor to guide or monitor treatment for MRSA infections. Test performance is not FDA approved in patients less than 11 years old. Performed at [facility], [address] [place], [place] [address]   Culture, Respiratory w Gram Stain     Status: None   Collection Time: 04/16/24 11:42 AM   Specimen: SPU; Respiratory  Result Value Ref Range Status   Specimen Description   Final    SPUTUM Performed at [facility], [address] [place], [place] [address]    Special Requests   Final     EXPSU Performed at [facility], [address] [place], [place] [address]    Gram Stain   Final    ABUNDANT SQUAMOUS EPITHELIAL CELLS PRESENT MODERATE GRAM POSITIVE COCCI MODERATE GRAM NEGATIVE RODS    Culture   Final    Normal respiratory flora-no Staph aureus or Pseudomonas seen Performed at [facility], [address], [place], [place] [address]    Report Status 04/18/2024 FINAL  Final  Respiratory (~20 pathogens) panel by PCR     Status: None   Collection Time: 04/16/24 11:42 AM   Specimen: Expectorated Sputum; Respiratory  Result Value Ref Range Status   Adenovirus NOT DETECTED NOT DETECTED Final   Coronavirus 229E NOT DETECTED NOT DETECTED Final    Comment: (NOTE) The Coronavirus on the Respiratory Panel, DOES NOT test for the novel  Coronavirus (2019  nCoV)    Coronavirus HKU1 NOT DETECTED NOT DETECTED Final   Coronavirus NL63 NOT DETECTED NOT DETECTED Final   Coronavirus OC43 NOT DETECTED NOT DETECTED Final   Metapneumovirus NOT DETECTED NOT DETECTED Final   Rhinovirus / Enterovirus NOT DETECTED NOT DETECTED Final   Influenza A NOT DETECTED NOT DETECTED Final   Influenza B NOT DETECTED NOT DETECTED Final   Parainfluenza Virus 1 NOT DETECTED NOT DETECTED Final   Parainfluenza Virus 2 NOT DETECTED NOT DETECTED Final   Parainfluenza Virus 3 NOT DETECTED  NOT DETECTED Final   Parainfluenza Virus 4 NOT DETECTED NOT DETECTED Final   Respiratory Syncytial Virus NOT DETECTED NOT DETECTED Final   Bordetella pertussis NOT DETECTED NOT DETECTED Final   Bordetella Parapertussis NOT DETECTED NOT DETECTED Final   Chlamydophila pneumoniae NOT DETECTED NOT DETECTED Final   Mycoplasma pneumoniae NOT DETECTED NOT DETECTED Final    Comment: Performed at [facility], [address] [place], [place] [address]    Coagulation Studies: No results for input(s): LABPROT, INR in the last 72 hours.    Urinalysis: No results for input(s): COLORURINE, LABSPEC, PHURINE, GLUCOSEU, HGBUR, BILIRUBINUR, KETONESUR, PROTEINUR, UROBILINOGEN, NITRITE, LEUKOCYTESUR in the last 72 hours.  Invalid input(s): APPERANCEUR    Imaging: PERIPHERAL VASCULAR CATHETERIZATION Result Date: 04/26/2024 See surgical note for result.     Medications:    sodium chloride        amLODipine   10 mg Oral Daily   aspirin  EC  81 mg Oral Daily   Chlorhexidine  Gluconate Cloth  6 each Topical Daily   clopidogrel   75 mg Oral Daily   feeding supplement  237 mL Oral TID BM   hydrALAZINE   50 mg Oral Q6H   metoprolol  tartrate  25 mg Oral BID   multivitamin with minerals  1 tablet Oral Daily   polyethylene glycol  17 g Oral Daily   sodium chloride  flush  3 mL Intravenous Q12H   sodium chloride , acetaminophen , docusate sodium , fentaNYL  (SUBLIMAZE )  injection, hydrALAZINE , ondansetron  (ZOFRAN ) IV, mouth rinse, sodium chloride  flush, traMADol   Assessment/ Plan:  78 y.o. male with past medical history of peripheral arterial disease, hypertension, abdominal aortic aneurysm, hyperlipidemia, bladder cancer, thrombocytopenia who was admitted with hypertensive crisis, abdominal aneurysm, endoleak of aortic graft.  1.  Acute kidney injury/chronic kidney disease stage IIIb with proteinuria -baseline creatinine 2.2 with EGFR 30 on 01/16/2024.  Acute kidney injury likely multifactorial with contribution from hypoperfusion from bilateral renal artery stenosis, and exposure to contrast with CT angio chest/pelvis/abdomen.   08/01 0701 - 08/02 0700 In: 406.2 [P.O.:60; I.V.:346.2] Out: 690 [Urine:690] Lab Results  Component Value Date   CREATININE 2.69 (H) 04/27/2024   CREATININE 1.94 (H) 04/26/2024   CREATININE 1.91 (H) 04/25/2024   - follow up with nephrology as outpatient.   2.  Hypertension with chronic kidney disease: 146/75  Continue amlodipine , hydralazine , and metoprolol   - will need further blood pressure control.   3.  Bilateral renal artery stenosis in the setting of large type Ib endoleak, history of abdominal aortic aneurysm repair Patient is status post repair by Dr. Jama on 8/1      LOS: 12 Nathan Rivera 8/2/20259:03 AM

## 2024-04-27 NOTE — Evaluation (Signed)
 Physical Therapy Evaluation Patient Details Name: Nathan Rivera MRN: [id number] DOB: 10/02/1945 Today's Date: 04/27/2024  History of Present Illness  Pt is a 78 y.o male with PMH that includes: Infrarenal abdominal aortic aneurysm s/p endovascular repair of a ruptured AAA in 2011 on Xarelto , PAD, HTN, duodenal ulcer with perforation s/p omental patch repair, CKD stage III, thrombocytopenia, HDL, prior tobacco use, ESBL bacteremia, bladder cancer, chronic back pain, and bilateral inguinal hernia s/p repair who presented to the ED with chief complaints of abdominal pain and hemoptysis.  MD assessment includes: hemoptysis, endoleak with aneurysm expansion, HTN emergency, AKI on CKD, CAP, and mild cognitive impairment. Patient is s/p treatment of type Ib endoleak with position of the left internal iliac artery and extension of the left external iliac artery as well as left renal angiogram and angioplasty 04/26/24.   Clinical Impression  Patient received resting in bed. Wife at bedside. He is confused, has difficulty expressing self. Requires max/repeated cues to initiate mobility and min A to get seated edge of bed. He requires min A +2 for sit to stand and min A +2 for ambulation 45 feet with RW. Patient limited by fatigue this session. He requires a lot of cues due to confusion. He will continue to benefit from skilled PT to improve independence, endurance and safety with mobility.           If plan is discharge home, recommend the following: Assist for transportation;Help with stairs or ramp for entrance;A little help with walking and/or transfers;A lot of help with bathing/dressing/bathroom;Assistance with cooking/housework;Direct supervision/assist for medications management   Can travel by private vehicle   Yes    Equipment Recommendations None recommended by PT  Recommendations for Other Services       Functional Status Assessment Patient has had a recent decline in their functional status  and demonstrates the ability to make significant improvements in function in a reasonable and predictable amount of time.     Precautions / Restrictions Precautions Precautions: Fall Recall of Precautions/Restrictions: Impaired Restrictions Weight Bearing Restrictions Per Provider Order: No      Mobility  Bed Mobility Overal bed mobility: Needs Assistance Bed Mobility: Supine to Sit     Supine to sit: Min assist, HOB elevated     General bed mobility comments: Patient required max cues to initiate mobility. Min A needed to get seated edge of bed. Mostly due to cognition.    Transfers Overall transfer level: Needs assistance Equipment used: Rolling walker (2 wheels) Transfers: Sit to/from Stand Sit to Stand: Min assist, +2 physical assistance, +2 safety/equipment           General transfer comment: frequent multi modal cues for technique    Ambulation/Gait Ambulation/Gait assistance: Min assist Gait Distance (Feet): 45 Feet Assistive device: Rolling walker (2 wheels) Gait Pattern/deviations: Step-through pattern, Decreased step length - right, Decreased step length - left, Trunk flexed Gait velocity: decreased     General Gait Details: Patient requires max cues, decreased awareness, fatigue limiting distance this session. Required seated rest out in hallway and could not walk any further. Assistance needed with walker negotiation.  Stairs            Wheelchair Mobility     Tilt Bed    Modified Rankin (Stroke Patients Only)       Balance Overall balance assessment: Needs assistance Sitting-balance support: Feet supported Sitting balance-Leahy Scale: Good     Standing balance support: Bilateral upper extremity supported, During functional activity, Reliant on  assistive device for balance Standing balance-Leahy Scale: Poor Standing balance comment: Heavy reliant on RW for static standing, slight forward flexion noted                              Pertinent Vitals/Pain Pain Assessment Pain Assessment: PAINAD Breathing: normal Negative Vocalization: none Facial Expression: smiling or inexpressive Body Language: relaxed Consolability: no need to console PAINAD Score: 0    Home Living Family/patient expects to be discharged to:: Private residence Living Arrangements: Spouse/significant other Available Help at Discharge: Family;Available 24 hours/day Type of Home: House Home Access: Stairs to enter Entrance Stairs-Rails: Right Entrance Stairs-Number of Steps: 3   Home Layout: Two level;Able to live on main level with bedroom/bathroom Home Equipment: Rolling Walker (2 wheels);Rollator (4 wheels);Cane - single point;Shower seat - built in;BSC/3in1 Additional Comments: History and PLOF provided by spouse secondary to pt cognition    Prior Function Prior Level of Function : Independent/Modified Independent;History of Falls (last six months)             Mobility Comments: Mod Ind amb community distances with a SPC, 2 falls in the last year secondary to tripping ADLs Comments: Mostly Ind with ADLs, more recently wife states he has been needing help with IADLs due to cognition and help with meal prep for safety concerns. A few months ago pt was golfing multiple times a week and much clear thinking per report from pt's wife.     Extremity/Trunk Assessment   Upper Extremity Assessment Upper Extremity Assessment: Defer to OT evaluation    Lower Extremity Assessment Lower Extremity Assessment: Generalized weakness    Cervical / Trunk Assessment Cervical / Trunk Assessment: Normal  Communication   Communication Communication: Impaired Factors Affecting Communication: Difficulty expressing self    Cognition Arousal: Alert Behavior During Therapy: Anxious   PT - Cognitive impairments: History of cognitive impairments, Problem solving, Safety/Judgement, Memory, Sequencing   Orientation impairments: Place,  Time, Situation                     Following commands: Impaired Following commands impaired: Follows one step commands inconsistently, Follows one step commands with increased time     Cueing Cueing Techniques: Verbal cues, Gestural cues, Tactile cues, Visual cues     General Comments      Exercises     Assessment/Plan    PT Assessment Patient needs continued PT services  PT Problem List Decreased strength;Decreased activity tolerance;Decreased balance;Decreased mobility;Decreased knowledge of use of DME;Decreased cognition;Decreased coordination;Decreased safety awareness       PT Treatment Interventions DME instruction;Gait training;Stair training;Functional mobility training;Therapeutic activities;Therapeutic exercise;Balance training;Patient/family education;Cognitive remediation    PT Goals (Current goals can be found in the Care Plan section)  Acute Rehab PT Goals Patient Stated Goal: improve PT Goal Formulation: With family Time For Goal Achievement: 05/11/24 Potential to Achieve Goals: Fair    Frequency Min 2X/week     Co-evaluation PT/OT/SLP Co-Evaluation/Treatment: Yes Reason for Co-Treatment: To address functional/ADL transfers;Necessary to address cognition/behavior during functional activity;For patient/therapist safety PT goals addressed during session: Mobility/safety with mobility;Balance;Proper use of DME         AM-PAC PT 6 Clicks Mobility  Outcome Measure Help needed turning from your back to your side while in a flat bed without using bedrails?: A Little Help needed moving from lying on your back to sitting on the side of a flat bed without using bedrails?: A Little Help needed  moving to and from a bed to a chair (including a wheelchair)?: A Little Help needed standing up from a chair using your arms (e.g., wheelchair or bedside chair)?: A Lot Help needed to walk in hospital room?: A Lot Help needed climbing 3-5 steps with a railing? :  A Lot 6 Click Score: 15    End of Session Equipment Utilized During Treatment: Gait belt Activity Tolerance: Patient limited by fatigue Patient left: in chair;with call bell/phone within reach;with chair alarm set;with family/visitor present Nurse Communication: Mobility status PT Visit Diagnosis: Other abnormalities of gait and mobility (R26.89);Muscle weakness (generalized) (M62.81);Difficulty in walking, not elsewhere classified (R26.2);Unsteadiness on feet (R26.81)    Time: 8597-8574 PT Time Calculation (min) (ACUTE ONLY): 23 min   Charges:   PT Evaluation $PT Re-evaluation: 1 Re-eval PT Treatments $Gait Training: 8-22 mins PT General Charges $$ ACUTE PT VISIT: 1 Visit        Darshana Curnutt, PT, GCS 04/27/24,2:37 PM

## 2024-04-27 NOTE — Progress Notes (Signed)
  Vein and Vascular Surgery  Daily Progress Note   Subjective  -   Patient feels well.  No complaints overnight.  Access site is intact and no hematoma on either side.  Creatinine did bump some this morning but have discussed with nephrology and this is not necessarily surprising  Objective Vitals:   04/27/24 0800 04/27/24 0900 04/27/24 1000 04/27/24 1100  BP: 114/66 99/80 (!) 143/70 137/76  Pulse: 71 69 66 67  Resp: 15 17 (!) 28 (!) 21  Temp: 98.8 F (37.1 C)     TempSrc: Oral     SpO2: 98% 95% (!) 88% 92%  Weight:      Height:        Intake/Output Summary (Last 24 hours) at 04/27/2024 1144 Last data filed at 04/27/2024 0900 Gross per 24 hour  Intake 156.24 ml  Output 570 ml  Net -413.76 ml    PULM  CTAB CV  RRR VASC  access sites are clean, dry, and intact in both femoral regions.  Laboratory CBC    Component Value Date/Time   WBC 15.1 (H) 04/27/2024 0901   HGB 8.5 (L) 04/27/2024 0901   HGB 10.3 (L) 08/18/2013 0504   HCT 26.1 (L) 04/27/2024 0901   HCT 30.9 (L) 08/18/2013 0504   PLT 197 04/27/2024 0901   PLT 161 08/18/2013 0504    BMET    Component Value Date/Time   NA 140 04/27/2024 0633   NA 144 10/23/2014 1630   K 4.7 04/27/2024 0633   K 4.1 10/23/2014 1630   CL 110 04/27/2024 0633   CL 112 (H) 10/23/2014 1630   CO2 20 (L) 04/27/2024 0633   CO2 26 10/23/2014 1630   GLUCOSE 104 (H) 04/27/2024 0633   GLUCOSE 83 10/23/2014 1630   BUN 46 (H) 04/27/2024 0633   BUN 30 (H) 10/23/2014 1630   CREATININE 2.69 (H) 04/27/2024 0633   CREATININE 1.96 (H) 10/23/2014 1630   CALCIUM 8.2 (L) 04/27/2024 0633   CALCIUM 9.0 10/23/2014 1630   GFRNONAA 23 (L) 04/27/2024 0633   GFRNONAA 36 (L) 10/23/2014 1630   GFRNONAA 8 (L) 08/18/2013 0504   GFRAA 39 (L) 10/25/2014 0352   GFRAA 44 (L) 10/23/2014 1630   GFRAA 9 (L) 08/18/2013 0504    Assessment/Planning: POD #1 s/p treatment of type Ib endoleak with position of the left internal iliac artery and extension  of the left external iliac artery as well as left renal angiogram and angioplasty.  Anatomy precluded stent placement in the left renal artery as described by the operative note Right renal artery stent remains widely patent Bump in creatinine not necessarily surprising after manipulation of renal artery and significant contrast load Would continue to hydrate and allow renal recovery over the next several days Okay to transfer to the floor from vascular surgery point of view We will follow   Selinda Gu  04/27/2024, 11:44 AM

## 2024-04-27 NOTE — Progress Notes (Signed)
 PT Cancellation Note  Patient Details Name: Nathan Rivera MRN: [id number] DOB: December 01, 1945   Cancelled Treatment:    Reason Eval/Treat Not Completed: Medical issues which prohibited therapy Patient had surgical procedure yesterday with general anesthesia. Will require new PT order when/if patient is appropriate to continue with PT.  Delton Stelle 04/27/2024, 8:25 AM

## 2024-04-27 NOTE — Consult Note (Signed)
 PHARMACY CONSULT NOTE - ELECTROLYTES  Pharmacy Consult for Electrolyte Monitoring and Replacement   Recent Labs: Height: 5' 9 (175.3 cm) Weight: 94.2 kg (207 lb 10.8 oz) IBW/kg (Calculated) : 70.7 Estimated Creatinine Clearance: 25.6 mL/min (A) (by C-G formula based on SCr of 2.69 mg/dL (H)). Potassium (mmol/L)  Date Value  04/27/2024 4.7  10/23/2014 4.1   Magnesium  (mg/dL)  Date Value  91/98/7974 2.2   Calcium (mg/dL)  Date Value  91/97/7974 8.2 (L)   Calcium, Total (mg/dL)  Date Value  98/71/7983 9.0   Albumin  (g/dL)  Date Value  91/97/7974 2.8 (L)  08/18/2013 1.4 (L)   Phosphorus (mg/dL)  Date Value  91/97/7974 5.1 (H)   Sodium (mmol/L)  Date Value  04/27/2024 140  10/23/2014 144   Assessment  Nathan Rivera is a 78 y.o. male presenting with abdominal pain and hemoptysis. PMH significant for Infrarenal abdominal aortic aneurysm s/p endovascular repair of a ruptured AAA in 2011 on Xarelto , PAD, HTN, duodenal ulcer with perforation s/p omental patch repair, CKD stage III, thrombocytopenia, HDL, prior tobacco use, ESBL bacteremia, bladder cancer, chronic back pain, and bilateral inguinal hernia s/p repair . Pharmacy has been consulted to monitor and replace electrolytes.  MIVF: N/A Pertinent medications: clevidipine  gtt  Goal of Therapy: Electrolytes WNL  Plan:  No electrolyte replacement indicated at this time Renal function panel ordered for tomorrow  Thank you for allowing pharmacy to be a part of this patient's care.  Beckett Hickmon A Kabria Hetzer, PharmD Clinical Pharmacist 04/27/2024 7:26 AM

## 2024-04-28 DIAGNOSIS — T82310A Breakdown (mechanical) of aortic (bifurcation) graft (replacement), initial encounter: Secondary | ICD-10-CM | POA: Diagnosis not present

## 2024-04-28 LAB — CBC WITH DIFFERENTIAL/PLATELET
Abs Immature Granulocytes: 0.15 K/uL — ABNORMAL HIGH (ref 0.00–0.07)
Basophils Absolute: 0.1 K/uL (ref 0.0–0.1)
Basophils Relative: 0 %
Eosinophils Absolute: 0.2 K/uL (ref 0.0–0.5)
Eosinophils Relative: 1 %
HCT: 22.5 % — ABNORMAL LOW (ref 39.0–52.0)
Hemoglobin: 7.5 g/dL — ABNORMAL LOW (ref 13.0–17.0)
Immature Granulocytes: 1 %
Lymphocytes Relative: 10 %
Lymphs Abs: 1.2 K/uL (ref 0.7–4.0)
MCH: 30.9 pg (ref 26.0–34.0)
MCHC: 33.3 g/dL (ref 30.0–36.0)
MCV: 92.6 fL (ref 80.0–100.0)
Monocytes Absolute: 1 K/uL (ref 0.1–1.0)
Monocytes Relative: 8 %
Neutro Abs: 10 K/uL — ABNORMAL HIGH (ref 1.7–7.7)
Neutrophils Relative %: 80 %
Platelets: 172 K/uL (ref 150–400)
RBC: 2.43 MIL/uL — ABNORMAL LOW (ref 4.22–5.81)
RDW: 15.8 % — ABNORMAL HIGH (ref 11.5–15.5)
WBC: 12.5 K/uL — ABNORMAL HIGH (ref 4.0–10.5)
nRBC: 0 % (ref 0.0–0.2)

## 2024-04-28 LAB — GLUCOSE, CAPILLARY
Glucose-Capillary: 88 mg/dL (ref 70–99)
Glucose-Capillary: 92 mg/dL (ref 70–99)
Glucose-Capillary: 111 mg/dL — ABNORMAL HIGH (ref 70–99)
Glucose-Capillary: 96 mg/dL (ref 70–99)
Glucose-Capillary: 108 mg/dL — ABNORMAL HIGH (ref 70–99)

## 2024-04-28 LAB — HEMOGLOBIN AND HEMATOCRIT, BLOOD
HCT: 23.1 % — ABNORMAL LOW (ref 39.0–52.0)
Hemoglobin: 7.7 g/dL — ABNORMAL LOW (ref 13.0–17.0)

## 2024-04-28 LAB — BASIC METABOLIC PANEL WITH GFR
Anion gap: 10 (ref 5–15)
BUN: 49 mg/dL — ABNORMAL HIGH (ref 8–23)
CO2: 21 mmol/L — ABNORMAL LOW (ref 22–32)
Calcium: 8.4 mg/dL — ABNORMAL LOW (ref 8.9–10.3)
Chloride: 108 mmol/L (ref 98–111)
Creatinine, Ser: 2.61 mg/dL — ABNORMAL HIGH (ref 0.61–1.24)
GFR, Estimated: 24 mL/min — ABNORMAL LOW (ref >60)
Glucose, Bld: 90 mg/dL (ref 70–99)
Potassium: 4 mmol/L (ref 3.5–5.1)
Sodium: 139 mmol/L (ref 135–145)

## 2024-04-28 LAB — PHOSPHORUS: Phosphorus: 2.9 mg/dL (ref 2.5–4.6)

## 2024-04-28 LAB — MAGNESIUM: Magnesium: 2.3 mg/dL (ref 1.7–2.4)

## 2024-04-28 LAB — PROCALCITONIN: Procalcitonin: 0.3 ng/mL

## 2024-04-28 MED ORDER — BISACODYL 5 MG PO TBEC
10.0000 mg | DELAYED_RELEASE_TABLET | Freq: Once | ORAL | Status: AC
  Administered 2024-04-28: 10 mg via ORAL
  Filled 2024-04-28: qty 2

## 2024-04-28 MED ORDER — HYDRALAZINE HCL 20 MG/ML IJ SOLN
10.0000 mg | Freq: Four times a day (QID) | INTRAMUSCULAR | Status: DC | PRN
  Administered 2024-04-30: 10 mg via INTRAVENOUS
  Filled 2024-04-28: qty 1

## 2024-04-28 MED ORDER — HYDRALAZINE HCL 50 MG PO TABS
100.0000 mg | ORAL_TABLET | Freq: Four times a day (QID) | ORAL | Status: DC
  Administered 2024-04-28 – 2024-04-30 (×10): 100 mg via ORAL
  Filled 2024-04-28 (×9): qty 2

## 2024-04-28 NOTE — Plan of Care (Signed)
  Problem: Education: Goal: Knowledge of General Education information will improve Description: Including pain rating scale, medication(s)/side effects and non-pharmacologic comfort measures Outcome: Progressing   Problem: Clinical Measurements: Goal: Diagnostic test results will improve Outcome: Progressing Goal: Respiratory complications will improve Outcome: Progressing Goal: Cardiovascular complication will be avoided Outcome: Progressing   Problem: Activity: Goal: Risk for activity intolerance will decrease Outcome: Progressing   Problem: Nutrition: Goal: Adequate nutrition will be maintained Outcome: Progressing   Problem: Coping: Goal: Level of anxiety will decrease Outcome: Progressing   Problem: Elimination: Goal: Will not experience complications related to urinary retention Outcome: Progressing   Problem: Pain Managment: Goal: General experience of comfort will improve and/or be controlled Outcome: Progressing   Problem: Clinical Measurements: Goal: Ability to maintain clinical measurements within normal limits will improve Outcome: Not Progressing Goal: Will remain free from infection Outcome: Not Progressing   Problem: Elimination: Goal: Will not experience complications related to bowel motility Outcome: Not Progressing

## 2024-04-28 NOTE — Progress Notes (Signed)
 [facility]  Daily Progress Note   Subjective  -   No major events overnight.  Blood pressure has remained fairly elevated.  No fever or chills.  No significant pain overnight.  Objective Vitals:   04/28/24 0602 04/28/24 0700 04/28/24 0800 04/28/24 0806  BP: (!) 161/73 (!) 166/84 (!) 170/79 (!) 170/79  Pulse:  77 79   Resp:  16 16   Temp:      TempSrc:      SpO2:  96% 98%   Weight:      Height:        Intake/Output Summary (Last 24 hours) at 04/28/2024 0823 Last data filed at 04/28/2024 0600 Gross per 24 hour  Intake 743.29 ml  Output 1750 ml  Net -1006.71 ml    PULM  CTAB CV  RRR VASC  right and left groin access sites are clean, dry, and intact with mild bruising.  Laboratory CBC    Component Value Date/Time   WBC 12.5 (H) 04/28/2024 0513   HGB 7.5 (L) 04/28/2024 0513   HGB 10.3 (L) 08/18/2013 0504   HCT 22.5 (L) 04/28/2024 0513   HCT 30.9 (L) 08/18/2013 0504   PLT 172 04/28/2024 0513   PLT 161 08/18/2013 0504    BMET    Component Value Date/Time   NA 139 04/28/2024 0513   NA 144 10/23/2014 1630   K 4.0 04/28/2024 0513   K 4.1 10/23/2014 1630   CL 108 04/28/2024 0513   CL 112 (H) 10/23/2014 1630   CO2 21 (L) 04/28/2024 0513   CO2 26 10/23/2014 1630   GLUCOSE 90 04/28/2024 0513   GLUCOSE 83 10/23/2014 1630   BUN 49 (H) 04/28/2024 0513   BUN 30 (H) 10/23/2014 1630   CREATININE 2.61 (H) 04/28/2024 0513   CREATININE 1.96 (H) 10/23/2014 1630   CALCIUM 8.4 (L) 04/28/2024 0513   CALCIUM 9.0 10/23/2014 1630   GFRNONAA 24 (L) 04/28/2024 0513   GFRNONAA 36 (L) 10/23/2014 1630   GFRNONAA 8 (L) 08/18/2013 0504   GFRAA 39 (L) 10/25/2014 0352   GFRAA 44 (L) 10/23/2014 1630   GFRAA 9 (L) 08/18/2013 0504    Assessment/Planning: POD #2 s/p endovascular repair of abdominal aortic aneurysm with coil embolization of left hypogastric artery and extension of the stent graft down to the left external iliac artery.  Attempts at left renal  stenting as well.  Had right renal stent about 9 days ago.  Renal function has stabilized from yesterday.  Creatinine was 2.69 yesterday and is 2.61 today. Okay with his blood pressure running higher as the aneurysm has been repaired.  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  That being said, there is no acute risk of this. Again okay to transfer to the floor and increase mobility. No restrictions from vascular surgery point of view   Selinda Gu  04/28/2024, 8:23 AM

## 2024-04-28 NOTE — Progress Notes (Signed)
 [facility]  ROUNDING NOTE   Subjective:   Patient with past medical history of peripheral arterial disease, hypertension, abdominal aortic aneurysm, hyperlipidemia, bladder cancer, thrombocytopenia who was admitted with hypertensive crisis, abdominal aneurysm, type I endoleak of aortic graft.  Status post iliac artery repair with coil embolization by Dr. Jama 04/26/24   Wife and daughter at bedside.   UOP .  Cr 2.61 (2.69)  Objective:  Vital signs in last 24 hours:  Temp:  [98.6 F (37 C)-99 F (37.2 C)] 99 F (37.2 C) (08/03 0000) Pulse Rate:  [67-92] 92 (08/03 0900) Resp:  [13-29] 18 (08/03 0900) BP: (127-170)/(65-84) 162/77 (08/03 0900) SpO2:  [88 %-98 %] 88 % (08/03 0900) Weight:  [93.6 kg] 93.6 kg (08/03 0334)  Weight change: -0.6 kg Filed Weights   04/26/24 0431 04/27/24 0500 04/28/24 0334  Weight: 96 kg 94.2 kg 93.6 kg    Intake/Output: I/O last 3 completed shifts: In: 816.5 [P.O.:300; I.V.:516.5] Out: 2020 [Urine:2020]   Intake/Output this shift:  Total I/O In: 0  Out: 550 [Urine:550]  Physical Exam: General: No acute distress  Head: Normocephalic, atraumatic. Moist oral mucosal membranes  Neck: Supple  Lungs:  Clear, 2L [place] O2  Heart: Regular   Abdomen:  Soft, nontender, bowel sounds present  Extremities: no peripheral edema.  Neurologic: Awake, alert, following commands  Skin: No acute rash  Access: No hemodialysis access    Basic Metabolic Panel: Recent Labs  Lab 04/23/24 0311 04/24/24 0329 04/25/24 0224 04/26/24 0518 04/26/24 0521 04/27/24 0633 04/28/24 0513  NA 142 139 139 140  --  140 139  K 3.8 3.6 4.0 3.9  --  4.7 4.0  CL 113* 110 109 109  --  110 108  CO2 22 20* 21* 23  --  20* 21*  GLUCOSE 85 91 76 83  --  104* 90  BUN 38* 36* 33* 33*  --  46* 49*  CREATININE 2.08* 2.15* 1.91* 1.94*  --  2.69* 2.61*  CALCIUM 8.4* 8.4* 8.7* 8.6*  --  8.2* 8.4*  MG 2.3 2.2 2.1  --  2.2  --  2.3  PHOS 2.7 3.0 2.6 2.8  --   5.1* 2.9    Liver Function Tests: Recent Labs  Lab 04/25/24 0224 04/26/24 0518 04/27/24 0633  ALBUMIN  2.9* 3.0* 2.8*   No results for input(s): LIPASE, AMYLASE in the last 168 hours. No results for input(s): AMMONIA in the last 168 hours.  CBC: Recent Labs  Lab 04/23/24 0311 04/24/24 0329 04/25/24 0224 04/26/24 0521 04/27/24 9366 04/27/24 0901 04/27/24 1622 04/28/24 0513  WBC 8.1 8.8   < > 9.6 14.5* 15.1* 14.4* 12.5*  NEUTROABS 5.5 5.9  --   --   --   --   --  10.0*  HGB 12.2* 12.2*   < > 12.7* 8.7* 8.5* 8.3* 7.5*  HCT 36.8* 37.3*   < > 37.7* 26.1* 26.1* 24.7* 22.5*  MCV 90.9 91.2   < > 91.3 93.2 93.2 92.5 92.6  PLT 138* 144*   < > 194 187 197 197 172   < > = values in this interval not displayed.    Cardiac Enzymes: No results for input(s): CKTOTAL, CKMB, CKMBINDEX, TROPONINI in the last 168 hours.  BNP: Invalid input(s): POCBNP  CBG: Recent Labs  Lab 04/27/24 1619 04/27/24 1937 04/27/24 2308 04/28/24 0314 04/28/24 0743  GLUCAP 120* 108* 98 88 92    Microbiology: Results for orders placed or performed during the hospital encounter  of 04/15/24  Blood culture (routine x 2)     Status: None   Collection Time: 04/15/24  8:55 PM   Specimen: BLOOD  Result Value Ref Range Status   Specimen Description BLOOD BLOOD RIGHT ARM  Final   Special Requests   Final    BOTTLES DRAWN AEROBIC AND ANAEROBIC Blood Culture adequate volume   Culture   Final    NO GROWTH 5 DAYS Performed at [facility], [address] [place], [place] [address]    Report Status 04/20/2024 FINAL  Final  Blood culture (routine x 2)     Status: None   Collection Time: 04/15/24  8:55 PM   Specimen: BLOOD  Result Value Ref Range Status   Specimen Description BLOOD BLOOD LEFT ARM  Final   Special Requests   Final    BOTTLES DRAWN AEROBIC AND ANAEROBIC Blood Culture results may not be optimal due to an inadequate volume of blood received in culture bottles    Culture   Final    NO GROWTH 5 DAYS Performed at [facility], [address] [place], [place] [address]    Report Status 04/20/2024 FINAL  Final  MRSA Next Gen by PCR, Nasal     Status: None   Collection Time: 04/15/24 10:01 PM   Specimen: Nasal Mucosa; Nasal Swab  Result Value Ref Range Status   MRSA by PCR Next Gen NOT DETECTED NOT DETECTED Final    Comment: (NOTE) The GeneXpert MRSA Assay (FDA approved for NASAL specimens only), is one component of a comprehensive MRSA colonization surveillance program. It is not intended to diagnose MRSA infection nor to guide or monitor treatment for MRSA infections. Test performance is not FDA approved in patients less than 41 years old. Performed at [facility], [address] [place], [place] [address]   Culture, Respiratory w Gram Stain     Status: None   Collection Time: 04/16/24 11:42 AM   Specimen: SPU; Respiratory  Result Value Ref Range Status   Specimen Description   Final    SPUTUM Performed at [facility], [address] [place], [place] [address]    Special Requests   Final     EXPSU Performed at [facility], [address] [place], [place] [address]    Gram Stain   Final    ABUNDANT SQUAMOUS EPITHELIAL CELLS PRESENT MODERATE GRAM POSITIVE COCCI MODERATE GRAM NEGATIVE RODS    Culture   Final    Normal respiratory flora-no Staph aureus or Pseudomonas seen Performed at [facility], [address], [place], [place] [address]    Report Status 04/18/2024 FINAL  Final  Respiratory (~20 pathogens) panel by PCR     Status: None   Collection Time: 04/16/24 11:42 AM   Specimen: Expectorated Sputum; Respiratory  Result Value Ref Range Status   Adenovirus NOT DETECTED NOT DETECTED Final   Coronavirus 229E NOT DETECTED NOT DETECTED Final    Comment: (NOTE) The Coronavirus on the Respiratory Panel, DOES NOT test for the novel  Coronavirus (2019 nCoV)    Coronavirus HKU1 NOT  DETECTED NOT DETECTED Final   Coronavirus NL63 NOT DETECTED NOT DETECTED Final   Coronavirus OC43 NOT DETECTED NOT DETECTED Final   Metapneumovirus NOT DETECTED NOT DETECTED Final   Rhinovirus / Enterovirus NOT DETECTED NOT DETECTED Final   Influenza A NOT DETECTED NOT DETECTED Final   Influenza B NOT DETECTED NOT DETECTED Final   Parainfluenza Virus 1 NOT DETECTED  NOT DETECTED Final   Parainfluenza Virus 2 NOT DETECTED NOT DETECTED Final   Parainfluenza Virus 3 NOT DETECTED NOT DETECTED Final   Parainfluenza Virus 4 NOT DETECTED NOT DETECTED Final   Respiratory Syncytial Virus NOT DETECTED NOT DETECTED Final   Bordetella pertussis NOT DETECTED NOT DETECTED Final   Bordetella Parapertussis NOT DETECTED NOT DETECTED Final   Chlamydophila pneumoniae NOT DETECTED NOT DETECTED Final   Mycoplasma pneumoniae NOT DETECTED NOT DETECTED Final    Comment: Performed at [facility], [address] [place], [place] [address]    Coagulation Studies: No results for input(s): LABPROT, INR in the last 72 hours.    Urinalysis: No results for input(s): COLORURINE, LABSPEC, PHURINE, GLUCOSEU, HGBUR, BILIRUBINUR, KETONESUR, PROTEINUR, UROBILINOGEN, NITRITE, LEUKOCYTESUR in the last 72 hours.  Invalid input(s): APPERANCEUR    Imaging: No results found.     Medications:       amLODipine   10 mg Oral Daily   aspirin  EC  81 mg Oral Daily   Chlorhexidine  Gluconate Cloth  6 each Topical Daily   clopidogrel   75 mg Oral Daily   feeding supplement  237 mL Oral TID BM   hydrALAZINE   50 mg Oral Q6H   metoprolol  tartrate  25 mg Oral BID   multivitamin with minerals  1 tablet Oral Daily   polyethylene glycol  17 g Oral Daily   sodium chloride  flush  3 mL Intravenous Q12H   acetaminophen , docusate sodium , fentaNYL  (SUBLIMAZE ) injection, hydrALAZINE , ondansetron  (ZOFRAN ) IV, mouth rinse, sodium chloride  flush, traMADol   Assessment/ Plan:  78 y.o. male with past  medical history of peripheral arterial disease, hypertension, abdominal aortic aneurysm, hyperlipidemia, bladder cancer, thrombocytopenia who was admitted with hypertensive crisis, abdominal aneurysm, endoleak of aortic graft.  1.  Acute kidney injury/chronic kidney disease stage IIIb with proteinuria -baseline creatinine 2.2 with EGFR 30 on 01/16/2024.  Acute kidney injury likely multifactorial with contribution from hypoperfusion from bilateral renal artery stenosis, and exposure to contrast with CT angio chest/pelvis/abdomen.   08/02 0701 - 08/03 0700 In: 743.3 [P.O.:240; I.V.:503.3] Out: 1750 [Urine:1750] Lab Results  Component Value Date   CREATININE 2.61 (H) 04/28/2024   CREATININE 2.69 (H) 04/27/2024   CREATININE 1.94 (H) 04/26/2024    2.  Hypertension with chronic kidney disease: 162/77  Continue amlodipine , hydralazine , and metoprolol   - increase hydralazine  to 100mg   3.  Bilateral renal artery stenosis in the setting of large type Ib endoleak, history of abdominal aortic aneurysm repair Patient is status post repair by Dr. Jama on 8/1   Will need outpatient nephrology follow up.    LOS: 13 Faisal Stradling 8/3/202510:27 AM

## 2024-04-28 NOTE — Progress Notes (Signed)
 [facility] Progress Note  Patient: Nathan Rivera    FMW:[id number]  DOA: 04/15/2024     Date of Service: the patient was seen and examined on 04/28/2024  Chief Complaint  Patient presents with   Hemoptysis   Brief hospital course: 78 y.o male with significant PMH of Infrarenal abdominal aortic aneurysm s/p endovascular repair of a ruptured AAA in 2011 on Xarelto , PAD, HTN, duodenal ulcer with perforation s/p omental patch repair, CKD stage III, thrombocytopenia, HDL, prior tobacco use, ESBL bacteremia, bladder cancer, chronic back pain, and bilateral inguinal hernia s/p repair who presented to the ED with chief complaints of abdominal pain and hemoptysis.   Family at bedside reports that he took his last dose of Xarelto  yesterday evening.  He subsequently developed some abdominal pain around the epigastric and left upper quadrant region.  Since onset of symptoms has been coughing up bright red blood which he describes  blood filling up his mouth.  Denies other associated symptoms   ED Course: Initial vital signs showed HR of 68 beats/minute, BP 192/102 mm Hg, the RR 18 breaths/minute, and the oxygen saturation 93% on RA and a temperature of 98.49F (36.7C). Pertinent Labs/Diagnostics Findings: Na+/ K+:140/4.2 Glucose:94  BUN/Cr.:39/2.43 TAR:[id number]  PCT: negative <0.10  BNP: 394.5  CTA Chest> CT Abd/pelvis> Saccular aneurysm at the level of the proximal stent with some soft tissue thickening and stranding concerning possible endoleak or impending rupture,Large aneurysm involving the left common iliac artery, partially encompasses the left iliac stent, Aneurysmal dilatation of the right common iliac artery at the distal end of the stent  Multiple small aneurysms of the proximal internal and iliac vessels.    CT findings discussed with on-call vascular Dr. Marea who recommended hemodynamic monitoring as it is unlikely patient will require emergent intervention but may require repair  during the course of admission. PCCM consulted for admission, BP management and close monitoring.   Significant Hospital Events:  7/21: admit to ICU, on clevidipine  and esmolol  7/22: no recurrence of hemoptysis, but he continues to have a cough. EGD normal without any PUD or sign of GI bleed 7/23: worsening renal function, holding on surgical intervention 7/24: kidney function stable, ileus, N/V in the AM. Sat in chair 7/25: slept well overnight, kidney function improved. No complaints. Underwent renal artery stenting. 7/26: passing flatus, no abdominal pain 7/27: delirium, no pain reported but he is confused today 07/28: Patient complaining of new back pain, appears to be muscular. Remains on clevidipine  and emolol drip for strict bp control.  07/31: Patient remains stable with improved kidney function.  08/01: s/p endoleak repair by Dr. Dreama.   Patient was transferred under [facility] service on 04/28/2024.  Please review prior notes for details and further management as below.  Assessment and Plan: # Hemoptysis # Endoleak with Aneurysm Expansion # History of Infrarenal AAA s/p endovascular repeair s/p R renal artery stenting 07/25 with improvement in AKI. S/p endoleak repair 08/01 but unable to engag left renal artery and this remains unsalvageable .  bilateral severe renal artery stenosis, severe celiac artery stenosis, infrarenal abdominal aortoiliac stent graft, aneurysmal dilation of the abdominal aorta at the level of the renal arteries with a saccular component concerning for leakage vs impending rupture, left common iliac artery aneurysm.  Given renal disease and need for contrast, endoleak repair is delayed, and he underwent renal artery stenosis stenting.  S/p EGD negative for any PUD or GI bleed. Patient was on Xarelto , which which has been held since admission.  Restart DOAC (xarelto  20 home dose) when cleared by vascular surgery Continue aspirin  and Plavix  in the meantime   #  HTN emergency Continue with goal SBP of 130-140 mmHg and HR < 60. - Metoprolol  tartrate 25mg  BID - Amlodipine  10mg  Daily  - Hydralazine  100mg  Q6H scheduled, increased dose on 8/3 as per nephro    # AKI on CKD improving baseline Cr. 2.2 mg/dl.  With bilateral renal artery stenosis AKI most likely due to contrast-induced nephropathy. Creatinine 2.61 Renal function is gradually improving Monitor BMP daily  # Acute blood loss anemia 8/3 Hb 7.5 Monitor CBC daily   # Community Acquired Pneumonia Viral panel negative, s/p Ceftriaxone  and Azithromycin  for CAP coverage completed course. Sputum culture no growth  No recurrence of his hemoptysis/ Stable.  WBC count trending down.  Procalcitonin 0.3 negative on 8/3  # Mild Cognitive Impairment history of mild cognitive impairment, holding memantine and donepezil  given bradycardia.     Body mass index is 30.47 kg/m.  Interventions:  Diet: Heart healthy diet DVT Prophylaxis: SCD, pharmacological prophylaxis contraindicated due to risk of bleeding   Advance goals of care discussion: Full code  Family Communication: family was present at bedside, at the time of interview.  The pt provided permission to discuss medical plan with the family. Opportunity was given to ask question and all questions were answered satisfactorily.   Disposition:  Pt is from home, admitted with AAA leak status post repair and right renal artery stenting.  Gradually improving, downgraded from PCCM to TRH on 8/3, which precludes a safe discharge. Discharge to Home, when stable and cleared by vascular surgery, may need few more days to improve.  Subjective: No significant events overnight, left arm pain 5/10, no BM for past 4 days, denies any abdominal pain, nausea vomiting.  Denies any chest pain or palpitations.  No shortness of breath.  Physical Exam: General: NAD, lying comfortably Appear in no distress, affect appropriate Eyes: PERRLA ENT: Oral Mucosa  Clear, moist  Neck: no JVD,  Cardiovascular: S1 and S2 Present, no Murmur,  Respiratory: good respiratory effort, Bilateral Air entry equal and Decreased, no Crackles, no wheezes Abdomen: Bowel Sound present, Soft and no tenderness,  Skin: no rashes Extremities: no Pedal edema, no calf tenderness Neurologic: without any new focal findings Gait not checked due to patient safety concerns  Vitals:   04/28/24 0806 04/28/24 0834 04/28/24 0900 04/28/24 1249  BP: (!) 170/79 (!) 144/77 (!) 162/77 138/69  Pulse:  83 92   Resp:  16 18   Temp:      TempSrc:      SpO2:  97% (!) 88%   Weight:      Height:        Intake/Output Summary (Last 24 hours) at 04/28/2024 1330 Last data filed at 04/28/2024 0900 Gross per 24 hour  Intake 503.29 ml  Output 2300 ml  Net -1796.71 ml   Filed Weights   04/26/24 0431 04/27/24 0500 04/28/24 0334  Weight: 96 kg 94.2 kg 93.6 kg    Data Reviewed: I have personally reviewed and interpreted daily labs, tele strips, imagings as discussed above. I reviewed all nursing notes, pharmacy notes, vitals, pertinent old records I have discussed plan of care as described above with RN and patient/family.  CBC: Recent Labs  Lab 04/23/24 0311 04/24/24 0329 04/25/24 0224 04/26/24 0521 04/27/24 0633 04/27/24 0901 04/27/24 1622 04/28/24 0513  WBC 8.1 8.8   < > 9.6 14.5* 15.1* 14.4* 12.5*  NEUTROABS 5.5 5.9  --   --   --   --   --  10.0*  HGB 12.2* 12.2*   < > 12.7* 8.7* 8.5* 8.3* 7.5*  HCT 36.8* 37.3*   < > 37.7* 26.1* 26.1* 24.7* 22.5*  MCV 90.9 91.2   < > 91.3 93.2 93.2 92.5 92.6  PLT 138* 144*   < > 194 187 197 197 172   < > = values in this interval not displayed.   Basic Metabolic Panel: Recent Labs  Lab 04/23/24 0311 04/24/24 0329 04/25/24 0224 04/26/24 0518 04/26/24 0521 04/27/24 0633 04/28/24 0513  NA 142 139 139 140  --  140 139  K 3.8 3.6 4.0 3.9  --  4.7 4.0  CL 113* 110 109 109  --  110 108  CO2 22 20* 21* 23  --  20* 21*  GLUCOSE 85 91  76 83  --  104* 90  BUN 38* 36* 33* 33*  --  46* 49*  CREATININE 2.08* 2.15* 1.91* 1.94*  --  2.69* 2.61*  CALCIUM 8.4* 8.4* 8.7* 8.6*  --  8.2* 8.4*  MG 2.3 2.2 2.1  --  2.2  --  2.3  PHOS 2.7 3.0 2.6 2.8  --  5.1* 2.9    Studies: No results found.  Scheduled Meds:  amLODipine   10 mg Oral Daily   aspirin  EC  81 mg Oral Daily   Chlorhexidine  Gluconate Cloth  6 each Topical Daily   clopidogrel   75 mg Oral Daily   feeding supplement  237 mL Oral TID BM   hydrALAZINE   100 mg Oral Q6H   metoprolol  tartrate  25 mg Oral BID   multivitamin with minerals  1 tablet Oral Daily   polyethylene glycol  17 g Oral Daily   sodium chloride  flush  3 mL Intravenous Q12H   Continuous Infusions: PRN Meds: acetaminophen , docusate sodium , fentaNYL  (SUBLIMAZE ) injection, hydrALAZINE , ondansetron  (ZOFRAN ) IV, mouth rinse, sodium chloride  flush, traMADol   Time spent: 55 minutes  Author: ELVAN SOR. MD [facility] 04/28/2024 1:30 PM  To reach On-call, see care teams to locate the attending and reach out to them via [email or web address]. If 7PM-7AM, please contact night-coverage If you still have difficulty reaching the attending provider, please page the [facility] (Director on Call) for [facility] on amion for assistance.

## 2024-04-29 DIAGNOSIS — T82310A Breakdown (mechanical) of aortic (bifurcation) graft (replacement), initial encounter: Secondary | ICD-10-CM | POA: Diagnosis not present

## 2024-04-29 DIAGNOSIS — I701 Atherosclerosis of renal artery: Secondary | ICD-10-CM | POA: Diagnosis not present

## 2024-04-29 DIAGNOSIS — I723 Aneurysm of iliac artery: Secondary | ICD-10-CM | POA: Diagnosis not present

## 2024-04-29 DIAGNOSIS — Z8679 Personal history of other diseases of the circulatory system: Secondary | ICD-10-CM

## 2024-04-29 DIAGNOSIS — Z9889 Other specified postprocedural states: Secondary | ICD-10-CM | POA: Diagnosis not present

## 2024-04-29 LAB — BASIC METABOLIC PANEL WITH GFR
Anion gap: 10 (ref 5–15)
BUN: 44 mg/dL — ABNORMAL HIGH (ref 8–23)
CO2: 23 mmol/L (ref 22–32)
Calcium: 8.7 mg/dL — ABNORMAL LOW (ref 8.9–10.3)
Chloride: 108 mmol/L (ref 98–111)
Creatinine, Ser: 2.61 mg/dL — ABNORMAL HIGH (ref 0.61–1.24)
GFR, Estimated: 24 mL/min — ABNORMAL LOW (ref >60)
Glucose, Bld: 89 mg/dL (ref 70–99)
Potassium: 4 mmol/L (ref 3.5–5.1)
Sodium: 141 mmol/L (ref 135–145)

## 2024-04-29 LAB — GLUCOSE, CAPILLARY
Glucose-Capillary: 101 mg/dL — ABNORMAL HIGH (ref 70–99)
Glucose-Capillary: 116 mg/dL — ABNORMAL HIGH (ref 70–99)
Glucose-Capillary: 86 mg/dL (ref 70–99)
Glucose-Capillary: 87 mg/dL (ref 70–99)
Glucose-Capillary: 87 mg/dL (ref 70–99)
Glucose-Capillary: 88 mg/dL (ref 70–99)
Glucose-Capillary: 90 mg/dL (ref 70–99)

## 2024-04-29 LAB — CBC
HCT: 23.1 % — ABNORMAL LOW (ref 39.0–52.0)
Hemoglobin: 7.6 g/dL — ABNORMAL LOW (ref 13.0–17.0)
MCH: 30.3 pg (ref 26.0–34.0)
MCHC: 32.9 g/dL (ref 30.0–36.0)
MCV: 92 fL (ref 80.0–100.0)
Platelets: 190 K/uL (ref 150–400)
RBC: 2.51 MIL/uL — ABNORMAL LOW (ref 4.22–5.81)
RDW: 15.3 % (ref 11.5–15.5)
WBC: 12.2 K/uL — ABNORMAL HIGH (ref 4.0–10.5)
nRBC: 0.2 % (ref 0.0–0.2)

## 2024-04-29 LAB — MAGNESIUM: Magnesium: 2.4 mg/dL (ref 1.7–2.4)

## 2024-04-29 LAB — PHOSPHORUS: Phosphorus: 2.1 mg/dL — ABNORMAL LOW (ref 2.5–4.6)

## 2024-04-29 MED ORDER — BISACODYL 10 MG RE SUPP: 10.0000 mg | Freq: Once | RECTAL | Status: DC

## 2024-04-29 MED ORDER — SODIUM CHLORIDE 0.9 % IV SOLN: INTRAVENOUS | Status: AC

## 2024-04-29 MED ORDER — BISACODYL 5 MG PO TBEC: 10.0000 mg | DELAYED_RELEASE_TABLET | Freq: Every day | ORAL | Status: DC

## 2024-04-29 MED ORDER — POLYETHYLENE GLYCOL 3350 17 G PO PACK
17.0000 g | PACK | Freq: Two times a day (BID) | ORAL | Status: DC
  Administered 2024-04-29 – 2024-04-30 (×2): 17 g via ORAL
  Filled 2024-04-29 (×2): qty 1

## 2024-04-29 MED ORDER — RIVAROXABAN 20 MG PO TABS
20.0000 mg | ORAL_TABLET | Freq: Every day | ORAL | Status: DC
  Administered 2024-04-30: 20 mg via ORAL

## 2024-04-29 MED ORDER — BISACODYL 10 MG RE SUPP: 10.0000 mg | Freq: Every day | RECTAL | Status: DC | PRN

## 2024-04-29 NOTE — Progress Notes (Signed)
 [facility]  ROUNDING NOTE   Subjective:   Patient with past medical history of peripheral arterial disease, hypertension, abdominal aortic aneurysm, hyperlipidemia, bladder cancer, thrombocytopenia who was admitted with hypertensive crisis, abdominal aneurysm, type I endoleak of aortic graft.  Status post iliac artery repair with coil embolization by Dr. Jama 04/26/24   Daughter at bedside.   States that he is not eating enough.  Urine output at 1400 cc yesterday. Serum creatinine today is at 2.61 which is stable compared to yesterday.  Objective:  Vital signs in last 24 hours:  Temp:  [98.2 F (36.8 C)-98.7 F (37.1 C)] 98.2 F (36.8 C) (08/04 0100) Pulse Rate:  [66-208] 78 (08/04 0700) Resp:  [15-25] 18 (08/04 0700) BP: (120-170)/(44-121) 144/74 (08/04 0700) SpO2:  [88 %-98 %] 97 % (08/04 0700)  Weight change:  Filed Weights   04/26/24 0431 04/27/24 0500 04/28/24 0334  Weight: 96 kg 94.2 kg 93.6 kg    Intake/Output: I/O last 3 completed shifts: In: 9 [I.V.:9] Out: 2600 [Urine:2600]   Intake/Output this shift:  No intake/output data recorded.  Physical Exam: General: No acute distress  Head: Normocephalic, atraumatic. Moist oral mucosal membranes  Neck: Supple  Lungs:  Clear, 2L [place] O2  Heart: Regular   Abdomen:  Soft, nontender, bowel sounds present  Extremities: no peripheral edema.  Neurologic: Awake, alert, following commands  Skin: No acute rash  Access: No hemodialysis access    Basic Metabolic Panel: Recent Labs  Lab 04/24/24 0329 04/25/24 0224 04/26/24 0518 04/26/24 0521 04/27/24 0633 04/28/24 0513 04/29/24 0438  NA 139 139 140  --  140 139 141  K 3.6 4.0 3.9  --  4.7 4.0 4.0  CL 110 109 109  --  110 108 108  CO2 20* 21* 23  --  20* 21* 23  GLUCOSE 91 76 83  --  104* 90 89  BUN 36* 33* 33*  --  46* 49* 44*  CREATININE 2.15* 1.91* 1.94*  --  2.69* 2.61* 2.61*  CALCIUM 8.4* 8.7* 8.6*  --  8.2* 8.4* 8.7*  MG 2.2 2.1  --  2.2   --  2.3 2.4  PHOS 3.0 2.6 2.8  --  5.1* 2.9 2.1*    Liver Function Tests: Recent Labs  Lab 04/25/24 0224 04/26/24 0518 04/27/24 0633  ALBUMIN  2.9* 3.0* 2.8*   No results for input(s): LIPASE, AMYLASE in the last 168 hours. No results for input(s): AMMONIA in the last 168 hours.  CBC: Recent Labs  Lab 04/23/24 0311 04/24/24 0329 04/25/24 0224 04/27/24 9366 04/27/24 0901 04/27/24 1622 04/28/24 0513 04/28/24 1353 04/29/24 0438  WBC 8.1 8.8   < > 14.5* 15.1* 14.4* 12.5*  --  12.2*  NEUTROABS 5.5 5.9  --   --   --   --  10.0*  --   --   HGB 12.2* 12.2*   < > 8.7* 8.5* 8.3* 7.5* 7.7* 7.6*  HCT 36.8* 37.3*   < > 26.1* 26.1* 24.7* 22.5* 23.1* 23.1*  MCV 90.9 91.2   < > 93.2 93.2 92.5 92.6  --  92.0  PLT 138* 144*   < > 187 197 197 172  --  190   < > = values in this interval not displayed.    Cardiac Enzymes: No results for input(s): CKTOTAL, CKMB, CKMBINDEX, TROPONINI in the last 168 hours.  BNP: Invalid input(s): POCBNP  CBG: Recent Labs  Lab 04/28/24 1633 04/28/24 1934 04/29/24 0001 04/29/24 0356 04/29/24 0730  GLUCAP 96 108* 90 88 86    Microbiology: Results for orders placed or performed during the hospital encounter of 04/15/24  Blood culture (routine x 2)     Status: None   Collection Time: 04/15/24  8:55 PM   Specimen: BLOOD  Result Value Ref Range Status   Specimen Description BLOOD BLOOD RIGHT ARM  Final   Special Requests   Final    BOTTLES DRAWN AEROBIC AND ANAEROBIC Blood Culture adequate volume   Culture   Final    NO GROWTH 5 DAYS Performed at [facility], [address] [place], [place] [address]    Report Status 04/20/2024 FINAL  Final  Blood culture (routine x 2)     Status: None   Collection Time: 04/15/24  8:55 PM   Specimen: BLOOD  Result Value Ref Range Status   Specimen Description BLOOD BLOOD LEFT ARM  Final   Special Requests   Final    BOTTLES DRAWN AEROBIC AND ANAEROBIC Blood Culture results may  not be optimal due to an inadequate volume of blood received in culture bottles   Culture   Final    NO GROWTH 5 DAYS Performed at [facility], [address] [place], [place] [address]    Report Status 04/20/2024 FINAL  Final  MRSA Next Gen by PCR, Nasal     Status: None   Collection Time: 04/15/24 10:01 PM   Specimen: Nasal Mucosa; Nasal Swab  Result Value Ref Range Status   MRSA by PCR Next Gen NOT DETECTED NOT DETECTED Final    Comment: (NOTE) The GeneXpert MRSA Assay (FDA approved for NASAL specimens only), is one component of a comprehensive MRSA colonization surveillance program. It is not intended to diagnose MRSA infection nor to guide or monitor treatment for MRSA infections. Test performance is not FDA approved in patients less than 28 years old. Performed at [facility], [address] [place], [place] [address]   Culture, Respiratory w Gram Stain     Status: None   Collection Time: 04/16/24 11:42 AM   Specimen: SPU; Respiratory  Result Value Ref Range Status   Specimen Description   Final    SPUTUM Performed at [facility], [address], [place], [place] [address]    Special Requests   Final     EXPSU Performed at [facility], [address] [place], [place] [address]    Gram Stain   Final    ABUNDANT SQUAMOUS EPITHELIAL CELLS PRESENT MODERATE GRAM POSITIVE COCCI MODERATE GRAM NEGATIVE RODS    Culture   Final    Normal respiratory flora-no Staph aureus or Pseudomonas seen Performed at [facility], [address] [place], [place] [address]    Report Status 04/18/2024 FINAL  Final  Respiratory (~20 pathogens) panel by PCR     Status: None   Collection Time: 04/16/24 11:42 AM   Specimen: Expectorated Sputum; Respiratory  Result Value Ref Range Status   Adenovirus NOT DETECTED NOT DETECTED Final   Coronavirus 229E NOT DETECTED NOT DETECTED Final    Comment: (NOTE) The Coronavirus on the Respiratory  Panel, DOES NOT test for the novel  Coronavirus (2019 nCoV)    Coronavirus HKU1 NOT DETECTED NOT DETECTED Final   Coronavirus NL63 NOT DETECTED NOT DETECTED Final   Coronavirus OC43 NOT DETECTED NOT DETECTED Final   Metapneumovirus NOT DETECTED NOT DETECTED Final   Rhinovirus / Enterovirus NOT DETECTED NOT DETECTED Final   Influenza A NOT  DETECTED NOT DETECTED Final   Influenza B NOT DETECTED NOT DETECTED Final   Parainfluenza Virus 1 NOT DETECTED NOT DETECTED Final   Parainfluenza Virus 2 NOT DETECTED NOT DETECTED Final   Parainfluenza Virus 3 NOT DETECTED NOT DETECTED Final   Parainfluenza Virus 4 NOT DETECTED NOT DETECTED Final   Respiratory Syncytial Virus NOT DETECTED NOT DETECTED Final   Bordetella pertussis NOT DETECTED NOT DETECTED Final   Bordetella Parapertussis NOT DETECTED NOT DETECTED Final   Chlamydophila pneumoniae NOT DETECTED NOT DETECTED Final   Mycoplasma pneumoniae NOT DETECTED NOT DETECTED Final    Comment: Performed at [facility], [address], [place], [place] [address]    Coagulation Studies: No results for input(s): LABPROT, INR in the last 72 hours.    Urinalysis: No results for input(s): COLORURINE, LABSPEC, PHURINE, GLUCOSEU, HGBUR, BILIRUBINUR, KETONESUR, PROTEINUR, UROBILINOGEN, NITRITE, LEUKOCYTESUR in the last 72 hours.  Invalid input(s): APPERANCEUR    Imaging: No results found.     Medications:       amLODipine   10 mg Oral Daily   aspirin  EC  81 mg Oral Daily   Chlorhexidine  Gluconate Cloth  6 each Topical Daily   clopidogrel   75 mg Oral Daily   feeding supplement  237 mL Oral TID BM   hydrALAZINE   100 mg Oral Q6H   metoprolol  tartrate  25 mg Oral BID   multivitamin with minerals  1 tablet Oral Daily   polyethylene glycol  17 g Oral Daily   sodium chloride  flush  3 mL Intravenous Q12H   acetaminophen , docusate sodium , fentaNYL  (SUBLIMAZE ) injection, hydrALAZINE , ondansetron  (ZOFRAN ) IV,  mouth rinse, sodium chloride  flush, traMADol   Assessment/ Plan:  78 y.o. male with past medical history of peripheral arterial disease, hypertension, abdominal aortic aneurysm, hyperlipidemia, bladder cancer, thrombocytopenia who was admitted with hypertensive crisis, abdominal aneurysm, endoleak of aortic graft.  1.  Acute kidney injury/chronic kidney disease stage IIIb with proteinuria -baseline creatinine 2.2 with EGFR 30 on 01/16/2024.  Acute kidney injury likely multifactorial with contribution from hypoperfusion from bilateral renal artery stenosis, and exposure to contrast with CT angio chest/pelvis/abdomen.   08/03 0701 - 08/04 0700 In: 6 [I.V.:6] Out: 1400 [Urine:1400] Lab Results  Component Value Date   CREATININE 2.61 (H) 04/29/2024   CREATININE 2.61 (H) 04/28/2024   CREATININE 2.69 (H) 04/27/2024  Electrolytes and volume status are acceptable.  No acute indication for dialysis at present. We will continue to monitor daily and follow labs on a regular basis.  2.  Hypertension with chronic kidney disease:  Continue amlodipine , hydralazine , and metoprolol   - Systolic blood pressure now in the 140s to 150s range.  Hydralazine  dose was increased recently. We can consider adding low-dose ARB.  3.  Bilateral renal artery stenosis in the setting of large type Ib endoleak, history of abdominal aortic aneurysm repair Patient is status post repair by Dr. Jama on 8/1 Will need outpatient nephrology follow up.    LOS: 14 Jarrad Mclees 8/4/20257:52 AM

## 2024-04-29 NOTE — Progress Notes (Signed)
 Physical Therapy Treatment Patient Details Name: Nathan Rivera MRN: [id number] DOB: October 06, 1945 Today's Date: 04/29/2024   History of Present Illness Pt is a 78 y.o male with PMH that includes: Infrarenal abdominal aortic aneurysm s/p endovascular repair of a ruptured AAA in 2011 on Xarelto , PAD, HTN, duodenal ulcer with perforation s/p omental patch repair, CKD stage III, thrombocytopenia, HDL, prior tobacco use, ESBL bacteremia, bladder cancer, chronic back pain, and bilateral inguinal hernia s/p repair who presented to the ED with chief complaints of abdominal pain and hemoptysis.  MD assessment includes: hemoptysis, endoleak with aneurysm expansion, HTN emergency, AKI on CKD, CAP, and mild cognitive impairment. Patient is s/p treatment of type Ib endoleak with position of the left internal iliac artery and extension of the left external iliac artery as well as left renal angiogram and angioplasty 04/26/24.    PT Comments  Pt seen for PT tx with pt agreeable, daughter then wife present for session. Pt slightly more irritable with decreased PO intake per family. Pt requires min assist with multimodal cuing for bed mobility & sit>stand. Pt ambulates around bed with RW & CGA<>min assist with decreased awareness re: proper use of AD. Pt with incontinent BM, requiring total assist for peri hygiene, continued to have incontinent BM & pt unaware. Pt ultimately assisted on BSC in care of NT to finish toileting. Recommend post acute rehab <3 hours therapy/day at this time as family unable to provide current level of care. Hopeful that pt can progress to HHPT with more therapy.    If plan is discharge home, recommend the following: Assist for transportation;Help with stairs or ramp for entrance;A little help with walking and/or transfers;A lot of help with bathing/dressing/bathroom;Assistance with cooking/housework;Direct supervision/assist for medications management   Can travel by private vehicle     Yes   Equipment Recommendations  Rolling walker (2 wheels)    Recommendations for Other Services       Precautions / Restrictions Precautions Precautions: Fall Restrictions Weight Bearing Restrictions Per Provider Order: No     Mobility  Bed Mobility Overal bed mobility: Needs Assistance Bed Mobility: Supine to Sit     Supine to sit: Used rails, HOB elevated, Min assist     General bed mobility comments: multimodal cuing to initiate    Transfers Overall transfer level: Needs assistance Equipment used: Rolling walker (2 wheels) Transfers: Sit to/from Stand, Bed to chair/wheelchair/BSC Sit to Stand: Contact guard assist, Min assist   Step pivot transfers: Min assist (cuing re: hand placement when transferring recliner>BSC on L)       General transfer comment: sit>stand from EOB, recliner, extra time to turn hips to square up for transfer from EOB    Ambulation/Gait Ambulation/Gait assistance: Min assist Gait Distance (Feet): 12 Feet Assistive device: Rolling walker (2 wheels) Gait Pattern/deviations: Decreased stride length, Decreased step length - right, Decreased step length - left Gait velocity: decreased     General Gait Details: Pt ambulates around bed to recliner with RW & CGA<>Min assist, at one point letting go of walker with 1 hand & reaching for bed rail with cuing to have BUE on RW with pt demonstrating. Pt pushing RW out in front of him with decreased safety awareness.   Stairs             Wheelchair Mobility     Tilt Bed    Modified Rankin (Stroke Patients Only)       Balance Overall balance assessment: Needs assistance Sitting-balance support: Feet supported Sitting  balance-Leahy Scale: Good     Standing balance support: Bilateral upper extremity supported, During functional activity, Reliant on assistive device for balance Standing balance-Leahy Scale: Fair                              Communication    Cognition  Arousal: Alert Behavior During Therapy:  (slightly irritated)   PT - Cognitive impairments: History of cognitive impairments, Problem solving, Safety/Judgement, Memory, Sequencing, Awareness, Attention, Initiation                       PT - Cognition Comments: Hx of Alzheimer's (per family) but pt more confused with family reporting ICU delirium. Pt requires multimodal cuing to initiate tasks 2/2 decreased motor planning. Pt incontinent of BM & unaware. Following commands: Impaired Following commands impaired: Follows one step commands inconsistently, Follows one step commands with increased time    Cueing Cueing Techniques: Verbal cues, Gestural cues, Tactile cues, Visual cues  Exercises      General Comments General comments (skin integrity, edema, etc.): Pt with incontinent BM throughout session, assisted to [facility] at end in care of NTs.      Pertinent Vitals/Pain Pain Assessment Pain Assessment: No/denies pain    Home Living                          Prior Function            PT Goals (current goals can now be found in the care plan section) Acute Rehab PT Goals Patient Stated Goal: get stronger so he's safe to go home PT Goal Formulation: With family Time For Goal Achievement: 05/11/24 Potential to Achieve Goals: Fair Progress towards PT goals: Progressing toward goals    Frequency    Min 3X/week      PT Plan      Co-evaluation              AM-PAC PT 6 Clicks Mobility   Outcome Measure  Help needed turning from your back to your side while in a flat bed without using bedrails?: None Help needed moving from lying on your back to sitting on the side of a flat bed without using bedrails?: A Little Help needed moving to and from a bed to a chair (including a wheelchair)?: A Little Help needed standing up from a chair using your arms (e.g., wheelchair or bedside chair)?: A Little Help needed to walk in hospital room?: A Little Help  needed climbing 3-5 steps with a railing? : A Lot 6 Click Score: 18    End of Session Equipment Utilized During Treatment: Gait belt Activity Tolerance:  (limited by need to toilet) Patient left:  (on BSC in care of NTs with wife present)   PT Visit Diagnosis: Other abnormalities of gait and mobility (R26.89);Muscle weakness (generalized) (M62.81);Difficulty in walking, not elsewhere classified (R26.2);Unsteadiness on feet (R26.81)     Time: 8948-8883 PT Time Calculation (min) (ACUTE ONLY): 25 min  Charges:    $Therapeutic Activity: 23-37 mins PT General Charges $$ ACUTE PT VISIT: 1 Visit                     Richerd Pinal, PT, DPT 04/29/24, 11:40 AM   Richerd CHRISTELLA Pinal 04/29/2024, 11:38 AM

## 2024-04-29 NOTE — Plan of Care (Signed)
  Problem: Education: Goal: Knowledge of General Education information will improve Description: Including pain rating scale, medication(s)/side effects and non-pharmacologic comfort measures Outcome: Progressing   Problem: Clinical Measurements: Goal: Ability to maintain clinical measurements within normal limits will improve Outcome: Progressing Goal: Diagnostic test results will improve Outcome: Progressing Goal: Respiratory complications will improve Outcome: Progressing Goal: Cardiovascular complication will be avoided Outcome: Progressing   Problem: Activity: Goal: Risk for activity intolerance will decrease Outcome: Progressing   Problem: Nutrition: Goal: Adequate nutrition will be maintained Outcome: Progressing   Problem: Coping: Goal: Level of anxiety will decrease Outcome: Progressing   Problem: Elimination: Goal: Will not experience complications related to urinary retention Outcome: Progressing   Problem: Pain Managment: Goal: General experience of comfort will improve and/or be controlled Outcome: Progressing   Problem: Clinical Measurements: Goal: Will remain free from infection Outcome: Not Progressing   Problem: Elimination: Goal: Will not experience complications related to bowel motility Outcome: Not Progressing

## 2024-04-29 NOTE — Addendum Note (Signed)
 Addendum  created 04/29/24 0909 by Niki Manus SAUNDERS, CRNA   Intraprocedure Staff edited

## 2024-04-29 NOTE — Progress Notes (Signed)
 Progress Note    04/29/2024 10:54 AM 3 Days Post-Op  Subjective:  Nathan Rivera is a 78 yo male now POD #3/6 From:   04/19/24 Procedure(s) Performed:             1.  Ultrasound guidance for vascular access right femoral artery             2.  Catheter placement into right renal artery from right femoral approach             3.  Aortogram and selective right renal angiogram             4.  Balloon expandable stent placement to the right renal artery with a 7 mm diameter x 26 mm length Lifestream stent             5.  StarClose closure device right femoral artery  04/26/24 Procedure(s) Performed:             1.  Ultrasound-guided access to the femoral arteries bilaterally.             2.  Introduction catheter into aorta bilateral femoral artery approach             3.  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  Extension of the left limb of the existing EVAR into the left external iliac artery for exclusion of type Ib endoleak             5.  Selective injection of the left renal artery             6.  Angioplasty of the left renal artery using a 5 mm balloon             7.  Celt closure of the right femoral artery             8.  Perclose closure of the left common femoral artery in the preclose fashion  Vitals:   04/29/24 0700 04/29/24 0800  BP: (!) 144/74 (!) 153/77  Pulse: 78 78  Resp: 18 15  Temp:  98.3 F (36.8 C)  SpO2: 97% 96%   Physical Exam: Cardiac:  Regular Rate but bradycardic at 40-50 bpm's. No murmurs appreciated.  Lungs:  Non labored clear throughout on auscultation.  No rales rhonchi or wheezing. Incisions: Right groin without complication, dressing clean dry and intact.  Extremities: All extremities with palpable pulses and warm to touch. Abdomen: Positive bowel sounds throughout, soft, nontender and nondistended Neurologic: Alert and oriented x 3, answers all questions and follows commands  appropriately.  CBC    Component Value Date/Time   WBC 12.2 (H) 04/29/2024 0438   RBC 2.51 (L) 04/29/2024 0438   HGB 7.6 (L) 04/29/2024 0438   HGB 10.3 (L) 08/18/2013 0504   HCT 23.1 (L) 04/29/2024 0438   HCT 30.9 (L) 08/18/2013 0504   PLT 190 04/29/2024 0438   PLT 161 08/18/2013 0504   MCV 92.0 04/29/2024 0438   MCV 86 08/18/2013 0504   MCH 30.3 04/29/2024 0438   MCHC 32.9 04/29/2024 0438   RDW 15.3 04/29/2024 0438   RDW 16.0 (H) 08/18/2013 0504   LYMPHSABS 1.2 04/28/2024 0513   LYMPHSABS 2.3 08/18/2013 0504   MONOABS 1.0 04/28/2024 0513   MONOABS 1.1 (H) 08/18/2013 0504   EOSABS 0.2 04/28/2024 0513   EOSABS 0.2 08/18/2013 0504  BASOSABS 0.1 04/28/2024 0513   BASOSABS 0.0 08/18/2013 0504    BMET    Component Value Date/Time   NA 141 04/29/2024 0438   NA 144 10/23/2014 1630   K 4.0 04/29/2024 0438   K 4.1 10/23/2014 1630   CL 108 04/29/2024 0438   CL 112 (H) 10/23/2014 1630   CO2 23 04/29/2024 0438   CO2 26 10/23/2014 1630   GLUCOSE 89 04/29/2024 0438   GLUCOSE 83 10/23/2014 1630   BUN 44 (H) 04/29/2024 0438   BUN 30 (H) 10/23/2014 1630   CREATININE 2.61 (H) 04/29/2024 0438   CREATININE 1.96 (H) 10/23/2014 1630   CALCIUM 8.7 (L) 04/29/2024 0438   CALCIUM 9.0 10/23/2014 1630   GFRNONAA 24 (L) 04/29/2024 0438   GFRNONAA 36 (L) 10/23/2014 1630   GFRNONAA 8 (L) 08/18/2013 0504   GFRAA 39 (L) 10/25/2014 0352   GFRAA 44 (L) 10/23/2014 1630   GFRAA 9 (L) 08/18/2013 0504    INR    Component Value Date/Time   INR 1.1 04/23/2024 0310   INR 1.5 10/20/2014 0833     Intake/Output Summary (Last 24 hours) at 04/29/2024 1054 Last data filed at 04/29/2024 9062 Gross per 24 hour  Intake 9 ml  Output 850 ml  Net -841 ml     Assessment/Plan:  78 y.o. male is s/p SEE ABOVE  3 Days Post-Op   PLAN Patients creatinine remains elevated today. Daughter states patient is not eating and drinking. Patient started on IVF to assist in fluid intake to flush his kidneys.  PT  to continue to work with the patient. Transfer to the floor.    DVT prophylaxis:  ASA 81 mg daily and Plavix  75 mg daily   [name] [facility] 04/29/2024 10:54 AM

## 2024-04-29 NOTE — Progress Notes (Signed)
 [facility] Progress Note  Patient: Nathan Rivera    FMW:[id number]  DOA: 04/15/2024     Date of Service: the patient was seen and examined on 04/29/2024  Chief Complaint  Patient presents with   Hemoptysis   Brief hospital course: 78 y.o male with significant PMH of Infrarenal abdominal aortic aneurysm s/p endovascular repair of a ruptured AAA in 2011 on Xarelto , PAD, HTN, duodenal ulcer with perforation s/p omental patch repair, CKD stage III, thrombocytopenia, HDL, prior tobacco use, ESBL bacteremia, bladder cancer, chronic back pain, and bilateral inguinal hernia s/p repair who presented to the ED with chief complaints of abdominal pain and hemoptysis.   Family at bedside reports that he took his last dose of Xarelto  yesterday evening.  He subsequently developed some abdominal pain around the epigastric and left upper quadrant region.  Since onset of symptoms has been coughing up bright red blood which he describes  blood filling up his mouth.  Denies other associated symptoms   ED Course: Initial vital signs showed HR of 68 beats/minute, BP 192/102 mm Hg, the RR 18 breaths/minute, and the oxygen saturation 93% on RA and a temperature of 98.56F (36.7C). Pertinent Labs/Diagnostics Findings: Na+/ K+:140/4.2 Glucose:94  BUN/Cr.:39/2.43 TAR:Lwmzfjmxjaoz  PCT: negative <0.10  BNP: 394.5  CTA Chest> CT Abd/pelvis> Saccular aneurysm at the level of the proximal stent with some soft tissue thickening and stranding concerning possible endoleak or impending rupture,Large aneurysm involving the left common iliac artery, partially encompasses the left iliac stent, Aneurysmal dilatation of the right common iliac artery at the distal end of the stent  Multiple small aneurysms of the proximal internal and iliac vessels.    CT findings discussed with on-call vascular Dr. Marea who recommended hemodynamic monitoring as it is unlikely patient will require emergent intervention but may require repair  during the course of admission. PCCM consulted for admission, BP management and close monitoring.   Significant Hospital Events:  7/21: admit to ICU, on clevidipine  and esmolol  7/22: no recurrence of hemoptysis, but he continues to have a cough. EGD normal without any PUD or sign of GI bleed 7/23: worsening renal function, holding on surgical intervention 7/24: kidney function stable, ileus, N/V in the AM. Sat in chair 7/25: slept well overnight, kidney function improved. No complaints. Underwent renal artery stenting. 7/26: passing flatus, no abdominal pain 7/27: delirium, no pain reported but he is confused today 07/28: Patient complaining of new back pain, appears to be muscular. Remains on clevidipine  and emolol drip for strict bp control.  07/31: Patient remains stable with improved kidney function.  08/01: s/p endoleak repair by Dr. Dreama.   Patient was transferred under TRH service on 04/28/2024.  Please review prior notes for details and further management as below.  Assessment and Plan: # Hemoptysis # Endoleak with Aneurysm Expansion # History of Infrarenal AAA s/p endovascular repeair s/p R renal artery stenting 07/25 with improvement in AKI. S/p endoleak repair 08/01 but unable to engag left renal artery and this remains unsalvageable .  bilateral severe renal artery stenosis, severe celiac artery stenosis, infrarenal abdominal aortoiliac stent graft, aneurysmal dilation of the abdominal aorta at the level of the renal arteries with a saccular component concerning for leakage vs impending rupture, left common iliac artery aneurysm.  Given renal disease and need for contrast, endoleak repair is delayed, and he underwent renal artery stenosis stenting.  S/p EGD negative for any PUD or GI bleed. Patient was on Xarelto , which which has been held since admission.  Restart DOAC (xarelto  20 home dose) when cleared by vascular surgery Continue aspirin  and Plavix  in the meantime   #  HTN emergency Continue with goal SBP of 130-140 mmHg and HR < 60. - Metoprolol  tartrate 25mg  BID - Amlodipine  10mg  Daily  - Hydralazine  100mg  Q6H scheduled, increased dose on 8/3 as per nephro    # AKI on CKD improving baseline Cr. 2.2 mg/dl.  With bilateral renal artery stenosis AKI most likely due to contrast-induced nephropathy. Creatinine 2.61 Renal function is gradually improving Monitor BMP daily 8/4 started IV fluid for gentle hydration as per vascular surgery  # Acute blood loss anemia 8/4 Hb 7.6 Monitor CBC daily   # Community Acquired Pneumonia Viral panel negative, s/p Ceftriaxone  and Azithromycin  for CAP coverage completed course. Sputum culture no growth  No recurrence of his hemoptysis/ Stable.  WBC count trending down.  Procalcitonin 0.3 negative on 8/3  # Mild Cognitive Impairment history of mild cognitive impairment, holding memantine and donepezil  given bradycardia.   # Hypophosphatemia,  Could not replete due to abnormal renal function Monitor and replete as needed  Body mass index is 30.47 kg/m.  Interventions:  Diet: Heart healthy diet DVT Prophylaxis: SCD, pharmacological prophylaxis contraindicated due to risk of bleeding   Advance goals of care discussion: Full code  Family Communication: family was present at bedside, at the time of interview.  The pt provided permission to discuss medical plan with the family. Opportunity was given to ask question and all questions were answered satisfactorily.   Disposition:  Pt is from home, admitted with AAA leak status post repair and right renal artery stenting.  Gradually improving, downgraded from PCCM to TRH on 8/3, which precludes a safe discharge. Discharge to Home, when stable and cleared by vascular surgery, may need few more days to improve.  Subjective: No significant events overnight, patient was laying already, he said he is always in pain, well-controlled with current medications.  Denied any  specific concerns  Physical Exam: General: NAD, lying comfortably Appear in no distress, affect appropriate Eyes: PERRLA ENT: Oral Mucosa Clear, moist  Neck: no JVD,  Cardiovascular: S1 and S2 Present, no Murmur,  Respiratory: good respiratory effort, Bilateral Air entry equal and Decreased, no Crackles, no wheezes Abdomen: Bowel Sound present, Soft and no tenderness,  Skin: no rashes Extremities: no Pedal edema, no calf tenderness Neurologic: without any new focal findings Gait not checked due to patient safety concerns  Vitals:   04/29/24 0600 04/29/24 0700 04/29/24 0800 04/29/24 1150  BP: (!) 151/77 (!) 144/74 (!) 153/77   Pulse: 71 78 78   Resp: 17 18 15    Temp:   98.3 F (36.8 C) (!) 97 F (36.1 C)  TempSrc:   Oral Oral  SpO2: 94% 97% 96%   Weight:      Height:        Intake/Output Summary (Last 24 hours) at 04/29/2024 1528 Last data filed at 04/29/2024 9062 Gross per 24 hour  Intake 9 ml  Output 850 ml  Net -841 ml   Filed Weights   04/26/24 0431 04/27/24 0500 04/28/24 0334  Weight: 96 kg 94.2 kg 93.6 kg    Data Reviewed: I have personally reviewed and interpreted daily labs, tele strips, imagings as discussed above. I reviewed all nursing notes, pharmacy notes, vitals, pertinent old records I have discussed plan of care as described above with RN and patient/family.  CBC: Recent Labs  Lab 04/23/24 0311 04/24/24 0329 04/25/24 0224 04/27/24 9366 04/27/24  0901 04/27/24 1622 04/28/24 0513 04/28/24 1353 04/29/24 0438  WBC 8.1 8.8   < > 14.5* 15.1* 14.4* 12.5*  --  12.2*  NEUTROABS 5.5 5.9  --   --   --   --  10.0*  --   --   HGB 12.2* 12.2*   < > 8.7* 8.5* 8.3* 7.5* 7.7* 7.6*  HCT 36.8* 37.3*   < > 26.1* 26.1* 24.7* 22.5* 23.1* 23.1*  MCV 90.9 91.2   < > 93.2 93.2 92.5 92.6  --  92.0  PLT 138* 144*   < > 187 197 197 172  --  190   < > = values in this interval not displayed.   Basic Metabolic Panel: Recent Labs  Lab 04/24/24 0329 04/25/24 0224  04/26/24 0518 04/26/24 0521 04/27/24 0633 04/28/24 0513 04/29/24 0438  NA 139 139 140  --  140 139 141  K 3.6 4.0 3.9  --  4.7 4.0 4.0  CL 110 109 109  --  110 108 108  CO2 20* 21* 23  --  20* 21* 23  GLUCOSE 91 76 83  --  104* 90 89  BUN 36* 33* 33*  --  46* 49* 44*  CREATININE 2.15* 1.91* 1.94*  --  2.69* 2.61* 2.61*  CALCIUM 8.4* 8.7* 8.6*  --  8.2* 8.4* 8.7*  MG 2.2 2.1  --  2.2  --  2.3 2.4  PHOS 3.0 2.6 2.8  --  5.1* 2.9 2.1*    Studies: No results found.  Scheduled Meds:  amLODipine   10 mg Oral Daily   aspirin  EC  81 mg Oral Daily   [START ON 04/30/2024] bisacodyl   10 mg Oral QHS   Chlorhexidine  Gluconate Cloth  6 each Topical Daily   clopidogrel   75 mg Oral Daily   feeding supplement  237 mL Oral TID BM   hydrALAZINE   100 mg Oral Q6H   metoprolol  tartrate  25 mg Oral BID   multivitamin with minerals  1 tablet Oral Daily   polyethylene glycol  17 g Oral BID   sodium chloride  flush  3 mL Intravenous Q12H   Continuous Infusions:  sodium chloride  100 mL/hr at 04/29/24 1149   PRN Meds: acetaminophen , bisacodyl , docusate sodium , fentaNYL  (SUBLIMAZE ) injection, hydrALAZINE , ondansetron  (ZOFRAN ) IV, mouth rinse, sodium chloride  flush, traMADol   Time spent: 40 minutes  Author: ELVAN SOR. MD [facility] 04/29/2024 3:28 PM  To reach On-call, see care teams to locate the attending and reach out to them via [email or web address]. If 7PM-7AM, please contact night-coverage If you still have difficulty reaching the attending provider, please page the [facility] (Director on Call) for [facility] on amion for assistance.

## 2024-04-29 NOTE — TOC Progression Note (Signed)
 Transition of Care [facility]) - Progression Note    Patient Details  Name: Nathan Rivera MRN: [id number] Date of Birth: Mar 16, 1946  [facility] CM/SW Contact  Marinda Cooks, RN Phone Number: 04/29/2024, 1:50 PM  Clinical Narrative:     This CM arrived bedside introduced role and spoke with pt who was accompanied by his daughter Lauraine and discussed dc plans & HH agency options/ choice  per request of pt & his wife. This CM confirmed HH referral can be accepted by [facility] &  provided update to pt and his daughter  who agreed with this [facility] agency being coordinated and confirmed when pt is medically cleared to dc home. TOC will cont to follow dc planning / care coordination and update as applicable.  Expected Discharge Plan: Home w Home Health Services                 Expected Discharge Plan and Services       Living arrangements for the past 2 months: Single Family Home                                       Social Drivers of Health (SDOH) Interventions SDOH Screenings   Food Insecurity: No Food Insecurity (04/16/2024)  Housing: Low Risk  (04/16/2024)  Transportation Needs: No Transportation Needs (04/16/2024)  Utilities: Not At Risk (04/16/2024)  Financial Resource Strain: Low Risk  (07/19/2023)   Received from [facility]  Social Connections: Unknown (04/17/2024)  Tobacco Use: High Risk (04/19/2024)    Readmission Risk Interventions     No data to display

## 2024-04-29 NOTE — Progress Notes (Signed)
 Occupational Therapy Treatment Patient Details Name: Nathan Rivera MRN: [id number] DOB: [birth year] Today's Date: 04/29/2024   History of present illness Pt is a 78 y.o male with PMH that includes: Infrarenal abdominal aortic aneurysm s/p endovascular repair of a ruptured AAA in 2011 on Xarelto , PAD, HTN, CKD stage III, thrombocytopenia, HDL, bladder cancer, and bilateral inguinal hernia s/p repair. Pt presented to the ED with chief complaints of abdominal pain and hemoptysis.  Patient is s/p treatment of type Ib endoleak with position of the left internal iliac artery and extension of the left external iliac artery as well as left renal angiogram and angioplasty 04/26/24.   OT comments  Nathan Rivera was seen for OT treatment on this date. Upon arrival to room pt in bed, agreeable to tx. Noted to be incontinent of urine with pt removing purewick - NT notified. Pt requires MIN A bed mobility and sit<>stand at EOB. Repeatedly self-initiates return to bed x3. MOD A don/doff gown in sitting. MAX A don/doff B socks in sitting. Pt progress limited by cognition, will continue to follow POC. On discharge pt would benefit from return to familiar environment with 24/7 supervision; if unable to have 24/7 supervision recommend STR.       If plan is discharge home, recommend the following:  A little help with walking and/or transfers;A little help with bathing/dressing/bathroom;Direct supervision/assist for medications management;Direct supervision/assist for financial management;Assist for transportation;Supervision due to cognitive status;Assistance with cooking/housework   Equipment Recommendations  BSC/3in1;Hospital bed    Recommendations for Other Services      Precautions / Restrictions Precautions Precautions: Fall Recall of Precautions/Restrictions: Impaired Restrictions Weight Bearing Restrictions Per Provider Order: No       Mobility Bed Mobility Overal bed mobility: Needs Assistance Bed  Mobility: Supine to Sit, Sit to Supine     Supine to sit: Min assist Sit to supine: Contact guard assist        Transfers Overall transfer level: Needs assistance Equipment used: 1 person hand held assist Transfers: Sit to/from Stand Sit to Stand: Min assist                 Balance Overall balance assessment: Needs assistance Sitting-balance support: Feet supported Sitting balance-Leahy Scale: Good     Standing balance support: Single extremity supported Standing balance-Leahy Scale: Fair                             ADL either performed or assessed with clinical judgement   ADL Overall ADL's : Needs assistance/impaired                                       General ADL Comments: MOD A don/doff gown in sitting. MAX A don/doff B socks in sitting.     Extremity/Trunk Assessment              Occupational psychologist Communication: Impaired Factors Affecting Communication: Difficulty expressing self   Cognition Arousal: Lethargic Behavior During Therapy: Flat affect, Impulsive Cognition: Cognition impaired   Orientation impairments: Time, Situation         OT - Cognition Comments: repeatedly returns to bed and closes eyes however follows commands with step by step instructions  Following commands: Impaired Following commands impaired: Follows one step commands inconsistently, Follows one step commands with increased time      Cueing   Cueing Techniques: Verbal cues, Gestural cues, Tactile cues, Visual cues  Exercises      Shoulder Instructions       General Comments Pt with incontinent BM throughout session, assisted to [facility] at end in care of NTs.    Pertinent Vitals/ Pain       Pain Assessment Pain Assessment: PAINAD Breathing: normal Negative Vocalization: none Facial Expression: smiling or inexpressive Body Language: tense, distressed  pacing, fidgeting Consolability: distracted or reassured by voice/touch PAINAD Score: 2  Home Living                                          Prior Functioning/Environment              Frequency  Min 2X/week        Progress Toward Goals  OT Goals(current goals can now be found in the care plan section)  Progress towards OT goals: Progressing toward goals  Acute Rehab OT Goals OT Goal Formulation: With patient/family Time For Goal Achievement: 05/11/24 Potential to Achieve Goals: Good ADL Goals Pt Will Perform Grooming: with supervision;standing Pt Will Perform Lower Body Dressing: with supervision;sit to/from stand Pt Will Transfer to Toilet: with supervision;ambulating Pt Will Perform Toileting - Clothing Manipulation and hygiene: with supervision;sit to/from stand  Plan      Co-evaluation                 AM-PAC OT 6 Clicks Daily Activity     Outcome Measure   Help from another person eating meals?: None Help from another person taking care of personal grooming?: A Little Help from another person toileting, which includes using toliet, bedpan, or urinal?: A Little Help from another person bathing (including washing, rinsing, drying)?: A Lot Help from another person to put on and taking off regular upper body clothing?: A Little Help from another person to put on and taking off regular lower body clothing?: A Lot 6 Click Score: 17    End of Session    OT Visit Diagnosis: Unsteadiness on feet (R26.81);Other abnormalities of gait and mobility (R26.89);Repeated falls (R29.6);Muscle weakness (generalized) (M62.81);Other symptoms and signs involving cognitive function   Activity Tolerance Patient tolerated treatment well   Patient Left in bed;with call bell/phone within reach   Nurse Communication Mobility status        Time: 8596-8579 OT Time Calculation (min): 17 min  Charges: OT General Charges $OT Visit: 1 Visit OT  Treatments $Self Care/Home Management : 8-22 mins  [name], M.S. OTR/L  04/29/24, 3:35 PM  ascom [phone]

## 2024-04-29 NOTE — Progress Notes (Signed)
 Pt transferred. Alert and oriented to self only. VSS. RA. All patient belongings kept at bedside transferred with patient.

## 2024-04-30 ENCOUNTER — Other Ambulatory Visit: Payer: Self-pay

## 2024-04-30 DIAGNOSIS — T82310A Breakdown (mechanical) of aortic (bifurcation) graft (replacement), initial encounter: Secondary | ICD-10-CM | POA: Diagnosis not present

## 2024-04-30 LAB — TYPE AND SCREEN
ABO/RH(D): A POS
Antibody Screen: NEGATIVE
Unit division: 0
Unit division: 0

## 2024-04-30 LAB — BASIC METABOLIC PANEL WITH GFR
Anion gap: 7 (ref 5–15)
BUN: 41 mg/dL — ABNORMAL HIGH (ref 8–23)
CO2: 22 mmol/L (ref 22–32)
Calcium: 8.4 mg/dL — ABNORMAL LOW (ref 8.9–10.3)
Chloride: 111 mmol/L (ref 98–111)
Creatinine, Ser: 2.34 mg/dL — ABNORMAL HIGH (ref 0.61–1.24)
GFR, Estimated: 28 mL/min — ABNORMAL LOW (ref >60)
Glucose, Bld: 90 mg/dL (ref 70–99)
Potassium: 3.7 mmol/L (ref 3.5–5.1)
Sodium: 140 mmol/L (ref 135–145)

## 2024-04-30 LAB — GLUCOSE, CAPILLARY
Glucose-Capillary: 109 mg/dL — ABNORMAL HIGH (ref 70–99)
Glucose-Capillary: 80 mg/dL (ref 70–99)
Glucose-Capillary: 85 mg/dL (ref 70–99)
Glucose-Capillary: 92 mg/dL (ref 70–99)
Glucose-Capillary: 93 mg/dL (ref 70–99)

## 2024-04-30 LAB — BPAM RBC
Blood Product Expiration Date: 202509022359
Blood Product Expiration Date: 202509022359
Unit Type and Rh: 6200
Unit Type and Rh: 6200

## 2024-04-30 LAB — MAGNESIUM: Magnesium: 2.3 mg/dL (ref 1.7–2.4)

## 2024-04-30 LAB — CBC
HCT: 25.2 % — ABNORMAL LOW (ref 39.0–52.0)
Hemoglobin: 8 g/dL — ABNORMAL LOW (ref 13.0–17.0)
MCH: 30.1 pg (ref 26.0–34.0)
MCHC: 31.7 g/dL (ref 30.0–36.0)
MCV: 94.7 fL (ref 80.0–100.0)
Platelets: 217 K/uL (ref 150–400)
RBC: 2.66 MIL/uL — ABNORMAL LOW (ref 4.22–5.81)
RDW: 15.3 % (ref 11.5–15.5)
WBC: 12 K/uL — ABNORMAL HIGH (ref 4.0–10.5)
nRBC: 0.2 % (ref 0.0–0.2)

## 2024-04-30 LAB — PHOSPHORUS: Phosphorus: 1.9 mg/dL — ABNORMAL LOW (ref 2.5–4.6)

## 2024-04-30 MED ORDER — METOPROLOL TARTRATE 25 MG PO TABS
25.0000 mg | ORAL_TABLET | Freq: Two times a day (BID) | ORAL | 2 refills | Status: DC
  Filled 2024-04-30: qty 60, 30d supply, fill #0

## 2024-04-30 MED ORDER — POLYETHYLENE GLYCOL 3350 17 GM/SCOOP PO POWD
17.0000 g | Freq: Two times a day (BID) | ORAL | 0 refills | Status: AC
  Filled 2024-04-30: qty 238, 7d supply, fill #0

## 2024-04-30 MED ORDER — AMLODIPINE BESYLATE 10 MG PO TABS
10.0000 mg | ORAL_TABLET | Freq: Every day | ORAL | 3 refills | Status: AC
  Filled 2024-04-30: qty 30, 30d supply, fill #0

## 2024-04-30 MED ORDER — BISACODYL 5 MG PO TBEC
10.0000 mg | DELAYED_RELEASE_TABLET | Freq: Every day | ORAL | 0 refills | Status: AC
  Filled 2024-04-30: qty 30, 15d supply, fill #0

## 2024-04-30 MED ORDER — HYDRALAZINE HCL 100 MG PO TABS
100.0000 mg | ORAL_TABLET | Freq: Four times a day (QID) | ORAL | 2 refills | Status: DC
  Filled 2024-04-30: qty 90, 23d supply, fill #0

## 2024-04-30 MED ORDER — ASPIRIN 81 MG PO TBEC
81.0000 mg | DELAYED_RELEASE_TABLET | Freq: Every day | ORAL | 12 refills | Status: AC
  Filled 2024-04-30: qty 30, 30d supply, fill #0

## 2024-04-30 MED ORDER — BISACODYL 10 MG RE SUPP
10.0000 mg | Freq: Every day | RECTAL | 0 refills | Status: DC | PRN
  Filled 2024-04-30: qty 12, 12d supply, fill #0

## 2024-04-30 MED ORDER — POTASSIUM PHOSPHATES 15 MMOLE/5ML IV SOLN
30.0000 mmol | Freq: Once | INTRAVENOUS | Status: AC
  Administered 2024-04-30: 30 mmol via INTRAVENOUS
  Filled 2024-04-30: qty 10

## 2024-04-30 NOTE — Progress Notes (Signed)
 [facility]  ROUNDING NOTE   Subjective:   Patient with past medical history of peripheral arterial disease, hypertension, abdominal aortic aneurysm, hyperlipidemia, bladder cancer, thrombocytopenia who was admitted with hypertensive crisis, abdominal aneurysm, type I endoleak of aortic graft.  Status post iliac artery repair with coil embolization by Dr. Jama 04/26/24   Wife at bedside Patient seen resting in bed Appetite appropriate Remains on room air  Creatinine 2.34 Urine output 1.1 L  Objective:  Vital signs in last 24 hours:  Temp:  [97.6 F (36.4 C)-98.7 F (37.1 C)] 98.7 F (37.1 C) (08/05 1205) Pulse Rate:  [64-73] 66 (08/05 1205) Resp:  [15-20] 18 (08/05 0417) BP: (136-161)/(67-87) 136/87 (08/05 1205) SpO2:  [94 %-98 %] 95 % (08/05 1205) Weight:  [92.1 kg] 92.1 kg (08/05 0500)  Weight change:  Filed Weights   04/27/24 0500 04/28/24 0334 04/30/24 0500  Weight: 94.2 kg 93.6 kg 92.1 kg    Intake/Output: I/O last 3 completed shifts: In: 1110.5 [I.V.:1110.5] Out: 1950 [Urine:1950]   Intake/Output this shift:  Total I/O In: 120 [P.O.:120] Out: 750 [Urine:750]  Physical Exam: General: No acute distress  Head: Normocephalic, atraumatic. Moist oral mucosal membranes  Neck: Supple  Lungs:  Clear  Heart: Regular   Abdomen:  Soft, nontender, bowel sounds present  Extremities: no peripheral edema.  Neurologic: Awake, alert, following commands  Skin: No acute rash  Access: No hemodialysis access    Basic Metabolic Panel: Recent Labs  Lab 04/25/24 0224 04/26/24 0518 04/26/24 0521 04/27/24 9366 04/28/24 0513 04/29/24 0438 04/30/24 0433  NA 139 140  --  140 139 141 140  K 4.0 3.9  --  4.7 4.0 4.0 3.7  CL 109 109  --  110 108 108 111  CO2 21* 23  --  20* 21* 23 22  GLUCOSE 76 83  --  104* 90 89 90  BUN 33* 33*  --  46* 49* 44* 41*  CREATININE 1.91* 1.94*  --  2.69* 2.61* 2.61* 2.34*  CALCIUM 8.7* 8.6*  --  8.2* 8.4* 8.7* 8.4*  MG 2.1   --  2.2  --  2.3 2.4 2.3  PHOS 2.6 2.8  --  5.1* 2.9 2.1* 1.9*    Liver Function Tests: Recent Labs  Lab 04/25/24 0224 04/26/24 0518 04/27/24 0633  ALBUMIN  2.9* 3.0* 2.8*   No results for input(s): LIPASE, AMYLASE in the last 168 hours. No results for input(s): AMMONIA in the last 168 hours.  CBC: Recent Labs  Lab 04/24/24 0329 04/25/24 0224 04/27/24 0901 04/27/24 1622 04/28/24 0513 04/28/24 1353 04/29/24 0438 04/30/24 0433  WBC 8.8   < > 15.1* 14.4* 12.5*  --  12.2* 12.0*  NEUTROABS 5.9  --   --   --  10.0*  --   --   --   HGB 12.2*   < > 8.5* 8.3* 7.5* 7.7* 7.6* 8.0*  HCT 37.3*   < > 26.1* 24.7* 22.5* 23.1* 23.1* 25.2*  MCV 91.2   < > 93.2 92.5 92.6  --  92.0 94.7  PLT 144*   < > 197 197 172  --  190 217   < > = values in this interval not displayed.    Cardiac Enzymes: No results for input(s): CKTOTAL, CKMB, CKMBINDEX, TROPONINI in the last 168 hours.  BNP: Invalid input(s): POCBNP  CBG: Recent Labs  Lab 04/29/24 2016 04/30/24 0000 04/30/24 0419 04/30/24 0735 04/30/24 1206  GLUCAP 87 85 80 92 93  Microbiology: Results for orders placed or performed during the hospital encounter of 04/15/24  Blood culture (routine x 2)     Status: None   Collection Time: 04/15/24  8:55 PM   Specimen: BLOOD  Result Value Ref Range Status   Specimen Description BLOOD BLOOD RIGHT ARM  Final   Special Requests   Final    BOTTLES DRAWN AEROBIC AND ANAEROBIC Blood Culture adequate volume   Culture   Final    NO GROWTH 5 DAYS Performed at [facility], [address] [place], [place] [address]    Report Status 04/20/2024 FINAL  Final  Blood culture (routine x 2)     Status: None   Collection Time: 04/15/24  8:55 PM   Specimen: BLOOD  Result Value Ref Range Status   Specimen Description BLOOD BLOOD LEFT ARM  Final   Special Requests   Final    BOTTLES DRAWN AEROBIC AND ANAEROBIC Blood Culture results may not be optimal due to an  inadequate volume of blood received in culture bottles   Culture   Final    NO GROWTH 5 DAYS Performed at [facility], [address] [place], [place] [address]    Report Status 04/20/2024 FINAL  Final  MRSA Next Gen by PCR, Nasal     Status: None   Collection Time: 04/15/24 10:01 PM   Specimen: Nasal Mucosa; Nasal Swab  Result Value Ref Range Status   MRSA by PCR Next Gen NOT DETECTED NOT DETECTED Final    Comment: (NOTE) The GeneXpert MRSA Assay (FDA approved for NASAL specimens only), is one component of a comprehensive MRSA colonization surveillance program. It is not intended to diagnose MRSA infection nor to guide or monitor treatment for MRSA infections. Test performance is not FDA approved in patients less than 67 years old. Performed at [facility], [address] [place], [place] [address]   Culture, Respiratory w Gram Stain     Status: None   Collection Time: 04/16/24 11:42 AM   Specimen: SPU; Respiratory  Result Value Ref Range Status   Specimen Description   Final    SPUTUM Performed at [facility], [address] [place], [place] [address]    Special Requests   Final     EXPSU Performed at [facility], [address] [place], [place] [address]    Gram Stain   Final    ABUNDANT SQUAMOUS EPITHELIAL CELLS PRESENT MODERATE GRAM POSITIVE COCCI MODERATE GRAM NEGATIVE RODS    Culture   Final    Normal respiratory flora-no Staph aureus or Pseudomonas seen Performed at [facility], [address] [place], [place] [address]    Report Status 04/18/2024 FINAL  Final  Respiratory (~20 pathogens) panel by PCR     Status: None   Collection Time: 04/16/24 11:42 AM   Specimen: Expectorated Sputum; Respiratory  Result Value Ref Range Status   Adenovirus NOT DETECTED NOT DETECTED Final   Coronavirus 229E NOT DETECTED NOT DETECTED Final    Comment: (NOTE) The Coronavirus on the Respiratory Panel, DOES NOT test for the  novel  Coronavirus (2019 nCoV)    Coronavirus HKU1 NOT DETECTED NOT DETECTED Final   Coronavirus NL63 NOT DETECTED NOT DETECTED Final   Coronavirus OC43 NOT DETECTED NOT DETECTED Final   Metapneumovirus NOT DETECTED NOT DETECTED Final   Rhinovirus / Enterovirus NOT DETECTED NOT DETECTED Final   Influenza A NOT DETECTED NOT DETECTED Final   Influenza B NOT  DETECTED NOT DETECTED Final   Parainfluenza Virus 1 NOT DETECTED NOT DETECTED Final   Parainfluenza Virus 2 NOT DETECTED NOT DETECTED Final   Parainfluenza Virus 3 NOT DETECTED NOT DETECTED Final   Parainfluenza Virus 4 NOT DETECTED NOT DETECTED Final   Respiratory Syncytial Virus NOT DETECTED NOT DETECTED Final   Bordetella pertussis NOT DETECTED NOT DETECTED Final   Bordetella Parapertussis NOT DETECTED NOT DETECTED Final   Chlamydophila pneumoniae NOT DETECTED NOT DETECTED Final   Mycoplasma pneumoniae NOT DETECTED NOT DETECTED Final    Comment: Performed at [facility], [address], [place], [place] [address]    Coagulation Studies: No results for input(s): LABPROT, INR in the last 72 hours.    Urinalysis: No results for input(s): COLORURINE, LABSPEC, PHURINE, GLUCOSEU, HGBUR, BILIRUBINUR, KETONESUR, PROTEINUR, UROBILINOGEN, NITRITE, LEUKOCYTESUR in the last 72 hours.  Invalid input(s): APPERANCEUR    Imaging: No results found.     Medications:    potassium PHOSPHATE  IVPB (in mmol) 30 mmol (04/30/24 1219)      amLODipine   10 mg Oral Daily   aspirin  EC  81 mg Oral Daily   bisacodyl   10 mg Oral QHS   Chlorhexidine  Gluconate Cloth  6 each Topical Daily   feeding supplement  237 mL Oral TID BM   hydrALAZINE   100 mg Oral Q6H   metoprolol  tartrate  25 mg Oral BID   multivitamin with minerals  1 tablet Oral Daily   polyethylene glycol  17 g Oral BID   rivaroxaban   20 mg Oral Q supper   sodium chloride  flush  3 mL Intravenous Q12H   acetaminophen , bisacodyl , docusate sodium ,  fentaNYL  (SUBLIMAZE ) injection, hydrALAZINE , ondansetron  (ZOFRAN ) IV, mouth rinse, sodium chloride  flush, traMADol   Assessment/ Plan:  78 y.o. male with past medical history of peripheral arterial disease, hypertension, abdominal aortic aneurysm, hyperlipidemia, bladder cancer, thrombocytopenia who was admitted with hypertensive crisis, abdominal aneurysm, endoleak of aortic graft.  1.  Acute kidney injury/chronic kidney disease stage IIIb with proteinuria -baseline creatinine 2.2 with EGFR 30 on 01/16/2024.  Acute kidney injury likely multifactorial with contribution from hypoperfusion from bilateral renal artery stenosis, and exposure to contrast with CT angio chest/pelvis/abdomen.   08/04 0701 - 08/05 0700 In: 1104.5 [I.V.:1104.5] Out: 1100 [Urine:1100] Lab Results  Component Value Date   CREATININE 2.34 (H) 04/30/2024   CREATININE 2.61 (H) 04/29/2024   CREATININE 2.61 (H) 04/28/2024  Renal function has returned to baseline with adequate urine output.  Patient will need follow-up in our office at discharge for continued monitoring.  2.  Hypertension with chronic kidney disease:  Continue amlodipine , hydralazine , and metoprolol   - Blood pressure control has improved, 136/87. We can consider adding low-dose ARB as outpatient.  3.  Bilateral renal artery stenosis in the setting of large type Ib endoleak, history of abdominal aortic aneurysm repair Patient is status post repair by Dr. Jama on 8/1 Will need outpatient urology follow up.    LOS: 15 Graceanne Guin 8/5/20253:08 PM

## 2024-04-30 NOTE — TOC Progression Note (Signed)
 Transition of Care [facility]) - Progression Note    Patient Details  Name: Nathan Rivera MRN: [id number] Date of Birth: January 10, 1946  Transition of Care [facility]) CM/SW Contact  Lauraine [name], LCSW Phone Number: 04/30/2024, 1:42 PM  Clinical Narrative:   Donn confirmed they will resume home health services at discharge. Patient was active with PT, RN prior to admission and they can add OT. DME recommendations for RW, 3-in-1, and hospital bed. Patient already has a RW, BSC, built-in shower seat, and shower wand at home. Wife declined hospital bed. Wife and daughter will transport patient home at discharge.  Expected Discharge Plan: Home w Home Health Services                 Expected Discharge Plan and Services       Living arrangements for the past 2 months: Single Family Home                                       Social Drivers of Health (SDOH) Interventions SDOH Screenings   Food Insecurity: No Food Insecurity (04/16/2024)  Housing: Low Risk  (04/16/2024)  Transportation Needs: No Transportation Needs (04/16/2024)  Utilities: Not At Risk (04/16/2024)  Financial Resource Strain: Low Risk  (07/19/2023)   Received from [facility]  Social Connections: Unknown (04/17/2024)  Tobacco Use: High Risk (04/19/2024)    Readmission Risk Interventions     No data to display

## 2024-04-30 NOTE — Progress Notes (Signed)
 Physical Therapy Treatment Patient Details Name: Nathan Rivera MRN: [id number] DOB: 07/22/1946 Today's Date: 04/30/2024   History of Present Illness Pt is a 78 y.o male with PMH that includes: Infrarenal abdominal aortic aneurysm s/p endovascular repair of a ruptured AAA in 2011 on Xarelto , PAD, HTN, CKD stage III, thrombocytopenia, HDL, bladder cancer, and bilateral inguinal hernia s/p repair. Pt presented to the ED with chief complaints of abdominal pain and hemoptysis.  Patient is s/p treatment of type Ib endoleak with position of the left internal iliac artery and extension of the left external iliac artery as well as left renal angiogram and angioplasty 04/26/24.    PT Comments  Pt was wide awake, supine in bed, upon arrival. He is oriented to self only and needed a lot of encouragement to participate. Eventually agreeable however pt becomes agitated throughout session which greatly limited session progression. He is unaware of situation but was able to exit bed and stand several times. His unwillingness to attempt further mobility greatly limited session. I think that's enough for right now. Pt quickly returns to bed without physical assistance. Pt is not at his baseline and continues to have cognition deficits that hinder PT progress. Per chart, Pt/family are planning to DC home form acute hospital. Highly recommend 24/7 assistance and supervision. Pt will continue to benefit form skilled PT to maximize his independence and safety with all ADLs while decreasing caregiver burden.     If plan is discharge home, recommend the following: Assist for transportation;Help with stairs or ramp for entrance;A little help with walking and/or transfers;A lot of help with bathing/dressing/bathroom;Assistance with cooking/housework;Direct supervision/assist for medications management     Equipment Recommendations  Rolling walker (2 wheels)       Precautions / Restrictions Precautions Precautions:  Fall Recall of Precautions/Restrictions: Impaired Restrictions Weight Bearing Restrictions Per Provider Order: No     Mobility  Bed Mobility Overal bed mobility: Needs Assistance Bed Mobility: Supine to Sit, Sit to Supine  Supine to sit: Contact guard, Min assist Sit to supine: Supervision General bed mobility comments: Pt is mostly limited by cognition and willingness to perform desired task. He needed max encouragement to fully participate. Does have some coordination/poor motor planning observedhowever only min assist to actually achieve EOB short sitting.    Transfers Overall transfer level: Needs assistance Equipment used: Rolling walker (2 wheels) Transfers: Sit to/from Stand Sit to Stand: Min assist  General transfer comment: Pt was able to stand 2 x EOB however on 2nd attempt becomes more agitated and demands getting back into bed. I think thats enough of this right now. Pt frustrated/agitated and quickly returns to supine position without physical assistance. Session greatly limited by pt aggressiveness/ agitation    Ambulation/Gait  General Gait Details: pt unwilling and gets agitated when encouraged to attempt ambulation. Pt is limited by cognition/behaviors moreso than strength/function mobility limitations    Balance Overall balance assessment: Needs assistance Sitting-balance support: Feet supported Sitting balance-Leahy Scale: Fair Sitting balance - Comments: pt did have one episode of posterior LOB into bed while seated EOB   Standing balance support: Bilateral upper extremity supported, During functional activity Standing balance-Leahy Scale: Fair Standing balance comment: limited assessment due to pt's participation. Quickly sat back down right after standing       Communication Communication Communication: Impaired Factors Affecting Communication: Difficulty expressing self  Cognition Arousal: Alert Behavior During Therapy: Agitated   PT - Cognitive  impairments: History of cognitive impairments, Problem solving, Safety/Judgement, Memory, Sequencing, Awareness,  Attention, Initiation   Orientation impairments: Place, Time, Situation    PT - Cognition Comments: Hx of Alzheimer's (per family) but pt more confused. He is fully alert but easily gets agitated Following commands: Impaired Following commands impaired: Follows one step commands inconsistently, Follows one step commands with increased time    Cueing Cueing Techniques: Verbal cues, Tactile cues         Pertinent Vitals/Pain Pain Assessment Pain Assessment: No/denies pain     PT Goals (current goals can now be found in the care plan section) Acute Rehab PT Goals Patient Stated Goal: go home Progress towards PT goals: Not progressing toward goals - comment (limited progress due to pt's participation/cognition)    Frequency    Min 3X/week           Co-evaluation     PT goals addressed during session: Mobility/safety with mobility;Balance;Proper use of DME;Strengthening/ROM        AM-PAC PT 6 Clicks Mobility   Outcome Measure  Help needed turning from your back to your side while in a flat bed without using bedrails?: None Help needed moving from lying on your back to sitting on the side of a flat bed without using bedrails?: A Little Help needed moving to and from a bed to a chair (including a wheelchair)?: A Little Help needed standing up from a chair using your arms (e.g., wheelchair or bedside chair)?: A Little Help needed to walk in hospital room?: A Lot Help needed climbing 3-5 steps with a railing? : A Lot 6 Click Score: 17    End of Session   Activity Tolerance: Treatment limited secondary to agitation Patient left: in bed;with call bell/phone within reach;with bed alarm set;with family/visitor present Nurse Communication: Mobility status PT Visit Diagnosis: Other abnormalities of gait and mobility (R26.89);Muscle weakness (generalized)  (M62.81);Difficulty in walking, not elsewhere classified (R26.2);Unsteadiness on feet (R26.81)     Time: 9259-9246 PT Time Calculation (min) (ACUTE ONLY): 13 min  Charges:    $Therapeutic Activity: 8-22 mins PT General Charges $$ ACUTE PT VISIT: 1 Visit                    Rankin Essex PTA 04/30/24, 8:15 AM

## 2024-04-30 NOTE — TOC Transition Note (Signed)
 Transition of Care [facility]) - Discharge Note   Patient Details  Name: Nathan Rivera MRN: [id number] Date of Birth: [birth year]  Transition of Care [facility]) CM/SW Contact:  Lauraine [name], LCSW Phone Number: 04/30/2024, 3:44 PM   Clinical Narrative:  Patient has orders to discharge home today. [facility] liaison is aware. No further concerns. CSW signing off.   Final next level of care: Home w Home Health Services Barriers to Discharge: Barriers Resolved   Patient Goals and CMS Choice     Choice offered to / list presented to : Spouse      Discharge Placement                Patient to be transferred to facility by: Wife and daughter Name of family member notified: [name] Patient and family notified of of transfer: 04/30/24  Discharge Plan and Services Additional resources added to the After Visit Summary for                                       Social Drivers of Health (SDOH) Interventions SDOH Screenings   Food Insecurity: No Food Insecurity (04/16/2024)  Housing: Low Risk  (04/16/2024)  Transportation Needs: No Transportation Needs (04/16/2024)  Utilities: Not At Risk (04/16/2024)  Financial Resource Strain: Low Risk  (07/19/2023)   Received from [facility]  Social Connections: Unknown (04/17/2024)  Tobacco Use: High Risk (04/19/2024)     Readmission Risk Interventions     No data to display

## 2024-04-30 NOTE — Discharge Summary (Signed)
 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 Follow up LABS/TEST:  Repeat CBC and BMP after 1 week   Follow-up Information     Marea Selinda RAMAN, MD Follow up in 6 week(s).   Specialties: Vascular Surgery, Radiology, Interventional Cardiology Why: EVAR with iliac stents. Contact information: [address] [place] [address] [phone]         Care, [facility] Follow up.   Why: They will resume home health services at discharge. Contact information: [address] [place] [address] [phone]         Jeffie Cheryl BRAVO, MD Follow up in 1 week(s).   Specialty: Family Medicine Contact information: [address] [facility] [place] [address] [phone]         Dennise Capri, MD Follow up in 1 week(s).   Specialty: Nephrology Contact information: [address] [place] [address] [phone]                Diet recommendation: Cardiac diet  Activity: The patient is advised to gradually reintroduce usual activities, as tolerated  Discharge Condition: stable  Code Status: Full code   History of present illness: As per the H and P dictated  on admission.  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    Family at bedside reports that he took his last dose of Xarelto  yesterday evening.  He subsequently developed some abdominal pain around the epigastric and left upper quadrant region.  Since onset of symptoms has been coughing up bright red blood which he describes  blood filling up his mouth.  Denies other associated symptoms   ED Course: Initial vital signs showed HR of 68 beats/minute, BP 192/102 mm Hg, the RR 18 breaths/minute, and the oxygen saturation 93% on RA and a temperature of 98.12F (36.7C). Pertinent Labs/Diagnostics Findings: Na+/ K+:140/4.2 Glucose:94  BUN/Cr.:39/2.43 TAR:[id number]  PCT: negative <0.10  BNP: 394.5  CTA Chest> CT Abd/pelvis> Saccular aneurysm at the level of the proximal stent with some soft tissue thickening and stranding concerning possible endoleak or impending rupture,Large aneurysm involving the left common iliac artery, partially encompasses the left iliac stent, Aneurysmal dilatation of the right common iliac artery at the distal end of the stent  Multiple small aneurysms of the proximal internal and iliac vessels.  CT findings discussed with on-call vascular Dr. Marea who recommended hemodynamic monitoring as it is unlikely patient will require emergent intervention but may require repair during the course of admission. PCCM consulted for admission, BP management and close monitoring.   Significant Hospital Events:  7/21: admit to ICU, on clevidipine  and esmolol  7/22: no recurrence of  hemoptysis, but he continues to have a cough. EGD normal without any PUD or sign of GI bleed 7/23: worsening renal function, holding on surgical intervention 7/24: kidney function stable, ileus, N/V in the AM. Sat in chair 7/25: slept well overnight, kidney function improved. No complaints. Underwent renal artery stenting. 7/26: passing flatus, no abdominal pain 7/27: delirium, no pain reported but he is confused today 07/28: Patient complaining of new back pain, appears to be muscular. Remains on clevidipine  and emolol drip for strict bp control.  07/31: Patient remains stable with improved kidney function.  08/01: s/p endoleak repair by Dr. Dreama.    Patient was transferred under [facility] service on 04/28/2024.  Please review prior notes for details and further management as below.   Assessment and Plan: # Hemoptysis # Endoleak with Aneurysm Expansion # History of Infrarenal AAA s/p endovascular repeair s/p R renal artery stenting 07/25 with improvement in AKI. S/p endoleak repair 08/01 but unable to engag left renal artery and this remains unsalvageable .  bilateral severe renal artery stenosis, severe celiac artery stenosis, infrarenal abdominal aortoiliac stent graft, aneurysmal dilation of the abdominal aorta at the level of the renal arteries with a saccular component concerning for leakage vs impending rupture, left common iliac artery aneurysm.  Given renal disease and need for contrast, endoleak repair was delayed, and he underwent renal artery stenosis stenting.  S/p EGD negative for any PUD or GI bleed. Patient was on Xarelto , which which has been held since admission.   Restarted DOAC (xarelto  20 home dose) on 8/5 and continued aspirin , discontinued Plavix  as per vascular surgery.   Patient seemed stable and cleared by vascular surgery to discharge home today.  Patient and family agreed with the discharge planning.  # HTN emergency Continue with goal SBP of 130-140 mmHg and HR <  60. - Metoprolol  tartrate 25mg  BID - Amlodipine  10mg  Daily  - Hydralazine  100mg  Q6H scheduled, increased dose on 8/3 as per nephro  Monitor BP at home and follow with PCP to titrate medication accordingly  # AKI on CKD improving baseline Cr. 2.2 mg/dl.  With bilateral renal artery stenosis AKI most likely due to contrast-induced nephropathy. Creatinine 2.61>> 2.34 slightly improved after IV fluid given for hydration.  Continue oral hydration.  Follow with PCP and nephrology as an outpatient in 1 week and repeat BMP.  # Acute blood loss anemia 8/5 Hb 8.0 today, stable.     # Community Acquired Pneumonia Viral panel negative, s/p Ceftriaxone  and Azithromycin  for CAP coverage completed course. Sputum culture no growth  No recurrence of his hemoptysis/ Stable.  WBC count trending down.  Procalcitonin 0.3 negative on 8/3   # Mild Cognitive Impairment: history of mild cognitive impairment, resumed memantine and donepezil .  Continue to monitor heart rate, if becomes bradycardic then medications can be stopped.  Follow with PCP.   # Hypophosphatemia: Repleted.   Body mass index is 29.98 kg/m.  Nutrition Interventions:  - Patient was instructed, not to drive, operate heavy machinery, perform activities at heights, swimming or participation in water activities or provide baby sitting services while on Pain, Sleep and Anxiety Medications; until his outpatient Physician  has advised to do so again.  - Also recommended to not to take more than prescribed Pain, Sleep and Anxiety Medications.  Patient was seen by physical therapy, who recommended Home health, which was arranged. On the day of the discharge the patient's vitals were stable, and no other acute medical condition were reported by patient. the patient was felt safe to be discharge at Home with Home health.  Consultants: PCCM, vascular surgery, nephrology, GI Procedures: EGD, left renal artery stent, endoleak repair done by Dr. Veria   Type Ib endoleak with a 5 cm left common iliac artery aneurysm. Greater than 95% stenosis of the left renal artery.  Discharge Exam: General: Appear in no distress, no Rash; Oral Mucosa Clear, moist. Cardiovascular: S1 and S2 Present, no Murmur, Respiratory: normal respiratory effort, Bilateral Air entry present and no Crackles, no wheezes Abdomen: Bowel Sound present, Soft and no tenderness, no hernia Extremities: no Pedal edema, no calf tenderness Neurology: alert and oriented to time, place, and person affect appropriate.  Filed Weights   04/27/24 0500 04/28/24 0334 04/30/24 0500  Weight: 94.2 kg 93.6 kg 92.1 kg   Vitals:   04/30/24 0734 04/30/24 1205  BP: (!) 151/68 136/87  Pulse: 73 66  Resp:    Temp: 98.7 F (37.1 C) 98.7 F (37.1 C)  SpO2: 94% 95%    DISCHARGE MEDICATION: Allergies as of 04/30/2024       Reactions   Duloxetine Dermatitis        Medication List     STOP taking these medications    amoxicillin -clavulanate 875-125 MG tablet Commonly known as: AUGMENTIN    [employer] Probiotic Acidophilus 10 MG Caps   pregabalin 100 MG capsule Commonly known as: LYRICA       TAKE these medications    acetaminophen  325 MG tablet Commonly known as: TYLENOL  Take 325 mg by mouth every 6 (six) hours as needed for mild pain.   amLODipine  10 MG tablet Commonly known as: NORVASC  Take 1 tablet (10 mg total) by mouth daily. What changed:  medication strength how much to take   aspirin  EC 81 MG tablet Take 1 tablet (81 mg total) by mouth daily. Swallow whole. Start taking on: May 01, 2024   bisacodyl  5 MG EC tablet Commonly known as: DULCOLAX Take 2 tablets (10 mg total) by mouth at bedtime.   bisacodyl  10 MG suppository Commonly known as: DULCOLAX Place 1 suppository (10 mg total) rectally daily as needed for severe constipation.   cholecalciferol 1000 units tablet Commonly known as: VITAMIN D Take 1,000 Units by mouth daily.   donepezil  10 MG  tablet Commonly known as: ARICEPT  Take 10 mg by mouth at bedtime.   FOLIC ACID PO Take by mouth.   gabapentin  100 MG capsule Commonly known as: NEURONTIN  200 mg 2 (two) times daily. 200 mg in am and 200 mg in PM   hydrALAZINE  100 MG tablet Commonly known as: APRESOLINE  Take 1 tablet (100 mg total) by mouth every 6 (six) hours.   memantine 10 MG tablet Commonly known as: NAMENDA TAKE 1 TABLET BY MOUTH TWICE DAILY FOR MEMORY LOSS   metoprolol  tartrate 25 MG tablet Commonly known as: LOPRESSOR  Take 1 tablet (25 mg total) by mouth 2 (two) times daily.   pantoprazole  40 MG tablet Commonly known as: PROTONIX  Take 40 mg by mouth at bedtime.   polyethylene glycol 17 g packet Commonly known as: MIRALAX  / GLYCOLAX  Take 17 g by mouth 2 (two) times daily.   simvastatin   20 MG tablet Commonly known as: ZOCOR  Take 20 mg by mouth at bedtime.   vitamin B-12 500 MCG tablet Commonly known as: CYANOCOBALAMIN Take 500 mcg by mouth daily. What changed: Another medication with the same name was removed. Continue taking this medication, and follow the directions you see here.   Xarelto  20 MG Tabs tablet Generic drug: rivaroxaban  daily.       Allergies  Allergen Reactions   Duloxetine Dermatitis   Discharge Instructions     Call MD for:  difficulty breathing, headache or visual disturbances   Complete by: As directed    Call MD for:  extreme fatigue   Complete by: As directed    Call MD for:  persistant dizziness or light-headedness   Complete by: As directed    Call MD for:  persistant nausea and vomiting   Complete by: As directed    Call MD for:  redness, tenderness, or signs of infection (pain, swelling, redness, odor or green/yellow discharge around incision site)   Complete by: As directed    Call MD for:  severe uncontrolled pain   Complete by: As directed    Call MD for:  temperature >100.4   Complete by: As directed    Diet - low sodium heart healthy   Complete by:  As directed    Discharge instructions   Complete by: As directed    Follow-up with PCP in 1 week Follow-up with vascular surgery in 1 week Follow-up with nephrology in 1 week Repeat CBC and BMP after 1 week Monitor BP at home and follow-up with the PCP to titrate medications accordingly.   Increase activity slowly   Complete by: As directed        The results of significant diagnostics from this hospitalization (including imaging, microbiology, ancillary and laboratory) are listed below for reference.    Significant Diagnostic Studies: PERIPHERAL VASCULAR CATHETERIZATION Result Date: 04/26/2024 See surgical note for result.  PERIPHERAL VASCULAR CATHETERIZATION Result Date: 04/19/2024 See surgical note for result.  DG Chest Port 1 View Result Date: 04/18/2024 CLINICAL DATA:  Respiratory failure. EXAM: PORTABLE CHEST 1 VIEW COMPARISON:  04/17/2024 FINDINGS: Stable asymmetric elevation right hemidiaphragm with right base atelectasis/infiltrate and probable small right pleural effusion. There is pulmonary vascular congestion without overt pulmonary edema. Cardiopericardial silhouette is at upper limits of normal for size. No acute bony abnormality. Telemetry leads overlie the chest. IMPRESSION: 1. Stable asymmetric elevation right hemidiaphragm with right base atelectasis/infiltrate and probable small right pleural effusion. 2. Pulmonary vascular congestion without overt pulmonary edema. Electronically Signed   By: Camellia Candle M.D.   On: 04/18/2024 08:58   CT ABDOMEN PELVIS WO CONTRAST Result Date: 04/17/2024 CLINICAL DATA:  Ileus or obstruction. EXAM: CT ABDOMEN AND PELVIS WITHOUT CONTRAST TECHNIQUE: Multidetector CT imaging of the abdomen and pelvis was performed following the standard protocol without IV contrast. RADIATION DOSE REDUCTION: This exam was performed according to the departmental dose-optimization program which includes automated exposure control, adjustment of the mA and/or  kV according to patient size and/or use of iterative reconstruction technique. COMPARISON:  CT dated 04/15/2024. FINDINGS: Evaluation of this exam is limited in the absence of intravenous contrast. Lower chest: There is consolidation of the majority of the visualized right middle and right lower lobes. Debris noted in the right mainstem bronchus and right middle and right lower lobes. There is left lung base subpleural atelectasis. There is coronary vascular calcification. No intra-abdominal free air.  Small subhepatic fluid. Hepatobiliary: The liver is unremarkable.  Vicarious excretion of contrast from prior study noted in the gallbladder. There is a stone in the cystic duct. No pericholecystic fluid or evidence of acute cholecystitis by CT. Pancreas: The pancreas is atrophic.  No active inflammatory changes. Spleen: Small scattered calcified splenic granuloma. Adrenals/Urinary Tract: The adrenal glands are unremarkable. Moderately atrophic kidneys. Small renal cysts and additional subcentimeter hypodense lesions which are too small to characterize. A 2 cm high attenuating lesion from the superior pole of the right kidney is not characterized but may represent a complex/proteinaceous cyst. This is similar to prior CT. Further evaluation with ultrasound on a nonemergent/outpatient basis recommended. There is no hydronephrosis on either side. Residual contrast noted in the renal collecting systems bilaterally as well as in the urinary bladder. The visualized ureters and urinary bladder appear unremarkable. Stomach/Bowel: There is diffuse colonic diverticulosis. No active inflammation. There is diffuse dilatation of small-bowel loops measuring up to 3.8 cm. There is no discrete transition. A gradual transition noted in the right lower quadrant. Findings favor to represent an ileus and less likely a partial small bowel obstruction. Small-bowel series may provide better evaluation. The appendix is normal.  Vascular/Lymphatic: An aorto bi iliac endovascular stent graft repair noted. The aorta is tortuous. There is a 3.7 cm aneurysmal dilatation of the infrarenal abdominal aorta. Bilateral common iliac aneurysms measure up to 4 cm on the left. The IVC is unremarkable. No portal venous gas. There is no adenopathy. Reproductive: The prostate is grossly unremarkable. Other: None Musculoskeletal: Osteopenia with degenerative changes of the spine and hips. No acute osseous pathology. IMPRESSION: 1. Diffuse dilatation of small-bowel loops without discrete transition. Findings favored to represent an ileus and less likely a partial small bowel obstruction. Small-bowel series may provide better evaluation. 2. Colonic diverticulosis. Normal appendix. 3. Cholelithiasis. 4. Aneurysmal dilatation of the abdominal aorta measure up to 3.7 cm. An aorto bi iliac endovascular stent graft repair is seen. Recommend follow-up ultrasound every 3 years. (Ref.: J Vasc Surg. 2018; 67:2-77 and J Am Coll Radiol 2013;10(10):789-794.) 5. Debris in the bronchus intermedius with probable postobstructive atelectasis or pneumonia in the right middle and right lower lobes. Follow-up to resolution recommended. Electronically Signed   By: Vanetta Chou M.D.   On: 04/17/2024 18:37   DG Abd 1 View Result Date: 04/17/2024 CLINICAL DATA:  Abdominal pain. EXAM: ABDOMEN - 1 VIEW COMPARISON:  September 16, 2013. FINDINGS: Mildly dilated small bowel loops are noted without colonic dilatation. Mild amount of stool seen throughout the colon. Status post stent graft repair of abdominal aorta. IMPRESSION: Mild small bowel dilatation is noted concerning for distal small bowel obstruction or possibly ileus. Electronically Signed   By: Lynwood Landy Raddle M.D.   On: 04/17/2024 14:52   DG Chest Port 1 View Result Date: 04/17/2024 CLINICAL DATA:  Respiratory failure with hypoxia. EXAM: PORTABLE CHEST 1 VIEW COMPARISON:  April 15, 2024. FINDINGS: Stable  cardiomediastinal silhouette. Mild central pulmonary vascular congestion is noted. Elevated right hemidiaphragm is noted with minimal right basilar subsegmental atelectasis. Bony thorax is unremarkable. IMPRESSION: Mild central pulmonary vascular congestion is noted. Elevated right hemidiaphragm with minimal right basilar subsegmental atelectasis. Electronically Signed   By: Lynwood Landy Raddle M.D.   On: 04/17/2024 14:50   CT Angio Chest/Abd/Pel for Dissection W and/or Wo Contrast Result Date: 04/15/2024 CLINICAL DATA:  Coughing up blood EXAM: CT ANGIOGRAPHY CHEST, ABDOMEN AND PELVIS TECHNIQUE: Non-contrast CT of the chest was initially obtained. Multidetector CT imaging through the chest, abdomen and pelvis was performed  using the standard protocol during bolus administration of intravenous contrast. Multiplanar reconstructed images and MIPs were obtained and reviewed to evaluate the vascular anatomy. RADIATION DOSE REDUCTION: This exam was performed according to the departmental dose-optimization program which includes automated exposure control, adjustment of the mA and/or kV according to patient size and/or use of iterative reconstruction technique. CONTRAST:  80mL OMNIPAQUE  IOHEXOL  350 MG/ML SOLN COMPARISON:  CT 10/20/2022 FINDINGS: CTA CHEST FINDINGS Cardiovascular: Non contrasted images of the chest demonstrate no acute intramural hematoma. Negative for aortic dissection. Focal outpouching measuring 14 mm at the distal arch either representing prominent ductus diverticulum or aneurysm, but sludge least stable size compared with noncontrast CT from 2015. No extravasation identified within the chest. Coronary vascular calcification. Normal cardiac size. No pericardial effusion. Mediastinum/Nodes: Patent trachea. No thyroid mass. No suspicious lymph nodes. Esophagus within normal limits. Calcified nodes consistent with prior granulomatous disease. Lungs/Pleura: No pleural effusion or pneumothorax. Patchy mild  peribronchovascular ground-glass disease. No consolidation. Musculoskeletal: Sternum appears intact. No acute osseous abnormality. Review of the MIP images confirms the above findings. CTA ABDOMEN AND PELVIS FINDINGS VASCULAR Aorta: Aortic atherosclerosis. Status post infrarenal abdominal aortoiliac stent graft. The proximal end of the stent is just below the level of the renal arteries. Aneurysmal dilatation of the abdominal aorta at the level of the renal arteries, slightly superior to the proximal end of the stent, this measures 4.6 x 3.7 cm on axial series 6, image 180, previously 4.2 by 3.2 cm. Saccular appearing component of the aneurysm at the level of the stent, coronal series 11, image 74, this appears increased in size compared to the non contrasted exam from January and there may be some soft tissue thickening along the saccular portion of the aneurysm on coronal series 11, image 73. No dissection is seen. 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 Possible small 5 mm aneurysm at the origin of the celiac artery, series 12, image 108. SMA: Mild-to-moderate stenosis of the proximal SMA just distal to the origin, series 12, image 111. Distal vessel patency without dissection. Renals: Accessory upper pole right renal artery with severe stenosis at the origin, series 11, image 80. 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 String like flow at the proximal right renal artery, series 11 image 75, consistent with severe stenosis over a 12 mm length. Small accessory upper pole renal artery on the left with suspected severe stenosis at the origin. Dominant left renal artery with severe stenosis at the origin, series 6, image 172 and coronal series 11 image 74 and 75. IMA: Poorly visible, small vessel supplying the perirectal is traceable to the iliac stent supplying the left iliac vessel. Inflow: Large aneurysm involving the left  common iliac artery, partially encompasses the left iliac stent, this measures up to 3.8 cm. Aneurysmal dilatation of the right common iliac artery at the distal end of the stent measuring up to 2.8 cm on series 11, image 73. Multiple small aneurysms of the proximal internal and iliac vessels. No high-grade stenosis or occlusive disease of the distal external iliac vessels. Veins: Suboptimally assessed Review of the MIP images confirms the above findings. NON-VASCULAR Hepatobiliary: Small gallstone. No biliary dilatation. No focal hepatic abnormality. Pancreas: Unremarkable. No pancreatic ductal dilatation or surrounding inflammatory changes. Spleen: Multiple calcified granuloma Adrenals/Urinary Tract: Adrenal glands are within normal limits. Kidneys show no hydronephrosis. Multiple renal cysts for which no imaging follow-up is recommended. Slightly thick-walled urinary  bladder. Indeterminate slightly dense exophytic lesion off the upper pole right kidney measuring 17 mm, series 6, image 168. Stomach/Bowel: The stomach is non enlarged. There is no dilated small bowel. No acute bowel wall thickening. Negative appendix. Diverticular disease of the colon. Lymphatic: No suspicious lymph nodes. Reproductive: Negative prostate Other: Negative for pelvic effusion or free air. Previously noted bilateral inguinal hernias are resolved/repair. Musculoskeletal: No acute or suspicious osseous abnormality. Review of the MIP images confirms the above findings. IMPRESSION: 1. Negative for acute aortic dissection or intramural hematoma. No extravasation identified within the chest, abdomen, or pelvis. 2. Status post infrarenal abdominal aortoiliac stent graft. Aneurysmal dilatation of the abdominal aorta at the level of the renal arteries, slightly superior to the proximal end of the stent, this measures 4.6 x 3.7 cm, previously 4.2 x 3.2 cm. Saccular appearing component of the aneurysm at the level of the proximal stent appears  increased in size compared to the non contrasted exam from January and there may be some soft tissue thickening and stranding along the inferior aspect of the saccular part of the aneurysm raising concern for possible leakage or impending rupture, vascular surgery consultation is recommended. 3. 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 Aneurysmal dilatation of the right common iliac artery at the distal end of the stent measuring up to 2.8 cm. Multiple small aneurysms of the proximal internal and iliac vessels. 4. 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 There are small accessory upper pole renal arteries bilaterally with suspected severe stenosis at the origin of these vessels. 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 5. 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 Possible small 5 mm aneurysm at the origin of the celiac artery. 6. 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 Patchy mild peribronchovascular ground-glass disease within the lungs, suggestive of respiratory infection. No consolidative pneumonia. 8. Cholelithiasis. Diverticular disease of the colon. 9. 16 mm indeterminate exophytic lesion off the upper pole right kidney, could initially correlate with ultrasound 10. Slightly thick-walled urinary bladder, correlate for cystitis. 11. Interval bilateral inguinal hernia repair. 12. Aortic atherosclerosis. Aortic Atherosclerosis (ICD10-I70.0). Electronically Signed   By: Luke Bun M.D.   On: 04/15/2024 19:47   DG Chest 2 View Result Date: 04/15/2024 CLINICAL DATA:  Hemoptysis since last night, worsening today. Increased lower extremity swelling for 1 week. EXAM: CHEST - 2 VIEW COMPARISON:  Radiographs 08/26/2013 and 08/24/2013. Chest CT 04/01/2014. FINDINGS: The lateral view was repeated. The heart size and  mediastinal contours are normal. 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 The lungs appear clear. No pleural effusion or pneumothorax. Calcified splenic granulomas are noted. Possible old rib fractures bilaterally without acute osseous abnormality. IMPRESSION: No radiographic evidence of active cardiopulmonary process. If the patient has persistent unexplained hemoptysis, follow-up CT should be considered. Electronically Signed   By: Elsie Perone M.D.   On: 04/15/2024 15:17    Microbiology: No results found for this or any previous visit (from the past 240 hours).   Labs: CBC: Recent Labs  Lab 04/24/24 0329 04/25/24 0224 04/27/24 0901 04/27/24 1622 04/28/24 0513 04/28/24 1353 04/29/24 0438 04/30/24 0433  WBC 8.8   < > 15.1* 14.4* 12.5*  --  12.2* 12.0*  NEUTROABS 5.9  --   --   --  10.0*  --   --   --  HGB 12.2*   < > 8.5* 8.3* 7.5* 7.7* 7.6* 8.0*  HCT 37.3*   < > 26.1* 24.7* 22.5* 23.1* 23.1* 25.2*  MCV 91.2   < > 93.2 92.5 92.6  --  92.0 94.7  PLT 144*   < > 197 197 172  --  190 217   < > = values in this interval not displayed.   Basic Metabolic Panel: Recent Labs  Lab 04/25/24 0224 04/26/24 0518 04/26/24 0521 04/27/24 9366 04/28/24 0513 04/29/24 0438 04/30/24 0433  NA 139 140  --  140 139 141 140  K 4.0 3.9  --  4.7 4.0 4.0 3.7  CL 109 109  --  110 108 108 111  CO2 21* 23  --  20* 21* 23 22  GLUCOSE 76 83  --  104* 90 89 90  BUN 33* 33*  --  46* 49* 44* 41*  CREATININE 1.91* 1.94*  --  2.69* 2.61* 2.61* 2.34*  CALCIUM 8.7* 8.6*  --  8.2* 8.4* 8.7* 8.4*  MG 2.1  --  2.2  --  2.3 2.4 2.3  PHOS 2.6 2.8  --  5.1* 2.9 2.1* 1.9*   Liver Function Tests: Recent Labs  Lab 04/25/24 0224 04/26/24 0518 04/27/24 0633  ALBUMIN  2.9* 3.0* 2.8*   No results for input(s): LIPASE, AMYLASE in the last 168 hours. No results for input(s): AMMONIA in the last 168 hours. Cardiac Enzymes: No results for input(s): CKTOTAL, CKMB, CKMBINDEX,  TROPONINI in the last 168 hours. BNP (last 3 results) Recent Labs    04/15/24 1447  BNP 394.5*   CBG: Recent Labs  Lab 04/29/24 2016 04/30/24 0000 04/30/24 0419 04/30/24 0735 04/30/24 1206  GLUCAP 87 85 80 92 93    Time spent: 35 minutes  Signed:  Elvan Sor  [facility] 04/30/2024 3:26 PM

## 2024-04-30 NOTE — Progress Notes (Signed)
 Progress Note    04/30/2024 7:30 AM 4 Days Post-Op  Subjective:  Nathan Rivera is a 78 yo male now POD #3/6 From:   04/19/24 Procedure(s) Performed:             1.  Ultrasound guidance for vascular access right femoral artery             2.  Catheter placement into right renal artery from right femoral approach             3.  Aortogram and selective right renal angiogram             4.  Balloon expandable stent placement to the right renal artery with a 7 mm diameter x 26 mm length Lifestream stent             5.  StarClose closure device right femoral artery   04/26/24 Procedure(s) Performed:             1.  Ultrasound-guided access to the femoral arteries bilaterally.             2.  Introduction catheter into aorta bilateral femoral artery approach             3.  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  Extension of the left limb of the existing EVAR into the left external iliac artery for exclusion of type Ib endoleak             5.  Selective injection of the left renal artery             6.  Angioplasty of the left renal artery using a 5 mm balloon             7.  Celt closure of the right femoral artery             8.  Perclose closure of the left common femoral artery in the preclose fashion   Vitals:   04/30/24 0147 04/30/24 0417  BP: (!) 149/73 (!) 156/76  Pulse: 71 73  Resp: 20 18  Temp: 97.6 F (36.4 C) 98.2 F (36.8 C)  SpO2: 95% 98%   Physical Exam: Cardiac:  Regular Rate but bradycardic at 40-50 bpm's. No murmurs appreciated.  Lungs:  Non labored clear throughout on auscultation.  No rales rhonchi or wheezing. Incisions: Right groin without complication, dressing clean dry and intact.  Extremities: All extremities with palpable pulses and warm to touch. Abdomen: Positive bowel sounds throughout, soft, nontender and nondistended Neurologic: Alert and oriented x 3, answers all questions and  follows commands appropriately.  CBC    Component Value Date/Time   WBC 12.0 (H) 04/30/2024 0433   RBC 2.66 (L) 04/30/2024 0433   HGB 8.0 (L) 04/30/2024 0433   HGB 10.3 (L) 08/18/2013 0504   HCT 25.2 (L) 04/30/2024 0433   HCT 30.9 (L) 08/18/2013 0504   PLT 217 04/30/2024 0433   PLT 161 08/18/2013 0504   MCV 94.7 04/30/2024 0433   MCV 86 08/18/2013 0504   MCH 30.1 04/30/2024 0433   MCHC 31.7 04/30/2024 0433   RDW 15.3 04/30/2024 0433   RDW 16.0 (H) 08/18/2013 0504   LYMPHSABS 1.2 04/28/2024 0513   LYMPHSABS 2.3 08/18/2013 0504   MONOABS 1.0 04/28/2024 0513   MONOABS 1.1 (H) 08/18/2013 0504   EOSABS 0.2 04/28/2024 0513   EOSABS  0.2 08/18/2013 0504   BASOSABS 0.1 04/28/2024 0513   BASOSABS 0.0 08/18/2013 0504    BMET    Component Value Date/Time   NA 140 04/30/2024 0433   NA 144 10/23/2014 1630   K 3.7 04/30/2024 0433   K 4.1 10/23/2014 1630   CL 111 04/30/2024 0433   CL 112 (H) 10/23/2014 1630   CO2 22 04/30/2024 0433   CO2 26 10/23/2014 1630   GLUCOSE 90 04/30/2024 0433   GLUCOSE 83 10/23/2014 1630   BUN 41 (H) 04/30/2024 0433   BUN 30 (H) 10/23/2014 1630   CREATININE 2.34 (H) 04/30/2024 0433   CREATININE 1.96 (H) 10/23/2014 1630   CALCIUM 8.4 (L) 04/30/2024 0433   CALCIUM 9.0 10/23/2014 1630   GFRNONAA 28 (L) 04/30/2024 0433   GFRNONAA 36 (L) 10/23/2014 1630   GFRNONAA 8 (L) 08/18/2013 0504   GFRAA 39 (L) 10/25/2014 0352   GFRAA 44 (L) 10/23/2014 1630   GFRAA 9 (L) 08/18/2013 0504    INR    Component Value Date/Time   INR 1.1 04/23/2024 0310   INR 1.5 10/20/2014 0833     Intake/Output Summary (Last 24 hours) at 04/30/2024 0730 Last data filed at 04/30/2024 0605 Gross per 24 hour  Intake 1104.48 ml  Output 1100 ml  Net 4.48 ml     Assessment/Plan:  78 y.o. male is s/p SEE ABOVE 4 Days Post-Op   PLAN Patients Bun and creatinine have decreased today. Okay per vascular surgery if patient discharged to home. Patient will be discharged on aspirin  81  mg daily and Xarelto  20 mg daily as he was on prior to hospitalization. Patient will follow-up with vein and vascular surgery in 6 weeks with ultrasounds.     DVT prophylaxis:  ASA 81 mg daily and Xarelto  20 mg daily   [name] 04/30/2024 7:30 AM

## 2024-04-30 NOTE — Plan of Care (Signed)
   Problem: Activity: Goal: Risk for activity intolerance will decrease Outcome: Progressing   Problem: Coping: Goal: Level of anxiety will decrease Outcome: Progressing   Problem: Pain Managment: Goal: General experience of comfort will improve and/or be controlled Outcome: Progressing   Problem: Skin Integrity: Goal: Risk for impaired skin integrity will decrease Outcome: Progressing

## 2024-05-02 ENCOUNTER — Encounter (INDEPENDENT_AMBULATORY_CARE_PROVIDER_SITE_OTHER): Payer: Medicare Other

## 2024-05-02 ENCOUNTER — Ambulatory Visit (INDEPENDENT_AMBULATORY_CARE_PROVIDER_SITE_OTHER): Payer: Medicare Other | Admitting: Vascular Surgery

## 2024-05-08 ENCOUNTER — Inpatient Hospital Stay
Admission: EM | Admit: 2024-05-08 | Discharge: 2024-05-10 | DRG: 312 | Disposition: A | Discharge status: Home Health | Attending: Internal Medicine | Admitting: Internal Medicine

## 2024-05-08 DIAGNOSIS — Z8679 Personal history of other diseases of the circulatory system: Secondary | ICD-10-CM

## 2024-05-08 DIAGNOSIS — Z888 Allergy status to other drugs, medicaments and biological substances status: Secondary | ICD-10-CM | POA: Diagnosis not present

## 2024-05-08 DIAGNOSIS — N179 Acute kidney failure, unspecified: Secondary | ICD-10-CM | POA: Diagnosis present

## 2024-05-08 DIAGNOSIS — F17210 Nicotine dependence, cigarettes, uncomplicated: Secondary | ICD-10-CM | POA: Diagnosis present

## 2024-05-08 DIAGNOSIS — I959 Hypotension, unspecified: Secondary | ICD-10-CM | POA: Diagnosis present

## 2024-05-08 DIAGNOSIS — E8721 Acute metabolic acidosis: Secondary | ICD-10-CM | POA: Diagnosis present

## 2024-05-08 DIAGNOSIS — Z9841 Cataract extraction status, right eye: Secondary | ICD-10-CM | POA: Diagnosis not present

## 2024-05-08 DIAGNOSIS — R413 Other amnesia: Secondary | ICD-10-CM | POA: Diagnosis present

## 2024-05-08 DIAGNOSIS — I952 Hypotension due to drugs: Principal | ICD-10-CM | POA: Diagnosis present

## 2024-05-08 DIAGNOSIS — Z823 Family history of stroke: Secondary | ICD-10-CM | POA: Diagnosis not present

## 2024-05-08 DIAGNOSIS — Z8551 Personal history of malignant neoplasm of bladder: Secondary | ICD-10-CM

## 2024-05-08 DIAGNOSIS — Z9842 Cataract extraction status, left eye: Secondary | ICD-10-CM

## 2024-05-08 DIAGNOSIS — N189 Chronic kidney disease, unspecified: Secondary | ICD-10-CM

## 2024-05-08 DIAGNOSIS — G309 Alzheimer's disease, unspecified: Secondary | ICD-10-CM | POA: Diagnosis present

## 2024-05-08 DIAGNOSIS — D696 Thrombocytopenia, unspecified: Secondary | ICD-10-CM | POA: Diagnosis present

## 2024-05-08 DIAGNOSIS — E7800 Pure hypercholesterolemia, unspecified: Secondary | ICD-10-CM | POA: Diagnosis present

## 2024-05-08 DIAGNOSIS — F0280 Dementia in other diseases classified elsewhere without behavioral disturbance: Secondary | ICD-10-CM | POA: Diagnosis present

## 2024-05-08 DIAGNOSIS — Z7982 Long term (current) use of aspirin: Secondary | ICD-10-CM | POA: Diagnosis not present

## 2024-05-08 DIAGNOSIS — I129 Hypertensive chronic kidney disease with stage 1 through stage 4 chronic kidney disease, or unspecified chronic kidney disease: Secondary | ICD-10-CM | POA: Diagnosis present

## 2024-05-08 DIAGNOSIS — Z7901 Long term (current) use of anticoagulants: Secondary | ICD-10-CM | POA: Diagnosis not present

## 2024-05-08 DIAGNOSIS — T447X5A Adverse effect of beta-adrenoreceptor antagonists, initial encounter: Secondary | ICD-10-CM | POA: Diagnosis present

## 2024-05-08 DIAGNOSIS — Z79899 Other long term (current) drug therapy: Secondary | ICD-10-CM | POA: Diagnosis not present

## 2024-05-08 DIAGNOSIS — R001 Bradycardia, unspecified: Secondary | ICD-10-CM | POA: Diagnosis not present

## 2024-05-08 DIAGNOSIS — N1832 Chronic kidney disease, stage 3b: Secondary | ICD-10-CM | POA: Diagnosis present

## 2024-05-08 DIAGNOSIS — Z8673 Personal history of transient ischemic attack (TIA), and cerebral infarction without residual deficits: Secondary | ICD-10-CM

## 2024-05-08 DIAGNOSIS — D631 Anemia in chronic kidney disease: Secondary | ICD-10-CM | POA: Diagnosis present

## 2024-05-08 DIAGNOSIS — K219 Gastro-esophageal reflux disease without esophagitis: Secondary | ICD-10-CM | POA: Diagnosis present

## 2024-05-08 DIAGNOSIS — Z961 Presence of intraocular lens: Secondary | ICD-10-CM | POA: Diagnosis present

## 2024-05-08 DIAGNOSIS — I714 Abdominal aortic aneurysm, without rupture, unspecified: Secondary | ICD-10-CM | POA: Diagnosis present

## 2024-05-08 DIAGNOSIS — E785 Hyperlipidemia, unspecified: Secondary | ICD-10-CM | POA: Diagnosis present

## 2024-05-08 DIAGNOSIS — I739 Peripheral vascular disease, unspecified: Secondary | ICD-10-CM | POA: Diagnosis present

## 2024-05-08 LAB — TSH: TSH: 3.426 u[IU]/mL (ref 0.350–4.500)

## 2024-05-08 LAB — CBC WITH DIFFERENTIAL/PLATELET
Abs Immature Granulocytes: 0.22 K/uL — ABNORMAL HIGH (ref 0.00–0.07)
Basophils Absolute: 0.1 K/uL (ref 0.0–0.1)
Basophils Relative: 1 %
Eosinophils Absolute: 0.6 K/uL — ABNORMAL HIGH (ref 0.0–0.5)
Eosinophils Relative: 6 %
HCT: 29.2 % — ABNORMAL LOW (ref 39.0–52.0)
Hemoglobin: 9.3 g/dL — ABNORMAL LOW (ref 13.0–17.0)
Immature Granulocytes: 2 %
Lymphocytes Relative: 14 %
Lymphs Abs: 1.6 K/uL (ref 0.7–4.0)
MCH: 31.1 pg (ref 26.0–34.0)
MCHC: 31.8 g/dL (ref 30.0–36.0)
MCV: 97.7 fL (ref 80.0–100.0)
Monocytes Absolute: 0.7 K/uL (ref 0.1–1.0)
Monocytes Relative: 7 %
Neutro Abs: 7.9 K/uL — ABNORMAL HIGH (ref 1.7–7.7)
Neutrophils Relative %: 70 %
Platelets: 337 K/uL (ref 150–400)
RBC: 2.99 MIL/uL — ABNORMAL LOW (ref 4.22–5.81)
RDW: 17.1 % — ABNORMAL HIGH (ref 11.5–15.5)
WBC: 11.2 K/uL — ABNORMAL HIGH (ref 4.0–10.5)
nRBC: 0 % (ref 0.0–0.2)

## 2024-05-08 LAB — COMPREHENSIVE METABOLIC PANEL WITH GFR
ALT: 19 U/L (ref 0–44)
AST: 29 U/L (ref 15–41)
Albumin: 3.1 g/dL — ABNORMAL LOW (ref 3.5–5.0)
Alkaline Phosphatase: 71 U/L (ref 38–126)
Anion gap: 12 (ref 5–15)
BUN: 53 mg/dL — ABNORMAL HIGH (ref 8–23)
CO2: 18 mmol/L — ABNORMAL LOW (ref 22–32)
Calcium: 9 mg/dL (ref 8.9–10.3)
Chloride: 109 mmol/L (ref 98–111)
Creatinine, Ser: 3.89 mg/dL — ABNORMAL HIGH (ref 0.61–1.24)
GFR, Estimated: 15 mL/min — ABNORMAL LOW (ref >60)
Glucose, Bld: 135 mg/dL — ABNORMAL HIGH (ref 70–99)
Potassium: 3.6 mmol/L (ref 3.5–5.1)
Sodium: 139 mmol/L (ref 135–145)
Total Bilirubin: 1.4 mg/dL — ABNORMAL HIGH (ref 0.0–1.2)
Total Protein: 6.8 g/dL (ref 6.5–8.1)

## 2024-05-08 LAB — MAGNESIUM: Magnesium: 1.9 mg/dL (ref 1.7–2.4)

## 2024-05-08 LAB — TROPONIN I (HIGH SENSITIVITY)
Troponin I (High Sensitivity): 11 ng/L (ref <18)
Troponin I (High Sensitivity): 10 ng/L (ref <18)

## 2024-05-08 LAB — T4, FREE: Free T4: 1 ng/dL (ref 0.61–1.12)

## 2024-05-08 MED ORDER — PANTOPRAZOLE SODIUM 40 MG PO TBEC
40.0000 mg | DELAYED_RELEASE_TABLET | Freq: Every day | ORAL | Status: DC
  Administered 2024-05-08 – 2024-05-09 (×3): 40 mg via ORAL
  Filled 2024-05-08 (×2): qty 1

## 2024-05-08 MED ORDER — MAGNESIUM OXIDE -MG SUPPLEMENT 400 (240 MG) MG PO TABS
400.0000 mg | ORAL_TABLET | Freq: Once | ORAL | Status: AC
  Administered 2024-05-08 (×2): 400 mg via ORAL
  Filled 2024-05-08: qty 1

## 2024-05-08 MED ORDER — SODIUM CHLORIDE 0.9 % IV BOLUS
500.0000 mL | Freq: Once | INTRAVENOUS | Status: AC
  Administered 2024-05-08 (×2): 500 mL via INTRAVENOUS

## 2024-05-08 MED ORDER — POLYETHYLENE GLYCOL 3350 17 G PO PACK: 17.0000 g | PACK | Freq: Every day | ORAL | Status: DC | PRN

## 2024-05-08 MED ORDER — RIVAROXABAN 20 MG PO TABS
20.0000 mg | ORAL_TABLET | Freq: Every day | ORAL | Status: DC
  Administered 2024-05-09: 20 mg via ORAL
  Filled 2024-05-08: qty 1

## 2024-05-08 MED ORDER — ASPIRIN 81 MG PO TBEC
81.0000 mg | DELAYED_RELEASE_TABLET | Freq: Every day | ORAL | Status: DC
  Administered 2024-05-09 – 2024-05-10 (×2): 81 mg via ORAL
  Filled 2024-05-08 (×2): qty 1

## 2024-05-08 MED ORDER — ACETAMINOPHEN 650 MG RE SUPP: 650.0000 mg | Freq: Four times a day (QID) | RECTAL | Status: DC | PRN

## 2024-05-08 MED ORDER — POTASSIUM CHLORIDE CRYS ER 20 MEQ PO TBCR
40.0000 meq | EXTENDED_RELEASE_TABLET | Freq: Once | ORAL | Status: AC
  Administered 2024-05-08 (×2): 40 meq via ORAL
  Filled 2024-05-08: qty 2

## 2024-05-08 MED ORDER — ONDANSETRON HCL 4 MG PO TABS: 4.0000 mg | ORAL_TABLET | Freq: Four times a day (QID) | ORAL | Status: DC | PRN

## 2024-05-08 MED ORDER — CYANOCOBALAMIN 500 MCG PO TABS
500.0000 ug | ORAL_TABLET | Freq: Every day | ORAL | Status: DC
  Administered 2024-05-09 – 2024-05-10 (×2): 500 ug via ORAL
  Filled 2024-05-08 (×2): qty 1

## 2024-05-08 MED ORDER — ACETAMINOPHEN 325 MG PO TABS
650.0000 mg | ORAL_TABLET | Freq: Four times a day (QID) | ORAL | Status: DC | PRN
Start: 2024-05-08 — End: 2024-05-10

## 2024-05-08 MED ORDER — SIMVASTATIN 20 MG PO TABS
20.0000 mg | ORAL_TABLET | Freq: Every day | ORAL | Status: DC
  Administered 2024-05-08 – 2024-05-09 (×3): 20 mg via ORAL
  Filled 2024-05-08: qty 1
  Filled 2024-05-08: qty 2

## 2024-05-08 MED ORDER — ONDANSETRON HCL 4 MG/2ML IJ SOLN: 4.0000 mg | Freq: Four times a day (QID) | INTRAMUSCULAR | Status: DC | PRN

## 2024-05-08 NOTE — ED Triage Notes (Signed)
 Seen by Dr. Dennise today, patient creatnine low and hypotensive.  Arrives pale, skin warm and dry.

## 2024-05-08 NOTE — Group Note (Deleted)
 Date:  05/08/2024 Time:  2:22 PM  Group Topic/Focus:  Wellness Toolbox:   The focus of this group is to discuss various aspects of wellness, balancing those aspects and exploring ways to increase the ability to experience wellness.  Patients will create a wellness toolbox for use upon discharge.     Participation Level:  {B[facility] PARTICIPATION OZCZO:77735}  Participation Quality:  {BHH PARTICIPATION QUALITY:22265}  Affect:  {B[facility] AFFECT:22266}  Cognitive:  {BHH COGNITIVE:22267}  Insight: {BHH Insight2:20797}  Engagement in Group:  {B[facility] ENGAGEMENT IN HMNLE:77731}  Modes of Intervention:  {BHH MODES OF INTERVENTION:22269}  Additional Comments:  ***  Myra Curtistine BROCKS 05/08/2024, 2:22 PM

## 2024-05-08 NOTE — ED Notes (Signed)
 Pt in bed, pt voided, cleaned pt and changed depends, pt has some redness to buttocks, area blanches, padded dressing placed. Pt able to turn self.

## 2024-05-08 NOTE — Consult Note (Signed)
 [facility] CLINIC CARDIOLOGY CONSULT NOTE       Patient ID: Nathan Rivera MRN: [id number] DOB/AGE: Sep 16, 1946 78 y.o.  Admit date: 05/08/2024 Referring Physician Dr. Roann Primary Physician Feldpausch, Cheryl BRAVO, MD Primary Cardiologist None Reason for Consultation Symptomatic bradycardia  HPI: Nathan Rivera is a 78 y.o. male  with a past medical history of hypertension, PAD, AAA rupture s/p repair 2 weeks ago (hospitalization 07/21 - 08/05), dementia, CKD who presented to the ED on 05/08/2024 for bradycardia, hypotension, and generalized weakness sent from nephrology outpatient due to SBP 80s and HR 30s. Patient denies hx bradycardia. Denies lightheadedness, chest pain, SOB, palpitations or syncope. EKG in ED with sinus bradycardia, rate 40 bpm. Cardiology was consulted for further evaluation of possible symptomatic bradycardia.  Work up in the ED notable for Na 139, K 3.6, Cr 3.89, albumin  3.1, GFR 15. LFTs within normal limits. Hgb 9.3, WBC 11.2, plts 337. TSH within normal limits. Trops negative x2. EKG with sinus bradycardia, rate 40 bpm. Patient given potassium supplementation and IVFs.   At the time of my evaluation this afternoon, patient was resting comfortably in ED stretcher with daughter and wife at bedside. We discussed patient sxs in further detail. Patient recently hospitalized on 07/21 to 08/05 for AAA rupture and repair. Patient's family states pt was mainly in ICU and barely ambulated, likely deconditioned from recent hospitalization. Prior to hospitalization patient walked with cane. After hospitalization patient walking with walker and having increased difficulty ambulating over the past week due to generalized weakness/fatigue. Patient denies any lightheadedness, dizziness, palpitations, syncope, chest pain or SOB. Denies hx of bradycardia. Reports patient was seen by PCP 2 days ago and HR were reportedly low and metoprolol  was decreased from 25 BID to 12.5 BID. Currently patient  is hemodynamically stable with HR maintaining in 50s.   Review of systems complete and found to be negative unless listed above    Past Medical History:  Diagnosis Date   Alzheimer dementia [facility])    Aortic aneurysm (HCC)    Arthritis    knees are the biggest problem   Cancer (HCC) 2012   bladder   CKD (chronic kidney disease), stage III (HCC) 2014   relative to infection, septic- renal calculi   Degenerative disc disease    Degenerative joint disease    GERD (gastroesophageal reflux disease)    History of kidney stones    Hypercholesterolemia    Hypertension    no meds currently   Impaired glucose tolerance    Muscle fatigue    muscle fatigue relative to sepsis experience    Peripheral vascular disease (HCC)    Pneumonia    Raynaud's disease    Sepsis (HCC) 07/2013   Stroke (HCC)    Thromboembolism of left popilteal vein 2015   pt. unsure which leg, treating with Xarelto    UTI (urinary tract infection) 08/17/2013   Lt nephrostomy tube and stent placement    Past Surgical History:  Procedure Laterality Date   ABDOMINAL AORTIC ANEURYSM REPAIR  2011   CATARACT EXTRACTION W/PHACO Left 02/15/2021   Procedure: CATARACT EXTRACTION PHACO AND INTRAOCULAR LENS PLACEMENT (IOC) LEFT 5.59 00:44.0;  Surgeon: Myrna Adine Anes, MD;  Location: [facility];  Service: Ophthalmology;  Laterality: Left;   CATARACT EXTRACTION W/PHACO Right 03/08/2021   Procedure: CATARACT EXTRACTION PHACO AND INTRAOCULAR LENS PLACEMENT (IOC) RIGHT 1.81 00:22.7;  Surgeon: Myrna Adine Anes, MD;  Location: [facility];  Service: Ophthalmology;  Laterality: Right;   ENDOVASCULAR REPAIR/STENT  GRAFT N/A 04/26/2024   Procedure: ENDOVASCULAR REPAIR/STENT GRAFT;  Surgeon: Jama Cordella MATSU, MD;  Location: [facility] INVASIVE CV LAB;  Service: Cardiovascular;  Laterality: N/A;   ESOPHAGOGASTRODUODENOSCOPY N/A 04/16/2024   Procedure: EGD (ESOPHAGOGASTRODUODENOSCOPY);  Surgeon: Jinny Carmine, MD;  Location: [facility]  ENDOSCOPY;  Service: Endoscopy;  Laterality: N/A;   HERNIA REPAIR     21mo old   INSERTION OF MESH N/A 10/24/2014   Procedure: INSERTION OF MESH;  Surgeon: Deward Null III, MD;  Location: [facility] OR;  Service: General;  Laterality: N/A;   LAPAROTOMY N/A 08/24/2013   Procedure: EXPLORATORY LAPAROTOMY;  Surgeon: Deward GORMAN Null DOUGLAS, MD;  Location: [facility] OR;  Service: General;  Laterality: N/A;  Gram patch closure   litrotripsy  09/24/2013   NEPHROSTOMY  Nov. 2014   nephrostomy removal  Dec. 2014   PERIPHERALLY INSERTED CENTRAL CATHETER INSERTION     RENAL ANGIOGRAPHY Bilateral 04/19/2024   Procedure: RENAL ANGIOGRAPHY;  Surgeon: Marea Selinda GORMAN, MD;  Location: [facility] INVASIVE CV LAB;  Service: Cardiovascular;  Laterality: Bilateral;   TONSILLECTOMY     UMBILICAL HERNIA REPAIR     50 yr ago   VENTRAL HERNIA REPAIR N/A 10/24/2014   Procedure: LAPAROSCOPIC ASSISTED VENTRAL HERNIA REPAIR WITH MESH;  Surgeon: Deward Null III, MD;  Location: [facility] OR;  Service: General;  Laterality: N/A;   XI ROBOTIC ASSISTED INGUINAL HERNIA REPAIR WITH MESH Bilateral 11/17/2022   Procedure: XI ROBOTIC ASSISTED INGUINAL HERNIA REPAIR WITH MESH-Bilateral;  Surgeon: Desiderio Schanz, MD;  Location: [facility] ORS;  Service: General;  Laterality: Bilateral;    (Not in a hospital admission)  Social History   Socioeconomic History   Marital status: Married    Spouse name: THeresa   Number of children: Not on file   Years of education: Not on file   Highest education level: Not on file  Occupational History   Not on file  Tobacco Use   Smoking status: Every Day    Current packs/day: 1.00    Average packs/day: 1 pack/day for 40.0 years (40.0 ttl pk-yrs)    Types: Cigarettes    Passive exposure: Past   Smokeless tobacco: Never   Tobacco comments:    since age 46  Vaping Use   Vaping status: Never Used  Substance and Sexual Activity   Alcohol use: Yes    Alcohol/week: 3.0 standard drinks of alcohol    Types: 3 Glasses of wine per week    Drug use: No   Sexual activity: Not on file  Other Topics Concern   Not on file  Social History Narrative   Not on file   Social Drivers of Health   Financial Resource Strain: Low Risk  (07/19/2023)   [facility] from [facility]   Overall Financial Resource Strain (CARDIA)    Difficulty of Paying Living Expenses: Not hard at all  Food Insecurity: No Food Insecurity (04/16/2024)   Hunger Vital Sign    Worried About Running Out of Food in the Last Year: Never true    Ran Out of Food in the Last Year: Never true  Transportation Needs: No Transportation Needs (04/16/2024)   PRAPARE - Administrator, Civil Service (Medical): No    Lack of Transportation (Non-Medical): No  Physical Activity: Not on file  Stress: Not on file  Social Connections: Unknown (04/17/2024)   Social Connection and Isolation Panel    Frequency of Communication with Friends and Family: Once a week    Frequency of Social  Gatherings with Friends and Family: Twice a week    Attends Religious Services: Not on file    Active Member of Clubs or Organizations: No    Attends Banker Meetings: 1 to 4 times per year    Marital Status: Married  Catering manager Violence: Not At Risk (04/16/2024)   Humiliation, Afraid, Rape, and Kick questionnaire    Fear of Current or Ex-Partner: No    Emotionally Abused: No    Physically Abused: No    Sexually Abused: No    Family History  Problem Relation Age of Onset   CVA Father    Atrial fibrillation Sister      Vitals:   05/08/24 1154 05/08/24 1400 05/08/24 1527  BP: (!) 148/64 115/62 123/70  Pulse: 67 (!) 46 (!) 54  Resp:  16 19  Temp:   97.8 F (36.6 C)  TempSrc:   Oral  SpO2: 95% 96% 98%    PHYSICAL EXAM General: Well appearing elderly male, well nourished, in no acute distress. HEENT: Normocephalic and atraumatic. Neck: No JVD.   Lungs: Normal respiratory effort on room air. Clear bilaterally to auscultation. No wheezes,  crackles, rhonchi.  Heart: HRRR. Normal S1 and S2 without gallops or murmurs.  Abdomen: Non-distended appearing.  Msk: Normal strength and tone for age. Extremities: Warm and well perfused. No clubbing, cyanosis, edema.  Neuro: Alert and oriented X 3. Psych: Answers questions appropriately.   Labs: Basic Metabolic Panel: Recent Labs    05/08/24 1216  NA 139  K 3.6  CL 109  CO2 18*  GLUCOSE 135*  BUN 53*  CREATININE 3.89*  CALCIUM 9.0  MG 1.9   Liver Function Tests: Recent Labs    05/08/24 1216  AST 29  ALT 19  ALKPHOS 71  BILITOT 1.4*  PROT 6.8  ALBUMIN  3.1*   No results for input(s): LIPASE, AMYLASE in the last 72 hours. CBC: Recent Labs    05/08/24 1216  WBC 11.2*  NEUTROABS 7.9*  HGB 9.3*  HCT 29.2*  MCV 97.7  PLT 337   Cardiac Enzymes: Recent Labs    05/08/24 1216 05/08/24 1402  TROPONINIHS 11 10   BNP: No results for input(s): BNP in the last 72 hours. D-Dimer: No results for input(s): DDIMER in the last 72 hours. Hemoglobin A1C: No results for input(s): HGBA1C in the last 72 hours. Fasting Lipid Panel: No results for input(s): CHOL, HDL, LDLCALC, TRIG, CHOLHDL, LDLDIRECT in the last 72 hours. Thyroid Function Tests: Recent Labs    05/08/24 1216  TSH 3.426   Anemia Panel: No results for input(s): VITAMINB12, FOLATE, FERRITIN, TIBC, IRON, RETICCTPCT in the last 72 hours.   Radiology: PERIPHERAL VASCULAR CATHETERIZATION Result Date: 04/26/2024 See surgical note for result.  PERIPHERAL VASCULAR CATHETERIZATION Result Date: 04/19/2024 See surgical note for result.  DG Chest Port 1 View Result Date: 04/18/2024 CLINICAL DATA:  Respiratory failure. EXAM: PORTABLE CHEST 1 VIEW COMPARISON:  04/17/2024 FINDINGS: Stable asymmetric elevation right hemidiaphragm with right base atelectasis/infiltrate and probable small right pleural effusion. There is pulmonary vascular congestion without overt pulmonary edema.  Cardiopericardial silhouette is at upper limits of normal for size. No acute bony abnormality. Telemetry leads overlie the chest. IMPRESSION: 1. Stable asymmetric elevation right hemidiaphragm with right base atelectasis/infiltrate and probable small right pleural effusion. 2. Pulmonary vascular congestion without overt pulmonary edema. Electronically Signed   By: Camellia Candle M.D.   On: 04/18/2024 08:58   CT ABDOMEN PELVIS WO CONTRAST Result Date: 04/17/2024 CLINICAL  DATA:  Ileus or obstruction. EXAM: CT ABDOMEN AND PELVIS WITHOUT CONTRAST TECHNIQUE: Multidetector CT imaging of the abdomen and pelvis was performed following the standard protocol without IV contrast. RADIATION DOSE REDUCTION: This exam was performed according to the departmental dose-optimization program which includes automated exposure control, adjustment of the mA and/or kV according to patient size and/or use of iterative reconstruction technique. COMPARISON:  CT dated 04/15/2024. FINDINGS: Evaluation of this exam is limited in the absence of intravenous contrast. Lower chest: There is consolidation of the majority of the visualized right middle and right lower lobes. Debris noted in the right mainstem bronchus and right middle and right lower lobes. There is left lung base subpleural atelectasis. There is coronary vascular calcification. No intra-abdominal free air.  Small subhepatic fluid. Hepatobiliary: The liver is unremarkable. Vicarious excretion of contrast from prior study noted in the gallbladder. There is a stone in the cystic duct. No pericholecystic fluid or evidence of acute cholecystitis by CT. Pancreas: The pancreas is atrophic.  No active inflammatory changes. Spleen: Small scattered calcified splenic granuloma. Adrenals/Urinary Tract: The adrenal glands are unremarkable. Moderately atrophic kidneys. Small renal cysts and additional subcentimeter hypodense lesions which are too small to characterize. A 2 cm high attenuating  lesion from the superior pole of the right kidney is not characterized but may represent a complex/proteinaceous cyst. This is similar to prior CT. Further evaluation with ultrasound on a nonemergent/outpatient basis recommended. There is no hydronephrosis on either side. Residual contrast noted in the renal collecting systems bilaterally as well as in the urinary bladder. The visualized ureters and urinary bladder appear unremarkable. Stomach/Bowel: There is diffuse colonic diverticulosis. No active inflammation. There is diffuse dilatation of small-bowel loops measuring up to 3.8 cm. There is no discrete transition. A gradual transition noted in the right lower quadrant. Findings favor to represent an ileus and less likely a partial small bowel obstruction. Small-bowel series may provide better evaluation. The appendix is normal. Vascular/Lymphatic: An aorto bi iliac endovascular stent graft repair noted. The aorta is tortuous. There is a 3.7 cm aneurysmal dilatation of the infrarenal abdominal aorta. Bilateral common iliac aneurysms measure up to 4 cm on the left. The IVC is unremarkable. No portal venous gas. There is no adenopathy. Reproductive: The prostate is grossly unremarkable. Other: None Musculoskeletal: Osteopenia with degenerative changes of the spine and hips. No acute osseous pathology. IMPRESSION: 1. Diffuse dilatation of small-bowel loops without discrete transition. Findings favored to represent an ileus and less likely a partial small bowel obstruction. Small-bowel series may provide better evaluation. 2. Colonic diverticulosis. Normal appendix. 3. Cholelithiasis. 4. Aneurysmal dilatation of the abdominal aorta measure up to 3.7 cm. An aorto bi iliac endovascular stent graft repair is seen. Recommend follow-up ultrasound every 3 years. (Ref.: J Vasc Surg. 2018; 67:2-77 and J Am Coll Radiol 2013;10(10):789-794.) 5. Debris in the bronchus intermedius with probable postobstructive atelectasis or  pneumonia in the right middle and right lower lobes. Follow-up to resolution recommended. Electronically Signed   By: Vanetta Chou M.D.   On: 04/17/2024 18:37   DG Abd 1 View Result Date: 04/17/2024 CLINICAL DATA:  Abdominal pain. EXAM: ABDOMEN - 1 VIEW COMPARISON:  September 16, 2013. FINDINGS: Mildly dilated small bowel loops are noted without colonic dilatation. Mild amount of stool seen throughout the colon. Status post stent graft repair of abdominal aorta. IMPRESSION: Mild small bowel dilatation is noted concerning for distal small bowel obstruction or possibly ileus. Electronically Signed   By: Lynwood Landy Raddle  M.D.   On: 04/17/2024 14:52   DG Chest Port 1 View Result Date: 04/17/2024 CLINICAL DATA:  Respiratory failure with hypoxia. EXAM: PORTABLE CHEST 1 VIEW COMPARISON:  April 15, 2024. FINDINGS: Stable cardiomediastinal silhouette. Mild central pulmonary vascular congestion is noted. Elevated right hemidiaphragm is noted with minimal right basilar subsegmental atelectasis. Bony thorax is unremarkable. IMPRESSION: Mild central pulmonary vascular congestion is noted. Elevated right hemidiaphragm with minimal right basilar subsegmental atelectasis. Electronically Signed   By: Lynwood Landy Raddle M.D.   On: 04/17/2024 14:50   CT Angio Chest/Abd/Pel for Dissection W and/or Wo Contrast Result Date: 04/15/2024 CLINICAL DATA:  Coughing up blood EXAM: CT ANGIOGRAPHY CHEST, ABDOMEN AND PELVIS TECHNIQUE: Non-contrast CT of the chest was initially obtained. Multidetector CT imaging through the chest, abdomen and pelvis was performed using the standard protocol during bolus administration of intravenous contrast. Multiplanar reconstructed images and MIPs were obtained and reviewed to evaluate the vascular anatomy. RADIATION DOSE REDUCTION: This exam was performed according to the departmental dose-optimization program which includes automated exposure control, adjustment of the mA and/or kV according to patient  size and/or use of iterative reconstruction technique. CONTRAST:  80mL OMNIPAQUE  IOHEXOL  350 MG/ML SOLN COMPARISON:  CT 10/20/2022 FINDINGS: CTA CHEST FINDINGS Cardiovascular: Non contrasted images of the chest demonstrate no acute intramural hematoma. Negative for aortic dissection. Focal outpouching measuring 14 mm at the distal arch either representing prominent ductus diverticulum or aneurysm, but sludge least stable size compared with noncontrast CT from 2015. No extravasation identified within the chest. Coronary vascular calcification. Normal cardiac size. No pericardial effusion. Mediastinum/Nodes: Patent trachea. No thyroid mass. No suspicious lymph nodes. Esophagus within normal limits. Calcified nodes consistent with prior granulomatous disease. Lungs/Pleura: No pleural effusion or pneumothorax. Patchy mild peribronchovascular ground-glass disease. No consolidation. Musculoskeletal: Sternum appears intact. No acute osseous abnormality. Review of the MIP images confirms the above findings. CTA ABDOMEN AND PELVIS FINDINGS VASCULAR Aorta: Aortic atherosclerosis. Status post infrarenal abdominal aortoiliac stent graft. The proximal end of the stent is just below the level of the renal arteries. Aneurysmal dilatation of the abdominal aorta at the level of the renal arteries, slightly superior to the proximal end of the stent, this measures 4.6 x 3.7 cm on axial series 6, image 180, previously 4.2 by 3.2 cm. Saccular appearing component of the aneurysm at the level of the stent, coronal series 11, image 74, this appears increased in size compared to the non contrasted exam from January and there may be some soft tissue thickening along the saccular portion of the aneurysm on coronal series 11, image 73. No dissection is seen. 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 Possible small 5 mm aneurysm at the origin of the celiac artery, series 12, image 108. SMA: Mild-to-moderate  stenosis of the proximal SMA just distal to the origin, series 12, image 111. Distal vessel patency without dissection. Renals: Accessory upper pole right renal artery with severe stenosis at the origin, series 11, image 80. 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 String like flow at the proximal right renal artery, series 11 image 75, consistent with severe stenosis over a 12 mm length. Small accessory upper pole renal artery on the left with suspected severe stenosis at the origin. Dominant left renal artery with severe stenosis at the origin, series 6, image 172 and coronal series 11 image 74 and 75. IMA: Poorly visible, small vessel supplying the perirectal is traceable to  the iliac stent supplying the left iliac vessel. 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 Aneurysmal dilatation of the right common iliac artery at the distal end of the stent measuring up to 2.8 cm on series 11, image 73. Multiple small aneurysms of the proximal internal and iliac vessels. No high-grade stenosis or occlusive disease of the distal external iliac vessels. Veins: Suboptimally assessed Review of the MIP images confirms the above findings. NON-VASCULAR Hepatobiliary: Small gallstone. No biliary dilatation. No focal hepatic abnormality. Pancreas: Unremarkable. No pancreatic ductal dilatation or surrounding inflammatory changes. Spleen: Multiple calcified granuloma Adrenals/Urinary Tract: Adrenal glands are within normal limits. Kidneys show no hydronephrosis. Multiple renal cysts for which no imaging follow-up is recommended. Slightly thick-walled urinary bladder. Indeterminate slightly dense exophytic lesion off the upper pole right kidney measuring 17 mm, series 6, image 168. Stomach/Bowel: The stomach is non enlarged. There is no dilated small bowel. No acute bowel wall thickening.  Negative appendix. Diverticular disease of the colon. Lymphatic: No suspicious lymph nodes. Reproductive: Negative prostate Other: Negative for pelvic effusion or free air. Previously noted bilateral inguinal hernias are resolved/repair. Musculoskeletal: No acute or suspicious osseous abnormality. Review of the MIP images confirms the above findings. IMPRESSION: 1. Negative for acute aortic dissection or intramural hematoma. No extravasation identified within the chest, abdomen, or pelvis. 2. Status post infrarenal abdominal aortoiliac stent graft. Aneurysmal dilatation of the abdominal aorta at the level of the renal arteries, slightly superior to the proximal end of the stent, this measures 4.6 x 3.7 cm, previously 4.2 x 3.2 cm. Saccular appearing component of the aneurysm at the level of the proximal stent appears increased in size compared to the non contrasted exam from January and there may be some soft tissue thickening and stranding along the inferior aspect of the saccular part of the aneurysm raising concern for possible leakage or impending rupture, vascular surgery consultation is recommended. 3. 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 Aneurysmal dilatation of the right common iliac artery at the distal end of the stent measuring up to 2.8 cm. Multiple small aneurysms of the proximal internal and iliac vessels. 4. 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 There are small accessory upper pole renal arteries bilaterally with suspected severe stenosis at the origin of these vessels. 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 5. 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 Possible small 5 mm aneurysm at the origin of the celiac artery. 6. 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 Patchy mild peribronchovascular ground-glass  disease within the lungs, suggestive of respiratory infection. No consolidative pneumonia. 8. Cholelithiasis. Diverticular disease of the colon. 9. 16 mm indeterminate exophytic lesion off the upper pole right kidney, could initially correlate with ultrasound 10. Slightly thick-walled urinary bladder, correlate for cystitis. 11. Interval bilateral inguinal hernia repair. 12. Aortic atherosclerosis. Aortic Atherosclerosis (ICD10-I70.0). Electronically Signed   By: Luke Bun M.D.   On: 04/15/2024 19:47   DG Chest 2 View Result Date: 04/15/2024 CLINICAL DATA:  Hemoptysis since last night, worsening today. Increased lower extremity swelling for 1 week. EXAM: CHEST - 2 VIEW COMPARISON:  Radiographs 08/26/2013 and 08/24/2013. Chest CT 04/01/2014. FINDINGS: The lateral view was repeated. The heart size and mediastinal contours are normal. Chronic anterior eventration of the  right hemidiaphragm. The lungs appear clear. No pleural effusion or pneumothorax. Calcified splenic granulomas are noted. Possible old rib fractures bilaterally without acute osseous abnormality. IMPRESSION: No radiographic evidence of active cardiopulmonary process. If the patient has persistent unexplained hemoptysis, follow-up CT should be considered. Electronically Signed   By: Elsie Perone M.D.   On: 04/15/2024 15:17    ECHO ordered  TELEMETRY reviewed by me 05/08/2024: sinus rhythm, rate 50s  EKG reviewed by me: sinus bradycardia, rate 40 bpm  Data reviewed by me 05/08/2024: last 24h vitals tele labs imaging I/O ED provider note, admission H&P.  Principal Problem:   Bradycardia    ASSESSMENT AND PLAN:  Nathan Rivera is a 78 y.o. male  with a past medical history of hypertension, PAD, AAA rupture s/p repair 2 weeks ago (hospitalization 07/21 - 08/05), dementia, CKD who presented to the ED on 05/08/2024 for bradycardia, hypotension, and generalized weakness sent from nephrology outpatient due to SBP 80s and HR 30s. Patient  denies hx bradycardia. Denies lightheadedness, chest pain, SOB, palpitations or syncope. EKG in ED with sinus bradycardia, rate 40 bpm. Cardiology was consulted for further evaluation of possible symptomatic bradycardia.  # Sinus Bradycardia # Generalized weakness # Fatigue # Deconditioning  EKG with sinus bradycardia rate 40 bpm. Tele with sinus bradycardia as low as 38 bpm early on during admission. At time of eval, pt in SR, HR remaining in 50s.  -Echo ordered. -Continue to monitor telemetry closely. (No evidence of significant pauses or high grade AVB). -Avoid all AVN blockers. (Hold home metoprolol ) -Recommend adequate hydration  -Unclear if sxs of worsening weakness/fatigue since hospital discharge is related to deconditioning or symptomatic bradycardia. For now will continue to monitor telemetry and patients symptoms.  # AKI on CKD -Management per primary team.   This patient's plan of care was discussed and created with Dr. Ammon and he is in agreement.  Signed: Pax Reasoner, PA-C  05/08/2024, 4:15 PM [facility] Cardiology

## 2024-05-08 NOTE — H&P (Signed)
 History and Physical    [name]:[id number] DOB: 01/24/1946 DOA: 05/08/2024  DOS: the patient was seen and examined on 05/08/2024  PCP: Jeffie Cheryl BRAVO, MD   Patient coming from: Home  I have personally briefly reviewed patient's old medical records in [facility]  Chief Complaint: hypotension and bradycardia  HPI: Nathan Rivera is a pleasant 78 y.o. male with medical history significant for AAA rupture s/p repair 2 weeks ago, HTN, PAD, dementia, CKD presenting to the emergency room at [facility] from nephrologist office for evaluation of generalized weakness, hypotension and bradycardia.  Patient reports that he has been feeling weak for the last few days.  He was at nephrologist office for follow-up visit today when he was noted to be bradycardic with a heart rate in the 30s and hypotensive with SBP of 80.  He was sent over to ED for further evaluation by nephrologist Dr. Dennise.  Patient was taking metoprolol  and primary care recently decreased dose to 12.5 twice a day from 25 twice a day, patient was taking amlodipine  10, patient also taking Aricept .  Patient denies any chest pain, shortness of breath, fever, chills, nausea, vomiting.    Patient and his daughter and wife were at bedside.  Patient was not able to provide me any meaningful history but patient's wife provided most of the history.  ED Course: Upon arrival to the ED, patient is found to be hypotensive and bradycardic.  Patient also had AKI.  Patient recently was admitted to [facility] and was discharged on 04/30/2024 after being treated for saccular aneurysm of abdominal aortic aneurysm.  He underwent endoleak repair, renal artery stenting and discharged with improving creatinine.  Hospitalist service was consulted for evaluation for admission for hypotension bradycardia and generalized weakness.  Review of Systems:  ROS  All other systems negative except as noted in the HPI.  Past  Medical History:  Diagnosis Date   Alzheimer dementia [facility])    Aortic aneurysm (HCC)    Arthritis    knees are the biggest problem   Cancer [facility]) 2012   bladder   CKD (chronic kidney disease), stage III (HCC) 2014   relative to infection, septic- renal calculi   Degenerative disc disease    Degenerative joint disease    GERD (gastroesophageal reflux disease)    History of kidney stones    Hypercholesterolemia    Hypertension    no meds currently   Impaired glucose tolerance    Muscle fatigue    muscle fatigue relative to sepsis experience    Peripheral vascular disease (HCC)    Pneumonia    Raynaud's disease    Sepsis (HCC) 07/2013   Stroke (HCC)    Thromboembolism of left popilteal vein 2015   pt. unsure which leg, treating with Xarelto    UTI (urinary tract infection) 08/17/2013   Lt nephrostomy tube and stent placement    Past Surgical History:  Procedure Laterality Date   ABDOMINAL AORTIC ANEURYSM REPAIR  2011   CATARACT EXTRACTION W/PHACO Left 02/15/2021   Procedure: CATARACT EXTRACTION PHACO AND INTRAOCULAR LENS PLACEMENT (IOC) LEFT 5.59 00:44.0;  Surgeon: Myrna Adine Anes, MD;  Location: [facility];  Service: Ophthalmology;  Laterality: Left;   CATARACT EXTRACTION W/PHACO Right 03/08/2021   Procedure: CATARACT EXTRACTION PHACO AND INTRAOCULAR LENS PLACEMENT (IOC) RIGHT 1.81 00:22.7;  Surgeon: Myrna Adine Anes, MD;  Location: [facility];  Service: Ophthalmology;  Laterality: Right;   ENDOVASCULAR REPAIR/STENT GRAFT N/A 04/26/2024  Procedure: ENDOVASCULAR REPAIR/STENT GRAFT;  Surgeon: Jama Cordella MATSU, MD;  Location: [facility] INVASIVE CV LAB;  Service: Cardiovascular;  Laterality: N/A;   ESOPHAGOGASTRODUODENOSCOPY N/A 04/16/2024   Procedure: EGD (ESOPHAGOGASTRODUODENOSCOPY);  Surgeon: Jinny Carmine, MD;  Location: [facility] ENDOSCOPY;  Service: Endoscopy;  Laterality: N/A;   HERNIA REPAIR     26mo old   INSERTION OF MESH N/A 10/24/2014   Procedure: INSERTION  OF MESH;  Surgeon: Deward Null III, MD;  Location: [facility] OR;  Service: General;  Laterality: N/A;   LAPAROTOMY N/A 08/24/2013   Procedure: EXPLORATORY LAPAROTOMY;  Surgeon: Deward GORMAN Null DOUGLAS, MD;  Location: [facility] OR;  Service: General;  Laterality: N/A;  Gram patch closure   litrotripsy  09/24/2013   NEPHROSTOMY  Nov. 2014   nephrostomy removal  Dec. 2014   PERIPHERALLY INSERTED CENTRAL CATHETER INSERTION     RENAL ANGIOGRAPHY Bilateral 04/19/2024   Procedure: RENAL ANGIOGRAPHY;  Surgeon: Marea Selinda GORMAN, MD;  Location: [facility] INVASIVE CV LAB;  Service: Cardiovascular;  Laterality: Bilateral;   TONSILLECTOMY     UMBILICAL HERNIA REPAIR     50 yr ago   VENTRAL HERNIA REPAIR N/A 10/24/2014   Procedure: LAPAROSCOPIC ASSISTED VENTRAL HERNIA REPAIR WITH MESH;  Surgeon: Deward Null III, MD;  Location: [facility] OR;  Service: General;  Laterality: N/A;   XI ROBOTIC ASSISTED INGUINAL HERNIA REPAIR WITH MESH Bilateral 11/17/2022   Procedure: XI ROBOTIC ASSISTED INGUINAL HERNIA REPAIR WITH MESH-Bilateral;  Surgeon: Desiderio Schanz, MD;  Location: [facility] ORS;  Service: General;  Laterality: Bilateral;     reports that he has been smoking cigarettes. He has a 40 pack-year smoking history. He has been exposed to tobacco smoke. He has never used smokeless tobacco. He reports current alcohol use of about 3.0 standard drinks of alcohol per week. He reports that he does not use drugs.  Allergies  Allergen Reactions   Duloxetine Dermatitis    Family History  Problem Relation Age of Onset   CVA Father    Atrial fibrillation Sister     Prior to Admission medications   Medication Sig Start Date End Date Taking? Authorizing Provider  acetaminophen  (TYLENOL ) 325 MG tablet Take 325 mg by mouth every 6 (six) hours as needed for mild pain.    [provider]  amLODipine  (NORVASC ) 10 MG tablet Take 1 tablet (10 mg total) by mouth daily. 04/30/24 07/29/24  Von Bellis, MD  aspirin  EC 81 MG tablet Take 1 tablet (81 mg total) by  mouth daily. Swallow whole. 05/01/24   Von Bellis, MD  bisacodyl  (DULCOLAX) 10 MG suppository Place 1 suppository (10 mg total) rectally daily as needed for severe constipation. 04/30/24   Von Bellis, MD  bisacodyl  (DULCOLAX) 5 MG EC tablet Take 2 tablets (10 mg total) by mouth at bedtime. 04/30/24   Von Bellis, MD  cholecalciferol (VITAMIN D) 1000 UNITS tablet Take 1,000 Units by mouth daily.    [provider]  donepezil  (ARICEPT ) 10 MG tablet Take 10 mg by mouth at bedtime.    [provider]  FOLIC ACID PO Take by mouth.    [provider]  gabapentin  (NEURONTIN ) 100 MG capsule 200 mg 2 (two) times daily. 200 mg in am and 200 mg in PM 04/01/17   [provider]  hydrALAZINE  (APRESOLINE ) 100 MG tablet Take 1 tablet (100 mg total) by mouth every 6 (six) hours. 04/30/24 07/29/24  Von Bellis, MD  memantine (NAMENDA) 10 MG tablet TAKE 1 TABLET BY MOUTH TWICE DAILY FOR MEMORY  LOSS 02/28/24   [provider]  metoprolol  tartrate (LOPRESSOR ) 25 MG tablet Take 1 tablet (25 mg total) by mouth 2 (two) times daily. 04/30/24 07/29/24  Von Bellis, MD  pantoprazole  (PROTONIX ) 40 MG tablet Take 40 mg by mouth at bedtime.  11/13/13   [provider]  polyethylene glycol powder (GLYCOLAX /MIRALAX ) 17 GM/SCOOP powder Take 17 g by mouth 2 (two) times daily. 04/30/24   Von Bellis, MD  simvastatin  (ZOCOR ) 20 MG tablet Take 20 mg by mouth at bedtime.    [provider]  vitamin B-12 (CYANOCOBALAMIN ) 500 MCG tablet Take 500 mcg by mouth daily.    [provider]  XARELTO  20 MG TABS tablet daily.  12/23/13   [provider]    Physical Exam: Vitals:   05/08/24 1154 05/08/24 1400 05/08/24 1527  BP: (!) 148/64 115/62 123/70  Pulse: 67 (!) 46 (!) 54  Resp:  16 19  Temp:   97.8 F (36.6 C)  TempSrc:   Oral  SpO2: 95% 96% 98%    Physical Exam   Constitutional: Alert, awake, calm, comfortable HEENT: Neck supple Respiratory: Clear to  auscultation B/L, no wheezing, no rales.  Cardiovascular: Regular rate and rhythm, no murmurs / rubs / gallops. No extremity edema. 2+ pedal pulses. No carotid bruits.  Abdomen: Soft, no tenderness, Bowel sounds positive.  Musculoskeletal: no clubbing / cyanosis. Good ROM, no contractures. Normal muscle tone.  Skin: no rashes, lesions, ulcers. Neurologic: CN 2-12 grossly intact. Sensation intact, No focal deficit identified Psychiatric: Alert and oriented x 3. Normal mood.    Labs on Admission: I have personally reviewed following labs and imaging studies  CBC: Recent Labs  Lab 05/08/24 1216  WBC 11.2*  NEUTROABS 7.9*  HGB 9.3*  HCT 29.2*  MCV 97.7  PLT 337   Basic Metabolic Panel: Recent Labs  Lab 05/08/24 1216  NA 139  K 3.6  CL 109  CO2 18*  GLUCOSE 135*  BUN 53*  CREATININE 3.89*  CALCIUM 9.0  MG 1.9   GFR: Estimated Creatinine Clearance: 17.6 mL/min (A) (by C-G formula based on SCr of 3.89 mg/dL (H)). Liver Function Tests: Recent Labs  Lab 05/08/24 1216  AST 29  ALT 19  ALKPHOS 71  BILITOT 1.4*  PROT 6.8  ALBUMIN  3.1*   No results for input(s): LIPASE, AMYLASE in the last 168 hours. No results for input(s): AMMONIA in the last 168 hours. Coagulation Profile: No results for input(s): INR, PROTIME in the last 168 hours. Cardiac Enzymes: Recent Labs  Lab 05/08/24 1216 05/08/24 1402  TROPONINIHS 11 10   BNP (last 3 results) Recent Labs    04/15/24 1447  BNP 394.5*   HbA1C: No results for input(s): HGBA1C in the last 72 hours. CBG: No results for input(s): GLUCAP in the last 168 hours. Lipid Profile: No results for input(s): CHOL, HDL, LDLCALC, TRIG, CHOLHDL, LDLDIRECT in the last 72 hours. Thyroid Function Tests: Recent Labs    05/08/24 1216  TSH 3.426  FREET4 1.00   Anemia Panel: No results for input(s): VITAMINB12, FOLATE, FERRITIN, TIBC, IRON, RETICCTPCT in the last 72 hours. Urine analysis:     Component Value Date/Time   COLORURINE YELLOW (A) 10/20/2022 1922   APPEARANCEUR HAZY (A) 10/20/2022 1922   APPEARANCEUR Cloudy 08/17/2013 1922   LABSPEC 1.018 10/20/2022 1922   LABSPEC 1.013 08/17/2013 1922   PHURINE 6.0 10/20/2022 1922   GLUCOSEU NEGATIVE 10/20/2022 1922   GLUCOSEU Negative 08/17/2013 1922   HGBUR NEGATIVE 10/20/2022  1922   BILIRUBINUR NEGATIVE 10/20/2022 1922   BILIRUBINUR Negative 08/17/2013 1922   KETONESUR NEGATIVE 10/20/2022 1922   PROTEINUR >=300 (A) 10/20/2022 1922   UROBILINOGEN 1.0 08/18/2013 1713   NITRITE NEGATIVE 10/20/2022 1922   LEUKOCYTESUR MODERATE (A) 10/20/2022 1922   LEUKOCYTESUR 1+ 08/17/2013 1922    Radiological Exams on Admission: I have personally reviewed images No results found.  EKG: My personal interpretation of EKG shows: Bradycardia with a heart rate of 40 with some junctional component.    Assessment/Plan Principal Problem:   Bradycardia Active Problems:   PAD (peripheral artery disease) (HCC)   Hyperlipidemia   Hypotension, unspecified   Abdominal aneurysm (HCC)    Assessment and Plan:  78 year old male with medical problem including but not limited to recent repair of abdominal aortic aneurysm endoleak and stenting of the renal artery with improving creatinine function, HTN, Hx HLD, PAD who presented from nephrologist office for hypotension bradycardia and generalized weakness.  1.  Generalized weakness, bradycardia, hypotension - May be related to metoprolol  and Aricept  patient is taking. - Will hold those medications - He will be admitted to hospital as inpatient - Patient received some IV fluid in the emergency room. - Monitor him in telemetry  2.  Bradycardia/hypotension - May be related to metoprolol  and Aricept  - Will rule out cardiac event - Will get cardiology evaluation - 2 sets of troponins have been 11 and 10  3.  AKI on CKD - Patient was evaluated at outpatient clinic by Dr. Dennise - Will call  nephrology for evaluation - Will defer to nephrologist for if we need to give him IV fluid  4.  Recent history of aortic aneurysm repair for endoleak and stent placement - Does not have any symptoms - Will continue to monitor  5.  HTN/HLD - Blood pressure is low - Will continue to monitor     DVT prophylaxis: Xarelto  Code Status: Full Code Family Communication: wife and daughter at beside  Disposition Plan: Home  Consults called: Cardiology and Nephrology  Admission status: Inpatient, Telemetry bed   Nena Rebel, MD [facility] 05/08/2024, 4:40 PM

## 2024-05-08 NOTE — ED Provider Notes (Signed)
 [facility] Provider Note    Event Date/Time   First MD Initiated Contact with Patient 05/08/24 1205     (approximate)   History   Hypotension   HPI  Nathan Rivera is a 78 year old male with history of AAA rupture s/p repair, HTN, PAD, CKD presenting to the emergency department for evaluation of weakness.  Patient reports he has been feeling generally weak.  He was in his nephrology office for follow-up visit today when he was noted to be bradycardic with heart rates in the 30s and hypotensive with SBP of 80.  He was directed to the ER for further evaluation.  Patient reports he has been taking his medications as directed including metoprolol  which was recently decreased to a half dose 2 days ago, and amlodipine  which was increased to 10 mg during recent admission.  No chest pain, shortness of breath.   Reviewed discharge summary from 04/30/2024.  At that time, patient presented with saccular aneurysm along the proximal stent of his abdominal aorta, admitted for blood pressure control.  Underwent endoleak repair.  Underwent renal artery stenosis stenting.  Discharged with improving creatinine.     Physical Exam   Triage Vital Signs: ED Triage Vitals  Encounter Vitals Group     BP 05/08/24 1154 (!) 148/64     Girls Systolic BP Percentile --      Girls Diastolic BP Percentile --      Boys Systolic BP Percentile --      Boys Diastolic BP Percentile --      Pulse Rate 05/08/24 1154 67     Resp 05/08/24 1400 16     Temp --      Temp src --      SpO2 05/08/24 1154 95 %     Weight --      Height --      Head Circumference --      Peak Flow --      Pain Score --      Pain Loc --      Pain Education --      Exclude from Growth Chart --     Most recent vital signs: Vitals:   05/08/24 1154 05/08/24 1400  BP: (!) 148/64 115/62  Pulse: 67 (!) 46  Resp:  16  SpO2: 95% 96%     General: Awake, interactive  CV:  Bradycardic with regular rhythm, good  peripheral perfusion Resp:  Unlabored respirations, lungs clear to auscultation Abd:  Nondistended, soft Neuro:  Symmetric facial movement, fluid speech   ED Results / Procedures / Treatments   Labs (all labs ordered are listed, but only abnormal results are displayed) Labs Reviewed  COMPREHENSIVE METABOLIC PANEL WITH GFR - Abnormal; Notable for the following components:      Result Value   CO2 18 (*)    Glucose, Bld 135 (*)    BUN 53 (*)    Creatinine, Ser 3.89 (*)    Albumin  3.1 (*)    Total Bilirubin 1.4 (*)    GFR, Estimated 15 (*)    All other components within normal limits  CBC WITH DIFFERENTIAL/PLATELET - Abnormal; Notable for the following components:   WBC 11.2 (*)    RBC 2.99 (*)    Hemoglobin 9.3 (*)    HCT 29.2 (*)    RDW 17.1 (*)    Neutro Abs 7.9 (*)    Eosinophils Absolute 0.6 (*)    Abs Immature Granulocytes 0.22 (*)  All other components within normal limits  MAGNESIUM   TSH  T4, FREE  CBC WITH DIFFERENTIAL/PLATELET  TROPONIN I (HIGH SENSITIVITY)  TROPONIN I (HIGH SENSITIVITY)     EKG EKG independently reviewed and interpreted by myself demonstrates:    RADIOLOGY Imaging independently reviewed and interpreted by myself demonstrates:   Formal Radiology Read:  No results found.  PROCEDURES:  Critical Care performed: No  Procedures   MEDICATIONS ORDERED IN ED: Medications  magnesium  oxide (MAG-OX) tablet 400 mg (has no administration in time range)  potassium chloride  SA (KLOR-CON  M) CR tablet 40 mEq (has no administration in time range)  sodium chloride  0.9 % bolus 500 mL (500 mLs Intravenous New Bag/Given 05/08/24 1405)     IMPRESSION / MDM / ASSESSMENT AND PLAN / ED COURSE  I reviewed the triage vital signs and the nursing notes.  Differential diagnosis includes, but is not limited to, bradycardia mediated secondary to beta-blocker or calcium channel blocker use, electrolyte abnormality, arrhythmia, anemia, dehydration  Patient's  presentation is most consistent with acute presentation with potential threat to life or bodily function.  78 year old male presenting to the emergency department for evaluation of weakness.  Significant bradycardia with associated hypotension in nephrology clinic, remains bradycardic with heart rates from the 30s to 40s here, but with stable blood pressure.  Normal mental status, do not think there is an indication for pacing currently.  Labs were obtained with mild leukocytosis, stable anemia.  CMP unfortunately does demonstrate worsening renal function with creatinine of 3.89, discharged at 2.34.  Mag 1.9.  K3.6.  Repletion ordered to optimize to 2 and 4.  EKG demonstrates junctional bradycardia without appreciable heart block.  Possibly related to his BB/CCB use.  With his worsening renal function and symptomatic bradycardia, do think he is appropriate for admission.  Will reach out to hospitalist team.  [phone] Case discussed with hospitalist team.  They will evaluate for anticipated admission.      FINAL CLINICAL IMPRESSION(S) / ED DIAGNOSES   Final diagnoses:  Symptomatic bradycardia  Acute renal failure superimposed on chronic kidney disease, unspecified acute renal failure type, unspecified CKD stage (HCC)     Rx / DC Orders   ED Discharge Orders     None        Note:  This document was prepared using Dragon voice recognition software and may include unintentional dictation errors.   Levander Slate, MD 05/08/24 [phone]

## 2024-05-09 ENCOUNTER — Encounter: Payer: Self-pay | Admitting: Hospitalist

## 2024-05-09 ENCOUNTER — Other Ambulatory Visit: Payer: Self-pay

## 2024-05-09 DIAGNOSIS — R001 Bradycardia, unspecified: Secondary | ICD-10-CM | POA: Diagnosis not present

## 2024-05-09 LAB — COMPREHENSIVE METABOLIC PANEL WITH GFR
ALT: 25 U/L (ref 0–44)
AST: 48 U/L — ABNORMAL HIGH (ref 15–41)
Albumin: 2.9 g/dL — ABNORMAL LOW (ref 3.5–5.0)
Alkaline Phosphatase: 126 U/L (ref 38–126)
Anion gap: 7 (ref 5–15)
BUN: 51 mg/dL — ABNORMAL HIGH (ref 8–23)
CO2: 19 mmol/L — ABNORMAL LOW (ref 22–32)
Calcium: 8.6 mg/dL — ABNORMAL LOW (ref 8.9–10.3)
Chloride: 113 mmol/L — ABNORMAL HIGH (ref 98–111)
Creatinine, Ser: 3.51 mg/dL — ABNORMAL HIGH (ref 0.61–1.24)
GFR, Estimated: 17 mL/min — ABNORMAL LOW (ref >60)
Glucose, Bld: 90 mg/dL (ref 70–99)
Potassium: 3.8 mmol/L (ref 3.5–5.1)
Sodium: 139 mmol/L (ref 135–145)
Total Bilirubin: 1.3 mg/dL — ABNORMAL HIGH (ref 0.0–1.2)
Total Protein: 6.3 g/dL — ABNORMAL LOW (ref 6.5–8.1)

## 2024-05-09 LAB — CBC
HCT: 26.4 % — ABNORMAL LOW (ref 39.0–52.0)
Hemoglobin: 8.4 g/dL — ABNORMAL LOW (ref 13.0–17.0)
MCH: 30.7 pg (ref 26.0–34.0)
MCHC: 31.8 g/dL (ref 30.0–36.0)
MCV: 96.4 fL (ref 80.0–100.0)
Platelets: 290 K/uL (ref 150–400)
RBC: 2.74 MIL/uL — ABNORMAL LOW (ref 4.22–5.81)
RDW: 17.2 % — ABNORMAL HIGH (ref 11.5–15.5)
WBC: 8.7 K/uL (ref 4.0–10.5)
nRBC: 0 % (ref 0.0–0.2)

## 2024-05-09 LAB — PROTIME-INR
INR: 1.7 — ABNORMAL HIGH (ref 0.8–1.2)
Prothrombin Time: 20.9 s — ABNORMAL HIGH (ref 11.4–15.2)

## 2024-05-09 MED ORDER — AMLODIPINE BESYLATE 10 MG PO TABS
10.0000 mg | ORAL_TABLET | Freq: Every day | ORAL | Status: DC
  Administered 2024-05-09 – 2024-05-10 (×2): 10 mg via ORAL
  Filled 2024-05-09: qty 2
  Filled 2024-05-09: qty 1

## 2024-05-09 MED ORDER — SODIUM CHLORIDE 0.9 % IV SOLN: INTRAVENOUS | Status: AC

## 2024-05-09 NOTE — Progress Notes (Signed)
 Approximately 1830--Pt arrived to room 237 from ED. Upon arrival, VS obtained and assessment completed by this RN. Telemetry connected and notified.

## 2024-05-09 NOTE — Progress Notes (Signed)
 [facility]  ROUNDING NOTE   Subjective:   Nathan Rivera with past medical history of peripheral arterial disease, hypertension, abdominal aortic aneurysm, hyperlipidemia, bladder cancer, thrombocytopenia who was admitted for Bradycardia [R00.1] .  Status post iliac artery repair with coil embolization by Dr. Jama 04/26/24, during previous admission.  Patient arrived to outpatient nephrology office for scheduled appointment and was found to be severely hypotensive, somnolent, and decreased oxygen saturations.  Patient advised to go to emergency department to be evaluated.  Patient is seen sitting up on stretcher, eating breakfast.  Wife and daughter at bedside.  Wife states patient continued to have fatigue after discharge.  Earlier that day, family states he awoke with low blood pressures including heart rate low 40s.  Labs on ED arrival concerning for serum bicarb 18, BUN 53, creatinine 3.89 with GFR 15, white count 11.2, and hemoglobin 9.3.    We have been consulted to evaluate acute kidney injury.  Objective:  Vital signs in last 24 hours:  Temp:  [97.7 F (36.5 C)-97.9 F (36.6 C)] 97.8 F (36.6 C) (08/14 0723) Pulse Rate:  [42-60] 57 (08/14 1200) Resp:  [12-22] 13 (08/14 1200) BP: (114-156)/(59-100) 156/76 (08/14 1200) SpO2:  [93 %-100 %] 97 % (08/14 1200)  Weight change:  There were no vitals filed for this visit.   Intake/Output: I/O last 3 completed shifts: In: 500 [IV Piggyback:500] Out: -    Intake/Output this shift:  No intake/output data recorded.  Physical Exam: General: No acute distress  Head: Normocephalic, atraumatic. Moist oral mucosal membranes  Neck: Supple  Lungs:  Clear, rare  Heart: Regular   Abdomen:  Soft, nontender, bowel sounds present  Extremities: no peripheral edema.  Neurologic: Awake, alert, following commands  Skin: No acute rash  Access: No hemodialysis access    Basic Metabolic Panel: Recent Labs  Lab  05/08/24 1216 05/09/24 0350  NA 139 139  K 3.6 3.8  CL 109 113*  CO2 18* 19*  GLUCOSE 135* 90  BUN 53* 51*  CREATININE 3.89* 3.51*  CALCIUM 9.0 8.6*  MG 1.9  --     Liver Function Tests: Recent Labs  Lab 05/08/24 1216 05/09/24 0350  AST 29 48*  ALT 19 25  ALKPHOS 71 126  BILITOT 1.4* 1.3*  PROT 6.8 6.3*  ALBUMIN  3.1* 2.9*   No results for input(s): LIPASE, AMYLASE in the last 168 hours. No results for input(s): AMMONIA in the last 168 hours.  CBC: Recent Labs  Lab 05/08/24 1216 05/09/24 0350  WBC 11.2* 8.7  NEUTROABS 7.9*  --   HGB 9.3* 8.4*  HCT 29.2* 26.4*  MCV 97.7 96.4  PLT 337 290    Cardiac Enzymes: No results for input(s): CKTOTAL, CKMB, CKMBINDEX, TROPONINI in the last 168 hours.  BNP: Invalid input(s): POCBNP  CBG: No results for input(s): GLUCAP in the last 168 hours.   Microbiology: Results for orders placed or performed during the hospital encounter of 04/15/24  Blood culture (routine x 2)     Status: None   Collection Time: 04/15/24  8:55 PM   Specimen: BLOOD  Result Value Ref Range Status   Specimen Description BLOOD BLOOD RIGHT ARM  Final   Special Requests   Final    BOTTLES DRAWN AEROBIC AND ANAEROBIC Blood Culture adequate volume   Culture   Final    NO GROWTH 5 DAYS Performed at [facility], [address], [place], [place] [address]    Report Status 04/20/2024 FINAL  Final  Blood culture (routine x 2)     Status: None   Collection Time: 04/15/24  8:55 PM   Specimen: BLOOD  Result Value Ref Range Status   Specimen Description BLOOD BLOOD LEFT ARM  Final   Special Requests   Final    BOTTLES DRAWN AEROBIC AND ANAEROBIC Blood Culture results may not be optimal due to an inadequate volume of blood received in culture bottles   Culture   Final    NO GROWTH 5 DAYS Performed at [facility], [address], [place], [place] [address]    Report Status 04/20/2024 FINAL  Final  MRSA Next Gen  by PCR, Nasal     Status: None   Collection Time: 04/15/24 10:01 PM   Specimen: Nasal Mucosa; Nasal Swab  Result Value Ref Range Status   MRSA by PCR Next Gen NOT DETECTED NOT DETECTED Final    Comment: (NOTE) The GeneXpert MRSA Assay (FDA approved for NASAL specimens only), is one component of a comprehensive MRSA colonization surveillance program. It is not intended to diagnose MRSA infection nor to guide or monitor treatment for MRSA infections. Test performance is not FDA approved in patients less than 63 years old. Performed at [facility], [address] [place], [place] [address]   Culture, Respiratory w Gram Stain     Status: None   Collection Time: 04/16/24 11:42 AM   Specimen: SPU; Respiratory  Result Value Ref Range Status   Specimen Description   Final    SPUTUM Performed at [facility], [address], [place], [place] [address]    Special Requests   Final     EXPSU Performed at [facility], [address] [place], [place] [address]    Gram Stain   Final    ABUNDANT SQUAMOUS EPITHELIAL CELLS PRESENT MODERATE GRAM POSITIVE COCCI MODERATE GRAM NEGATIVE RODS    Culture   Final    Normal respiratory flora-no Staph aureus or Pseudomonas seen Performed at [facility], [address] [place], [place] [address]    Report Status 04/18/2024 FINAL  Final  Respiratory (~20 pathogens) panel by PCR     Status: None   Collection Time: 04/16/24 11:42 AM   Specimen: Expectorated Sputum; Respiratory  Result Value Ref Range Status   Adenovirus NOT DETECTED NOT DETECTED Final   Coronavirus 229E NOT DETECTED NOT DETECTED Final    Comment: (NOTE) The Coronavirus on the Respiratory Panel, DOES NOT test for the novel  Coronavirus (2019 nCoV)    Coronavirus HKU1 NOT DETECTED NOT DETECTED Final   Coronavirus NL63 NOT DETECTED NOT DETECTED Final   Coronavirus OC43 NOT DETECTED NOT DETECTED Final   Metapneumovirus NOT DETECTED NOT DETECTED  Final   Rhinovirus / Enterovirus NOT DETECTED NOT DETECTED Final   Influenza A NOT DETECTED NOT DETECTED Final   Influenza B NOT DETECTED NOT DETECTED Final   Parainfluenza Virus 1 NOT DETECTED NOT DETECTED Final   Parainfluenza Virus 2 NOT DETECTED NOT DETECTED Final   Parainfluenza Virus 3 NOT DETECTED NOT DETECTED Final   Parainfluenza Virus 4 NOT DETECTED NOT DETECTED Final   Respiratory Syncytial Virus NOT DETECTED NOT DETECTED Final   Bordetella pertussis NOT DETECTED NOT DETECTED Final   Bordetella Parapertussis NOT DETECTED NOT DETECTED Final   Chlamydophila pneumoniae NOT DETECTED NOT DETECTED Final   Mycoplasma pneumoniae NOT DETECTED NOT DETECTED Final    Comment: Performed at [facility], [address], [place],  [place] [address]    Coagulation Studies: Recent Labs    05/09/24 0350  LABPROT 20.9*  INR 1.7*      Urinalysis: No results for input(s): COLORURINE, LABSPEC, PHURINE, GLUCOSEU, HGBUR, BILIRUBINUR, KETONESUR, PROTEINUR, UROBILINOGEN, NITRITE, LEUKOCYTESUR in the last 72 hours.  Invalid input(s): APPERANCEUR    Imaging: No results found.     Medications:    sodium chloride  40 mL/hr at 05/09/24 0858      aspirin  EC  81 mg Oral Daily   cyanocobalamin   500 mcg Oral Daily   pantoprazole   40 mg Oral QHS   rivaroxaban   20 mg Oral Q supper   simvastatin   20 mg Oral QHS   acetaminophen  **OR** acetaminophen , ondansetron  **OR** ondansetron  (ZOFRAN ) IV, polyethylene glycol  Assessment/ Plan:  78 y.o. male with past medical history of peripheral arterial disease, hypertension, abdominal aortic aneurysm, hyperlipidemia, bladder cancer, thrombocytopenia who was admitted for Bradycardia [R00.1] With  1.  Acute kidney injury/chronic kidney disease stage IIIb with proteinuria -baseline creatinine 2.34 with EGFR 28 on 04/30/2024.  Acute kidney injury likely secondary to hypotension.  All antihypertensives currently AM.  Patiently  recently underwent renal stent placement during previous admission.  Expect kidney function to improve with increased blood pressures.  Will offer gentle hydration, normal saline at 40 mL/h.  Will continue to monitor.  No acute indication for dialysis.  Patient has been in our office at discharge.  08/13 0701 - 08/14 0700 In: 500 [IV Piggyback:500] Out: -  Lab Results  Component Value Date   CREATININE 3.51 (H) 05/09/2024   CREATININE 3.89 (H) 05/08/2024   CREATININE 2.34 (H) 04/30/2024    2.  Hypotension with history of hypertension.  Recent renal artery stent placement, likely resulting in 2 decreased blood pressures due to medications.  Medications currently held.  Blood pressure stable at this time. Today.  3.  Acute metabolic acidosis.  Serum bicarb 18 on admission.  This should correct with renal function.  Monitor for now.  4. Anemia of chronic kidney disease Lab Results  Component Value Date   HGB 8.4 (L) 05/09/2024    Hemoglobin decreased, 8.4.  Will continue to monitor assess need for ESA.   LOS: 1 Akeiba Axelson 8/14/202512:54 PM

## 2024-05-09 NOTE — Evaluation (Signed)
 Occupational Therapy Evaluation Patient Details Name: Nathan Rivera MRN: [id number] DOB: [birth year] Today's Date: 05/09/2024   History of Present Illness   78 y/o male presented to ED on 05/08/24 for low creatine, bradycardia, and hypotensive. Recent admission 7/21-8/5 after AAA rupture s/p repair. PMH: dementia, HTN, PAD, AAA rupture s/p repair 2025, CKD     Clinical Impressions Pt was seen for OT evaluation this date. Pt alert and oriented to self, tends to talk around confrontational questioning. Denies complaints. Pt grossly MIN A for mobility and ADL. Requires cues to initiate, sequence tasks. Pt would benefit from skilled OT services to address noted impairments and functional limitations (see below for any additional details) in order to maximize safety and independence while minimizing falls risk and caregiver burden. Anticipate the need for follow up OT services upon acute hospital DC.    If plan is discharge home, recommend the following:   A little help with walking and/or transfers;A little help with bathing/dressing/bathroom;Direct supervision/assist for medications management;Direct supervision/assist for financial management;Assist for transportation;Supervision due to cognitive status;Assistance with cooking/housework     Functional Status Assessment   Patient has had a recent decline in their functional status and demonstrates the ability to make significant improvements in function in a reasonable and predictable amount of time.     Equipment Recommendations   BSC/3in1;Hospital bed     Recommendations for Other Services         Precautions/Restrictions   Precautions Precautions: Fall Recall of Precautions/Restrictions: Impaired Restrictions Weight Bearing Restrictions Per Provider Order: No     Mobility Bed Mobility Overal bed mobility: Needs Assistance Bed Mobility: Supine to Sit, Sit to Supine     Supine to sit: Contact guard, Min assist Sit  to supine: Contact guard assist   General bed mobility comments: VC for initiation and sequencing    Transfers Overall transfer level: Needs assistance Equipment used: Rolling walker (2 wheels) Transfers: Sit to/from Stand Sit to Stand: Contact guard assist                  Balance Overall balance assessment: Needs assistance Sitting-balance support: Feet supported Sitting balance-Leahy Scale: Fair     Standing balance support: Bilateral upper extremity supported, During functional activity Standing balance-Leahy Scale: Fair                             ADL either performed or assessed with clinical judgement   ADL Overall ADL's : Needs assistance/impaired                 Upper Body Dressing : Minimal assistance   Lower Body Dressing: Minimal assistance;Sitting/lateral leans               Functional mobility during ADLs: Contact guard assist;Minimal assistance;Cueing for safety;Cueing for sequencing;Rolling walker (2 wheels)       Vision         Perception         Praxis         Pertinent Vitals/Pain Pain Assessment Pain Assessment: No/denies pain     Extremity/Trunk Assessment Upper Extremity Assessment Upper Extremity Assessment: Generalized weakness   Lower Extremity Assessment Lower Extremity Assessment: Generalized weakness   Cervical / Trunk Assessment Cervical / Trunk Assessment: Normal   Communication Communication Communication: No apparent difficulties   Cognition Arousal: Alert Behavior During Therapy: WFL for tasks assessed/performed Cognition: History of cognitive impairments  Following commands: Impaired Following commands impaired: Follows one step commands inconsistently, Follows one step commands with increased time     Cueing  General Comments   Cueing Techniques: Verbal cues;Tactile cues  VSS on RA   Exercises     Shoulder Instructions       Home Living Family/patient expects to be discharged to:: Private residence Living Arrangements: Spouse/significant other Available Help at Discharge: Family;Available 24 hours/day Type of Home: House Home Access: Stairs to enter [employer] of Steps: 3 Entrance Stairs-Rails: Right Home Layout: Two level;Able to live on main level with bedroom/bathroom     Bathroom Shower/Tub: Arts development officer Toilet: Handicapped height     Home Equipment: Agricultural consultant (2 wheels);Rollator (4 wheels);Cane - single point;Shower seat - built in;BSC/3in1   Additional Comments: history and PLOF obtained from previous admission      Prior Functioning/Environment Prior Level of Function : Independent/Modified Independent;History of Falls (last six months)             Mobility Comments: Per last admission, Mod Ind amb community distances with a SPC, 2 falls in the last year secondary to tripping. After last admission, has been using RW for mobility ADLs Comments: Mostly Ind with ADLs, more recently wife states he has been needing help with IADLs due to cognition and help with meal prep for safety concerns. A few months ago pt was golfing multiple times a week and much clear thinking per report from pt's wife.    OT Problem List: Decreased strength;Decreased activity tolerance;Impaired balance (sitting and/or standing);Decreased cognition;Decreased safety awareness;Decreased knowledge of use of DME or AE;Decreased knowledge of precautions;Pain   OT Treatment/Interventions: Self-care/ADL training;Energy conservation;DME and/or AE instruction;Therapeutic activities;Balance training;Patient/family education;Therapeutic exercise;Cognitive remediation/compensation      OT Goals(Current goals can be found in the care plan section)   Acute Rehab OT Goals Patient Stated Goal: go home OT Goal Formulation: With patient Time For Goal Achievement: 05/23/24 Potential to Achieve  Goals: Good ADL Goals Pt Will Transfer to Toilet: with supervision;ambulating (LRAD) Pt Will Perform Toileting - Clothing Manipulation and hygiene: with supervision;sit to/from stand Additional ADL Goal #1: Pt will follow simple VC for sequencing/safety for ADL, 5/5 opportunities.   OT Frequency:  Min 2X/week    Co-evaluation PT/OT/SLP Co-Evaluation/Treatment: Yes Reason for Co-Treatment: To address functional/ADL transfers;Necessary to address cognition/behavior during functional activity;For patient/therapist safety PT goals addressed during session: Mobility/safety with mobility;Balance;Proper use of DME OT goals addressed during session: ADL's and self-care      AM-PAC OT 6 Clicks Daily Activity     Outcome Measure Help from another person eating meals?: None Help from another person taking care of personal grooming?: A Little Help from another person toileting, which includes using toliet, bedpan, or urinal?: A Little Help from another person bathing (including washing, rinsing, drying)?: A Lot Help from another person to put on and taking off regular upper body clothing?: A Little Help from another person to put on and taking off regular lower body clothing?: A Little 6 Click Score: 18   End of Session Equipment Utilized During Treatment: Rolling walker (2 wheels)  Activity Tolerance: Patient tolerated treatment well Patient left: in bed;with call bell/phone within reach;with bed alarm set  OT Visit Diagnosis: Unsteadiness on feet (R26.81);Other abnormalities of gait and mobility (R26.89);Repeated falls (R29.6);Muscle weakness (generalized) (M62.81);Other symptoms and signs involving cognitive function                Time: 1210-1221 OT Time Calculation (min): 11  min Charges:  OT General Charges $OT Visit: 1 Visit OT Evaluation $OT Eval Low Complexity: 1 Low  Warren SAUNDERS., MPH, MS, OTR/L ascom [phone] 05/09/24, 2:02 PM

## 2024-05-09 NOTE — Evaluation (Signed)
 Physical Therapy Evaluation Patient Details Name: Nathan Rivera MRN: [id number] DOB: 02/15/1946 Today's Date: 05/09/2024  History of Present Illness  78 y/o male presented to ED on 05/08/24 for low creatine, bradycardia, and hypotensive. Recent admission 7/21-8/5 after AAA rupture s/p repair. PMH: dementia, HTN, PAD, AAA rupture s/p repair 2025, CKD  Clinical Impression  Patient admitted with the above. PTA, patient lives with wife and per chart review, he had been using RW since last admission (d/c 04/30/24). Prior to most recent hospitalization, patient was ambulatory with [facility] for community distances. Patient presents with weakness, impaired balance, and decreased activity tolerance. Required CGA-minA for bed mobility and sit to stand with RW. Able to take sidesteps at EOB with CGA and RW. Unable to progress gait due to lack of portable IV pole in ED. VSS on RA. Patient will benefit from skilled PT services during acute stay to address listed deficits. Patient will benefit from ongoing therapy at discharge to maximize functional independence and safety.         If plan is discharge home, recommend the following: Assist for transportation;Help with stairs or ramp for entrance;A little help with walking and/or transfers;A lot of help with bathing/dressing/bathroom;Assistance with cooking/housework;Direct supervision/assist for medications management   Can travel by private vehicle   Yes    Equipment Recommendations BSC/3in1  Recommendations for Other Services       Functional Status Assessment Patient has had a recent decline in their functional status and demonstrates the ability to make significant improvements in function in a reasonable and predictable amount of time.     Precautions / Restrictions Precautions Precautions: Fall Recall of Precautions/Restrictions: Impaired Restrictions Weight Bearing Restrictions Per Provider Order: No      Mobility  Bed Mobility Overal bed  mobility: Needs Assistance Bed Mobility: Supine to Sit, Sit to Supine     Supine to sit: Contact guard, Min assist Sit to supine: Contact guard assist        Transfers Overall transfer level: Needs assistance Equipment used: Rolling Connee Ikner (2 wheels) Transfers: Sit to/from Stand Sit to Stand: Contact guard assist           General transfer comment: CGA for safety from elevated stretcher    Ambulation/Gait Ambulation/Gait assistance: Contact guard assist Gait Distance (Feet): 4 Feet Assistive device: Rolling [place] (2 wheels)   Gait velocity: decreased     General Gait Details: sidesteps at EOB. Unable to progress due to lack of portable IV pole  Stairs            Wheelchair Mobility     Tilt Bed    Modified Rankin (Stroke Patients Only)       Balance Overall balance assessment: Needs assistance Sitting-balance support: Feet supported Sitting balance-Leahy Scale: Fair     Standing balance support: Bilateral upper extremity supported, During functional activity Standing balance-Leahy Scale: Fair                               Pertinent Vitals/Pain Pain Assessment Pain Assessment: No/denies pain    Home Living Family/patient expects to be discharged to:: Private residence Living Arrangements: Spouse/significant other Available Help at Discharge: Family;Available 24 hours/day Type of Home: House Home Access: Stairs to enter Entrance Stairs-Rails: Right Entrance Stairs-Number of Steps: 3   Home Layout: Two level;Able to live on main level with bedroom/bathroom Home Equipment: Rolling Lesa Vandall (2 wheels);Rollator (4 wheels);Cane - single point;Shower seat - built in;BSC/3in1 Additional  Comments: history and PLOF obtained from previous admission    Prior Function Prior Level of Function : Independent/Modified Independent;History of Falls (last six months)             Mobility Comments: Per last admission, Mod Ind amb community  distances with a SPC, 2 falls in the last year secondary to tripping. After last admission, has been using RW for mobility ADLs Comments: Mostly Ind with ADLs, more recently wife states he has been needing help with IADLs due to cognition and help with meal prep for safety concerns. A few months ago pt was golfing multiple times a week and much clear thinking per report from pt's wife.     Extremity/Trunk Assessment   Upper Extremity Assessment Upper Extremity Assessment: Defer to OT evaluation    Lower Extremity Assessment Lower Extremity Assessment: Generalized weakness    Cervical / Trunk Assessment Cervical / Trunk Assessment: Normal  Communication   Communication Communication: No apparent difficulties    Cognition Arousal: Alert Behavior During Therapy: WFL for tasks assessed/performed   PT - Cognitive impairments: History of cognitive impairments, Problem solving, Safety/Judgement, Memory, Sequencing, Awareness, Attention, Initiation   Orientation impairments: Place, Time, Situation                   PT - Cognition Comments: hx of dementia Following commands: Impaired Following commands impaired: Follows one step commands inconsistently, Follows one step commands with increased time     Cueing Cueing Techniques: Verbal cues, Tactile cues     General Comments General comments (skin integrity, edema, etc.): VSS on RA    Exercises     Assessment/Plan    PT Assessment Patient needs continued PT services  PT Problem List Decreased strength;Decreased activity tolerance;Decreased balance;Decreased mobility;Decreased knowledge of use of DME;Decreased cognition;Decreased coordination;Decreased safety awareness       PT Treatment Interventions DME instruction;Gait training;Stair training;Functional mobility training;Therapeutic activities;Therapeutic exercise;Balance training;Patient/family education;Cognitive remediation    PT Goals (Current goals can be  found in the Care Plan section)  Acute Rehab PT Goals Patient Stated Goal: did not state PT Goal Formulation: Patient unable to participate in goal setting Time For Goal Achievement: 05/23/24 Potential to Achieve Goals: Fair    Frequency Min 2X/week     Co-evaluation               AM-PAC PT 6 Clicks Mobility  Outcome Measure Help needed turning from your back to your side while in a flat bed without using bedrails?: None Help needed moving from lying on your back to sitting on the side of a flat bed without using bedrails?: A Little Help needed moving to and from a bed to a chair (including a wheelchair)?: A Little Help needed standing up from a chair using your arms (e.g., wheelchair or bedside chair)?: A Little Help needed to walk in hospital room?: A Little Help needed climbing 3-5 steps with a railing? : A Lot 6 Click Score: 18    End of Session   Activity Tolerance: Patient tolerated treatment well Patient left: in bed;with call bell/phone within reach;with bed alarm set Nurse Communication: Mobility status PT Visit Diagnosis: Other abnormalities of gait and mobility (R26.89);Muscle weakness (generalized) (M62.81);Difficulty in walking, not elsewhere classified (R26.2);Unsteadiness on feet (R26.81)    Time: 1210-1221 PT Time Calculation (min) (ACUTE ONLY): 11 min   Charges:   PT Evaluation $PT Eval Moderate Complexity: 1 Mod   PT General Charges $$ ACUTE PT VISIT: 1 Visit  Maryanne Finder, PT, DPT Physical Therapist - [place]  [facility]   Meliss Fleek A Rajanae Mantia 05/09/2024, 1:42 PM

## 2024-05-09 NOTE — Progress Notes (Signed)
 Progress Note   Patient: Nathan Rivera FMW:[id number] DOB: 03/13/1946 DOA: 05/08/2024     1 DOS: the patient was seen and examined on 05/09/2024   Brief hospital course: From HPI Nathan Rivera is a pleasant 78 y.o. male with medical history significant for AAA rupture s/p repair 2 weeks ago, HTN, PAD, dementia, CKD presenting to the emergency room at [facility] from nephrologist office for evaluation of generalized weakness, hypotension and bradycardia.  Patient reports that he has been feeling weak for the last few days.  He was at nephrologist office for follow-up visit today when he was noted to be bradycardic with a heart rate in the 30s and hypotensive with SBP of 80.  He was sent over to ED for further evaluation by nephrologist Dr. Dennise.  Patient was taking metoprolol  and primary care recently decreased dose to 12.5 twice a day from 25 twice a day, patient was taking amlodipine  10, patient also taking Aricept .  Patient denies any chest pain, shortness of breath, fever, chills, nausea, vomiting.     Patient and his daughter and wife were at bedside.  Patient was not able to provide me any meaningful history but patient's wife provided most of the history.   ED Course: Upon arrival to the ED, patient is found to be hypotensive and bradycardic.  Patient also had AKI.  Patient recently was admitted to [facility] and was discharged on 04/30/2024 after being treated for saccular aneurysm of abdominal aortic aneurysm.  He underwent endoleak repair, renal artery stenting and discharged with improving creatinine.  Hospitalist service was consulted for evaluation for admission for hypotension bradycardia and generalized weakness.    Assessment and Plan:    1.  Generalized weakness, bradycardia, hypotension - May be related to metoprolol  and Aricept  patient is taking. Monitor blood pressure closely Continue telemetry Bradycardia improved PT OT on board   2.   Bradycardia/hypotension Bradycardia improved I have discussed with cardiologist Monitor telemetry closely   3.  AKI on CKD - Patient was evaluated at outpatient clinic by Dr. Dennise - Will call nephrology for evaluation - Will defer to nephrologist for if we need to give him IV fluid   4.  Recent history of aortic aneurysm repair for endoleak and stent placement - Does not have any symptoms - Will continue to monitor   5.  HTN/HLD - Blood pressure is low - Will continue to monitor      DVT prophylaxis: Xarelto  Code Status: Full Code Family Communication: wife and daughter at beside  Disposition Plan: Home  Consults called: Cardiology and Nephrology  Admission status: Inpatient, Telemetry bed     Subjective:   Patient seen and examined at bedside this morning Denies nausea vomiting abdominal pain Bradycardia improved as well as hypotension  Physical Exam: General: Well appearing elderly male, well nourished, in no acute distress. HEENT: Normocephalic and atraumatic. Neck: No JVD.   Lungs: Normal respiratory effort on room air. Clear bilaterally to auscultation. No wheezes, crackles, rhonchi.  Heart: HRRR. Normal S1 and S2 without gallops or murmurs.  Abdomen: Non-distended appearing.  Msk: Normal strength and tone for age. Extremities: Warm and well perfused. No clubbing, cyanosis, edema.  Neuro: Alert and oriented X 3. Psych: Answers questions appropriately  Vitals:   05/09/24 1130 05/09/24 1200 05/09/24 1352 05/09/24 1703  BP: (!) 149/76 (!) 156/76 131/70 137/77  Pulse: (!) 54 (!) 57 (!) 57 62  Resp: 13 13 13 17   Temp:   97.6 F (  36.4 C) 97.7 F (36.5 C)  TempSrc:   Oral Oral  SpO2: 95% 97% 96% 99%    Data Reviewed:    Latest Ref Rng & Units 05/09/2024    3:50 AM 05/08/2024   12:16 PM 04/30/2024    4:33 AM  CBC  WBC 4.0 - 10.5 K/uL 8.7  11.2  12.0   Hemoglobin 13.0 - 17.0 g/dL 8.4  9.3  8.0   Hematocrit 39.0 - 52.0 % 26.4  29.2  25.2   Platelets 150 -  400 K/uL 290  337  217        Latest Ref Rng & Units 05/09/2024    3:50 AM 05/08/2024   12:16 PM 04/30/2024    4:33 AM  BMP  Glucose 70 - 99 mg/dL 90  864  90   BUN 8 - 23 mg/dL 51  53  41   Creatinine 0.61 - 1.24 mg/dL 6.48  6.10  7.65   Sodium 135 - 145 mmol/L 139  139  140   Potassium 3.5 - 5.1 mmol/L 3.8  3.6  3.7   Chloride 98 - 111 mmol/L 113  109  111   CO2 22 - 32 mmol/L 19  18  22    Calcium 8.9 - 10.3 mg/dL 8.6  9.0  8.4      Author: Drue ONEIDA Potter, MD 05/09/2024 6:25 PM  For on call review [email or web address].

## 2024-05-09 NOTE — Progress Notes (Addendum)
 [facility] CLINIC CARDIOLOGY PROGRESS NOTE       Patient ID: [name] MRN: [id number] DOB/AGE: [birth year] 78 y.o.  Admit date: 05/08/2024 Referring Physician Dr. Roann Primary Physician Feldpausch, Cheryl BRAVO, MD Primary Cardiologist None Reason for Consultation Symptomatic bradycardia  HPI: Nathan Rivera is a 78 y.o. male  with a past medical history of hypertension, PAD, AAA rupture s/p repair 2 weeks ago (hospitalization 07/21 - 08/05), dementia, CKD who presented to the ED on 05/08/2024 for bradycardia, hypotension, and generalized weakness sent from nephrology outpatient due to SBP 80s and HR 30s. Patient denies hx bradycardia. Denies lightheadedness, chest pain, SOB, palpitations or syncope. EKG in ED with sinus bradycardia, rate 40 bpm. Cardiology was consulted for further evaluation of possible symptomatic bradycardia.  Interval History: -Patient seen and examined this AM and laying comfortably in hospital bed with family at bedside. Patient states he feels good overall and continues to deny any chest pain, palpitations, SOB, lightheadedness/dizziness. -Patients BP and HR stable this AM.  Per telemetry remains in sinus rhythm, rate 50s-60s.  Last night had 1 episode of heart rate 39 bpm at that time patient and family states pt remained aSX. Otherwise HR stable mainly 40-60s -Patient remains on room air with stable SpO2.  -Patient worked with PT today and did well. Patient remained aSX and HR remains 60s. PT states patient appears at baseline and would recommend home health.    Review of systems complete and found to be negative unless listed above    Past Medical History:  Diagnosis Date   Alzheimer dementia [facility])    Aortic aneurysm (HCC)    Arthritis    knees are the biggest problem   Cancer (HCC) 2012   bladder   CKD (chronic kidney disease), stage III (HCC) 2014   relative to infection, septic- renal calculi   Degenerative disc disease    Degenerative joint disease     GERD (gastroesophageal reflux disease)    History of kidney stones    Hypercholesterolemia    Hypertension    no meds currently   Impaired glucose tolerance    Muscle fatigue    muscle fatigue relative to sepsis experience    Peripheral vascular disease (HCC)    Pneumonia    Raynaud's disease    Sepsis (HCC) 07/2013   Stroke (HCC)    Thromboembolism of left popilteal vein 2015   pt. unsure which leg, treating with Xarelto    UTI (urinary tract infection) 08/17/2013   Lt nephrostomy tube and stent placement    Past Surgical History:  Procedure Laterality Date   ABDOMINAL AORTIC ANEURYSM REPAIR  2011   CATARACT EXTRACTION W/PHACO Left 02/15/2021   Procedure: CATARACT EXTRACTION PHACO AND INTRAOCULAR LENS PLACEMENT (IOC) LEFT 5.59 00:44.0;  Surgeon: Myrna Adine Anes, MD;  Location: [facility];  Service: Ophthalmology;  Laterality: Left;   CATARACT EXTRACTION W/PHACO Right 03/08/2021   Procedure: CATARACT EXTRACTION PHACO AND INTRAOCULAR LENS PLACEMENT (IOC) RIGHT 1.81 00:22.7;  Surgeon: Myrna Adine Anes, MD;  Location: [facility];  Service: Ophthalmology;  Laterality: Right;   ENDOVASCULAR REPAIR/STENT GRAFT N/A 04/26/2024   Procedure: ENDOVASCULAR REPAIR/STENT GRAFT;  Surgeon: Jama Cordella MATSU, MD;  Location: [facility] INVASIVE CV LAB;  Service: Cardiovascular;  Laterality: N/A;   ESOPHAGOGASTRODUODENOSCOPY N/A 04/16/2024   Procedure: EGD (ESOPHAGOGASTRODUODENOSCOPY);  Surgeon: Jinny Carmine, MD;  Location: [facility] ENDOSCOPY;  Service: Endoscopy;  Laterality: N/A;   HERNIA REPAIR     55mo old   INSERTION OF MESH N/A  10/24/2014   Procedure: INSERTION OF MESH;  Surgeon: Deward Null III, MD;  Location: [facility] OR;  Service: General;  Laterality: N/A;   LAPAROTOMY N/A 08/24/2013   Procedure: EXPLORATORY LAPAROTOMY;  Surgeon: Deward GORMAN Null DOUGLAS, MD;  Location: [facility] OR;  Service: General;  Laterality: N/A;  Gram patch closure   litrotripsy  09/24/2013   NEPHROSTOMY  Nov. 2014   nephrostomy  removal  Dec. 2014   PERIPHERALLY INSERTED CENTRAL CATHETER INSERTION     RENAL ANGIOGRAPHY Bilateral 04/19/2024   Procedure: RENAL ANGIOGRAPHY;  Surgeon: Marea Selinda GORMAN, MD;  Location: [facility] INVASIVE CV LAB;  Service: Cardiovascular;  Laterality: Bilateral;   TONSILLECTOMY     UMBILICAL HERNIA REPAIR     50 yr ago   VENTRAL HERNIA REPAIR N/A 10/24/2014   Procedure: LAPAROSCOPIC ASSISTED VENTRAL HERNIA REPAIR WITH MESH;  Surgeon: Deward Null III, MD;  Location: [facility] OR;  Service: General;  Laterality: N/A;   XI ROBOTIC ASSISTED INGUINAL HERNIA REPAIR WITH MESH Bilateral 11/17/2022   Procedure: XI ROBOTIC ASSISTED INGUINAL HERNIA REPAIR WITH MESH-Bilateral;  Surgeon: Desiderio Schanz, MD;  Location: [facility] ORS;  Service: General;  Laterality: Bilateral;    (Not in a hospital admission)  Social History   Socioeconomic History   Marital status: Married    Spouse name: THeresa   Number of children: Not on file   Years of education: Not on file   Highest education level: Not on file  Occupational History   Not on file  Tobacco Use   Smoking status: Every Day    Current packs/day: 1.00    Average packs/day: 1 pack/day for 40.0 years (40.0 ttl pk-yrs)    Types: Cigarettes    Passive exposure: Past   Smokeless tobacco: Never   Tobacco comments:    since age 62  Vaping Use   Vaping status: Never Used  Substance and Sexual Activity   Alcohol use: Yes    Alcohol/week: 3.0 standard drinks of alcohol    Types: 3 Glasses of wine per week   Drug use: No   Sexual activity: Not on file  Other Topics Concern   Not on file  Social History Narrative   Not on file   Social Drivers of Health   Financial Resource Strain: Low Risk  (07/19/2023)   Received from [facility]   Overall Financial Resource Strain (CARDIA)    Difficulty of Paying Living Expenses: Not hard at all  Food Insecurity: No Food Insecurity (04/16/2024)   Hunger Vital Sign    Worried About Running Out of Food in the  Last Year: Never true    Ran Out of Food in the Last Year: Never true  Transportation Needs: No Transportation Needs (04/16/2024)   PRAPARE - Administrator, Civil Service (Medical): No    Lack of Transportation (Non-Medical): No  Physical Activity: Not on file  Stress: Not on file  Social Connections: Unknown (04/17/2024)   Social Connection and Isolation Panel    Frequency of Communication with Friends and Family: Once a week    Frequency of Social Gatherings with Friends and Family: Twice a week    Attends Religious Services: Not on Marketing executive or Organizations: No    Attends Banker Meetings: 1 to 4 times per year    Marital Status: Married  Catering manager Violence: Not At Risk (04/16/2024)   Humiliation, Afraid, Rape, and Kick questionnaire    Fear of  Current or Ex-Partner: No    Emotionally Abused: No    Physically Abused: No    Sexually Abused: No    Family History  Problem Relation Age of Onset   CVA Father    Atrial fibrillation Sister      Vitals:   05/09/24 0800 05/09/24 0830 05/09/24 0900 05/09/24 0930  BP: (!) 145/71 132/83 122/63 115/78  Pulse: 60 (!) 58 (!) 58 (!) 56  Resp: 17 16 19  (!) 22  Temp:      TempSrc:      SpO2: 93% 94% 97% 96%    PHYSICAL EXAM General: Well appearing elderly male, well nourished, in no acute distress. HEENT: Normocephalic and atraumatic. Neck: No JVD.   Lungs: Normal respiratory effort on room air. Clear bilaterally to auscultation. No wheezes, crackles, rhonchi.  Heart: HRRR. Normal S1 and S2 without gallops or murmurs.  Abdomen: Non-distended appearing.  Msk: Normal strength and tone for age. Extremities: Warm and well perfused. No clubbing, cyanosis, edema.  Neuro: Alert and oriented X 3. Psych: Answers questions appropriately.   Labs: Basic Metabolic Panel: Recent Labs    05/08/24 1216 05/09/24 0350  NA 139 139  K 3.6 3.8  CL 109 113*  CO2 18* 19*  GLUCOSE 135* 90  BUN  53* 51*  CREATININE 3.89* 3.51*  CALCIUM 9.0 8.6*  MG 1.9  --    Liver Function Tests: Recent Labs    05/08/24 1216 05/09/24 0350  AST 29 48*  ALT 19 25  ALKPHOS 71 126  BILITOT 1.4* 1.3*  PROT 6.8 6.3*  ALBUMIN  3.1* 2.9*   No results for input(s): LIPASE, AMYLASE in the last 72 hours. CBC: Recent Labs    05/08/24 1216 05/09/24 0350  WBC 11.2* 8.7  NEUTROABS 7.9*  --   HGB 9.3* 8.4*  HCT 29.2* 26.4*  MCV 97.7 96.4  PLT 337 290   Cardiac Enzymes: Recent Labs    05/08/24 1216 05/08/24 1402  TROPONINIHS 11 10   BNP: No results for input(s): BNP in the last 72 hours. D-Dimer: No results for input(s): DDIMER in the last 72 hours. Hemoglobin A1C: No results for input(s): HGBA1C in the last 72 hours. Fasting Lipid Panel: No results for input(s): CHOL, HDL, LDLCALC, TRIG, CHOLHDL, LDLDIRECT in the last 72 hours. Thyroid Function Tests: Recent Labs    05/08/24 1216  TSH 3.426   Anemia Panel: No results for input(s): VITAMINB12, FOLATE, FERRITIN, TIBC, IRON, RETICCTPCT in the last 72 hours.   Radiology: PERIPHERAL VASCULAR CATHETERIZATION Result Date: 04/26/2024 See surgical note for result.  PERIPHERAL VASCULAR CATHETERIZATION Result Date: 04/19/2024 See surgical note for result.  DG Chest Port 1 View Result Date: 04/18/2024 CLINICAL DATA:  Respiratory failure. EXAM: PORTABLE CHEST 1 VIEW COMPARISON:  04/17/2024 FINDINGS: Stable asymmetric elevation right hemidiaphragm with right base atelectasis/infiltrate and probable small right pleural effusion. There is pulmonary vascular congestion without overt pulmonary edema. Cardiopericardial silhouette is at upper limits of normal for size. No acute bony abnormality. Telemetry leads overlie the chest. IMPRESSION: 1. Stable asymmetric elevation right hemidiaphragm with right base atelectasis/infiltrate and probable small right pleural effusion. 2. Pulmonary vascular congestion without  overt pulmonary edema. Electronically Signed   By: Camellia Candle M.D.   On: 04/18/2024 08:58   CT ABDOMEN PELVIS WO CONTRAST Result Date: 04/17/2024 CLINICAL DATA:  Ileus or obstruction. EXAM: CT ABDOMEN AND PELVIS WITHOUT CONTRAST TECHNIQUE: Multidetector CT imaging of the abdomen and pelvis was performed following the standard protocol without IV contrast.  RADIATION DOSE REDUCTION: This exam was performed according to the departmental dose-optimization program which includes automated exposure control, adjustment of the mA and/or kV according to patient size and/or use of iterative reconstruction technique. COMPARISON:  CT dated 04/15/2024. FINDINGS: Evaluation of this exam is limited in the absence of intravenous contrast. Lower chest: There is consolidation of the majority of the visualized right middle and right lower lobes. Debris noted in the right mainstem bronchus and right middle and right lower lobes. There is left lung base subpleural atelectasis. There is coronary vascular calcification. No intra-abdominal free air.  Small subhepatic fluid. Hepatobiliary: The liver is unremarkable. Vicarious excretion of contrast from prior study noted in the gallbladder. There is a stone in the cystic duct. No pericholecystic fluid or evidence of acute cholecystitis by CT. Pancreas: The pancreas is atrophic.  No active inflammatory changes. Spleen: Small scattered calcified splenic granuloma. Adrenals/Urinary Tract: The adrenal glands are unremarkable. Moderately atrophic kidneys. Small renal cysts and additional subcentimeter hypodense lesions which are too small to characterize. A 2 cm high attenuating lesion from the superior pole of the right kidney is not characterized but may represent a complex/proteinaceous cyst. This is similar to prior CT. Further evaluation with ultrasound on a nonemergent/outpatient basis recommended. There is no hydronephrosis on either side. Residual contrast noted in the renal  collecting systems bilaterally as well as in the urinary bladder. The visualized ureters and urinary bladder appear unremarkable. Stomach/Bowel: There is diffuse colonic diverticulosis. No active inflammation. There is diffuse dilatation of small-bowel loops measuring up to 3.8 cm. There is no discrete transition. A gradual transition noted in the right lower quadrant. Findings favor to represent an ileus and less likely a partial small bowel obstruction. Small-bowel series may provide better evaluation. The appendix is normal. Vascular/Lymphatic: An aorto bi iliac endovascular stent graft repair noted. The aorta is tortuous. There is a 3.7 cm aneurysmal dilatation of the infrarenal abdominal aorta. Bilateral common iliac aneurysms measure up to 4 cm on the left. The IVC is unremarkable. No portal venous gas. There is no adenopathy. Reproductive: The prostate is grossly unremarkable. Other: None Musculoskeletal: Osteopenia with degenerative changes of the spine and hips. No acute osseous pathology. IMPRESSION: 1. Diffuse dilatation of small-bowel loops without discrete transition. Findings favored to represent an ileus and less likely a partial small bowel obstruction. Small-bowel series may provide better evaluation. 2. Colonic diverticulosis. Normal appendix. 3. Cholelithiasis. 4. Aneurysmal dilatation of the abdominal aorta measure up to 3.7 cm. An aorto bi iliac endovascular stent graft repair is seen. Recommend follow-up ultrasound every 3 years. (Ref.: J Vasc Surg. 2018; 67:2-77 and J Am Coll Radiol 2013;10(10):789-794.) 5. Debris in the bronchus intermedius with probable postobstructive atelectasis or pneumonia in the right middle and right lower lobes. Follow-up to resolution recommended. Electronically Signed   By: Vanetta Chou M.D.   On: 04/17/2024 18:37   DG Abd 1 View Result Date: 04/17/2024 CLINICAL DATA:  Abdominal pain. EXAM: ABDOMEN - 1 VIEW COMPARISON:  September 16, 2013. FINDINGS: Mildly  dilated small bowel loops are noted without colonic dilatation. Mild amount of stool seen throughout the colon. Status post stent graft repair of abdominal aorta. IMPRESSION: Mild small bowel dilatation is noted concerning for distal small bowel obstruction or possibly ileus. Electronically Signed   By: Lynwood Landy Raddle M.D.   On: 04/17/2024 14:52   DG Chest Port 1 View Result Date: 04/17/2024 CLINICAL DATA:  Respiratory failure with hypoxia. EXAM: PORTABLE CHEST 1 VIEW COMPARISON:  April 15, 2024. FINDINGS: Stable cardiomediastinal silhouette. Mild central pulmonary vascular congestion is noted. Elevated right hemidiaphragm is noted with minimal right basilar subsegmental atelectasis. Bony thorax is unremarkable. IMPRESSION: Mild central pulmonary vascular congestion is noted. Elevated right hemidiaphragm with minimal right basilar subsegmental atelectasis. Electronically Signed   By: Lynwood Landy Raddle M.D.   On: 04/17/2024 14:50   CT Angio Chest/Abd/Pel for Dissection W and/or Wo Contrast Result Date: 04/15/2024 CLINICAL DATA:  Coughing up blood EXAM: CT ANGIOGRAPHY CHEST, ABDOMEN AND PELVIS TECHNIQUE: Non-contrast CT of the chest was initially obtained. Multidetector CT imaging through the chest, abdomen and pelvis was performed using the standard protocol during bolus administration of intravenous contrast. Multiplanar reconstructed images and MIPs were obtained and reviewed to evaluate the vascular anatomy. RADIATION DOSE REDUCTION: This exam was performed according to the departmental dose-optimization program which includes automated exposure control, adjustment of the mA and/or kV according to patient size and/or use of iterative reconstruction technique. CONTRAST:  80mL OMNIPAQUE  IOHEXOL  350 MG/ML SOLN COMPARISON:  CT 10/20/2022 FINDINGS: CTA CHEST FINDINGS Cardiovascular: Non contrasted images of the chest demonstrate no acute intramural hematoma. Negative for aortic dissection. Focal outpouching  measuring 14 mm at the distal arch either representing prominent ductus diverticulum or aneurysm, but sludge least stable size compared with noncontrast CT from 2015. No extravasation identified within the chest. Coronary vascular calcification. Normal cardiac size. No pericardial effusion. Mediastinum/Nodes: Patent trachea. No thyroid mass. No suspicious lymph nodes. Esophagus within normal limits. Calcified nodes consistent with prior granulomatous disease. Lungs/Pleura: No pleural effusion or pneumothorax. Patchy mild peribronchovascular ground-glass disease. No consolidation. Musculoskeletal: Sternum appears intact. No acute osseous abnormality. Review of the MIP images confirms the above findings. CTA ABDOMEN AND PELVIS FINDINGS VASCULAR Aorta: Aortic atherosclerosis. Status post infrarenal abdominal aortoiliac stent graft. The proximal end of the stent is just below the level of the renal arteries. Aneurysmal dilatation of the abdominal aorta at the level of the renal arteries, slightly superior to the proximal end of the stent, this measures 4.6 x 3.7 cm on axial series 6, image 180, previously 4.2 by 3.2 cm. Saccular appearing component of the aneurysm at the level of the stent, coronal series 11, image 74, this appears increased in size compared to the non contrasted exam from January and there may be some soft tissue thickening along the saccular portion of the aneurysm on coronal series 11, image 73. No dissection is seen. 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 Possible small 5 mm aneurysm at the origin of the celiac artery, series 12, image 108. SMA: Mild-to-moderate stenosis of the proximal SMA just distal to the origin, series 12, image 111. Distal vessel patency without dissection. Renals: Accessory upper pole right renal artery with severe stenosis at the origin, series 11, image 80. 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 String like flow at the proximal right renal artery, series 11 image 75, consistent with severe stenosis over a 12 mm length. Small accessory upper pole renal artery on the left with suspected severe stenosis at the origin. Dominant left renal artery with severe stenosis at the origin, series 6, image 172 and coronal series 11 image 74 and 75. IMA: Poorly visible, small vessel supplying the perirectal is traceable to the iliac stent supplying the left iliac vessel. 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 Aneurysmal  dilatation of the right common iliac artery at the distal end of the stent measuring up to 2.8 cm on series 11, image 73. Multiple small aneurysms of the proximal internal and iliac vessels. No high-grade stenosis or occlusive disease of the distal external iliac vessels. Veins: Suboptimally assessed Review of the MIP images confirms the above findings. NON-VASCULAR Hepatobiliary: Small gallstone. No biliary dilatation. No focal hepatic abnormality. Pancreas: Unremarkable. No pancreatic ductal dilatation or surrounding inflammatory changes. Spleen: Multiple calcified granuloma Adrenals/Urinary Tract: Adrenal glands are within normal limits. Kidneys show no hydronephrosis. Multiple renal cysts for which no imaging follow-up is recommended. Slightly thick-walled urinary bladder. Indeterminate slightly dense exophytic lesion off the upper pole right kidney measuring 17 mm, series 6, image 168. Stomach/Bowel: The stomach is non enlarged. There is no dilated small bowel. No acute bowel wall thickening. Negative appendix. Diverticular disease of the colon. Lymphatic: No suspicious lymph nodes. Reproductive: Negative prostate Other: Negative for pelvic effusion or free air. Previously noted bilateral inguinal hernias are resolved/repair. Musculoskeletal: No acute or suspicious osseous abnormality. Review  of the MIP images confirms the above findings. IMPRESSION: 1. Negative for acute aortic dissection or intramural hematoma. No extravasation identified within the chest, abdomen, or pelvis. 2. Status post infrarenal abdominal aortoiliac stent graft. Aneurysmal dilatation of the abdominal aorta at the level of the renal arteries, slightly superior to the proximal end of the stent, this measures 4.6 x 3.7 cm, previously 4.2 x 3.2 cm. Saccular appearing component of the aneurysm at the level of the proximal stent appears increased in size compared to the non contrasted exam from January and there may be some soft tissue thickening and stranding along the inferior aspect of the saccular part of the aneurysm raising concern for possible leakage or impending rupture, vascular surgery consultation is recommended. 3. 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 Aneurysmal dilatation of the right common iliac artery at the distal end of the stent measuring up to 2.8 cm. Multiple small aneurysms of the proximal internal and iliac vessels. 4. 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 There are small accessory upper pole renal arteries bilaterally with suspected severe stenosis at the origin of these vessels. 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 5. 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 Possible small 5 mm aneurysm at the origin of the celiac artery. 6. 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 Patchy mild peribronchovascular ground-glass disease within the lungs, suggestive of respiratory infection. No consolidative pneumonia. 8. Cholelithiasis. Diverticular disease of the colon. 9. 16 mm indeterminate exophytic lesion off the upper pole right kidney, could initially correlate with ultrasound 10. Slightly thick-walled urinary bladder,  correlate for cystitis. 11. Interval bilateral inguinal hernia repair. 12. Aortic atherosclerosis. Aortic Atherosclerosis (ICD10-I70.0). Electronically Signed   By: Luke Bun M.D.   On: 04/15/2024 19:47   DG Chest 2 View Result Date: 04/15/2024 CLINICAL DATA:  Hemoptysis since last night, worsening today. Increased lower extremity swelling for 1 week. EXAM: CHEST - 2 VIEW COMPARISON:  Radiographs 08/26/2013 and 08/24/2013. Chest CT 04/01/2014. FINDINGS: The lateral view was repeated. The heart size and mediastinal contours are normal. 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 The lungs appear clear. No pleural effusion or pneumothorax. Calcified splenic granulomas are noted. Possible old rib fractures bilaterally without acute osseous abnormality. IMPRESSION: No radiographic evidence of  active cardiopulmonary process. If the patient has persistent unexplained hemoptysis, follow-up CT should be considered. Electronically Signed   By: Elsie Perone M.D.   On: 04/15/2024 15:17    ECHO ordered (scheduled for outpatient if not performed in hospital)  TELEMETRY reviewed by me 05/09/2024: sinus rhythm, rate 50 - 60s  EKG reviewed by me: sinus bradycardia, rate 40 bpm  Data reviewed by me 05/09/2024: last 24h vitals tele labs imaging I/O hospitalist progress notes.  Principal Problem:   Bradycardia Active Problems:   PAD (peripheral artery disease) (HCC)   Hyperlipidemia   Hypotension, unspecified   Abdominal aneurysm (HCC)    ASSESSMENT AND PLAN:  Nathan Rivera is a 78 y.o. male  with a past medical history of hypertension, PAD, AAA rupture s/p repair 2 weeks ago (hospitalization 07/21 - 08/05), dementia, CKD who presented to the ED on 05/08/2024 for bradycardia, hypotension, and generalized weakness sent from nephrology outpatient due to SBP 80s and HR 30s. Patient denies hx bradycardia. Denies lightheadedness, chest pain, SOB, palpitations or syncope. EKG in ED with sinus  bradycardia, rate 40 bpm. Cardiology was consulted for further evaluation of possible symptomatic bradycardia.  # Sinus bradycardia # Generalized weakness # Fatigue # Deconditioning  EKG with sinus bradycardia rate 40 bpm. Tele with sinus bradycardia as low as 38 bpm early on during admission. HR has remained stable 40-60s and patient remains aSX. During PT on 08/14, HR remains in 60s with no concerns. -Echo ordered, can be done outpatient. (Scheduled for 08/19 at 1PM) -Continue to monitor telemetry closely. (No evidence of significant pauses or high grade AVB). -Avoid all AVN blockers. (Hold home metoprolol ) -Recommend adequate hydration  -Unclear if sxs of worsening weakness/fatigue since hospital discharge is related to deconditioning or symptomatic bradycardia. For now will continue to monitor telemetry and patients symptoms. Patient has remains aSX since admission.  -Plan to place 72-hour cardiac Holter monitor for further evaluation upon discharge.  -Continued discussions if PPM is indicated will continue at outpatient follow-up, patient's family is requesting clearance from primary nephrologist prior to this, since GFR has been around 15.  Hypertension -Resume home amlodipine  10 mg daily.   # AKI on CKD -Management per primary team. -Nephrology following, appreciate recommendations.  Follow-up scheduled with Dr. Ammon on 08/21 at 10 AM.  This patient's plan of care was discussed and created with Dr. Ammon and he is in agreement.  Signed: Dorene Comfort, PA-C  05/09/2024, 10:41 AM [facility]

## 2024-05-09 NOTE — Progress Notes (Signed)
 Patient anxious and agitated. Patient is alert and oriented x 1. Daughter called to help calm down patient. Patient continued to be anxious. Daughter called Clinical research associate when she was done talking to patient. Daughter states that she or his wife will be up to stay the night with the patient. W[facility] NP notified. No new orders.

## 2024-05-09 NOTE — Progress Notes (Signed)
 Mobility Specialist - Progress Note   05/09/24 1700  Mobility  Activity Ambulated with assistance;Stood at bedside;Dangled on edge of bed  Level of Assistance Contact guard assist, steadying assist  Assistive Device Front wheel walker  Distance Ambulated (ft) 40 ft  Range of Motion/Exercises Active  Activity Response Tolerated well  Mobility Referral Yes  Mobility visit 1 Mobility  Mobility Specialist Start Time (ACUTE ONLY) 1639  Mobility Specialist Stop Time (ACUTE ONLY) 1705  Mobility Specialist Time Calculation (min) (ACUTE ONLY) 26 min   Pt resting in bed on RA upon entry. Pt moved to EOB modI utilizing MS and railing to pull himself up. Pt STS MinA and ambulates to hallway CGA for safety with RW. Pt given verbal and manual cuing to stay close to walker. Pt returned to room and sat in chair for 5 minute rest break before returning to bed and left with needs in reach. Bed alarm activated.   Guido Rumble Mobility Specialist 05/09/24, 5:08 PM

## 2024-05-10 ENCOUNTER — Other Ambulatory Visit: Payer: Self-pay

## 2024-05-10 ENCOUNTER — Encounter: Payer: Self-pay | Admitting: Hospitalist

## 2024-05-10 DIAGNOSIS — R001 Bradycardia, unspecified: Secondary | ICD-10-CM | POA: Diagnosis not present

## 2024-05-10 LAB — CBC
HCT: 26.9 % — ABNORMAL LOW (ref 39.0–52.0)
Hemoglobin: 8.6 g/dL — ABNORMAL LOW (ref 13.0–17.0)
MCH: 30.9 pg (ref 26.0–34.0)
MCHC: 32 g/dL (ref 30.0–36.0)
MCV: 96.8 fL (ref 80.0–100.0)
Platelets: 255 K/uL (ref 150–400)
RBC: 2.78 MIL/uL — ABNORMAL LOW (ref 4.22–5.81)
RDW: 16.6 % — ABNORMAL HIGH (ref 11.5–15.5)
WBC: 8.4 K/uL (ref 4.0–10.5)
nRBC: 0 % (ref 0.0–0.2)

## 2024-05-10 LAB — BASIC METABOLIC PANEL WITH GFR
Anion gap: 6 (ref 5–15)
BUN: 42 mg/dL — ABNORMAL HIGH (ref 8–23)
CO2: 21 mmol/L — ABNORMAL LOW (ref 22–32)
Calcium: 8.7 mg/dL — ABNORMAL LOW (ref 8.9–10.3)
Chloride: 113 mmol/L — ABNORMAL HIGH (ref 98–111)
Creatinine, Ser: 3.08 mg/dL — ABNORMAL HIGH (ref 0.61–1.24)
GFR, Estimated: 20 mL/min — ABNORMAL LOW (ref >60)
Glucose, Bld: 90 mg/dL (ref 70–99)
Potassium: 3.7 mmol/L (ref 3.5–5.1)
Sodium: 140 mmol/L (ref 135–145)

## 2024-05-10 MED ORDER — RIVAROXABAN 15 MG PO TABS
15.0000 mg | ORAL_TABLET | Freq: Every day | ORAL | 3 refills | Status: DC
  Filled 2024-05-10: qty 42, 42d supply, fill #0

## 2024-05-10 MED ORDER — RIVAROXABAN 15 MG PO TABS: 15.0000 mg | ORAL_TABLET | Freq: Every day | ORAL | 3 refills | Status: DC

## 2024-05-10 MED ORDER — RIVAROXABAN 15 MG PO TABS
15.0000 mg | ORAL_TABLET | Freq: Every day | ORAL | Status: DC
  Filled 2024-05-10: qty 1

## 2024-05-10 NOTE — Discharge Summary (Signed)
 Physician Discharge Summary   Patient: Nathan Rivera MRN: [id number] DOB: 1946-06-08  Admit date:     05/08/2024  Discharge date: 05/10/24  Discharge Physician: Drue ONEIDA Potter   PCP: Jeffie Cheryl BRAVO, MD   Recommendations at discharge:   Follow-up with cardiology   Discharge Diagnoses: Principal Problem:   Bradycardia Active Problems:   PAD (peripheral artery disease) (HCC)   Hyperlipidemia   Hypotension, unspecified   Abdominal aneurysm (HCC)  Resolved Problems:   * No resolved hospital problems. [facility] Course:  Brief hospital course: From HPI Nathan Rivera is a pleasant 78 y.o. male with medical history significant for AAA rupture s/p repair 2 weeks ago, HTN, PAD, dementia, CKD presenting to the emergency room at [facility] from nephrologist office for evaluation of generalized weakness, hypotension and bradycardia.  Patient reports that he has been feeling weak for the last few days.  He was at nephrologist office for follow-up visit today when he was noted to be bradycardic with a heart rate in the 30s and hypotensive with SBP of 80.  He was sent over to ED for further evaluation by nephrologist Dr. Dennise.  Patient was taking metoprolol  and primary care recently decreased dose to 12.5 twice a day from 25 twice a day, patient was taking amlodipine  10, patient also taking Aricept .  Patient denies any chest pain, shortness of breath, fever, chills, nausea, vomiting.     Patient and his daughter and wife were at bedside.  Patient was not able to provide me any meaningful history but patient's wife provided most of the history.   ED Course: Upon arrival to the ED, patient is found to be hypotensive and bradycardic.  Patient also had AKI.  Patient recently was admitted to [facility] and was discharged on 04/30/2024 after being treated for saccular aneurysm of abdominal aortic aneurysm.  He underwent endoleak repair, renal artery stenting and discharged with  improving creatinine.  Hospitalist service was consulted for evaluation for admission for hypotension bradycardia and generalized weakness.     Patient was seen by cardiologist and have had cardiac monitor placed for outpatient monitoring.. Patient's creatinine has improved and have been seen by nephrologist and cleared for discharge today and will follow-up as an outpatient.   Consultants: Cardiology, nephrology Procedures performed: As mentioned above Disposition: Home Diet recommendation:  Cardiac diet DISCHARGE MEDICATION: Allergies as of 05/10/2024       Reactions   Duloxetine Dermatitis        Medication List     STOP taking these medications    hydrALAZINE  100 MG tablet Commonly known as: APRESOLINE    metoprolol  tartrate 25 MG tablet Commonly known as: LOPRESSOR        TAKE these medications    acetaminophen  325 MG tablet Commonly known as: TYLENOL  Take 325 mg by mouth every 6 (six) hours as needed for mild pain.   amLODipine  10 MG tablet Commonly known as: NORVASC  Take 1 tablet (10 mg total) by mouth daily.   Aspirin  Low Dose 81 MG tablet Generic drug: aspirin  EC Take 1 tablet (81 mg total) by mouth daily. Swallow whole.   bisacodyl  5 MG EC tablet Generic drug: bisacodyl  Take 2 tablets (10 mg total) by mouth at bedtime.   bisacodyl  10 MG suppository Commonly known as: DULCOLAX Place 1 suppository (10 mg total) rectally daily as needed for severe constipation.   cholecalciferol 1000 units tablet Commonly known as: VITAMIN D Take 1,000 Units by mouth daily.  donepezil  10 MG tablet Commonly known as: ARICEPT  Take 10 mg by mouth at bedtime.   FOLIC ACID PO Take by mouth.   gabapentin  100 MG capsule Commonly known as: NEURONTIN  200 mg 2 (two) times daily. 200 mg in am and 200 mg in PM   memantine 10 MG tablet Commonly known as: NAMENDA TAKE 1 TABLET BY MOUTH TWICE DAILY FOR MEMORY LOSS   pantoprazole  40 MG tablet Commonly known as:  PROTONIX  Take 40 mg by mouth at bedtime.   polyethylene glycol powder 17 GM/SCOOP powder Commonly known as: GLYCOLAX /MIRALAX  Take 17 g by mouth 2 (two) times daily.   simvastatin  20 MG tablet Commonly known as: ZOCOR  Take 20 mg by mouth at bedtime.   vitamin B-12 500 MCG tablet Commonly known as: CYANOCOBALAMIN  Take 500 mcg by mouth daily.   Xarelto  20 MG Tabs tablet Generic drug: rivaroxaban  Take 20 mg by mouth daily with supper.        Follow-up Information     Paraschos, Alexander, MD. Go in 1 week(s).   Specialty: Cardiology Why: Appointment scheduled for echocardiogram 05/14/2024 at 1 PM Follow up appointment with Dr. Ammon scheduled on 05/16/2024 at 10 AM Contact information: [address] [facility] [place] [address] [phone]                Discharge Exam: Fredricka Weights   05/09/24 2142  Weight: 93.2 kg   General: Well appearing elderly male, well nourished, in no acute distress. HEENT: Normocephalic and atraumatic. Neck: No JVD.   Lungs: Normal respiratory effort on room air. Clear bilaterally to auscultation. No wheezes, crackles, rhonchi.  Heart: HRRR. Normal S1 and S2 without gallops or murmurs.  Abdomen: Non-distended appearing.  Msk: Normal strength and tone for age. Extremities: Warm and well perfused. No clubbing, cyanosis, edema.  Neuro: Alert and oriented X 3. Psych: Answers questions appropriately  Condition at discharge: good  The results of significant diagnostics from this hospitalization (including imaging, microbiology, ancillary and laboratory) are listed below for reference.   Imaging Studies: PERIPHERAL VASCULAR CATHETERIZATION Result Date: 04/26/2024 See surgical note for result.  PERIPHERAL VASCULAR CATHETERIZATION Result Date: 04/19/2024 See surgical note for result.  DG Chest Port 1 View Result Date: 04/18/2024 CLINICAL DATA:  Respiratory failure. EXAM: PORTABLE CHEST 1 VIEW  COMPARISON:  04/17/2024 FINDINGS: Stable asymmetric elevation right hemidiaphragm with right base atelectasis/infiltrate and probable small right pleural effusion. There is pulmonary vascular congestion without overt pulmonary edema. Cardiopericardial silhouette is at upper limits of normal for size. No acute bony abnormality. Telemetry leads overlie the chest. IMPRESSION: 1. Stable asymmetric elevation right hemidiaphragm with right base atelectasis/infiltrate and probable small right pleural effusion. 2. Pulmonary vascular congestion without overt pulmonary edema. Electronically Signed   By: Camellia Candle M.D.   On: 04/18/2024 08:58   CT ABDOMEN PELVIS WO CONTRAST Result Date: 04/17/2024 CLINICAL DATA:  Ileus or obstruction. EXAM: CT ABDOMEN AND PELVIS WITHOUT CONTRAST TECHNIQUE: Multidetector CT imaging of the abdomen and pelvis was performed following the standard protocol without IV contrast. RADIATION DOSE REDUCTION: This exam was performed according to the departmental dose-optimization program which includes automated exposure control, adjustment of the mA and/or kV according to patient size and/or use of iterative reconstruction technique. COMPARISON:  CT dated 04/15/2024. FINDINGS: Evaluation of this exam is limited in the absence of intravenous contrast. Lower chest: There is consolidation of the majority of the visualized right middle and right lower lobes. Debris noted in the right mainstem bronchus and  right middle and right lower lobes. There is left lung base subpleural atelectasis. There is coronary vascular calcification. No intra-abdominal free air.  Small subhepatic fluid. Hepatobiliary: The liver is unremarkable. Vicarious excretion of contrast from prior study noted in the gallbladder. There is a stone in the cystic duct. No pericholecystic fluid or evidence of acute cholecystitis by CT. Pancreas: The pancreas is atrophic.  No active inflammatory changes. Spleen: Small scattered calcified  splenic granuloma. Adrenals/Urinary Tract: The adrenal glands are unremarkable. Moderately atrophic kidneys. Small renal cysts and additional subcentimeter hypodense lesions which are too small to characterize. A 2 cm high attenuating lesion from the superior pole of the right kidney is not characterized but may represent a complex/proteinaceous cyst. This is similar to prior CT. Further evaluation with ultrasound on a nonemergent/outpatient basis recommended. There is no hydronephrosis on either side. Residual contrast noted in the renal collecting systems bilaterally as well as in the urinary bladder. The visualized ureters and urinary bladder appear unremarkable. Stomach/Bowel: There is diffuse colonic diverticulosis. No active inflammation. There is diffuse dilatation of small-bowel loops measuring up to 3.8 cm. There is no discrete transition. A gradual transition noted in the right lower quadrant. Findings favor to represent an ileus and less likely a partial small bowel obstruction. Small-bowel series may provide better evaluation. The appendix is normal. Vascular/Lymphatic: An aorto bi iliac endovascular stent graft repair noted. The aorta is tortuous. There is a 3.7 cm aneurysmal dilatation of the infrarenal abdominal aorta. Bilateral common iliac aneurysms measure up to 4 cm on the left. The IVC is unremarkable. No portal venous gas. There is no adenopathy. Reproductive: The prostate is grossly unremarkable. Other: None Musculoskeletal: Osteopenia with degenerative changes of the spine and hips. No acute osseous pathology. IMPRESSION: 1. Diffuse dilatation of small-bowel loops without discrete transition. Findings favored to represent an ileus and less likely a partial small bowel obstruction. Small-bowel series may provide better evaluation. 2. Colonic diverticulosis. Normal appendix. 3. Cholelithiasis. 4. Aneurysmal dilatation of the abdominal aorta measure up to 3.7 cm. An aorto bi iliac endovascular  stent graft repair is seen. Recommend follow-up ultrasound every 3 years. (Ref.: J Vasc Surg. 2018; 67:2-77 and J Am Coll Radiol 2013;10(10):789-794.) 5. Debris in the bronchus intermedius with probable postobstructive atelectasis or pneumonia in the right middle and right lower lobes. Follow-up to resolution recommended. Electronically Signed   By: Vanetta Chou M.D.   On: 04/17/2024 18:37   DG Abd 1 View Result Date: 04/17/2024 CLINICAL DATA:  Abdominal pain. EXAM: ABDOMEN - 1 VIEW COMPARISON:  September 16, 2013. FINDINGS: Mildly dilated small bowel loops are noted without colonic dilatation. Mild amount of stool seen throughout the colon. Status post stent graft repair of abdominal aorta. IMPRESSION: Mild small bowel dilatation is noted concerning for distal small bowel obstruction or possibly ileus. Electronically Signed   By: Lynwood Landy Raddle M.D.   On: 04/17/2024 14:52   DG Chest Port 1 View Result Date: 04/17/2024 CLINICAL DATA:  Respiratory failure with hypoxia. EXAM: PORTABLE CHEST 1 VIEW COMPARISON:  April 15, 2024. FINDINGS: Stable cardiomediastinal silhouette. Mild central pulmonary vascular congestion is noted. Elevated right hemidiaphragm is noted with minimal right basilar subsegmental atelectasis. Bony thorax is unremarkable. IMPRESSION: Mild central pulmonary vascular congestion is noted. Elevated right hemidiaphragm with minimal right basilar subsegmental atelectasis. Electronically Signed   By: Lynwood Landy Raddle M.D.   On: 04/17/2024 14:50   CT Angio Chest/Abd/Pel for Dissection W and/or Wo Contrast Result Date: 04/15/2024 CLINICAL DATA:  Coughing up blood EXAM: CT ANGIOGRAPHY CHEST, ABDOMEN AND PELVIS TECHNIQUE: Non-contrast CT of the chest was initially obtained. Multidetector CT imaging through the chest, abdomen and pelvis was performed using the standard protocol during bolus administration of intravenous contrast. Multiplanar reconstructed images and MIPs were obtained and reviewed  to evaluate the vascular anatomy. RADIATION DOSE REDUCTION: This exam was performed according to the departmental dose-optimization program which includes automated exposure control, adjustment of the mA and/or kV according to patient size and/or use of iterative reconstruction technique. CONTRAST:  80mL OMNIPAQUE  IOHEXOL  350 MG/ML SOLN COMPARISON:  CT 10/20/2022 FINDINGS: CTA CHEST FINDINGS Cardiovascular: Non contrasted images of the chest demonstrate no acute intramural hematoma. Negative for aortic dissection. Focal outpouching measuring 14 mm at the distal arch either representing prominent ductus diverticulum or aneurysm, but sludge least stable size compared with noncontrast CT from 2015. No extravasation identified within the chest. Coronary vascular calcification. Normal cardiac size. No pericardial effusion. Mediastinum/Nodes: Patent trachea. No thyroid mass. No suspicious lymph nodes. Esophagus within normal limits. Calcified nodes consistent with prior granulomatous disease. Lungs/Pleura: No pleural effusion or pneumothorax. Patchy mild peribronchovascular ground-glass disease. No consolidation. Musculoskeletal: Sternum appears intact. No acute osseous abnormality. Review of the MIP images confirms the above findings. CTA ABDOMEN AND PELVIS FINDINGS VASCULAR Aorta: Aortic atherosclerosis. Status post infrarenal abdominal aortoiliac stent graft. The proximal end of the stent is just below the level of the renal arteries. Aneurysmal dilatation of the abdominal aorta at the level of the renal arteries, slightly superior to the proximal end of the stent, this measures 4.6 x 3.7 cm on axial series 6, image 180, previously 4.2 by 3.2 cm. Saccular appearing component of the aneurysm at the level of the stent, coronal series 11, image 74, this appears increased in size compared to the non contrasted exam from January and there may be some soft tissue thickening along the saccular portion of the aneurysm on  coronal series 11, image 73. No dissection is seen. 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 Possible small 5 mm aneurysm at the origin of the celiac artery, series 12, image 108. SMA: Mild-to-moderate stenosis of the proximal SMA just distal to the origin, series 12, image 111. Distal vessel patency without dissection. Renals: Accessory upper pole right renal artery with severe stenosis at the origin, series 11, image 80. 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 String like flow at the proximal right renal artery, series 11 image 75, consistent with severe stenosis over a 12 mm length. Small accessory upper pole renal artery on the left with suspected severe stenosis at the origin. Dominant left renal artery with severe stenosis at the origin, series 6, image 172 and coronal series 11 image 74 and 75. IMA: Poorly visible, small vessel supplying the perirectal is traceable to the iliac stent supplying the left iliac vessel. 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 Aneurysmal dilatation of the right common iliac artery at the distal end of the stent measuring up to 2.8 cm on series 11, image 73. Multiple small aneurysms of the proximal internal and iliac vessels. No high-grade stenosis or occlusive disease of the distal external iliac vessels. Veins: Suboptimally assessed Review of the MIP images confirms the above findings. NON-VASCULAR Hepatobiliary: Small gallstone. No biliary dilatation. No focal hepatic abnormality. Pancreas: Unremarkable. No pancreatic ductal dilatation or surrounding inflammatory  changes. Spleen: Multiple calcified granuloma Adrenals/Urinary Tract: Adrenal glands are within normal limits. Kidneys show no hydronephrosis. Multiple renal cysts for which no imaging follow-up is recommended. Slightly thick-walled  urinary bladder. Indeterminate slightly dense exophytic lesion off the upper pole right kidney measuring 17 mm, series 6, image 168. Stomach/Bowel: The stomach is non enlarged. There is no dilated small bowel. No acute bowel wall thickening. Negative appendix. Diverticular disease of the colon. Lymphatic: No suspicious lymph nodes. Reproductive: Negative prostate Other: Negative for pelvic effusion or free air. Previously noted bilateral inguinal hernias are resolved/repair. Musculoskeletal: No acute or suspicious osseous abnormality. Review of the MIP images confirms the above findings. IMPRESSION: 1. Negative for acute aortic dissection or intramural hematoma. No extravasation identified within the chest, abdomen, or pelvis. 2. Status post infrarenal abdominal aortoiliac stent graft. Aneurysmal dilatation of the abdominal aorta at the level of the renal arteries, slightly superior to the proximal end of the stent, this measures 4.6 x 3.7 cm, previously 4.2 x 3.2 cm. Saccular appearing component of the aneurysm at the level of the proximal stent appears increased in size compared to the non contrasted exam from January and there may be some soft tissue thickening and stranding along the inferior aspect of the saccular part of the aneurysm raising concern for possible leakage or impending rupture, vascular surgery consultation is recommended. 3. 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 Aneurysmal dilatation of the right common iliac artery at the distal end of the stent measuring up to 2.8 cm. Multiple small aneurysms of the proximal internal and iliac vessels. 4. 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 There are small accessory upper pole renal arteries bilaterally with suspected severe stenosis at the origin of these vessels. 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 5. Moderate  to severe focal stenosis at the origin of the celiac artery. Possible small 5 mm aneurysm at the origin of the celiac artery. 6. 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 Patchy mild peribronchovascular ground-glass disease within the lungs, suggestive of respiratory infection. No consolidative pneumonia. 8. Cholelithiasis. Diverticular disease of the colon. 9. 16 mm indeterminate exophytic lesion off the upper pole right kidney, could initially correlate with ultrasound 10. Slightly thick-walled urinary bladder, correlate for cystitis. 11. Interval bilateral inguinal hernia repair. 12. Aortic atherosclerosis. Aortic Atherosclerosis (ICD10-I70.0). Electronically Signed   By: Luke Bun M.D.   On: 04/15/2024 19:47   DG Chest 2 View Result Date: 04/15/2024 CLINICAL DATA:  Hemoptysis since last night, worsening today. Increased lower extremity swelling for 1 week. EXAM: CHEST - 2 VIEW COMPARISON:  Radiographs 08/26/2013 and 08/24/2013. Chest CT 04/01/2014. FINDINGS: The lateral view was repeated. The heart size and mediastinal contours are normal. 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 The lungs appear clear. No pleural effusion or pneumothorax. Calcified splenic granulomas are noted. Possible old rib fractures bilaterally without acute osseous abnormality. IMPRESSION: No radiographic evidence of active cardiopulmonary process. If the patient has persistent unexplained hemoptysis, follow-up CT should be considered. Electronically Signed   By: Elsie Perone M.D.   On: 04/15/2024 15:17    Microbiology: Results for orders placed or performed during the hospital encounter of 04/15/24  Blood culture (routine x 2)     Status: None   Collection Time: 04/15/24  8:55 PM   Specimen: BLOOD  Result Value Ref Range Status   Specimen  Description BLOOD BLOOD RIGHT ARM  Final   Special Requests   Final    BOTTLES DRAWN AEROBIC AND ANAEROBIC Blood Culture adequate volume    Culture   Final    NO GROWTH 5 DAYS Performed at [facility], [address] [place], [place] [address]    Report Status 04/20/2024 FINAL  Final  Blood culture (routine x 2)     Status: None   Collection Time: 04/15/24  8:55 PM   Specimen: BLOOD  Result Value Ref Range Status   Specimen Description BLOOD BLOOD LEFT ARM  Final   Special Requests   Final    BOTTLES DRAWN AEROBIC AND ANAEROBIC Blood Culture results may not be optimal due to an inadequate volume of blood received in culture bottles   Culture   Final    NO GROWTH 5 DAYS Performed at [facility], [address] [place], [place] [address]    Report Status 04/20/2024 FINAL  Final  MRSA Next Gen by PCR, Nasal     Status: None   Collection Time: 04/15/24 10:01 PM   Specimen: Nasal Mucosa; Nasal Swab  Result Value Ref Range Status   MRSA by PCR Next Gen NOT DETECTED NOT DETECTED Final    Comment: (NOTE) The GeneXpert MRSA Assay (FDA approved for NASAL specimens only), is one component of a comprehensive MRSA colonization surveillance program. It is not intended to diagnose MRSA infection nor to guide or monitor treatment for MRSA infections. Test performance is not FDA approved in patients less than 54 years old. Performed at [facility], [address] [place], [place] [address]   Culture, Respiratory w Gram Stain     Status: None   Collection Time: 04/16/24 11:42 AM   Specimen: SPU; Respiratory  Result Value Ref Range Status   Specimen Description   Final    SPUTUM Performed at [facility], [address], [place], [place] [address]    Special Requests   Final     EXPSU Performed at [facility], [address] [place], [place] [address]    Gram Stain   Final    ABUNDANT SQUAMOUS EPITHELIAL CELLS PRESENT MODERATE GRAM POSITIVE COCCI MODERATE GRAM NEGATIVE RODS    Culture   Final    Normal respiratory flora-no Staph aureus or Pseudomonas  seen Performed at [facility], [address], [place], [place] [address]    Report Status 04/18/2024 FINAL  Final  Respiratory (~20 pathogens) panel by PCR     Status: None   Collection Time: 04/16/24 11:42 AM   Specimen: Expectorated Sputum; Respiratory  Result Value Ref Range Status   Adenovirus NOT DETECTED NOT DETECTED Final   Coronavirus 229E NOT DETECTED NOT DETECTED Final    Comment: (NOTE) The Coronavirus on the Respiratory Panel, DOES NOT test for the novel  Coronavirus (2019 nCoV)    Coronavirus HKU1 NOT DETECTED NOT DETECTED Final   Coronavirus NL63 NOT DETECTED NOT DETECTED Final   Coronavirus OC43 NOT DETECTED NOT DETECTED Final   Metapneumovirus NOT DETECTED NOT DETECTED Final   Rhinovirus / Enterovirus NOT DETECTED NOT DETECTED Final   Influenza A NOT DETECTED NOT DETECTED Final   Influenza B NOT DETECTED NOT DETECTED Final   Parainfluenza Virus 1 NOT DETECTED NOT DETECTED Final   Parainfluenza Virus 2 NOT DETECTED NOT DETECTED Final   Parainfluenza Virus 3 NOT DETECTED NOT DETECTED Final   Parainfluenza Virus 4 NOT DETECTED NOT DETECTED Final  Respiratory Syncytial Virus NOT DETECTED NOT DETECTED Final   Bordetella pertussis NOT DETECTED NOT DETECTED Final   Bordetella Parapertussis NOT DETECTED NOT DETECTED Final   Chlamydophila pneumoniae NOT DETECTED NOT DETECTED Final   Mycoplasma pneumoniae NOT DETECTED NOT DETECTED Final    Comment: Performed at [facility], [address] [place], [place] [address]    Labs: CBC: Recent Labs  Lab 05/08/24 1216 05/09/24 0350 05/10/24 0412  WBC 11.2* 8.7 8.4  NEUTROABS 7.9*  --   --   HGB 9.3* 8.4* 8.6*  HCT 29.2* 26.4* 26.9*  MCV 97.7 96.4 96.8  PLT 337 290 255   Basic Metabolic Panel: Recent Labs  Lab 05/08/24 1216 05/09/24 0350 05/10/24 0412  NA 139 139 140  K 3.6 3.8 3.7  CL 109 113* 113*  CO2 18* 19* 21*  GLUCOSE 135* 90 90  BUN 53* 51* 42*  CREATININE 3.89* 3.51* 3.08*  CALCIUM 9.0  8.6* 8.7*  MG 1.9  --   --    Liver Function Tests: Recent Labs  Lab 05/08/24 1216 05/09/24 0350  AST 29 48*  ALT 19 25  ALKPHOS 71 126  BILITOT 1.4* 1.3*  PROT 6.8 6.3*  ALBUMIN  3.1* 2.9*   CBG: No results for input(s): GLUCAP in the last 168 hours.  Discharge time spent:  36 minutes.  Signed: Drue ONEIDA Potter, MD [facility] 05/10/2024

## 2024-05-10 NOTE — Progress Notes (Signed)
 [facility] CLINIC CARDIOLOGY PROGRESS NOTE       Patient ID: Nathan Rivera MRN: [id number] DOB/AGE: 10-07-1945 78 y.o.  Admit date: 05/08/2024 Referring Physician Dr. Roann Primary Physician Feldpausch, Cheryl BRAVO, MD Primary Cardiologist None Reason for Consultation Symptomatic bradycardia  HPI: Nathan Rivera is a 78 y.o. male  with a past medical history of hypertension, PAD, AAA rupture s/p repair 2 weeks ago (hospitalization 07/21 - 08/05), dementia, CKD who presented to the ED on 05/08/2024 for bradycardia, hypotension, and generalized weakness sent from nephrology outpatient due to SBP 80s and HR 30s. Patient denies hx bradycardia. Denies lightheadedness, chest pain, SOB, palpitations or syncope. EKG in ED with sinus bradycardia, rate 40 bpm. Cardiology was consulted for further evaluation of possible symptomatic bradycardia.  Interval History: -Patient seen and examined this AM and laying comfortably in hospital bed with family at bedside. Patient states he feels good overall and continues to deny any chest pain, palpitations, SOB, lightheadedness/dizziness. -Patients BP and HR stable this AM.  Per telemetry remains in sinus rhythm, rate 50s-60s. No slow rates overnight. -Patient remains on room air with stable SpO2.  -Cr downtrending today.   Review of systems complete and found to be negative unless listed above    Past Medical History:  Diagnosis Date   Alzheimer dementia [facility])    Aortic aneurysm (HCC)    Arthritis    knees are the biggest problem   Cancer (HCC) 2012   bladder   CKD (chronic kidney disease), stage III (HCC) 2014   relative to infection, septic- renal calculi   Degenerative disc disease    Degenerative joint disease    GERD (gastroesophageal reflux disease)    History of kidney stones    Hypercholesterolemia    Hypertension    no meds currently   Impaired glucose tolerance    Muscle fatigue    muscle fatigue relative to sepsis experience     Peripheral vascular disease (HCC)    Pneumonia    Raynaud's disease    Sepsis (HCC) 07/2013   Stroke (HCC)    Thromboembolism of left popilteal vein 2015   pt. unsure which leg, treating with Xarelto    UTI (urinary tract infection) 08/17/2013   Lt nephrostomy tube and stent placement    Past Surgical History:  Procedure Laterality Date   ABDOMINAL AORTIC ANEURYSM REPAIR  2011   CATARACT EXTRACTION W/PHACO Left 02/15/2021   Procedure: CATARACT EXTRACTION PHACO AND INTRAOCULAR LENS PLACEMENT (IOC) LEFT 5.59 00:44.0;  Surgeon: Myrna Adine Anes, MD;  Location: [facility];  Service: Ophthalmology;  Laterality: Left;   CATARACT EXTRACTION W/PHACO Right 03/08/2021   Procedure: CATARACT EXTRACTION PHACO AND INTRAOCULAR LENS PLACEMENT (IOC) RIGHT 1.81 00:22.7;  Surgeon: Myrna Adine Anes, MD;  Location: [facility];  Service: Ophthalmology;  Laterality: Right;   ENDOVASCULAR REPAIR/STENT GRAFT N/A 04/26/2024   Procedure: ENDOVASCULAR REPAIR/STENT GRAFT;  Surgeon: Jama Cordella MATSU, MD;  Location: [facility] INVASIVE CV LAB;  Service: Cardiovascular;  Laterality: N/A;   ESOPHAGOGASTRODUODENOSCOPY N/A 04/16/2024   Procedure: EGD (ESOPHAGOGASTRODUODENOSCOPY);  Surgeon: Jinny Carmine, MD;  Location: [facility] ENDOSCOPY;  Service: Endoscopy;  Laterality: N/A;   HERNIA REPAIR     63mo old   INSERTION OF MESH N/A 10/24/2014   Procedure: INSERTION OF MESH;  Surgeon: Deward Null III, MD;  Location: [facility] OR;  Service: General;  Laterality: N/A;   LAPAROTOMY N/A 08/24/2013   Procedure: EXPLORATORY LAPAROTOMY;  Surgeon: Deward GORMAN Null DOUGLAS, MD;  Location: [facility] OR;  Service:  General;  Laterality: N/A;  Gram patch closure   litrotripsy  09/24/2013   NEPHROSTOMY  Nov. 2014   nephrostomy removal  Dec. 2014   PERIPHERALLY INSERTED CENTRAL CATHETER INSERTION     RENAL ANGIOGRAPHY Bilateral 04/19/2024   Procedure: RENAL ANGIOGRAPHY;  Surgeon: Marea Selinda RAMAN, MD;  Location: [facility] INVASIVE CV LAB;  Service: Cardiovascular;   Laterality: Bilateral;   TONSILLECTOMY     UMBILICAL HERNIA REPAIR     50 yr ago   VENTRAL HERNIA REPAIR N/A 10/24/2014   Procedure: LAPAROSCOPIC ASSISTED VENTRAL HERNIA REPAIR WITH MESH;  Surgeon: Deward Null III, MD;  Location: [facility] OR;  Service: General;  Laterality: N/A;   XI ROBOTIC ASSISTED INGUINAL HERNIA REPAIR WITH MESH Bilateral 11/17/2022   Procedure: XI ROBOTIC ASSISTED INGUINAL HERNIA REPAIR WITH MESH-Bilateral;  Surgeon: Desiderio Schanz, MD;  Location: [facility] ORS;  Service: General;  Laterality: Bilateral;    Medications Prior to Admission  Medication Sig Dispense Refill Last Dose/Taking   acetaminophen  (TYLENOL ) 325 MG tablet Take 325 mg by mouth every 6 (six) hours as needed for mild pain.   05/08/2024   amLODipine  (NORVASC ) 10 MG tablet Take 1 tablet (10 mg total) by mouth daily. 30 tablet 3 05/08/2024   aspirin  EC 81 MG tablet Take 1 tablet (81 mg total) by mouth daily. Swallow whole. 30 tablet 12 05/08/2024   bisacodyl  (DULCOLAX) 10 MG suppository Place 1 suppository (10 mg total) rectally daily as needed for severe constipation. 12 suppository 0 Unknown   bisacodyl  (DULCOLAX) 5 MG EC tablet Take 2 tablets (10 mg total) by mouth at bedtime. 30 tablet 0 05/07/2024   cholecalciferol (VITAMIN D) 1000 UNITS tablet Take 1,000 Units by mouth daily.   05/08/2024   donepezil  (ARICEPT ) 10 MG tablet Take 10 mg by mouth at bedtime.   05/07/2024   FOLIC ACID PO Take by mouth.   05/07/2024   gabapentin  (NEURONTIN ) 100 MG capsule 200 mg 2 (two) times daily. 200 mg in am and 200 mg in PM   05/08/2024   hydrALAZINE  (APRESOLINE ) 100 MG tablet Take 1 tablet (100 mg total) by mouth every 6 (six) hours. 90 tablet 2 05/08/2024   memantine (NAMENDA) 10 MG tablet TAKE 1 TABLET BY MOUTH TWICE DAILY FOR MEMORY LOSS   05/08/2024   metoprolol  tartrate (LOPRESSOR ) 25 MG tablet Take 1 tablet (25 mg total) by mouth 2 (two) times daily. (Patient taking differently: Take 12.5 mg by mouth 2 (two) times daily.) 60 tablet 2  05/08/2024   pantoprazole  (PROTONIX ) 40 MG tablet Take 40 mg by mouth at bedtime.    05/07/2024   polyethylene glycol powder (GLYCOLAX /MIRALAX ) 17 GM/SCOOP powder Take 17 g by mouth 2 (two) times daily. 238 g 0 Unknown   simvastatin  (ZOCOR ) 20 MG tablet Take 20 mg by mouth at bedtime.   05/07/2024   vitamin B-12 (CYANOCOBALAMIN ) 500 MCG tablet Take 500 mcg by mouth daily.   05/08/2024   XARELTO  20 MG TABS tablet Take 20 mg by mouth daily with supper.   05/07/2024   Social History   Socioeconomic History   Marital status: Married    Spouse name: Nathan Rivera   Number of children: Not on file   Years of education: Not on file   Highest education level: Not on file  Occupational History   Not on file  Tobacco Use   Smoking status: Every Day    Current packs/day: 1.00    Average packs/day: 1 pack/day for 40.0 years (40.0 ttl pk-yrs)  Types: Cigarettes    Passive exposure: Past   Smokeless tobacco: Never   Tobacco comments:    since age 61  Vaping Use   Vaping status: Never Used  Substance and Sexual Activity   Alcohol use: Yes    Alcohol/week: 3.0 standard drinks of alcohol    Types: 3 Glasses of wine per week   Drug use: No   Sexual activity: Not on file  Other Topics Concern   Not on file  Social History Narrative   Not on file   Social Drivers of Health   Financial Resource Strain: Low Risk  (07/19/2023)   Received from [facility]   Overall Financial Resource Strain (CARDIA)    Difficulty of Paying Living Expenses: Not hard at all  Food Insecurity: No Food Insecurity (05/09/2024)   Hunger Vital Sign    Worried About Running Out of Food in the Last Year: Never true    Ran Out of Food in the Last Year: Never true  Transportation Needs: No Transportation Needs (05/09/2024)   PRAPARE - Administrator, Civil Service (Medical): No    Lack of Transportation (Non-Medical): No  Physical Activity: Not on file  Stress: Not on file  Social Connections:  Moderately Isolated (05/09/2024)   Social Connection and Isolation Panel    Frequency of Communication with Friends and Family: Once a week    Frequency of Social Gatherings with Friends and Family: Twice a week    Attends Religious Services: Patient declined    Active Member of Clubs or Organizations: No    Attends Banker Meetings: Never    Marital Status: Married  Catering manager Violence: Not At Risk (05/09/2024)   Humiliation, Afraid, Rape, and Kick questionnaire    Fear of Current or Ex-Partner: No    Emotionally Abused: No    Physically Abused: No    Sexually Abused: No    Family History  Problem Relation Age of Onset   CVA Father    Atrial fibrillation Sister      Vitals:   05/09/24 2142 05/09/24 2332 05/10/24 0453 05/10/24 0847  BP: (!) 152/72 (!) 164/70 (!) 149/76 (!) 143/89  Pulse: 68 69 65 71  Resp: 17 16 18 18   Temp: 97.8 F (36.6 C) 98.5 F (36.9 C) (!) 97.5 F (36.4 C) 98.8 F (37.1 C)  TempSrc: Oral Oral    SpO2:  97% 97% 96%  Weight: 93.2 kg     Height: 5' 9 (1.753 m)       PHYSICAL EXAM General: Well appearing elderly male, well nourished, in no acute distress. HEENT: Normocephalic and atraumatic. Neck: No JVD.   Lungs: Normal respiratory effort on room air. Clear bilaterally to auscultation. No wheezes, crackles, rhonchi.  Heart: HRRR. Normal S1 and S2 without gallops or murmurs.  Abdomen: Non-distended appearing.  Msk: Normal strength and tone for age. Extremities: Warm and well perfused. No clubbing, cyanosis, edema.  Neuro: Alert and oriented X 3. Psych: Answers questions appropriately.   Labs: Basic Metabolic Panel: Recent Labs    05/08/24 1216 05/09/24 0350 05/10/24 0412  NA 139 139 140  K 3.6 3.8 3.7  CL 109 113* 113*  CO2 18* 19* 21*  GLUCOSE 135* 90 90  BUN 53* 51* 42*  CREATININE 3.89* 3.51* 3.08*  CALCIUM 9.0 8.6* 8.7*  MG 1.9  --   --    Liver Function Tests: Recent Labs    05/08/24 1216 05/09/24 0350  AST 29 48*  ALT 19 25  ALKPHOS 71 126  BILITOT 1.4* 1.3*  PROT 6.8 6.3*  ALBUMIN  3.1* 2.9*   No results for input(s): LIPASE, AMYLASE in the last 72 hours. CBC: Recent Labs    05/08/24 1216 05/09/24 0350 05/10/24 0412  WBC 11.2* 8.7 8.4  NEUTROABS 7.9*  --   --   HGB 9.3* 8.4* 8.6*  HCT 29.2* 26.4* 26.9*  MCV 97.7 96.4 96.8  PLT 337 290 255   Cardiac Enzymes: Recent Labs    05/08/24 1216 05/08/24 1402  TROPONINIHS 11 10   BNP: No results for input(s): BNP in the last 72 hours. D-Dimer: No results for input(s): DDIMER in the last 72 hours. Hemoglobin A1C: No results for input(s): HGBA1C in the last 72 hours. Fasting Lipid Panel: No results for input(s): CHOL, HDL, LDLCALC, TRIG, CHOLHDL, LDLDIRECT in the last 72 hours. Thyroid Function Tests: Recent Labs    05/08/24 1216  TSH 3.426   Anemia Panel: No results for input(s): VITAMINB12, FOLATE, FERRITIN, TIBC, IRON, RETICCTPCT in the last 72 hours.   Radiology: PERIPHERAL VASCULAR CATHETERIZATION Result Date: 04/26/2024 See surgical note for result.  PERIPHERAL VASCULAR CATHETERIZATION Result Date: 04/19/2024 See surgical note for result.  DG Chest Port 1 View Result Date: 04/18/2024 CLINICAL DATA:  Respiratory failure. EXAM: PORTABLE CHEST 1 VIEW COMPARISON:  04/17/2024 FINDINGS: Stable asymmetric elevation right hemidiaphragm with right base atelectasis/infiltrate and probable small right pleural effusion. There is pulmonary vascular congestion without overt pulmonary edema. Cardiopericardial silhouette is at upper limits of normal for size. No acute bony abnormality. Telemetry leads overlie the chest. IMPRESSION: 1. Stable asymmetric elevation right hemidiaphragm with right base atelectasis/infiltrate and probable small right pleural effusion. 2. Pulmonary vascular congestion without overt pulmonary edema. Electronically Signed   By: Camellia Candle M.D.   On: 04/18/2024 08:58   CT  ABDOMEN PELVIS WO CONTRAST Result Date: 04/17/2024 CLINICAL DATA:  Ileus or obstruction. EXAM: CT ABDOMEN AND PELVIS WITHOUT CONTRAST TECHNIQUE: Multidetector CT imaging of the abdomen and pelvis was performed following the standard protocol without IV contrast. RADIATION DOSE REDUCTION: This exam was performed according to the departmental dose-optimization program which includes automated exposure control, adjustment of the mA and/or kV according to patient size and/or use of iterative reconstruction technique. COMPARISON:  CT dated 04/15/2024. FINDINGS: Evaluation of this exam is limited in the absence of intravenous contrast. Lower chest: There is consolidation of the majority of the visualized right middle and right lower lobes. Debris noted in the right mainstem bronchus and right middle and right lower lobes. There is left lung base subpleural atelectasis. There is coronary vascular calcification. No intra-abdominal free air.  Small subhepatic fluid. Hepatobiliary: The liver is unremarkable. Vicarious excretion of contrast from prior study noted in the gallbladder. There is a stone in the cystic duct. No pericholecystic fluid or evidence of acute cholecystitis by CT. Pancreas: The pancreas is atrophic.  No active inflammatory changes. Spleen: Small scattered calcified splenic granuloma. Adrenals/Urinary Tract: The adrenal glands are unremarkable. Moderately atrophic kidneys. Small renal cysts and additional subcentimeter hypodense lesions which are too small to characterize. A 2 cm high attenuating lesion from the superior pole of the right kidney is not characterized but may represent a complex/proteinaceous cyst. This is similar to prior CT. Further evaluation with ultrasound on a nonemergent/outpatient basis recommended. There is no hydronephrosis on either side. Residual contrast noted in the renal collecting systems bilaterally as well as in the urinary bladder. The visualized ureters and  urinary  bladder appear unremarkable. Stomach/Bowel: There is diffuse colonic diverticulosis. No active inflammation. There is diffuse dilatation of small-bowel loops measuring up to 3.8 cm. There is no discrete transition. A gradual transition noted in the right lower quadrant. Findings favor to represent an ileus and less likely a partial small bowel obstruction. Small-bowel series may provide better evaluation. The appendix is normal. Vascular/Lymphatic: An aorto bi iliac endovascular stent graft repair noted. The aorta is tortuous. There is a 3.7 cm aneurysmal dilatation of the infrarenal abdominal aorta. Bilateral common iliac aneurysms measure up to 4 cm on the left. The IVC is unremarkable. No portal venous gas. There is no adenopathy. Reproductive: The prostate is grossly unremarkable. Other: None Musculoskeletal: Osteopenia with degenerative changes of the spine and hips. No acute osseous pathology. IMPRESSION: 1. Diffuse dilatation of small-bowel loops without discrete transition. Findings favored to represent an ileus and less likely a partial small bowel obstruction. Small-bowel series may provide better evaluation. 2. Colonic diverticulosis. Normal appendix. 3. Cholelithiasis. 4. Aneurysmal dilatation of the abdominal aorta measure up to 3.7 cm. An aorto bi iliac endovascular stent graft repair is seen. Recommend follow-up ultrasound every 3 years. (Ref.: J Vasc Surg. 2018; 67:2-77 and J Am Coll Radiol 2013;10(10):789-794.) 5. Debris in the bronchus intermedius with probable postobstructive atelectasis or pneumonia in the right middle and right lower lobes. Follow-up to resolution recommended. Electronically Signed   By: Vanetta Chou M.D.   On: 04/17/2024 18:37   DG Abd 1 View Result Date: 04/17/2024 CLINICAL DATA:  Abdominal pain. EXAM: ABDOMEN - 1 VIEW COMPARISON:  September 16, 2013. FINDINGS: Mildly dilated small bowel loops are noted without colonic dilatation. Mild amount of stool seen throughout the  colon. Status post stent graft repair of abdominal aorta. IMPRESSION: Mild small bowel dilatation is noted concerning for distal small bowel obstruction or possibly ileus. Electronically Signed   By: Lynwood Landy Raddle M.D.   On: 04/17/2024 14:52   DG Chest Port 1 View Result Date: 04/17/2024 CLINICAL DATA:  Respiratory failure with hypoxia. EXAM: PORTABLE CHEST 1 VIEW COMPARISON:  April 15, 2024. FINDINGS: Stable cardiomediastinal silhouette. Mild central pulmonary vascular congestion is noted. Elevated right hemidiaphragm is noted with minimal right basilar subsegmental atelectasis. Bony thorax is unremarkable. IMPRESSION: Mild central pulmonary vascular congestion is noted. Elevated right hemidiaphragm with minimal right basilar subsegmental atelectasis. Electronically Signed   By: Lynwood Landy Raddle M.D.   On: 04/17/2024 14:50   CT Angio Chest/Abd/Pel for Dissection W and/or Wo Contrast Result Date: 04/15/2024 CLINICAL DATA:  Coughing up blood EXAM: CT ANGIOGRAPHY CHEST, ABDOMEN AND PELVIS TECHNIQUE: Non-contrast CT of the chest was initially obtained. Multidetector CT imaging through the chest, abdomen and pelvis was performed using the standard protocol during bolus administration of intravenous contrast. Multiplanar reconstructed images and MIPs were obtained and reviewed to evaluate the vascular anatomy. RADIATION DOSE REDUCTION: This exam was performed according to the departmental dose-optimization program which includes automated exposure control, adjustment of the mA and/or kV according to patient size and/or use of iterative reconstruction technique. CONTRAST:  80mL OMNIPAQUE  IOHEXOL  350 MG/ML SOLN COMPARISON:  CT 10/20/2022 FINDINGS: CTA CHEST FINDINGS Cardiovascular: Non contrasted images of the chest demonstrate no acute intramural hematoma. Negative for aortic dissection. Focal outpouching measuring 14 mm at the distal arch either representing prominent ductus diverticulum or aneurysm, but sludge  least stable size compared with noncontrast CT from 2015. No extravasation identified within the chest. Coronary vascular calcification. Normal cardiac size. No pericardial effusion. Mediastinum/Nodes:  Patent trachea. No thyroid mass. No suspicious lymph nodes. Esophagus within normal limits. Calcified nodes consistent with prior granulomatous disease. Lungs/Pleura: No pleural effusion or pneumothorax. Patchy mild peribronchovascular ground-glass disease. No consolidation. Musculoskeletal: Sternum appears intact. No acute osseous abnormality. Review of the MIP images confirms the above findings. CTA ABDOMEN AND PELVIS FINDINGS VASCULAR Aorta: Aortic atherosclerosis. Status post infrarenal abdominal aortoiliac stent graft. The proximal end of the stent is just below the level of the renal arteries. Aneurysmal dilatation of the abdominal aorta at the level of the renal arteries, slightly superior to the proximal end of the stent, this measures 4.6 x 3.7 cm on axial series 6, image 180, previously 4.2 by 3.2 cm. Saccular appearing component of the aneurysm at the level of the stent, coronal series 11, image 74, this appears increased in size compared to the non contrasted exam from January and there may be some soft tissue thickening along the saccular portion of the aneurysm on coronal series 11, image 73. No dissection is seen. 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 Possible small 5 mm aneurysm at the origin of the celiac artery, series 12, image 108. SMA: Mild-to-moderate stenosis of the proximal SMA just distal to the origin, series 12, image 111. Distal vessel patency without dissection. Renals: Accessory upper pole right renal artery with severe stenosis at the origin, series 11, image 80. 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 String like flow at the proximal right renal artery, series 11 image 75, consistent with severe  stenosis over a 12 mm length. Small accessory upper pole renal artery on the left with suspected severe stenosis at the origin. Dominant left renal artery with severe stenosis at the origin, series 6, image 172 and coronal series 11 image 74 and 75. IMA: Poorly visible, small vessel supplying the perirectal is traceable to the iliac stent supplying the left iliac vessel. 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 Aneurysmal dilatation of the right common iliac artery at the distal end of the stent measuring up to 2.8 cm on series 11, image 73. Multiple small aneurysms of the proximal internal and iliac vessels. No high-grade stenosis or occlusive disease of the distal external iliac vessels. Veins: Suboptimally assessed Review of the MIP images confirms the above findings. NON-VASCULAR Hepatobiliary: Small gallstone. No biliary dilatation. No focal hepatic abnormality. Pancreas: Unremarkable. No pancreatic ductal dilatation or surrounding inflammatory changes. Spleen: Multiple calcified granuloma Adrenals/Urinary Tract: Adrenal glands are within normal limits. Kidneys show no hydronephrosis. Multiple renal cysts for which no imaging follow-up is recommended. Slightly thick-walled urinary bladder. Indeterminate slightly dense exophytic lesion off the upper pole right kidney measuring 17 mm, series 6, image 168. Stomach/Bowel: The stomach is non enlarged. There is no dilated small bowel. No acute bowel wall thickening. Negative appendix. Diverticular disease of the colon. Lymphatic: No suspicious lymph nodes. Reproductive: Negative prostate Other: Negative for pelvic effusion or free air. Previously noted bilateral inguinal hernias are resolved/repair. Musculoskeletal: No acute or suspicious osseous abnormality. Review of the MIP images confirms the above findings. IMPRESSION: 1. Negative for acute aortic dissection or  intramural hematoma. No extravasation identified within the chest, abdomen, or pelvis. 2. Status post infrarenal abdominal aortoiliac stent graft. Aneurysmal dilatation of the abdominal aorta at the level of the renal arteries, slightly superior to the proximal end of the stent, this measures  4.6 x 3.7 cm, previously 4.2 x 3.2 cm. Saccular appearing component of the aneurysm at the level of the proximal stent appears increased in size compared to the non contrasted exam from January and there may be some soft tissue thickening and stranding along the inferior aspect of the saccular part of the aneurysm raising concern for possible leakage or impending rupture, vascular surgery consultation is recommended. 3. 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 Aneurysmal dilatation of the right common iliac artery at the distal end of the stent measuring up to 2.8 cm. Multiple small aneurysms of the proximal internal and iliac vessels. 4. 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 There are small accessory upper pole renal arteries bilaterally with suspected severe stenosis at the origin of these vessels. 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 5. 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 Possible small 5 mm aneurysm at the origin of the celiac artery. 6. 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 Patchy mild peribronchovascular ground-glass disease within the lungs, suggestive of respiratory infection. No consolidative pneumonia. 8. Cholelithiasis. Diverticular disease of the colon. 9. 16 mm indeterminate exophytic lesion off the upper pole right kidney, could initially correlate with ultrasound 10. Slightly thick-walled urinary bladder, correlate for cystitis. 11. Interval bilateral inguinal hernia repair. 12. Aortic atherosclerosis.  Aortic Atherosclerosis (ICD10-I70.0). Electronically Signed   By: Luke Bun M.D.   On: 04/15/2024 19:47   DG Chest 2 View Result Date: 04/15/2024 CLINICAL DATA:  Hemoptysis since last night, worsening today. Increased lower extremity swelling for 1 week. EXAM: CHEST - 2 VIEW COMPARISON:  Radiographs 08/26/2013 and 08/24/2013. Chest CT 04/01/2014. FINDINGS: The lateral view was repeated. The heart size and mediastinal contours are normal. 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 The lungs appear clear. No pleural effusion or pneumothorax. Calcified splenic granulomas are noted. Possible old rib fractures bilaterally without acute osseous abnormality. IMPRESSION: No radiographic evidence of active cardiopulmonary process. If the patient has persistent unexplained hemoptysis, follow-up CT should be considered. Electronically Signed   By: Elsie Perone M.D.   On: 04/15/2024 15:17    ECHO scheduled outpatient  TELEMETRY reviewed by me 05/10/2024: sinus rhythm, rate 60s  EKG reviewed by me: sinus bradycardia, rate 40 bpm  Data reviewed by me 05/10/2024: last 24h vitals tele labs imaging I/O hospitalist progress notes.  Principal Problem:   Bradycardia Active Problems:   PAD (peripheral artery disease) (HCC)   Hyperlipidemia   Hypotension, unspecified   Abdominal aneurysm (HCC)    ASSESSMENT AND PLAN:  ASER NYLUND is a 78 y.o. male  with a past medical history of hypertension, PAD, AAA rupture s/p repair 2 weeks ago (hospitalization 07/21 - 08/05), dementia, CKD who presented to the ED on 05/08/2024 for bradycardia, hypotension, and generalized weakness sent from nephrology outpatient due to SBP 80s and HR 30s. Patient denies hx bradycardia. Denies lightheadedness, chest pain, SOB, palpitations or syncope. EKG in ED with sinus bradycardia, rate 40 bpm. Cardiology was consulted for further evaluation of possible symptomatic bradycardia.  # Sinus bradycardia # Generalized  weakness # Fatigue # Deconditioning  EKG with sinus bradycardia rate 40 bpm. Tele with sinus bradycardia as low as 38 bpm early on during admission. HR has remained stable 40-60s and patient remains aSX. During PT on 08/14, HR remains in 60s with no  concerns. -Echo scheduled for 08/19 at 1PM in our clinic. -Continue to monitor telemetry closely. (No evidence of significant pauses or high grade AVB). -Avoid all AVN blockers. (Hold home metoprolol ) -Recommend adequate hydration  -Unclear if sxs of worsening weakness/fatigue since hospital discharge is related to deconditioning or symptomatic bradycardia. For now will continue to monitor telemetry and patients symptoms. Patient has remains aSX since admission.  -Plan to place 72-hour cardiac Holter monitor for further evaluation upon discharge.  -Continued discussions if PPM is indicated will continue at outpatient follow-up, patient's family is requesting clearance from primary nephrologist prior to this, since GFR has been around 15.  Hypertension -Continue home amlodipine  10 mg daily.   # AKI on CKD Improving on AM labs today. -Management per primary team. -Nephrology following, appreciate recommendations.  Cardiology will sign off. Please haiku with questions or re-engage if needed. Follow-up scheduled with Dr. Ammon on 08/21 at 10 AM.  This patient's plan of care was discussed and created with Dr. Ammon and he is in agreement.  Signed: Danita Bloch, PA-C  05/10/2024, 9:14 AM [facility]

## 2024-05-10 NOTE — Progress Notes (Signed)
 [facility]  ROUNDING NOTE   Subjective:   Nathan Rivera with past medical history of peripheral arterial disease, hypertension, abdominal aortic aneurysm, hyperlipidemia, bladder cancer, thrombocytopenia who was admitted for Bradycardia [R00.1] Symptomatic bradycardia [R00.1] Acute renal failure superimposed on chronic kidney disease, unspecified acute renal failure type, unspecified CKD stage (HCC) [N17.9, N18.9] .  Status post iliac artery repair with coil embolization by Dr. Jama 04/26/24, during previous admission.  Patient arrived to outpatient nephrology office for scheduled appointment and was found to be severely hypotensive, somnolent, and decreased oxygen saturations.   Patient seen sitting on side of bed Alert and oriented Wife and daughter at bedside Preparing to work with PT Room air  Objective:  Vital signs in last 24 hours:  Temp:  [97.5 F (36.4 C)-98.8 F (37.1 C)] 98.8 F (37.1 C) (08/15 0847) Pulse Rate:  [57-71] 71 (08/15 0847) Resp:  [13-18] 18 (08/15 0847) BP: (129-164)/(70-97) 143/89 (08/15 0847) SpO2:  [94 %-99 %] 96 % (08/15 0847) Weight:  [93.2 kg] 93.2 kg (08/14 2142)  Weight change:  Filed Weights   05/09/24 2142  Weight: 93.2 kg     Intake/Output: I/O last 3 completed shifts: In: 1019.8 [P.O.:200; I.V.:819.8] Out: 1100 [Urine:1100]   Intake/Output this shift:  Total I/O In: 613.8 [P.O.:360; I.V.:253.8] Out: 600 [Urine:600]  Physical Exam: General: No acute distress  Head: Normocephalic, atraumatic. Moist oral mucosal membranes  Neck: Supple  Lungs:  Clear, room air  Heart: Regular   Abdomen:  Soft, nontender, bowel sounds present  Extremities: no peripheral edema.  Neurologic: Awake, alert, following commands  Skin: No acute rash  Access: No hemodialysis access    Basic Metabolic Panel: Recent Labs  Lab 05/08/24 1216 05/09/24 0350 05/10/24 0412  NA 139 139 140  K 3.6 3.8 3.7  CL 109 113* 113*  CO2 18*  19* 21*  GLUCOSE 135* 90 90  BUN 53* 51* 42*  CREATININE 3.89* 3.51* 3.08*  CALCIUM 9.0 8.6* 8.7*  MG 1.9  --   --     Liver Function Tests: Recent Labs  Lab 05/08/24 1216 05/09/24 0350  AST 29 48*  ALT 19 25  ALKPHOS 71 126  BILITOT 1.4* 1.3*  PROT 6.8 6.3*  ALBUMIN  3.1* 2.9*   No results for input(s): LIPASE, AMYLASE in the last 168 hours. No results for input(s): AMMONIA in the last 168 hours.  CBC: Recent Labs  Lab 05/08/24 1216 05/09/24 0350 05/10/24 0412  WBC 11.2* 8.7 8.4  NEUTROABS 7.9*  --   --   HGB 9.3* 8.4* 8.6*  HCT 29.2* 26.4* 26.9*  MCV 97.7 96.4 96.8  PLT 337 290 255    Cardiac Enzymes: No results for input(s): CKTOTAL, CKMB, CKMBINDEX, TROPONINI in the last 168 hours.  BNP: Invalid input(s): POCBNP  CBG: No results for input(s): GLUCAP in the last 168 hours.   Microbiology: Results for orders placed or performed during the hospital encounter of 04/15/24  Blood culture (routine x 2)     Status: None   Collection Time: 04/15/24  8:55 PM   Specimen: BLOOD  Result Value Ref Range Status   Specimen Description BLOOD BLOOD RIGHT ARM  Final   Special Requests   Final    BOTTLES DRAWN AEROBIC AND ANAEROBIC Blood Culture adequate volume   Culture   Final    NO GROWTH 5 DAYS Performed at [facility], [address] [place], [place] [address]    Report Status 04/20/2024 FINAL  Final  Blood culture (routine x 2)     Status: None   Collection Time: 04/15/24  8:55 PM   Specimen: BLOOD  Result Value Ref Range Status   Specimen Description BLOOD BLOOD LEFT ARM  Final   Special Requests   Final    BOTTLES DRAWN AEROBIC AND ANAEROBIC Blood Culture results may not be optimal due to an inadequate volume of blood received in culture bottles   Culture   Final    NO GROWTH 5 DAYS Performed at [facility], [address], [place], [place] [address]    Report Status 04/20/2024 FINAL  Final  MRSA Next Gen by PCR,  Nasal     Status: None   Collection Time: 04/15/24 10:01 PM   Specimen: Nasal Mucosa; Nasal Swab  Result Value Ref Range Status   MRSA by PCR Next Gen NOT DETECTED NOT DETECTED Final    Comment: (NOTE) The GeneXpert MRSA Assay (FDA approved for NASAL specimens only), is one component of a comprehensive MRSA colonization surveillance program. It is not intended to diagnose MRSA infection nor to guide or monitor treatment for MRSA infections. Test performance is not FDA approved in patients less than 62 years old. Performed at [facility], [address] [place], [place] [address]   Culture, Respiratory w Gram Stain     Status: None   Collection Time: 04/16/24 11:42 AM   Specimen: SPU; Respiratory  Result Value Ref Range Status   Specimen Description   Final    SPUTUM Performed at [facility], [address] [place], [place] [address]    Special Requests   Final     EXPSU Performed at [facility], [address] [place], [place] [address]    Gram Stain   Final    ABUNDANT SQUAMOUS EPITHELIAL CELLS PRESENT MODERATE GRAM POSITIVE COCCI MODERATE GRAM NEGATIVE RODS    Culture   Final    Normal respiratory flora-no Staph aureus or Pseudomonas seen Performed at [facility], [address], [place], [place] [address]    Report Status 04/18/2024 FINAL  Final  Respiratory (~20 pathogens) panel by PCR     Status: None   Collection Time: 04/16/24 11:42 AM   Specimen: Expectorated Sputum; Respiratory  Result Value Ref Range Status   Adenovirus NOT DETECTED NOT DETECTED Final   Coronavirus 229E NOT DETECTED NOT DETECTED Final    Comment: (NOTE) The Coronavirus on the Respiratory Panel, DOES NOT test for the novel  Coronavirus (2019 nCoV)    Coronavirus HKU1 NOT DETECTED NOT DETECTED Final   Coronavirus NL63 NOT DETECTED NOT DETECTED Final   Coronavirus OC43 NOT DETECTED NOT DETECTED Final   Metapneumovirus NOT DETECTED NOT DETECTED Final    Rhinovirus / Enterovirus NOT DETECTED NOT DETECTED Final   Influenza A NOT DETECTED NOT DETECTED Final   Influenza B NOT DETECTED NOT DETECTED Final   Parainfluenza Virus 1 NOT DETECTED NOT DETECTED Final   Parainfluenza Virus 2 NOT DETECTED NOT DETECTED Final   Parainfluenza Virus 3 NOT DETECTED NOT DETECTED Final   Parainfluenza Virus 4 NOT DETECTED NOT DETECTED Final   Respiratory Syncytial Virus NOT DETECTED NOT DETECTED Final   Bordetella pertussis NOT DETECTED NOT DETECTED Final   Bordetella Parapertussis NOT DETECTED NOT DETECTED Final   Chlamydophila pneumoniae NOT DETECTED NOT DETECTED Final   Mycoplasma pneumoniae NOT DETECTED NOT DETECTED Final    Comment: Performed at [facility], [address], [place], [place] [address]  Coagulation Studies: Recent Labs    05/09/24 0350  LABPROT 20.9*  INR 1.7*      Urinalysis: No results for input(s): COLORURINE, LABSPEC, PHURINE, GLUCOSEU, HGBUR, BILIRUBINUR, KETONESUR, PROTEINUR, UROBILINOGEN, NITRITE, LEUKOCYTESUR in the last 72 hours.  Invalid input(s): APPERANCEUR    Imaging: No results found.     Medications:        amLODipine   10 mg Oral Daily   aspirin  EC  81 mg Oral Daily   cyanocobalamin   500 mcg Oral Daily   pantoprazole   40 mg Oral QHS   rivaroxaban   15 mg Oral Q supper   simvastatin   20 mg Oral QHS   acetaminophen  **OR** acetaminophen , ondansetron  **OR** ondansetron  (ZOFRAN ) IV, polyethylene glycol  Assessment/ Plan:  78 y.o. male with past medical history of peripheral arterial disease, hypertension, abdominal aortic aneurysm, hyperlipidemia, bladder cancer, thrombocytopenia who was admitted for Bradycardia [R00.1] Symptomatic bradycardia [R00.1] Acute renal failure superimposed on chronic kidney disease, unspecified acute renal failure type, unspecified CKD stage (HCC) [N17.9, N18.9] With  1.  Acute kidney injury/chronic kidney disease stage IIIb with proteinuria  -baseline creatinine 2.34 with EGFR 28 on 04/30/2024.  Acute kidney injury likely secondary to hypotension. Renal function continues to improve with IV hydration and adequate oral intake. Patient will follow up with Dr Dennise next week.  08/14 0701 - 08/15 0700 In: 1019.8 [P.O.:200; I.V.:819.8] Out: 1100 [Urine:1100] Lab Results  Component Value Date   CREATININE 3.08 (H) 05/10/2024   CREATININE 3.51 (H) 05/09/2024   CREATININE 3.89 (H) 05/08/2024    2.  Hypotension with history of hypertension.  Recent renal artery stent placement, likely resulting in 2 decreased blood pressures due to medications.  Medications currently held.  Blood pressure stable   3.  Acute metabolic acidosis.  Serum bicarb 18 on admission. Continues to correct, 21.   4. Anemia of chronic kidney disease Lab Results  Component Value Date   HGB 8.6 (L) 05/10/2024    Hemoglobin 8.6.  Will continue to monitor assess need for ESA.   LOS: 2 Treston Coker 8/15/20251:00 PM

## 2024-05-10 NOTE — Progress Notes (Signed)
 Patient's family has discharge needs that have not been addressed. Per family, patient needs home health, as patient has ADL needs at home. Patient has d/c order. Will hold d/c until these needs are addressed.   Lauraine Carpen, LCSW, notified and stated she will come to bedside to discuss needs with patient and family.

## 2024-05-10 NOTE — Plan of Care (Signed)

## 2024-05-10 NOTE — Plan of Care (Signed)

## 2024-05-10 NOTE — Plan of Care (Signed)
  Problem: Acute Rehab OT Goals (only OT should resolve) Goal: Pt. Will Transfer To Toilet Outcome: Adequate for Discharge Goal: Pt. Will Perform Toileting-Clothing Manipulation Outcome: Adequate for Discharge Goal: OT Additional ADL Goal #1 Outcome: Adequate for Discharge   Problem: Acute Rehab PT Goals(only PT should resolve) Goal: Pt Will Go Supine/Side To Sit Outcome: Adequate for Discharge Goal: Patient Will Transfer Sit To/From Stand Outcome: Adequate for Discharge Goal: Pt Will Ambulate Outcome: Adequate for Discharge Goal: Pt Will Go Up/Down Stairs Outcome: Adequate for Discharge Goal: Pt/caregiver will Perform Home Exercise Program Outcome: Adequate for Discharge   Problem: Education: Goal: Knowledge of General Education information will improve Description: Including pain rating scale, medication(s)/side effects and non-pharmacologic comfort measures Outcome: Adequate for Discharge   Problem: Health Behavior/Discharge Planning: Goal: Ability to manage health-related needs will improve Outcome: Adequate for Discharge   Problem: Clinical Measurements: Goal: Ability to maintain clinical measurements within normal limits will improve 05/10/2024 1106 by Johnie Will HERO, RN Outcome: Adequate for Discharge 05/10/2024 1044 by Johnie Will HERO, RN Outcome: Progressing Goal: Will remain free from infection 05/10/2024 1106 by Johnie Will HERO, RN Outcome: Adequate for Discharge 05/10/2024 1044 by Johnie Will HERO, RN Outcome: Progressing Goal: Diagnostic test results will improve 05/10/2024 1106 by Johnie Will HERO, RN Outcome: Adequate for Discharge 05/10/2024 1044 by Johnie Will HERO, RN Outcome: Progressing Goal: Respiratory complications will improve 05/10/2024 1106 by Johnie Will HERO, RN Outcome: Adequate for Discharge 05/10/2024 1044 by Johnie Will HERO, RN Outcome: Progressing Goal: Cardiovascular complication will be avoided 05/10/2024 1106  by Johnie Will HERO, RN Outcome: Adequate for Discharge 05/10/2024 1044 by Johnie Will HERO, RN Outcome: Progressing   Problem: Activity: Goal: Risk for activity intolerance will decrease 05/10/2024 1106 by Johnie Will HERO, RN Outcome: Adequate for Discharge 05/10/2024 1044 by Johnie Will HERO, RN Outcome: Progressing   Problem: Nutrition: Goal: Adequate nutrition will be maintained 05/10/2024 1106 by Johnie Will HERO, RN Outcome: Adequate for Discharge 05/10/2024 1044 by Johnie Will HERO, RN Outcome: Progressing   Problem: Coping: Goal: Level of anxiety will decrease 05/10/2024 1106 by Johnie Will HERO, RN Outcome: Adequate for Discharge 05/10/2024 1044 by Johnie Will HERO, RN Outcome: Progressing   Problem: Elimination: Goal: Will not experience complications related to bowel motility 05/10/2024 1106 by Johnie Will HERO, RN Outcome: Adequate for Discharge 05/10/2024 1044 by Johnie Will HERO, RN Outcome: Progressing Goal: Will not experience complications related to urinary retention 05/10/2024 1106 by Johnie Will HERO, RN Outcome: Adequate for Discharge 05/10/2024 1044 by Johnie Will HERO, RN Outcome: Progressing   Problem: Pain Managment: Goal: General experience of comfort will improve and/or be controlled 05/10/2024 1106 by Johnie Will HERO, RN Outcome: Adequate for Discharge 05/10/2024 1044 by Johnie Will HERO, RN Outcome: Progressing   Problem: Safety: Goal: Ability to remain free from injury will improve 05/10/2024 1106 by Johnie Will HERO, RN Outcome: Adequate for Discharge 05/10/2024 1044 by Johnie Will HERO, RN Outcome: Progressing   Problem: Skin Integrity: Goal: Risk for impaired skin integrity will decrease 05/10/2024 1106 by Johnie Will HERO, RN Outcome: Adequate for Discharge 05/10/2024 1044 by Johnie Will HERO, RN Outcome: Progressing

## 2024-05-10 NOTE — TOC Transition Note (Signed)
 Transition of Care [facility]) - Discharge Note   Patient Details  Name: Nathan Rivera MRN: [id number] Date of Birth: 08/30/1946  [facility] CM/SW Contact:  Lauraine [name], LCSW Phone Number: 05/10/2024, 12:03 PM   Clinical Narrative: Patient has orders to discharge home today. Readmission prevention screen complete. Patient is asleep. Wife and daughter, Leita, at bedside. CSW introduced role and explained that discharge planning would be discussed. PCP is Cheryl Jericho, MD. Wife drives him to appointments. [facility] [address] for [facility]-time prescriptions. Otherwise they use [facility]. No issues obtaining medications. Patient lives home with his wife. He is active with [facility] for PT, OT, RN. Family asked about adding an aide. [facility] does not have aides but uses OT assistants in their place which they prefer because, along with OT, they provide education on how family can manage him at home when they are not there. [facility] liaison will call wife shortly. Per last admission, patient has RW, BSC, built-in shower seat, and shower wand at home. They are not interested in hospital bed or hoyer lift at this time. No further concerns. CSW signing off.  Final next level of care: Home w Home Health Services Barriers to Discharge: No Barriers Identified   Patient Goals and CMS Choice     Choice offered to / list presented to : Spouse, Adult Children      Discharge Placement                Patient to be transferred to facility by: Family Name of family member notified: [name] and Leita Patient and family notified of of transfer: 05/10/24  Discharge Plan and Services Additional resources added to the After Visit Summary for                            [facility] Arranged: RN, PT, OT (OT assistant) [facility] Agency: [facility] Date [facility] Agency Contacted: 05/10/24   Representative spoke with at [facility] Agency: [place]  Social Drivers of Health  (SDOH) Interventions SDOH Screenings   Food Insecurity: No Food Insecurity (05/09/2024)  Housing: Low Risk  (05/09/2024)  Transportation Needs: No Transportation Needs (05/09/2024)  Utilities: Not At Risk (05/09/2024)  Financial Resource Strain: Low Risk  (07/19/2023)   [facility] from [facility]  Social Connections: Moderately Isolated (05/09/2024)  Tobacco Use: High Risk (05/09/2024)     Readmission Risk Interventions    05/10/2024   12:01 PM  Readmission Risk Prevention Plan  Transportation Screening Complete  PCP or Specialist Appt within 3-5 Days Complete  HRI or Home Care Consult Complete  Social Work Consult for Recovery Care Planning/Counseling Complete  Palliative Care Screening Not Applicable  Medication Review [employer]) Complete

## 2024-05-10 NOTE — Progress Notes (Signed)
 Physical Therapy Treatment Patient Details Name: Nathan Rivera MRN: [id number] DOB: 20-Aug-1946 Today's Date: 05/10/2024   History of Present Illness 78 y/o male presented to ED on 05/08/24 for low creatine, bradycardia, and hypotensive. Recent admission 7/21-8/5 after AAA rupture s/p repair. PMH: dementia, HTN, PAD, AAA rupture s/p repair 2025, CKD    PT Comments  Initially, pt demonstrated lethargy and  required more cues for initiation of movement and maintaining attention to participate with mobility tasks but after about 15 min pt able to engage and was more attentive to mobility commands.  Pt performed mobility at an overall CGA level with cues for safe sequence and technique.  Cues required for safe walker management. multiple stops to have pt catch up  to his walker, difficulty following through with keeping inside walker' for 50'. Pt will benefit from continued PT services upon discharge to safely address deficits listed in patient problem list for decreased caregiver assistance and eventual return to PLOF.     If plan is discharge home, recommend the following: Assist for transportation;Help with stairs or ramp for entrance;A little help with walking and/or transfers;A lot of help with bathing/dressing/bathroom;Assistance with cooking/housework;Direct supervision/assist for medications management   Can travel by private vehicle     Yes  Equipment Recommendations       Recommendations for Other Services       Precautions / Restrictions Precautions Precautions: Fall Recall of Precautions/Restrictions: Impaired Restrictions Weight Bearing Restrictions Per Provider Order: No     Mobility  Bed Mobility Overal bed mobility: Needs Assistance Bed Mobility: Supine to Sit, Rolling Rolling: Min assist   Supine to sit: Contact guard     General bed mobility comments: VC for initiation and sequencing    Transfers Overall transfer level: Needs assistance Equipment used:  Rolling walker (2 wheels) Transfers: Sit to/from Stand, Bed to chair/wheelchair/BSC Sit to Stand: Supervision, Contact guard assist   Step pivot transfers: Contact guard assist       General transfer comment: cues for walker management when turning to sit, for hand placement, and not to sit too early.    Ambulation/Gait Ambulation/Gait assistance: Contact guard assist Gait Distance (Feet): 50 Feet Assistive device: Rolling walker (2 wheels) Gait Pattern/deviations: Decreased stride length, Decreased step length - right, Decreased step length - left Gait velocity: decreased     General Gait Details: cues for safe walker management. multiple stops to have pt catch up  to his walker, difficulty following through with keeping inside walker'.   Stairs Stairs:  (did not perform; pt has a ramp at home.)           Wheelchair Mobility     Tilt Bed    Modified Rankin (Stroke Patients Only)       Balance Overall balance assessment: Needs assistance Sitting-balance support: Feet supported, No upper extremity supported Sitting balance-Leahy Scale: Good     Standing balance support: During functional activity, Single extremity supported Standing balance-Leahy Scale: Good Standing balance comment: demonstrates increased ability to stand and participate in activities.                            Communication Communication Communication: No apparent difficulties Factors Affecting Communication: Difficulty expressing self  Cognition Arousal: Lethargic, Alert Behavior During Therapy: WFL for tasks assessed/performed   PT - Cognitive impairments: History of cognitive impairments, Problem solving, Safety/Judgement, Memory, Sequencing, Awareness, Attention, Initiation  PT - Cognition Comments: hx of dementia; initially lethargic and needed cues to initiate getting sitting up EOB and to not keep lying back down.  After sitting up for  awhile pt seems more alert. Following commands: Impaired Following commands impaired: Follows one step commands inconsistently, Follows one step commands with increased time    Cueing Cueing Techniques: Verbal cues, Tactile cues  Exercises      General Comments        Pertinent Vitals/Pain Pain Assessment Pain Assessment: Faces Faces Pain Scale: Hurts a little bit Pain Location: neck and trunk stiffness Pain Descriptors / Indicators: Discomfort, Sore Pain Intervention(s): Monitored during session    Home Living                          Prior Function            PT Goals (current goals can now be found in the care plan section) Acute Rehab PT Goals Patient Stated Goal: did not state PT Goal Formulation: Patient unable to participate in goal setting Time For Goal Achievement: 05/23/24 Potential to Achieve Goals: Fair Progress towards PT goals: Progressing toward goals    Frequency    Min 2X/week      PT Plan      Co-evaluation              AM-PAC PT 6 Clicks Mobility   Outcome Measure  Help needed turning from your back to your side while in a flat bed without using bedrails?: None Help needed moving from lying on your back to sitting on the side of a flat bed without using bedrails?: A Little   Help needed standing up from a chair using your arms (e.g., wheelchair or bedside chair)?: A Little Help needed to walk in hospital room?: A Little Help needed climbing 3-5 steps with a railing? : A Little 6 Click Score: 16    End of Session Equipment Utilized During Treatment: Gait belt Activity Tolerance: Patient tolerated treatment well Patient left: in chair;with call bell/phone within reach;with chair alarm set;with family/visitor present Nurse Communication: Mobility status PT Visit Diagnosis: Other abnormalities of gait and mobility (R26.89);Muscle weakness (generalized) (M62.81);Difficulty in walking, not elsewhere classified  (R26.2);Unsteadiness on feet (R26.81)     Time: 1003-1030 PT Time Calculation (min) (ACUTE ONLY): 27 min  Charges:    $Gait Training: 8-22 mins $Therapeutic Activity: 8-22 mins PT General Charges $$ ACUTE PT VISIT: 1 Visit                     Harland Irving, PTA  05/10/24, 10:48 AM

## 2024-06-13 ENCOUNTER — Other Ambulatory Visit (INDEPENDENT_AMBULATORY_CARE_PROVIDER_SITE_OTHER): Payer: Self-pay | Admitting: Vascular Surgery

## 2024-06-13 ENCOUNTER — Encounter: Payer: Self-pay | Admitting: Gastroenterology

## 2024-06-13 DIAGNOSIS — T82310A Breakdown (mechanical) of aortic (bifurcation) graft (replacement), initial encounter: Secondary | ICD-10-CM

## 2024-06-13 DIAGNOSIS — I714 Abdominal aortic aneurysm, without rupture, unspecified: Secondary | ICD-10-CM

## 2024-06-14 ENCOUNTER — Encounter (INDEPENDENT_AMBULATORY_CARE_PROVIDER_SITE_OTHER): Payer: Self-pay | Admitting: Nurse Practitioner

## 2024-06-14 ENCOUNTER — Ambulatory Visit (INDEPENDENT_AMBULATORY_CARE_PROVIDER_SITE_OTHER): Admitting: Nurse Practitioner

## 2024-06-14 ENCOUNTER — Ambulatory Visit (INDEPENDENT_AMBULATORY_CARE_PROVIDER_SITE_OTHER)

## 2024-06-14 VITALS — BP 131/74 | HR 60 | Ht 69.0 in | Wt 198.0 lb

## 2024-06-14 DIAGNOSIS — I714 Abdominal aortic aneurysm, without rupture, unspecified: Secondary | ICD-10-CM

## 2024-06-14 DIAGNOSIS — E782 Mixed hyperlipidemia: Secondary | ICD-10-CM

## 2024-06-14 DIAGNOSIS — I701 Atherosclerosis of renal artery: Secondary | ICD-10-CM

## 2024-06-14 DIAGNOSIS — I723 Aneurysm of iliac artery: Secondary | ICD-10-CM

## 2024-06-14 DIAGNOSIS — T82310A Breakdown (mechanical) of aortic (bifurcation) graft (replacement), initial encounter: Secondary | ICD-10-CM

## 2024-06-14 NOTE — Progress Notes (Signed)
 Subjective:    Patient ID: Nathan Rivera, male    DOB: Jun 19, 1946, 78 y.o.   MRN: [id number] Chief Complaint  Patient presents with   Follow-up     Follow up with Marea Selinda RAMAN, MD (Vascular Surgery) in 6 weeks (06/13/2024); EVAR with iliac stents.     The patient returns to the office for surveillance of an endovascular repair of a left internal iliac artery.  In addition a 1B endoleak was repaired with angioplasty of the left renal artery done on 04/26/2024. Procedure: Procedure(s) Performed:             1.  Ultrasound-guided access to the femoral arteries bilaterally.             2.  Introduction catheter into aorta bilateral femoral artery approach             3.  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  Extension of the left limb of the existing EVAR into the left external iliac artery for exclusion of type Ib endoleak             5.  Selective injection of the left renal artery             6.  Angioplasty of the left renal artery using a 5 mm balloon             7.  Celt closure of the right femoral artery             8.  Perclose closure of the left common femoral artery in the preclose fashion   Patient denies abdominal pain or back pain, no other abdominal complaints.  No symptoms consistent with distal embolization No changes in claudication distance or new rest pain symptoms. No interval development of new ulcers or wounds  There have been no significant interval changes in his overall healthcare since his last visit.   Patient denies amaurosis fugax or TIA symptoms.  The patient denies recent episodes of angina or shortness of breath.   Duplex US  of the aorta and iliac arteries shows a 3.7 aorta as well as 3.7 left common iliac artery.  There is an incidental finding of a mass in his left abdomen at the right cage which measures 6.17 x 8.74 cm with no blood flow seen again.    Review of Systems   Gastrointestinal:  Negative for abdominal distention, abdominal pain and blood in stool.  All other systems reviewed and are negative.      Objective:   Physical Exam Vitals reviewed.  HENT:     Head: Normocephalic.  Cardiovascular:     Rate and Rhythm: Normal rate.  Pulmonary:     Effort: Pulmonary effort is normal.  Skin:    General: Skin is warm and dry.  Neurological:     Mental Status: He is alert and oriented to person, place, and time.  Psychiatric:        Mood and Affect: Mood normal.        Behavior: Behavior normal.        Thought Content: Thought content normal.        Judgment: Judgment normal.     Ht 5' 9 (1.753 m)   Wt 198 lb (89.8 kg)   BMI 29.24 kg/m   Past Medical History:  Diagnosis Date   Alzheimer dementia (HCC)  Aortic aneurysm (HCC)    Arthritis    knees are the biggest problem   Cancer (HCC) 2012   bladder   CKD (chronic kidney disease), stage III (HCC) 2014   relative to infection, septic- renal calculi   Degenerative disc disease    Degenerative joint disease    GERD (gastroesophageal reflux disease)    History of kidney stones    Hypercholesterolemia    Hypertension    no meds currently   Impaired glucose tolerance    Muscle fatigue    muscle fatigue relative to sepsis experience    Peripheral vascular disease (HCC)    Pneumonia    Raynaud's disease    Sepsis (HCC) 07/2013   Stroke (HCC)    Thromboembolism of left popilteal vein 2015   pt. unsure which leg, treating with Xarelto    UTI (urinary tract infection) 08/17/2013   Lt nephrostomy tube and stent placement    Social History   Socioeconomic History   Marital status: Married    Spouse name: Soil scientist   Number of children: Not on file   Years of education: Not on file   Highest education level: Not on file  Occupational History   Not on file  Tobacco Use   Smoking status: Former    Current packs/day: 1.00    Average packs/day: 1 pack/day for 40.0 years (40.0  ttl pk-yrs)    Types: Cigarettes    Passive exposure: Past   Smokeless tobacco: Never   Tobacco comments:    since age 59  Vaping Use   Vaping status: Never Used  Substance and Sexual Activity   Alcohol use: Yes    Alcohol/week: 3.0 standard drinks of alcohol    Types: 3 Glasses of wine per week   Drug use: No   Sexual activity: Not on file  Other Topics Concern   Not on file  Social History Narrative   Not on file   Social Drivers of Health   Financial Resource Strain: Low Risk  (06/07/2024)   Received from [facility]   Overall Financial Resource Strain (CARDIA)    Difficulty of Paying Living Expenses: Not hard at all  Food Insecurity: No Food Insecurity (06/07/2024)   Received from [facility]   Hunger Vital Sign    Within the past 12 months, you worried that your food would run out before you got the money to buy more.: Never true    Within the past 12 months, the food you bought just didn't last and you didn't have money to get more.: Never true  Transportation Needs: No Transportation Needs (06/07/2024)   Received from [facility] - Transportation    In the past 12 months, has lack of transportation kept you from medical appointments or from getting medications?: No    Lack of Transportation (Non-Medical): No  Physical Activity: Not on file  Stress: Not on file  Social Connections: Moderately Isolated (05/09/2024)   Social Connection and Isolation Panel    Frequency of Communication with Friends and Family: Once a week    Frequency of Social Gatherings with Friends and Family: Twice a week    Attends Religious Services: Patient declined    Database administrator or Organizations: No    Attends Banker Meetings: Never    Marital Status: Married  Catering manager Violence: Not At Risk (05/09/2024)   Humiliation, Afraid, Rape, and Kick questionnaire    Fear of  Current or Ex-Partner: No     Emotionally Abused: No    Physically Abused: No    Sexually Abused: No    Past Surgical History:  Procedure Laterality Date   ABDOMINAL AORTIC ANEURYSM REPAIR  2011   CATARACT EXTRACTION W/PHACO Left 02/15/2021   Procedure: CATARACT EXTRACTION PHACO AND INTRAOCULAR LENS PLACEMENT (IOC) LEFT 5.59 00:44.0;  Surgeon: Myrna Adine Anes, MD;  Location: [facility];  Service: Ophthalmology;  Laterality: Left;   CATARACT EXTRACTION W/PHACO Right 03/08/2021   Procedure: CATARACT EXTRACTION PHACO AND INTRAOCULAR LENS PLACEMENT (IOC) RIGHT 1.81 00:22.7;  Surgeon: Myrna Adine Anes, MD;  Location: [facility];  Service: Ophthalmology;  Laterality: Right;   ENDOVASCULAR REPAIR/STENT GRAFT N/A 04/26/2024   Procedure: ENDOVASCULAR REPAIR/STENT GRAFT;  Surgeon: Jama Cordella MATSU, MD;  Location: [facility];  Service: Cardiovascular;  Laterality: N/A;   ESOPHAGOGASTRODUODENOSCOPY N/A 04/16/2024   Procedure: EGD (ESOPHAGOGASTRODUODENOSCOPY);  Surgeon: Jinny Carmine, MD;  Location: [facility] END[facility];  Service: Endoscopy;  Laterality: N/A;   HERNIA REPAIR     63mo old   INSERTION OF MESH N/A 10/24/2014   Procedure: INSERTION OF MESH;  Surgeon: Deward Null III, MD;  Location: [facility] OR;  Service: General;  Laterality: N/A;   LAPAROTOMY N/A 08/24/2013   Procedure: EXPLORATORY LAPAROTOMY;  Surgeon: Deward GORMAN Null DOUGLAS, MD;  Location: [facility] OR;  Service: General;  Laterality: N/A;  Gram patch closure   litrotripsy  09/24/2013   NEPHROSTOMY  Nov. 2014   nephrostomy removal  Dec. 2014   PERIPHERALLY INSERTED CENTRAL CATHETER INSERTION     RENAL ANGIOGRAPHY Bilateral 04/19/2024   Procedure: RENAL ANGIOGRAPHY;  Surgeon: Marea Selinda GORMAN, MD;  Location: [facility];  Service: Cardiovascular;  Laterality: Bilateral;   TONSILLECTOMY     UMBILICAL HERNIA REPAIR     50 yr ago   VENTRAL HERNIA REPAIR N/A 10/24/2014   Procedure: LAPAROSCOPIC ASSISTED VENTRAL HERNIA REPAIR WITH MESH;  Surgeon: Deward Null III, MD;   Location: [facility] OR;  Service: General;  Laterality: N/A;   XI ROBOTIC ASSISTED INGUINAL HERNIA REPAIR WITH MESH Bilateral 11/17/2022   Procedure: XI ROBOTIC ASSISTED INGUINAL HERNIA REPAIR WITH MESH-Bilateral;  Surgeon: Desiderio Schanz, MD;  Location: [facility] ORS;  Service: General;  Laterality: Bilateral;    Family History  Problem Relation Age of Onset   CVA Father    Atrial fibrillation Sister     Allergies  Allergen Reactions   Duloxetine Dermatitis       Latest Ref Rng & Units 05/10/2024    4:12 AM 05/09/2024    3:50 AM 05/08/2024   12:16 PM  CBC  WBC 4.0 - 10.5 K/uL 8.4  8.7  11.2   Hemoglobin 13.0 - 17.0 g/dL 8.6  8.4  9.3   Hematocrit 39.0 - 52.0 % 26.9  26.4  29.2   Platelets 150 - 400 K/uL 255  290  337       CMP     Component Value Date/Time   NA 140 05/10/2024 0412   NA 144 10/23/2014 1630   K 3.7 05/10/2024 0412   K 4.1 10/23/2014 1630   CL 113 (H) 05/10/2024 0412   CL 112 (H) 10/23/2014 1630   CO2 21 (L) 05/10/2024 0412   CO2 26 10/23/2014 1630   GLUCOSE 90 05/10/2024 0412   GLUCOSE 83 10/23/2014 1630   BUN 42 (H) 05/10/2024 0412   BUN 30 (H) 10/23/2014 1630   CREATININE 3.08 (H) 05/10/2024 0412   CREATININE 1.96 (  H) 10/23/2014 1630   CALCIUM 8.7 (L) 05/10/2024 0412   CALCIUM 9.0 10/23/2014 1630   PROT 6.3 (L) 05/09/2024 0350   PROT 5.8 (L) 08/18/2013 0504   ALBUMIN  2.9 (L) 05/09/2024 0350   ALBUMIN  1.4 (L) 08/18/2013 0504   AST 48 (H) 05/09/2024 0350   AST 71 (H) 08/18/2013 0504   ALT 25 05/09/2024 0350   ALT 57 08/18/2013 0504   ALKPHOS 126 05/09/2024 0350   ALKPHOS 349 (H) 08/18/2013 0504   BILITOT 1.3 (H) 05/09/2024 0350   BILITOT 1.2 (H) 08/18/2013 0504   GFRNONAA 20 (L) 05/10/2024 0412   GFRNONAA 36 (L) 10/23/2014 1630   GFRNONAA 8 (L) 08/18/2013 0504     No results found.     Assessment & Plan:   1. Abdominal aneurysm (HCC) (Primary) Recommend:  Patient is status post successful endovascular repair of the AAA.   No further  intervention is required at this time.   No endoleak is detected and the aneurysm sac is stable.  The patient will continue antiplatelet therapy as prescribed as well as aggressive management of hyperlipidemia. Exercise is encouraged.   However, endografts require continued surveillance with ultrasound or CT scan. This is mandatory to detect any changes that allow repressurization of the aneurysm sac.  The patient is informed that this would be asymptomatic.  The patient is reminded that lifelong routine surveillance is a necessity with an endograft. Patient will continue to follow-up at the specified interval with ultrasound of the aorta.  2. Aneurysm of iliac artery (HCC) Noninvasive studies show decrease in his iliac artery size.  Currently diameter is approximately 3.66 cm.  This is decreased from the previous 5 cm as it was noted on CT.  Incidentally there appears to be a mass in his abdomen below the rib cage which measures 6.17 x 8.74 cm with no flow within.  It is suspicious for possible seroma/hematoma.  Currently he has no pain or discomfort and based on this we will plan on reevaluating in 3 months but if the growth is still present or larger we will certainly before CT scan.  We discussed that option of doing a CT scan at this time however following discussion patient elects for follow-up ultrasound.  3. Renal artery stenosis (HCC) Recommend:  BP today was acceptable Given patient's atherosclerosis and PAD optimal control of the patient's hypertension is important.  The patient's BP and noninvasive studies support the previous intervention is patent. No further intervention is indicated at this time.  Therefore the patient  will continue the current medications, no changes at this time.  The primary medical service will continue aggressive antihypertensive therapy as per the [facility] guidelines   Patient will follow-up with duplex ultrasound of the renal arteries as ordered.  4.  Mixed hyperlipidemia Continue statin as ordered and reviewed, no changes at this time   Current Outpatient Medications on File Prior to Visit  Medication Sig Dispense Refill   acetaminophen  (TYLENOL ) 325 MG tablet Take 325 mg by mouth every 6 (six) hours as needed for mild pain.     amLODipine  (NORVASC ) 10 MG tablet Take 1 tablet (10 mg total) by mouth daily. 30 tablet 3   aspirin  EC 81 MG tablet Take 1 tablet (81 mg total) by mouth daily. Swallow whole. 30 tablet 12   bisacodyl  (DULCOLAX) 10 MG suppository Place 1 suppository (10 mg total) rectally daily as needed for severe constipation. 12 suppository 0   bisacodyl  (DULCOLAX) 5 MG EC tablet Take  2 tablets (10 mg total) by mouth at bedtime. 30 tablet 0   cholecalciferol (VITAMIN D) 1000 UNITS tablet Take 1,000 Units by mouth daily.     donepezil  (ARICEPT ) 10 MG tablet Take 10 mg by mouth at bedtime.     FOLIC ACID PO Take by mouth.     gabapentin  (NEURONTIN ) 100 MG capsule 200 mg 2 (two) times daily. 200 mg in am and 200 mg in PM     memantine (NAMENDA) 10 MG tablet TAKE 1 TABLET BY MOUTH TWICE DAILY FOR MEMORY LOSS     pantoprazole  (PROTONIX ) 40 MG tablet Take 40 mg by mouth at bedtime.      simvastatin  (ZOCOR ) 20 MG tablet Take 20 mg by mouth at bedtime.     vitamin B-12 (CYANOCOBALAMIN ) 500 MCG tablet Take 500 mcg by mouth daily.     XARELTO  20 MG TABS tablet Take 20 mg by mouth daily with supper.     polyethylene glycol powder (GLYCOLAX /MIRALAX ) 17 GM/SCOOP powder Take 17 g by mouth 2 (two) times daily. (Patient not taking: Reported on 06/14/2024) 238 g 0   No current facility-administered medications on file prior to visit.    There are no Patient Instructions on file for this visit. No follow-ups on file.   Lakeena Downie E Eugine Bubb, NP

## 2024-06-20 NOTE — Progress Notes (Addendum)
 Nathan Rivera is a 78 y.o. male that comes today for the following problem(s):   Chief Complaint  Patient presents with  . Follow-up    AWV    HPI: Patient in the office for follow-up of chronic medical conditions including hypertension, hyperlipidemia, impaired glucose tolerance, stage IV chronic kidney disease, duodenal ulcer, peripheral vascular disease, AAA, chronic right sided low back pain, thrombocytopenia, Raynaud's disease, B12 deficiency and history of bladder cancer.  He is also in the office for his Medicare annual wellness visit.  He states he is feeling okay overall but continues to note significant generalized weakness.  He is almost finished with physical therapy and they are discussing extending his treatments due to his incomplete recovery.  His blood pressures have remained well-controlled with amlodipine .  He denies headaches, vision changes, speech problems, focal neurologic symptoms, chest pain, shortness of breath or palpitations.  He continues to take his simvastatin  regularly for the control of his cholesterol.  He is trying to eat a healthy diet though his activity remains limited.  He denies polyuria, polyphagia or polydipsia.  He is trying to stay well-hydrated for the control of his stage IV chronic kidney disease.  He follows regularly with nephrology.  He continues to take his pantoprazole  due to his duodenal ulcer.  He denies epigastric pain, swallowing difficulty or early satiety.  He follows regularly with vascular surgery regarding his peripheral vascular disease and AAA.  He notes his low back pain has been stable with intermittent use of acetaminophen .  He denies unusual bruising or bleeding related to his thrombocytopenia.  His Raynaud's disease has remained stable on the amlodipine .  He continues to take his B12 supplement regularly for his B12 deficiency.  He denies hematuria recently and has been released by urology for his history of bladder cancer.  He denies side  effect problems with his medications.  MEDICARE WELLNESS VISIT  Providers Rendering Care 1. Dr. Cheryl Jericho (PCP)  2. Dr. Cordella Shawl (Vascular Surgery) 3. Dr. Arthea Farrow (Neurology) 4. Dr. Saralee Stank (Nephrology) 5. Dr. Marsa Dooms (Cardiology)  Functional Assessment (1) Hearing: Demonstrates no difficulty in hearing during normal conversation (2) Risk of Falls: Patient notes occasional falls when his knee gives out in the last year, Gait steady with assistance of walker during walk from waiting area to exam room (3) Home Safety: Patient feels secure in their home, There are operational smoke alarms in multiple areas of the home (4) Activities of Daily Living: Independently manages personal grooming and household chores, including cooking, cleaning and laundry. Manages Personal finances without assistance.  Depression Screening Denies loss of interest in normal activities, has no episodes of weeping or anxiety and reports no changes in appetite or sleep.  Cognitive Impairment Patient denies episodes of loosing things, being forgetful. Seems oriented to person, place and time. Responses appear appropriate and timely to this observer.  PREVENTION PLAN  Cardiovascular: Last LDL - 78 Diabetes: Last A1c - 5.3 Glaucoma: N/A Hepatitis B (HBV) Vaccine:  Not Applicable Smoking Cessation:  Counseled  Other Personalized Health Advice  Encouraged patient to exercise 5 days a week, walking, water aerobics, gentle stretching recommended. Increase dietary intake of fresh fruits and vegetables, reduce red meat to twice a week.  End of Life Counseling Patient has no living will in place or POA; Full Code    Goals     . Maintain health/healthy lifestyle        Patient Active Problem List  Diagnosis  . Pure hypercholesterolemia  .  Unspecified essential hypertension  . Osteoarthrosis, unspecified whether generalized or localized, lower leg  . Chronic kidney  disease, stage III (moderate) (CMS/HHS-HCC)  . Hypertrophy of prostate with urinary obstruction and other lower urinary tract symptoms (LUTS)  . Personal history of malignant neoplasm of bladder  . Peripheral vascular disease ()  . Chronic or unspecified gastric ulcer with perforation (CMS/HHS-HCC)  . Calculus of kidney  . Essential hypertension  . Benign prostatic hypertrophy with lower urinary tract symptoms (LUTS)  . Abnormal electrocardiogram  . Duodenal ulcer  . Impaired glucose tolerance  . Incisional hernia  . Bradycardia     Past Medical History:  Diagnosis Date  . Chronic kidney disease   . Duodenal ulcer   . Elevated fasting blood sugar   . Obesity   . Osteoarthrosis, unspecified whether generalized or localized, lower leg    knees  . Pure hypercholesterolemia   . Seasonal allergic rhinitis   . Unspecified essential hypertension   . Vitamin D deficiency      Past Surgical History:  Procedure Laterality Date  . TRIPLE A REPAIR  2012  . HERNIA REPAIR    . NASAL CAUTERY    . TONSILLECTOMY      Social History   Socioeconomic History  . Marital status: Married  Tobacco Use  . Smoking status: Former    Current packs/day: 0.75    Average packs/day: 0.8 packs/day for 40.0 years (30.0 ttl pk-yrs)    Types: Cigarettes  . Smokeless tobacco: Never  . Tobacco comments:    Quit on 04/15/24  Vaping Use  . Vaping status: Never Used  Substance and Sexual Activity  . Alcohol use: Yes    Alcohol/week: 3.3 standard drinks of alcohol    Types: 4 Standard drinks or equivalent per week    Comment: Rare.  . Drug use: No  . Sexual activity: Defer   Social Drivers of Health   Financial Resource Strain: Low Risk  (06/20/2024)   Overall Financial Resource Strain (CARDIA)   . Difficulty of Paying Living Expenses: Not hard at all  Food Insecurity: No Food Insecurity (06/20/2024)   Hunger Vital Sign   . Worried About Programme researcher, broadcasting/film/video in the Last Year: Never true   .  Ran Out of Food in the Last Year: Never true  Transportation Needs: No Transportation Needs (06/20/2024)   PRAPARE - Transportation   . Lack of Transportation (Medical): No   . Lack of Transportation (Non-Medical): No  Social Connections: Moderately Isolated (05/09/2024)   Received from [facility]   Social Connection and Isolation Panel   . In a typical week, how many times do you talk on the phone with family, friends, or neighbors?: Once a week   . How often do you get together with friends or relatives?: Twice a week   . How often do you attend church or religious services?: Patient declined   . Do you belong to any clubs or organizations such as church groups, unions, fraternal or athletic groups, or school groups?: No   . How often do you attend meetings of the clubs or organizations you belong to?: Never   . Are you married, widowed, divorced, separated, never married, or living with a partner?: Married  Housing Stability: Low Risk  (06/20/2024)   Housing Stability Vital Sign   . Unable to Pay for Housing in the Last Year: No   . Number of Times Moved in the Last Year: 0   . Homeless in  the Last Year: No    Family History  Problem Relation Name Age of Onset  . High blood pressure (Hypertension) Sister    . Stroke Sister       Allergies  Allergen Reactions  . Duloxetine Rash and Dermatitis    Prior to Admission medications  Medication Sig Taking? Last Dose  acetaminophen  (TYLENOL ) 325 MG tablet Take 325 mg by mouth Yes Taking  amLODIPine  (NORVASC ) 5 MG tablet TAKE 1 TABLET BY MOUTH ONCE  DAILY Yes Taking  aspirin  81 MG EC tablet Take 81 mg by mouth once daily Yes Taking  cholecalciferol (VITAMIN D3) 1000 unit tablet Take 1,000 Units by mouth once daily Yes Taking  cyanocobalamin  (VITAMIN B12) 500 MCG tablet Take 500 mcg by mouth once daily Yes Taking  docusate sodium  (STOOL SOFTENER ORAL) Take by mouth Yes PRN Not Currently Taking  donepeziL  (ARICEPT ) 10 MG tablet TAKE 1  TABLET BY MOUTH AT  BEDTIME Yes Taking  FOLIC ACID ORAL Take 1 tablet by mouth once daily Yes Taking  gabapentin  (NEURONTIN ) 100 MG capsule TAKE 2 TO 3 CAPSULES BY MOUTH  TWICE DAILY Yes Taking  memantine (NAMENDA) 10 MG tablet TAKE 1 TABLET BY MOUTH TWICE  DAILY FOR MEMORY LOSS Yes Taking  pantoprazole  (PROTONIX ) 40 MG DR tablet TAKE 1 TABLET BY MOUTH IN THE  MORNING Yes Taking  simvastatin  (ZOCOR ) 20 MG tablet TAKE 1 TABLET BY MOUTH AT NIGHT Yes Taking  XARELTO  20 mg tablet TAKE 1 TABLET BY MOUTH DAILY  WITH DINNER Yes Taking     Objective:  BP 138/70 (BP Location: Left upper arm, Patient Position: Sitting, BP Cuff Size: Large Adult)   Pulse 64   Ht 180.3 cm (5' 11)   Wt 91.2 kg (201 lb)   SpO2 98%   BMI 28.03 kg/m   Physical Examination:  GENERAL:  The patient is alert, oriented and in no acute distress.  HEENT:  Head is normocephalic/atraumatic.  Pupils equal, round and reactive to light and accommodation.  Extraocular movements intact.  Nose and throat are clear. NECK/LYMPHATICS:  Supple without thyromegaly or lymphadenopathy.   RESPIRATORY:  Chest wall is within normal limits.   Clear to auscultation and percussion.   CARDIAC:  Regular rate and rhythm, normal S1 and S2 without murmurs, rubs or gallops.   VASCULAR:  Distal pulses 2+.   GASTROINTESTINAL  Soft. No organomegaly or tenderness found.    MUSCULOSKELETAL:  No cyanosis, clubbing or edema noted.   NEUROLOGIC:  The patient is alert and oriented x 3.  No focal deficits.  Walker to assist ambulation      A/P   Diagnoses and all orders for this visit:  Hypertension, unspecified type - stable  Pure hypercholesterolemia - stable -     Comprehensive Metabolic Panel (CMP); Future -     Lipid Panel w/calc LDL; Future  Impaired glucose tolerance - stable -     Hemoglobin A1C; Future  Stage 4 chronic kidney disease (CMS/HHS-HCC) - stable  Duodenal ulcer - stable  Peripheral vascular disease () -  stable  Aneurysm of infrarenal abdominal aorta, unspecified whether ruptured () - stable  Chronic right-sided low back pain without sciatica - stable  Thrombocytopenia () - stable -     CBC w/auto Differential (3 Part); Future  Raynaud's disease with gangrene (CMS/HHS-HCC) - stable  B12 deficiency - stable  Personal history of malignant neoplasm of bladder - stable  Medicare annual wellness visit, subsequent  Continue current medications - patient  instructed to have the pharmacy contact our office between visits for refills Counseled regarding diet and exercise - encourage to make healthy food choices and sensible portion sizes.  Encouraged regular as able Reviewed most recent labs with patient.  Await above ordered labs Continue to follow with specialist, most recent cardiology, neurology, vascular surgery and nephrology notes reviewed Continue with home PT.  Agree with extending sessions to improve strength. Immunizations: Recommend Prevnar 20, Shingrix and tetanus at pharmacy.  Flu shot yearly in the fall.  Recommend RSV vaccine  Return in about 3 months (around 09/19/2024) for Regular follow up, Schedule labs, Fasting.      Immunizations: Pneumonia: Recommend Prevnar 20 at pharmacy Shingles: Recommend Shingrix at pharmacy Tetanus: Recommend tetanus at pharmacy Flu shot yearly in the fall Recommend RSV vaccine   High Cholesterol: Care Instructions Overview   Cholesterol is a type of fat in your blood. It is needed for many body functions, such as making new cells. Cholesterol is made by your body. It also comes from food you eat. High cholesterol means that you have too much of the fat in your blood. This raises your risk of a heart attack and stroke. LDL and HDL are part of your total cholesterol. LDL is the bad cholesterol. High LDL can raise your risk for coronary artery disease, heart attack, and stroke. HDL is the good cholesterol. It helps clear bad cholesterol  from the body. High HDL is linked with a lower risk of coronary artery disease, heart attack, and stroke. Your cholesterol levels help your doctor find out your risk for having a heart attack or stroke. You and your doctor can talk about whether you need to lower your risk and what treatment is best for you. Treatment options include a heart-healthy lifestyle and medicine. Both options can help lower your cholesterol and your risk. The way you choose to lower your risk will depend on how high your risk is for heart attack and stroke. It will also depend on how you feel about taking medicines. Follow-up care is a key part of your treatment and safety. Be sure to make and go to all appointments, and call your doctor if you are having problems. It's also a good idea to know your test results and keep a list of the medicines you take. How can you care for yourself at home? Eat heart-healthy foods. Eat fruits, vegetables, whole grains, beans, and other high-fiber foods. Eat lean proteins, such as seafood, lean meats, beans, nuts, and soy products. Eat healthy fats, such as canola and olive oil. Choose foods that are low in saturated fat. Limit sodium and alcohol. Limit drinks and foods with added sugar. Be physically active. Try to do moderate activity at least 2 hours a week. Or try to do vigorous activity at least 1 hours a week. You may want to walk or try other activities, such as running, swimming, cycling, or playing tennis or team sports. Stay at a healthy weight or lose weight by making the changes in eating and physical activity listed above. Losing just a small amount of weight, even 5 to 10 pounds, can help reduce your risk for having a heart attack or stroke. Do not smoke. Manage other health problems. These include diabetes and high blood pressure. If you think you may have a problem with alcohol or drug use, talk to your doctor. If you take medicine, take it exactly as prescribed. Call  your doctor if you think you are having a  problem with your medicine. Check with your doctor or pharmacist before you use any other medicines, including over-the-counter medicines. Make sure your doctor knows all of the medicines, vitamins, herbal products, and supplements you take. Taking some medicines together can cause problems. When should you call for help? Watch closely for changes in your health, and be sure to contact your doctor if:   You need help making lifestyle changes.    You have questions about your medicine.  Where can you learn more? Log in to your [facility] account at [email or web address] and click on top menu option Health then select Search Medical Library. Enter [phone] in the search box and click the magnify glass to learn more about High Cholesterol: Care Instructions. Current as of: April 26, 2023 Content Version: 14.4  [address] [place].  Care instructions adapted under license by your healthcare professional. If you have questions about a medical condition or this instruction, always ask your healthcare professional. Romayne Alderman, [facility] disclaims any warranty or liability for your use of this information.

## 2024-06-26 ENCOUNTER — Other Ambulatory Visit: Payer: Self-pay

## 2024-06-26 ENCOUNTER — Inpatient Hospital Stay
Admission: EM | Admit: 2024-06-26 | Discharge: 2024-06-28 | DRG: 394 | Disposition: A | Discharge status: Home / Self Care | Attending: Internal Medicine | Admitting: Internal Medicine

## 2024-06-26 DIAGNOSIS — Z8551 Personal history of malignant neoplasm of bladder: Secondary | ICD-10-CM | POA: Diagnosis not present

## 2024-06-26 DIAGNOSIS — I739 Peripheral vascular disease, unspecified: Secondary | ICD-10-CM | POA: Diagnosis not present

## 2024-06-26 DIAGNOSIS — Z87442 Personal history of urinary calculi: Secondary | ICD-10-CM | POA: Diagnosis not present

## 2024-06-26 DIAGNOSIS — K5730 Diverticulosis of large intestine without perforation or abscess without bleeding: Secondary | ICD-10-CM | POA: Diagnosis not present

## 2024-06-26 DIAGNOSIS — Z823 Family history of stroke: Secondary | ICD-10-CM

## 2024-06-26 DIAGNOSIS — F0280 Dementia in other diseases classified elsewhere without behavioral disturbance: Secondary | ICD-10-CM | POA: Diagnosis present

## 2024-06-26 DIAGNOSIS — Z888 Allergy status to other drugs, medicaments and biological substances status: Secondary | ICD-10-CM

## 2024-06-26 DIAGNOSIS — K219 Gastro-esophageal reflux disease without esophagitis: Secondary | ICD-10-CM | POA: Diagnosis present

## 2024-06-26 DIAGNOSIS — K922 Gastrointestinal hemorrhage, unspecified: Secondary | ICD-10-CM | POA: Diagnosis not present

## 2024-06-26 DIAGNOSIS — E7800 Pure hypercholesterolemia, unspecified: Secondary | ICD-10-CM | POA: Diagnosis present

## 2024-06-26 DIAGNOSIS — Z7982 Long term (current) use of aspirin: Secondary | ICD-10-CM | POA: Diagnosis not present

## 2024-06-26 DIAGNOSIS — Z8711 Personal history of peptic ulcer disease: Secondary | ICD-10-CM

## 2024-06-26 DIAGNOSIS — Z8679 Personal history of other diseases of the circulatory system: Secondary | ICD-10-CM

## 2024-06-26 DIAGNOSIS — Z9841 Cataract extraction status, right eye: Secondary | ICD-10-CM

## 2024-06-26 DIAGNOSIS — K5731 Diverticulosis of large intestine without perforation or abscess with bleeding: Secondary | ICD-10-CM | POA: Diagnosis present

## 2024-06-26 DIAGNOSIS — Z9862 Peripheral vascular angioplasty status: Secondary | ICD-10-CM | POA: Diagnosis not present

## 2024-06-26 DIAGNOSIS — Z8673 Personal history of transient ischemic attack (TIA), and cerebral infarction without residual deficits: Secondary | ICD-10-CM

## 2024-06-26 DIAGNOSIS — I10 Essential (primary) hypertension: Secondary | ICD-10-CM | POA: Diagnosis present

## 2024-06-26 DIAGNOSIS — T45515A Adverse effect of anticoagulants, initial encounter: Secondary | ICD-10-CM | POA: Diagnosis present

## 2024-06-26 DIAGNOSIS — D631 Anemia in chronic kidney disease: Secondary | ICD-10-CM | POA: Diagnosis present

## 2024-06-26 DIAGNOSIS — Z79899 Other long term (current) drug therapy: Secondary | ICD-10-CM

## 2024-06-26 DIAGNOSIS — G309 Alzheimer's disease, unspecified: Secondary | ICD-10-CM | POA: Diagnosis present

## 2024-06-26 DIAGNOSIS — K625 Hemorrhage of anus and rectum: Secondary | ICD-10-CM | POA: Diagnosis present

## 2024-06-26 DIAGNOSIS — Z9842 Cataract extraction status, left eye: Secondary | ICD-10-CM

## 2024-06-26 DIAGNOSIS — D6832 Hemorrhagic disorder due to extrinsic circulating anticoagulants: Secondary | ICD-10-CM | POA: Diagnosis present

## 2024-06-26 DIAGNOSIS — Z7901 Long term (current) use of anticoagulants: Secondary | ICD-10-CM | POA: Diagnosis not present

## 2024-06-26 DIAGNOSIS — D62 Acute posthemorrhagic anemia: Secondary | ICD-10-CM | POA: Diagnosis present

## 2024-06-26 DIAGNOSIS — Z961 Presence of intraocular lens: Secondary | ICD-10-CM | POA: Diagnosis present

## 2024-06-26 DIAGNOSIS — I7300 Raynaud's syndrome without gangrene: Secondary | ICD-10-CM | POA: Diagnosis present

## 2024-06-26 DIAGNOSIS — Z87440 Personal history of urinary (tract) infections: Secondary | ICD-10-CM | POA: Diagnosis not present

## 2024-06-26 DIAGNOSIS — I129 Hypertensive chronic kidney disease with stage 1 through stage 4 chronic kidney disease, or unspecified chronic kidney disease: Secondary | ICD-10-CM | POA: Diagnosis present

## 2024-06-26 DIAGNOSIS — K921 Melena: Secondary | ICD-10-CM | POA: Diagnosis not present

## 2024-06-26 DIAGNOSIS — N184 Chronic kidney disease, stage 4 (severe): Secondary | ICD-10-CM | POA: Diagnosis present

## 2024-06-26 DIAGNOSIS — K641 Second degree hemorrhoids: Secondary | ICD-10-CM | POA: Diagnosis present

## 2024-06-26 DIAGNOSIS — E8722 Chronic metabolic acidosis: Secondary | ICD-10-CM | POA: Diagnosis present

## 2024-06-26 DIAGNOSIS — Z86718 Personal history of other venous thrombosis and embolism: Secondary | ICD-10-CM

## 2024-06-26 DIAGNOSIS — D649 Anemia, unspecified: Secondary | ICD-10-CM | POA: Diagnosis not present

## 2024-06-26 LAB — CBC WITH DIFFERENTIAL/PLATELET
Abs Immature Granulocytes: 0.01 K/uL (ref 0.00–0.07)
Basophils Absolute: 0 K/uL (ref 0.0–0.1)
Basophils Relative: 1 %
Eosinophils Absolute: 0.3 K/uL (ref 0.0–0.5)
Eosinophils Relative: 5 %
HCT: 24.8 % — ABNORMAL LOW (ref 39.0–52.0)
Hemoglobin: 7.6 g/dL — ABNORMAL LOW (ref 13.0–17.0)
Immature Granulocytes: 0 %
Lymphocytes Relative: 20 %
Lymphs Abs: 0.9 K/uL (ref 0.7–4.0)
MCH: 30.6 pg (ref 26.0–34.0)
MCHC: 30.6 g/dL (ref 30.0–36.0)
MCV: 100 fL (ref 80.0–100.0)
Monocytes Absolute: 0.4 K/uL (ref 0.1–1.0)
Monocytes Relative: 8 %
Neutro Abs: 3.1 K/uL (ref 1.7–7.7)
Neutrophils Relative %: 66 %
Platelets: 212 K/uL (ref 150–400)
RBC: 2.48 MIL/uL — ABNORMAL LOW (ref 4.22–5.81)
RDW: 15.5 % (ref 11.5–15.5)
WBC: 4.7 K/uL (ref 4.0–10.5)
nRBC: 0 % (ref 0.0–0.2)

## 2024-06-26 LAB — RETICULOCYTES
Immature Retic Fract: 28.1 % — ABNORMAL HIGH (ref 2.3–15.9)
RBC.: 2.5 MIL/uL — ABNORMAL LOW (ref 4.22–5.81)
Retic Count, Absolute: 123 K/uL (ref 19.0–186.0)
Retic Ct Pct: 4.9 % — ABNORMAL HIGH (ref 0.4–3.1)

## 2024-06-26 LAB — COMPREHENSIVE METABOLIC PANEL WITH GFR
ALT: 12 U/L (ref 0–44)
AST: 20 U/L (ref 15–41)
Albumin: 3.1 g/dL — ABNORMAL LOW (ref 3.5–5.0)
Alkaline Phosphatase: 42 U/L (ref 38–126)
Anion gap: 14 (ref 5–15)
BUN: 66 mg/dL — ABNORMAL HIGH (ref 8–23)
CO2: 19 mmol/L — ABNORMAL LOW (ref 22–32)
Calcium: 8.9 mg/dL (ref 8.9–10.3)
Chloride: 111 mmol/L (ref 98–111)
Creatinine, Ser: 3.95 mg/dL — ABNORMAL HIGH (ref 0.61–1.24)
GFR, Estimated: 15 mL/min — ABNORMAL LOW (ref >60)
Glucose, Bld: 110 mg/dL — ABNORMAL HIGH (ref 70–99)
Potassium: 3.9 mmol/L (ref 3.5–5.1)
Sodium: 144 mmol/L (ref 135–145)
Total Bilirubin: 0.6 mg/dL (ref 0.0–1.2)
Total Protein: 6.3 g/dL — ABNORMAL LOW (ref 6.5–8.1)

## 2024-06-26 LAB — APTT: aPTT: 48 s — ABNORMAL HIGH (ref 24–36)

## 2024-06-26 LAB — FERRITIN: Ferritin: 49 ng/mL (ref 24–336)

## 2024-06-26 LAB — IRON AND TIBC
Iron: 32 ug/dL — ABNORMAL LOW (ref 45–182)
Saturation Ratios: 10 % — ABNORMAL LOW (ref 17.9–39.5)
TIBC: 311 ug/dL (ref 250–450)
UIBC: 279 ug/dL

## 2024-06-26 LAB — PROTIME-INR
INR: 2.5 — ABNORMAL HIGH (ref 0.8–1.2)
Prothrombin Time: 27.9 s — ABNORMAL HIGH (ref 11.4–15.2)

## 2024-06-26 LAB — PREPARE RBC (CROSSMATCH)

## 2024-06-26 LAB — HEMOGLOBIN AND HEMATOCRIT, BLOOD
HCT: 21.5 % — ABNORMAL LOW (ref 39.0–52.0)
Hemoglobin: 6.7 g/dL — ABNORMAL LOW (ref 13.0–17.0)

## 2024-06-26 MED ORDER — BISACODYL 10 MG RE SUPP: 10.0000 mg | Freq: Every day | RECTAL | Status: DC | PRN

## 2024-06-26 MED ORDER — GABAPENTIN 100 MG PO CAPS
200.0000 mg | ORAL_CAPSULE | Freq: Two times a day (BID) | ORAL | Status: DC
  Administered 2024-06-26 – 2024-06-28 (×4): 200 mg via ORAL
  Filled 2024-06-26 (×5): qty 2

## 2024-06-26 MED ORDER — ONDANSETRON HCL 4 MG PO TABS: 4.0000 mg | ORAL_TABLET | Freq: Four times a day (QID) | ORAL | Status: DC | PRN

## 2024-06-26 MED ORDER — LACTATED RINGERS IV BOLUS
500.0000 mL | Freq: Once | INTRAVENOUS | Status: AC
  Administered 2024-06-26: 500 mL via INTRAVENOUS

## 2024-06-26 MED ORDER — SODIUM CHLORIDE 0.9 % IV SOLN
300.0000 mg | Freq: Once | INTRAVENOUS | Status: AC
  Administered 2024-06-26: 300 mg via INTRAVENOUS
  Filled 2024-06-26: qty 300

## 2024-06-26 MED ORDER — SODIUM CHLORIDE 0.9% IV SOLUTION: Freq: Once | INTRAVENOUS | Status: AC

## 2024-06-26 MED ORDER — DONEPEZIL HCL 5 MG PO TABS
10.0000 mg | ORAL_TABLET | Freq: Every day | ORAL | Status: DC
  Administered 2024-06-26 – 2024-06-27 (×2): 10 mg via ORAL
  Filled 2024-06-26 (×2): qty 2

## 2024-06-26 MED ORDER — PANTOPRAZOLE SODIUM 40 MG PO TBEC
40.0000 mg | DELAYED_RELEASE_TABLET | Freq: Every day | ORAL | Status: DC
  Administered 2024-06-26 – 2024-06-27 (×2): 40 mg via ORAL
  Filled 2024-06-26 (×2): qty 1

## 2024-06-26 MED ORDER — ACETAMINOPHEN 325 MG PO TABS: 325.0000 mg | ORAL_TABLET | Freq: Four times a day (QID) | ORAL | Status: DC | PRN

## 2024-06-26 MED ORDER — ONDANSETRON HCL 4 MG/2ML IJ SOLN: 4.0000 mg | Freq: Four times a day (QID) | INTRAMUSCULAR | Status: DC | PRN

## 2024-06-26 MED ORDER — SIMVASTATIN 20 MG PO TABS
20.0000 mg | ORAL_TABLET | Freq: Every day | ORAL | Status: DC
  Administered 2024-06-26 – 2024-06-27 (×2): 20 mg via ORAL
  Filled 2024-06-26 (×2): qty 1

## 2024-06-26 MED ORDER — MEMANTINE HCL 5 MG PO TABS
10.0000 mg | ORAL_TABLET | Freq: Every day | ORAL | Status: DC
  Administered 2024-06-27 – 2024-06-28 (×2): 10 mg via ORAL
  Filled 2024-06-26 (×2): qty 2

## 2024-06-26 MED ORDER — ASPIRIN 81 MG PO TBEC
81.0000 mg | DELAYED_RELEASE_TABLET | Freq: Every day | ORAL | Status: DC
  Administered 2024-06-27 – 2024-06-28 (×2): 81 mg via ORAL
  Filled 2024-06-26 (×2): qty 1

## 2024-06-26 MED ORDER — SODIUM CHLORIDE 0.9 % IV SOLN: INTRAVENOUS | Status: AC

## 2024-06-26 MED ORDER — PEG 3350-KCL-NA BICARB-NACL 420 G PO SOLR
4000.0000 mL | Freq: Once | ORAL | Status: AC
  Administered 2024-06-26: 4000 mL via ORAL
  Filled 2024-06-26: qty 4000

## 2024-06-26 NOTE — ED Triage Notes (Signed)
 Patient states bright red rectal bleeding since this morning; recently had drop in hgb from 10 to 7. Denies abdominal pain but reports weakness.

## 2024-06-26 NOTE — ED Notes (Signed)
 Pt was able to ambulated from ED stretcher to bed w/ 1 staff assist.

## 2024-06-26 NOTE — H&P (Signed)
 History and Physical    Nathan Rivera FMW:[id number] DOB: [birth year] DOA: 06/26/2024  PCP: Jeffie Cheryl BRAVO, MD (Confirm with patient/family/NH records and if not entered, this has to be entered at [facility] point of entry) Patient coming from: Home  I have personally briefly reviewed patient's old medical records in [facility]  Chief Complaint: lower GI bleed  HPI: Nathan Rivera is a 78 y.o. male with medical history significant of AAA status post endovascular repair in 2011 on Xarelto , PAD, HTN, history of duodenal ulcer with perforation status post omental patch repair, CKD stage IV, brought in by family member for rectal bleed.  Patient has dementia, provided only limited history, most of history provided by wife at bedside.  For last 3 days patient has passed intermittent black tarry stools without any abdominal pain and this morning patient passed moderate amount of bright red blood along with blood clots x 1.  Denied any chest pain no shortness of breath.  Family took patient to see PCP who sent the patient to ED for workup.  Last time patient took Xarelto  was yesterday evening.  Family also reported that patient has been feeling lethargic lately, but denied any shortness of breath, chest pain, palpitations lightheadedness.  ED Course: Afebrile, nontachycardic blood pressure 114/68 O2 saturation 97% on room air.  Blood work showed hemoglobin 7.6 compared to baseline 8.4-8.6 over last month, BUN 66 creatinine 3.9 compared to baseline 2.0-3.9, bicarb 19K 3.9.  Review of Systems: Unable to perform, patient is demented.  Past Medical History:  Diagnosis Date   Alzheimer dementia [facility])    Aortic aneurysm    Arthritis    knees are the biggest problem   Cancer [facility]) 2012   bladder   CKD (chronic kidney disease), stage III (HCC) 2014   relative to infection, septic- renal calculi   Degenerative disc disease    Degenerative joint disease    GERD (gastroesophageal reflux disease)     History of kidney stones    Hypercholesterolemia    Hypertension    no meds currently   Impaired glucose tolerance    Muscle fatigue    muscle fatigue relative to sepsis experience    Peripheral vascular disease    Pneumonia    Raynaud's disease    Sepsis (HCC) 07/2013   Stroke (HCC)    Thromboembolism of left popilteal vein 2015   pt. unsure which leg, treating with Xarelto    UTI (urinary tract infection) 08/17/2013   Lt nephrostomy tube and stent placement    Past Surgical History:  Procedure Laterality Date   ABDOMINAL AORTIC ANEURYSM REPAIR  2011   CATARACT EXTRACTION W/PHACO Left 02/15/2021   Procedure: CATARACT EXTRACTION PHACO AND INTRAOCULAR LENS PLACEMENT (IOC) LEFT 5.59 00:44.0;  Surgeon: Myrna Adine Anes, MD;  Location: [facility];  Service: Ophthalmology;  Laterality: Left;   CATARACT EXTRACTION W/PHACO Right 03/08/2021   Procedure: CATARACT EXTRACTION PHACO AND INTRAOCULAR LENS PLACEMENT (IOC) RIGHT 1.81 00:22.7;  Surgeon: Myrna Adine Anes, MD;  Location: [facility];  Service: Ophthalmology;  Laterality: Right;   ENDOVASCULAR STENT GRAFT (AAA) N/A 04/26/2024   Procedure: ENDOVASCULAR REPAIR/STENT GRAFT;  Surgeon: Jama Cordella MATSU, MD;  Location: [facility] INVASIVE CV LAB;  Service: Cardiovascular;  Laterality: N/A;   ESOPHAGOGASTRODUODENOSCOPY N/A 04/16/2024   Procedure: EGD (ESOPHAGOGASTRODUODENOSCOPY);  Surgeon: Jinny Carmine, MD;  Location: [facility] ENDOSCOPY;  Service: Endoscopy;  Laterality: N/A;   HERNIA REPAIR     49mo old   INSERTION OF MESH N/A  10/24/2014   Procedure: INSERTION OF MESH;  Surgeon: Deward Null III, MD;  Location: [facility] OR;  Service: General;  Laterality: N/A;   LAPAROTOMY N/A 08/24/2013   Procedure: EXPLORATORY LAPAROTOMY;  Surgeon: Deward GORMAN Null DOUGLAS, MD;  Location: [facility] OR;  Service: General;  Laterality: N/A;  Gram patch closure   litrotripsy  09/24/2013   NEPHROSTOMY  Nov. 2014   nephrostomy removal  Dec. 2014   PERIPHERALLY INSERTED CENTRAL  CATHETER INSERTION     RENAL ANGIOGRAPHY Bilateral 04/19/2024   Procedure: RENAL ANGIOGRAPHY;  Surgeon: Marea Selinda GORMAN, MD;  Location: [facility] INVASIVE CV LAB;  Service: Cardiovascular;  Laterality: Bilateral;   TONSILLECTOMY     UMBILICAL HERNIA REPAIR     50 yr ago   VENTRAL HERNIA REPAIR N/A 10/24/2014   Procedure: LAPAROSCOPIC ASSISTED VENTRAL HERNIA REPAIR WITH MESH;  Surgeon: Deward Null III, MD;  Location: [facility] OR;  Service: General;  Laterality: N/A;   XI ROBOTIC ASSISTED INGUINAL HERNIA REPAIR WITH MESH Bilateral 11/17/2022   Procedure: XI ROBOTIC ASSISTED INGUINAL HERNIA REPAIR WITH MESH-Bilateral;  Surgeon: Desiderio Schanz, MD;  Location: [facility] ORS;  Service: General;  Laterality: Bilateral;     reports that he has quit smoking. His smoking use included cigarettes. He has a 40 pack-year smoking history. He has been exposed to tobacco smoke. He has never used smokeless tobacco. He reports current alcohol use of about 3.0 standard drinks of alcohol per week. He reports that he does not use drugs.  Allergies  Allergen Reactions   Duloxetine Dermatitis    Family History  Problem Relation Age of Onset   CVA Father    Atrial fibrillation Sister     Prior to Admission medications   Medication Sig Start Date End Date Taking? Authorizing Provider  acetaminophen  (TYLENOL ) 325 MG tablet Take 325 mg by mouth every 6 (six) hours as needed for mild pain.    [provider]  amLODipine  (NORVASC ) 10 MG tablet Take 1 tablet (10 mg total) by mouth daily. 04/30/24 07/29/24  Von Bellis, MD  aspirin  EC 81 MG tablet Take 1 tablet (81 mg total) by mouth daily. Swallow whole. 05/01/24   Von Bellis, MD  bisacodyl  (DULCOLAX) 10 MG suppository Place 1 suppository (10 mg total) rectally daily as needed for severe constipation. 04/30/24   Von Bellis, MD  bisacodyl  (DULCOLAX) 5 MG EC tablet Take 2 tablets (10 mg total) by mouth at bedtime. 04/30/24   Von Bellis, MD  cholecalciferol (VITAMIN D) 1000 UNITS  tablet Take 1,000 Units by mouth daily.    [provider]  donepezil  (ARICEPT ) 10 MG tablet Take 10 mg by mouth at bedtime.    [provider]  FOLIC ACID PO Take by mouth.    [provider]  gabapentin  (NEURONTIN ) 100 MG capsule 200 mg 2 (two) times daily. 200 mg in am and 200 mg in PM 04/01/17   [provider]  memantine (NAMENDA) 10 MG tablet TAKE 1 TABLET BY MOUTH TWICE DAILY FOR MEMORY LOSS 02/28/24   [provider]  pantoprazole  (PROTONIX ) 40 MG tablet Take 40 mg by mouth at bedtime.  11/13/13   [provider]  polyethylene glycol powder (GLYCOLAX /MIRALAX ) 17 GM/SCOOP powder Take 17 g by mouth 2 (two) times daily. Patient not taking: Reported on 06/14/2024 04/30/24   Von Bellis, MD  simvastatin  (ZOCOR ) 20 MG tablet Take 20 mg by mouth at bedtime.    [provider]  vitamin B-12 (CYANOCOBALAMIN ) 500 MCG tablet Take  500 mcg by mouth daily.    [provider]  XARELTO  20 MG TABS tablet Take 20 mg by mouth daily with supper. 12/23/13   [provider]    Physical Exam: Vitals:   06/26/24 0859  BP: 114/68  Pulse: 77  Resp: 20  Temp: 97.8 F (36.6 C)  TempSrc: Oral  SpO2: 97%    Constitutional: NAD, calm, comfortable Vitals:   06/26/24 0859  BP: 114/68  Pulse: 77  Resp: 20  Temp: 97.8 F (36.6 C)  TempSrc: Oral  SpO2: 97%   Eyes: PERRL, lids and conjunctivae normal ENMT: Mucous membranes are moist. Posterior pharynx clear of any exudate or lesions.Normal dentition.  Neck: normal, supple, no masses, no thyromegaly Respiratory: clear to auscultation bilaterally, no wheezing, no crackles. Normal respiratory effort. No accessory muscle use.  Cardiovascular: Regular rate and rhythm, no murmurs / rubs / gallops. No extremity edema. 2+ pedal pulses. No carotid bruits.  Abdomen: no tenderness, no masses palpated. No hepatosplenomegaly. Bowel sounds positive.  Musculoskeletal: no clubbing / cyanosis. No  joint deformity upper and lower extremities. Good ROM, no contractures. Normal muscle tone.  Skin: no rashes, lesions, ulcers. No induration Neurologic: CN 2-12 grossly intact. Sensation intact, DTR normal. Strength 5/5 in all 4.  Psychiatric: Awake, oriented to person and place, confused total time  Labs on Admission: I have personally reviewed following labs and imaging studies  CBC: Recent Labs  Lab 06/26/24 0922  WBC 4.7  NEUTROABS 3.1  HGB 7.6*  HCT 24.8*  MCV 100.0  PLT 212   Basic Metabolic Panel: Recent Labs  Lab 06/26/24 0922  NA 144  K 3.9  CL 111  CO2 19*  GLUCOSE 110*  BUN 66*  CREATININE 3.95*  CALCIUM 8.9   GFR: Estimated Creatinine Clearance: 17.1 mL/min (A) (by C-G formula based on SCr of 3.95 mg/dL (H)). Liver Function Tests: Recent Labs  Lab 06/26/24 0922  AST 20  ALT 12  ALKPHOS 42  BILITOT 0.6  PROT 6.3*  ALBUMIN  3.1*   No results for input(s): LIPASE, AMYLASE in the last 168 hours. No results for input(s): AMMONIA in the last 168 hours. Coagulation Profile: Recent Labs  Lab 06/26/24 0922  INR 2.5*   Cardiac Enzymes: No results for input(s): CKTOTAL, CKMB, CKMBINDEX, TROPONINI in the last 168 hours. BNP (last 3 results) No results for input(s): PROBNP in the last 8760 hours. HbA1C: No results for input(s): HGBA1C in the last 72 hours. CBG: No results for input(s): GLUCAP in the last 168 hours. Lipid Profile: No results for input(s): CHOL, HDL, LDLCALC, TRIG, CHOLHDL, LDLDIRECT in the last 72 hours. Thyroid Function Tests: No results for input(s): TSH, T4TOTAL, FREET4, T3FREE, THYROIDAB in the last 72 hours. Anemia Panel: No results for input(s): VITAMINB12, FOLATE, FERRITIN, TIBC, IRON, RETICCTPCT in the last 72 hours. Urine analysis:    Component Value Date/Time   COLORURINE YELLOW (A) 10/20/2022 1922   APPEARANCEUR HAZY (A) 10/20/2022 1922   APPEARANCEUR Cloudy 08/17/2013  1922   LABSPEC 1.018 10/20/2022 1922   LABSPEC 1.013 08/17/2013 1922   PHURINE 6.0 10/20/2022 1922   GLUCOSEU NEGATIVE 10/20/2022 1922   GLUCOSEU Negative 08/17/2013 1922   HGBUR NEGATIVE 10/20/2022 1922   BILIRUBINUR NEGATIVE 10/20/2022 1922   BILIRUBINUR Negative 08/17/2013 1922   KETONESUR NEGATIVE 10/20/2022 1922   PROTEINUR >=300 (A) 10/20/2022 1922   UROBILINOGEN 1.0 08/18/2013 1713   NITRITE NEGATIVE 10/20/2022 1922   LEUKOCYTESUR MODERATE (A) 10/20/2022 1922   LEUKOCYTESUR 1+ 08/17/2013  1922    Radiological Exams on Admission: No results found.  EKG: Independently reviewed.  Sinus rhythm, no acute ST changes.  Assessment/Plan Principal Problem:   Lower GI bleed  (please populate well all problems here in Problem List. (For example, if patient is on BP meds at home and you resume or decide to hold them, it is a problem that needs to be her. Same for CAD, COPD, HLD and so on)  Hematochezia Acute on chronic blood loss anemia -Clinically suspect small intestine bleeding source such as AVM, versus diverticulosis as it was shown on the previous CT without contrast.  Upper GI bleeding appears to be less likely given patient had a normal EGD study about 2 months ago. - Hold off Xarelto  - Repeat H&H this afternoon and tonight, transfuse for hemodynamic instability and/or significant drop of H&H. - Continue p.o. PPI - GI consulted. - Check iron studies and reticulocyte count, may need to consider EPO infusion.  History of PVD History of remote AAA rupture status post repair -Hold off Xarelto .  If patient will be safe to go back to Xarelto , recommend decreased dose given worsening of kidney function recently.  CKD stage IV - Euvolemic, creatinine level stable  Advanced dementia - Patient at baseline, continue Aricept  and memantine   DVT prophylaxis: SCD Code Status: Full code Family Communication: Wife at bedside Disposition Plan: Patient is sick with lower GI bleed,  requiring inpatient GI consultation, expect more than 2 midnight hospital stay. Consults called: GI Admission status: Telemetry admission   Cort ONEIDA Mana MD [facility] Pager [phone]  06/26/2024, 11:01 AM

## 2024-06-26 NOTE — ED Notes (Signed)
 Pt sleeping.

## 2024-06-26 NOTE — Consult Note (Signed)
 Nathan Copping, MD [facility]  [address], [address] [place], [place] [address] Phone: [phone] Fax : [phone]  Consultation  Referring Provider:     Dr. Laurita Primary Care Physician:  Jeffie Cheryl BRAVO, MD Primary Gastroenterologist: [facility]         Reason for Consultation:     GI bleeding  Date of Admission:  06/26/2024 Date of Consultation:  06/26/2024         HPI:   Nathan Rivera is a 78 y.o. male with a history of chronic kidney disease, AAA with recent internal iliac endoleak repair with renal artery angioplasty.  The patient has had a history of duodenal ulcer with perforation requiring omental patch.  The patient had come in back in July for a suspected upper GI bleed.  The patient had gastric polyps found but no sign of any active bleeding at that time.  The patient now comes in with his wife with a report of bright red blood per rectum.  He has had intermittent dark stool since having the vascular repair but then had recent lab work that showed his hemoglobin to have gone down.  The patient was told to have his blood work checked again whereupon his hemoglobin that was 15.5 in May went down in August to 9.5 and was up to 10.4 at the end of August and a repeat on September 26 showed the hemoglobin to be 7.0. On admission today the hemoglobin was 7.6 and the patient's wife has pictures of his bowel movement earlier this morning which was bright red blood in the toilet.  The patient's iron studies shows the iron to be 32 with a saturation of 10.  His liver enzymes were normal and he had increased reticulocyte count.  The patient had another attempt at a bowel movement early this morning without any passage of stool or blood.  He has been doing well and sleeping in the emergency department as per his wife without any complaints of fevers chills nausea vomiting or abdominal pain.  The patient has been on Xarelto  up until the day before admission.  Past Medical History:  Diagnosis Date    Alzheimer dementia [facility])    Aortic aneurysm    Arthritis    knees are the biggest problem   Cancer [facility]) 2012   bladder   CKD (chronic kidney disease), stage III (HCC) 2014   relative to infection, septic- renal calculi   Degenerative disc disease    Degenerative joint disease    GERD (gastroesophageal reflux disease)    History of kidney stones    Hypercholesterolemia    Hypertension    no meds currently   Impaired glucose tolerance    Muscle fatigue    muscle fatigue relative to sepsis experience    Peripheral vascular disease    Pneumonia    Raynaud's disease    Sepsis (HCC) 07/2013   Stroke (HCC)    Thromboembolism of left popilteal vein 2015   pt. unsure which leg, treating with Xarelto    UTI (urinary tract infection) 08/17/2013   Lt nephrostomy tube and stent placement    Past Surgical History:  Procedure Laterality Date   ABDOMINAL AORTIC ANEURYSM REPAIR  2011   CATARACT EXTRACTION W/PHACO Left 02/15/2021   Procedure: CATARACT EXTRACTION PHACO AND INTRAOCULAR LENS PLACEMENT (IOC) LEFT 5.59 00:44.0;  Surgeon: Myrna Adine Anes, MD;  Location: [facility];  Service: Ophthalmology;  Laterality: Left;   CATARACT EXTRACTION W/PHACO Right  03/08/2021   Procedure: CATARACT EXTRACTION PHACO AND INTRAOCULAR LENS PLACEMENT (IOC) RIGHT 1.81 00:22.7;  Surgeon: Myrna Adine Anes, MD;  Location: [facility];  Service: Ophthalmology;  Laterality: Right;   ENDOVASCULAR STENT GRAFT (AAA) N/A 04/26/2024   Procedure: ENDOVASCULAR REPAIR/STENT GRAFT;  Surgeon: Jama Cordella MATSU, MD;  Location: [facility] INVASIVE CV LAB;  Service: Cardiovascular;  Laterality: N/A;   ESOPHAGOGASTRODUODENOSCOPY N/A 04/16/2024   Procedure: EGD (ESOPHAGOGASTRODUODENOSCOPY);  Surgeon: Jinny Carmine, MD;  Location: [facility] ENDOSCOPY;  Service: Endoscopy;  Laterality: N/A;   HERNIA REPAIR     63mo old   INSERTION OF MESH N/A 10/24/2014   Procedure: INSERTION OF MESH;  Surgeon: Deward Null III, MD;  Location:  [facility] OR;  Service: General;  Laterality: N/A;   LAPAROTOMY N/A 08/24/2013   Procedure: EXPLORATORY LAPAROTOMY;  Surgeon: Deward GORMAN Null DOUGLAS, MD;  Location: [facility] OR;  Service: General;  Laterality: N/A;  Gram patch closure   litrotripsy  09/24/2013   NEPHROSTOMY  Nov. 2014   nephrostomy removal  Dec. 2014   PERIPHERALLY INSERTED CENTRAL CATHETER INSERTION     RENAL ANGIOGRAPHY Bilateral 04/19/2024   Procedure: RENAL ANGIOGRAPHY;  Surgeon: Marea Selinda GORMAN, MD;  Location: [facility] INVASIVE CV LAB;  Service: Cardiovascular;  Laterality: Bilateral;   TONSILLECTOMY     UMBILICAL HERNIA REPAIR     50 yr ago   VENTRAL HERNIA REPAIR N/A 10/24/2014   Procedure: LAPAROSCOPIC ASSISTED VENTRAL HERNIA REPAIR WITH MESH;  Surgeon: Deward Null III, MD;  Location: [facility] OR;  Service: General;  Laterality: N/A;   XI ROBOTIC ASSISTED INGUINAL HERNIA REPAIR WITH MESH Bilateral 11/17/2022   Procedure: XI ROBOTIC ASSISTED INGUINAL HERNIA REPAIR WITH MESH-Bilateral;  Surgeon: Desiderio Schanz, MD;  Location: [facility] ORS;  Service: General;  Laterality: Bilateral;    Prior to Admission medications   Medication Sig Start Date End Date Taking? Authorizing Provider  acetaminophen  (TYLENOL ) 325 MG tablet Take 325 mg by mouth every 6 (six) hours as needed for mild pain.   Yes [provider]  amLODipine  (NORVASC ) 10 MG tablet Take 1 tablet (10 mg total) by mouth daily. 04/30/24 07/29/24 Yes Von Bellis, MD  aspirin  EC 81 MG tablet Take 1 tablet (81 mg total) by mouth daily. Swallow whole. 05/01/24  Yes Von Bellis, MD  bisacodyl  (DULCOLAX) 5 MG EC tablet Take 2 tablets (10 mg total) by mouth at bedtime. Patient taking differently: Take 10 mg by mouth daily as needed for moderate constipation. 04/30/24  Yes Von Bellis, MD  cholecalciferol (VITAMIN D) 1000 UNITS tablet Take 1,000 Units by mouth daily.   Yes [provider]  donepezil  (ARICEPT ) 10 MG tablet Take 10 mg by mouth at bedtime.   Yes [provider]  FOLIC ACID  PO Take by mouth.   Yes [provider]  gabapentin  (NEURONTIN ) 100 MG capsule 200 mg 2 (two) times daily. 200 mg in am and 200 mg in PM 04/01/17  Yes [provider]  memantine (NAMENDA) 10 MG tablet TAKE 1 TABLET BY MOUTH TWICE DAILY FOR MEMORY LOSS 02/28/24  Yes [provider]  pantoprazole  (PROTONIX ) 40 MG tablet Take 40 mg by mouth at bedtime.  11/13/13  Yes [provider]  simvastatin  (ZOCOR ) 20 MG tablet Take 20 mg by mouth at bedtime.   Yes [provider]  vitamin B-12 (CYANOCOBALAMIN ) 500 MCG tablet Take 500 mcg by mouth daily.   Yes [provider]  XARELTO  20 MG TABS tablet Take 20 mg by mouth daily  with supper. 12/23/13  Yes [provider]  amLODipine  (NORVASC ) 5 MG tablet Take 5 mg by mouth daily. Patient not taking: Reported on 06/26/2024    [provider]  bisacodyl  (DULCOLAX) 10 MG suppository Place 1 suppository (10 mg total) rectally daily as needed for severe constipation. Patient not taking: Reported on 06/26/2024 04/30/24   Von Bellis, MD  polyethylene glycol powder (GLYCOLAX /MIRALAX ) 17 GM/SCOOP powder Take 17 g by mouth 2 (two) times daily. Patient not taking: No sig reported 04/30/24   Von Bellis, MD    Family History  Problem Relation Age of Onset   CVA Father    Atrial fibrillation Sister      Social History   Tobacco Use   Smoking status: Former    Current packs/day: 1.00    Average packs/day: 1 pack/day for 40.0 years (40.0 ttl pk-yrs)    Types: Cigarettes    Passive exposure: Past   Smokeless tobacco: Never   Tobacco comments:    since age 66  Vaping Use   Vaping status: Never Used  Substance Use Topics   Alcohol use: Yes    Alcohol/week: 3.0 standard drinks of alcohol    Types: 3 Glasses of wine per week   Drug use: No    Allergies as of 06/26/2024 - Review Complete 06/26/2024  Allergen Reaction Noted   Duloxetine Dermatitis 01/26/2024    Review of Systems:    All  systems reviewed and negative except where noted in HPI.   Physical Exam:  Vital signs in last 24 hours: Temp:  [97.8 F (36.6 C)] 97.8 F (36.6 C) (10/01 0859) Pulse Rate:  [58-77] 77 (10/01 1230) Resp:  [12-20] 14 (10/01 1230) BP: (114-168)/(68-81) 168/81 (10/01 1200) SpO2:  [97 %-100 %] 100 % (10/01 1230)   General:   Pleasant, cooperative in NAD Head:  Normocephalic and atraumatic. Eyes:   No icterus.   Conjunctiva pink. PERRLA. Ears:  Normal auditory acuity. Neck:  Supple; no masses or thyroidomegaly Rectal:  Not performed. Msk:  Symmetrical without gross deformities.    Extremities:  Without edema, cyanosis or clubbing. Neurologic: Patient is sleeping at the time of my interview Skin:  Intact without significant lesions or rashes.   LAB RESULTS: Recent Labs    06/26/24 0922  WBC 4.7  HGB 7.6*  HCT 24.8*  PLT 212   BMET Recent Labs    06/26/24 0922  NA 144  K 3.9  CL 111  CO2 19*  GLUCOSE 110*  BUN 66*  CREATININE 3.95*  CALCIUM 8.9   LFT Recent Labs    06/26/24 0922  PROT 6.3*  ALBUMIN  3.1*  AST 20  ALT 12  ALKPHOS 42  BILITOT 0.6   PT/INR Recent Labs    06/26/24 0922  LABPROT 27.9*  INR 2.5*    STUDIES: No results found.    Impression / Plan:   Assessment: Principal Problem:   Lower GI bleed   Nathan Rivera is a 78 y.o. y/o male with with bright red blood per rectum this morning with a drop in hemoglobin.  The patient has not had any further bleeding and his hemoglobin on admission was 7.6 which was slightly up from his previous hemoglobin of 7.0 on September 26.  Plan:  The patient should undergo a colonoscopy due to the patient's anemia and rectal bleeding.  The patient had an upper endoscopy back in July that did not show any sign of active bleeding.  The patient cannot have a  CT angiography due to his impaired renal function and a bleeding scan is unlikely to be positive since the patient is not bleeding at the present time.   The patient will be prepped today for a colonoscopy for tomorrow with Dr. Therisa who will be covering.  If the patient is not able to take the prep today then giving him the prep tomorrow we will make the procedure delayed till Friday.  The patient's wife has been explained the plan and agrees with it.  Thank you for involving me in the care of this patient.      LOS: 0 days   Nathan Copping, MD, MD. NOLIA 06/26/2024, 1:50 PM,  Pager [phone] 7am-5pm  Check AMION for 5pm -7am coverage and on weekends   Note: This dictation was prepared with Dragon dictation along with smaller phrase technology. Any transcriptional errors that result from this process are unintentional.

## 2024-06-26 NOTE — ED Provider Notes (Signed)
 [facility] Provider Note    Event Date/Time   First MD Initiated Contact with Patient 06/26/24 [phone]     (approximate)   History   Rectal Bleeding   HPI  Nathan Rivera is a 78 y.o. male who presents to the ED for evaluation of Rectal Bleeding   Reviewed PCP visit from 9/25.  History of CKD, PAD and AAA with recent internal iliac endoleak repair, renal artery angioplasty in August.  History of duodenal ulcer perforation requiring omental patch repair on PPI.  Remains on Xarelto  Labs on PCP visit from a few days ago with hemoglobin of 7 down from ten 1 month ago.  Patient presents with his wife for evaluation of rectal bleeding and increasing weakness.  She helps the patient with all bowel movements and reports 1 episode of bright red blood per rectum earlier this morning, which is unusual and first.  Progressively worsening weakness.  Had to call EMS for lift assist a few days ago, denies any significant injury.  Patient denies any pain or discomfort such as abdominal pain, back pain, chest pain   Physical Exam   Triage Vital Signs: ED Triage Vitals [06/26/24 0859]  Encounter Vitals Group     BP 114/68     Girls Systolic BP Percentile      Girls Diastolic BP Percentile      Boys Systolic BP Percentile      Boys Diastolic BP Percentile      Pulse Rate 77     Resp 20     Temp 97.8 F (36.6 C)     Temp Source Oral     SpO2 97 %     Weight      Height      Head Circumference      Peak Flow      Pain Score 0     Pain Loc      Pain Education      Exclude from Growth Chart     Most recent vital signs: Vitals:   06/26/24 0859  BP: 114/68  Pulse: 77  Resp: 20  Temp: 97.8 F (36.6 C)  SpO2: 97%    General: Awake, no distress.  CV:  Good peripheral perfusion.  Resp:  Normal effort.  Abd:  No distention.  Soft and nontender MSK:  No deformity noted.  Neuro:  No focal deficits appreciated. Other:     ED Results / Procedures /  Treatments   Labs (all labs ordered are listed, but only abnormal results are displayed) Labs Reviewed  PROTIME-INR - Abnormal; Notable for the following components:      Result Value   Prothrombin Time 27.9 (*)    INR 2.5 (*)    All other components within normal limits  APTT - Abnormal; Notable for the following components:   aPTT 48 (*)    All other components within normal limits  CBC WITH DIFFERENTIAL/PLATELET - Abnormal; Notable for the following components:   RBC 2.48 (*)    Hemoglobin 7.6 (*)    HCT 24.8 (*)    All other components within normal limits  COMPREHENSIVE METABOLIC PANEL WITH GFR - Abnormal; Notable for the following components:   CO2 19 (*)    Glucose, Bld 110 (*)    BUN 66 (*)    Creatinine, Ser 3.95 (*)    Total Protein 6.3 (*)    Albumin  3.1 (*)    GFR, Estimated 15 (*)    All  other components within normal limits  IRON AND TIBC  RETICULOCYTES  FERRITIN  HEMOGLOBIN AND HEMATOCRIT, BLOOD  HEMOGLOBIN AND HEMATOCRIT, BLOOD  TYPE AND SCREEN    EKG Sinus rhythm with a rate of 63 bpm.  Normal axis and intervals without clear signs of acute ischemia.  RADIOLOGY   Official radiology report(s): No results found.  PROCEDURES and INTERVENTIONS:  .1-3 Lead EKG Interpretation  Performed by: Claudene Rover, MD Authorized by: Claudene Rover, MD     Interpretation: normal     ECG rate:  76   ECG rate assessment: normal     Rhythm: sinus rhythm     Ectopy: none     Conduction: normal     Medications  lactated ringers  bolus 500 mL (500 mLs Intravenous New Bag/Given 06/26/24 1033)     IMPRESSION / MDM / ASSESSMENT AND PLAN / ED COURSE  I reviewed the triage vital signs and the nursing notes.  Differential diagnosis includes, but is not limited to, blood loss anemia, diverticular bleed, hemorrhoidal bleed, brisk upper GI bleed, AKI, aortic enteric fistula  {Patient presents with symptoms of an acute illness or injury that is potentially  life-threatening.  Patient presents with lower GI bleeding and low hemoglobin requiring medical admission.  Hemoglobin remains around 7 at the threshold for transfusion.  He is stable without tachycardia or shock.  Worsening renal dysfunction for which she receives 500 cc of LR.  No abdominal pain or tenderness.  Consult medicine for admission  Clinical Course as of 06/26/24 1059  Wed Jun 26, 2024  1010 Reassessed, discussed workup and plan of care [DS]  1022 Consult with medicine who agrees to admit [DS]    Clinical Course User Index [DS] Claudene Rover, MD     FINAL CLINICAL IMPRESSION(S) / ED DIAGNOSES   Final diagnoses:  Lower GI bleed     Rx / DC Orders   ED Discharge Orders     None        Note:  This document was prepared using Dragon voice recognition software and may include unintentional dictation errors.   Claudene Rover, MD 06/26/24 1100

## 2024-06-27 DIAGNOSIS — G309 Alzheimer's disease, unspecified: Secondary | ICD-10-CM

## 2024-06-27 DIAGNOSIS — I10 Essential (primary) hypertension: Secondary | ICD-10-CM

## 2024-06-27 DIAGNOSIS — D649 Anemia, unspecified: Secondary | ICD-10-CM

## 2024-06-27 DIAGNOSIS — K922 Gastrointestinal hemorrhage, unspecified: Secondary | ICD-10-CM | POA: Diagnosis not present

## 2024-06-27 DIAGNOSIS — I739 Peripheral vascular disease, unspecified: Secondary | ICD-10-CM

## 2024-06-27 DIAGNOSIS — E7800 Pure hypercholesterolemia, unspecified: Secondary | ICD-10-CM

## 2024-06-27 DIAGNOSIS — N184 Chronic kidney disease, stage 4 (severe): Secondary | ICD-10-CM | POA: Diagnosis not present

## 2024-06-27 DIAGNOSIS — K219 Gastro-esophageal reflux disease without esophagitis: Secondary | ICD-10-CM

## 2024-06-27 DIAGNOSIS — F0280 Dementia in other diseases classified elsewhere without behavioral disturbance: Secondary | ICD-10-CM

## 2024-06-27 LAB — RENAL FUNCTION PANEL
Albumin: 2.8 g/dL — ABNORMAL LOW (ref 3.5–5.0)
Anion gap: 10 (ref 5–15)
BUN: 49 mg/dL — ABNORMAL HIGH (ref 8–23)
CO2: 21 mmol/L — ABNORMAL LOW (ref 22–32)
Calcium: 8.5 mg/dL — ABNORMAL LOW (ref 8.9–10.3)
Chloride: 112 mmol/L — ABNORMAL HIGH (ref 98–111)
Creatinine, Ser: 3.22 mg/dL — ABNORMAL HIGH (ref 0.61–1.24)
GFR, Estimated: 19 mL/min — ABNORMAL LOW (ref >60)
Glucose, Bld: 84 mg/dL (ref 70–99)
Phosphorus: 3.4 mg/dL (ref 2.5–4.6)
Potassium: 4 mmol/L (ref 3.5–5.1)
Sodium: 143 mmol/L (ref 135–145)

## 2024-06-27 LAB — BPAM RBC
Blood Product Expiration Date: 202510252359
ISSUE DATE / TIME: 202510012041
Unit Type and Rh: 6200

## 2024-06-27 LAB — TYPE AND SCREEN
ABO/RH(D): A POS
Antibody Screen: NEGATIVE
Unit division: 0

## 2024-06-27 LAB — HEMOGLOBIN AND HEMATOCRIT, BLOOD
HCT: 24.3 % — ABNORMAL LOW (ref 39.0–52.0)
Hemoglobin: 7.7 g/dL — ABNORMAL LOW (ref 13.0–17.0)

## 2024-06-27 MED ORDER — AMLODIPINE BESYLATE 10 MG PO TABS
10.0000 mg | ORAL_TABLET | Freq: Every day | ORAL | Status: DC
Start: 2024-06-27 — End: 2024-06-28
  Administered 2024-06-27 – 2024-06-28 (×2): 10 mg via ORAL
  Filled 2024-06-27 (×2): qty 1

## 2024-06-27 MED ORDER — SODIUM CHLORIDE 0.9 % IV SOLN
300.0000 mg | Freq: Once | INTRAVENOUS | Status: AC
  Administered 2024-06-27: 300 mg via INTRAVENOUS
  Filled 2024-06-27: qty 15

## 2024-06-27 MED ORDER — PEG 3350-KCL-NA BICARB-NACL 420 G PO SOLR
4000.0000 mL | Freq: Once | ORAL | Status: AC
  Administered 2024-06-27: 4000 mL via ORAL
  Filled 2024-06-27: qty 4000

## 2024-06-27 MED ORDER — DARBEPOETIN ALFA 40 MCG/0.4ML IJ SOSY
40.0000 ug | PREFILLED_SYRINGE | Freq: Once | INTRAMUSCULAR | Status: AC
  Administered 2024-06-27: 40 ug via SUBCUTANEOUS
  Filled 2024-06-27: qty 0.4

## 2024-06-27 MED ORDER — PROPOFOL 1000 MG/100ML IV EMUL
INTRAVENOUS | Status: AC
  Filled 2024-06-27: qty 100

## 2024-06-27 MED ORDER — LIDOCAINE HCL (PF) 2 % IJ SOLN
INTRAMUSCULAR | Status: AC
  Filled 2024-06-27: qty 5

## 2024-06-27 NOTE — Assessment & Plan Note (Signed)
-   Continue home statin

## 2024-06-27 NOTE — Assessment & Plan Note (Signed)
 Seems like having AKI with underlying CKD stage IV, creatinine with some improvement to 3.22 today. -Monitor renal function -Avoid nephrotoxins

## 2024-06-27 NOTE — Plan of Care (Signed)
   Problem: Clinical Measurements: Goal: Will remain free from infection Outcome: Progressing Goal: Respiratory complications will improve Outcome: Progressing Goal: Cardiovascular complication will be avoided Outcome: Progressing   Problem: Activity: Goal: Risk for activity intolerance will decrease Outcome: Progressing   Problem: Nutrition: Goal: Adequate nutrition will be maintained Outcome: Progressing

## 2024-06-27 NOTE — Progress Notes (Signed)
 Colonoscopy canceled for today. Dr. Caleen asked for a diet order (Clears). Okay pe provider for pt to have clears. Order placed.

## 2024-06-27 NOTE — Assessment & Plan Note (Signed)
 No more active bleeding but hemoglobin recorded as low as 6.7 s/p 1 unit of PRBC and now at 7.7.  GI was consulted and original plan was to do colonoscopy today which got postponed until tomorrow due to incomplete prep. - Monitor for any further abnormal bleeding -Monitor hemoglobin -Continue with clear liquid diet today -Colonoscopy prep

## 2024-06-27 NOTE — Progress Notes (Signed)
 Progress Note   Patient: Nathan Rivera FMW:[id number] DOB: 02/19/1946 DOA: 06/26/2024     1 DOS: the patient was seen and examined on 06/27/2024   Brief hospital course: Partly taken from H&P.  Nathan Rivera is a 78 y.o. male with medical history significant of AAA status post endovascular repair in 2011 on Xarelto , PAD, HTN, history of duodenal ulcer with perforation status post omental patch repair, CKD stage IV, brought in by family member for rectal bleed.  Last dose of Xarelto  was on 06/25/2024.  Patient has an history of concern of upper GI bleed in July 2025 s/p EGD which found gastric polyps and no active bleeding.  On presentation hemodynamically stable, labs with hemoglobin of 7.6, gradually worsening secondary, patient 66, creatinine 3.9, CO2 19.  GI was consulted and they are planning to do colonoscopy on 06/27/2024  10/2: Mildly elevated blood pressure at 162/75, hemoglobin at 7.7 s/p 1 unit of PRBC, iron studies with low iron and saturation, normal ferritin and  increased reticulocyte count.  Giving a dose of IV iron and Aranesp. Colonoscopy got canceled due to incomplete prep, likely be done tomorrow morning.  Assessment and Plan: * Lower GI bleed No more active bleeding but hemoglobin recorded as low as 6.7 s/p 1 unit of PRBC and now at 7.7.  GI was consulted and original plan was to do colonoscopy today which got postponed until tomorrow due to incomplete prep. - Monitor for any further abnormal bleeding -Monitor hemoglobin -Continue with clear liquid diet today -Colonoscopy prep  Normocytic anemia Seems multifactorial, anemia of chronic disease secondary to CKD with some iron deficiency. -Ordered 1 dose of IV iron and Aranesp -Continue to monitor -Transfuse if below 7  CKD (chronic kidney disease), stage IV (HCC) Seems like having AKI with underlying CKD stage IV, creatinine with some improvement to 3.22 today. -Monitor renal function -Avoid  nephrotoxins  Hypertension Blood pressure elevated. -Restarting home amlodipine  -As needed hydralazine   Peripheral vascular disease Patient also has an history of remote AAA rupture status postrepair. - Home Xarelto  is being held -Continue with statin  Alzheimer dementia (HCC) - Continue home Aricept  and memantine  Hypercholesterolemia - Continue home statin  GERD (gastroesophageal reflux disease) - Continue with PPI   Subjective: Patient was seen and examined today.  Denies any further bleeding.  No pain.  Physical Exam: Vitals:   06/26/24 2115 06/26/24 2342 06/27/24 0600 06/27/24 0737  BP: (!) 168/85 (!) 185/87 (!) 169/91 (!) 162/75  Pulse: 60 62 60 65  Resp: 15 16 15 14   Temp: 98.3 F (36.8 C) 98.1 F (36.7 C) 97.6 F (36.4 C) 97.9 F (36.6 C)  TempSrc: Axillary Oral Oral Oral  SpO2: 97% 99% 95% 97%  Weight:      Height:       General.  Frail elderly man, in no acute distress. Pulmonary.  Lungs clear bilaterally, normal respiratory effort. CV.  Regular rate and rhythm, no JVD, rub or murmur. Abdomen.  Soft, nontender, nondistended, BS positive. CNS.  Alert and oriented .  No focal neurologic deficit. Extremities.  No edema, no cyanosis, pulses intact and symmetrical.  Data Reviewed: Prior data reviewed  Family Communication: Discussed with wife at bedside  Disposition: Status is: Inpatient Remains inpatient appropriate because: Severity of illness, going for colonoscopy tomorrow  Planned Discharge Destination: Home  DVT prophylaxis.  SCDs Time spent: 50 minutes  This record has been created using Conservation officer, historic buildings. Errors have been sought and corrected,but may  not always be located. Such creation errors do not reflect on the standard of care.   Author: Amaryllis Dare, MD 06/27/2024 12:25 PM  For on call review [email or web address].

## 2024-06-27 NOTE — Plan of Care (Signed)

## 2024-06-27 NOTE — Hospital Course (Addendum)
 Partly taken from H&P.  Nathan Rivera is a 78 y.o. male with medical history significant of AAA status post endovascular repair in 2011 on Xarelto , PAD, HTN, history of duodenal ulcer with perforation status post omental patch repair, CKD stage IV, brought in by family member for rectal bleed.  Last dose of Xarelto  was on 06/25/2024.  Patient has an history of concern of upper GI bleed in July 2025 s/p EGD which found gastric polyps and no active bleeding.  On presentation hemodynamically stable, labs with hemoglobin of 7.6, gradually worsening secondary, patient 66, creatinine 3.9, CO2 19.  GI was consulted and they are planning to do colonoscopy on 06/27/2024  10/2: Mildly elevated blood pressure at 162/75, hemoglobin at 7.7 s/p 1 unit of PRBC, iron studies with low iron and saturation, normal ferritin and  increased reticulocyte count.  Giving a dose of IV iron and Aranesp. Colonoscopy got canceled due to incomplete prep, likely be done tomorrow morning.  10/3: Hemodynamically stable, s/p colonoscopy with shows hemorrhoids and diverticulosis.  His home Xarelto  dose was decreased to 15 mg based on his renal function.  Patient will continue with his current medications which include decreased dose of Xarelto .  He need to have a close follow-up with his providers which would include his nephrologist as he might be approaching dialysis.  Renal function currently stable.

## 2024-06-27 NOTE — Assessment & Plan Note (Signed)
 Patient also has an history of remote AAA rupture status postrepair. - Home Xarelto  is being held -Continue with statin

## 2024-06-27 NOTE — Assessment & Plan Note (Signed)
 Seems multifactorial, anemia of chronic disease secondary to CKD with some iron deficiency. -Ordered 1 dose of IV iron and Aranesp -Continue to monitor -Transfuse if below 7

## 2024-06-27 NOTE — TOC Initial Note (Signed)
 Transition of Care [facility]) - Initial/Assessment Note    Patient Details  Name: Nathan Rivera MRN: [id number] Date of Birth: 01-26-1946  [facility] CM/SW Contact:    Corean ONEIDA Haddock, RN Phone Number: 06/27/2024, 3:10 PM  Clinical Narrative:                  Patient admitted for GI bleed  Per chart review patient lives at home with wife. Has RW, BSC, and built in shower seat.  Confirmed with Channing with [facility] that patient is active for PT and OT        Patient Goals and CMS Choice            Expected Discharge Plan and Services                                              Prior Living Arrangements/Services                       Activities of Daily Living      Permission Sought/Granted                  Emotional Assessment              Admission diagnosis:  Lower GI bleed [K92.2] Patient Active Problem List   Diagnosis Date Noted   CKD (chronic kidney disease), stage IV (HCC) 06/27/2024   Normocytic anemia 06/27/2024   Alzheimer dementia (HCC)    GERD (gastroesophageal reflux disease)    Lower GI bleed 06/26/2024   Bradycardia 05/08/2024   Renal artery stenosis 04/18/2024   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   Epigastric pain 04/16/2024   Hematemesis without nausea 04/16/2024   Acute renal insufficiency 04/16/2024   S/P AAA repair 04/16/2024   Major hemoptysis 04/16/2024   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   Recurrent right inguinal hernia 11/17/2022   Left inguinal hernia 11/17/2022   S/P bilateral inguinal hernia repair 11/17/2022   Postoperative nausea and vomiting 11/17/2022   Osteoarthrosis involving lower leg 09/11/2019   Pure hypercholesterolemia 09/11/2019   Raynaud disease 12/19/2018   Ulcer of finger 12/17/2018   Abdominal aneurysm 04/03/2017   Ventral incisional hernia 10/24/2014   Duodenal ulcer 08/18/2014   Impaired glucose tolerance 08/18/2014   Epididymitis 03/10/2014    Pyelonephritis 02/24/2014   Perforated gastric ulcer (HCC) 09/20/2013   Renal abscess 08/20/2013   Acute respiratory failure with hypoxia (HCC) 08/20/2013   Dehydration 08/20/2013   Bacteremia due to ESBL Escherichia coli 08/20/2013   Severe sepsis (HCC) 08/18/2013   Acute renal failure 08/18/2013   Hypotension, unspecified 08/18/2013   Hydronephrosis, left 08/18/2013   Nephrolithiasis 08/18/2013   Hematuria 08/08/2013   History of bladder cancer 11/20/2012   Hypertrophy of prostate with urinary obstruction and other lower urinary tract symptoms (LUTS) 11/20/2012   Chronic kidney disease, stage III (moderate) (HCC) 11/05/2012   Increased frequency of urination 11/05/2012   Urinary urgency 11/05/2012   Peripheral vascular disease 08/29/2011   Hypertension 08/29/2011   Abnormal electrocardiogram 08/29/2011   Preoperative cardiovascular examination 08/29/2011   Hypercholesterolemia 08/29/2011   Malaise 08/29/2011   PCP:  Jeffie Cheryl BRAVO, MD Pharmacy:   [facility] [phone] [address], [place] - [address] [place] [address] Phone: [phone] Fax: [phone]  [facility] [place]  [facility] [address] [place] [address] Phone: [phone] Fax: [phone]     Social Drivers of Health (SDOH) Social History: SDOH Screenings   Food Insecurity: No Food Insecurity (06/27/2024)  Housing: Unknown (06/27/2024)  Transportation Needs: No Transportation Needs (06/27/2024)  Utilities: Not At Risk (06/27/2024)  Financial Resource Strain: Low Risk  (06/20/2024)   Received from [facility]  Social Connections: Unknown (06/27/2024)  Recent Concern: Social Connections - Moderately Isolated (05/09/2024)  Tobacco Use: Medium Risk (06/26/2024)   SDOH Interventions:     Readmission Risk Interventions    06/27/2024    3:10 PM 05/10/2024   12:01 PM  Readmission Risk Prevention Plan  Transportation Screening   Complete  PCP or Specialist Appt within 3-5 Days  Complete  HRI or Home Care Consult  Complete  Social Work Consult for Recovery Care Planning/Counseling  Complete  Palliative Care Screening  Not Applicable  Medication Review Oceanographer) Complete Complete  HRI or Home Care Consult Complete   Palliative Care Screening Not Applicable   Skilled Nursing Facility Not Applicable

## 2024-06-27 NOTE — Progress Notes (Signed)
 GI note  Informed by nursing on the floor that the patient's stool was dark brown in color not yet cleaned out procedure canceled for today procedure be rescheduled for tomorrow with additional prep ordered for this afternoon.  Would also recommend checking INR as it was elevated over 2.5 yesterday.

## 2024-06-27 NOTE — Assessment & Plan Note (Signed)
 Blood pressure elevated. -Restarting home amlodipine  -As needed hydralazine 

## 2024-06-27 NOTE — Assessment & Plan Note (Signed)
-   Continue home Aricept  and memantine

## 2024-06-27 NOTE — Assessment & Plan Note (Signed)
 Continue with PPI

## 2024-06-28 ENCOUNTER — Inpatient Hospital Stay: Admitting: Anesthesiology

## 2024-06-28 ENCOUNTER — Encounter: Payer: Self-pay | Admitting: Internal Medicine

## 2024-06-28 ENCOUNTER — Encounter
Admission: EM | Disposition: A | Discharge status: Home / Self Care | Payer: Self-pay | Source: Home / Self Care | Attending: Internal Medicine

## 2024-06-28 ENCOUNTER — Other Ambulatory Visit: Payer: Self-pay

## 2024-06-28 DIAGNOSIS — N184 Chronic kidney disease, stage 4 (severe): Secondary | ICD-10-CM | POA: Diagnosis not present

## 2024-06-28 DIAGNOSIS — D649 Anemia, unspecified: Secondary | ICD-10-CM | POA: Diagnosis not present

## 2024-06-28 DIAGNOSIS — K5730 Diverticulosis of large intestine without perforation or abscess without bleeding: Secondary | ICD-10-CM

## 2024-06-28 DIAGNOSIS — K922 Gastrointestinal hemorrhage, unspecified: Secondary | ICD-10-CM | POA: Diagnosis not present

## 2024-06-28 DIAGNOSIS — K641 Second degree hemorrhoids: Secondary | ICD-10-CM

## 2024-06-28 DIAGNOSIS — I10 Essential (primary) hypertension: Secondary | ICD-10-CM | POA: Diagnosis not present

## 2024-06-28 HISTORY — PX: COLONOSCOPY: SHX5424

## 2024-06-28 LAB — CBC
HCT: 24.4 % — ABNORMAL LOW (ref 39.0–52.0)
Hemoglobin: 7.7 g/dL — ABNORMAL LOW (ref 13.0–17.0)
MCH: 30.4 pg (ref 26.0–34.0)
MCHC: 31.6 g/dL (ref 30.0–36.0)
MCV: 96.4 fL (ref 80.0–100.0)
Platelets: 187 K/uL (ref 150–400)
RBC: 2.53 MIL/uL — ABNORMAL LOW (ref 4.22–5.81)
RDW: 15.3 % (ref 11.5–15.5)
WBC: 5.3 K/uL (ref 4.0–10.5)
nRBC: 0 % (ref 0.0–0.2)

## 2024-06-28 LAB — RENAL FUNCTION PANEL
Albumin: 2.9 g/dL — ABNORMAL LOW (ref 3.5–5.0)
Anion gap: 8 (ref 5–15)
BUN: 40 mg/dL — ABNORMAL HIGH (ref 8–23)
CO2: 21 mmol/L — ABNORMAL LOW (ref 22–32)
Calcium: 8.5 mg/dL — ABNORMAL LOW (ref 8.9–10.3)
Chloride: 113 mmol/L — ABNORMAL HIGH (ref 98–111)
Creatinine, Ser: 3.24 mg/dL — ABNORMAL HIGH (ref 0.61–1.24)
GFR, Estimated: 19 mL/min — ABNORMAL LOW (ref >60)
Glucose, Bld: 71 mg/dL (ref 70–99)
Phosphorus: 3.2 mg/dL (ref 2.5–4.6)
Potassium: 3.6 mmol/L (ref 3.5–5.1)
Sodium: 142 mmol/L (ref 135–145)

## 2024-06-28 LAB — PROTIME-INR
INR: 1.1 (ref 0.8–1.2)
Prothrombin Time: 15.1 s (ref 11.4–15.2)

## 2024-06-28 SURGERY — COLONOSCOPY
Anesthesia: General

## 2024-06-28 MED ORDER — RIVAROXABAN 15 MG PO TABS
15.0000 mg | ORAL_TABLET | Freq: Every day | ORAL | 0 refills | Status: AC
  Filled 2024-06-28: qty 7, 7d supply, fill #0

## 2024-06-28 MED ORDER — PROPOFOL 1000 MG/100ML IV EMUL
INTRAVENOUS | Status: AC
  Filled 2024-06-28: qty 200

## 2024-06-28 MED ORDER — GLYCOPYRROLATE 0.2 MG/ML IJ SOLN
INTRAMUSCULAR | Status: AC
  Filled 2024-06-28: qty 1

## 2024-06-28 MED ORDER — FERROUS SULFATE 325 (65 FE) MG PO TBEC: 325.0000 mg | DELAYED_RELEASE_TABLET | Freq: Every day | ORAL | 3 refills | Status: AC

## 2024-06-28 MED ORDER — RIVAROXABAN 15 MG PO TABS: 15.0000 mg | ORAL_TABLET | Freq: Every day | ORAL | 0 refills | Status: AC

## 2024-06-28 MED ORDER — RIVAROXABAN 15 MG PO TABS
15.0000 mg | ORAL_TABLET | Freq: Every day | ORAL | Status: DC
  Filled 2024-06-28: qty 1

## 2024-06-28 MED ORDER — PROPOFOL 10 MG/ML IV BOLUS
INTRAVENOUS | Status: DC | PRN
  Administered 2024-06-28 (×2): 40 mg via INTRAVENOUS

## 2024-06-28 MED ORDER — LIDOCAINE HCL (CARDIAC) PF 100 MG/5ML IV SOSY
PREFILLED_SYRINGE | INTRAVENOUS | Status: DC | PRN
  Administered 2024-06-28: 60 mg via INTRAVENOUS

## 2024-06-28 MED ORDER — PROPOFOL 500 MG/50ML IV EMUL
INTRAVENOUS | Status: DC | PRN
  Administered 2024-06-28: 50 ug/kg/min via INTRAVENOUS

## 2024-06-28 MED ORDER — SODIUM CHLORIDE 0.9 % IV SOLN: INTRAVENOUS | Status: DC

## 2024-06-28 MED ORDER — LIDOCAINE HCL (PF) 2 % IJ SOLN
INTRAMUSCULAR | Status: AC
  Filled 2024-06-28: qty 5

## 2024-06-28 NOTE — Transfer of Care (Signed)
 Immediate Anesthesia Transfer of Care Note  Patient: Nathan Rivera  Procedure(s) Performed: COLONOSCOPY  Patient Location: PACU  Anesthesia Type:General  Level of Consciousness: sedated  Airway & Oxygen Therapy: Patient Spontanous Breathing  Post-op Assessment: Report given to RN and Post -op Vital signs reviewed and stable  Post vital signs: Reviewed and stable  Last Vitals:  Vitals Value Taken Time  BP 132/69 06/28/24 07:56  Temp    Pulse 52 06/28/24 07:57  Resp 16 06/28/24 07:57  SpO2 98 % 06/28/24 07:57  Vitals shown include unfiled device data.  Last Pain:  Vitals:   06/28/24 0339  TempSrc: Oral  PainSc:          Complications: No notable events documented.

## 2024-06-28 NOTE — Op Note (Signed)
 [facility] Patient Name: Nathan Rivera Procedure Date: 06/28/2024 7:33 AM MRN: [id number] Account #: [id number] Date of Birth: 1945/12/22 Admit Type: Inpatient Age: 78 Room: [facility] ENDO ROOM 3 Gender: Male Note Status: Finalized Instrument Name: Colon Scope [id number] Procedure:             Colonoscopy Indications:           Hematochezia Providers:             [name] MD, MD Referring MD:          Cheryl CHARLENA Jericho (Referring MD) Medicines:             Propofol  per Anesthesia Complications:         No immediate complications. Procedure:             Pre-Anesthesia Assessment:                        - Prior to the procedure, a History and Physical was                         performed, and patient medications and allergies were                         reviewed. The patient's tolerance of previous                         anesthesia was also reviewed. The risks and benefits                         of the procedure and the sedation options and risks                         were discussed with the patient. All questions were                         answered, and informed consent was obtained. Prior                         Anticoagulants: The patient has taken no anticoagulant                         or antiplatelet agents. ASA Grade Assessment: II - A                         patient with mild systemic disease. After reviewing                         the risks and benefits, the patient was deemed in                         satisfactory condition to undergo the procedure.                        After obtaining informed consent, the colonoscope was                         passed under direct vision. Throughout the procedure,  the patient's blood pressure, pulse, and oxygen                         saturations were monitored continuously. The                         Colonoscope was introduced through the anus and                          advanced to the the cecum, identified by appendiceal                         orifice and ileocecal valve. The colonoscopy was                         performed without difficulty. The patient tolerated                         the procedure well. The quality of the bowel                         preparation was good. Findings:      The perianal and digital rectal examinations were normal.      Multiple small-mouthed diverticula were found in the entire colon.      Non-bleeding internal hemorrhoids were found during retroflexion. The       hemorrhoids were Grade II (internal hemorrhoids that prolapse but reduce       spontaneously). Impression:            - Diverticulosis in the entire examined colon.                        - Non-bleeding internal hemorrhoids.                        - No specimens collected. Recommendation:        - Return patient to hospital ward for ongoing care.                        - Resume previous diet.                        - Bleeding most likely from hemorhoids.                        No blood seen in th colon.                        If further bleeding then consult surgery. Procedure Code(s):     --- Professional ---                        [phone], Colonoscopy, flexible; diagnostic, including                         collection of specimen(s) by brushing or washing, when                         performed (separate procedure) Diagnosis Code(s):     --- Professional ---  K92.1, Melena (includes Hematochezia) CPT copyright 2022 American Medical Association. All rights reserved. The codes documented in this report are preliminary and upon coder review may  be revised to meet current compliance requirements. [name] MD, MD 06/28/2024 8:03:14 AM This report has been signed electronically. Number of Addenda: 0 Note Initiated On: 06/28/2024 7:33 AM Scope Withdrawal Time: 0 hours 7 minutes 14 seconds  Total Procedure Duration: 0 hours 12  minutes 4 seconds  Estimated Blood Loss:  Estimated blood loss: none.      [facility]

## 2024-06-28 NOTE — Discharge Summary (Signed)
 Physician Discharge Summary   Patient: Nathan Rivera MRN: [id number] DOB: Feb 05, 1946  Admit date:     06/26/2024  Discharge date: 06/28/24  Discharge Physician: Amaryllis Dare   PCP: Jeffie Cheryl BRAVO, MD   Recommendations at discharge:  Please obtain CBC and renal function on follow-up Follow-up with primary care provider Follow-up with nephrology  Discharge Diagnoses: Principal Problem:   Lower GI bleed Active Problems:   Normocytic anemia   CKD (chronic kidney disease), stage IV (HCC)   Hypertension   Peripheral vascular disease   Alzheimer dementia (HCC)   Hypercholesterolemia   GERD (gastroesophageal reflux disease)   Hospital Course: Partly taken from H&P.  Nathan Rivera is a 78 y.o. male with medical history significant of AAA status post endovascular repair in 2011 on Xarelto , PAD, HTN, history of duodenal ulcer with perforation status post omental patch repair, CKD stage IV, brought in by family member for rectal bleed.  Last dose of Xarelto  was on 06/25/2024.  Patient has an history of concern of upper GI bleed in July 2025 s/p EGD which found gastric polyps and no active bleeding.  On presentation hemodynamically stable, labs with hemoglobin of 7.6, gradually worsening secondary, patient 66, creatinine 3.9, CO2 19.  GI was consulted and they are planning to do colonoscopy on 06/27/2024  10/2: Mildly elevated blood pressure at 162/75, hemoglobin at 7.7 s/p 1 unit of PRBC, iron studies with low iron and saturation, normal ferritin and  increased reticulocyte count.  Giving a dose of IV iron and Aranesp. Colonoscopy got canceled due to incomplete prep, likely be done tomorrow morning.  10/3: Hemodynamically stable, s/p colonoscopy with shows hemorrhoids and diverticulosis.  His home Xarelto  dose was decreased to 15 mg based on his renal function.  Patient will continue with his current medications which include decreased dose of Xarelto .  He need to have a close  follow-up with his providers which would include his nephrologist as he might be approaching dialysis.  Renal function currently stable.  Assessment and Plan: * Lower GI bleed No more active bleeding but hemoglobin recorded as low as 6.7 s/p 1 unit of PRBC and now at 7.7.  GI was consulted and patient underwent colonoscopy which shows internal hemorrhoids and diverticulosis, without any sign of obvious bleeding.  Home Xarelto  dose was decreased to 15 mg based on renal function.  Normocytic anemia Seems multifactorial, anemia of chronic disease secondary to CKD with some iron deficiency. - Received 1 dose of IV iron and Aranesp, his nephrologist should be able to continue with EPO or Aranesp.  CKD (chronic kidney disease), stage IV (HCC) Seems like having AKI with underlying CKD stage IV, creatinine with some improvement to 3.25 today. -Monitor renal function -Avoid nephrotoxins  Hypertension -Continuing home medications.  Peripheral vascular disease Patient also has an history of remote AAA rupture status postrepair. - Home Xarelto  was initially held and he can restart at day renal appropriate dose of 15 mg daily. -Continue with statin  Alzheimer dementia (HCC) - Continue home Aricept  and memantine  Hypercholesterolemia - Continue home statin  GERD (gastroesophageal reflux disease) - Continue with PPI  Consultants: Gastroenterology Procedures performed: Colonoscopy Disposition: Home Diet recommendation:  Discharge Diet Orders (From admission, onward)     Start     Ordered   06/28/24 0000  Diet - low sodium heart healthy        06/28/24 1033           Cardiac and Carb modified diet DISCHARGE  MEDICATION: Allergies as of 06/28/2024       Reactions   Duloxetine Dermatitis        Medication List     TAKE these medications    acetaminophen  325 MG tablet Commonly known as: TYLENOL  Take 325 mg by mouth every 6 (six) hours as needed for mild pain.   amLODipine   10 MG tablet Commonly known as: NORVASC  Take 1 tablet (10 mg total) by mouth daily. What changed: Another medication with the same name was removed. Continue taking this medication, and follow the directions you see here.   Aspirin  Low Dose 81 MG tablet Generic drug: aspirin  EC Take 1 tablet (81 mg total) by mouth daily. Swallow whole.   bisacodyl  5 MG EC tablet Generic drug: bisacodyl  Take 2 tablets (10 mg total) by mouth at bedtime. What changed:  when to take this reasons to take this Another medication with the same name was removed. Continue taking this medication, and follow the directions you see here.   cholecalciferol 1000 units tablet Commonly known as: VITAMIN D Take 1,000 Units by mouth daily.   donepezil  10 MG tablet Commonly known as: ARICEPT  Take 10 mg by mouth at bedtime.   ferrous sulfate 325 (65 FE) MG EC tablet Take 1 tablet (325 mg total) by mouth daily with breakfast.   FOLIC ACID PO Take by mouth.   gabapentin  100 MG capsule Commonly known as: NEURONTIN  200 mg 2 (two) times daily. 200 mg in am and 200 mg in PM   memantine 10 MG tablet Commonly known as: NAMENDA TAKE 1 TABLET BY MOUTH TWICE DAILY FOR MEMORY LOSS   pantoprazole  40 MG tablet Commonly known as: PROTONIX  Take 40 mg by mouth at bedtime.   polyethylene glycol powder 17 GM/SCOOP powder Commonly known as: GLYCOLAX /MIRALAX  Take 17 g by mouth 2 (two) times daily.   Rivaroxaban  15 MG Tabs tablet Commonly known as: XARELTO  Take 1 tablet (15 mg total) by mouth daily with supper. What changed:  medication strength how much to take   Rivaroxaban  15 MG Tabs tablet Commonly known as: Xarelto  Take 1 tablet (15 mg total) by mouth daily with supper for 7 days. What changed: You were already taking a medication with the same name, and this prescription was added. Make sure you understand how and when to take each.   simvastatin  20 MG tablet Commonly known as: ZOCOR  Take 20 mg by mouth at  bedtime.   vitamin B-12 500 MCG tablet Commonly known as: CYANOCOBALAMIN  Take 500 mcg by mouth daily.        Discharge Exam: Filed Weights   06/26/24 1754  Weight: 89.8 kg   General.  Well-developed elderly man, in no acute distress. Pulmonary.  Lungs clear bilaterally, normal respiratory effort. CV.  Regular rate and rhythm, no JVD, rub or murmur. Abdomen.  Soft, nontender, nondistended, BS positive. CNS.  Alert and oriented .  No focal neurologic deficit. Extremities.  No edema, no cyanosis, pulses intact and symmetrical. Psychiatry.  Judgment and insight appears normal.   Condition at discharge: stable  The results of significant diagnostics from this hospitalization (including imaging, microbiology, ancillary and laboratory) are listed below for reference.   Imaging Studies: VAS US  EVAR DUPLEX Result Date: 06/17/2024 Endovascular Aortic Repair Study (EVAR) Patient Name:  NASIR BRIGHT  Date of Exam:   06/14/2024 Medical Rec #: [id number]       Accession #:    [id number] Date of Birth: [birth year]  Patient Gender: M Patient Age:   37 years Exam Location:  [facility] Procedure:      VAS US  EVAR DUPLEX Referring Phys: SELINDA GU --------------------------------------------------------------------------------  Indications: Follow up exam for EVAR. Surgery date 2011; 04/26/2024. Vascular Interventions: 04/26/2024 1. Ultrasound-guided access to the femoral                         arteries bilaterally.                         2. Introduction catheter into aorta bilateral femoral                         artery approach                         3. Coil embolization of the left internal iliac artery                         for repair of common iliac artery aneurysm                         4. Extension of the left limb of the existing EVAR into                         the left external iliac artery for exclusion of type Ib                         endoleak                         5.  Selective injection of the left renal artery                         6. Angioplasty of the left renal artery using a 5 mm                         balloon                         7. Celt closure of the right femoral artery. Limitations: Air/bowel gas.  Performing Technologist: Jerel Croak RVT  Examination Guidelines: A complete evaluation includes B-mode imaging, spectral Doppler, color Doppler, and power Doppler as needed of all accessible portions of each vessel. Bilateral testing is considered an integral part of a complete examination. Limited examinations for reoccurring indications may be performed as noted.  Abdominal Aorta Findings: +-------------+-------+----------+----------+--------+--------+--------+ Location     AP (cm)Trans (cm)PSV (cm/s)WaveformThrombusComments +-------------+-------+----------+----------+--------+--------+--------+ Proximal     2.44   2.37      73                                 +-------------+-------+----------+----------+--------+--------+--------+ RT EIA Mid                    57                                 +-------------+-------+----------+----------+--------+--------+--------+ LT CIA Mid   2.8  3.7                                          +-------------+-------+----------+----------+--------+--------+--------+ LT CIA Distal                 206                                +-------------+-------+----------+----------+--------+--------+--------+ LT EIA Prox                   156                                +-------------+-------+----------+----------+--------+--------+--------+ LT EIA Mid                    241                                +-------------+-------+----------+----------+--------+--------+--------+ LT EIA Distal                 176                                +-------------+-------+----------+----------+--------+--------+--------+ Endovascular Aortic Repair (EVAR):  +----------+----------------+-------------------+-------------------+           Diameter AP (cm)Diameter Trans (cm)Velocities (cm/sec) +----------+----------------+-------------------+-------------------+ Aorta     3.42            3.70               70                  +----------+----------------+-------------------+-------------------+ Right Limb                                   87                  +----------+----------------+-------------------+-------------------+ Left Limb                                    73                  +----------+----------------+-------------------+-------------------+  Summary: Abdominal Aorta: There is evidence of abnormal dilatation of the distal Abdominal aorta. There is evidence of abnormal dilation of the Left Common Iliac artery. Patent endovascular aneurysm repair with no evidence of endoleak. Extensive bowel gas limits this study. Evar S/P repair and new Lt limb extension for 1b endoleak of left iliac aneurysm. Left iliac aneurysm is 2.78x3.66cm with no endoleak seen. Lt limb extension shows normal flow to the distal EIA. Large mass left abdomen below left ribcage measures 6.17 x 8.74cm with no blood flow within. Some echogenic debris is seen within, ?seroma. The largest aortic diameter remains essentially unchanged compared to prior exam. Previous diameter measurement was 3.6 cm obtained on 04/2022.  *See table(s) above for measurements and observations.  Electronically signed by Selinda Gu MD on 06/17/2024 at 10:36:48 AM.    Final     Microbiology: Results for orders placed or performed during the hospital encounter of  04/15/24  Blood culture (routine x 2)     Status: None   Collection Time: 04/15/24  8:55 PM   Specimen: BLOOD  Result Value Ref Range Status   Specimen Description BLOOD BLOOD RIGHT ARM  Final   Special Requests   Final    BOTTLES DRAWN AEROBIC AND ANAEROBIC Blood Culture adequate volume   Culture   Final    NO GROWTH 5  DAYS Performed at [facility], [address] [place], [place] [address]    Report Status 04/20/2024 FINAL  Final  Blood culture (routine x 2)     Status: None   Collection Time: 04/15/24  8:55 PM   Specimen: BLOOD  Result Value Ref Range Status   Specimen Description BLOOD BLOOD LEFT ARM  Final   Special Requests   Final    BOTTLES DRAWN AEROBIC AND ANAEROBIC Blood Culture results may not be optimal due to an inadequate volume of blood received in culture bottles   Culture   Final    NO GROWTH 5 DAYS Performed at [facility], [address] [place], [place] [address]    Report Status 04/20/2024 FINAL  Final  MRSA Next Gen by PCR, Nasal     Status: None   Collection Time: 04/15/24 10:01 PM   Specimen: Nasal Mucosa; Nasal Swab  Result Value Ref Range Status   MRSA by PCR Next Gen NOT DETECTED NOT DETECTED Final    Comment: (NOTE) The GeneXpert MRSA Assay (FDA approved for NASAL specimens only), is one component of a comprehensive MRSA colonization surveillance program. It is not intended to diagnose MRSA infection nor to guide or monitor treatment for MRSA infections. Test performance is not FDA approved in patients less than 27 years old. Performed at [facility], [address] [place], [place] [address]   Culture, Respiratory w Gram Stain     Status: None   Collection Time: 04/16/24 11:42 AM   Specimen: SPU; Respiratory  Result Value Ref Range Status   Specimen Description   Final    SPUTUM Performed at [facility], [address], [place], [place] [address]    Special Requests   Final     EXPSU Performed at [facility], [address] [place], [place] [address]    Gram Stain   Final    ABUNDANT SQUAMOUS EPITHELIAL CELLS PRESENT MODERATE GRAM POSITIVE COCCI MODERATE GRAM NEGATIVE RODS    Culture   Final    Normal respiratory flora-no Staph aureus or Pseudomonas seen Performed at [facility], [address] [place], [place] [address]    Report Status 04/18/2024 FINAL  Final  Respiratory (~20 pathogens) panel by PCR     Status: None   Collection Time: 04/16/24 11:42 AM   Specimen: Expectorated Sputum; Respiratory  Result Value Ref Range Status   Adenovirus NOT DETECTED NOT DETECTED Final   Coronavirus 229E NOT DETECTED NOT DETECTED Final    Comment: (NOTE) The Coronavirus on the Respiratory Panel, DOES NOT test for the novel  Coronavirus (2019 nCoV)    Coronavirus HKU1 NOT DETECTED NOT DETECTED Final   Coronavirus NL63 NOT DETECTED NOT DETECTED Final   Coronavirus OC43 NOT DETECTED NOT DETECTED Final   Metapneumovirus NOT DETECTED NOT DETECTED Final   Rhinovirus / Enterovirus NOT DETECTED NOT DETECTED Final   Influenza A NOT DETECTED NOT DETECTED Final   Influenza B NOT DETECTED NOT DETECTED Final   Parainfluenza Virus 1 NOT DETECTED NOT  DETECTED Final   Parainfluenza Virus 2 NOT DETECTED NOT DETECTED Final   Parainfluenza Virus 3 NOT DETECTED NOT DETECTED Final   Parainfluenza Virus 4 NOT DETECTED NOT DETECTED Final   Respiratory Syncytial Virus NOT DETECTED NOT DETECTED Final   Bordetella pertussis NOT DETECTED NOT DETECTED Final   Bordetella Parapertussis NOT DETECTED NOT DETECTED Final   Chlamydophila pneumoniae NOT DETECTED NOT DETECTED Final   Mycoplasma pneumoniae NOT DETECTED NOT DETECTED Final    Comment: Performed at [facility], [address] [place], [place] [address]    Labs: CBC: Recent Labs  Lab 06/26/24 9077 06/26/24 1729 06/27/24 0101 06/28/24 0504  WBC 4.7  --   --  5.3  NEUTROABS 3.1  --   --   --   HGB 7.6* 6.7* 7.7* 7.7*  HCT 24.8* 21.5* 24.3* 24.4*  MCV 100.0  --   --  96.4  PLT 212  --   --  187   Basic Metabolic Panel: Recent Labs  Lab 06/26/24 0922 06/27/24 0906 06/28/24 0504  NA 144 143 142  K 3.9 4.0 3.6  CL 111 112* 113*  CO2 19* 21* 21*  GLUCOSE 110* 84 71  BUN 66* 49* 40*  CREATININE 3.95* 3.22* 3.24*  CALCIUM 8.9  8.5* 8.5*  PHOS  --  3.4 3.2   Liver Function Tests: Recent Labs  Lab 06/26/24 0922 06/27/24 0906 06/28/24 0504  AST 20  --   --   ALT 12  --   --   ALKPHOS 42  --   --   BILITOT 0.6  --   --   PROT 6.3*  --   --   ALBUMIN  3.1* 2.8* 2.9*   CBG: No results for input(s): GLUCAP in the last 168 hours.  Discharge time spent: greater than 30 minutes.  This record has been created using Conservation officer, historic buildings. Errors have been sought and corrected,but may not always be located. Such creation errors do not reflect on the standard of care.   Signed: Amaryllis Dare, MD [facility] 06/28/2024

## 2024-06-28 NOTE — Plan of Care (Signed)

## 2024-06-28 NOTE — Progress Notes (Signed)
 Patient discharged to home. DC instructions given with wife at bedside. All of wife's questions answered to her satisfaction. Pt left unit in wheelchair pushed by hospital volunteer accompanied by wife. Left in stable condition.

## 2024-06-28 NOTE — Progress Notes (Signed)
 This patient underwent a colonoscopy today with diverticulosis and hemorrhoids seen.  There is no blood throughout the entire colon suggesting that this is likely a diverticular bleed especially with the wife stating the patient was very constipated prior to admission and the blood was bright red.  If the patient should have any further sign of GI bleeding then I would suggest a surgical consult for hemorrhoidectomy.  Nothing further to do from a GI point of view at this time.  I will sign off.  Please call if any further GI concerns or questions.  We would like to thank you for the opportunity to participate in the care of Nathan Rivera.

## 2024-06-28 NOTE — Anesthesia Preprocedure Evaluation (Signed)
 Anesthesia Evaluation  Patient identified by MRN, date of birth, ID band Patient awake and Patient confused    Reviewed: Allergy & Precautions, NPO status , Patient's Chart, lab work & pertinent test results  History of Anesthesia Complications (+) PONV and history of anesthetic complications  Airway Mallampati: II  TM Distance: >3 FB Neck ROM: full    Dental  (+) Teeth Intact   Pulmonary neg pulmonary ROS, Patient abstained from smoking., former smoker   Pulmonary exam normal  + decreased breath sounds      Cardiovascular Exercise Tolerance: Poor hypertension, Pt. on medications + Peripheral Vascular Disease  negative cardio ROS Normal cardiovascular exam Rhythm:Regular Rate:Normal     Neuro/Psych       Dementia CVA, Residual Symptoms negative neurological ROS  negative psych ROS   GI/Hepatic negative GI ROS, Neg liver ROS, PUD,GERD  Medicated,,  Endo/Other  negative endocrine ROS    Renal/GU CRFRenal diseasenegative Renal ROS  negative genitourinary   Musculoskeletal  (+) Arthritis ,    Abdominal   Peds negative pediatric ROS (+)  Hematology negative hematology ROS (+) Blood dyscrasia, anemia   Anesthesia Other Findings Past Medical History: No date: Alzheimer dementia (HCC) No date: Aortic aneurysm No date: Arthritis     Comment:  knees are the biggest problem 2012: Cancer (HCC)     Comment:  bladder 2014: CKD (chronic kidney disease), stage III (HCC)     Comment:  relative to infection, septic- renal calculi No date: Degenerative disc disease No date: Degenerative joint disease No date: GERD (gastroesophageal reflux disease) No date: History of kidney stones No date: Hypercholesterolemia No date: Hypertension     Comment:  no meds currently No date: Impaired glucose tolerance No date: Muscle fatigue     Comment:  muscle fatigue relative to sepsis experience  No date: Peripheral vascular  disease No date: Pneumonia No date: Raynaud's disease 07/2013: Sepsis (HCC) No date: Stroke [facility]) 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 unsure which leg, treating with Xarelto  08/17/2013: UTI (urinary tract infection)     Comment:  Lt nephrostomy tube and stent placement  Past Surgical History: 2011: ABDOMINAL AORTIC ANEURYSM REPAIR 02/15/2021: CATARACT EXTRACTION W/PHACO; Left     Comment:  Procedure: CATARACT EXTRACTION PHACO AND INTRAOCULAR               LENS PLACEMENT (IOC) LEFT 5.59 00:44.0;  Surgeon: Myrna Adine Anes, MD;  Location: [facility];                Service: Ophthalmology;  Laterality: Left; 03/08/2021: CATARACT EXTRACTION W/PHACO; Right     Comment:  Procedure: CATARACT EXTRACTION PHACO AND INTRAOCULAR               LENS PLACEMENT (IOC) RIGHT 1.81 00:22.7;  Surgeon: Myrna Adine Anes, MD;  Location: [facility];                Service: Ophthalmology;  Laterality: Right; 04/26/2024: ENDOVASCULAR STENT GRAFT (AAA); N/A     Comment:  Procedure: ENDOVASCULAR REPAIR/STENT GRAFT;  Surgeon:               Jama Cordella MATSU, MD;  Location: [facility];               Service: Cardiovascular;  Laterality: N/A; 04/16/2024: ESOPHAGOGASTRODUODENOSCOPY; N/A     Comment:  Procedure: EGD (ESOPHAGOGASTRODUODENOSCOPY);  Surgeon:               Jinny Carmine, MD;  Location: [facility];  Service:               Endoscopy;  Laterality: N/A; No date: HERNIA REPAIR     Comment:  27mo old 10/24/2014: INSERTION OF MESH; N/A     Comment:  Procedure: INSERTION OF MESH;  Surgeon: Deward Null III,               MD;  Location: [facility] OR;  Service: General;  Laterality:               N/A; 08/24/2013: LAPAROTOMY; N/A     Comment:  Procedure: EXPLORATORY LAPAROTOMY;  Surgeon: Deward GORMAN Null DOUGLAS, MD;  Location: [facility] OR;  Service: General;                Laterality: N/A;  Gram patch closure 09/24/2013:  litrotripsy Nov. 2014: NEPHROSTOMY Dec. 2014: nephrostomy removal No date: PERIPHERALLY INSERTED CENTRAL CATHETER INSERTION 04/19/2024: RENAL ANGIOGRAPHY; Bilateral     Comment:  Procedure: RENAL ANGIOGRAPHY;  Surgeon: Marea Selinda GORMAN,               MD;  Location: [facility];  Service:               Cardiovascular;  Laterality: Bilateral; No date: TONSILLECTOMY No date: UMBILICAL HERNIA REPAIR     Comment:  50 yr ago 10/24/2014: VENTRAL HERNIA REPAIR; N/A     Comment:  Procedure: LAPAROSCOPIC ASSISTED VENTRAL HERNIA REPAIR               WITH MESH;  Surgeon: Deward Null III, MD;  Location: [facility] OR;              Service: General;  Laterality: N/A; 11/17/2022: XI ROBOTIC ASSISTED INGUINAL HERNIA REPAIR WITH MESH;  Bilateral     Comment:  Procedure: XI ROBOTIC ASSISTED INGUINAL HERNIA REPAIR               WITH MESH-Bilateral;  Surgeon: Desiderio Schanz, MD;                Location: [facility] ORS;  Service: General;  Laterality:               Bilateral;  BMI    Body Mass Index: 29.24 kg/m      Reproductive/Obstetrics negative OB ROS                              Anesthesia Physical Anesthesia Plan  ASA: 3  Anesthesia Plan: General   Post-op Pain Management:    Induction: Intravenous  PONV Risk Score and Plan: Propofol  infusion and TIVA  Airway Management Planned: Natural Airway and Nasal Cannula  Additional Equipment:   Intra-op Plan:   Post-operative Plan:   Informed Consent: I have reviewed the patients History and Physical, chart, labs and discussed the procedure including the risks, benefits and alternatives for the proposed anesthesia with the patient or authorized representative who has indicated his/her understanding and acceptance.     Dental Advisory Given  Plan Discussed with: CRNA  Anesthesia Plan Comments:          Anesthesia Quick Evaluation

## 2024-07-03 NOTE — Anesthesia Postprocedure Evaluation (Signed)
 Anesthesia Post Note  Patient: Nathan Rivera  Procedure(s) Performed: COLONOSCOPY  Patient location during evaluation: PACU Anesthesia Type: General Level of consciousness: awake and awake and alert Pain management: satisfactory to patient Respiratory status: spontaneous breathing Cardiovascular status: stable Anesthetic complications: no   No notable events documented.   Last Vitals:  Vitals:   06/28/24 0805 06/28/24 0853  BP: 135/72 (!) 173/84  Pulse: (!) 52 61  Resp: 15 14  Temp:  36.4 C  SpO2: 98% 98%    Last Pain:  Vitals:   06/28/24 0805  TempSrc:   PainSc: 0-No pain                 VAN STAVEREN,[name]

## 2024-09-12 ENCOUNTER — Other Ambulatory Visit (INDEPENDENT_AMBULATORY_CARE_PROVIDER_SITE_OTHER): Payer: Self-pay | Admitting: Vascular Surgery

## 2024-09-12 DIAGNOSIS — I701 Atherosclerosis of renal artery: Secondary | ICD-10-CM

## 2024-09-12 DIAGNOSIS — I7143 Infrarenal abdominal aortic aneurysm, without rupture: Secondary | ICD-10-CM

## 2024-09-13 ENCOUNTER — Other Ambulatory Visit (INDEPENDENT_AMBULATORY_CARE_PROVIDER_SITE_OTHER)

## 2024-09-13 ENCOUNTER — Encounter (INDEPENDENT_AMBULATORY_CARE_PROVIDER_SITE_OTHER): Payer: Self-pay | Admitting: Nurse Practitioner

## 2024-09-13 ENCOUNTER — Ambulatory Visit (INDEPENDENT_AMBULATORY_CARE_PROVIDER_SITE_OTHER): Admitting: Nurse Practitioner

## 2024-09-13 VITALS — BP 164/85 | HR 60 | Resp 18 | Wt 207.0 lb

## 2024-09-13 DIAGNOSIS — I7143 Infrarenal abdominal aortic aneurysm, without rupture: Secondary | ICD-10-CM | POA: Diagnosis not present

## 2024-09-13 DIAGNOSIS — I701 Atherosclerosis of renal artery: Secondary | ICD-10-CM

## 2024-09-13 DIAGNOSIS — I714 Abdominal aortic aneurysm, without rupture, unspecified: Secondary | ICD-10-CM | POA: Diagnosis not present

## 2024-09-13 DIAGNOSIS — I10 Essential (primary) hypertension: Secondary | ICD-10-CM | POA: Diagnosis not present

## 2024-09-13 DIAGNOSIS — I723 Aneurysm of iliac artery: Secondary | ICD-10-CM | POA: Diagnosis not present

## 2024-09-16 ENCOUNTER — Encounter (INDEPENDENT_AMBULATORY_CARE_PROVIDER_SITE_OTHER): Payer: Self-pay | Admitting: Nurse Practitioner

## 2024-09-16 NOTE — Progress Notes (Signed)
 "  Subjective:    Patient ID: Nathan Rivera, male    DOB: [birth year], 78 y.o.   MRN: [id number] Chief Complaint  Patient presents with   Follow-up    3 month EVAR and renal u/s follow up    HPI  Discussed the use of AI scribe software for clinical note transcription with the patient, who gave verbal consent to proceed.  History of Present Illness Nathan Rivera is a 78 year old male with prior endovascular repair and coil embolization for aneurysmal disease who presents for three-month vascular surgery follow-up.  Three months ago, he underwent endovascular intervention with coil embolization for a leaking aneurysm. Since the procedure, he has not experienced buttock pain, claudication, or difficulty with ambulation. He denies fatigue or cramping in the buttocks after walking. He does report knee pain, but no symptoms in the buttocks. Serial imaging has demonstrated a reduction in aneurysm size from 2.78 x 3.66 cm to 1.10 x 1.29 cm, with no evidence of endoleak. A previously noted subcostal mass, suspected to be a hematoma or seroma, is no longer visualized on current imaging.  Renal artery evaluation has focused on the right side due to decreased function of the left kidney.  Renal cysts have been identified on imaging, with documentation dating back to at least 2024. He has not experienced any new symptoms attributable to the cysts.    Results Radiology Abdominal CT (03/2024): Renal cysts stable compared to prior imaging Abdominal CT (2024): Renal cysts present  Renal artery duplex ultrasound Right renal artery with less than 25% stenosis. No significant abnormality in renal blood flow. Renal cysts present, stable compared to prior imaging. No evidence of new masses or hematoma. Left kidney with decreased function. No evidence of perirenal hematoma or seroma.  Aneurysm surveillance ultrasound Left iliac artery aneurysm decreased in size to 1.10 x 1.29 cm from previous 2.78 x 3.66  cm. No evidence of endoleak. No perivascular mass, hematoma, or seroma detected.   Review of Systems     Objective:   Physical Exam  Physical Exam    BP (!) 164/85 (BP Location: Right Arm)   Pulse 60   Resp 18   Wt 207 lb (93.9 kg)   BMI 30.57 kg/m   Past Medical History:  Diagnosis Date   Alzheimer dementia (HCC)    Aortic aneurysm    Arthritis    knees are the biggest problem   Cancer (HCC) 2012   bladder   CKD (chronic kidney disease), stage III (HCC) 2014   relative to infection, septic- renal calculi   Degenerative disc disease    Degenerative joint disease    GERD (gastroesophageal reflux disease)    History of kidney stones    Hypercholesterolemia    Hypertension    no meds currently   Impaired glucose tolerance    Muscle fatigue    muscle fatigue relative to sepsis experience    Peripheral vascular disease    Pneumonia    Raynaud's disease    Sepsis (HCC) 07/2013   Stroke (HCC)    Thromboembolism of left popilteal vein 2015   pt. unsure which leg, treating with Xarelto    UTI (urinary tract infection) 08/17/2013   Lt nephrostomy tube and stent placement    Social History   Socioeconomic History   Marital status: Married    Spouse name: [name]   Number of children: Not on file   Years of education: Not on file   Highest education level: Not  on file  Occupational History   Not on file  Tobacco Use   Smoking status: Former    Current packs/day: 1.00    Average packs/day: 1 pack/day for 40.0 years (40.0 ttl pk-yrs)    Types: Cigarettes    Passive exposure: Past   Smokeless tobacco: Never   Tobacco comments:    since age 47  Vaping Use   Vaping status: Never Used  Substance and Sexual Activity   Alcohol use: Yes    Alcohol/week: 3.0 standard drinks of alcohol    Types: 3 Glasses of wine per week   Drug use: No   Sexual activity: Not on file  Other Topics Concern   Not on file  Social History Narrative   Not on file   Social Drivers  of Health   Tobacco Use: Medium Risk (09/16/2024)   Patient History    Smoking Tobacco Use: Former    Smokeless Tobacco Use: Never    Passive Exposure: Past  Physicist, Medical Strain: Low Risk  (07/31/2024)   Received from [facility]   Overall Financial Resource Strain (CARDIA)    Difficulty of Paying Living Expenses: Not hard at all  Food Insecurity: No Food Insecurity (07/31/2024)   Received from [facility]   Epic    Within the past 12 months, you worried that your food would run out before you got the money to buy more.: Never true    Within the past 12 months, the food you bought just didn't last and you didn't have money to get more.: Never true  Transportation Needs: No Transportation Needs (07/31/2024)   Received from [facility] - Transportation    In the past 12 months, has lack of transportation kept you from medical appointments or from getting medications?: No    Lack of Transportation (Non-Medical): No  Physical Activity: Not on file  Stress: Not on file  Social Connections: Unknown (06/27/2024)   Social Connection and Isolation Panel    Frequency of Communication with Friends and Family: Never    Frequency of Social Gatherings with Friends and Family: Never    Attends Religious Services: Never    Database Administrator or Organizations: Patient declined    Attends Engineer, Structural: Patient declined    Marital Status: Married  Recent Concern: Social Connections - Moderately Isolated (05/09/2024)   Social Connection and Isolation Panel    Frequency of Communication with Friends and Family: Once a week    Frequency of Social Gatherings with Friends and Family: Twice a week    Attends Religious Services: Patient declined    Database Administrator or Organizations: No    Attends Banker Meetings: Never    Marital Status: Married  Catering Manager Violence: Not At Risk (06/27/2024)    Epic    Fear of Current or Ex-Partner: No    Emotionally Abused: No    Physically Abused: No    Sexually Abused: No  Depression (PHQ2-9): Not on file  Alcohol Screen: Not on file  Housing: Low Risk  (07/31/2024)   Received from [facility]   Epic    In the last 12 months, was there a time when you were not able to pay the mortgage or rent on time?: No    In the past 12 months, how many times have you moved where you were living?: 0    At any time in the  past 12 months, were you homeless or living in a shelter (including now)?: No  Utilities: Not At Risk (07/31/2024)   Received from [facility]   Epic    In the past 12 months has the electric, gas, oil, or water company threatened to shut off services in your home?: No  Health Literacy: Not on file    Past Surgical History:  Procedure Laterality Date   ABDOMINAL AORTIC ANEURYSM REPAIR  2011   CATARACT EXTRACTION W/PHACO Left 02/15/2021   Procedure: CATARACT EXTRACTION PHACO AND INTRAOCULAR LENS PLACEMENT (IOC) LEFT 5.59 00:44.0;  Surgeon: Myrna Adine Anes, MD;  Location: [facility];  Service: Ophthalmology;  Laterality: Left;   CATARACT EXTRACTION W/PHACO Right 03/08/2021   Procedure: CATARACT EXTRACTION PHACO AND INTRAOCULAR LENS PLACEMENT (IOC) RIGHT 1.81 00:22.7;  Surgeon: Myrna Adine Anes, MD;  Location: [facility];  Service: Ophthalmology;  Laterality: Right;   COLONOSCOPY N/A 06/28/2024   Procedure: COLONOSCOPY;  Surgeon: Jinny Carmine, MD;  Location: [facility] ENDOSCOPY;  Service: Endoscopy;  Laterality: N/A;  ED-32   ENDOVASCULAR STENT GRAFT (AAA) N/A 04/26/2024   Procedure: ENDOVASCULAR REPAIR/STENT GRAFT;  Surgeon: Jama Cordella MATSU, MD;  Location: [facility] INVASIVE CV LAB;  Service: Cardiovascular;  Laterality: N/A;   ESOPHAGOGASTRODUODENOSCOPY N/A 04/16/2024   Procedure: EGD (ESOPHAGOGASTRODUODENOSCOPY);  Surgeon: Jinny Carmine, MD;  Location: [facility] ENDOSCOPY;  Service: Endoscopy;   Laterality: N/A;   HERNIA REPAIR     66mo old   INSERTION OF MESH N/A 10/24/2014   Procedure: INSERTION OF MESH;  Surgeon: Deward Null III, MD;  Location: [facility] OR;  Service: General;  Laterality: N/A;   LAPAROTOMY N/A 08/24/2013   Procedure: EXPLORATORY LAPAROTOMY;  Surgeon: Deward GORMAN Null DOUGLAS, MD;  Location: [facility] OR;  Service: General;  Laterality: N/A;  Gram patch closure   litrotripsy  09/24/2013   NEPHROSTOMY  Nov. 2014   nephrostomy removal  Dec. 2014   PERIPHERALLY INSERTED CENTRAL CATHETER INSERTION     RENAL ANGIOGRAPHY Bilateral 04/19/2024   Procedure: RENAL ANGIOGRAPHY;  Surgeon: Marea Selinda GORMAN, MD;  Location: [facility] INVASIVE CV LAB;  Service: Cardiovascular;  Laterality: Bilateral;   TONSILLECTOMY     UMBILICAL HERNIA REPAIR     50 yr ago   VENTRAL HERNIA REPAIR N/A 10/24/2014   Procedure: LAPAROSCOPIC ASSISTED VENTRAL HERNIA REPAIR WITH MESH;  Surgeon: Deward Null III, MD;  Location: [facility] OR;  Service: General;  Laterality: N/A;   XI ROBOTIC ASSISTED INGUINAL HERNIA REPAIR WITH MESH Bilateral 11/17/2022   Procedure: XI ROBOTIC ASSISTED INGUINAL HERNIA REPAIR WITH MESH-Bilateral;  Surgeon: Desiderio Schanz, MD;  Location: [facility] ORS;  Service: General;  Laterality: Bilateral;    Family History  Problem Relation Age of Onset   CVA Father    Atrial fibrillation Sister     Allergies[1]     Latest Ref Rng & Units 06/28/2024    5:04 AM 06/27/2024    1:01 AM 06/26/2024    5:29 PM  CBC  WBC 4.0 - 10.5 K/uL 5.3     Hemoglobin 13.0 - 17.0 g/dL 7.7  7.7  6.7   Hematocrit 39.0 - 52.0 % 24.4  24.3  21.5   Platelets 150 - 400 K/uL 187         CMP     Component Value Date/Time   NA 142 06/28/2024 0504   NA 144 10/23/2014 1630   K 3.6 06/28/2024 0504   K 4.1 10/23/2014 1630   CL 113 (H) 06/28/2024 0504   CL  112 (H) 10/23/2014 1630   CO2 21 (L) 06/28/2024 0504   CO2 26 10/23/2014 1630   GLUCOSE 71 06/28/2024 0504   GLUCOSE 83 10/23/2014 1630   BUN 40 (H) 06/28/2024 0504   BUN 30 (H) 10/23/2014  1630   CREATININE 3.24 (H) 06/28/2024 0504   CREATININE 1.96 (H) 10/23/2014 1630   CALCIUM 8.5 (L) 06/28/2024 0504   CALCIUM 9.0 10/23/2014 1630   PROT 6.3 (L) 06/26/2024 0922   PROT 5.8 (L) 08/18/2013 0504   ALBUMIN  2.9 (L) 06/28/2024 0504   ALBUMIN  1.4 (L) 08/18/2013 0504   AST 20 06/26/2024 0922   AST 71 (H) 08/18/2013 0504   ALT 12 06/26/2024 0922   ALT 57 08/18/2013 0504   ALKPHOS 42 06/26/2024 0922   ALKPHOS 349 (H) 08/18/2013 0504   BILITOT 0.6 06/26/2024 0922   BILITOT 1.2 (H) 08/18/2013 0504   GFRNONAA 19 (L) 06/28/2024 0504   GFRNONAA 36 (L) 10/23/2014 1630   GFRNONAA 8 (L) 08/18/2013 0504     No results found.     Assessment & Plan:   1. Abdominal aortic aneurysm (AAA) without rupture, unspecified part (Primary) Post-repair imaging showed significant aneurysm size reduction with no endoleak, hematoma, or seroma. Asymptomatic with no claudication or complications. Ongoing surveillance necessary due to potential future endoleak or complications. - Continue routine surveillance with imaging; next follow-up in six months.  2. Aneurysm of iliac artery Post-coiling imaging showed reduced aneurysm size with no leak. Asymptomatic with no buttock pain or walking difficulties. Surveillance required due to risk of recurrence or delayed complications. - Continue routine surveillance with imaging; next follow-up in six months.  3. Renal artery stenosis Renal artery stenosis, right Right renal artery stenosis less than 25% with adequate perfusion. No intervention needed due to preserved right kidney function and decreased left kidney function. - Continue surveillance of right renal artery stenosis.  4. Primary hypertension Continue antihypertensive medications as already ordered, these medications have been reviewed and there are no changes at this time.    Medications Ordered Prior to Encounter[2]  There are no Patient Instructions on file for this visit. Return in  about 6 months (around 03/14/2025) for with renal/EVAR see JD/FB.   Yanilen Adamik E Moesha Sarchet, NP      [1]  Allergies Allergen Reactions   Duloxetine Dermatitis  [2]  Current Outpatient Medications on File Prior to Visit  Medication Sig Dispense Refill   acetaminophen  (TYLENOL ) 325 MG tablet Take 325 mg by mouth every 6 (six) hours as needed for mild pain.     amLODipine  (NORVASC ) 10 MG tablet Take 1 tablet (10 mg total) by mouth daily. 30 tablet 3   aspirin  EC 81 MG tablet Take 1 tablet (81 mg total) by mouth daily. Swallow whole. 30 tablet 12   bisacodyl  (DULCOLAX) 5 MG EC tablet Take 2 tablets (10 mg total) by mouth at bedtime. (Patient taking differently: Take 10 mg by mouth daily as needed for moderate constipation.) 30 tablet 0   cholecalciferol (VITAMIN D) 1000 UNITS tablet Take 1,000 Units by mouth daily.     donepezil  (ARICEPT ) 10 MG tablet Take 10 mg by mouth at bedtime.     ferrous sulfate  325 (65 FE) MG EC tablet Take 1 tablet (325 mg total) by mouth daily with breakfast. 90 tablet 3   FOLIC ACID PO Take by mouth.     gabapentin  (NEURONTIN ) 100 MG capsule 200 mg 2 (two) times daily. 200 mg in am and 200 mg in PM  memantine  (NAMENDA ) 10 MG tablet TAKE 1 TABLET BY MOUTH TWICE DAILY FOR MEMORY LOSS     pantoprazole  (PROTONIX ) 40 MG tablet Take 40 mg by mouth at bedtime.      Rivaroxaban  (XARELTO ) 15 MG TABS tablet Take 1 tablet (15 mg total) by mouth daily with supper. 90 tablet 0   Rivaroxaban  (XARELTO ) 15 MG TABS tablet Take 1 tablet (15 mg total) by mouth daily with supper for 7 days. 7 tablet 0   simvastatin  (ZOCOR ) 20 MG tablet Take 20 mg by mouth at bedtime.     vitamin B-12 (CYANOCOBALAMIN ) 500 MCG tablet Take 500 mcg by mouth daily.     polyethylene glycol powder (GLYCOLAX /MIRALAX ) 17 GM/SCOOP powder Take 17 g by mouth 2 (two) times daily. (Patient not taking: Reported on 09/13/2024) 238 g 0   No current facility-administered medications on file prior to visit.   "

## 2024-10-30 ENCOUNTER — Emergency Department

## 2024-10-30 ENCOUNTER — Encounter: Payer: Self-pay | Admitting: Internal Medicine

## 2024-10-30 ENCOUNTER — Inpatient Hospital Stay
Admission: EM | Admit: 2024-10-30 | Source: Home / Self Care | Attending: Internal Medicine | Admitting: Internal Medicine

## 2024-10-30 ENCOUNTER — Other Ambulatory Visit: Payer: Self-pay

## 2024-10-30 DIAGNOSIS — R6510 Systemic inflammatory response syndrome (SIRS) of non-infectious origin without acute organ dysfunction: Secondary | ICD-10-CM | POA: Insufficient documentation

## 2024-10-30 DIAGNOSIS — N179 Acute kidney failure, unspecified: Secondary | ICD-10-CM | POA: Insufficient documentation

## 2024-10-30 DIAGNOSIS — R4182 Altered mental status, unspecified: Secondary | ICD-10-CM | POA: Diagnosis not present

## 2024-10-30 DIAGNOSIS — B9629 Other Escherichia coli [E. coli] as the cause of diseases classified elsewhere: Secondary | ICD-10-CM | POA: Insufficient documentation

## 2024-10-30 DIAGNOSIS — G309 Alzheimer's disease, unspecified: Secondary | ICD-10-CM | POA: Diagnosis present

## 2024-10-30 DIAGNOSIS — K219 Gastro-esophageal reflux disease without esophagitis: Secondary | ICD-10-CM | POA: Diagnosis present

## 2024-10-30 DIAGNOSIS — N184 Chronic kidney disease, stage 4 (severe): Secondary | ICD-10-CM | POA: Diagnosis present

## 2024-10-30 DIAGNOSIS — N390 Urinary tract infection, site not specified: Principal | ICD-10-CM

## 2024-10-30 DIAGNOSIS — G9341 Metabolic encephalopathy: Secondary | ICD-10-CM

## 2024-10-30 DIAGNOSIS — I10 Essential (primary) hypertension: Secondary | ICD-10-CM | POA: Diagnosis present

## 2024-10-30 DIAGNOSIS — E7800 Pure hypercholesterolemia, unspecified: Secondary | ICD-10-CM | POA: Diagnosis present

## 2024-10-30 LAB — CBC WITH DIFFERENTIAL/PLATELET
Abs Immature Granulocytes: 0.01 10*3/uL (ref 0.00–0.07)
Basophils Absolute: 0 10*3/uL (ref 0.0–0.1)
Basophils Relative: 0 %
Eosinophils Absolute: 0.1 10*3/uL (ref 0.0–0.5)
Eosinophils Relative: 2 %
HCT: 32.8 % — ABNORMAL LOW (ref 39.0–52.0)
Hemoglobin: 10.4 g/dL — ABNORMAL LOW (ref 13.0–17.0)
Immature Granulocytes: 0 %
Lymphocytes Relative: 5 %
Lymphs Abs: 0.3 10*3/uL — ABNORMAL LOW (ref 0.7–4.0)
MCH: 28.3 pg (ref 26.0–34.0)
MCHC: 31.7 g/dL (ref 30.0–36.0)
MCV: 89.4 fL (ref 80.0–100.0)
Monocytes Absolute: 0 10*3/uL — ABNORMAL LOW (ref 0.1–1.0)
Monocytes Relative: 0 %
Neutro Abs: 5.1 10*3/uL (ref 1.7–7.7)
Neutrophils Relative %: 93 %
Platelets: 166 10*3/uL (ref 150–400)
RBC: 3.67 MIL/uL — ABNORMAL LOW (ref 4.22–5.81)
RDW: 16.8 % — ABNORMAL HIGH (ref 11.5–15.5)
Smear Review: NORMAL
WBC: 5.5 10*3/uL (ref 4.0–10.5)
nRBC: 0 % (ref 0.0–0.2)

## 2024-10-30 LAB — COMPREHENSIVE METABOLIC PANEL WITH GFR
ALT: 10 U/L (ref 0–44)
AST: 14 U/L — ABNORMAL LOW (ref 15–41)
Albumin: 3.9 g/dL (ref 3.5–5.0)
Alkaline Phosphatase: 64 U/L (ref 38–126)
Anion gap: 14 (ref 5–15)
BUN: 52 mg/dL — ABNORMAL HIGH (ref 8–23)
CO2: 19 mmol/L — ABNORMAL LOW (ref 22–32)
Calcium: 9 mg/dL (ref 8.9–10.3)
Chloride: 111 mmol/L (ref 98–111)
Creatinine, Ser: 4.39 mg/dL — ABNORMAL HIGH (ref 0.61–1.24)
GFR, Estimated: 13 mL/min — ABNORMAL LOW
Glucose, Bld: 78 mg/dL (ref 70–99)
Potassium: 4.3 mmol/L (ref 3.5–5.1)
Sodium: 144 mmol/L (ref 135–145)
Total Bilirubin: 0.4 mg/dL (ref 0.0–1.2)
Total Protein: 6.9 g/dL (ref 6.5–8.1)

## 2024-10-30 LAB — TROPONIN T, HIGH SENSITIVITY
Troponin T High Sensitivity: 68 ng/L — ABNORMAL HIGH (ref 0–19)
Troponin T High Sensitivity: 58 ng/L — ABNORMAL HIGH (ref 0–19)

## 2024-10-30 LAB — URINALYSIS, ROUTINE W REFLEX MICROSCOPIC
Bilirubin Urine: NEGATIVE
Glucose, UA: NEGATIVE mg/dL
Ketones, ur: NEGATIVE mg/dL
Nitrite: POSITIVE — AB
Protein, ur: >=300 mg/dL — AB
Specific Gravity, Urine: 1.01 (ref 1.005–1.030)
WBC, UA: >50 WBC/hpf (ref 0–5)
pH: 5 (ref 5.0–8.0)

## 2024-10-30 LAB — RESP PANEL BY RT-PCR (RSV, FLU A&B, COVID)  RVPGX2
Influenza A by PCR: NEGATIVE
Influenza B by PCR: NEGATIVE
Resp Syncytial Virus by PCR: NEGATIVE
SARS Coronavirus 2 by RT PCR: NEGATIVE

## 2024-10-30 LAB — LACTIC ACID, PLASMA: Lactic Acid, Venous: 1.1 mmol/L (ref 0.5–1.9)

## 2024-10-30 MED ORDER — DONEPEZIL HCL 5 MG PO TABS
10.0000 mg | ORAL_TABLET | Freq: Every day | ORAL | Status: AC
  Administered 2024-10-30 – 2024-11-01 (×3): 10 mg via ORAL
  Filled 2024-10-30 (×3): qty 2

## 2024-10-30 MED ORDER — FERROUS SULFATE 325 (65 FE) MG PO TBEC: 325.0000 mg | DELAYED_RELEASE_TABLET | Freq: Every day | ORAL | Status: DC

## 2024-10-30 MED ORDER — SODIUM CHLORIDE 0.9 % IV SOLN: 2.0000 g | INTRAVENOUS | Status: DC

## 2024-10-30 MED ORDER — ACETAMINOPHEN 650 MG RE SUPP: 650.0000 mg | Freq: Four times a day (QID) | RECTAL | Status: AC | PRN

## 2024-10-30 MED ORDER — ACETAMINOPHEN 325 MG PO TABS
650.0000 mg | ORAL_TABLET | Freq: Once | ORAL | Status: AC
  Administered 2024-10-30: 650 mg via ORAL
  Filled 2024-10-30: qty 2

## 2024-10-30 MED ORDER — SODIUM CHLORIDE 0.9 % IV SOLN
2.0000 g | Freq: Once | INTRAVENOUS | Status: AC
  Administered 2024-10-30: 2 g via INTRAVENOUS
  Filled 2024-10-30: qty 20

## 2024-10-30 MED ORDER — HYDRALAZINE HCL 20 MG/ML IJ SOLN
5.0000 mg | Freq: Four times a day (QID) | INTRAMUSCULAR | Status: AC | PRN
  Administered 2024-10-30 – 2024-11-01 (×2): 5 mg via INTRAVENOUS
  Filled 2024-10-30 (×2): qty 1

## 2024-10-30 MED ORDER — SIMVASTATIN 20 MG PO TABS
20.0000 mg | ORAL_TABLET | Freq: Every day | ORAL | Status: AC
  Administered 2024-10-30 – 2024-11-01 (×3): 20 mg via ORAL
  Filled 2024-10-30: qty 1
  Filled 2024-10-30: qty 2
  Filled 2024-10-30: qty 1

## 2024-10-30 MED ORDER — SENNOSIDES-DOCUSATE SODIUM 8.6-50 MG PO TABS: 1.0000 | ORAL_TABLET | Freq: Every evening | ORAL | Status: AC | PRN

## 2024-10-30 MED ORDER — TORSEMIDE 20 MG PO TABS: 20.0000 mg | ORAL_TABLET | Freq: Every day | ORAL | Status: AC | PRN

## 2024-10-30 MED ORDER — VITAMIN B-12 1000 MCG PO TABS
500.0000 ug | ORAL_TABLET | Freq: Every day | ORAL | Status: AC
  Administered 2024-10-31 – 2024-11-01 (×2): 500 ug via ORAL
  Filled 2024-10-30: qty 1
  Filled 2024-10-30: qty 0.5

## 2024-10-30 MED ORDER — HEPARIN SODIUM (PORCINE) 5000 UNIT/ML IJ SOLN
5000.0000 [IU] | Freq: Three times a day (TID) | INTRAMUSCULAR | Status: AC
  Administered 2024-10-30 – 2024-11-01 (×7): 5000 [IU] via SUBCUTANEOUS
  Filled 2024-10-30 (×7): qty 1

## 2024-10-30 MED ORDER — SODIUM CHLORIDE 0.9 % IV BOLUS
500.0000 mL | Freq: Once | INTRAVENOUS | Status: AC
  Administered 2024-10-30: 500 mL via INTRAVENOUS

## 2024-10-30 MED ORDER — AMLODIPINE BESYLATE 5 MG PO TABS
10.0000 mg | ORAL_TABLET | Freq: Every day | ORAL | Status: AC
  Administered 2024-10-31 – 2024-11-01 (×2): 10 mg via ORAL
  Filled 2024-10-30: qty 2
  Filled 2024-10-30: qty 1

## 2024-10-30 MED ORDER — ASPIRIN 81 MG PO TBEC
81.0000 mg | DELAYED_RELEASE_TABLET | Freq: Every day | ORAL | Status: AC
  Administered 2024-10-31 – 2024-11-01 (×2): 81 mg via ORAL
  Filled 2024-10-30 (×2): qty 1

## 2024-10-30 MED ORDER — FERROUS SULFATE 325 (65 FE) MG PO TABS
325.0000 mg | ORAL_TABLET | Freq: Every day | ORAL | Status: AC
  Administered 2024-10-31 – 2024-11-01 (×2): 325 mg via ORAL
  Filled 2024-10-30 (×2): qty 1

## 2024-10-30 MED ORDER — VITAMIN D 25 MCG (1000 UNIT) PO TABS
1000.0000 [IU] | ORAL_TABLET | Freq: Every day | ORAL | Status: AC
  Administered 2024-10-31 – 2024-11-01 (×2): 1000 [IU] via ORAL
  Filled 2024-10-30 (×2): qty 1

## 2024-10-30 MED ORDER — ONDANSETRON HCL 4 MG/2ML IJ SOLN: 4.0000 mg | Freq: Four times a day (QID) | INTRAMUSCULAR | Status: AC | PRN

## 2024-10-30 MED ORDER — GABAPENTIN 300 MG PO CAPS
300.0000 mg | ORAL_CAPSULE | Freq: Two times a day (BID) | ORAL | Status: AC
  Administered 2024-10-30 – 2024-11-01 (×5): 300 mg via ORAL
  Filled 2024-10-30 (×5): qty 1

## 2024-10-30 MED ORDER — PANTOPRAZOLE SODIUM 40 MG PO TBEC
40.0000 mg | DELAYED_RELEASE_TABLET | Freq: Every day | ORAL | Status: AC
  Administered 2024-10-30 – 2024-11-01 (×3): 40 mg via ORAL
  Filled 2024-10-30 (×3): qty 1

## 2024-10-30 MED ORDER — MEMANTINE HCL 5 MG PO TABS
10.0000 mg | ORAL_TABLET | Freq: Two times a day (BID) | ORAL | Status: AC
  Administered 2024-10-30 – 2024-11-01 (×5): 10 mg via ORAL
  Filled 2024-10-30 (×5): qty 2

## 2024-10-30 MED ORDER — ACETAMINOPHEN 325 MG PO TABS: 650.0000 mg | ORAL_TABLET | Freq: Four times a day (QID) | ORAL | Status: AC | PRN

## 2024-10-30 MED ORDER — ONDANSETRON HCL 4 MG PO TABS: 4.0000 mg | ORAL_TABLET | Freq: Four times a day (QID) | ORAL | Status: AC | PRN

## 2024-10-30 NOTE — ED Provider Notes (Signed)
 "  [facility] Provider Note    Event Date/Time   First MD Initiated Contact with Patient 10/30/24 0932     (approximate)   History   Weakness   HPI  Nathan Rivera is a 79 y.o. male with a history of hypertension, hyperlipidemia, type 2 diabetes, AAA, DVT on Xarelto , and dementia who presents with achiness and elevated blood pressure.  The wife states that she checks the patient's blood pressure every morning.  This morning it was approximately 195/120 which concerned her.  She found that the patient was weak.  He was able to get up and go to the couch but then was too weak to get up out of the couch.  She noted that he had a low-grade temperature of 99.5 which is unusual for him.  The patient also had an episode in which he started to gag and then his eyes rolled back in his head and he became unconscious for a few seconds.  The patient currently denies any acute complaints.  He states he feels fine.  I reviewed the past medical records.  The patient's most recent outpatient encounter was on 1/21 with Dr. Ammon from cardiology for follow-up of his chronic conditions.   Physical Exam   Triage Vital Signs: ED Triage Vitals  Encounter Vitals Group     BP 10/30/24 0926 (!) 156/76     Girls Systolic BP Percentile --      Girls Diastolic BP Percentile --      Boys Systolic BP Percentile --      Boys Diastolic BP Percentile --      Pulse Rate 10/30/24 0926 74     Resp 10/30/24 0926 18     Temp 10/30/24 0926 99 F (37.2 C)     Temp Source 10/30/24 0926 Oral     SpO2 10/30/24 0926 97 %     Weight --      Height --      Head Circumference --      Peak Flow --      Pain Score 10/30/24 0927 0     Pain Loc --      Pain Education --      Exclude from Growth Chart --     Most recent vital signs: Vitals:   10/30/24 1308 10/30/24 1442  BP: (!) 176/78 (!) 152/79  Pulse: 79 69  Resp: 18 13  Temp: (!) 101.2 F (38.4 C)   SpO2: 93% 93%      General: Alert, oriented x 1, no distress.  CV:  Good peripheral perfusion.  Resp:  Normal effort.  Lungs CTAB. Abd:  No distention.  Other:  EOMI.  PERRLA.  No photophobia.  No facial droop.  Normal speech.  Motor intact in all extremities.  1+ bilateral lower extremity edema.  Dry mucous membranes.   ED Results / Procedures / Treatments   Labs (all labs ordered are listed, but only abnormal results are displayed) Labs Reviewed  COMPREHENSIVE METABOLIC PANEL WITH GFR - Abnormal; Notable for the following components:      Result Value   CO2 19 (*)    BUN 52 (*)    Creatinine, Ser 4.39 (*)    AST 14 (*)    GFR, Estimated 13 (*)    All other components within normal limits  CBC WITH DIFFERENTIAL/PLATELET - Abnormal; Notable for the following components:   RBC 3.67 (*)    Hemoglobin 10.4 (*)    HCT  32.8 (*)    RDW 16.8 (*)    Lymphs Abs 0.3 (*)    Monocytes Absolute 0.0 (*)    All other components within normal limits  URINALYSIS, ROUTINE W REFLEX MICROSCOPIC - Abnormal; Notable for the following components:   Color, Urine YELLOW (*)    APPearance HAZY (*)    Hgb urine dipstick MODERATE (*)    Protein, ur >=300 (*)    Nitrite POSITIVE (*)    Leukocytes,Ua MODERATE (*)    Bacteria, UA MANY (*)    All other components within normal limits  TROPONIN T, HIGH SENSITIVITY - Abnormal; Notable for the following components:   Troponin T High Sensitivity 68 (*)    All other components within normal limits  TROPONIN T, HIGH SENSITIVITY - Abnormal; Notable for the following components:   Troponin T High Sensitivity 58 (*)    All other components within normal limits  RESP PANEL BY RT-PCR (RSV, FLU A&B, COVID)  RVPGX2  CULTURE, BLOOD (ROUTINE X 2)  CULTURE, BLOOD (ROUTINE X 2)  LACTIC ACID, PLASMA     EKG  ED ECG REPORT I, Waylon Cassis, the attending physician, personally viewed and interpreted this ECG.  Date: 10/30/2024 EKG Time: 0926 Rate: 74 Rhythm: normal  sinus rhythm QRS Axis: normal Intervals: normal ST/T Wave abnormalities: Nonspecific ST abnormality Narrative Interpretation: no evidence of acute ischemia    RADIOLOGY  Chest x-ray: I independently viewed and interpreted the images; there is atelectasis with no focal consolidation or edema  CT head:  IMPRESSION:  1. No acute intracranial abnormality.  2. Advanced chronic small vessel disease, including a chronic microhemorrhage  in the posterior right hemisphere visible on both CT and prior MRI.    PROCEDURES:  Critical Care performed: No  Procedures   MEDICATIONS ORDERED IN ED: Medications  cefTRIAXone  (ROCEPHIN ) 2 g in sodium chloride  0.9 % 100 mL IVPB (2 g Intravenous New Bag/Given 10/30/24 1441)  sodium chloride  0.9 % bolus 500 mL (0 mLs Intravenous Stopped 10/30/24 1304)  acetaminophen  (TYLENOL ) tablet 650 mg (650 mg Oral Given 10/30/24 1345)     IMPRESSION / MDM / ASSESSMENT AND PLAN / ED COURSE  I reviewed the triage vital signs and the nursing notes.  79 year old male with PMH as noted above presents with generalized weakness, elevated blood pressure, and a low-grade temperature.  On exam the blood pressure is improved.  Other vital signs are normal.  Physical exam is overall unremarkable.  Differential diagnosis includes, but is not limited to, influenza, COVID, other viral syndrome, UTI, pneumonia, other acute infection, dehydration, electrolyte abnormality, AKI, less likely hypertensive emergency or cardiac etiology.  Differential for the episode of loss of consciousness includes syncope and seizure.  We will obtain chest x-ray, CT head, lab workup, and reassess.  Patient's presentation is most consistent with acute presentation with potential threat to life or bodily function.  The patient is on the cardiac monitor to evaluate for evidence of arrhythmia and/or significant heart rate changes.   ----------------------------------------- 2:47 PM on  10/30/2024 -----------------------------------------  CT head is negative for acute findings.  Chest x-ray is clear.  Respiratory panel is negative.  However the urinalysis does show findings compatible with a UTI.  There is no evidence of sepsis.  Lactate is normal.  CBC and CMP are unremarkable.  I have ordered IV ceftriaxone .  Given the patient's weakness and inability to ambulate he will need admission.  I consulted Dr. Sherre from the hospitalist service; based on our discussion  she agrees to evaluate the patient for admission.  FINAL CLINICAL IMPRESSION(S) / ED DIAGNOSES   Final diagnoses:  Urinary tract infection without hematuria, site unspecified     Rx / DC Orders   ED Discharge Orders     None        Note:  This document was prepared using Dragon voice recognition software and may include unintentional dictation errors.    Jacolyn Pae, MD 10/30/24 1447  "

## 2024-10-30 NOTE — ED Notes (Signed)
 First POC with patient. Bed locked in lowest position with rails raised X2. Wife at bedside, call bell within reach.

## 2024-10-30 NOTE — H&P (Signed)
 " History and Physical   CREIG LANDIN FMW:[id number] DOB: 1946/02/14 DOA: 10/30/2024  PCP: Jeffie Cheryl BRAVO, MD  Patient coming from: Home  I have personally briefly reviewed patient's old medical records in [facility] EMR.  Chief Concern: Altered mental status  HPI: Mr. Nathan Rivera is a 79 year old male with history of CKD stage IV, chronic microhemorrhages of the brain, dementia, hyperlipidemia.  10/30/24: he presents for chief concerns of elevated blood pressure and weakness and altered mental status.  Vitals in the ED showed t 101.2, rr of 13, hr 70, blood pressure 153/79, SpO2 91% on room air.  Serum sodium is 144, potassium 4.3, chloride 111, bicarb 19, BUN of 52, sCr 4.39, eGFR 13 nonfasting blood glucose 78, WBC 5.5, hemoglobin 10.4, platelets of 166.  COVID/influenza A/influenza B/RSV PCR were negative.  UA was positive for moderate leukocytes, and nitrates; protein greater than or equal to 300.  ED treatment: Acetaminophen  650 mg p.o. one-time dose, ceftriaxone  2 g IV one-time dose, sodium chloride  500 mL liter bolus. ------------------------------------- At bedside, patient was able to tell me his first and last name.  He was not able to accurately tell me his age, stating that he is 79 years old.  He was not able to tell me the current calendar year or the current month.  He states that he is in the clinic at this time.  He states that the lady next to him is the left is his life.  He then called her Devere which is the name of his deceased sister.  His wife, at bedside is named Facilities Manager.  Spouse Verneita states that he had some gagging cough with a temperature of 99.5 and decreased cognition compared to baseline this morning.  She reports she does not know if he has had polyuria or decreased urine output as he wears depends.  There has been no reported nausea, vomiting, chest pain, shortness of breath, abdominal pain.  Social history: He lives at home with his wife.  He  does not use tobacco, EtOH, recreational drug use.  He is retired.  ROS: Unable to accurately complete due to patient with dementia, likely advanced.  ED Course: Discussed with EDP, patient requiring hospitalization for chief concerns of altered mental status, with fever secondary to UTI.  Assessment/Plan  Principal Problem:   Altered mental status Active Problems:   UTI (urinary tract infection)   CKD (chronic kidney disease), stage IV (HCC)   Hypertension   Hypercholesterolemia   Alzheimer dementia (HCC)   GERD (gastroesophageal reflux disease)   Assessment and Plan:  * Altered mental status Suspect secondary to urinary tract infection Treat with ceftriaxone  2 g IV daily Telemetry, inpatient  UTI (urinary tract infection) Ceftriaxone  2 g IV daily to complete a 5-day course ordered  CKD (chronic kidney disease), stage IV (HCC) At baseline  GERD (gastroesophageal reflux disease) PPI resumed  Alzheimer dementia (HCC) Home donepezil  10 mg nightly, memantine  10 mg p.o. twice daily were resumed  Hypercholesterolemia Home simvastatin  20 mg nightly resumed  Hypertension Home amlodipine  10 mg daily resumed  Chart reviewed.   DVT prophylaxis: Heparin  5000 units subcutaneous every 8 hours Code Status: Full code Diet: Heart healthy Family Communication: Spouse, has been updated at bedside Disposition Plan: Pending clinical course Consults called: None at this time Admission status: Telemetry, inpatient  Past Medical History:  Diagnosis Date   Alzheimer dementia (HCC)    Aortic aneurysm    Arthritis    knees are the biggest problem  Cancer [facility]) 2012   bladder   CKD (chronic kidney disease), stage III (HCC) 2014   relative to infection, septic- renal calculi   Degenerative disc disease    Degenerative joint disease    GERD (gastroesophageal reflux disease)    History of kidney stones    Hypercholesterolemia    Hypertension    no meds currently   Impaired  glucose tolerance    Muscle fatigue    muscle fatigue relative to sepsis experience    Peripheral vascular disease    Pneumonia    Raynaud's disease    Sepsis (HCC) 07/2013   Stroke (HCC)    Thromboembolism of left popilteal vein 2015   pt. unsure which leg, treating with Xarelto    UTI (urinary tract infection) 08/17/2013   Lt nephrostomy tube and stent placement   Past Surgical History:  Procedure Laterality Date   ABDOMINAL AORTIC ANEURYSM REPAIR  2011   CATARACT EXTRACTION W/PHACO Left 02/15/2021   Procedure: CATARACT EXTRACTION PHACO AND INTRAOCULAR LENS PLACEMENT (IOC) LEFT 5.59 00:44.0;  Surgeon: Myrna Adine Anes, MD;  Location: [facility];  Service: Ophthalmology;  Laterality: Left;   CATARACT EXTRACTION W/PHACO Right 03/08/2021   Procedure: CATARACT EXTRACTION PHACO AND INTRAOCULAR LENS PLACEMENT (IOC) RIGHT 1.81 00:22.7;  Surgeon: Myrna Adine Anes, MD;  Location: [facility];  Service: Ophthalmology;  Laterality: Right;   COLONOSCOPY N/A 06/28/2024   Procedure: COLONOSCOPY;  Surgeon: Jinny Carmine, MD;  Location: [facility] ENDOSCOPY;  Service: Endoscopy;  Laterality: N/A;  ED-32   ENDOVASCULAR STENT GRAFT (AAA) N/A 04/26/2024   Procedure: ENDOVASCULAR REPAIR/STENT GRAFT;  Surgeon: Jama Cordella MATSU, MD;  Location: [facility] INVASIVE CV LAB;  Service: Cardiovascular;  Laterality: N/A;   ESOPHAGOGASTRODUODENOSCOPY N/A 04/16/2024   Procedure: EGD (ESOPHAGOGASTRODUODENOSCOPY);  Surgeon: Jinny Carmine, MD;  Location: [facility] ENDOSCOPY;  Service: Endoscopy;  Laterality: N/A;   HERNIA REPAIR     8mo old   INSERTION OF MESH N/A 10/24/2014   Procedure: INSERTION OF MESH;  Surgeon: Deward Null III, MD;  Location: [facility] OR;  Service: General;  Laterality: N/A;   LAPAROTOMY N/A 08/24/2013   Procedure: EXPLORATORY LAPAROTOMY;  Surgeon: Deward GORMAN Null DOUGLAS, MD;  Location: [facility] OR;  Service: General;  Laterality: N/A;  Gram patch closure   litrotripsy  09/24/2013   NEPHROSTOMY  Nov. 2014    nephrostomy removal  Dec. 2014   PERIPHERALLY INSERTED CENTRAL CATHETER INSERTION     RENAL ANGIOGRAPHY Bilateral 04/19/2024   Procedure: RENAL ANGIOGRAPHY;  Surgeon: Marea Selinda GORMAN, MD;  Location: [facility] INVASIVE CV LAB;  Service: Cardiovascular;  Laterality: Bilateral;   TONSILLECTOMY     UMBILICAL HERNIA REPAIR     50 yr ago   VENTRAL HERNIA REPAIR N/A 10/24/2014   Procedure: LAPAROSCOPIC ASSISTED VENTRAL HERNIA REPAIR WITH MESH;  Surgeon: Deward Null III, MD;  Location: [facility] OR;  Service: General;  Laterality: N/A;   XI ROBOTIC ASSISTED INGUINAL HERNIA REPAIR WITH MESH Bilateral 11/17/2022   Procedure: XI ROBOTIC ASSISTED INGUINAL HERNIA REPAIR WITH MESH-Bilateral;  Surgeon: Desiderio Schanz, MD;  Location: [facility] ORS;  Service: General;  Laterality: Bilateral;   Social History:  reports that he has quit smoking. His smoking use included cigarettes. He has a 40 pack-year smoking history. He has been exposed to tobacco smoke. He has never used smokeless tobacco. He reports current alcohol use of about 3.0 standard drinks of alcohol per week. He reports that he does not use drugs.  Allergies[1] Family History  Problem Relation Age of Onset  CVA Father    Atrial fibrillation Sister    Family history: Family history reviewed and not pertinent.  Prior to Admission medications  Medication Sig Start Date End Date Taking? Authorizing Provider  acetaminophen  (TYLENOL ) 325 MG tablet Take 325 mg by mouth every 6 (six) hours as needed for mild pain.    [provider]  amLODipine  (NORVASC ) 10 MG tablet Take 1 tablet (10 mg total) by mouth daily. 04/30/24 09/13/24  Von Bellis, MD  aspirin  EC 81 MG tablet Take 1 tablet (81 mg total) by mouth daily. Swallow whole. 05/01/24   Von Bellis, MD  bisacodyl  (DULCOLAX) 5 MG EC tablet Take 2 tablets (10 mg total) by mouth at bedtime. Patient taking differently: Take 10 mg by mouth daily as needed for moderate constipation. 04/30/24   Von Bellis, MD   cholecalciferol  (VITAMIN D ) 1000 UNITS tablet Take 1,000 Units by mouth daily.    [provider]  donepezil  (ARICEPT ) 10 MG tablet Take 10 mg by mouth at bedtime.    [provider]  ferrous sulfate  325 (65 FE) MG EC tablet Take 1 tablet (325 mg total) by mouth daily with breakfast. 06/28/24 06/28/25  Caleen Qualia, MD  FOLIC ACID PO Take by mouth.    [provider]  gabapentin  (NEURONTIN ) 100 MG capsule 200 mg 2 (two) times daily. 200 mg in am and 200 mg in PM 04/01/17   [provider]  memantine  (NAMENDA ) 10 MG tablet TAKE 1 TABLET BY MOUTH TWICE DAILY FOR MEMORY LOSS 02/28/24   [provider]  pantoprazole  (PROTONIX ) 40 MG tablet Take 40 mg by mouth at bedtime.  11/13/13   [provider]  polyethylene glycol powder (GLYCOLAX /MIRALAX ) 17 GM/SCOOP powder Take 17 g by mouth 2 (two) times daily. Patient not taking: Reported on 09/13/2024 04/30/24   Von Bellis, MD  Rivaroxaban  (XARELTO ) 15 MG TABS tablet Take 1 tablet (15 mg total) by mouth daily with supper. 06/28/24   Amin, Sumayya, MD  Rivaroxaban  (XARELTO ) 15 MG TABS tablet Take 1 tablet (15 mg total) by mouth daily with supper for 7 days. 06/28/24 09/13/24  Amin, Sumayya, MD  simvastatin  (ZOCOR ) 20 MG tablet Take 20 mg by mouth at bedtime.    [provider]  vitamin B-12 (CYANOCOBALAMIN ) 500 MCG tablet Take 500 mcg by mouth daily.    [provider]   Physical Exam: Vitals:   10/30/24 1442 10/30/24 1700 10/30/24 1741 10/30/24 1800  BP: (!) 152/79 139/85 (!) 153/98   Pulse: 69 68 69   Resp: 13 13 18    Temp:    98.8 F (37.1 C)  TempSrc:    Oral  SpO2: 93% 95% 99%    Constitutional: appears appropriate, NAD, calm Eyes: PERRL, lids and conjunctivae normal ENMT: Mucous membranes are moist. Posterior pharynx clear of any exudate or lesions. Age-appropriate dentition. Hearing appropriate Neck: normal, supple, no masses, no thyromegaly Respiratory: clear to auscultation  bilaterally, no wheezing, no crackles. Normal respiratory effort. No accessory muscle use.  Cardiovascular: Regular rate and rhythm, no murmurs / rubs / gallops. No extremity edema. 2+ pedal pulses. No carotid bruits.  Abdomen: no tenderness, no masses palpated, no hepatosplenomegaly. Bowel sounds positive.  Musculoskeletal: no clubbing / cyanosis. No joint deformity upper and lower extremities. Good ROM, no contractures, no atrophy. Normal muscle tone.  Skin: no rashes, lesions, ulcers. No induration Neurologic: Sensation intact. Strength 5/5 in all 4.  Psychiatric: Normal judgment and insight. Alert and oriented x 3. Normal mood.  EKG: independently reviewed, showing sinus rhythm with rate of 74, QTc 441  Chest x-ray on Admission: I personally reviewed and I agree with radiologist reading as below.  CT Head Wo Contrast Result Date: 10/30/2024 EXAM: CT HEAD WITHOUT CONTRAST 10/30/2024 10:17:21 AM TECHNIQUE: CT of the head was performed without the administration of intravenous contrast. Automated exposure control, iterative reconstruction, and/or weight based adjustment of the mA/kV was utilized to reduce the radiation dose to as low as reasonably achievable. COMPARISON: Brain MRI 01/29/2024. CLINICAL HISTORY: 79 year old male. New-onset seizure, no history of trauma, weakness, hypertensive, low grade fever. FINDINGS: BRAIN AND VENTRICLES: Stable brain volume. No evidence of acute infarct. No hydrocephalus. No extra-axial collection. No mass effect or midline shift. Microhemorrhages on MRI last year were primarily in the left occipital lobe. However, a focus of susceptibility on that exam (series 10 image 28) appears to correspond to small round 4 mm hyperdense lesion at the junction of the right posterior temporal and occipital lobes today. No edema or mass effect there. And confluent bilateral cerebral white matter hypodensity appears stable from MRI signal changes last year. And a small area of  cortical encephalomalacia in the left parietal lobe is stable on series 2 image 20. No acute hemorrhage. Calcified atherosclerosis at the skull base. ORBITS: Visible paranasal sinuses, tympanic cavities and mastoids are well aerated. No gaze deviation. SOFT TISSUES AND SKULL: No acute soft tissue abnormality. No skull fracture. IMPRESSION: 1. No acute intracranial abnormality. 2. Advanced chronic small vessel disease, including a chronic microhemorrhage in the posterior right hemisphere visible on both CT and prior MRI. Electronically signed by: Helayne Hurst MD 10/30/2024 10:38 AM EST RP Workstation: [id number]   DG Chest Port 1 View Result Date: 10/30/2024 CLINICAL DATA:  Hypertension, low-grade fever. Altered mental status. EXAM: PORTABLE CHEST 1 VIEW COMPARISON:  04/18/2024 and CT chest 04/15/2024. FINDINGS: Trachea is midline. Heart size stable. Lungs are somewhat low in volume with streaky densities in the lung bases. No dense airspace consolidation fluid. Right hemidiaphragm is slightly elevated unchanged. IMPRESSION: Somewhat low lung volumes with bibasilar atelectasis and/or scarring. Electronically Signed   By: Newell Eke M.D.   On: 10/30/2024 10:21   Labs on Admission: I have personally reviewed following labs  CBC: Recent Labs  Lab 10/30/24 0935  WBC 5.5  NEUTROABS 5.1  HGB 10.4*  HCT 32.8*  MCV 89.4  PLT 166   Basic Metabolic Panel: Recent Labs  Lab 10/30/24 0935  NA 144  K 4.3  CL 111  CO2 19*  GLUCOSE 78  BUN 52*  CREATININE 4.39*  CALCIUM 9.0   GFR: CrCl cannot be calculated (Unknown ideal weight.).  Liver Function Tests: Recent Labs  Lab 10/30/24 0935  AST 14*  ALT 10  ALKPHOS 64  BILITOT 0.4  PROT 6.9  ALBUMIN  3.9   Urine analysis:    Component Value Date/Time   COLORURINE YELLOW (A) 10/30/2024 1315   APPEARANCEUR HAZY (A) 10/30/2024 1315   APPEARANCEUR Cloudy 08/17/2013 1922   LABSPEC 1.010 10/30/2024 1315   LABSPEC 1.013 08/17/2013 1922    PHURINE 5.0 10/30/2024 1315   GLUCOSEU NEGATIVE 10/30/2024 1315   GLUCOSEU Negative 08/17/2013 1922   HGBUR MODERATE (A) 10/30/2024 1315   BILIRUBINUR NEGATIVE 10/30/2024 1315   BILIRUBINUR Negative 08/17/2013 1922   KETONESUR NEGATIVE 10/30/2024 1315   PROTEINUR >=300 (A) 10/30/2024 1315   UROBILINOGEN 1.0 08/18/2013 1713   NITRITE POSITIVE (A) 10/30/2024 1315   LEUKOCYTESUR MODERATE (A) 10/30/2024 1315   LEUKOCYTESUR 1+  08/17/2013 1922   This document was prepared using Conservation [employer] and may include unintentional dictation errors.  Dr. Sherre [facility]  If 7PM-7AM, please contact overnight-coverage provider If 7AM-7PM, please contact day attending provider [email or web address]  10/30/2024, 6:20 PM      [1]  Allergies Allergen Reactions   Duloxetine Dermatitis   "

## 2024-10-30 NOTE — ED Notes (Signed)
 This RN did a full linen change on pt, new briefs on pt, sitting comfortably in bed.

## 2024-10-30 NOTE — Hospital Course (Addendum)
 Mr. Nathan Rivera is a 79 year old male with history of CKD stage IV, chronic microhemorrhages of the brain, dementia, hyperlipidemia.  10/30/24: he presents for chief concerns of elevated blood pressure and weakness and altered mental status.  Vitals in the ED showed t 101.2, rr of 13, hr 70, blood pressure 153/79, SpO2 91% on room air.  Serum sodium is 144, potassium 4.3, chloride 111, bicarb 19, BUN of 52, sCr 4.39, eGFR 13 nonfasting blood glucose 78, WBC 5.5, hemoglobin 10.4, platelets of 166.  COVID/influenza A/influenza B/RSV PCR were negative.  UA was positive for moderate leukocytes, and nitrates; protein greater than or equal to 300.  ED treatment: Acetaminophen  650 mg p.o. one-time dose, ceftriaxone  2 g IV one-time dose, sodium chloride  500 mL liter bolus.

## 2024-10-30 NOTE — Assessment & Plan Note (Signed)
 Ceftriaxone  2 g IV daily to complete a 5-day course ordered

## 2024-10-30 NOTE — Assessment & Plan Note (Signed)
 Home donepezil 10 mg nightly, memantine 10 mg p.o. twice daily were resumed

## 2024-10-30 NOTE — Assessment & Plan Note (Signed)
Home simvastatin 20 mg nightly resumed

## 2024-10-30 NOTE — Assessment & Plan Note (Signed)
 At baseline

## 2024-10-30 NOTE — Assessment & Plan Note (Signed)
 PPI resumed

## 2024-10-30 NOTE — ED Triage Notes (Signed)
 BIBEMS from home. Pt's wife called d/t pt being weak this morning, high BP, and low-grade fever. Per wife pt had two episodes this morning where they had a blank stare and their eyes went to the back of their head, but came back to baseline seconds later. No Hx of seizures. Pt has hx of Alzheimer's, A&Ox1 at baseline. Hx of HTN, took his home BP meds at 0730. Wife also gave pt 325 mg of tylenol  at home. Pt uses walker at baseline, unable to use today d/t weakness.   EMS VS:  186/- BP 98.8 F 99 CBG 76 HR 94% on RA 12 RR

## 2024-10-30 NOTE — Assessment & Plan Note (Signed)
Home amlodipine 10 mg daily resumed

## 2024-10-30 NOTE — Assessment & Plan Note (Signed)
 Suspect secondary to urinary tract infection Treat with ceftriaxone  2 g IV daily Telemetry, inpatient

## 2024-10-31 LAB — BLOOD CULTURE ID PANEL (REFLEXED) - BCID2

## 2024-10-31 LAB — BASIC METABOLIC PANEL WITH GFR
Anion gap: 11 (ref 5–15)
BUN: 52 mg/dL — ABNORMAL HIGH (ref 8–23)
CO2: 19 mmol/L — ABNORMAL LOW (ref 22–32)
Calcium: 8.5 mg/dL — ABNORMAL LOW (ref 8.9–10.3)
Chloride: 112 mmol/L — ABNORMAL HIGH (ref 98–111)
Creatinine, Ser: 4.27 mg/dL — ABNORMAL HIGH (ref 0.61–1.24)
GFR, Estimated: 13 mL/min — ABNORMAL LOW
Glucose, Bld: 82 mg/dL (ref 70–99)
Potassium: 4.6 mmol/L (ref 3.5–5.1)
Sodium: 142 mmol/L (ref 135–145)

## 2024-10-31 LAB — CBC
HCT: 28.2 % — ABNORMAL LOW (ref 39.0–52.0)
Hemoglobin: 9.1 g/dL — ABNORMAL LOW (ref 13.0–17.0)
MCH: 29 pg (ref 26.0–34.0)
MCHC: 32.3 g/dL (ref 30.0–36.0)
MCV: 89.8 fL (ref 80.0–100.0)
Platelets: 132 10*3/uL — ABNORMAL LOW (ref 150–400)
RBC: 3.14 MIL/uL — ABNORMAL LOW (ref 4.22–5.81)
RDW: 17.1 % — ABNORMAL HIGH (ref 11.5–15.5)
WBC: 10.2 10*3/uL (ref 4.0–10.5)
nRBC: 0 % (ref 0.0–0.2)

## 2024-10-31 MED ORDER — SODIUM CHLORIDE 0.9 % IV SOLN
500.0000 mg | Freq: Two times a day (BID) | INTRAVENOUS | Status: AC
  Administered 2024-10-31 – 2024-11-01 (×3): 500 mg via INTRAVENOUS
  Filled 2024-10-31 (×5): qty 10

## 2024-10-31 MED ORDER — SODIUM CHLORIDE 0.9 % IV SOLN
1.0000 g | Freq: Once | INTRAVENOUS | Status: AC
  Administered 2024-10-31: 1 g via INTRAVENOUS
  Filled 2024-10-31: qty 20

## 2024-10-31 NOTE — Progress Notes (Addendum)
 " Progress Note   Patient: Nathan Rivera FMW:[id number] DOB: 17-Jan-1946 DOA: 10/30/2024  DOS: the patient was seen and examined on 10/31/2024   Brief hospital course:  79 year old male with history of CKD stage IV, chronic microhemorrhages of the brain, dementia, hyperlipidemia presenting with weakness and altered mental status.  Found to have UTI.  Assessment and Plan:  Acute metabolic encephalopathy with underlying dementia - Likely exacerbated by underlying dementia as well as acute UTI and mild volume depletion.  Appears to be showing improvement this morning.  Patient is conversant, family at bedside.  Continue donepezil /memantine .  Concern for sepsis with ESBL bacteremia (POA) - Fever, encephalopathy, noted UTI on presentation given concern for sepsis.  Blood cultures, IV fluid bolus, empiric antibiotics initiated.  Monitoring blood pressure closely.  Blood cultures now showing gram-negative rods with reflex panel suggesting ESBL.  Appears to be resolving.  Acute cystitis without hematuria, concern for ESBL - UA noting nitrites, LE, WBCs, bacteria, likely source of patient's bacteremia.  Patient with history of ESBL in the past.  Placed on empiric meropenem  for now.  Await blood/urine cultures and susceptibilities.  No lactic acidosis, leukocytosis, and afebrile since presentation.  Acute kidney injury on CKD 4 (POA) - Patient creatinine as low as 2.34 in August, up to 3.2 in October, now up to 4.39 on presentation.  4.27 this morning.  Likely prerenal etiology.  IV fluid bolus given on presentation.  Monitor urine output and will recheck BMP in AM.  HTN/HLD/GERD - Home medication regiment resumed   Subjective: Patient sitting up in bed, wife at bedside, daughter via telephone.  Patient is more alert, talkative.  Has some underlying memory issues but otherwise states he feels improved.  Denies any fever, shortness of breath, chest pain, nausea, vomiting, abdominal pain.  Denies any  difficulty with urination or urgency.  Per family report, only noted fever and increased blood pressure with confusion prior to coming in.  Physical Exam:  Vitals:   10/31/24 0729 10/31/24 0930 10/31/24 0941 10/31/24 1144  BP:  (!) 150/74 (!) 141/85 (!) 155/75  Pulse:  63  (!) 58  Resp:  20  16  Temp: 98.2 F (36.8 C)   98.7 F (37.1 C)  TempSrc: Oral   Oral  SpO2:  99%  98%  Weight:        GENERAL:  Alert, pleasant, no acute distress  HEENT:  EOMI CARDIOVASCULAR:  RRR, no murmurs appreciated RESPIRATORY:  Clear to auscultation, no wheezing, rales, or rhonchi GASTROINTESTINAL:  Soft, nontender, nondistended EXTREMITIES:  No LE edema bilaterally NEURO: Memory issues, no new focal deficits appreciated SKIN:  No rashes noted PSYCH:  Appropriate mood and affect     Data Reviewed:  Imaging Studies: CT Head Wo Contrast Result Date: 10/30/2024 EXAM: CT HEAD WITHOUT CONTRAST 10/30/2024 10:17:21 AM TECHNIQUE: CT of the head was performed without the administration of intravenous contrast. Automated exposure control, iterative reconstruction, and/or weight based adjustment of the mA/kV was utilized to reduce the radiation dose to as low as reasonably achievable. COMPARISON: Brain MRI 01/29/2024. CLINICAL HISTORY: 79 year old male. New-onset seizure, no history of trauma, weakness, hypertensive, low grade fever. FINDINGS: BRAIN AND VENTRICLES: Stable brain volume. No evidence of acute infarct. No hydrocephalus. No extra-axial collection. No mass effect or midline shift. Microhemorrhages on MRI last year were primarily in the left occipital lobe. However, a focus of susceptibility on that exam (series 10 image 28) appears to correspond to small round 4 mm hyperdense lesion at the  junction of the right posterior temporal and occipital lobes today. No edema or mass effect there. And confluent bilateral cerebral white matter hypodensity appears stable from MRI signal changes last year. And a small  area of cortical encephalomalacia in the left parietal lobe is stable on series 2 image 20. No acute hemorrhage. Calcified atherosclerosis at the skull base. ORBITS: Visible paranasal sinuses, tympanic cavities and mastoids are well aerated. No gaze deviation. SOFT TISSUES AND SKULL: No acute soft tissue abnormality. No skull fracture. IMPRESSION: 1. No acute intracranial abnormality. 2. Advanced chronic small vessel disease, including a chronic microhemorrhage in the posterior right hemisphere visible on both CT and prior MRI. Electronically signed by: Helayne Hurst MD 10/30/2024 10:38 AM EST RP Workstation: [id number]   DG Chest Port 1 View Result Date: 10/30/2024 CLINICAL DATA:  Hypertension, low-grade fever. Altered mental status. EXAM: PORTABLE CHEST 1 VIEW COMPARISON:  04/18/2024 and CT chest 04/15/2024. FINDINGS: Trachea is midline. Heart size stable. Lungs are somewhat low in volume with streaky densities in the lung bases. No dense airspace consolidation fluid. Right hemidiaphragm is slightly elevated unchanged. IMPRESSION: Somewhat low lung volumes with bibasilar atelectasis and/or scarring. Electronically Signed   By: Newell Eke M.D.   On: 10/30/2024 10:21    There are no new results to review at this time.  Previous records (including but not limited to H&P, progress notes, nursing notes, TOC management) were reviewed in assessment of this patient.  Labs: CBC: Recent Labs  Lab 10/30/24 0935 10/31/24 0010  WBC 5.5 10.2  NEUTROABS 5.1  --   HGB 10.4* 9.1*  HCT 32.8* 28.2*  MCV 89.4 89.8  PLT 166 132*   Basic Metabolic Panel: Recent Labs  Lab 10/30/24 0935 10/31/24 0010  NA 144 142  K 4.3 4.6  CL 111 112*  CO2 19* 19*  GLUCOSE 78 82  BUN 52* 52*  CREATININE 4.39* 4.27*  CALCIUM 9.0 8.5*   Liver Function Tests: Recent Labs  Lab 10/30/24 0935  AST 14*  ALT 10  ALKPHOS 64  BILITOT 0.4  PROT 6.9  ALBUMIN  3.9   CBG: No results for input(s): GLUCAP in the last  168 hours.  Scheduled Meds:  amLODipine   10 mg Oral Daily   aspirin  EC  81 mg Oral Daily   cholecalciferol   1,000 Units Oral Daily   cyanocobalamin   500 mcg Oral Daily   donepezil   10 mg Oral QHS   ferrous sulfate   325 mg Oral Daily   gabapentin   300 mg Oral BID   heparin   5,000 Units Subcutaneous Q8H   memantine   10 mg Oral BID   pantoprazole   40 mg Oral QHS   simvastatin   20 mg Oral QHS   Continuous Infusions:  meropenem  (MERREM ) IV     PRN Meds:.acetaminophen  **OR** acetaminophen , hydrALAZINE , ondansetron  **OR** ondansetron  (ZOFRAN ) IV, senna-docusate, torsemide   Family Communication: At bedside and via telephone  Disposition: Status is: Inpatient Remains inpatient appropriate because: ESBL bacteremia     Time spent: 42 minutes  Length of inpatient stay: 1 days  Author: Carliss LELON Canales, DO 10/31/2024 1:06 PM  For on call review [email or web address].   "

## 2024-10-31 NOTE — Hospital Course (Signed)
 79 year old male with history of CKD stage IV, chronic microhemorrhages of the brain, dementia, hyperlipidemia presenting with weakness and altered mental status.  Found to have UTI.   Assessment and Plan:   Acute metabolic encephalopathy with underlying dementia - Likely exacerbated by underlying dementia as well as acute UTI and mild volume depletion.  Continues showing improvement this morning.  Patient is conversant, family at bedside.  Continue donepezil /memantine .   Concern for sepsis with ESBL E. coli bacteremia (POA) - Fever, encephalopathy, noted UTI on presentation given concern for sepsis.  Blood cultures, IV fluid bolus, empiric antibiotics initiated.  Monitoring blood pressure closely.  Blood cultures now showing E. coli with reflex panel suggesting ESBL.  Appears to be resolving.   Acute cystitis without hematuria, concern for ESBL - UA noting nitrites, LE, WBCs, bacteria, likely source of patient's bacteremia.  Patient with history of ESBL in the past.  Placed on empiric meropenem  for now (day 2 of 3-5).  Await blood/urine cultures and susceptibilities.  No lactic acidosis, leukocytosis, and afebrile since presentation.   Acute kidney injury on CKD 4 (POA) - Patient creatinine as low as 2.34 in August, up to 3.2 in October, now up to 4.39 on presentation.  4.04 this morning.  Likely prerenal etiology.  IV fluid bolus given on presentation.  Monitor urine output and will recheck BMP in AM.   HTN/HLD/GERD - Home medication regiment resumed   Physical debilitation muscle weakness - Evaluation by PT/OT.  Initial recommendation for STR.  However patient showing improvement this morning.  Will continue to reevaluate.

## 2024-10-31 NOTE — Progress Notes (Signed)
 PHARMACY - PHYSICIAN COMMUNICATION CRITICAL VALUE ALERT - BLOOD CULTURE IDENTIFICATION (BCID)  Results for orders placed or performed during the hospital encounter of 10/30/24  Resp panel by RT-PCR (RSV, Flu A&B, Covid) Anterior Nasal Swab     Status: None   Collection Time: 10/30/24 10:50 AM   Specimen: Anterior Nasal Swab  Result Value Ref Range Status   SARS Coronavirus 2 by RT PCR NEGATIVE NEGATIVE Final    Comment: (NOTE) SARS-CoV-2 target nucleic acids are NOT DETECTED.  The SARS-CoV-2 RNA is generally detectable in upper respiratory specimens during the acute phase of infection. The lowest concentration of SARS-CoV-2 viral copies this assay can detect is 138 copies/mL. A negative result does not preclude SARS-Cov-2 infection and should not be used as the sole basis for treatment or other patient management decisions. A negative result may occur with  improper specimen collection/handling, submission of specimen other than nasopharyngeal swab, presence of viral mutation(s) within the areas targeted by this assay, and inadequate number of viral copies(<138 copies/mL). A negative result must be combined with clinical observations, patient history, and epidemiological information. The expected result is Negative.  Fact Sheet for Patients:  bloggercourse.com  Fact Sheet for Healthcare Providers:  seriousbroker.it  This test is no t yet approved or cleared by the [place]  FDA and  has been authorized for detection and/or diagnosis of SARS-CoV-2 by FDA under an Emergency Use Authorization (EUA). This EUA will remain  in effect (meaning this test can be used) for the duration of the COVID-19 declaration under Section 564(b)(1) of the Act, 21 U.S.C.section 360bbb-3(b)(1), unless the authorization is terminated  or revoked sooner.       Influenza A by PCR NEGATIVE NEGATIVE Final   Influenza B by PCR NEGATIVE NEGATIVE Final     Comment: (NOTE) The Xpert Xpress SARS-CoV-2/FLU/RSV plus assay is intended as an aid in the diagnosis of influenza from Nasopharyngeal swab specimens and should not be used as a sole basis for treatment. Nasal washings and aspirates are unacceptable for Xpert Xpress SARS-CoV-2/FLU/RSV testing.  Fact Sheet for Patients: bloggercourse.com  Fact Sheet for Healthcare Providers: seriousbroker.it  This test is not yet approved or cleared by the [place]  FDA and has been authorized for detection and/or diagnosis of SARS-CoV-2 by FDA under an Emergency Use Authorization (EUA). This EUA will remain in effect (meaning this test can be used) for the duration of the COVID-19 declaration under Section 564(b)(1) of the Act, 21 U.S.C. section 360bbb-3(b)(1), unless the authorization is terminated or revoked.     Resp Syncytial Virus by PCR NEGATIVE NEGATIVE Final    Comment: (NOTE) Fact Sheet for Patients: bloggercourse.com  Fact Sheet for Healthcare Providers: seriousbroker.it  This test is not yet approved or cleared by the [place]  FDA and has been authorized for detection and/or diagnosis of SARS-CoV-2 by FDA under an Emergency Use Authorization (EUA). This EUA will remain in effect (meaning this test can be used) for the duration of the COVID-19 declaration under Section 564(b)(1) of the Act, 21 U.S.C. section 360bbb-3(b)(1), unless the authorization is terminated or revoked.  Performed at [facility], [address] [place], [place] [address]   Culture, blood (routine x 2)     Status: None (Preliminary result)   Collection Time: 10/30/24 10:50 AM   Specimen: BLOOD  Result Value Ref Range Status   Specimen Description BLOOD BLOOD RIGHT ARM  Final   Special Requests   Final    BOTTLES DRAWN AEROBIC AND ANAEROBIC Blood  Culture adequate volume   Culture  Setup  Time   Final    GRAM NEGATIVE RODS AEROBIC BOTTLE ONLY Organism ID to follow CRITICAL RESULT CALLED TO, READ BACK BY AND VERIFIED WITH:  Danyael Alipio AT 0038 10/31/24 JG Performed at [facility], [address] [place], [place] [address]    Culture PENDING  Incomplete   Report Status PENDING  Incomplete  Blood Culture ID Panel (Reflexed)     Status: Abnormal   Collection Time: 10/30/24 10:50 AM  Result Value Ref Range Status   Enterococcus faecalis NOT DETECTED NOT DETECTED Final   Enterococcus Faecium NOT DETECTED NOT DETECTED Final   Listeria monocytogenes NOT DETECTED NOT DETECTED Final   Staphylococcus species NOT DETECTED NOT DETECTED Final   Staphylococcus aureus (BCID) NOT DETECTED NOT DETECTED Final   Staphylococcus epidermidis NOT DETECTED NOT DETECTED Final   Staphylococcus lugdunensis NOT DETECTED NOT DETECTED Final   Streptococcus species NOT DETECTED NOT DETECTED Final   Streptococcus agalactiae NOT DETECTED NOT DETECTED Final   Streptococcus pneumoniae NOT DETECTED NOT DETECTED Final   Streptococcus pyogenes NOT DETECTED NOT DETECTED Final   A.calcoaceticus-baumannii NOT DETECTED NOT DETECTED Final   Bacteroides fragilis NOT DETECTED NOT DETECTED Final   Enterobacterales DETECTED (A) NOT DETECTED Final    Comment: Enterobacterales represent a large order of gram negative bacteria, not a single organism. CRITICAL RESULT CALLED TO, READ BACK BY AND VERIFIED WITH:  Keiarra Charon AT 0038 10/31/24 JG    Enterobacter cloacae complex NOT DETECTED NOT DETECTED Final   Escherichia coli DETECTED (A) NOT DETECTED Final    Comment: CRITICAL RESULT CALLED TO, READ BACK BY AND VERIFIED WITH:  Caeden Foots AT 0038 10/31/24 JG    Klebsiella aerogenes NOT DETECTED NOT DETECTED Final   Klebsiella oxytoca NOT DETECTED NOT DETECTED Final   Klebsiella pneumoniae NOT DETECTED NOT DETECTED Final   Proteus species NOT DETECTED NOT DETECTED Final   Salmonella species NOT DETECTED  NOT DETECTED Final   Serratia marcescens NOT DETECTED NOT DETECTED Final   Haemophilus influenzae NOT DETECTED NOT DETECTED Final   Neisseria meningitidis NOT DETECTED NOT DETECTED Final   Pseudomonas aeruginosa NOT DETECTED NOT DETECTED Final   Stenotrophomonas maltophilia NOT DETECTED NOT DETECTED Final   Candida albicans NOT DETECTED NOT DETECTED Final   Candida auris NOT DETECTED NOT DETECTED Final   Candida glabrata NOT DETECTED NOT DETECTED Final   Candida krusei NOT DETECTED NOT DETECTED Final   Candida parapsilosis NOT DETECTED NOT DETECTED Final   Candida tropicalis NOT DETECTED NOT DETECTED Final   Cryptococcus neoformans/gattii NOT DETECTED NOT DETECTED Final   CTX-M ESBL DETECTED (A) NOT DETECTED Final    Comment: CRITICAL RESULT CALLED TO, READ BACK BY AND VERIFIED WITH:  Caedyn Tassinari AT 0038 10/31/24 JG (NOTE) Extended spectrum beta-lactamase detected. Recommend a carbapenem as initial therapy.      Carbapenem resistance IMP NOT DETECTED NOT DETECTED Final   Carbapenem resistance KPC NOT DETECTED NOT DETECTED Final   Carbapenem resistance NDM NOT DETECTED NOT DETECTED Final   Carbapenem resist OXA 48 LIKE NOT DETECTED NOT DETECTED Final   Carbapenem resistance VIM NOT DETECTED NOT DETECTED Final    Comment: Performed at [facility], [address] [place], [place] [address]    BCID Results: 1 (aerobic) of 4 bottles w/ E Coli, ESBL detected.  Pt currently on Ceftriaxone  2 gm.  Name of provider contacted: LOIS Rams, NP   Changes to prescribed antibiotics required: Transition pt  to Meropenem .  Rankin CANDIE Dills, PharmD, [facility] 10/31/2024 12:41 AM

## 2024-10-31 NOTE — ED Notes (Signed)
 Full linen change and new brief applied with NT. Patient denies any concerns at this time.

## 2024-10-31 NOTE — ED Notes (Signed)
Fall bundle in place. Bed alarm activated.

## 2024-10-31 NOTE — Evaluation (Signed)
 Physical Therapy Evaluation Patient Details Name: Nathan Rivera MRN: [id number] DOB: 1946-05-19 Today's Date: 10/31/2024  History of Present Illness  79 y/o male presented to ED on 10/30/24 for weakness, high BP, and fever along with 2 episodes of unresponsiveness. Admitted with acute metabolic encephalopathy with underlying dementia and concern for sepsis with ESBL bacteremia. PMH: dementia, HTN, PAD, AAA rupture s/p repair 2025, CKD  Clinical Impression  Patient admitted with the above. PTA, patient lives with wife and was modI with RW or SPC in the home for mobility but requires assistance for ADLs from CNA that comes 2x/week. Patient presents with weakness, impaired balance, decreased activity tolerance, and impaired cognition. Oriented to self which seems to be baseline. Required maxA for bed mobility this date and stood from EOB with modA and RW. Demonstrated L lateral lean in sitting and standing. Ambulated 10' with RW and modA for RW management, balance, and constant cueing for sequencing and direction. Daughter present and supportive. Patient will benefit from skilled PT services during acute stay to address listed deficits. Patient will benefit from ongoing therapy at discharge to maximize functional independence and safety.         If plan is discharge home, recommend the following: A lot of help with walking and/or transfers;A lot of help with bathing/dressing/bathroom;Assistance with cooking/housework;Direct supervision/assist for medications management;Direct supervision/assist for financial management;Assist for transportation;Help with stairs or ramp for entrance;Supervision due to cognitive status   Can travel by private vehicle   Yes    Equipment Recommendations None recommended by PT  Recommendations for Other Services       Functional Status Assessment Patient has had a recent decline in their functional status and demonstrates the ability to make significant improvements in  function in a reasonable and predictable amount of time.     Precautions / Restrictions Precautions Precautions: Fall Recall of Precautions/Restrictions: Impaired Restrictions Weight Bearing Restrictions Per Provider Order: No      Mobility  Bed Mobility Overal bed mobility: Needs Assistance Bed Mobility: Supine to Sit, Sit to Supine     Supine to sit: Max assist Sit to supine: Mod assist        Transfers Overall transfer level: Needs assistance Equipment used: Nathan Nathan Rivera (2 wheels) Transfers: Sit to/from Stand Sit to Stand: Mod assist           General transfer comment: cues for sequencing and hand placement    Ambulation/Gait Ambulation/Gait assistance: Mod assist Gait Distance (Feet): 10 Feet Assistive device: Nathan Nathan Rivera (2 wheels) Gait Pattern/deviations: Step-to pattern, Decreased stride length, Trunk flexed Gait velocity: decreased     General Gait Details: leaning to the L. Assist for RW management, balance, and frequent cueing for sequencing/direction  Stairs            Wheelchair Mobility     Tilt Bed    Modified Rankin (Stroke Patients Only)       Balance Overall balance assessment: Needs assistance Sitting-balance support: No upper extremity supported, Feet supported Sitting balance-Nathan Rivera: Poor Sitting balance - Comments: L lateral lean in sitting Postural control: Left lateral lean Standing balance support: Bilateral upper extremity supported, During functional activity Standing balance-Nathan Rivera: Poor                               Pertinent Vitals/Pain Pain Assessment Pain Assessment: No/denies pain    Home Living Family/patient expects to be discharged to:: Private residence Living Arrangements: Spouse/significant  other Available Help at Discharge: Family;Available 24 hours/day Type of Home: House Home Access: Ramped entrance       Home Layout: Two level;Able to live on main level with  bedroom/bathroom Home Equipment: Nathan Rivera (2 wheels);Rollator (4 wheels);Cane - single point;Shower seat - built in;BSC/3in1      Prior Function Prior Level of Function : Needs assist             Mobility Comments: independent with RW or cane ADLs Comments: assistance from CNA (2x/week) for bathing, shaving, etc.     Extremity/Trunk Assessment   Upper Extremity Assessment Upper Extremity Assessment: Defer to OT evaluation    Lower Extremity Assessment Lower Extremity Assessment: Generalized weakness    Cervical / Trunk Assessment Cervical / Trunk Assessment: Kyphotic  Communication   Communication Communication: No apparent difficulties    Cognition Arousal: Alert Behavior During Therapy: Impulsive   PT - Cognitive impairments: History of cognitive impairments                       PT - Cognition Comments: oriented to self Following commands: Impaired Following commands impaired: Follows one step commands inconsistently, Follows one step commands with increased time     Cueing Cueing Techniques: Verbal cues, Tactile cues, Visual cues     General Comments      Exercises     Assessment/Plan    PT Assessment Patient needs continued PT services  PT Problem List Decreased strength;Decreased activity tolerance;Decreased balance;Decreased mobility;Decreased cognition;Decreased knowledge of precautions;Decreased knowledge of use of DME;Decreased safety awareness       PT Treatment Interventions DME instruction;Gait training;Therapeutic activities;Functional mobility training;Therapeutic exercise;Balance training;Neuromuscular re-education;Patient/family education    PT Goals (Current goals can be found in the Care Plan section)  Acute Rehab PT Goals Patient Stated Goal: did not state PT Goal Formulation: With patient/family Time For Goal Achievement: 11/14/24 Potential to Achieve Goals: Fair    Frequency Min 2X/week     Co-evaluation                AM-PAC PT 6 Clicks Mobility  Outcome Measure Help needed turning from your back to your side while in a flat bed without using bedrails?: A Lot Help needed moving from lying on your back to sitting on the side of a flat bed without using bedrails?: A Lot Help needed moving to and from a bed to a chair (including a wheelchair)?: A Lot Help needed standing up from a chair using your arms (e.g., wheelchair or bedside chair)?: A Lot Help needed to walk in hospital room?: A Lot Help needed climbing 3-5 steps with a railing? : A Lot 6 Click Score: 12    End of Session   Activity Tolerance: Patient tolerated treatment well Patient left: in bed;with call bell/phone within reach;with bed alarm set;with family/visitor present Nurse Communication: Mobility status PT Visit Diagnosis: Unsteadiness on feet (R26.81);Muscle weakness (generalized) (M62.81);Other abnormalities of gait and mobility (R26.89)    Time: 8467-8450 PT Time Calculation (min) (ACUTE ONLY): 17 min   Charges:   PT Evaluation $PT Eval Moderate Complexity: 1 Mod   PT General Charges $$ ACUTE PT VISIT: 1 Visit         Maryanne Finder, PT, DPT Physical Therapist - [facility]  [facility]   Marian Grandt A Lavonn Maxcy 10/31/2024, 3:59 PM

## 2024-11-01 DIAGNOSIS — B9629 Other Escherichia coli [E. coli] as the cause of diseases classified elsewhere: Secondary | ICD-10-CM | POA: Insufficient documentation

## 2024-11-01 DIAGNOSIS — G9341 Metabolic encephalopathy: Secondary | ICD-10-CM

## 2024-11-01 LAB — CULTURE, BLOOD (ROUTINE X 2)
Culture: NO GROWTH
Special Requests: ADEQUATE

## 2024-11-01 LAB — CBC
HCT: 29.4 % — ABNORMAL LOW (ref 39.0–52.0)
Hemoglobin: 9.5 g/dL — ABNORMAL LOW (ref 13.0–17.0)
MCH: 28.6 pg (ref 26.0–34.0)
MCHC: 32.3 g/dL (ref 30.0–36.0)
MCV: 88.6 fL (ref 80.0–100.0)
Platelets: 112 10*3/uL — ABNORMAL LOW (ref 150–400)
RBC: 3.32 MIL/uL — ABNORMAL LOW (ref 4.22–5.81)
RDW: 16.5 % — ABNORMAL HIGH (ref 11.5–15.5)
WBC: 8.6 10*3/uL (ref 4.0–10.5)
nRBC: 0 % (ref 0.0–0.2)

## 2024-11-01 LAB — BASIC METABOLIC PANEL WITH GFR
Anion gap: 11 (ref 5–15)
BUN: 55 mg/dL — ABNORMAL HIGH (ref 8–23)
CO2: 19 mmol/L — ABNORMAL LOW (ref 22–32)
Calcium: 8.4 mg/dL — ABNORMAL LOW (ref 8.9–10.3)
Chloride: 109 mmol/L (ref 98–111)
Creatinine, Ser: 4.04 mg/dL — ABNORMAL HIGH (ref 0.61–1.24)
GFR, Estimated: 14 mL/min — ABNORMAL LOW
Glucose, Bld: 85 mg/dL (ref 70–99)
Potassium: 4.3 mmol/L (ref 3.5–5.1)
Sodium: 139 mmol/L (ref 135–145)

## 2024-11-01 LAB — MAGNESIUM: Magnesium: 2.1 mg/dL (ref 1.7–2.4)

## 2024-11-01 LAB — URINE CULTURE: Culture: >=100000 — AB

## 2024-11-01 NOTE — Progress Notes (Signed)
 " Progress Note   Patient: Nathan Rivera FMW:[id number] DOB: Jan 05, 1946 DOA: 10/30/2024  DOS: the patient was seen and examined on 11/01/2024   Brief hospital course:  79 year old male with history of CKD stage IV, chronic microhemorrhages of the brain, dementia, hyperlipidemia presenting with weakness and altered mental status.  Found to have UTI.   Assessment and Plan:   Acute metabolic encephalopathy with underlying dementia - Likely exacerbated by underlying dementia as well as acute UTI and mild volume depletion.  Continues showing improvement this morning.  Patient is conversant, family at bedside.  Continue donepezil /memantine .   Concern for sepsis with ESBL E. coli bacteremia (POA) - Fever, encephalopathy, noted UTI on presentation given concern for sepsis.  Blood cultures, IV fluid bolus, empiric antibiotics initiated.  Monitoring blood pressure closely.  Blood cultures now showing E. coli with reflex panel suggesting ESBL.  Appears to be resolving.   Acute cystitis without hematuria, concern for ESBL - UA noting nitrites, LE, WBCs, bacteria, likely source of patient's bacteremia.  Patient with history of ESBL in the past.  Placed on empiric meropenem  for now (day 2 of 3-5).  Await blood/urine cultures and susceptibilities.  No lactic acidosis, leukocytosis, and afebrile since presentation.   Acute kidney injury on CKD 4 (POA) - Patient creatinine as low as 2.34 in August, up to 3.2 in October, now up to 4.39 on presentation.  4.04 this morning.  Likely prerenal etiology.  IV fluid bolus given on presentation.  Monitor urine output and will recheck BMP in AM.   HTN/HLD/GERD - Home medication regiment resumed  Physical debilitation muscle weakness - Evaluation by PT/OT.  Initial recommendation for STR.  However patient showing improvement this morning.  Will continue to reevaluate.   Subjective: Patient sitting in bedside chair, family at bedside.  Patient feeling improved.  Was  able to ambulate without assistance to the bathroom.  Getting stronger than yesterday.  Denies any fever, chills, chest pain, nausea, vomiting, abdominal pain.  Physical Exam:  Vitals:   11/01/24 0517 11/01/24 0534 11/01/24 0649 11/01/24 0836  BP: (!) 161/89 (!) 165/86 (!) 163/86 131/77  Pulse: 66 65 66 73  Resp: 17 17  17   Temp: 97.8 F (36.6 C) 97.7 F (36.5 C)  97.7 F (36.5 C)  TempSrc:  Oral  Oral  SpO2: 94% 92%  97%  Weight:      Height:        GENERAL:  Alert, pleasant, no acute distress  HEENT:  EOMI CARDIOVASCULAR:  RRR, no murmurs appreciated RESPIRATORY:  Clear to auscultation, no wheezing, rales, or rhonchi GASTROINTESTINAL:  Soft, nontender, nondistended EXTREMITIES:  No LE edema bilaterally NEURO: Memory issues, no new focal deficits appreciated SKIN:  No rashes noted PSYCH:  Appropriate mood and affect    Data Reviewed:  Imaging Studies: CT Head Wo Contrast Result Date: 10/30/2024 EXAM: CT HEAD WITHOUT CONTRAST 10/30/2024 10:17:21 AM TECHNIQUE: CT of the head was performed without the administration of intravenous contrast. Automated exposure control, iterative reconstruction, and/or weight based adjustment of the mA/kV was utilized to reduce the radiation dose to as low as reasonably achievable. COMPARISON: Brain MRI 01/29/2024. CLINICAL HISTORY: 79 year old male. New-onset seizure, no history of trauma, weakness, hypertensive, low grade fever. FINDINGS: BRAIN AND VENTRICLES: Stable brain volume. No evidence of acute infarct. No hydrocephalus. No extra-axial collection. No mass effect or midline shift. Microhemorrhages on MRI last year were primarily in the left occipital lobe. However, a focus of susceptibility on that exam (series 10  image 28) appears to correspond to small round 4 mm hyperdense lesion at the junction of the right posterior temporal and occipital lobes today. No edema or mass effect there. And confluent bilateral cerebral white matter hypodensity  appears stable from MRI signal changes last year. And a small area of cortical encephalomalacia in the left parietal lobe is stable on series 2 image 20. No acute hemorrhage. Calcified atherosclerosis at the skull base. ORBITS: Visible paranasal sinuses, tympanic cavities and mastoids are well aerated. No gaze deviation. SOFT TISSUES AND SKULL: No acute soft tissue abnormality. No skull fracture. IMPRESSION: 1. No acute intracranial abnormality. 2. Advanced chronic small vessel disease, including a chronic microhemorrhage in the posterior right hemisphere visible on both CT and prior MRI. Electronically signed by: Helayne Hurst MD 10/30/2024 10:38 AM EST RP Workstation: [id number]   DG Chest Port 1 View Result Date: 10/30/2024 CLINICAL DATA:  Hypertension, low-grade fever. Altered mental status. EXAM: PORTABLE CHEST 1 VIEW COMPARISON:  04/18/2024 and CT chest 04/15/2024. FINDINGS: Trachea is midline. Heart size stable. Lungs are somewhat low in volume with streaky densities in the lung bases. No dense airspace consolidation fluid. Right hemidiaphragm is slightly elevated unchanged. IMPRESSION: Somewhat low lung volumes with bibasilar atelectasis and/or scarring. Electronically Signed   By: Newell Eke M.D.   On: 10/30/2024 10:21    There are no new results to review at this time.  Previous records (including but not limited to H&P, progress notes, nursing notes, TOC management) were reviewed in assessment of this patient.  Labs: CBC: Recent Labs  Lab 10/30/24 0935 10/31/24 0010 11/01/24 0507  WBC 5.5 10.2 8.6  NEUTROABS 5.1  --   --   HGB 10.4* 9.1* 9.5*  HCT 32.8* 28.2* 29.4*  MCV 89.4 89.8 88.6  PLT 166 132* 112*   Basic Metabolic Panel: Recent Labs  Lab 10/30/24 0935 10/31/24 0010 11/01/24 0507  NA 144 142 139  K 4.3 4.6 4.3  CL 111 112* 109  CO2 19* 19* 19*  GLUCOSE 78 82 85  BUN 52* 52* 55*  CREATININE 4.39* 4.27* 4.04*  CALCIUM 9.0 8.5* 8.4*  MG  --   --  2.1   Liver  Function Tests: Recent Labs  Lab 10/30/24 0935  AST 14*  ALT 10  ALKPHOS 64  BILITOT 0.4  PROT 6.9  ALBUMIN  3.9   CBG: No results for input(s): GLUCAP in the last 168 hours.  Scheduled Meds:  amLODipine   10 mg Oral Daily   aspirin  EC  81 mg Oral Daily   cholecalciferol   1,000 Units Oral Daily   cyanocobalamin   500 mcg Oral Daily   donepezil   10 mg Oral QHS   ferrous sulfate   325 mg Oral Daily   gabapentin   300 mg Oral BID   heparin   5,000 Units Subcutaneous Q8H   memantine   10 mg Oral BID   pantoprazole   40 mg Oral QHS   simvastatin   20 mg Oral QHS   Continuous Infusions:  meropenem  (MERREM ) IV 500 mg (11/01/24 0216)   PRN Meds:.acetaminophen  **OR** acetaminophen , hydrALAZINE , ondansetron  **OR** ondansetron  (ZOFRAN ) IV, senna-docusate, torsemide   Family Communication: At bedside  Disposition: Status is: Inpatient Remains inpatient appropriate because: ESBL E. coli     Time spent: 36 minutes  Length of inpatient stay: 2 days  Author: Carliss LELON Canales, DO 11/01/2024 10:35 AM  For on call review [email or web address].   "

## 2024-11-01 NOTE — Care Management Important Message (Signed)
 Important Message  Patient Details  Name: Nathan Rivera MRN: [id number] Date of Birth: [birth year]   Important Message Given:  Yes - Medicare IM     Vernestine Brodhead 11/01/2024, 12:33 PM

## 2024-11-01 NOTE — Plan of Care (Signed)
   Problem: Clinical Measurements: Goal: Ability to maintain clinical measurements within normal limits will improve Outcome: Progressing

## 2024-11-01 NOTE — Evaluation (Signed)
 Occupational Therapy Evaluation Patient Details Name: Nathan Rivera MRN: [id number] DOB: December 16, 1945 Today's Date: 11/01/2024   History of Present Illness   79 y/o male presented to ED on 10/30/24 for weakness, high BP, and fever along with 2 episodes of unresponsiveness. Admitted with acute metabolic encephalopathy with underlying dementia and concern for sepsis with ESBL bacteremia. PMH: dementia, HTN, PAD, AAA rupture s/p repair 2025, CKD     Clinical Impressions Pt was seen for OT evaluation this date. Prior to hospital admission, pt was needed assist for ADLs. Pt lives with spouse. Pt presents to acute OT demonstrating impaired ADL performance and functional mobility 2/2 decreased activity tolerance and functional strength/balance deficits. Pt currently requires CGA with Min cueing to stand and Min a + cueing to sit, assist for eccentric control. [place] transfer ~21ft Min a + RW requiring mod verbal and tactile cueing for RW control. Max a for peri care standing. Mod a for LB dressing. Pt/family edu on safe use of DME and d/c recs. Pt would benefit from skilled OT to address noted impairments and functional limitations (see below for any additional details). Upon hospital discharge, recommend continued OT services to maximize pt safety and return to PLOF.      If plan is discharge home, recommend the following:   A little help with walking and/or transfers;A little help with bathing/dressing/bathroom;Assistance with cooking/housework;Help with stairs or ramp for entrance;Assist for transportation     Functional Status Assessment   Patient has had a recent decline in their functional status and demonstrates the ability to make significant improvements in function in a reasonable and predictable amount of time.     Equipment Recommendations   BSC/3in1     Recommendations for Other Services         Precautions/Restrictions   Precautions Precautions: Fall Recall of  Precautions/Restrictions: Impaired Restrictions Weight Bearing Restrictions Per Provider Order: No     Mobility Bed Mobility Overal bed mobility: Needs Assistance Bed Mobility: Sit to Supine       Sit to supine: Min assist        Transfers Overall transfer level: Needs assistance Equipment used: Rolling walker (2 wheels) Transfers: Sit to/from Stand Sit to Stand: Min assist                  Balance Overall balance assessment: Needs assistance Sitting-balance support: No upper extremity supported, Feet supported Sitting balance-Leahy Scale: Fair     Standing balance support: During functional activity, Single extremity supported Standing balance-Leahy Scale: Poor                             ADL either performed or assessed with clinical judgement   ADL Overall ADL's : Needs assistance/impaired                                       General ADL Comments: Pt CGA with Min cueing to stand and Min a and cueing to sit, eccentric control. Pt needing tacile cueing for hand placement when standing. [place] transfer ~11ft Min a + RW requirng mod verbal and tactile cueing for RW control. Max a for peri care and Mod a for LB dressing. Pt/family edu on safe use of DME and d/c recs.     Vision         Perception  Praxis         Pertinent Vitals/Pain Pain Assessment Pain Assessment: Faces Faces Pain Scale: Hurts even more Pain Location: R knee Pain Descriptors / Indicators: Aching, Discomfort, Dull Pain Intervention(s): Monitored during session     Extremity/Trunk Assessment Upper Extremity Assessment Upper Extremity Assessment: Generalized weakness   Lower Extremity Assessment Lower Extremity Assessment: Generalized weakness       Communication Communication Communication: No apparent difficulties   Cognition Arousal: Alert Behavior During Therapy: WFL for tasks assessed/performed Cognition: Cognition impaired,  History of cognitive impairments           Executive functioning impairment (select all impairments): Initiation, Sequencing                   Following commands: Impaired Following commands impaired: Follows one step commands inconsistently, Follows one step commands with increased time     Cueing  General Comments   Cueing Techniques: Verbal cues;Tactile cues;Visual cues      Exercises     Shoulder Instructions      Home Living Family/patient expects to be discharged to:: Private residence Living Arrangements: Spouse/significant other Available Help at Discharge: Family;Available 24 hours/day Type of Home: House Home Access: Ramped entrance     Home Layout: Two level;Able to live on main level with bedroom/bathroom     Bathroom Shower/Tub: Arts Development Officer Toilet: Handicapped height     Home Equipment: Agricultural Consultant (2 wheels);Rollator (4 wheels);Cane - single point;Shower seat - built in;BSC/3in1          Prior Functioning/Environment Prior Level of Function : Needs assist             Mobility Comments: independent with RW or cane ADLs Comments: assistance from CNA (2x/week) for bathing, shaving, etc.    OT Problem List: Decreased strength;Decreased activity tolerance;Impaired balance (sitting and/or standing);Decreased safety awareness;Pain   OT Treatment/Interventions: Self-care/ADL training;Therapeutic exercise;Energy conservation;Balance training;Patient/family education      OT Goals(Current goals can be found in the care plan section)   Acute Rehab OT Goals Patient Stated Goal: Go home OT Goal Formulation: With patient/family Time For Goal Achievement: 11/15/24 Potential to Achieve Goals: Good ADL Goals Pt Will Perform Upper Body Bathing: with supervision;standing Pt Will Perform Lower Body Dressing: with contact guard assist;sit to/from stand Pt Will Transfer to Toilet: with supervision;regular height  toilet;ambulating   OT Frequency:  Min 2X/week    Co-evaluation              AM-PAC OT 6 Clicks Daily Activity     Outcome Measure Help from another person eating meals?: None Help from another person taking care of personal grooming?: A Lot Help from another person toileting, which includes using toliet, bedpan, or urinal?: A Lot Help from another person bathing (including washing, rinsing, drying)?: A Lot Help from another person to put on and taking off regular upper body clothing?: A Little Help from another person to put on and taking off regular lower body clothing?: A Lot 6 Click Score: 15   End of Session Equipment Utilized During Treatment: Gait belt;Rolling walker (2 wheels)  Activity Tolerance: Patient tolerated treatment well Patient left: in bed;with call bell/phone within reach;with bed alarm set;with family/visitor present  OT Visit Diagnosis: Unsteadiness on feet (R26.81);Other abnormalities of gait and mobility (R26.89);Muscle weakness (generalized) (M62.81)                Time: 9883-9856 OT Time Calculation (min): 27 min Charges:  OT General Charges $OT  Visit: 1 Visit OT Evaluation $OT Eval Low Complexity: 1 Low OT Treatments $Self Care/Home Management : 8-22 mins  Roni Friberg OTS  Baily Serpe 11/01/2024, 2:06 PM

## 2025-03-14 ENCOUNTER — Encounter (INDEPENDENT_AMBULATORY_CARE_PROVIDER_SITE_OTHER)

## 2025-03-14 ENCOUNTER — Other Ambulatory Visit (INDEPENDENT_AMBULATORY_CARE_PROVIDER_SITE_OTHER)

## 2025-03-14 ENCOUNTER — Ambulatory Visit (INDEPENDENT_AMBULATORY_CARE_PROVIDER_SITE_OTHER): Admitting: Nurse Practitioner
